# Patient Record
Sex: Female | Born: 1970 | Race: Black or African American | Hispanic: No | Marital: Single | Smoking: Never smoker
Health system: Southern US, Community
[De-identification: ages and names within clinical notes are randomized; demographics above are authoritative.]

## PROBLEM LIST (undated history)

## (undated) DIAGNOSIS — M25569 Pain in unspecified knee: Secondary | ICD-10-CM

## (undated) DIAGNOSIS — D649 Anemia, unspecified: Secondary | ICD-10-CM

## (undated) DIAGNOSIS — J449 Chronic obstructive pulmonary disease, unspecified: Secondary | ICD-10-CM

## (undated) DIAGNOSIS — M5030 Other cervical disc degeneration, unspecified cervical region: Secondary | ICD-10-CM

## (undated) DIAGNOSIS — K219 Gastro-esophageal reflux disease without esophagitis: Secondary | ICD-10-CM

## (undated) DIAGNOSIS — G9001 Carotid sinus syncope: Secondary | ICD-10-CM

## (undated) DIAGNOSIS — R569 Unspecified convulsions: Secondary | ICD-10-CM

## (undated) DIAGNOSIS — F8081 Childhood onset fluency disorder: Secondary | ICD-10-CM

## (undated) DIAGNOSIS — E041 Nontoxic single thyroid nodule: Secondary | ICD-10-CM

## (undated) DIAGNOSIS — I10 Essential (primary) hypertension: Secondary | ICD-10-CM

## (undated) DIAGNOSIS — J45909 Unspecified asthma, uncomplicated: Secondary | ICD-10-CM

## (undated) HISTORY — DX: Chronic obstructive pulmonary disease, unspecified: J44.9

## (undated) HISTORY — PX: TUBAL LIGATION: SHX77

## (undated) HISTORY — DX: Childhood onset fluency disorder: F80.81

## (undated) HISTORY — PX: WISDOM TOOTH EXTRACTION: SHX21

## (undated) HISTORY — PX: KNEE ARTHROSCOPY: SUR90

---

## 2002-06-17 ENCOUNTER — Inpatient Hospital Stay (HOSPITAL_COMMUNITY): Admission: EM | Admit: 2002-06-17 | Discharge: 2002-06-21 | Payer: Self-pay | Admitting: Emergency Medicine

## 2002-06-17 ENCOUNTER — Encounter: Payer: Self-pay | Admitting: Emergency Medicine

## 2004-03-22 ENCOUNTER — Ambulatory Visit: Payer: Self-pay | Admitting: Family Medicine

## 2004-06-26 ENCOUNTER — Emergency Department (HOSPITAL_COMMUNITY): Admission: EM | Admit: 2004-06-26 | Discharge: 2004-06-26 | Payer: Self-pay | Admitting: Emergency Medicine

## 2004-07-20 ENCOUNTER — Ambulatory Visit: Payer: Self-pay | Admitting: Family Medicine

## 2004-10-27 ENCOUNTER — Ambulatory Visit: Payer: Self-pay | Admitting: Family Medicine

## 2005-01-24 ENCOUNTER — Emergency Department (HOSPITAL_COMMUNITY): Admission: EM | Admit: 2005-01-24 | Discharge: 2005-01-24 | Payer: Self-pay | Admitting: Emergency Medicine

## 2005-02-26 ENCOUNTER — Ambulatory Visit: Payer: Self-pay | Admitting: Family Medicine

## 2006-06-05 ENCOUNTER — Emergency Department (HOSPITAL_COMMUNITY): Admission: EM | Admit: 2006-06-05 | Discharge: 2006-06-05 | Payer: Self-pay | Admitting: Emergency Medicine

## 2006-06-07 ENCOUNTER — Ambulatory Visit: Payer: Self-pay | Admitting: Family Medicine

## 2006-06-11 ENCOUNTER — Ambulatory Visit: Payer: Self-pay | Admitting: Family Medicine

## 2007-06-25 ENCOUNTER — Emergency Department (HOSPITAL_COMMUNITY): Admission: EM | Admit: 2007-06-25 | Discharge: 2007-06-25 | Payer: Self-pay | Admitting: Emergency Medicine

## 2008-11-23 ENCOUNTER — Emergency Department (HOSPITAL_COMMUNITY): Admission: EM | Admit: 2008-11-23 | Discharge: 2008-11-23 | Payer: Self-pay | Admitting: Emergency Medicine

## 2009-01-07 ENCOUNTER — Emergency Department (HOSPITAL_COMMUNITY): Admission: EM | Admit: 2009-01-07 | Discharge: 2009-01-07 | Payer: Self-pay | Admitting: Emergency Medicine

## 2009-08-03 ENCOUNTER — Emergency Department (HOSPITAL_COMMUNITY): Admission: EM | Admit: 2009-08-03 | Discharge: 2009-08-03 | Payer: Self-pay | Admitting: Emergency Medicine

## 2009-08-05 ENCOUNTER — Emergency Department (HOSPITAL_COMMUNITY): Admission: EM | Admit: 2009-08-05 | Discharge: 2009-08-05 | Payer: Self-pay | Admitting: Emergency Medicine

## 2009-08-07 ENCOUNTER — Emergency Department (HOSPITAL_COMMUNITY): Admission: EM | Admit: 2009-08-07 | Discharge: 2009-08-07 | Payer: Self-pay | Admitting: Emergency Medicine

## 2009-09-02 ENCOUNTER — Emergency Department (HOSPITAL_COMMUNITY): Admission: EM | Admit: 2009-09-02 | Discharge: 2009-09-02 | Payer: Self-pay | Admitting: Emergency Medicine

## 2009-09-03 ENCOUNTER — Encounter: Payer: Self-pay | Admitting: Cardiology

## 2009-09-03 ENCOUNTER — Emergency Department (HOSPITAL_COMMUNITY): Admission: EM | Admit: 2009-09-03 | Discharge: 2009-09-03 | Payer: Self-pay | Admitting: Emergency Medicine

## 2009-11-24 ENCOUNTER — Emergency Department (HOSPITAL_COMMUNITY): Admission: EM | Admit: 2009-11-24 | Discharge: 2009-11-24 | Payer: Self-pay | Admitting: Emergency Medicine

## 2009-11-26 ENCOUNTER — Emergency Department (HOSPITAL_COMMUNITY): Admission: EM | Admit: 2009-11-26 | Discharge: 2009-11-26 | Payer: Self-pay | Admitting: Emergency Medicine

## 2009-11-28 ENCOUNTER — Emergency Department (HOSPITAL_COMMUNITY): Admission: EM | Admit: 2009-11-28 | Discharge: 2009-11-28 | Payer: Self-pay | Admitting: Emergency Medicine

## 2010-01-30 ENCOUNTER — Encounter: Payer: Self-pay | Admitting: Cardiology

## 2010-01-30 ENCOUNTER — Emergency Department (HOSPITAL_COMMUNITY): Admission: EM | Admit: 2010-01-30 | Discharge: 2010-01-31 | Payer: Self-pay | Admitting: Emergency Medicine

## 2010-01-31 ENCOUNTER — Encounter: Payer: Self-pay | Admitting: Cardiology

## 2010-02-02 ENCOUNTER — Encounter: Payer: Self-pay | Admitting: Cardiology

## 2010-03-01 DIAGNOSIS — I10 Essential (primary) hypertension: Secondary | ICD-10-CM | POA: Insufficient documentation

## 2010-03-02 ENCOUNTER — Ambulatory Visit: Payer: Self-pay | Admitting: Cardiology

## 2010-03-02 ENCOUNTER — Encounter (INDEPENDENT_AMBULATORY_CARE_PROVIDER_SITE_OTHER): Payer: Self-pay | Admitting: *Deleted

## 2010-03-02 DIAGNOSIS — R079 Chest pain, unspecified: Secondary | ICD-10-CM | POA: Insufficient documentation

## 2010-03-02 DIAGNOSIS — R55 Syncope and collapse: Secondary | ICD-10-CM | POA: Insufficient documentation

## 2010-03-03 ENCOUNTER — Encounter: Payer: Self-pay | Admitting: Cardiology

## 2010-03-03 ENCOUNTER — Ambulatory Visit: Payer: Self-pay | Admitting: Cardiology

## 2010-03-08 ENCOUNTER — Emergency Department (HOSPITAL_COMMUNITY): Admission: EM | Admit: 2010-03-08 | Discharge: 2010-03-08 | Payer: Self-pay | Admitting: Emergency Medicine

## 2010-03-23 ENCOUNTER — Emergency Department (HOSPITAL_COMMUNITY): Admission: EM | Admit: 2010-03-23 | Discharge: 2010-03-23 | Payer: Self-pay | Admitting: Emergency Medicine

## 2010-03-27 ENCOUNTER — Ambulatory Visit: Payer: Self-pay | Admitting: Cardiology

## 2010-04-24 ENCOUNTER — Encounter: Payer: Self-pay | Admitting: Cardiology

## 2010-05-02 ENCOUNTER — Ambulatory Visit: Payer: Self-pay | Admitting: Cardiology

## 2010-06-13 NOTE — Letter (Signed)
Summary: Pharmacist, community at [facility]. [address]   [place], [place] [address]   Phone: [phone]  Fax: [phone]      [facility] Cardiovascular Services  Cardiolite Stress Test     Brittany Durham  Your doctor has ordered a Cardiolite Stress Test to help determine the condition of your heart during stress. If you take blood pressure medicine, ask your doctor if you should take it the day of your test. You should not have anything to eat or drink at least 4 hours before your test is scheduled, and no caffeine (coffee, tea, decaf. or chocolate) for 24 hours before your test.  You will need to register at the Outpatient/Main Entrance at the hospital 30 minutes before your appointment time. It is a good idea to bring a copy of your order with you. They will direct you to the Diagnostic Imaging (Radiology) Department.  You will be asked to undress from the waist up and be given a hospital gown to wear, so dress comfortably from the waist down, for example:    Sweat pants, shorts or skirt   Rubber-soled lace up shoes (i.e. tennis shoes)  Plan on about three hours from registration to release from the hospital.    Date of Test:              Time of Test              Hold Metoprolol for 24 hours before test. You may take your other medications with water the morning of your test.

## 2010-06-13 NOTE — Assessment & Plan Note (Signed)
Summary: 3 WK F/U PER 10/20 OV-JM   Visit Type:  Follow-up Primary Provider:  Dr. Joette Catching   History of Present Illness: 40 year old woman presents for followup. She was seen back in October and referred for cardiac structural and ischemic testing, reviewed below. She had been experiencing intermittent episodes of "sharp" chest pain, fairly sudden in onset, and also with brief "pass out" spells. Since that time she has had one additional episode of chest pain, although no syncope. Her cardiac testing was quite reassuring. I discussed this with her today. We also mentioned the possibility of cardiac monitoring at the last visit to exclude any significant arrhythmias. She states that she was just recently seen in the emergency department on Friday related to her current symptoms.  Preventive Screening-Counseling & Management  Alcohol-Tobacco     Smoking Status: quit     Year Quit: 1991  Current Medications (verified): 1)  Lisinopril 20 Mg Tabs (Lisinopril) .... Take 1 Tablet By Mouth Once A Day 2)  Metoprolol Succinate 25 Mg Xr24h-Tab (Metoprolol Succinate) .... Take 1 Tablet By Mouth Once A Day 3)  Norco 5-325 Mg Tabs (Hydrocodone-Acetaminophen) .... As Needed  Allergies (verified): No Known Drug Allergies  Comments:  Nurse/Medical Assistant: The patient's medication bottles and allergies were reviewed with the patient and were updated in the Medication and Allergy Lists.  Past History:  Past Medical History: Last updated: 03/02/2010 HTN GERD Asthma UTI  Social History: Last updated: 03/02/2010 Alcohol Use - no Drug Use - no Has two daughters Full Time - [employer]  Review of Systems  The patient denies anorexia, fever, chest pain, dyspnea on exertion, peripheral edema, prolonged cough, headaches, melena, hematochezia, and severe indigestion/heartburn.         Otherwise reviewed and negative.  Vital Signs:  Patient profile:   40 year old female Height:      67  inches Weight:      182 pounds Pulse rate:   83 / minute BP sitting:   144 / 93  (left arm) Cuff size:   regular  Vitals Entered By: Carlye Grippe (March 27, 2010 2:33 PM)  Physical Exam  Additional Exam:  Mildly overweight woman in no acute distress. HEENT: Conjunctiva and lids normal, oropharynx clear. Neck: Supple, no elevated JVP or bruits. Lungs: Clear to auscultation, nonlabored. Cardiac: Regular rate and rhythm, possible soft S4, no pathologic systolic murmur or pericardial rub, no S3. Abdomen: Soft, nontender, bowel sounds present. Skin: Warm and dry. Musculoskeletal: No kyphosis. Extremity: No pitting edema. Distal pulses full. Neuropsychiatric: Alert and oriented x3, affect appropriate.    Echocardiogram  Procedure date:  03/03/2010  Findings:      LVEF 55-60% with no regional wall motion abnormalities, no significant valvular abnormalities.  Nuclear Study  Procedure date:  03/03/2010  Findings:      Exercise Cardiolite with no diagnostic ST segment changes or chest pain. Perfusion imaging was normal without evidence of scar or ischemia, LVEF 54%.  Impression & Recommendations:  Problem # 1:  CHEST PAIN UNSPECIFIED (ICD-786.50)  Atypical, recurrent however. Recent cardiac testing was reassuring showing normal left ventricular systolic function without major valvular abnormalities, and no evidence of ischemia. Possibility of a sudden onset arrhythmia is still be considered, particularly in light of her prior reported episodes of "pass out" spells. An event recorder will be arranged and I will see her back to go over these results.  Her updated medication list for this problem includes:    Lisinopril 20 Mg Tabs (  Lisinopril) .Marland Kitchen... Take 1 tablet by mouth once a day    Metoprolol Succinate 25 Mg Xr24h-tab (Metoprolol succinate) .Marland Kitchen... Take 1 tablet by mouth once a day  Orders: Cardionet/Event Monitor (Cardionet/Event)  Problem # 2:  HYPERTENSION, BENIGN  (ICD-401.1)  Blood pressure elevated today. She reports followup pending with Dr. Lysbeth Galas later this month.  Her updated medication list for this problem includes:    Lisinopril 20 Mg Tabs (Lisinopril) .Marland Kitchen... Take 1 tablet by mouth once a day    Metoprolol Succinate 25 Mg Xr24h-tab (Metoprolol succinate) .Marland Kitchen... Take 1 tablet by mouth once a day  Patient Instructions: 1)  Follow up with Dr. Diona Browner on Tuesday, May 02, 2010 at 2pm. 2)  Your physician has recommended that you wear an event monitor.  Event monitors are medical devices that record the heart's electrical activity. Doctors most often use these monitors to diagnose arrhythmias. Arrhythmias are problems with the speed or rhythm of the heartbeat. The monitor is a small, portable device. You can wear one while you do your normal daily activities. This is usually used to diagnose what is causing palpitations/syncope (passing out). 3)  Your physician recommends that you continue on your current medications as directed. Please refer to the Current Medication list given to you today.

## 2010-06-13 NOTE — Assessment & Plan Note (Signed)
Summary: NP-CHEST PAIN -STRESS TEST   Visit Type:  Initial Consult Primary Provider:  Benetta Spar, NP [facility])   History of Present Illness: 40 year old woman referred for cardiology consultation. She reports a one year history of episodic chest pain, dyspnea, and brief "passouts." Chest pain is described as "sharp," moderate to severe, sporatic, not clearly exertional. No baseline dyspnea on exertion. Uses an over the counter MDI for asthma. The possible syncopal spells are more difficult to characterize - fairly sudden, no obvious palpitations.  Records indicate that she was seen in the [facility] ED in September with chest pain. Cardiac markers normal and chest CT noted below without acute findings. ECG from 19 September reviewed showing sinus rhythm with PAC's and nonspecific ST changes.  She has not undergone any formal ischemic testing or structural cardiac assessment as yet. We discussed this today.    Preventive Screening-Counseling & Management  Alcohol-Tobacco     Smoking Status: quit     Year Quit: 1991  Current Medications (verified): 1)  Lisinopril 20 Mg Tabs (Lisinopril) .... Take 1 Tablet By Mouth Once A Day 2)  Metoprolol Succinate 25 Mg Xr24h-Tab (Metoprolol Succinate) .... Take 1 Tablet By Mouth Once A Day  Allergies (verified): No Known Drug Allergies  Past History:  Family History: Last updated: 03/02/2010 Noncontributory  Social History: Last updated: 03/02/2010 Alcohol Use - no Drug Use - no Has two daughters Full Time - [employer]  Past Medical History: HTN GERD Asthma UTI  Past Surgical History: Tubal ligation Foot surgery  Family History: Noncontributory  Social History: Alcohol Use - no Drug Use - no Has two daughters Full Time - [employer] Smoking Status:  quit  Review of Systems  The patient denies anorexia, fever, weight loss, dyspnea on exertion, peripheral edema, prolonged cough, headaches, hemoptysis, abdominal pain, melena, hematochezia,  and severe indigestion/heartburn.         Otherwise reviewed and negative except as outlined above.  Vital Signs:  Patient profile:   40 year old female Height:      67 inches Weight:      181 pounds BMI:     28.45 Pulse rate:   91 / minute BP sitting:   147 / 96  (left arm) Cuff size:   large  Vitals Entered By: Carlye Grippe (March 02, 2010 1:45 PM)  Nutrition Counseling: Patient's BMI is greater than 25 and therefore counseled on weight management options.  Physical Exam  Additional Exam:  Mildly overweight woman in no acute distress. HEENT: Conjunctiva and lids normal, oropharynx clear. Neck: Supple, no elevated JVP or bruits. Lungs: Clear to auscultation, nonlabored. Cardiac: Regular rate and rhythm, possible soft S4, no pathologic systolic murmur or pericardial rub, no S3. Abdomen: Soft, nontender, bowel sounds present. Skin: Warm and dry. Musculoskeletal: No kyphosis. Extremity: No pitting edema. Distal pulses full. Neuropsychiatric: Alert and oriented x3, affect appropriate.    CT Scan  Procedure date:  01/31/2010  Findings:       Findings:  There is no evidence of pulmonary embolus.    Minimal bibasilar atelectasis is noted.  The lungs are otherwise   clear.  There is no evidence of significant focal consolidation,   pleural effusion or pneumothorax.  No masses are identified; no   abnormal focal contrast enhancement is seen.    The mediastinum is unremarkable in appearance.  Residual thymic   tissue is within normal limits.  The great vessels are unremarkable   in appearance.  No mediastinal lymphadenopathy  is seen.  No   pericardial effusion is identified.  No axillary lymphadenopathy is   seen.  The thyroid gland is unremarkable in appearance.    The visualized portions of the liver and spleen are unremarkable.   The visualized portions of the pancreas, gallbladder, adrenal   glands and kidneys are within normal limits. A tiny hiatal hernia   is  noted.    No acute osseous abnormalities are seen.    Review of the MIP images confirms the above findings.    IMPRESSION:    1.  No evidence of pulmonary embolus.   2.  Minimal bibasilar atelectasis; lungs otherwise clear.   3.  Tiny hiatal hernia noted.   Impression & Recommendations:  Problem # 1:  CHEST PAIN UNSPECIFIED (ICD-786.50)  One-year history of recurrent chest pain, also sensation of shortness of breath, not necessarily exertional, as described above. Assessment to date has been reassuring, including baseline ECG, cardiac markers, CT angiography of the chest. She has not however undergone any formal ischemic testing or structural cardiac assessment. We discussed this today, and plan an exercise Cardiolite, off metoprolol, with 2-D echocardiogram as well. I will bring her back to the office to discuss the results.  Her updated medication list for this problem includes:    Lisinopril 20 Mg Tabs (Lisinopril) .Marland Kitchen... Take 1 tablet by mouth once a day    Metoprolol Succinate 25 Mg Xr24h-tab (Metoprolol succinate) .Marland Kitchen... Take 1 tablet by mouth once a day  Orders: 2-D Echocardiogram (2D Echo) Nuclear Med (Nuc Med)  Problem # 2:  SYNCOPE (ICD-780.2)  Possible syncope, outlined above. Description of events is somewhat vague. Further cardiac structural and ischemic testing is pending. We may need to consider an event monitor.  Her updated medication list for this problem includes:    Lisinopril 20 Mg Tabs (Lisinopril) .Marland Kitchen... Take 1 tablet by mouth once a day    Metoprolol Succinate 25 Mg Xr24h-tab (Metoprolol succinate) .Marland Kitchen... Take 1 tablet by mouth once a day  Her updated medication list for this problem includes:    Lisinopril 20 Mg Tabs (Lisinopril) .Marland Kitchen... Take 1 tablet by mouth once a day    Metoprolol Succinate 25 Mg Xr24h-tab (Metoprolol succinate) .Marland Kitchen... Take 1 tablet by mouth once a day  Orders: 2-D Echocardiogram (2D Echo) Nuclear Med (Nuc Med)  Problem # 3:   HYPERTENSION, BENIGN (ICD-401.1)  Blood pressure elevated today. Medications are reviewed. May need further medication titration. Sodium restriction. Continue followup with primary care provider.  Her updated medication list for this problem includes:    Lisinopril 20 Mg Tabs (Lisinopril) .Marland Kitchen... Take 1 tablet by mouth once a day    Metoprolol Succinate 25 Mg Xr24h-tab (Metoprolol succinate) .Marland Kitchen... Take 1 tablet by mouth once a day  Patient Instructions: 1)  Follow up with Dr. Diona Browner on Monday, March 27, 2010 at 2:40pm.  2)  Your physician has requested that you have an echocardiogram.  Echocardiography is a painless test that uses sound waves to create images of your heart. It provides your doctor with information about the size and shape of your heart and how well your heart's chambers and valves are working.  This procedure takes approximately one hour. There are no restrictions for this procedure. 3)  Your physician has requested that you have an exercise stress cardiolite.  For further information please visit [email or web address]  Please follow instruction sheet, as given.

## 2010-06-13 NOTE — Letter (Signed)
Summary: External Correspondence/ [facility]  External Correspondence/ [facility]   Imported By: Dorise Hiss 02/07/2010 15:50:10  _____________________________________________________________________  External Attachment:    Type:   Image     Comment:   External Document

## 2010-06-15 NOTE — Assessment & Plan Note (Signed)
Summary: 1 MONTH F/U PER 11/14 OV-JM   Visit Type:  Follow-up Primary Provider:  Dr. Joette Catching   History of Present Illness: 40 year old woman presents for follow-up. She was seen back in November. She reports no progressive chest pain, and no episodes of syncope.  Cardiac monitor was ordered to further assess possible syncopal events. No significant arrhythmias were noted. We discussed this today.  I reviewed with her the results of her stress test, showing reassuring findings. Plan at this point will be to continue regular followup with Dr. Lysbeth Galas.  We can see her back as needed.  Preventive Screening-Counseling & Management  Alcohol-Tobacco     Smoking Status: quit     Year Quit: 1991  Current Medications (verified): 1)  Lisinopril 20 Mg Tabs (Lisinopril) .... Take 1 Tablet By Mouth Once A Day 2)  Metoprolol Succinate 25 Mg Xr24h-Tab (Metoprolol Succinate) .... Take 1 Tablet By Mouth Once A Day 3)  Norco 5-325 Mg Tabs (Hydrocodone-Acetaminophen) .... As Needed  Allergies (verified): No Known Drug Allergies  Comments:  Nurse/Medical Assistant: The patient's medication bottles and allergies were reviewed with the patient and were updated in the Medication and Allergy Lists.  Past History:  Past Medical History: Last updated: 03/02/2010 HTN GERD Asthma UTI  Social History: Last updated: 03/02/2010 Alcohol Use - no Drug Use - no Has two daughters Full Time - [employer]  Review of Systems  The patient denies anorexia, fever, weight loss, syncope, dyspnea on exertion, peripheral edema, melena, and hematochezia.         Otherwise reviewed and negative.  Vital Signs:  Patient profile:   40 year old female Height:      67 inches Weight:      186 pounds Pulse rate:   82 / minute BP sitting:   134 / 94  (left arm) Cuff size:   regular  Vitals Entered By: Carlye Grippe (May 02, 2010 2:02 PM)  Physical Exam  Additional Exam:  Mildly overweight woman in no  acute distress. HEENT: Conjunctiva and lids normal, oropharynx clear. Neck: Supple, no elevated JVP or bruits. Lungs: Clear to auscultation, nonlabored. Cardiac: Regular rate and rhythm, possible soft S4, no pathologic systolic murmur or pericardial rub, no S3. Abdomen: Soft, nontender, bowel sounds present. Skin: Warm and dry. Extremities: No pitting.    Prior Report Reviewed for Nuclear Study:  Findings: 03/03/2010 Exercise Cardiolite with no diagnostic ST segment changes or chest pain. Perfusion imaging was normal without evidence of scar or ischemia, LVEF 54%.  Impression & Recommendations:  Problem # 1:  SYNCOPE (ICD-780.2)  Not recurrent. Cardiac monitoring did not reveal any definite dysrhythmias, patient states she had no symptoms while wearing the monitor. I asked her to stay observant for any changes, otherwise followup with Dr. Lysbeth Galas. We can see her back as needed.  Her updated medication list for this problem includes:    Lisinopril 20 Mg Tabs (Lisinopril) .Marland Kitchen... Take 1 tablet by mouth once a day    Metoprolol Succinate 25 Mg Xr24h-tab (Metoprolol succinate) .Marland Kitchen... Take 1 tablet by mouth once a day  Problem # 2:  CHEST PAIN UNSPECIFIED (ICD-786.50)  Normal stress testing as outlined above.  Her updated medication list for this problem includes:    Lisinopril 20 Mg Tabs (Lisinopril) .Marland Kitchen... Take 1 tablet by mouth once a day    Metoprolol Succinate 25 Mg Xr24h-tab (Metoprolol succinate) .Marland Kitchen... Take 1 tablet by mouth once a day  Patient Instructions: 1)  Follow up as needed.

## 2010-06-15 NOTE — Procedures (Signed)
Summary: Event Monitor-final summary  Event Monitor-final summary   Imported By: Cyril Loosen, RN, BSN 04/24/2010 10:18:51  _____________________________________________________________________  External Attachment:    Type:   Image     Comment:   External Document  Appended Document: Event Monitor-final summary Pt will be notified of results at office visit on 12/20 with Dr. Diona Browner.

## 2010-06-19 ENCOUNTER — Emergency Department (HOSPITAL_COMMUNITY): Payer: Medicaid Other

## 2010-06-19 ENCOUNTER — Emergency Department (HOSPITAL_COMMUNITY)
Admission: EM | Admit: 2010-06-19 | Discharge: 2010-06-19 | Disposition: A | Discharge status: Home / Self Care | Payer: Medicaid Other | Attending: Emergency Medicine | Admitting: Emergency Medicine

## 2010-06-19 DIAGNOSIS — I10 Essential (primary) hypertension: Secondary | ICD-10-CM | POA: Insufficient documentation

## 2010-06-19 DIAGNOSIS — R079 Chest pain, unspecified: Secondary | ICD-10-CM | POA: Insufficient documentation

## 2010-06-19 LAB — DIFFERENTIAL
Basophils Absolute: 0 10*3/uL (ref 0.0–0.1)
Basophils Relative: 0 % (ref 0–1)
Eosinophils Absolute: 0 10*3/uL (ref 0.0–0.7)
Eosinophils Relative: 1 % (ref 0–5)
Lymphocytes Relative: 32 % (ref 12–46)
Lymphs Abs: 1.7 10*3/uL (ref 0.7–4.0)
Monocytes Absolute: 0.3 10*3/uL (ref 0.1–1.0)
Monocytes Relative: 6 % (ref 3–12)
Neutro Abs: 3.2 10*3/uL (ref 1.7–7.7)
Neutrophils Relative %: 61 % (ref 43–77)

## 2010-06-19 LAB — BASIC METABOLIC PANEL
BUN: 9 mg/dL (ref 6–23)
CO2: 26 mEq/L (ref 19–32)
Calcium: 9.6 mg/dL (ref 8.4–10.5)
Chloride: 105 mEq/L (ref 96–112)
Creatinine, Ser: 0.68 mg/dL (ref 0.4–1.2)
GFR calc Af Amer: >60 mL/min (ref >60)
GFR calc non Af Amer: >60 mL/min (ref >60)
Glucose, Bld: 93 mg/dL (ref 70–99)
Potassium: 3.5 mEq/L (ref 3.5–5.1)
Sodium: 139 mEq/L (ref 135–145)

## 2010-06-19 LAB — CBC
HCT: 34.7 % — ABNORMAL LOW (ref 36.0–46.0)
Hemoglobin: 12.3 g/dL (ref 12.0–15.0)
MCH: 31.1 pg (ref 26.0–34.0)
MCHC: 35.4 g/dL (ref 30.0–36.0)
MCV: 87.6 fL (ref 78.0–100.0)
Platelets: 180 10*3/uL (ref 150–400)
RBC: 3.96 MIL/uL (ref 3.87–5.11)
RDW: 12.2 % (ref 11.5–15.5)
WBC: 5.3 10*3/uL (ref 4.0–10.5)

## 2010-06-19 LAB — POCT CARDIAC MARKERS
CKMB, poc: 3.5 ng/mL (ref 1.0–8.0)
Myoglobin, poc: 95.8 ng/mL (ref 12–200)
Troponin i, poc: <0.05 ng/mL (ref 0.00–0.09)

## 2010-07-25 LAB — BASIC METABOLIC PANEL WITH GFR
BUN: 17 mg/dL (ref 6–23)
CO2: 27 meq/L (ref 19–32)
Calcium: 9.5 mg/dL (ref 8.4–10.5)
Chloride: 105 meq/L (ref 96–112)
Creatinine, Ser: 0.74 mg/dL (ref 0.4–1.2)
GFR calc non Af Amer: >60 mL/min
Glucose, Bld: 86 mg/dL (ref 70–99)
Potassium: 4.3 meq/L (ref 3.5–5.1)
Sodium: 141 meq/L (ref 135–145)

## 2010-07-25 LAB — DIFFERENTIAL
Basophils Absolute: 0 10*3/uL (ref 0.0–0.1)
Basophils Relative: 1 % (ref 0–1)
Eosinophils Absolute: 0.1 10*3/uL (ref 0.0–0.7)
Eosinophils Relative: 1 % (ref 0–5)
Lymphocytes Relative: 40 % (ref 12–46)
Lymphs Abs: 2.6 10*3/uL (ref 0.7–4.0)
Monocytes Absolute: 0.4 10*3/uL (ref 0.1–1.0)
Monocytes Relative: 7 % (ref 3–12)
Neutro Abs: 3.3 10*3/uL (ref 1.7–7.7)
Neutrophils Relative %: 51 % (ref 43–77)

## 2010-07-25 LAB — CBC
HCT: 33.7 % — ABNORMAL LOW (ref 36.0–46.0)
Hemoglobin: 11.5 g/dL — ABNORMAL LOW (ref 12.0–15.0)
MCH: 31.2 pg (ref 26.0–34.0)
MCHC: 34.1 g/dL (ref 30.0–36.0)
MCV: 91.5 fL (ref 78.0–100.0)
Platelets: 184 K/uL (ref 150–400)
RBC: 3.69 MIL/uL — ABNORMAL LOW (ref 3.87–5.11)
RDW: 12.7 % (ref 11.5–15.5)
WBC: 6.5 K/uL (ref 4.0–10.5)

## 2010-07-25 LAB — POCT CARDIAC MARKERS
CKMB, poc: 1.9 ng/mL (ref 1.0–8.0)
CKMB, poc: 2.2 ng/mL (ref 1.0–8.0)
Myoglobin, poc: 48.6 ng/mL (ref 12–200)
Myoglobin, poc: 63.2 ng/mL (ref 12–200)
Troponin i, poc: <0.05 ng/mL (ref 0.00–0.09)
Troponin i, poc: <0.05 ng/mL (ref 0.00–0.09)

## 2010-07-26 LAB — DIFFERENTIAL
Basophils Absolute: 0 10*3/uL (ref 0.0–0.1)
Basophils Relative: 0 % (ref 0–1)
Eosinophils Absolute: 0.1 10*3/uL (ref 0.0–0.7)
Eosinophils Relative: 1 % (ref 0–5)
Lymphocytes Relative: 26 % (ref 12–46)
Lymphs Abs: 1.7 10*3/uL (ref 0.7–4.0)
Monocytes Absolute: 0.5 10*3/uL (ref 0.1–1.0)
Monocytes Relative: 8 % (ref 3–12)
Neutro Abs: 4.3 10*3/uL (ref 1.7–7.7)
Neutrophils Relative %: 66 % (ref 43–77)

## 2010-07-26 LAB — CBC
HCT: 33.7 % — ABNORMAL LOW (ref 36.0–46.0)
Hemoglobin: 11.7 g/dL — ABNORMAL LOW (ref 12.0–15.0)
MCH: 31.5 pg (ref 26.0–34.0)
MCHC: 34.6 g/dL (ref 30.0–36.0)
MCV: 91.1 fL (ref 78.0–100.0)
Platelets: 170 10*3/uL (ref 150–400)
RBC: 3.7 MIL/uL — ABNORMAL LOW (ref 3.87–5.11)
RDW: 12.3 % (ref 11.5–15.5)
WBC: 6.5 10*3/uL (ref 4.0–10.5)

## 2010-07-26 LAB — URINALYSIS, ROUTINE W REFLEX MICROSCOPIC
Bilirubin Urine: NEGATIVE
Glucose, UA: NEGATIVE mg/dL
Ketones, ur: NEGATIVE mg/dL
Leukocytes, UA: NEGATIVE
Nitrite: NEGATIVE
Protein, ur: NEGATIVE mg/dL
Specific Gravity, Urine: 1.01 (ref 1.005–1.030)
Urobilinogen, UA: 0.2 mg/dL (ref 0.0–1.0)
pH: 6.5 (ref 5.0–8.0)

## 2010-07-26 LAB — COMPREHENSIVE METABOLIC PANEL
ALT: 17 U/L (ref 0–35)
AST: 20 U/L (ref 0–37)
Albumin: 4.1 g/dL (ref 3.5–5.2)
Alkaline Phosphatase: 51 U/L (ref 39–117)
BUN: 8 mg/dL (ref 6–23)
CO2: 24 mEq/L (ref 19–32)
Calcium: 9.5 mg/dL (ref 8.4–10.5)
Chloride: 108 mEq/L (ref 96–112)
Creatinine, Ser: 0.64 mg/dL (ref 0.4–1.2)
GFR calc Af Amer: >60 mL/min (ref >60)
GFR calc non Af Amer: >60 mL/min (ref >60)
Glucose, Bld: 101 mg/dL — ABNORMAL HIGH (ref 70–99)
Potassium: 3.7 mEq/L (ref 3.5–5.1)
Sodium: 140 mEq/L (ref 135–145)
Total Bilirubin: 0.6 mg/dL (ref 0.3–1.2)
Total Protein: 7.6 g/dL (ref 6.0–8.3)

## 2010-07-26 LAB — PREGNANCY, URINE: Preg Test, Ur: NEGATIVE

## 2010-07-26 LAB — URINE MICROSCOPIC-ADD ON

## 2010-07-27 LAB — DIFFERENTIAL
Basophils Absolute: 0 10*3/uL (ref 0.0–0.1)
Basophils Relative: 0 % (ref 0–1)
Eosinophils Absolute: 0.1 10*3/uL (ref 0.0–0.7)
Eosinophils Relative: 1 % (ref 0–5)
Lymphocytes Relative: 27 % (ref 12–46)
Lymphs Abs: 2 10*3/uL (ref 0.7–4.0)
Monocytes Absolute: 0.5 10*3/uL (ref 0.1–1.0)
Monocytes Relative: 6 % (ref 3–12)
Neutro Abs: 4.9 10*3/uL (ref 1.7–7.7)
Neutrophils Relative %: 66 % (ref 43–77)

## 2010-07-27 LAB — BASIC METABOLIC PANEL
BUN: 14 mg/dL (ref 6–23)
CO2: 25 mEq/L (ref 19–32)
Calcium: 9.3 mg/dL (ref 8.4–10.5)
Chloride: 110 mEq/L (ref 96–112)
Creatinine, Ser: 0.59 mg/dL (ref 0.4–1.2)
GFR calc Af Amer: >60 mL/min (ref >60)
GFR calc non Af Amer: >60 mL/min (ref >60)
Glucose, Bld: 102 mg/dL — ABNORMAL HIGH (ref 70–99)
Potassium: 3.7 mEq/L (ref 3.5–5.1)
Sodium: 140 mEq/L (ref 135–145)

## 2010-07-27 LAB — CBC
HCT: 32.6 % — ABNORMAL LOW (ref 36.0–46.0)
Hemoglobin: 11.6 g/dL — ABNORMAL LOW (ref 12.0–15.0)
MCH: 31.4 pg (ref 26.0–34.0)
MCHC: 35.6 g/dL (ref 30.0–36.0)
MCV: 88.1 fL (ref 78.0–100.0)
Platelets: 196 10*3/uL (ref 150–400)
RBC: 3.7 MIL/uL — ABNORMAL LOW (ref 3.87–5.11)
RDW: 12.2 % (ref 11.5–15.5)
WBC: 7.5 10*3/uL (ref 4.0–10.5)

## 2010-07-27 LAB — POCT CARDIAC MARKERS
CKMB, poc: 2 ng/mL (ref 1.0–8.0)
CKMB, poc: 2 ng/mL (ref 1.0–8.0)
Myoglobin, poc: 39.2 ng/mL (ref 12–200)
Myoglobin, poc: 58 ng/mL (ref 12–200)
Troponin i, poc: <0.05 ng/mL (ref 0.00–0.09)
Troponin i, poc: <0.05 ng/mL (ref 0.00–0.09)

## 2010-07-27 LAB — D-DIMER, QUANTITATIVE (NOT AT ARMC): D-Dimer, Quant: 0.49 ug/mL-FEU — ABNORMAL HIGH (ref 0.00–0.48)

## 2010-08-01 LAB — D-DIMER, QUANTITATIVE (NOT AT ARMC): D-Dimer, Quant: 0.27 ug/mL-FEU (ref 0.00–0.48)

## 2010-08-01 LAB — COMPREHENSIVE METABOLIC PANEL
ALT: 16 U/L (ref 0–35)
AST: 17 U/L (ref 0–37)
Albumin: 3.8 g/dL (ref 3.5–5.2)
Alkaline Phosphatase: 48 U/L (ref 39–117)
BUN: 17 mg/dL (ref 6–23)
CO2: 27 mEq/L (ref 19–32)
Calcium: 9.2 mg/dL (ref 8.4–10.5)
Chloride: 107 mEq/L (ref 96–112)
Creatinine, Ser: 0.59 mg/dL (ref 0.4–1.2)
GFR calc Af Amer: >60 mL/min (ref >60)
GFR calc non Af Amer: >60 mL/min (ref >60)
Glucose, Bld: 104 mg/dL — ABNORMAL HIGH (ref 70–99)
Potassium: 3.4 mEq/L — ABNORMAL LOW (ref 3.5–5.1)
Sodium: 138 mEq/L (ref 135–145)
Total Bilirubin: 0.6 mg/dL (ref 0.3–1.2)
Total Protein: 7 g/dL (ref 6.0–8.3)

## 2010-08-01 LAB — POCT CARDIAC MARKERS
CKMB, poc: 1.9 ng/mL (ref 1.0–8.0)
Myoglobin, poc: 71.4 ng/mL (ref 12–200)
Troponin i, poc: <0.05 ng/mL (ref 0.00–0.09)

## 2010-08-01 LAB — CBC
HCT: 31.6 % — ABNORMAL LOW (ref 36.0–46.0)
Hemoglobin: 11.4 g/dL — ABNORMAL LOW (ref 12.0–15.0)
MCHC: 36 g/dL (ref 30.0–36.0)
MCV: 90.5 fL (ref 78.0–100.0)
Platelets: 178 10*3/uL (ref 150–400)
RBC: 3.49 MIL/uL — ABNORMAL LOW (ref 3.87–5.11)
RDW: 12.6 % (ref 11.5–15.5)
WBC: 5.7 10*3/uL (ref 4.0–10.5)

## 2010-08-01 LAB — DIFFERENTIAL
Basophils Absolute: 0 10*3/uL (ref 0.0–0.1)
Basophils Relative: 0 % (ref 0–1)
Eosinophils Absolute: 0.2 10*3/uL (ref 0.0–0.7)
Eosinophils Relative: 3 % (ref 0–5)
Lymphocytes Relative: 32 % (ref 12–46)
Lymphs Abs: 1.8 10*3/uL (ref 0.7–4.0)
Monocytes Absolute: 0.4 10*3/uL (ref 0.1–1.0)
Monocytes Relative: 7 % (ref 3–12)
Neutro Abs: 3.3 10*3/uL (ref 1.7–7.7)
Neutrophils Relative %: 58 % (ref 43–77)

## 2010-08-19 LAB — DIFFERENTIAL
Basophils Absolute: 0 10*3/uL (ref 0.0–0.1)
Basophils Relative: 0 % (ref 0–1)
Eosinophils Absolute: 0.1 10*3/uL (ref 0.0–0.7)
Eosinophils Relative: 1 % (ref 0–5)
Lymphocytes Relative: 30 % (ref 12–46)
Lymphs Abs: 1.9 10*3/uL (ref 0.7–4.0)
Monocytes Absolute: 0.4 10*3/uL (ref 0.1–1.0)
Monocytes Relative: 7 % (ref 3–12)
Neutro Abs: 3.9 10*3/uL (ref 1.7–7.7)
Neutrophils Relative %: 61 % (ref 43–77)

## 2010-08-19 LAB — POCT CARDIAC MARKERS
CKMB, poc: 2 ng/mL (ref 1.0–8.0)
CKMB, poc: 2.3 ng/mL (ref 1.0–8.0)
Myoglobin, poc: 72.8 ng/mL (ref 12–200)
Myoglobin, poc: 73.2 ng/mL (ref 12–200)
Troponin i, poc: <0.05 ng/mL (ref 0.00–0.09)
Troponin i, poc: <0.05 ng/mL (ref 0.00–0.09)

## 2010-08-19 LAB — CBC
HCT: 37 % (ref 36.0–46.0)
Hemoglobin: 12.9 g/dL (ref 12.0–15.0)
MCHC: 34.9 g/dL (ref 30.0–36.0)
MCV: 92.7 fL (ref 78.0–100.0)
Platelets: 167 10*3/uL (ref 150–400)
RBC: 4 MIL/uL (ref 3.87–5.11)
RDW: 12.9 % (ref 11.5–15.5)
WBC: 6.3 10*3/uL (ref 4.0–10.5)

## 2010-08-19 LAB — COMPREHENSIVE METABOLIC PANEL
ALT: 18 U/L (ref 0–35)
AST: 21 U/L (ref 0–37)
Albumin: 4.5 g/dL (ref 3.5–5.2)
Alkaline Phosphatase: 56 U/L (ref 39–117)
BUN: 13 mg/dL (ref 6–23)
CO2: 27 mEq/L (ref 19–32)
Calcium: 10 mg/dL (ref 8.4–10.5)
Chloride: 106 mEq/L (ref 96–112)
Creatinine, Ser: 0.66 mg/dL (ref 0.4–1.2)
GFR calc Af Amer: >60 mL/min (ref >60)
GFR calc non Af Amer: >60 mL/min (ref >60)
Glucose, Bld: 104 mg/dL — ABNORMAL HIGH (ref 70–99)
Potassium: 3.1 mEq/L — ABNORMAL LOW (ref 3.5–5.1)
Sodium: 139 mEq/L (ref 135–145)
Total Bilirubin: 0.8 mg/dL (ref 0.3–1.2)
Total Protein: 7.8 g/dL (ref 6.0–8.3)

## 2010-08-20 LAB — PREGNANCY, URINE: Preg Test, Ur: NEGATIVE

## 2010-08-20 LAB — BASIC METABOLIC PANEL
BUN: 14 mg/dL (ref 6–23)
CO2: 30 mEq/L (ref 19–32)
Calcium: 9.8 mg/dL (ref 8.4–10.5)
Chloride: 108 mEq/L (ref 96–112)
Creatinine, Ser: 0.69 mg/dL (ref 0.4–1.2)
GFR calc Af Amer: >60 mL/min (ref >60)
GFR calc non Af Amer: >60 mL/min (ref >60)
Glucose, Bld: 117 mg/dL — ABNORMAL HIGH (ref 70–99)
Potassium: 3.1 mEq/L — ABNORMAL LOW (ref 3.5–5.1)
Sodium: 142 mEq/L (ref 135–145)

## 2010-08-20 LAB — URINALYSIS, ROUTINE W REFLEX MICROSCOPIC
Bilirubin Urine: NEGATIVE
Glucose, UA: NEGATIVE mg/dL
Ketones, ur: NEGATIVE mg/dL
Leukocytes, UA: NEGATIVE
Nitrite: NEGATIVE
Protein, ur: NEGATIVE mg/dL
Specific Gravity, Urine: <1.005 — ABNORMAL LOW (ref 1.005–1.030)
Urobilinogen, UA: 0.2 mg/dL (ref 0.0–1.0)
pH: 7 (ref 5.0–8.0)

## 2010-08-20 LAB — DIFFERENTIAL
Basophils Absolute: 0 10*3/uL (ref 0.0–0.1)
Basophils Relative: 0 % (ref 0–1)
Eosinophils Absolute: 0 10*3/uL (ref 0.0–0.7)
Eosinophils Relative: 1 % (ref 0–5)
Lymphocytes Relative: 19 % (ref 12–46)
Lymphs Abs: 1.2 10*3/uL (ref 0.7–4.0)
Monocytes Absolute: 0.3 10*3/uL (ref 0.1–1.0)
Monocytes Relative: 5 % (ref 3–12)
Neutro Abs: 4.8 10*3/uL (ref 1.7–7.7)
Neutrophils Relative %: 75 % (ref 43–77)

## 2010-08-20 LAB — CBC
HCT: 36.1 % (ref 36.0–46.0)
Hemoglobin: 12.8 g/dL (ref 12.0–15.0)
MCHC: 35.4 g/dL (ref 30.0–36.0)
MCV: 90.1 fL (ref 78.0–100.0)
Platelets: 167 10*3/uL (ref 150–400)
RBC: 4.01 MIL/uL (ref 3.87–5.11)
RDW: 12.4 % (ref 11.5–15.5)
WBC: 6.4 10*3/uL (ref 4.0–10.5)

## 2010-08-20 LAB — D-DIMER, QUANTITATIVE (NOT AT ARMC): D-Dimer, Quant: 0.76 ug/mL-FEU — ABNORMAL HIGH (ref 0.00–0.48)

## 2010-08-20 LAB — POCT CARDIAC MARKERS
CKMB, poc: 4.1 ng/mL (ref 1.0–8.0)
Myoglobin, poc: 118 ng/mL (ref 12–200)
Troponin i, poc: <0.05 ng/mL (ref 0.00–0.09)

## 2010-08-20 LAB — URINE MICROSCOPIC-ADD ON: RBC / HPF: NONE SEEN RBC/hpf (ref <3)

## 2010-09-26 ENCOUNTER — Emergency Department (HOSPITAL_COMMUNITY)
Admission: EM | Admit: 2010-09-26 | Discharge: 2010-09-26 | Disposition: A | Discharge status: Home / Self Care | Payer: Self-pay | Attending: Emergency Medicine | Admitting: Emergency Medicine

## 2010-09-26 ENCOUNTER — Emergency Department (HOSPITAL_COMMUNITY): Payer: Self-pay

## 2010-09-26 DIAGNOSIS — R079 Chest pain, unspecified: Secondary | ICD-10-CM | POA: Insufficient documentation

## 2010-09-26 DIAGNOSIS — R05 Cough: Secondary | ICD-10-CM | POA: Insufficient documentation

## 2010-09-26 DIAGNOSIS — I10 Essential (primary) hypertension: Secondary | ICD-10-CM | POA: Insufficient documentation

## 2010-09-26 DIAGNOSIS — Z79899 Other long term (current) drug therapy: Secondary | ICD-10-CM | POA: Insufficient documentation

## 2010-09-26 DIAGNOSIS — J45909 Unspecified asthma, uncomplicated: Secondary | ICD-10-CM | POA: Insufficient documentation

## 2010-09-26 DIAGNOSIS — R059 Cough, unspecified: Secondary | ICD-10-CM | POA: Insufficient documentation

## 2010-09-29 NOTE — H&P (Signed)
NAME:  Brittany Durham, Brittany Durham                           ACCOUNT NO.:  [id number]   MEDICAL RECORD NO.:  [id number]                   PATIENT TYPE:  EMS   LOCATION:  ED                                   FACILITY:  [facility]   PHYSICIAN:  Hanley Hays. Dechurch, M.D.           DATE OF BIRTH:  Nov 15, 1970   DATE OF ADMISSION:  06/17/2002  DATE OF DISCHARGE:                                HISTORY & PHYSICAL   HISTORY OF PRESENT ILLNESS:  The patient is a 40 year old African American  female with no significant past medical history who presents with a two-day  history of progressive lower abdominal pain that started in the mid lower  abdomen and now involves the whole abdomen.  She had some nausea today and  vomited in the emergency room.  She denies any change in her bowel habits.  She did have a stool today which was soft.  She has had no diarrhea, no  mucus, no blood.  She has been taking a regular diet, including wings this  morning for breakfast.  She notes the pain is worse with a solid bowel  movement.  She denies any GU complaints.  She denies any chills but has a  low-grade fever here in the emergency room and generally does not feel well.  She denies any headache or viral symptoms otherwise.  Her last menstrual  period was about a week ago, normal, and regular.  Urine pregnancy test in  the emergency room was negative.  She is sexually active.  She denies any  dyspareunia or vaginal discharge.  She usually does not have heartburn or  reflux symptoms but did note some today.   REVIEW OF SYSTEMS:  Otherwise, her review of systems is negative.   PAST SURGICAL HISTORY:  Gravida 1, para 1, A0 status post tubal ligation and  recent foot surgery on 05/15/2002.  Was on a pain medicine of unknown type.  She thinks it might be a nonsteroidal.   ALLERGIES:  NO DRUG ALLERGIES ARE KNOWN.   MEDICATIONS:  No other medications or over-the-counter products.   FAMILY HISTORY:  She has four siblings who are  alive and well.  Her parents  are living.  No chronic medical problems.  No GI, diabetes, coronary artery  disease.   SOCIAL HISTORY:  She is single.  She has a 15 year old daughter.  She is  unemployed.  She does have a significant other.  She denies any alcohol,  tobacco, or drug abuse.   PHYSICAL EXAMINATION:  GENERAL:  Examination reveals a well-developed, well-  nourished female in no distress.  VITAL SIGNS:  Blood pressure 150/87, temperature 100.7, pulse 120 and  regular, respirations normal and unlabored at 14.  NECK:  Supple.  There is no JVD, adenopathy, or thyromegaly.  HEENT:  Oropharynx is moist and without lesions.  CHEST:  Clear to auscultation.  HEART:  Regular rate and rhythm.  She  has a prominent S4.  No murmur is  noted.  ABDOMEN:  Protuberant with very active bowel sounds.  Is distended and  tympanitic to palpation.  Diffusely tender throughout.  No rebound is noted.  Rectal exam per the emergency room physician was negative with heme-negative  stool.  NEUROLOGIC:  Intact.  EXTREMITIES:  Without clubbing, cyanosis, or edema.  SKIN:  No rash or other skin lesions are noted.   ASSESSMENT AND PLAN:  Abdominal pain with fever, leukocytosis with left  shift, and nausea with bowel wall thickening noted on computed tomography  scan which is more diffuse than localized which would make it unlikely to be  appendicitis.  This raises the question of another type of colitis.  The  patient will be admitted with intravenous fluid and pain control and further  evaluation and bowel rest.  Gastrointestinal consultation in the morning.  She received a dose of Levaquin 500 mg in the emergency room.  No liver  function tests were done as of yet.  These are pending.  Will continue the  Levaquin at this point.  The plan was discussed with the patient who has a  reasonable understanding    Dictation ended at this point.                                               Hanley Hays  Josefine Class, M.D.    FED/MEDQ  D:  06/17/2002  T:  06/18/2002  Job:  [id number]

## 2010-09-29 NOTE — Consult Note (Signed)
NAME:  Brittany Durham, Brittany Durham                           ACCOUNT NO.:  [id number]   MEDICAL RECORD NO.:  [id number]                   PATIENT TYPE:  INP   LOCATION:  A321                                 FACILITY:  [facility]   PHYSICIAN:  Tommye Standard, M.D.                 DATE OF BIRTH:  01-17-1971   DATE OF CONSULTATION:  06/18/2002  DATE OF DISCHARGE:                                   CONSULTATION   GASTROENTEROLOGY CONSULTATION   REASON FOR CONSULTATION:  Abdominal pain, bowel wall thickening.   REFERRING PHYSICIAN:  Hanley Hays. Josefine Class, M.D.   HISTORY OF PRESENT ILLNESS:  The patient is a 40 year old black female with  no significant past medical history who was admitted with a three to four  day history of diffuse abdominal pain, nausea and vomiting.  She states she  ate some stew that she had thawed out from her freezer on Monday evening.  Within a couple of hours she developed diffuse abdominal pain.  Her  daughter, who is 53 years old, also ate the stew but did not get sick.  The  patient denies any diarrhea.  Her symptoms have progressed over the last  several days.  In the emergency department she was found to have a  temperature of 100.3.  Her white blood cell count was 11.1 with 82%  neutrophils, 11% lymphocytes.  Liver function tests, amylase, lipase were  all normal.  Urine pregnancy test was negative.  Urinalysis revealed trace  blood, few bacteria, 7 to 10 white blood cells.  At baseline she has  constipation usually has a bowel movement once a week.  She takes Correctol  PRN.  She did have a bowel movement two days ago as well as today.  She  denies any melena or bright red blood per rectum.  In the emergency  department she was hemoccult negative on rectal examination.  She denies any  weight loss, heartburn, dyspareunia, vaginal discharge, dysuria.   LABORATORY DATA:  CT scan of the abdomen and pelvis revealed bowel wall  thickening at cecum and ascending colon, possible  thickening of the  descending colon.  Terminal ileum was not well visualized but felt to be  thickened.  She had a small to moderate hiatal hernia.   The patient tells me she has been on pain medication and an antibiotic for  a foot infection.  She had surgery in January 2004.  Last week she was seen  in the emergency department and felt to have an infection and was started on  antibiotics.  She tells me she has one week left to take before following up  with her surgeon.  She is unsure of the name of the medication.   MEDICATIONS:  Prior to admission:  1. Pain medication.  2. Antibiotic.   ALLERGIES:  No known drug allergies.   PAST MEDICAL HISTORY:  No significant past medical  history.  She did have  right foot surgery on May 15, 2002.  The patient has also had a bilateral  tubal ligation.   FAMILY HISTORY:  Negative for chronic gastrointestinal illnesses or  colorectal cancer.   SOCIAL HISTORY:  The patient is single.  She has one daughter.  She is  unemployed.  She has never been a smoker and denies any alcohol use.   REVIEW OF SYMPTOMS:  Please see history of present illness for  GASTROINTESTINAL and for GENITOURINARY.  GENERAL:  Denies any weight loss.  CARDIOPULMONARY:  Denies any chest pain or shortness of breath.   PHYSICAL EXAMINATION:  VITAL SIGNS:  Height 67 inches.  Weight 172.4.  Temperature 97. Pulse 96. Respirations 20.  Blood pressure 145/81.  GENERAL:  Pleasant, well-nourished, well-developed black female in no acute  distress.  SKIN:  Warm and dry.  No jaundice.  HEENT:  Pupils equal, round, reactive to light. Conjunctiva pink.  Sclerae  nonicteric.  Oropharyngeal mucosa moist and pink.  No lesions, erythema or  exudates.  No lymphadenopathy or thyromegaly or carotid bruits.  CHEST:  Lungs clear to auscultation.  CARDIAC:  Examination reveals regular rate and rhythm, normal S1, S2. No  murmurs, rubs or gallops.  ABDOMEN:  Positive bowel sounds.   Nondistended.  Soft.  Diffuse moderate  tenderness. No hepatosplenomegaly or masses.  Rectal examination done in the  emergency department was found to be hemoccult negative.  EXTREMITIES:  No edema.   LABORATORY DATA:  As mentioned in history of present illness and in addition  the sodium was 138, potassium 3.6, BUN 10, creatinine 0.6, glucose 94.  Liver function tests were normal X two.  Amylase 67, lipase 28.  Platelet  count 179,000.   IMPRESSION:  The patient is a 40 year old black female with three to four  day history of progressive abdominal pain, nausea and vomiting but no  diarrhea.  The patient had a low grade temperature in the emergency room as  well as mild leukocytosis.  A CT scan shows bowel wall thickening of the  cecum and ascending colon as well as possibly the left colon and terminal  ileum.  The findings could be secondary to infectious colitis, although  somewhat unusual without diarrhea.  Clostridium difficile colitis remains a  possibility as well.  Inflammatory bowel disease, however, has not been  ruled out.   RECOMMENDATIONS:  1. CBC, sed rate.  2. Will initiate Cipro 400 mg intravenously q.12h. and Flagyl 500 mg     intravenously q.6h.  3. Patient is to give a list of her medications, may need to continue     antibiotic coverage for foot infection, discuss with Dr. Letitia Libra.  4. Continue Protonix 40 mg daily.  5. If the patient develops diarrhea will obtain Clostridium difficile, ova     and parasites, stool cultures, fecal white blood cells.  6. Possible small bowel follow through versus colonoscopy, will discuss     further with Dr. Dionicia Abler.   I would like to thank Dr. Josefine Class for allowing [place] to take part in the care  of this patient.     Tana Coast, P.A.                        Tommye Standard, M.D.    Dennison Nancy  D:  06/18/2002  T:  06/18/2002  Job:  [id number]

## 2010-09-29 NOTE — Discharge Summary (Signed)
NAME:  Brittany Durham, Brittany Durham                           ACCOUNT NO.:  [id number]   MEDICAL RECORD NO.:  [id number]                   PATIENT TYPE:  INP   LOCATION:  A321                                 FACILITY:  [facility]   PHYSICIAN:  Gracelyn Nurse, M.D.              DATE OF BIRTH:  January 07, 1971   DATE OF ADMISSION:  06/17/2002  DATE OF DISCHARGE:  06/21/2002                                 DISCHARGE SUMMARY   DISCHARGE DIAGNOSES:  1. Infectious colitis.     a. Stool studies negative.  2. Recent surgery on the right foot with infectious complication.   DISCHARGE MEDICATIONS:  1. Levaquin 500 mg daily x10 days.  2. Flagyl 500 mg t.i.d. x10 days.   CHIEF COMPLAINT:  This is a 40 year old black female who presents with some  mid-lower abdominal pain that has evolved now to be diffuse abdominal pain.  She has had some nausea today and vomiting.  She denies any diarrhea at this  time.  She recently had surgery on her foot and had a postoperative  infection which she is currently taking antibiotics for.  She reports no  fever or chills.   HOSPITAL COURSE:  #1 - INFECTIOUS COLITIS:  The patient had a CT scan in the  emergency room which revealed some areas of inflammation in the bowel wall,  with a differential infectious colitis, versus Crohn's disease.  She was  started on antibiotics and responded dramatically.  Stool studies were  negative so far; however, this was felt because of the sudden onset and  prompt response to antibiotics, that this was an infectious type.  She did  develop diarrhea on hospital day one, but this would have cleared up by the  time of discharge.  She is able to tolerate a regular diet.  She is to  finish a 10-day course of antibiotics.  She is to follow up with Dr. Dionicia Abler  on a p.r.n. basis, because if she has any further trouble, he wants to do a  colonoscopy.  She is also to follow up with her primary care physician in  two weeks.  #2 - RECENT FOOT SURGERY WITH  INFECTION:  She was on cephalexin when she  came in.  She is on Levaquin and Flagyl now for her GI symptoms.  The  Levaquin should cover the foot infection also.   DISPOSITION:  The patient is discharged in stable condition.    FOLLOW UP:  She is to follow up with Dr. Dionicia Abler on a p.r.n. basis, and she is  also to follow up with Dr. Colon Flattery, her primary care physician, in two  weeks.  Gracelyn Nurse, M.D.    JDJ/MEDQ  D:  06/21/2002  T:  06/21/2002  Job:  [id number]   cc:   Dr. Zollie Pee, M.D.

## 2010-11-10 ENCOUNTER — Emergency Department (HOSPITAL_COMMUNITY): Payer: Self-pay

## 2010-11-10 ENCOUNTER — Emergency Department (HOSPITAL_COMMUNITY)
Admission: EM | Admit: 2010-11-10 | Discharge: 2010-11-11 | Disposition: A | Discharge status: Home / Self Care | Payer: Self-pay | Attending: Emergency Medicine | Admitting: Emergency Medicine

## 2010-11-10 DIAGNOSIS — I10 Essential (primary) hypertension: Secondary | ICD-10-CM | POA: Insufficient documentation

## 2010-11-10 DIAGNOSIS — Z79899 Other long term (current) drug therapy: Secondary | ICD-10-CM | POA: Insufficient documentation

## 2010-11-10 DIAGNOSIS — R079 Chest pain, unspecified: Secondary | ICD-10-CM | POA: Insufficient documentation

## 2010-11-11 LAB — BASIC METABOLIC PANEL
BUN: 14 mg/dL (ref 6–23)
CO2: 25 mEq/L (ref 19–32)
Calcium: 9.8 mg/dL (ref 8.4–10.5)
Chloride: 102 mEq/L (ref 96–112)
Creatinine, Ser: 0.64 mg/dL (ref 0.50–1.10)
GFR calc Af Amer: >60 mL/min (ref >60)
GFR calc non Af Amer: >60 mL/min (ref >60)
Glucose, Bld: 103 mg/dL — ABNORMAL HIGH (ref 70–99)
Potassium: 3.4 mEq/L — ABNORMAL LOW (ref 3.5–5.1)
Sodium: 137 mEq/L (ref 135–145)

## 2010-11-11 LAB — TROPONIN I: Troponin I: <0.3 ng/mL (ref <0.30)

## 2010-11-11 LAB — DIFFERENTIAL
Basophils Absolute: 0 10*3/uL (ref 0.0–0.1)
Basophils Relative: 0 % (ref 0–1)
Eosinophils Absolute: 0.2 10*3/uL (ref 0.0–0.7)
Eosinophils Relative: 3 % (ref 0–5)
Lymphocytes Relative: 43 % (ref 12–46)
Lymphs Abs: 2.5 10*3/uL (ref 0.7–4.0)
Monocytes Absolute: 0.5 10*3/uL (ref 0.1–1.0)
Monocytes Relative: 9 % (ref 3–12)
Neutro Abs: 2.7 10*3/uL (ref 1.7–7.7)
Neutrophils Relative %: 46 % (ref 43–77)

## 2010-11-11 LAB — CBC
HCT: 34.5 % — ABNORMAL LOW (ref 36.0–46.0)
Hemoglobin: 12.1 g/dL (ref 12.0–15.0)
MCH: 31.1 pg (ref 26.0–34.0)
MCHC: 35.1 g/dL (ref 30.0–36.0)
MCV: 88.7 fL (ref 78.0–100.0)
Platelets: 195 10*3/uL (ref 150–400)
RBC: 3.89 MIL/uL (ref 3.87–5.11)
RDW: 12.4 % (ref 11.5–15.5)
WBC: 5.9 10*3/uL (ref 4.0–10.5)

## 2010-11-11 LAB — CK TOTAL AND CKMB (NOT AT ARMC)
CK, MB: 10.9 ng/mL (ref 0.3–4.0)
Relative Index: 1.6 (ref 0.0–2.5)
Total CK: 678 U/L — ABNORMAL HIGH (ref 7–177)

## 2011-02-02 LAB — URINE MICROSCOPIC-ADD ON

## 2011-02-02 LAB — DIFFERENTIAL
Basophils Absolute: 0
Basophils Relative: 0
Eosinophils Absolute: 0
Eosinophils Relative: 0
Lymphocytes Relative: 16
Lymphs Abs: 1.5
Monocytes Absolute: 0.5
Monocytes Relative: 5
Neutro Abs: 7.2
Neutrophils Relative %: 78 — ABNORMAL HIGH

## 2011-02-02 LAB — URINALYSIS, ROUTINE W REFLEX MICROSCOPIC
Bilirubin Urine: NEGATIVE
Glucose, UA: NEGATIVE
Ketones, ur: 15 — AB
Nitrite: NEGATIVE
Protein, ur: NEGATIVE
Specific Gravity, Urine: 1.015
Urobilinogen, UA: 0.2
pH: 7.5

## 2011-02-02 LAB — CBC
HCT: 34.8 — ABNORMAL LOW
Hemoglobin: 12.1
MCHC: 34.9
MCV: 90.2
Platelets: 164
RBC: 3.86 — ABNORMAL LOW
RDW: 12.4
WBC: 9.3

## 2011-02-02 LAB — HEPATIC FUNCTION PANEL
ALT: 23
AST: 19
Albumin: 3.9
Alkaline Phosphatase: 55
Bilirubin, Direct: 0.2
Indirect Bilirubin: 1.1 — ABNORMAL HIGH
Total Bilirubin: 1.3 — ABNORMAL HIGH
Total Protein: 7.4

## 2011-02-02 LAB — AMYLASE: Amylase: 52

## 2011-02-02 LAB — BASIC METABOLIC PANEL
BUN: 8
CO2: 25
Calcium: 9.6
Chloride: 105
Creatinine, Ser: 0.62
GFR calc Af Amer: >60
GFR calc non Af Amer: >60
Glucose, Bld: 100 — ABNORMAL HIGH
Potassium: 3.8
Sodium: 137

## 2011-02-02 LAB — LIPASE, BLOOD: Lipase: 17

## 2011-03-15 ENCOUNTER — Emergency Department (HOSPITAL_COMMUNITY)
Admission: EM | Admit: 2011-03-15 | Discharge: 2011-03-16 | Disposition: A | Discharge status: Home / Self Care | Payer: Self-pay | Attending: Emergency Medicine | Admitting: Emergency Medicine

## 2011-03-15 ENCOUNTER — Encounter: Payer: Self-pay | Admitting: *Deleted

## 2011-03-15 DIAGNOSIS — K5792 Diverticulitis of intestine, part unspecified, without perforation or abscess without bleeding: Secondary | ICD-10-CM

## 2011-03-15 DIAGNOSIS — R1031 Right lower quadrant pain: Secondary | ICD-10-CM | POA: Insufficient documentation

## 2011-03-15 DIAGNOSIS — I10 Essential (primary) hypertension: Secondary | ICD-10-CM | POA: Insufficient documentation

## 2011-03-15 DIAGNOSIS — K5732 Diverticulitis of large intestine without perforation or abscess without bleeding: Secondary | ICD-10-CM | POA: Insufficient documentation

## 2011-03-15 HISTORY — DX: Essential (primary) hypertension: I10

## 2011-03-15 LAB — URINE MICROSCOPIC-ADD ON

## 2011-03-15 LAB — URINALYSIS, ROUTINE W REFLEX MICROSCOPIC
Bilirubin Urine: NEGATIVE
Glucose, UA: NEGATIVE mg/dL
Ketones, ur: NEGATIVE mg/dL
Leukocytes, UA: NEGATIVE
Nitrite: NEGATIVE
Protein, ur: NEGATIVE mg/dL
Specific Gravity, Urine: 1.01 (ref 1.005–1.030)
Urobilinogen, UA: 0.2 mg/dL (ref 0.0–1.0)
pH: 6 (ref 5.0–8.0)

## 2011-03-15 LAB — PREGNANCY, URINE: Preg Test, Ur: NEGATIVE

## 2011-03-15 MED ORDER — ONDANSETRON HCL 4 MG/2ML IJ SOLN
4.0000 mg | Freq: Once | INTRAMUSCULAR | Status: AC
  Administered 2011-03-16: 4 mg via INTRAVENOUS
  Filled 2011-03-15: qty 2

## 2011-03-15 MED ORDER — HYDROMORPHONE HCL PF 1 MG/ML IJ SOLN
1.0000 mg | Freq: Once | INTRAMUSCULAR | Status: AC
  Administered 2011-03-16: 1 mg via INTRAVENOUS
  Filled 2011-03-15: qty 1

## 2011-03-15 NOTE — ED Notes (Signed)
Pt reports sudden onset of rt sided abd/flank pain starting this afternoon and worsening tonight

## 2011-03-16 ENCOUNTER — Emergency Department (HOSPITAL_COMMUNITY): Payer: Self-pay

## 2011-03-16 LAB — DIFFERENTIAL
Basophils Absolute: 0 10*3/uL (ref 0.0–0.1)
Basophils Relative: 0 % (ref 0–1)
Eosinophils Absolute: 0.1 10*3/uL (ref 0.0–0.7)
Eosinophils Relative: 1 % (ref 0–5)
Lymphocytes Relative: 33 % (ref 12–46)
Lymphs Abs: 2.5 10*3/uL (ref 0.7–4.0)
Monocytes Absolute: 0.6 10*3/uL (ref 0.1–1.0)
Monocytes Relative: 7 % (ref 3–12)
Neutro Abs: 4.4 10*3/uL (ref 1.7–7.7)
Neutrophils Relative %: 59 % (ref 43–77)

## 2011-03-16 LAB — COMPREHENSIVE METABOLIC PANEL
ALT: 17 U/L (ref 0–35)
AST: 15 U/L (ref 0–37)
Albumin: 3.9 g/dL (ref 3.5–5.2)
Alkaline Phosphatase: 65 U/L (ref 39–117)
BUN: 13 mg/dL (ref 6–23)
CO2: 29 mEq/L (ref 19–32)
Calcium: 9.9 mg/dL (ref 8.4–10.5)
Chloride: 101 mEq/L (ref 96–112)
Creatinine, Ser: 0.63 mg/dL (ref 0.50–1.10)
GFR calc Af Amer: >90 mL/min (ref >90)
GFR calc non Af Amer: >90 mL/min (ref >90)
Glucose, Bld: 99 mg/dL (ref 70–99)
Potassium: 4 mEq/L (ref 3.5–5.1)
Sodium: 137 mEq/L (ref 135–145)
Total Bilirubin: 0.5 mg/dL (ref 0.3–1.2)
Total Protein: 7.7 g/dL (ref 6.0–8.3)

## 2011-03-16 LAB — CBC
HCT: 34.9 % — ABNORMAL LOW (ref 36.0–46.0)
Hemoglobin: 12 g/dL (ref 12.0–15.0)
MCH: 31.3 pg (ref 26.0–34.0)
MCHC: 34.4 g/dL (ref 30.0–36.0)
MCV: 90.9 fL (ref 78.0–100.0)
Platelets: 207 10*3/uL (ref 150–400)
RBC: 3.84 MIL/uL — ABNORMAL LOW (ref 3.87–5.11)
RDW: 12.3 % (ref 11.5–15.5)
WBC: 7.5 10*3/uL (ref 4.0–10.5)

## 2011-03-16 MED ORDER — METRONIDAZOLE 500 MG PO TABS: 500.0000 mg | ORAL_TABLET | Freq: Two times a day (BID) | ORAL | Status: AC

## 2011-03-16 MED ORDER — IOHEXOL 300 MG/ML  SOLN
100.0000 mL | Freq: Once | INTRAMUSCULAR | Status: AC | PRN
  Administered 2011-03-16: 100 mL via INTRAVENOUS

## 2011-03-16 MED ORDER — ONDANSETRON HCL 4 MG/2ML IJ SOLN
INTRAMUSCULAR | Status: AC
  Filled 2011-03-16: qty 2

## 2011-03-16 MED ORDER — OXYCODONE-ACETAMINOPHEN 5-325 MG PO TABS: 2.0000 | ORAL_TABLET | ORAL | Status: AC | PRN

## 2011-03-16 MED ORDER — CIPROFLOXACIN HCL 500 MG PO TABS: 500.0000 mg | ORAL_TABLET | Freq: Two times a day (BID) | ORAL | Status: AC

## 2011-03-16 MED ORDER — ONDANSETRON HCL 4 MG/2ML IJ SOLN
4.0000 mg | Freq: Once | INTRAMUSCULAR | Status: AC
  Administered 2011-03-16: 4 mg via INTRAVENOUS

## 2011-03-16 MED ORDER — METRONIDAZOLE 500 MG PO TABS
500.0000 mg | ORAL_TABLET | Freq: Once | ORAL | Status: AC
  Administered 2011-03-16: 500 mg via ORAL
  Filled 2011-03-16: qty 1

## 2011-03-16 MED ORDER — PROMETHAZINE HCL 25 MG PO TABS: 25.0000 mg | ORAL_TABLET | Freq: Four times a day (QID) | ORAL | Status: DC | PRN

## 2011-03-16 MED ORDER — CIPROFLOXACIN HCL 250 MG PO TABS
500.0000 mg | ORAL_TABLET | Freq: Once | ORAL | Status: AC
  Administered 2011-03-16: 500 mg via ORAL
  Filled 2011-03-16: qty 2

## 2011-03-16 NOTE — ED Provider Notes (Signed)
History     CSN: [id number] Arrival date & time: 03/15/2011  9:59 PM   First MD Initiated Contact with Patient 03/15/11 2305      Chief Complaint  Patient presents with  . Abdominal Pain    (Consider location/radiation/quality/duration/timing/severity/associated sxs/prior treatment) Patient is a 40 y.o. female presenting with abdominal pain. The history is provided by the patient.  Abdominal Pain The primary symptoms of the illness include abdominal pain. The primary symptoms of the illness do not include fever, shortness of breath, diarrhea or dysuria. The current episode started yesterday. The onset of the illness was gradual. The problem has been gradually worsening.  Associated with: Some nausea and vomiting but no known inciting factors. The patient states that she believes she is currently not pregnant. The patient has not had a change in bowel habit. Additional symptoms associated with the illness include chills. Symptoms associated with the illness do not include anorexia, diaphoresis, heartburn, constipation, urgency, hematuria, frequency or back pain. Significant associated medical issues do not include diabetes.   pain is located in right lower quadrant of the abdomen, moderate to severe in severity, sharp in nature. Pain is not radiating. Worse with movement and palpation. There is no position or factors that make it better. No trauma no rash. No sick contacts. No blood in stools. No bilious or bloody vomiting. No history of same.  Past Medical History  Diagnosis Date  . Hypertension     Past Surgical History  Procedure Date  . Tubal ligation     No family history on file.  History  Substance Use Topics  . Smoking status: Never Smoker   . Smokeless tobacco: Not on file  . Alcohol Use: No    OB History    Grav Para Term Preterm Abortions TAB SAB Ect Mult Living                  Review of Systems  Constitutional: Positive for chills. Negative for fever and  diaphoresis.  HENT: Negative for neck pain and neck stiffness.   Eyes: Negative for pain.  Respiratory: Negative for shortness of breath.   Cardiovascular: Negative for chest pain, palpitations and leg swelling.  Gastrointestinal: Positive for abdominal pain. Negative for heartburn, diarrhea, constipation, abdominal distention, rectal pain and anorexia.  Genitourinary: Negative for dysuria, urgency, frequency, hematuria and flank pain.  Musculoskeletal: Negative for back pain.  Skin: Negative for rash.  Neurological: Negative for headaches.  All other systems reviewed and are negative.    Allergies  Review of patient's allergies indicates no known allergies.  Home Medications   Current Outpatient Rx  Name Route Sig Dispense Refill  . HYDROCODONE-ACETAMINOPHEN 10-325 MG PO TABS Oral Take 0.5-1 tablets by mouth every 4 (four) hours as needed. Every 4 to 6 hours as needed for pain     . IBUPROFEN 200 MG PO CAPS Oral Take 1 capsule by mouth as needed. For migraine     . METOPROLOL SUCCINATE 50 MG PO TB24 Oral Take 50 mg by mouth daily.        BP 157/87  Pulse 102  Temp(Src) 98 F (36.7 C) (Oral)  Resp 15  Ht 5\' 7"  (1.702 m)  Wt 165 lb (74.844 kg)  BMI 25.84 kg/m2  SpO2 98%  LMP 03/10/2011  Physical Exam  Constitutional: She is oriented to person, place, and time. She appears well-developed and well-nourished.  HENT:  Head: Normocephalic and atraumatic.  Eyes: Conjunctivae and EOM are normal. Pupils are equal,  round, and reactive to light.  Neck: Trachea normal. Neck supple. No thyromegaly present.  Cardiovascular: Normal rate, regular rhythm, S1 normal, S2 normal and normal pulses.     No systolic murmur is present   No diastolic murmur is present  Pulses:      Radial pulses are 2+ on the right side, and 2+ on the left side.  Pulmonary/Chest: Effort normal and breath sounds normal. She has no wheezes. She has no rhonchi. She has no rales. She exhibits no tenderness.    Abdominal: Soft. Normal appearance and bowel sounds are normal. There is no CVA tenderness and negative Murphy's sign.       Tender palpation over right lower quadrant of abdomen. Voluntary guarding, no rebound. No right upper quadrant tenderness and negative Murphy's sign.  Musculoskeletal:       BLE:s Calves nontender, no cords or erythema, negative Homans sign  Neurological: She is alert and oriented to person, place, and time. She has normal strength. No cranial nerve deficit or sensory deficit. GCS eye subscore is 4. GCS verbal subscore is 5. GCS motor subscore is 6.  Skin: Skin is warm and dry. No rash noted. She is not diaphoretic.  Psychiatric: Her speech is normal.       Cooperative and appropriate    ED Course  Procedures (including critical care time)  Results for orders placed during the hospital encounter of 03/15/11  URINALYSIS, ROUTINE W REFLEX MICROSCOPIC      Component Value Range   Color, Urine YELLOW  YELLOW    Appearance CLEAR  CLEAR    Specific Gravity, Urine 1.010  1.005 - 1.030    pH 6.0  5.0 - 8.0    Glucose, UA NEGATIVE  NEGATIVE (mg/dL)   Hgb urine dipstick SMALL (*) NEGATIVE    Bilirubin Urine NEGATIVE  NEGATIVE    Ketones, ur NEGATIVE  NEGATIVE (mg/dL)   Protein, ur NEGATIVE  NEGATIVE (mg/dL)   Urobilinogen, UA 0.2  0.0 - 1.0 (mg/dL)   Nitrite NEGATIVE  NEGATIVE    Leukocytes, UA NEGATIVE  NEGATIVE   PREGNANCY, URINE      Component Value Range   Preg Test, Ur NEGATIVE    URINE MICROSCOPIC-ADD ON      Component Value Range   Squamous Epithelial / LPF FEW (*) RARE    WBC, UA 0-2  <3 (WBC/hpf)   RBC / HPF 0-2  <3 (RBC/hpf)   Bacteria, UA RARE  RARE   CBC      Component Value Range   WBC 7.5  4.0 - 10.5 (K/uL)   RBC 3.84 (*) 3.87 - 5.11 (MIL/uL)   Hemoglobin 12.0  12.0 - 15.0 (g/dL)   HCT 04.5 (*) 40.9 - 46.0 (%)   MCV 90.9  78.0 - 100.0 (fL)   MCH 31.3  26.0 - 34.0 (pg)   MCHC 34.4  30.0 - 36.0 (g/dL)   RDW 81.1  91.4 - 78.2 (%)   Platelets  207  150 - 400 (K/uL)  DIFFERENTIAL      Component Value Range   Neutrophils Relative 59  43 - 77 (%)   Neutro Abs 4.4  1.7 - 7.7 (K/uL)   Lymphocytes Relative 33  12 - 46 (%)   Lymphs Abs 2.5  0.7 - 4.0 (K/uL)   Monocytes Relative 7  3 - 12 (%)   Monocytes Absolute 0.6  0.1 - 1.0 (K/uL)   Eosinophils Relative 1  0 - 5 (%)   Eosinophils Absolute  0.1  0.0 - 0.7 (K/uL)   Basophils Relative 0  0 - 1 (%)   Basophils Absolute 0.0  0.0 - 0.1 (K/uL)  COMPREHENSIVE METABOLIC PANEL      Component Value Range   Sodium 137  135 - 145 (mEq/L)   Potassium 4.0  3.5 - 5.1 (mEq/L)   Chloride 101  96 - 112 (mEq/L)   CO2 29  19 - 32 (mEq/L)   Glucose, Bld 99  70 - 99 (mg/dL)   BUN 13  6 - 23 (mg/dL)   Creatinine, Ser 1.61  0.50 - 1.10 (mg/dL)   Calcium 9.9  8.4 - 09.6 (mg/dL)   Total Protein 7.7  6.0 - 8.3 (g/dL)   Albumin 3.9  3.5 - 5.2 (g/dL)   AST 15  0 - 37 (U/L)   ALT 17  0 - 35 (U/L)   Alkaline Phosphatase 65  39 - 117 (U/L)   Total Bilirubin 0.5  0.3 - 1.2 (mg/dL)   GFR calc non Af Amer >90  >90 (mL/min)   GFR calc Af Amer >90  >90 (mL/min)   Ct Abdomen Pelvis W Contrast  03/16/2011  *RADIOLOGY REPORT*  Clinical Data: Sudden onset right lower quadrant pain  CT ABDOMEN AND PELVIS WITH CONTRAST  Technique:  Multidetector CT imaging of the abdomen and pelvis was performed following the standard protocol during bolus administration of intravenous contrast.  Contrast: OMNIPAQUE IOHEXOL 300 MG/ML IV SOLN  Comparison: 03/08/2010  Findings: Minimal focal infiltration or atelectasis in the right lung base.  The liver, spleen, gallbladder, pancreas, adrenal glands, kidneys, stomach, abdominal aorta, and retroperitoneal lymph nodes are unremarkable.  The small bowel are decompressed. No free air or free fluid in the abdomen.  Stool filled colon without distension.  Pelvis:  In the right lower quadrant, involving the lateral peri cecal region, there is soft tissue edema and infiltration into the  pericolonic fat.  The appendix is noted medially and is normal. This suggests a colonic inflammatory process such as right colonic diverticulitis.  No focal abscess.  No free or loculated pelvic fluid collections.  The uterus is enlarged and nodular consistent with leiomyomatous change.  The bladder wall is not thickened. Calcified phleboliths in the pelvis.  Normal alignment of the lumbar vertebra.  IMPRESSION: Pericecal inflammatory infiltration laterally suggesting a right colonic diverticulitis.  The appendix is not medial and is normal. Multiple uterine fibroids.  Original Report Authenticated By: Marlon Pel, M.D.      Diagnosis: Diverticulitis   MDM   Right lower quadrant abdominal pain and concerning exams a CT scan was ordered as above. IV dilaudid and fluids provided. IV Zofran for nausea. CT scan results shared with patient and she was given by mouth Cipro and Flagyl. Patient is tolerating by mouth fluids well, is afebrile and has good pain control in the emergency department. She has close followup with Dr. Lysbeth Galas in a stable for discharge home at this time. Reliable historian states understanding strict return precautions        Sunnie Nielsen, MD 03/16/11 [phone]

## 2011-07-26 ENCOUNTER — Encounter (HOSPITAL_COMMUNITY): Payer: Self-pay | Admitting: *Deleted

## 2011-07-26 ENCOUNTER — Emergency Department (HOSPITAL_COMMUNITY)
Admission: EM | Admit: 2011-07-26 | Discharge: 2011-07-27 | Disposition: A | Discharge status: Home / Self Care | Payer: Self-pay | Attending: Emergency Medicine | Admitting: Emergency Medicine

## 2011-07-26 DIAGNOSIS — R51 Headache: Secondary | ICD-10-CM | POA: Insufficient documentation

## 2011-07-26 DIAGNOSIS — R197 Diarrhea, unspecified: Secondary | ICD-10-CM | POA: Insufficient documentation

## 2011-07-26 DIAGNOSIS — IMO0001 Reserved for inherently not codable concepts without codable children: Secondary | ICD-10-CM | POA: Insufficient documentation

## 2011-07-26 DIAGNOSIS — Z79899 Other long term (current) drug therapy: Secondary | ICD-10-CM | POA: Insufficient documentation

## 2011-07-26 DIAGNOSIS — I10 Essential (primary) hypertension: Secondary | ICD-10-CM | POA: Insufficient documentation

## 2011-07-26 DIAGNOSIS — B9789 Other viral agents as the cause of diseases classified elsewhere: Secondary | ICD-10-CM | POA: Insufficient documentation

## 2011-07-26 DIAGNOSIS — R070 Pain in throat: Secondary | ICD-10-CM | POA: Insufficient documentation

## 2011-07-26 DIAGNOSIS — R112 Nausea with vomiting, unspecified: Secondary | ICD-10-CM | POA: Insufficient documentation

## 2011-07-26 DIAGNOSIS — B349 Viral infection, unspecified: Secondary | ICD-10-CM

## 2011-07-26 LAB — DIFFERENTIAL
Basophils Absolute: 0 10*3/uL (ref 0.0–0.1)
Basophils Relative: 0 % (ref 0–1)
Eosinophils Absolute: 0 10*3/uL (ref 0.0–0.7)
Eosinophils Relative: 1 % (ref 0–5)
Lymphocytes Relative: 19 % (ref 12–46)
Lymphs Abs: 1.6 10*3/uL (ref 0.7–4.0)
Monocytes Absolute: 0.6 10*3/uL (ref 0.1–1.0)
Monocytes Relative: 7 % (ref 3–12)
Neutro Abs: 6 10*3/uL (ref 1.7–7.7)
Neutrophils Relative %: 73 % (ref 43–77)

## 2011-07-26 LAB — BASIC METABOLIC PANEL
BUN: 16 mg/dL (ref 6–23)
CO2: 26 mEq/L (ref 19–32)
Calcium: 9.9 mg/dL (ref 8.4–10.5)
Chloride: 100 mEq/L (ref 96–112)
Creatinine, Ser: 0.64 mg/dL (ref 0.50–1.10)
GFR calc Af Amer: >90 mL/min (ref >90)
GFR calc non Af Amer: >90 mL/min (ref >90)
Glucose, Bld: 95 mg/dL (ref 70–99)
Potassium: 3.5 mEq/L (ref 3.5–5.1)
Sodium: 135 mEq/L (ref 135–145)

## 2011-07-26 LAB — CBC
HCT: 35.2 % — ABNORMAL LOW (ref 36.0–46.0)
Hemoglobin: 12.4 g/dL (ref 12.0–15.0)
MCH: 31.3 pg (ref 26.0–34.0)
MCHC: 35.2 g/dL (ref 30.0–36.0)
MCV: 88.9 fL (ref 78.0–100.0)
Platelets: 179 10*3/uL (ref 150–400)
RBC: 3.96 MIL/uL (ref 3.87–5.11)
RDW: 12.3 % (ref 11.5–15.5)
WBC: 8.2 10*3/uL (ref 4.0–10.5)

## 2011-07-26 MED ORDER — MORPHINE SULFATE 2 MG/ML IJ SOLN
2.0000 mg | Freq: Once | INTRAMUSCULAR | Status: AC
  Administered 2011-07-27: 2 mg via INTRAVENOUS
  Filled 2011-07-26: qty 1

## 2011-07-26 MED ORDER — PROMETHAZINE HCL 25 MG PO TABS: 12.5000 mg | ORAL_TABLET | Freq: Four times a day (QID) | ORAL | Status: DC | PRN

## 2011-07-26 MED ORDER — KETOROLAC TROMETHAMINE 30 MG/ML IJ SOLN
30.0000 mg | Freq: Once | INTRAMUSCULAR | Status: AC
  Administered 2011-07-27: 30 mg via INTRAVENOUS
  Filled 2011-07-26: qty 1

## 2011-07-26 MED ORDER — SODIUM CHLORIDE 0.9 % IV BOLUS (SEPSIS)
1000.0000 mL | Freq: Once | INTRAVENOUS | Status: AC
  Administered 2011-07-27: 1000 mL via INTRAVENOUS

## 2011-07-26 MED ORDER — ONDANSETRON HCL 4 MG/2ML IJ SOLN
INTRAMUSCULAR | Status: AC
  Administered 2011-07-26: 4 mg
  Filled 2011-07-26: qty 2

## 2011-07-26 MED ORDER — ONDANSETRON HCL 4 MG/2ML IJ SOLN: 4.0000 mg | Freq: Once | INTRAMUSCULAR | Status: DC

## 2011-07-26 NOTE — ED Notes (Signed)
Vomiting, no diarrhea, sore throat, body aches.

## 2011-07-26 NOTE — ED Provider Notes (Signed)
History     CSN: [id number]  Arrival date & time 07/26/11  2224   First MD Initiated Contact with Patient 07/26/11 2303      Chief Complaint  Patient presents with  . Emesis    (Consider location/radiation/quality/duration/timing/severity/associated sxs/prior treatment) HPI Brittany Durham is a 41 y.o. female who presents to the Emergency Department complaining of headache, sore throat, body aches, nausea, vomiting, and diarrhea that began this morning. She has taken Pepcid AC with no relief. She denies fever, chills, shortness of breath, chest pain.  Past Medical History  Diagnosis Date  . Hypertension     Past Surgical History  Procedure Date  . Tubal ligation     Family History  Problem Relation Age of Onset  . Hypertension Mother     History  Substance Use Topics  . Smoking status: Never Smoker   . Smokeless tobacco: Not on file  . Alcohol Use: No    OB History    Grav Para Term Preterm Abortions TAB SAB Ect Mult Living                  Review of Systems 10 Systems reviewed and are negative for acute change except as noted in the HPI.  Allergies  Review of patient's allergies indicates no known allergies.  Home Medications   Current Outpatient Rx  Name Route Sig Dispense Refill  . METOPROLOL SUCCINATE ER 50 MG PO TB24 Oral Take 50 mg by mouth daily.      Marland Kitchen HYDROCODONE-ACETAMINOPHEN 10-325 MG PO TABS Oral Take 0.5-1 tablets by mouth every 4 (four) hours as needed. Every 4 to 6 hours as needed for pain     . IBUPROFEN 200 MG PO CAPS Oral Take 1 capsule by mouth as needed. For migraine       BP 157/98  Pulse 100  Temp(Src) 98.3 F (36.8 C) (Oral)  Resp 20  Ht 5\' 7"  (1.702 m)  Wt 170 lb (77.111 kg)  BMI 26.63 kg/m2  SpO2 100%  LMP 07/06/2011  Physical Exam Physical examination:  Nursing notes reviewed; Vital signs and O2 SAT reviewed;  Constitutional: Well developed, Well nourished, Well hydrated, In no acute distress; Head:  Normocephalic,  atraumatic; Eyes: EOMI, PERRL, No scleral icterus; ENMT: Mouth and pharynx normal, Mucous membranes moist; Neck: Supple, Full range of motion, No lymphadenopathy; Cardiovascular: Regular rate and rhythm, No murmur, rub, or gallop; Respiratory: Breath sounds clear & equal bilaterally, No rales, rhonchi, wheezes, or rub, Normal respiratory effort/excursion; Chest: Nontender, Movement normal; Abdomen: Soft, Nontender, Nondistended, Normal bowel sounds; Genitourinary: No CVA tenderness; Extremities: Pulses normal, No tenderness, No edema, No calf edema or asymmetry.; Neuro: AA&Ox3, Major CN grossly intact.  No gross focal motor or sensory deficits in extremities.; Skin: Color normal, Warm, Dry  ED Course  Procedures (including critical care time) DIAGNOSTIC STUDIES: Oxygen Saturation is 100% on room air, normal by my interpretation.    COORDINATION OF CARE:     Labs Reviewed  CBC  DIFFERENTIAL  BASIC METABOLIC PANEL    No diagnosis found.    MDM  Patient with history of viral illness that began this morning. Given IV fluids, antiemetic, analgesics, anti-inflammatory, with relief. Pt feels improved after observation and/or treatment in ED.Pt stable in ED with no significant deterioration in condition.The patient appears reasonably screened and/or stabilized for discharge and I doubt any other medical condition or other [facility] requiring further screening, evaluation, or treatment in the ED at this time prior to discharge.  MDM Reviewed: nursing note Interpretation: labs      Nicoletta Dress. Colon Branch, MD 07/27/11 [phone]

## 2011-07-27 MED ORDER — PROMETHAZINE HCL 25 MG PO TABS: 12.5000 mg | ORAL_TABLET | Freq: Four times a day (QID) | ORAL | Status: DC | PRN

## 2011-07-27 NOTE — Discharge Instructions (Signed)
Drink lots of fluids. Use tylenol or ibuprofen for the aches, pains, and any fevers. Use the nausea medicine as needed.

## 2011-11-01 ENCOUNTER — Emergency Department (HOSPITAL_COMMUNITY)
Admission: EM | Admit: 2011-11-01 | Discharge: 2011-11-01 | Disposition: A | Discharge status: Home / Self Care | Payer: Self-pay | Attending: Emergency Medicine | Admitting: Emergency Medicine

## 2011-11-01 ENCOUNTER — Encounter (HOSPITAL_COMMUNITY): Payer: Self-pay | Admitting: Emergency Medicine

## 2011-11-01 ENCOUNTER — Emergency Department (HOSPITAL_COMMUNITY): Payer: Self-pay

## 2011-11-01 DIAGNOSIS — Y9229 Other specified public building as the place of occurrence of the external cause: Secondary | ICD-10-CM | POA: Insufficient documentation

## 2011-11-01 DIAGNOSIS — S53401A Unspecified sprain of right elbow, initial encounter: Secondary | ICD-10-CM

## 2011-11-01 DIAGNOSIS — W19XXXA Unspecified fall, initial encounter: Secondary | ICD-10-CM | POA: Insufficient documentation

## 2011-11-01 DIAGNOSIS — R55 Syncope and collapse: Secondary | ICD-10-CM | POA: Insufficient documentation

## 2011-11-01 DIAGNOSIS — S060X9A Concussion with loss of consciousness of unspecified duration, initial encounter: Secondary | ICD-10-CM | POA: Insufficient documentation

## 2011-11-01 DIAGNOSIS — I10 Essential (primary) hypertension: Secondary | ICD-10-CM | POA: Insufficient documentation

## 2011-11-01 DIAGNOSIS — IMO0002 Reserved for concepts with insufficient information to code with codable children: Secondary | ICD-10-CM | POA: Insufficient documentation

## 2011-11-01 DIAGNOSIS — M25529 Pain in unspecified elbow: Secondary | ICD-10-CM | POA: Insufficient documentation

## 2011-11-01 MED ORDER — OXYCODONE-ACETAMINOPHEN 5-325 MG PO TABS
1.0000 | ORAL_TABLET | Freq: Once | ORAL | Status: AC
  Administered 2011-11-01: 1 via ORAL
  Filled 2011-11-01: qty 1

## 2011-11-01 MED ORDER — LORAZEPAM 1 MG PO TABS
1.0000 mg | ORAL_TABLET | Freq: Once | ORAL | Status: AC
  Administered 2011-11-01: 1 mg via ORAL
  Filled 2011-11-01: qty 1

## 2011-11-01 NOTE — Discharge Instructions (Signed)

## 2011-11-01 NOTE — ED Notes (Signed)
Pt given pain medication as ordered. While in room RN overheard the pt telling 2 family members that she " fell out and had a seizure". Pt has not mentioned this to ED staff. Pt is however c/o rt elbow pain and a headache "after hitting them when she fell out". Pt has full range of motion in rt arm, to include the elbow. Pt has been seen holding a cell phone to her ear, drinking from a cup without a straw and assisted herself off the bed, all with her Rt arm with no prior c/o pain. EDP notified of new c/o.

## 2011-11-01 NOTE — ED Provider Notes (Signed)
History   This chart was scribed for Joya Gaskins, MD by Clarita Crane. The patient was seen in room APA06/APA06. Patient's care was started at 1027.    CSN: [id number]  Arrival date & time 11/01/11  1027   First MD Initiated Contact with Patient 11/01/11 1039      Chief Complaint  Patient presents with  . Loss of Consciousness     HPI Brittany Durham is a 41 y.o. female who presents to the Emergency Department to be evaluated following moderate syncopal episode with questionable seizure like activity and associated head injury which occurred 1 hour ago while she was accompanying her mother to a visit with [facility] cardiology. Patient's syncopal episode occurred immediately following her mother lost consciousness during evaluation at [facility]. Denies tongue biting, incontinence, nausea, vomiting, diarrhea, chest pain, abdominal pain, hematochezia, rectal bleeding. Denies h/o seizures, stroke, MI and recent changes in medications. Patient with h/o HTN, tubal ligation. LMP- currently menstruating.   Past Medical History  Diagnosis Date  . Hypertension     Past Surgical History  Procedure Date  . Tubal ligation     Family History  Problem Relation Age of Onset  . Hypertension Mother     History  Substance Use Topics  . Smoking status: Never Smoker   . Smokeless tobacco: Not on file  . Alcohol Use: No    OB History    Grav Para Term Preterm Abortions TAB SAB Ect Mult Living                  Review of Systems A complete 10 system review of systems was obtained and all systems are negative except as noted in the HPI and PMH.   Allergies  Review of patient's allergies indicates no known allergies.  Home Medications   Current Outpatient Rx  Name Route Sig Dispense Refill  . METOPROLOL SUCCINATE ER 50 MG PO TB24 Oral Take 50 mg by mouth daily.        BP 172/112  Pulse 113  Temp 98.5 F (36.9 C) (Oral)  Ht 5\' 7"  (1.702 m)  Wt 156 lb (70.761 kg)  BMI 24.43 kg/m2   SpO2 99%  LMP 10/31/2011  Physical Exam CONSTITUTIONAL: Well developed/well nourished HEAD AND FACE: Normocephalic/atraumatic, tenderness to poster aspect of head EYES: EOMI/PERRL ENMT: Mucous membranes moist, no trismus, No evidence of facial/nasal trauma NECK: supple no meningeal signs SPINE:entire spine nontender CV: S1/S2 noted, no murmurs/rubs/gallops noted LUNGS: Lungs are clear to auscultation bilaterally, no apparent distress ABDOMEN: soft, nontender, no rebound or guarding GU:no cva tenderness NEURO: Pt is awake/alert, moves all extremitiesx4, Awake/alert, facies symmetric, no arm or leg drift is noted,  EXTREMITIES: pulses normal, full ROM SKIN: warm, color normal PSYCH: no abnormalities of mood noted  ED Course  Procedures   DIAGNOSTIC STUDIES: Oxygen Saturation is 99% on room air, normal by my interpretation.    COORDINATION OF CARE: 10:58AM- Patient informed of current plan for treatment and evaluation and agrees with plan at this time.  Suspect this occurred due to witnessing mother have LOC.  Suspect vasovagal event. 12:25PM- Patient informed of CT-Head results and current clinical impression.  Pt now with right elbow pain with ROM No other extremity injury Will get xray of elbow Reported sz after hitting head after fall, though suspect pt has concussion after fall and unlikely has new onset seizure Pt ambulatory Stable for d/c with negative ct head and elbow film  Ct Head Wo Contrast  11/01/2011  *  RADIOLOGY REPORT*  Clinical Data: Fall, right-sided pain  CT HEAD WITHOUT CONTRAST  Technique:  Contiguous axial images were obtained from the base of the skull through the vertex without contrast.  Comparison: June 05, 2006  Findings: The ventricles are normal in size, shape, and position. There is no mass effect or midline shift.  No acute hemorrhage or abnormal extra-axial fluid collections are identified.  The gray/white differentiation is normal.  The orbits,  calvarium, visualized paranasal sinuses have a normal appearance.  IMPRESSION: Normal CT scan of the head with no evidence of acute intracranial abnormality.  Original Report Authenticated By: Brandon Melnick, M.D.      MDM  Nursing notes including past medical history and social history reviewed and considered in documentation Previous records reviewed and considered - last echo showed normal EF xrays reviewed and considered,    Date: 11/01/2011  Rate: 107  Rhythm: sinus tachycardia  QRS Axis: normal  Intervals: normal  ST/T Wave abnormalities: lvh noted  Conduction Disutrbances:none  Narrative Interpretation:   Old EKG Reviewed: unchanged     I personally performed the services described in this documentation, which was scribed in my presence. The recorded information has been reviewed and considered.      Joya Gaskins, MD 11/01/11 1339

## 2011-11-01 NOTE — ED Notes (Addendum)
Pt assisted to restroom. Steady gait, however ambulates slow. Pt states has a headache. EDP notified. Pt assisted to room 18 as pt's mother is pt in said room.

## 2011-11-01 NOTE — ED Notes (Signed)
Pt states she was with her mother at [facility] and states she passed out. Pt states her mother passed out and then the same happened to here. Pt does not remember.

## 2011-11-01 NOTE — ED Notes (Signed)
Pt states she does not remember exactly what happened. She states she called the nurse into the room to see her mother and then went to get her father in the waiting room and that is all she remembers. Pt is very upset and is worried about her mother at this time.

## 2011-11-01 NOTE — ED Notes (Signed)
Pt resting quietly at this time. No needs expressed at this time.

## 2012-11-29 ENCOUNTER — Emergency Department (HOSPITAL_COMMUNITY)
Admission: EM | Admit: 2012-11-29 | Discharge: 2012-11-29 | Disposition: A | Discharge status: Home / Self Care | Payer: Self-pay | Attending: Emergency Medicine | Admitting: Emergency Medicine

## 2012-11-29 ENCOUNTER — Encounter (HOSPITAL_COMMUNITY): Payer: Self-pay | Admitting: Emergency Medicine

## 2012-11-29 DIAGNOSIS — I10 Essential (primary) hypertension: Secondary | ICD-10-CM | POA: Insufficient documentation

## 2012-11-29 DIAGNOSIS — L299 Pruritus, unspecified: Secondary | ICD-10-CM | POA: Insufficient documentation

## 2012-11-29 DIAGNOSIS — L259 Unspecified contact dermatitis, unspecified cause: Secondary | ICD-10-CM

## 2012-11-29 DIAGNOSIS — L240 Irritant contact dermatitis due to detergents: Secondary | ICD-10-CM | POA: Insufficient documentation

## 2012-11-29 DIAGNOSIS — R000 Tachycardia, unspecified: Secondary | ICD-10-CM | POA: Insufficient documentation

## 2012-11-29 MED ORDER — FAMOTIDINE 20 MG PO TABS
20.0000 mg | ORAL_TABLET | Freq: Once | ORAL | Status: AC
  Administered 2012-11-29: 20 mg via ORAL
  Filled 2012-11-29: qty 1

## 2012-11-29 MED ORDER — FAMOTIDINE 20 MG PO TABS: 20.0000 mg | ORAL_TABLET | Freq: Two times a day (BID) | ORAL | Status: DC

## 2012-11-29 MED ORDER — PREDNISONE 20 MG PO TABS
40.0000 mg | ORAL_TABLET | Freq: Once | ORAL | Status: AC
  Administered 2012-11-29: 40 mg via ORAL
  Filled 2012-11-29: qty 2

## 2012-11-29 MED ORDER — CETIRIZINE HCL 10 MG PO TABS: 10.0000 mg | ORAL_TABLET | Freq: Every day | ORAL | Status: DC

## 2012-11-29 MED ORDER — PREDNISONE 10 MG PO TABS: 20.0000 mg | ORAL_TABLET | Freq: Two times a day (BID) | ORAL | Status: DC

## 2012-11-29 NOTE — ED Notes (Signed)
Pt was visiting a family member. Clothes washed in a different detergent. Pt states he and her daughter both broke out with a rash on Wed. Generalized rash. Pt reports intense itching.

## 2012-11-29 NOTE — ED Provider Notes (Signed)
History    CSN: [id number] Arrival date & time 11/29/12  2104  First MD Initiated Contact with Patient 11/29/12 2118     Chief Complaint  Patient presents with  . Rash   (Consider location/radiation/quality/duration/timing/severity/associated sxs/prior Treatment) Patient is a 42 y.o. female presenting with rash. The history is provided by the patient.  Rash Associated symptoms: no chills, no fever, no nausea, no shortness of breath and no vomiting   Brittany Durham is a 42 y.o. female who presents to the ED with a rash. She has been visiting family in [place] and her clothes were washed in a different detergent than usual. The rash started 3 days ago. It is on the upper and lower extremities. She complains of itching. She denies nausea, vomiting, fever or chills.  Past Medical History  Diagnosis Date  . Hypertension    Past Surgical History  Procedure Laterality Date  . Tubal ligation     Family History  Problem Relation Age of Onset  . Hypertension Mother   . Hypertension Brother    History  Substance Use Topics  . Smoking status: Never Smoker   . Smokeless tobacco: Not on file  . Alcohol Use: No   OB History   Grav Para Term Preterm Abortions TAB SAB Ect Mult Living   1 1 1             Review of Systems  Constitutional: Negative for fever and chills.  HENT: Negative for neck pain.   Respiratory: Negative for shortness of breath.   Gastrointestinal: Negative for nausea and vomiting.  Genitourinary: Negative for urgency.  Skin: Positive for rash.  Psychiatric/Behavioral: The patient is not nervous/anxious.     Allergies  Review of patient's allergies indicates no known allergies.  Home Medications  No current outpatient prescriptions on file. BP 185/103  Pulse 103  Temp(Src) 97.6 F (36.4 C) (Oral)  Resp 16  Ht 5\' 7"  (1.702 m)  Wt 163 lb (73.936 kg)  BMI 25.52 kg/m2  SpO2 100%  LMP 11/18/2012 Physical Exam  Nursing note and vitals reviewed. Constitutional:  She is oriented to person, place, and time. She appears well-developed and well-nourished. No distress.  Elevated BP   HENT:  Head: Normocephalic.  Mouth/Throat: Uvula is midline, oropharynx is clear and moist and mucous membranes are normal.  Eyes: EOM are normal.  Neck: Normal range of motion. Neck supple.  Cardiovascular: Tachycardia present.   Pulmonary/Chest: Effort normal and breath sounds normal.  Musculoskeletal: Normal range of motion.  See skin exam  Neurological: She is alert and oriented to person, place, and time. No cranial nerve deficit.  Skin: Rash noted.  Raised red rash noted upper and lower extremities.   Psychiatric: She has a normal mood and affect. Her behavior is normal.    ED Course: Discussed case with Dr. Juleen China  Procedures   MDM  42 y.o. female with contact dermitis. Will treat symptoms and give dermatology referral.  Discussed with the patient clinical findings and plan of care and all questioned fully answered. She will return if any problems arise.  Patient also with elevated BP. Discussed with patient and she states has not had her BP medication today will go home and take now.   Medication List    TAKE these medications       cetirizine 10 MG tablet  Commonly known as:  ZYRTEC  Take 1 tablet (10 mg total) by mouth daily.     famotidine 20 MG tablet  Commonly  known as:  PEPCID  Take 1 tablet (20 mg total) by mouth 2 (two) times daily.     predniSONE 10 MG tablet  Commonly known as:  DELTASONE  Take 2 tablets (20 mg total) by mouth 2 (two) times daily.      ASK your doctor about these medications       DIOVAN PO  Take 1 tablet by mouth daily.         [address] [place], [place] 11/30/12 [phone]

## 2012-11-29 NOTE — Discharge Instructions (Signed)

## 2012-12-04 NOTE — ED Provider Notes (Signed)
Medical screening examination/treatment/procedure(s) were performed by non-physician practitioner and as supervising physician I was immediately available for consultation/collaboration.  Sherrice Creekmore, MD 12/04/12 2235 

## 2014-02-22 ENCOUNTER — Encounter (HOSPITAL_COMMUNITY): Payer: Self-pay | Admitting: Emergency Medicine

## 2014-02-22 ENCOUNTER — Emergency Department (HOSPITAL_COMMUNITY)
Admission: EM | Admit: 2014-02-22 | Discharge: 2014-02-22 | Disposition: A | Discharge status: Home / Self Care | Payer: Self-pay | Attending: Emergency Medicine | Admitting: Emergency Medicine

## 2014-02-22 ENCOUNTER — Emergency Department (HOSPITAL_COMMUNITY): Payer: Self-pay

## 2014-02-22 DIAGNOSIS — I10 Essential (primary) hypertension: Secondary | ICD-10-CM | POA: Insufficient documentation

## 2014-02-22 DIAGNOSIS — Z79899 Other long term (current) drug therapy: Secondary | ICD-10-CM | POA: Insufficient documentation

## 2014-02-22 DIAGNOSIS — G8929 Other chronic pain: Secondary | ICD-10-CM

## 2014-02-22 DIAGNOSIS — M25562 Pain in left knee: Secondary | ICD-10-CM | POA: Insufficient documentation

## 2014-02-22 DIAGNOSIS — Z7952 Long term (current) use of systemic steroids: Secondary | ICD-10-CM | POA: Insufficient documentation

## 2014-02-22 MED ORDER — HYDROCODONE-ACETAMINOPHEN 5-325 MG PO TABS
ORAL_TABLET | ORAL | Status: AC
  Administered 2014-02-22: 1 via ORAL
  Filled 2014-02-22: qty 1

## 2014-02-22 MED ORDER — HYDROCODONE-ACETAMINOPHEN 5-325 MG PO TABS: 1.0000 | ORAL_TABLET | ORAL | Status: DC | PRN

## 2014-02-22 MED ORDER — HYDROCODONE-ACETAMINOPHEN 5-325 MG PO TABS: 1.0000 | ORAL_TABLET | Freq: Once | ORAL | Status: AC

## 2014-02-22 NOTE — ED Provider Notes (Signed)
CSN: [id number]     Arrival date & time 02/22/14  1703 History   First MD Initiated Contact with Patient 02/22/14 1849     Chief Complaint  Patient presents with  . Knee Pain     (Consider location/radiation/quality/duration/timing/severity/associated sxs/prior Treatment) HPI Brittany Durham is a 43 y.o. female who presents for evaluation of left knee pain that started several days ago. No trauma, or similar problem in the past. She denies fever, chills, nausea, vomiting, weakness, dizziness, back, pain or left hip pain. She works in a [employer]. She has not tried anything for the pain. There no other known modifying factors    Past Medical History  Diagnosis Date  . Hypertension    Past Surgical History  Procedure Laterality Date  . Tubal ligation     Family History  Problem Relation Age of Onset  . Hypertension Mother   . Hypertension Brother    History  Substance Use Topics  . Smoking status: Never Smoker   . Smokeless tobacco: Not on file  . Alcohol Use: No   OB History   Grav Para Term Preterm Abortions TAB SAB Ect Mult Living   1 1 1             Review of Systems  All other systems reviewed and are negative.     Allergies  Review of patient's allergies indicates no known allergies.  Home Medications   Prior to Admission medications   Medication Sig Start Date End Date Taking? Authorizing Provider  cetirizine (ZYRTEC) 10 MG tablet Take 1 tablet (10 mg total) by mouth daily. 11/29/12   Hope Bunnie Pion, NP  famotidine (PEPCID) 20 MG tablet Take 1 tablet (20 mg total) by mouth 2 (two) times daily. 11/29/12   Hope Bunnie Pion, NP  predniSONE (DELTASONE) 10 MG tablet Take 2 tablets (20 mg total) by mouth 2 (two) times daily. 11/29/12   Hope Bunnie Pion, NP  Valsartan (DIOVAN PO) Take 1 tablet by mouth daily.    Historical Provider, MD   BP 180/102  Pulse 96  Temp(Src) 97.7 F (36.5 C) (Oral)  Ht 5\' 7"  (1.702 m)  Wt 170 lb (77.111 kg)  BMI 26.62 kg/m2  SpO2  100%  LMP 02/17/2014 Physical Exam  Nursing note and vitals reviewed. Constitutional: She is oriented to person, place, and time. She appears well-developed and well-nourished. No distress.  HENT:  Head: Normocephalic and atraumatic.  Right Ear: External ear normal.  Left Ear: External ear normal.  Eyes: Conjunctivae and EOM are normal. Pupils are equal, round, and reactive to light.  Neck: Normal range of motion and phonation normal. Neck supple.  Pulmonary/Chest: Effort normal. She exhibits no bony tenderness.  Musculoskeletal: Normal range of motion.  Left knee with small palpable effusion. She resists flexion secondary to pain. Left knee is grossly stable to stress. There is no erythema of the knee.  Neurological: She is alert and oriented to person, place, and time. No cranial nerve deficit or sensory deficit. She exhibits normal muscle tone. Coordination normal.  Skin: Skin is warm, dry and intact.  Psychiatric: She has a normal mood and affect. Her behavior is normal. Judgment and thought content normal.    ED Course  Procedures (including critical care time)  Knee immobilizer applied by nursing staff  Findings discussed with patient. All questions answered.  Labs Review Labs Reviewed - No data to display  Imaging Review Dg Knee Complete 4 Views Left  02/22/2014   CLINICAL DATA:  Subacute left anterior knee pain with swelling. No recent injury. Initial encounter.  EXAM: LEFT KNEE - COMPLETE 4+ VIEW  COMPARISON:  None.  FINDINGS: No fracture of the proximal tibia or distal femur. Patella is normal. Small suprapatellar joint effusion.  IMPRESSION: Small joint effusion.  No osseous abnormality.   Electronically Signed   By: Suzy Bouchard M.D.   On: 02/22/2014 19:23     EKG Interpretation None      MDM   Final diagnoses:  Left knee pain    Nonspecific knee pain with effusion. Doubt septic arthritis, significant arthropathy or radiculopathy.   Nursing Notes  Reviewed/ Care Coordinated Applicable Imaging Reviewed Interpretation of Laboratory Data incorporated into ED treatment  The patient appears reasonably screened and/or stabilized for discharge and I doubt any other medical condition or other [facility] requiring further screening, evaluation, or treatment in the ED at this time prior to discharge.  Plan: Home Medications- IBU, Norco; Home Treatments- Knee Immobilizer when up, no work for 3 days; return here if the recommended treatment, does not improve the symptoms; Recommended follow up- Ortho prn     Richarda Blade, MD 02/22/14 1945

## 2014-02-22 NOTE — ED Notes (Addendum)
Patient complaining of left knee pain with swelling x 4 days. Denies recent injury. Patient's B/P 191/102 at triage. Patient states she has not had her blood pressure medication for "quite some time" due to financial reasons.

## 2014-02-22 NOTE — Discharge Instructions (Signed)

## 2014-02-22 NOTE — ED Notes (Signed)
PT arrived to room at this time from triage.

## 2014-03-15 ENCOUNTER — Encounter (HOSPITAL_COMMUNITY): Payer: Self-pay | Admitting: Emergency Medicine

## 2014-05-25 ENCOUNTER — Telehealth: Payer: Self-pay | Admitting: Family Medicine

## 2014-05-25 NOTE — Telephone Encounter (Signed)
Called to est care appt scheduled

## 2014-06-29 ENCOUNTER — Encounter: Payer: Self-pay | Admitting: Family

## 2014-06-29 ENCOUNTER — Ambulatory Visit (INDEPENDENT_AMBULATORY_CARE_PROVIDER_SITE_OTHER): Payer: 59 | Admitting: Family

## 2014-06-29 VITALS — BP 171/128 | HR 96 | Temp 98.1°F | Ht 67.0 in | Wt 187.0 lb

## 2014-06-29 DIAGNOSIS — I10 Essential (primary) hypertension: Secondary | ICD-10-CM

## 2014-06-29 DIAGNOSIS — K219 Gastro-esophageal reflux disease without esophagitis: Secondary | ICD-10-CM | POA: Diagnosis not present

## 2014-06-29 DIAGNOSIS — Z0000 Encounter for general adult medical examination without abnormal findings: Secondary | ICD-10-CM | POA: Diagnosis not present

## 2014-06-29 DIAGNOSIS — S83207S Unspecified tear of unspecified meniscus, current injury, left knee, sequela: Secondary | ICD-10-CM | POA: Diagnosis not present

## 2014-06-29 DIAGNOSIS — Z1321 Encounter for screening for nutritional disorder: Secondary | ICD-10-CM

## 2014-06-29 MED ORDER — OMEPRAZOLE 20 MG PO CPDR: 20.0000 mg | DELAYED_RELEASE_CAPSULE | Freq: Every day | ORAL | Status: DC

## 2014-06-29 MED ORDER — LISINOPRIL-HYDROCHLOROTHIAZIDE 20-12.5 MG PO TABS: 1.0000 | ORAL_TABLET | Freq: Every day | ORAL | Status: DC

## 2014-06-29 NOTE — Patient Instructions (Signed)
DASH Eating Plan DASH stands for "Dietary Approaches to Stop Hypertension." The DASH eating plan is a healthy eating plan that has been shown to reduce high blood pressure (hypertension). Additional health benefits may include reducing the risk of type 2 diabetes mellitus, heart disease, and stroke. The DASH eating plan may also help with weight loss. WHAT DO I NEED TO KNOW ABOUT THE DASH EATING PLAN? For the DASH eating plan, you will follow these general guidelines:  Choose foods with a percent daily value for sodium of less than 5% (as listed on the food label).  Use salt-free seasonings or herbs instead of table salt or sea salt.  Check with your health care provider or pharmacist before using salt substitutes.  Eat lower-sodium products, often labeled as "lower sodium" or "no salt added."  Eat fresh foods.  Eat more vegetables, fruits, and low-fat dairy products.  Choose whole grains. Look for the word "whole" as the first word in the ingredient list.  Choose fish and skinless chicken or turkey more often than red meat. Limit fish, poultry, and meat to 6 oz (170 g) each day.  Limit sweets, desserts, sugars, and sugary drinks.  Choose heart-healthy fats.  Limit cheese to 1 oz (28 g) per day.  Eat more home-cooked food and less restaurant, buffet, and fast food.  Limit fried foods.  Cook foods using methods other than frying.  Limit canned vegetables. If you do use them, rinse them well to decrease the sodium.  When eating at a restaurant, ask that your food be prepared with less salt, or no salt if possible. WHAT FOODS CAN I EAT? Seek help from a dietitian for individual calorie needs. Grains Whole grain or whole wheat bread. Brown rice. Whole grain or whole wheat pasta. Quinoa, bulgur, and whole grain cereals. Low-sodium cereals. Corn or whole wheat flour tortillas. Whole grain cornbread. Whole grain crackers. Low-sodium crackers. Vegetables Fresh or frozen vegetables  (raw, steamed, roasted, or grilled). Low-sodium or reduced-sodium tomato and vegetable juices. Low-sodium or reduced-sodium tomato sauce and paste. Low-sodium or reduced-sodium canned vegetables.  Fruits All fresh, canned (in natural juice), or frozen fruits. Meat and Other Protein Products Ground beef (85% or leaner), grass-fed beef, or beef trimmed of fat. Skinless chicken or turkey. Ground chicken or turkey. Pork trimmed of fat. All fish and seafood. Eggs. Dried beans, peas, or lentils. Unsalted nuts and seeds. Unsalted canned beans. Dairy Low-fat dairy products, such as skim or 1% milk, 2% or reduced-fat cheeses, low-fat ricotta or cottage cheese, or plain low-fat yogurt. Low-sodium or reduced-sodium cheeses. Fats and Oils Tub margarines without trans fats. Light or reduced-fat mayonnaise and salad dressings (reduced sodium). Avocado. Safflower, olive, or canola oils. Natural peanut or almond butter. Other Unsalted popcorn and pretzels. The items listed above may not be a complete list of recommended foods or beverages. Contact your dietitian for more options. WHAT FOODS ARE NOT RECOMMENDED? Grains White bread. White pasta. White rice. Refined cornbread. Bagels and croissants. Crackers that contain trans fat. Vegetables Creamed or fried vegetables. Vegetables in a cheese sauce. Regular canned vegetables. Regular canned tomato sauce and paste. Regular tomato and vegetable juices. Fruits Dried fruits. Canned fruit in light or heavy syrup. Fruit juice. Meat and Other Protein Products Fatty cuts of meat. Ribs, chicken wings, bacon, sausage, bologna, salami, chitterlings, fatback, hot dogs, bratwurst, and packaged luncheon meats. Salted nuts and seeds. Canned beans with salt. Dairy Whole or 2% milk, cream, half-and-half, and cream cheese. Whole-fat or sweetened yogurt. Full-fat   cheeses or blue cheese. Nondairy creamers and whipped toppings. Processed cheese, cheese spreads, or cheese  curds. Condiments Onion and garlic salt, seasoned salt, table salt, and sea salt. Canned and packaged gravies. Worcestershire sauce. Tartar sauce. Barbecue sauce. Teriyaki sauce. Soy sauce, including reduced sodium. Steak sauce. Fish sauce. Oyster sauce. Cocktail sauce. Horseradish. Ketchup and mustard. Meat flavorings and tenderizers. Bouillon cubes. Hot sauce. Tabasco sauce. Marinades. Taco seasonings. Relishes. Fats and Oils Butter, stick margarine, lard, shortening, ghee, and bacon fat. Coconut, palm kernel, or palm oils. Regular salad dressings. Other Pickles and olives. Salted popcorn and pretzels. The items listed above may not be a complete list of foods and beverages to avoid. Contact your dietitian for more information. WHERE CAN I FIND MORE INFORMATION? National Heart, Lung, and Blood Institute: www.nhlbi.nih.gov/health/health-topics/topics/dash/ Document Released: 04/19/2011 Document Revised: 09/14/2013 Document Reviewed: 03/04/2013 ExitCare Patient Information 2015 [employer]. This information is not intended to replace advice given to you by your health care provider. Make sure you discuss any questions you have with your health care provider. Hypertension Hypertension, commonly called high blood pressure, is when the force of blood pumping through your arteries is too strong. Your arteries are the blood vessels that carry blood from your heart throughout your body. A blood pressure reading consists of a higher number over a lower number, such as 110/72. The higher number (systolic) is the pressure inside your arteries when your heart pumps. The lower number (diastolic) is the pressure inside your arteries when your heart relaxes. Ideally you want your blood pressure below 120/80. Hypertension forces your heart to work harder to pump blood. Your arteries may become narrow or stiff. Having hypertension puts you at risk for heart disease, stroke, and other problems.  RISK  FACTORS Some risk factors for high blood pressure are controllable. Others are not.  Risk factors you cannot control include:   Race. You may be at higher risk if you are African American.  Age. Risk increases with age.  Gender. Men are at higher risk than women before age 45 years. After age 65, women are at higher risk than men. Risk factors you can control include:  Not getting enough exercise or physical activity.  Being overweight.  Getting too much fat, sugar, calories, or salt in your diet.  Drinking too much alcohol. SIGNS AND SYMPTOMS Hypertension does not usually cause signs or symptoms. Extremely high blood pressure (hypertensive crisis) may cause headache, anxiety, shortness of breath, and nosebleed. DIAGNOSIS  To check if you have hypertension, your health care provider will measure your blood pressure while you are seated, with your arm held at the level of your heart. It should be measured at least twice using the same arm. Certain conditions can cause a difference in blood pressure between your right and left arms. A blood pressure reading that is higher than normal on one occasion does not mean that you need treatment. If one blood pressure reading is high, ask your health care provider about having it checked again. TREATMENT  Treating high blood pressure includes making lifestyle changes and possibly taking medicine. Living a healthy lifestyle can help lower high blood pressure. You may need to change some of your habits. Lifestyle changes may include:  Following the DASH diet. This diet is high in fruits, vegetables, and whole grains. It is low in salt, red meat, and added sugars.  Getting at least 2 hours of brisk physical activity every week.  Losing weight if necessary.  Not smoking.  Limiting   alcoholic beverages.  Learning ways to reduce stress. If lifestyle changes are not enough to get your blood pressure under control, your health care provider may  prescribe medicine. You may need to take more than one. Work closely with your health care provider to understand the risks and benefits. HOME CARE INSTRUCTIONS  Have your blood pressure rechecked as directed by your health care provider.   Take medicines only as directed by your health care provider. Follow the directions carefully. Blood pressure medicines must be taken as prescribed. The medicine does not work as well when you skip doses. Skipping doses also puts you at risk for problems.   Do not smoke.   Monitor your blood pressure at home as directed by your health care provider. SEEK MEDICAL CARE IF:   You think you are having a reaction to medicines taken.  You have recurrent headaches or feel dizzy.  You have swelling in your ankles.  You have trouble with your vision. SEEK IMMEDIATE MEDICAL CARE IF:  You develop a severe headache or confusion.  You have unusual weakness, numbness, or feel faint.  You have severe chest or abdominal pain.  You vomit repeatedly.  You have trouble breathing. MAKE SURE YOU:   Understand these instructions.  Will watch your condition.  Will get help right away if you are not doing well or get worse. Document Released: 04/30/2005 Document Revised: 09/14/2013 Document Reviewed: 02/20/2013 ExitCare Patient Information 2015 [employer]. This information is not intended to replace advice given to you by your health care provider. Make sure you discuss any questions you have with your health care provider.  

## 2014-06-29 NOTE — Progress Notes (Signed)
Subjective:    Patient ID: Brittany Durham, female    DOB: 21-May-1970, 44 y.o.   MRN: [id number]  Hypertension This is a chronic problem. The current episode started more than 1 year ago. The problem has been waxing and waning since onset. The problem is uncontrolled. Pertinent negatives include no anxiety, headaches, palpitations, peripheral edema, shortness of breath or sweats. Past treatments include nothing. The current treatment provides no improvement. Compliance problems: Pt has not had a PCP.  There is no history of kidney disease, CAD/MI, CVA, heart failure or a thyroid problem. There is no history of sleep apnea.  Gastrophageal Reflux She reports no belching, no choking, no coughing, no heartburn or no sore throat. This is a chronic problem. The current episode started more than 1 year ago. The problem occurs rarely. The problem has been waxing and waning. The symptoms are aggravated by certain foods. She has tried a diet change for the symptoms. The treatment provided mild relief.      Review of Systems  Constitutional: Negative.   HENT: Negative.  Negative for sore throat.   Eyes: Negative.   Respiratory: Negative.  Negative for cough, choking and shortness of breath.   Cardiovascular: Negative.  Negative for palpitations.  Gastrointestinal: Negative.  Negative for heartburn.  Endocrine: Negative.   Genitourinary: Negative.   Musculoskeletal: Negative.   Neurological: Negative.  Negative for headaches.  Hematological: Negative.   Psychiatric/Behavioral: Negative.   All other systems reviewed and are negative.      Objective:   Physical Exam  Constitutional: She is oriented to person, place, and time. She appears well-developed and well-nourished. No distress.  HENT:  Head: Normocephalic and atraumatic.  Right Ear: External ear normal.  Left Ear: External ear normal.  Nose: Nose normal.  Mouth/Throat: Oropharynx is clear and moist.  Eyes: Pupils are equal, round, and  reactive to light.  Neck: Normal range of motion. Neck supple. No thyromegaly present.  Cardiovascular: Normal rate, regular rhythm, normal heart sounds and intact distal pulses.   No murmur heard. Pulmonary/Chest: Effort normal and breath sounds normal. No respiratory distress. She has no wheezes.  Abdominal: Soft. Bowel sounds are normal. She exhibits no distension. There is no tenderness.  Musculoskeletal: Normal range of motion. She exhibits no edema or tenderness.  Neurological: She is alert and oriented to person, place, and time. She has normal reflexes. No cranial nerve deficit.  Skin: Skin is warm and dry.  Psychiatric: She has a normal mood and affect. Her behavior is normal. Judgment and thought content normal.  Vitals reviewed.   BP 171/128 mmHg  Pulse 96  Temp(Src) 98.1 F (36.7 C) (Oral)  Ht 5' 7"  (1.702 m)  Wt 187 lb (84.823 kg)  BMI 29.28 kg/m2       Assessment & Plan:  1. HYPERTENSION, BENIGN --Daily blood pressure log given with instructions on how to fill out and told to bring to next visit -Dash diet information given -Exercise encouraged - Stress Management  -Continue current meds -RTO in 2 weeks - CMP14+EGFR - lisinopril-hydrochlorothiazide (ZESTORETIC) 20-12.5 MG per tablet; Take 1 tablet by mouth daily.  Dispense: 90 tablet; Refill: 1  2. Gastroesophageal reflux disease, esophagitis presence not specified - CMP14+EGFR - omeprazole (PRILOSEC) 20 MG capsule; Take 1 capsule (20 mg total) by mouth daily.  Dispense: 90 capsule; Refill: 4  3. Annual physical exam - CMP14+EGFR - Lipid panel - Thyroid Panel With TSH - Vit D  25 hydroxy (rtn osteoporosis monitoring)  4.  Encounter for vitamin deficiency screening - Vit D  25 hydroxy (rtn osteoporosis monitoring)  5. Knee torn cartilage, left, sequela - Ambulatory referral to Orthopedic Surgery   Continue all meds Labs pending Health Maintenance reviewed Diet and exercise encouraged RTO 2 weeks    Evelina Dun, FNP

## 2014-06-30 ENCOUNTER — Telehealth: Payer: Self-pay | Admitting: *Deleted

## 2014-06-30 ENCOUNTER — Other Ambulatory Visit: Payer: Self-pay | Admitting: Family

## 2014-06-30 DIAGNOSIS — E559 Vitamin D deficiency, unspecified: Secondary | ICD-10-CM

## 2014-06-30 LAB — CMP14+EGFR
ALT: 14 IU/L (ref 0–32)
AST: 13 IU/L (ref 0–40)
Albumin/Globulin Ratio: 1.5 (ref 1.1–2.5)
Albumin: 4.6 g/dL (ref 3.5–5.5)
Alkaline Phosphatase: 71 IU/L (ref 39–117)
BUN/Creatinine Ratio: 19 (ref 9–23)
BUN: 14 mg/dL (ref 6–24)
Bilirubin Total: 0.4 mg/dL (ref 0.0–1.2)
CO2: 24 mmol/L (ref 18–29)
Calcium: 9.3 mg/dL (ref 8.7–10.2)
Chloride: 101 mmol/L (ref 97–108)
Creatinine, Ser: 0.74 mg/dL (ref 0.57–1.00)
GFR calc Af Amer: 113 mL/min/{1.73_m2} (ref >59)
GFR calc non Af Amer: 98 mL/min/{1.73_m2} (ref >59)
Globulin, Total: 3 g/dL (ref 1.5–4.5)
Glucose: 97 mg/dL (ref 65–99)
Potassium: 4.1 mmol/L (ref 3.5–5.2)
Sodium: 141 mmol/L (ref 134–144)
Total Protein: 7.6 g/dL (ref 6.0–8.5)

## 2014-06-30 LAB — THYROID PANEL WITH TSH
Free Thyroxine Index: 2.1 (ref 1.2–4.9)
T3 Uptake Ratio: 21 % — ABNORMAL LOW (ref 24–39)
T4, Total: 9.8 ug/dL (ref 4.5–12.0)
TSH: 1.83 u[IU]/mL (ref 0.450–4.500)

## 2014-06-30 LAB — LIPID PANEL
Chol/HDL Ratio: 2.7 ratio units (ref 0.0–4.4)
Cholesterol, Total: 162 mg/dL (ref 100–199)
HDL: 60 mg/dL (ref >39)
LDL Calculated: 89 mg/dL (ref 0–99)
Triglycerides: 65 mg/dL (ref 0–149)
VLDL Cholesterol Cal: 13 mg/dL (ref 5–40)

## 2014-06-30 LAB — VITAMIN D 25 HYDROXY (VIT D DEFICIENCY, FRACTURES): Vit D, 25-Hydroxy: 4 ng/mL — ABNORMAL LOW (ref 30.0–100.0)

## 2014-06-30 MED ORDER — VITAMIN D (ERGOCALCIFEROL) 1.25 MG (50000 UNIT) PO CAPS: 50000.0000 [IU] | ORAL_CAPSULE | ORAL | Status: DC

## 2014-06-30 NOTE — Telephone Encounter (Signed)
Patient notified of lab results

## 2014-06-30 NOTE — Telephone Encounter (Signed)
-----   Message from Sharion Balloon, [place] sent at 06/30/2014 10:19 AM EST ----- Kidney and liver function stable Cholesterol levels WNL Thyroid levels WNL Vit D very low- Prescription sent to pharmacy

## 2014-07-13 ENCOUNTER — Ambulatory Visit: Payer: 59 | Admitting: Family

## 2014-07-19 ENCOUNTER — Encounter: Payer: Self-pay | Admitting: Family

## 2014-07-19 ENCOUNTER — Ambulatory Visit (INDEPENDENT_AMBULATORY_CARE_PROVIDER_SITE_OTHER): Payer: 59 | Admitting: Family

## 2014-07-19 VITALS — BP 150/94 | HR 95 | Temp 98.0°F | Ht 67.0 in | Wt 189.2 lb

## 2014-07-19 DIAGNOSIS — I10 Essential (primary) hypertension: Secondary | ICD-10-CM | POA: Diagnosis not present

## 2014-07-19 MED ORDER — LISINOPRIL-HYDROCHLOROTHIAZIDE 20-12.5 MG PO TABS: 1.0000 | ORAL_TABLET | Freq: Every day | ORAL | Status: DC

## 2014-07-19 MED ORDER — LISINOPRIL-HYDROCHLOROTHIAZIDE 20-12.5 MG PO TABS: 2.0000 | ORAL_TABLET | Freq: Every day | ORAL | Status: DC

## 2014-07-19 NOTE — Progress Notes (Signed)
   Subjective:    Patient ID: Brittany Durham, female    DOB: December 12, 1970, 44 y.o.   MRN: [id number]  PT presents to the office to recheck BP. Pt's BP is not at goal today. But has improved since her last visit. Hypertension This is a chronic problem. The current episode started more than 1 year ago. The problem is unchanged. The problem is uncontrolled. Pertinent negatives include no anxiety, blurred vision, headaches, malaise/fatigue, palpitations, peripheral edema or shortness of breath. Risk factors for coronary artery disease include sedentary lifestyle. Past treatments include ACE inhibitors. The current treatment provides mild improvement. There is no history of kidney disease, CAD/MI, CVA, heart failure or a thyroid problem. There is no history of sleep apnea.      Review of Systems  Constitutional: Negative.  Negative for malaise/fatigue.  HENT: Negative.   Eyes: Negative.  Negative for blurred vision.  Respiratory: Negative.  Negative for shortness of breath.   Cardiovascular: Negative.  Negative for palpitations.  Gastrointestinal: Negative.   Endocrine: Negative.   Genitourinary: Negative.   Musculoskeletal: Negative.   Neurological: Negative.  Negative for headaches.  Hematological: Negative.   Psychiatric/Behavioral: Negative.   All other systems reviewed and are negative.      Objective:   Physical Exam  Constitutional: She is oriented to person, place, and time. She appears well-developed and well-nourished. No distress.  HENT:  Head: Normocephalic and atraumatic.  Right Ear: External ear normal.  Left Ear: External ear normal.  Nose: Nose normal.  Mouth/Throat: Oropharynx is clear and moist.  Eyes: Pupils are equal, round, and reactive to light.  Neck: Normal range of motion. Neck supple. No thyromegaly present.  Cardiovascular: Normal rate, regular rhythm, normal heart sounds and intact distal pulses.   No murmur heard. Pulmonary/Chest: Effort normal and breath  sounds normal. No respiratory distress. She has no wheezes.  Abdominal: Soft. Bowel sounds are normal. She exhibits no distension. There is no tenderness.  Musculoskeletal: Normal range of motion. She exhibits no edema or tenderness.  Neurological: She is alert and oriented to person, place, and time. She has normal reflexes. No cranial nerve deficit.  Skin: Skin is warm and dry.  Psychiatric: She has a normal mood and affect. Her behavior is normal. Judgment and thought content normal.  Vitals reviewed.  BP 150/94 mmHg  Pulse 95  Temp(Src) 98 F (36.7 C) (Oral)  Ht 5\' 7"  (1.702 m)  Wt 189 lb 3.2 oz (85.821 kg)  BMI 29.63 kg/m2        Assessment & Plan:  1. HYPERTENSION, BENIGN --Zestoretic increased to 2 tabs daily (lisinopril 40/HCTZ25- from 20-12.5mg ) Daily blood pressure log given with instructions on how to fill out and told to bring to next visit -Dash diet information given -Exercise encouraged - Stress Management  -Continue current meds -RTO in 2 weeks - BMP8+EGFR - lisinopril-hydrochlorothiazide (ZESTORETIC) 20-12.5 MG per tablet; Take 2 tablets by mouth daily.  Dispense: 180 tablet; Refill: [place], FNP

## 2014-07-19 NOTE — Patient Instructions (Signed)
DASH Eating Plan DASH stands for "Dietary Approaches to Stop Hypertension." The DASH eating plan is a healthy eating plan that has been shown to reduce high blood pressure (hypertension). Additional health benefits may include reducing the risk of type 2 diabetes mellitus, heart disease, and stroke. The DASH eating plan may also help with weight loss. WHAT DO I NEED TO KNOW ABOUT THE DASH EATING PLAN? For the DASH eating plan, you will follow these general guidelines:  Choose foods with a percent daily value for sodium of less than 5% (as listed on the food label).  Use salt-free seasonings or herbs instead of table salt or sea salt.  Check with your health care provider or pharmacist before using salt substitutes.  Eat lower-sodium products, often labeled as "lower sodium" or "no salt added."  Eat fresh foods.  Eat more vegetables, fruits, and low-fat dairy products.  Choose whole grains. Look for the word "whole" as the first word in the ingredient list.  Choose fish and skinless chicken or turkey more often than red meat. Limit fish, poultry, and meat to 6 oz (170 g) each day.  Limit sweets, desserts, sugars, and sugary drinks.  Choose heart-healthy fats.  Limit cheese to 1 oz (28 g) per day.  Eat more home-cooked food and less restaurant, buffet, and fast food.  Limit fried foods.  Cook foods using methods other than frying.  Limit canned vegetables. If you do use them, rinse them well to decrease the sodium.  When eating at a restaurant, ask that your food be prepared with less salt, or no salt if possible. WHAT FOODS CAN I EAT? Seek help from a dietitian for individual calorie needs. Grains Whole grain or whole wheat bread. Brown rice. Whole grain or whole wheat pasta. Quinoa, bulgur, and whole grain cereals. Low-sodium cereals. Corn or whole wheat flour tortillas. Whole grain cornbread. Whole grain crackers. Low-sodium crackers. Vegetables Fresh or frozen vegetables  (raw, steamed, roasted, or grilled). Low-sodium or reduced-sodium tomato and vegetable juices. Low-sodium or reduced-sodium tomato sauce and paste. Low-sodium or reduced-sodium canned vegetables.  Fruits All fresh, canned (in natural juice), or frozen fruits. Meat and Other Protein Products Ground beef (85% or leaner), grass-fed beef, or beef trimmed of fat. Skinless chicken or turkey. Ground chicken or turkey. Pork trimmed of fat. All fish and seafood. Eggs. Dried beans, peas, or lentils. Unsalted nuts and seeds. Unsalted canned beans. Dairy Low-fat dairy products, such as skim or 1% milk, 2% or reduced-fat cheeses, low-fat ricotta or cottage cheese, or plain low-fat yogurt. Low-sodium or reduced-sodium cheeses. Fats and Oils Tub margarines without trans fats. Light or reduced-fat mayonnaise and salad dressings (reduced sodium). Avocado. Safflower, olive, or canola oils. Natural peanut or almond butter. Other Unsalted popcorn and pretzels. The items listed above may not be a complete list of recommended foods or beverages. Contact your dietitian for more options. WHAT FOODS ARE NOT RECOMMENDED? Grains White bread. White pasta. White rice. Refined cornbread. Bagels and croissants. Crackers that contain trans fat. Vegetables Creamed or fried vegetables. Vegetables in a cheese sauce. Regular canned vegetables. Regular canned tomato sauce and paste. Regular tomato and vegetable juices. Fruits Dried fruits. Canned fruit in light or heavy syrup. Fruit juice. Meat and Other Protein Products Fatty cuts of meat. Ribs, chicken wings, bacon, sausage, bologna, salami, chitterlings, fatback, hot dogs, bratwurst, and packaged luncheon meats. Salted nuts and seeds. Canned beans with salt. Dairy Whole or 2% milk, cream, half-and-half, and cream cheese. Whole-fat or sweetened yogurt. Full-fat   cheeses or blue cheese. Nondairy creamers and whipped toppings. Processed cheese, cheese spreads, or cheese  curds. Condiments Onion and garlic salt, seasoned salt, table salt, and sea salt. Canned and packaged gravies. Worcestershire sauce. Tartar sauce. Barbecue sauce. Teriyaki sauce. Soy sauce, including reduced sodium. Steak sauce. Fish sauce. Oyster sauce. Cocktail sauce. Horseradish. Ketchup and mustard. Meat flavorings and tenderizers. Bouillon cubes. Hot sauce. Tabasco sauce. Marinades. Taco seasonings. Relishes. Fats and Oils Butter, stick margarine, lard, shortening, ghee, and bacon fat. Coconut, palm kernel, or palm oils. Regular salad dressings. Other Pickles and olives. Salted popcorn and pretzels. The items listed above may not be a complete list of foods and beverages to avoid. Contact your dietitian for more information. WHERE CAN I FIND MORE INFORMATION? National Heart, Lung, and Blood Institute: www.nhlbi.nih.gov/health/health-topics/topics/dash/ Document Released: 04/19/2011 Document Revised: 09/14/2013 Document Reviewed: 03/04/2013 ExitCare Patient Information 2015 [employer]. This information is not intended to replace advice given to you by your health care provider. Make sure you discuss any questions you have with your health care provider. Hypertension Hypertension, commonly called high blood pressure, is when the force of blood pumping through your arteries is too strong. Your arteries are the blood vessels that carry blood from your heart throughout your body. A blood pressure reading consists of a higher number over a lower number, such as 110/72. The higher number (systolic) is the pressure inside your arteries when your heart pumps. The lower number (diastolic) is the pressure inside your arteries when your heart relaxes. Ideally you want your blood pressure below 120/80. Hypertension forces your heart to work harder to pump blood. Your arteries may become narrow or stiff. Having hypertension puts you at risk for heart disease, stroke, and other problems.  RISK  FACTORS Some risk factors for high blood pressure are controllable. Others are not.  Risk factors you cannot control include:   Race. You may be at higher risk if you are African American.  Age. Risk increases with age.  Gender. Men are at higher risk than women before age 45 years. After age 65, women are at higher risk than men. Risk factors you can control include:  Not getting enough exercise or physical activity.  Being overweight.  Getting too much fat, sugar, calories, or salt in your diet.  Drinking too much alcohol. SIGNS AND SYMPTOMS Hypertension does not usually cause signs or symptoms. Extremely high blood pressure (hypertensive crisis) may cause headache, anxiety, shortness of breath, and nosebleed. DIAGNOSIS  To check if you have hypertension, your health care provider will measure your blood pressure while you are seated, with your arm held at the level of your heart. It should be measured at least twice using the same arm. Certain conditions can cause a difference in blood pressure between your right and left arms. A blood pressure reading that is higher than normal on one occasion does not mean that you need treatment. If one blood pressure reading is high, ask your health care provider about having it checked again. TREATMENT  Treating high blood pressure includes making lifestyle changes and possibly taking medicine. Living a healthy lifestyle can help lower high blood pressure. You may need to change some of your habits. Lifestyle changes may include:  Following the DASH diet. This diet is high in fruits, vegetables, and whole grains. It is low in salt, red meat, and added sugars.  Getting at least 2 hours of brisk physical activity every week.  Losing weight if necessary.  Not smoking.  Limiting   alcoholic beverages.  Learning ways to reduce stress. If lifestyle changes are not enough to get your blood pressure under control, your health care provider may  prescribe medicine. You may need to take more than one. Work closely with your health care provider to understand the risks and benefits. HOME CARE INSTRUCTIONS  Have your blood pressure rechecked as directed by your health care provider.   Take medicines only as directed by your health care provider. Follow the directions carefully. Blood pressure medicines must be taken as prescribed. The medicine does not work as well when you skip doses. Skipping doses also puts you at risk for problems.   Do not smoke.   Monitor your blood pressure at home as directed by your health care provider. SEEK MEDICAL CARE IF:   You think you are having a reaction to medicines taken.  You have recurrent headaches or feel dizzy.  You have swelling in your ankles.  You have trouble with your vision. SEEK IMMEDIATE MEDICAL CARE IF:  You develop a severe headache or confusion.  You have unusual weakness, numbness, or feel faint.  You have severe chest or abdominal pain.  You vomit repeatedly.  You have trouble breathing. MAKE SURE YOU:   Understand these instructions.  Will watch your condition.  Will get help right away if you are not doing well or get worse. Document Released: 04/30/2005 Document Revised: 09/14/2013 Document Reviewed: 02/20/2013 ExitCare Patient Information 2015 [employer]. This information is not intended to replace advice given to you by your health care provider. Make sure you discuss any questions you have with your health care provider.  

## 2014-07-20 ENCOUNTER — Encounter: Payer: Self-pay | Admitting: *Deleted

## 2014-07-20 LAB — BMP8+EGFR
BUN/Creatinine Ratio: 17 (ref 9–23)
BUN: 12 mg/dL (ref 6–24)
CO2: 23 mmol/L (ref 18–29)
Calcium: 9.7 mg/dL (ref 8.7–10.2)
Chloride: 101 mmol/L (ref 97–108)
Creatinine, Ser: 0.72 mg/dL (ref 0.57–1.00)
GFR calc Af Amer: 117 mL/min/{1.73_m2} (ref >59)
GFR calc non Af Amer: 101 mL/min/{1.73_m2} (ref >59)
Glucose: 93 mg/dL (ref 65–99)
Potassium: 4.2 mmol/L (ref 3.5–5.2)
Sodium: 139 mmol/L (ref 134–144)

## 2014-07-26 ENCOUNTER — Other Ambulatory Visit (HOSPITAL_COMMUNITY): Payer: Self-pay | Admitting: Orthopaedic Surgery

## 2014-07-26 DIAGNOSIS — M25562 Pain in left knee: Secondary | ICD-10-CM

## 2014-08-03 ENCOUNTER — Ambulatory Visit (HOSPITAL_COMMUNITY)
Admission: RE | Admit: 2014-08-03 | Discharge: 2014-08-03 | Disposition: A | Discharge status: Home / Self Care | Payer: 59 | Source: Ambulatory Visit | Attending: Orthopaedic Surgery | Admitting: Orthopaedic Surgery

## 2014-08-03 ENCOUNTER — Other Ambulatory Visit (HOSPITAL_COMMUNITY): Payer: Self-pay

## 2014-08-03 ENCOUNTER — Ambulatory Visit: Payer: 59 | Admitting: Family

## 2014-08-03 DIAGNOSIS — M25562 Pain in left knee: Secondary | ICD-10-CM | POA: Diagnosis not present

## 2014-08-05 ENCOUNTER — Ambulatory Visit: Payer: 59 | Admitting: Family

## 2014-08-20 ENCOUNTER — Ambulatory Visit (INDEPENDENT_AMBULATORY_CARE_PROVIDER_SITE_OTHER): Payer: 59 | Admitting: Family

## 2014-08-20 ENCOUNTER — Encounter: Payer: Self-pay | Admitting: Family

## 2014-08-20 VITALS — BP 166/99 | HR 96 | Temp 97.2°F | Ht 67.0 in | Wt 189.4 lb

## 2014-08-20 DIAGNOSIS — I10 Essential (primary) hypertension: Secondary | ICD-10-CM | POA: Diagnosis not present

## 2014-08-20 MED ORDER — AMLODIPINE BESYLATE 10 MG PO TABS
10.0000 mg | ORAL_TABLET | Freq: Every day | ORAL | Status: DC
Start: 2014-08-20 — End: 2016-05-04

## 2014-08-20 NOTE — Patient Instructions (Signed)
DASH Eating Plan DASH stands for "Dietary Approaches to Stop Hypertension." The DASH eating plan is a healthy eating plan that has been shown to reduce high blood pressure (hypertension). Additional health benefits may include reducing the risk of type 2 diabetes mellitus, heart disease, and stroke. The DASH eating plan may also help with weight loss. WHAT DO I NEED TO KNOW ABOUT THE DASH EATING PLAN? For the DASH eating plan, you will follow these general guidelines:  Choose foods with a percent daily value for sodium of less than 5% (as listed on the food label).  Use salt-free seasonings or herbs instead of table salt or sea salt.  Check with your health care provider or pharmacist before using salt substitutes.  Eat lower-sodium products, often labeled as "lower sodium" or "no salt added."  Eat fresh foods.  Eat more vegetables, fruits, and low-fat dairy products.  Choose whole grains. Look for the word "whole" as the first word in the ingredient list.  Choose fish and skinless chicken or turkey more often than red meat. Limit fish, poultry, and meat to 6 oz (170 g) each day.  Limit sweets, desserts, sugars, and sugary drinks.  Choose heart-healthy fats.  Limit cheese to 1 oz (28 g) per day.  Eat more home-cooked food and less restaurant, buffet, and fast food.  Limit fried foods.  Cook foods using methods other than frying.  Limit canned vegetables. If you do use them, rinse them well to decrease the sodium.  When eating at a restaurant, ask that your food be prepared with less salt, or no salt if possible. WHAT FOODS CAN I EAT? Seek help from a dietitian for individual calorie needs. Grains Whole grain or whole wheat bread. Brown rice. Whole grain or whole wheat pasta. Quinoa, bulgur, and whole grain cereals. Low-sodium cereals. Corn or whole wheat flour tortillas. Whole grain cornbread. Whole grain crackers. Low-sodium crackers. Vegetables Fresh or frozen vegetables  (raw, steamed, roasted, or grilled). Low-sodium or reduced-sodium tomato and vegetable juices. Low-sodium or reduced-sodium tomato sauce and paste. Low-sodium or reduced-sodium canned vegetables.  Fruits All fresh, canned (in natural juice), or frozen fruits. Meat and Other Protein Products Ground beef (85% or leaner), grass-fed beef, or beef trimmed of fat. Skinless chicken or turkey. Ground chicken or turkey. Pork trimmed of fat. All fish and seafood. Eggs. Dried beans, peas, or lentils. Unsalted nuts and seeds. Unsalted canned beans. Dairy Low-fat dairy products, such as skim or 1% milk, 2% or reduced-fat cheeses, low-fat ricotta or cottage cheese, or plain low-fat yogurt. Low-sodium or reduced-sodium cheeses. Fats and Oils Tub margarines without trans fats. Light or reduced-fat mayonnaise and salad dressings (reduced sodium). Avocado. Safflower, olive, or canola oils. Natural peanut or almond butter. Other Unsalted popcorn and pretzels. The items listed above may not be a complete list of recommended foods or beverages. Contact your dietitian for more options. WHAT FOODS ARE NOT RECOMMENDED? Grains White bread. White pasta. White rice. Refined cornbread. Bagels and croissants. Crackers that contain trans fat. Vegetables Creamed or fried vegetables. Vegetables in a cheese sauce. Regular canned vegetables. Regular canned tomato sauce and paste. Regular tomato and vegetable juices. Fruits Dried fruits. Canned fruit in light or heavy syrup. Fruit juice. Meat and Other Protein Products Fatty cuts of meat. Ribs, chicken wings, bacon, sausage, bologna, salami, chitterlings, fatback, hot dogs, bratwurst, and packaged luncheon meats. Salted nuts and seeds. Canned beans with salt. Dairy Whole or 2% milk, cream, half-and-half, and cream cheese. Whole-fat or sweetened yogurt. Full-fat   cheeses or blue cheese. Nondairy creamers and whipped toppings. Processed cheese, cheese spreads, or cheese  curds. Condiments Onion and garlic salt, seasoned salt, table salt, and sea salt. Canned and packaged gravies. Worcestershire sauce. Tartar sauce. Barbecue sauce. Teriyaki sauce. Soy sauce, including reduced sodium. Steak sauce. Fish sauce. Oyster sauce. Cocktail sauce. Horseradish. Ketchup and mustard. Meat flavorings and tenderizers. Bouillon cubes. Hot sauce. Tabasco sauce. Marinades. Taco seasonings. Relishes. Fats and Oils Butter, stick margarine, lard, shortening, ghee, and bacon fat. Coconut, palm kernel, or palm oils. Regular salad dressings. Other Pickles and olives. Salted popcorn and pretzels. The items listed above may not be a complete list of foods and beverages to avoid. Contact your dietitian for more information. WHERE CAN I FIND MORE INFORMATION? National Heart, Lung, and Blood Institute: www.nhlbi.nih.gov/health/health-topics/topics/dash/ Document Released: 04/19/2011 Document Revised: 09/14/2013 Document Reviewed: 03/04/2013 ExitCare Patient Information 2015 [employer]. This information is not intended to replace advice given to you by your health care provider. Make sure you discuss any questions you have with your health care provider. Hypertension Hypertension, commonly called high blood pressure, is when the force of blood pumping through your arteries is too strong. Your arteries are the blood vessels that carry blood from your heart throughout your body. A blood pressure reading consists of a higher number over a lower number, such as 110/72. The higher number (systolic) is the pressure inside your arteries when your heart pumps. The lower number (diastolic) is the pressure inside your arteries when your heart relaxes. Ideally you want your blood pressure below 120/80. Hypertension forces your heart to work harder to pump blood. Your arteries may become narrow or stiff. Having hypertension puts you at risk for heart disease, stroke, and other problems.  RISK  FACTORS Some risk factors for high blood pressure are controllable. Others are not.  Risk factors you cannot control include:   Race. You may be at higher risk if you are African American.  Age. Risk increases with age.  Gender. Men are at higher risk than women before age 45 years. After age 65, women are at higher risk than men. Risk factors you can control include:  Not getting enough exercise or physical activity.  Being overweight.  Getting too much fat, sugar, calories, or salt in your diet.  Drinking too much alcohol. SIGNS AND SYMPTOMS Hypertension does not usually cause signs or symptoms. Extremely high blood pressure (hypertensive crisis) may cause headache, anxiety, shortness of breath, and nosebleed. DIAGNOSIS  To check if you have hypertension, your health care provider will measure your blood pressure while you are seated, with your arm held at the level of your heart. It should be measured at least twice using the same arm. Certain conditions can cause a difference in blood pressure between your right and left arms. A blood pressure reading that is higher than normal on one occasion does not mean that you need treatment. If one blood pressure reading is high, ask your health care provider about having it checked again. TREATMENT  Treating high blood pressure includes making lifestyle changes and possibly taking medicine. Living a healthy lifestyle can help lower high blood pressure. You may need to change some of your habits. Lifestyle changes may include:  Following the DASH diet. This diet is high in fruits, vegetables, and whole grains. It is low in salt, red meat, and added sugars.  Getting at least 2 hours of brisk physical activity every week.  Losing weight if necessary.  Not smoking.  Limiting   alcoholic beverages.  Learning ways to reduce stress. If lifestyle changes are not enough to get your blood pressure under control, your health care provider may  prescribe medicine. You may need to take more than one. Work closely with your health care provider to understand the risks and benefits. HOME CARE INSTRUCTIONS  Have your blood pressure rechecked as directed by your health care provider.   Take medicines only as directed by your health care provider. Follow the directions carefully. Blood pressure medicines must be taken as prescribed. The medicine does not work as well when you skip doses. Skipping doses also puts you at risk for problems.   Do not smoke.   Monitor your blood pressure at home as directed by your health care provider. SEEK MEDICAL CARE IF:   You think you are having a reaction to medicines taken.  You have recurrent headaches or feel dizzy.  You have swelling in your ankles.  You have trouble with your vision. SEEK IMMEDIATE MEDICAL CARE IF:  You develop a severe headache or confusion.  You have unusual weakness, numbness, or feel faint.  You have severe chest or abdominal pain.  You vomit repeatedly.  You have trouble breathing. MAKE SURE YOU:   Understand these instructions.  Will watch your condition.  Will get help right away if you are not doing well or get worse. Document Released: 04/30/2005 Document Revised: 09/14/2013 Document Reviewed: 02/20/2013 ExitCare Patient Information 2015 [employer]. This information is not intended to replace advice given to you by your health care provider. Make sure you discuss any questions you have with your health care provider.  

## 2014-08-20 NOTE — Progress Notes (Signed)
   Subjective:    Patient ID: Brittany Durham, female    DOB: 05-26-1970, 44 y.o.   MRN: [id number]  Pt presents to the office today to recheck BP. Pt's BP is not at goal today.  Pt states she is worried about her father. States her dad has stage 4 cancer. Hypertension This is a chronic problem. The current episode started more than 1 year ago. The problem has been waxing and waning since onset. The problem is uncontrolled. Pertinent negatives include no anxiety, headaches, palpitations, peripheral edema or shortness of breath. Risk factors for coronary artery disease include obesity. Past treatments include ACE inhibitors and diuretics. The current treatment provides no improvement. There is no history of kidney disease, CAD/MI, CVA, heart failure or a thyroid problem. There is no history of sleep apnea.      Review of Systems  Constitutional: Negative.   HENT: Negative.   Eyes: Negative.   Respiratory: Negative.  Negative for shortness of breath.   Cardiovascular: Negative.  Negative for palpitations.  Gastrointestinal: Negative.   Endocrine: Negative.   Genitourinary: Negative.   Musculoskeletal: Negative.   Neurological: Negative.  Negative for headaches.  Hematological: Negative.   Psychiatric/Behavioral: Negative.   All other systems reviewed and are negative.      Objective:   Physical Exam  Constitutional: She is oriented to person, place, and time. She appears well-developed and well-nourished. No distress.  HENT:  Head: Normocephalic and atraumatic.  Eyes: Pupils are equal, round, and reactive to light.  Neck: Normal range of motion. Neck supple. No thyromegaly present.  Cardiovascular: Normal rate, regular rhythm, normal heart sounds and intact distal pulses.   No murmur heard. Pulmonary/Chest: Effort normal and breath sounds normal. No respiratory distress. She has no wheezes.  Abdominal: Soft. Bowel sounds are normal. She exhibits no distension. There is no tenderness.    Musculoskeletal: Normal range of motion. She exhibits no edema or tenderness.  Neurological: She is alert and oriented to person, place, and time. She has normal reflexes. No cranial nerve deficit.  Skin: Skin is warm and dry.  Psychiatric: She has a normal mood and affect. Her behavior is normal. Judgment and thought content normal.  Vitals reviewed.   BP 166/103 mmHg  Pulse 96  Temp(Src) 97.2 F (36.2 C) (Oral)  Ht _0  (1.702 m)  Wt 189 lb 6.4 oz (85.911 kg)  BMI 29.66 kg/m2       Assessment & Plan:  1. HYPERTENSION, BENIGN -Pt started on Norvasc 10 mg today -Dash diet information given -Exercise encouraged - Stress Management  -Continue current meds -RTO in 2 weeks - amLODipine (NORVASC) 10 MG tablet; Take 1 tablet (10 mg total) by mouth daily.  Dispense: 90 tablet; Refill: 3 - BMP8+EGFR  Evelina Dun, FNP

## 2014-08-21 LAB — BMP8+EGFR
BUN/Creatinine Ratio: 23 (ref 9–23)
BUN: 15 mg/dL (ref 6–24)
CO2: 23 mmol/L (ref 18–29)
Calcium: 9.7 mg/dL (ref 8.7–10.2)
Chloride: 100 mmol/L (ref 97–108)
Creatinine, Ser: 0.66 mg/dL (ref 0.57–1.00)
GFR calc Af Amer: 123 mL/min/{1.73_m2} (ref >59)
GFR calc non Af Amer: 107 mL/min/{1.73_m2} (ref >59)
Glucose: 88 mg/dL (ref 65–99)
Potassium: 3.5 mmol/L (ref 3.5–5.2)
Sodium: 142 mmol/L (ref 134–144)

## 2014-09-03 ENCOUNTER — Encounter: Payer: Self-pay | Admitting: Family

## 2014-09-03 ENCOUNTER — Ambulatory Visit (INDEPENDENT_AMBULATORY_CARE_PROVIDER_SITE_OTHER): Payer: 59 | Admitting: Family

## 2014-09-03 VITALS — BP 133/88 | HR 89 | Temp 97.6°F | Ht 67.0 in | Wt 189.4 lb

## 2014-09-03 DIAGNOSIS — I10 Essential (primary) hypertension: Secondary | ICD-10-CM | POA: Diagnosis not present

## 2014-09-03 NOTE — Progress Notes (Signed)
   Subjective:    Patient ID: Brittany Durham, female    DOB: Dec 12, 1970, 44 y.o.   MRN: [id number]  Pt presents to the office today to recheck HTN. PT's BP is at goal today.  Hypertension This is a chronic problem. The current episode started more than 1 year ago. The problem has been resolved since onset. The problem is controlled. Pertinent negatives include no anxiety, headaches, palpitations, peripheral edema or shortness of breath. Risk factors for coronary artery disease include post-menopausal state and sedentary lifestyle. Past treatments include ACE inhibitors, calcium channel blockers and diuretics. The current treatment provides significant improvement. There is no history of kidney disease, CAD/MI, CVA or heart failure.      Review of Systems  Constitutional: Negative.   HENT: Negative.   Eyes: Negative.   Respiratory: Negative.  Negative for shortness of breath.   Cardiovascular: Negative.  Negative for palpitations.  Gastrointestinal: Negative.   Endocrine: Negative.   Genitourinary: Negative.   Musculoskeletal: Negative.   Neurological: Negative.  Negative for headaches.  Hematological: Negative.   Psychiatric/Behavioral: Negative.   All other systems reviewed and are negative.      Objective:   Physical Exam  Constitutional: She is oriented to person, place, and time. She appears well-developed and well-nourished. No distress.  HENT:  Head: Normocephalic and atraumatic.  Right Ear: External ear normal.  Left Ear: External ear normal.  Nose: Nose normal.  Mouth/Throat: Oropharynx is clear and moist.  Eyes: Pupils are equal, round, and reactive to light.  Neck: Normal range of motion. Neck supple. No thyromegaly present.  Cardiovascular: Normal rate, regular rhythm, normal heart sounds and intact distal pulses.   No murmur heard. Pulmonary/Chest: Effort normal and breath sounds normal. No respiratory distress. She has no wheezes.  Abdominal: Soft. Bowel sounds are  normal. She exhibits no distension. There is no tenderness.  Musculoskeletal: Normal range of motion. She exhibits no edema or tenderness.  Neurological: She is alert and oriented to person, place, and time. She has normal reflexes. No cranial nerve deficit.  Skin: Skin is warm and dry.  Psychiatric: She has a normal mood and affect. Her behavior is normal. Judgment and thought content normal.  Vitals reviewed.     BP 133/88 mmHg  Pulse 89  Temp(Src) 97.6 F (36.4 C) (Oral)  Ht $R'5\' 7"'YQ$  (1.702 m)  Wt 189 lb 6.4 oz (85.911 kg)  BMI 29.66 kg/m2     Assessment & Plan:  1. HYPERTENSION, BENIGN -Dash diet information given -Exercise encouraged - Stress Management  -Continue current meds -RTO in 1 year - BMP8+EGFR  Evelina Dun, FNP

## 2014-09-03 NOTE — Patient Instructions (Signed)
DASH Eating Plan DASH stands for "Dietary Approaches to Stop Hypertension." The DASH eating plan is a healthy eating plan that has been shown to reduce high blood pressure (hypertension). Additional health benefits may include reducing the risk of type 2 diabetes mellitus, heart disease, and stroke. The DASH eating plan may also help with weight loss. WHAT DO I NEED TO KNOW ABOUT THE DASH EATING PLAN? For the DASH eating plan, you will follow these general guidelines:  Choose foods with a percent daily value for sodium of less than 5% (as listed on the food label).  Use salt-free seasonings or herbs instead of table salt or sea salt.  Check with your health care provider or pharmacist before using salt substitutes.  Eat lower-sodium products, often labeled as "lower sodium" or "no salt added."  Eat fresh foods.  Eat more vegetables, fruits, and low-fat dairy products.  Choose whole grains. Look for the word "whole" as the first word in the ingredient list.  Choose fish and skinless chicken or turkey more often than red meat. Limit fish, poultry, and meat to 6 oz (170 g) each day.  Limit sweets, desserts, sugars, and sugary drinks.  Choose heart-healthy fats.  Limit cheese to 1 oz (28 g) per day.  Eat more home-cooked food and less restaurant, buffet, and fast food.  Limit fried foods.  Cook foods using methods other than frying.  Limit canned vegetables. If you do use them, rinse them well to decrease the sodium.  When eating at a restaurant, ask that your food be prepared with less salt, or no salt if possible. WHAT FOODS CAN I EAT? Seek help from a dietitian for individual calorie needs. Grains Whole grain or whole wheat bread. Brown rice. Whole grain or whole wheat pasta. Quinoa, bulgur, and whole grain cereals. Low-sodium cereals. Corn or whole wheat flour tortillas. Whole grain cornbread. Whole grain crackers. Low-sodium crackers. Vegetables Fresh or frozen vegetables  (raw, steamed, roasted, or grilled). Low-sodium or reduced-sodium tomato and vegetable juices. Low-sodium or reduced-sodium tomato sauce and paste. Low-sodium or reduced-sodium canned vegetables.  Fruits All fresh, canned (in natural juice), or frozen fruits. Meat and Other Protein Products Ground beef (85% or leaner), grass-fed beef, or beef trimmed of fat. Skinless chicken or turkey. Ground chicken or turkey. Pork trimmed of fat. All fish and seafood. Eggs. Dried beans, peas, or lentils. Unsalted nuts and seeds. Unsalted canned beans. Dairy Low-fat dairy products, such as skim or 1% milk, 2% or reduced-fat cheeses, low-fat ricotta or cottage cheese, or plain low-fat yogurt. Low-sodium or reduced-sodium cheeses. Fats and Oils Tub margarines without trans fats. Light or reduced-fat mayonnaise and salad dressings (reduced sodium). Avocado. Safflower, olive, or canola oils. Natural peanut or almond butter. Other Unsalted popcorn and pretzels. The items listed above may not be a complete list of recommended foods or beverages. Contact your dietitian for more options. WHAT FOODS ARE NOT RECOMMENDED? Grains White bread. White pasta. White rice. Refined cornbread. Bagels and croissants. Crackers that contain trans fat. Vegetables Creamed or fried vegetables. Vegetables in a cheese sauce. Regular canned vegetables. Regular canned tomato sauce and paste. Regular tomato and vegetable juices. Fruits Dried fruits. Canned fruit in light or heavy syrup. Fruit juice. Meat and Other Protein Products Fatty cuts of meat. Ribs, chicken wings, bacon, sausage, bologna, salami, chitterlings, fatback, hot dogs, bratwurst, and packaged luncheon meats. Salted nuts and seeds. Canned beans with salt. Dairy Whole or 2% milk, cream, half-and-half, and cream cheese. Whole-fat or sweetened yogurt. Full-fat   cheeses or blue cheese. Nondairy creamers and whipped toppings. Processed cheese, cheese spreads, or cheese  curds. Condiments Onion and garlic salt, seasoned salt, table salt, and sea salt. Canned and packaged gravies. Worcestershire sauce. Tartar sauce. Barbecue sauce. Teriyaki sauce. Soy sauce, including reduced sodium. Steak sauce. Fish sauce. Oyster sauce. Cocktail sauce. Horseradish. Ketchup and mustard. Meat flavorings and tenderizers. Bouillon cubes. Hot sauce. Tabasco sauce. Marinades. Taco seasonings. Relishes. Fats and Oils Butter, stick margarine, lard, shortening, ghee, and bacon fat. Coconut, palm kernel, or palm oils. Regular salad dressings. Other Pickles and olives. Salted popcorn and pretzels. The items listed above may not be a complete list of foods and beverages to avoid. Contact your dietitian for more information. WHERE CAN I FIND MORE INFORMATION? National Heart, Lung, and Blood Institute: www.nhlbi.nih.gov/health/health-topics/topics/dash/ Document Released: 04/19/2011 Document Revised: 09/14/2013 Document Reviewed: 03/04/2013 ExitCare Patient Information 2015 [employer]. This information is not intended to replace advice given to you by your health care provider. Make sure you discuss any questions you have with your health care provider. Hypertension Hypertension, commonly called high blood pressure, is when the force of blood pumping through your arteries is too strong. Your arteries are the blood vessels that carry blood from your heart throughout your body. A blood pressure reading consists of a higher number over a lower number, such as 110/72. The higher number (systolic) is the pressure inside your arteries when your heart pumps. The lower number (diastolic) is the pressure inside your arteries when your heart relaxes. Ideally you want your blood pressure below 120/80. Hypertension forces your heart to work harder to pump blood. Your arteries may become narrow or stiff. Having hypertension puts you at risk for heart disease, stroke, and other problems.  RISK  FACTORS Some risk factors for high blood pressure are controllable. Others are not.  Risk factors you cannot control include:   Race. You may be at higher risk if you are African American.  Age. Risk increases with age.  Gender. Men are at higher risk than women before age 45 years. After age 65, women are at higher risk than men. Risk factors you can control include:  Not getting enough exercise or physical activity.  Being overweight.  Getting too much fat, sugar, calories, or salt in your diet.  Drinking too much alcohol. SIGNS AND SYMPTOMS Hypertension does not usually cause signs or symptoms. Extremely high blood pressure (hypertensive crisis) may cause headache, anxiety, shortness of breath, and nosebleed. DIAGNOSIS  To check if you have hypertension, your health care provider will measure your blood pressure while you are seated, with your arm held at the level of your heart. It should be measured at least twice using the same arm. Certain conditions can cause a difference in blood pressure between your right and left arms. A blood pressure reading that is higher than normal on one occasion does not mean that you need treatment. If one blood pressure reading is high, ask your health care provider about having it checked again. TREATMENT  Treating high blood pressure includes making lifestyle changes and possibly taking medicine. Living a healthy lifestyle can help lower high blood pressure. You may need to change some of your habits. Lifestyle changes may include:  Following the DASH diet. This diet is high in fruits, vegetables, and whole grains. It is low in salt, red meat, and added sugars.  Getting at least 2 hours of brisk physical activity every week.  Losing weight if necessary.  Not smoking.  Limiting   alcoholic beverages.  Learning ways to reduce stress. If lifestyle changes are not enough to get your blood pressure under control, your health care provider may  prescribe medicine. You may need to take more than one. Work closely with your health care provider to understand the risks and benefits. HOME CARE INSTRUCTIONS  Have your blood pressure rechecked as directed by your health care provider.   Take medicines only as directed by your health care provider. Follow the directions carefully. Blood pressure medicines must be taken as prescribed. The medicine does not work as well when you skip doses. Skipping doses also puts you at risk for problems.   Do not smoke.   Monitor your blood pressure at home as directed by your health care provider. SEEK MEDICAL CARE IF:   You think you are having a reaction to medicines taken.  You have recurrent headaches or feel dizzy.  You have swelling in your ankles.  You have trouble with your vision. SEEK IMMEDIATE MEDICAL CARE IF:  You develop a severe headache or confusion.  You have unusual weakness, numbness, or feel faint.  You have severe chest or abdominal pain.  You vomit repeatedly.  You have trouble breathing. MAKE SURE YOU:   Understand these instructions.  Will watch your condition.  Will get help right away if you are not doing well or get worse. Document Released: 04/30/2005 Document Revised: 09/14/2013 Document Reviewed: 02/20/2013 ExitCare Patient Information 2015 [employer]. This information is not intended to replace advice given to you by your health care provider. Make sure you discuss any questions you have with your health care provider.  

## 2014-09-03 NOTE — Addendum Note (Signed)
Addended by: Earlene Plater on: 09/03/2014 12:10 PM   Modules accepted: Miquel Dunn

## 2014-09-04 LAB — BMP8+EGFR
BUN/Creatinine Ratio: 18 (ref 9–23)
BUN: 11 mg/dL (ref 6–24)
CO2: 25 mmol/L (ref 18–29)
Calcium: 9.7 mg/dL (ref 8.7–10.2)
Chloride: 100 mmol/L (ref 97–108)
Creatinine, Ser: 0.6 mg/dL (ref 0.57–1.00)
GFR calc Af Amer: 127 mL/min/{1.73_m2} (ref >59)
GFR calc non Af Amer: 110 mL/min/{1.73_m2} (ref >59)
Glucose: 86 mg/dL (ref 65–99)
Potassium: 3.7 mmol/L (ref 3.5–5.2)
Sodium: 139 mmol/L (ref 134–144)

## 2014-09-11 ENCOUNTER — Emergency Department (HOSPITAL_COMMUNITY)
Admission: EM | Admit: 2014-09-11 | Discharge: 2014-09-11 | Disposition: A | Discharge status: Home / Self Care | Payer: 59 | Attending: Emergency Medicine | Admitting: Emergency Medicine

## 2014-09-11 ENCOUNTER — Encounter (HOSPITAL_COMMUNITY): Payer: Self-pay | Admitting: Emergency Medicine

## 2014-09-11 DIAGNOSIS — R000 Tachycardia, unspecified: Secondary | ICD-10-CM | POA: Insufficient documentation

## 2014-09-11 DIAGNOSIS — E876 Hypokalemia: Secondary | ICD-10-CM | POA: Diagnosis not present

## 2014-09-11 DIAGNOSIS — M2550 Pain in unspecified joint: Secondary | ICD-10-CM | POA: Insufficient documentation

## 2014-09-11 DIAGNOSIS — I10 Essential (primary) hypertension: Secondary | ICD-10-CM | POA: Diagnosis not present

## 2014-09-11 DIAGNOSIS — R55 Syncope and collapse: Secondary | ICD-10-CM | POA: Insufficient documentation

## 2014-09-11 DIAGNOSIS — Z79899 Other long term (current) drug therapy: Secondary | ICD-10-CM | POA: Diagnosis not present

## 2014-09-11 LAB — BASIC METABOLIC PANEL
Anion gap: 9 (ref 5–15)
BUN: 12 mg/dL (ref 6–23)
CO2: 25 mmol/L (ref 19–32)
Calcium: 9.2 mg/dL (ref 8.4–10.5)
Chloride: 104 mmol/L (ref 96–112)
Creatinine, Ser: 0.69 mg/dL (ref 0.50–1.10)
GFR calc Af Amer: >90 mL/min (ref >90)
GFR calc non Af Amer: >90 mL/min (ref >90)
Glucose, Bld: 113 mg/dL — ABNORMAL HIGH (ref 70–99)
Potassium: 3.2 mmol/L — ABNORMAL LOW (ref 3.5–5.1)
Sodium: 138 mmol/L (ref 135–145)

## 2014-09-11 LAB — CBC WITH DIFFERENTIAL/PLATELET
Basophils Absolute: 0 10*3/uL (ref 0.0–0.1)
Basophils Relative: 0 % (ref 0–1)
Eosinophils Absolute: 0.1 10*3/uL (ref 0.0–0.7)
Eosinophils Relative: 2 % (ref 0–5)
HCT: 35.4 % — ABNORMAL LOW (ref 36.0–46.0)
Hemoglobin: 11.9 g/dL — ABNORMAL LOW (ref 12.0–15.0)
Lymphocytes Relative: 28 % (ref 12–46)
Lymphs Abs: 1.4 10*3/uL (ref 0.7–4.0)
MCH: 30.5 pg (ref 26.0–34.0)
MCHC: 33.6 g/dL (ref 30.0–36.0)
MCV: 90.8 fL (ref 78.0–100.0)
Monocytes Absolute: 0.5 10*3/uL (ref 0.1–1.0)
Monocytes Relative: 9 % (ref 3–12)
Neutro Abs: 3 10*3/uL (ref 1.7–7.7)
Neutrophils Relative %: 61 % (ref 43–77)
Platelets: 189 10*3/uL (ref 150–400)
RBC: 3.9 MIL/uL (ref 3.87–5.11)
RDW: 12.6 % (ref 11.5–15.5)
WBC: 5 10*3/uL (ref 4.0–10.5)

## 2014-09-11 LAB — TROPONIN I: Troponin I: <0.03 ng/mL (ref <0.031)

## 2014-09-11 MED ORDER — POTASSIUM CHLORIDE CRYS ER 20 MEQ PO TBCR
40.0000 meq | EXTENDED_RELEASE_TABLET | Freq: Once | ORAL | Status: AC
  Administered 2014-09-11: 40 meq via ORAL
  Filled 2014-09-11: qty 2

## 2014-09-11 MED ORDER — LORAZEPAM 1 MG PO TABS
1.0000 mg | ORAL_TABLET | Freq: Once | ORAL | Status: AC
  Administered 2014-09-11: 1 mg via ORAL
  Filled 2014-09-11: qty 1

## 2014-09-11 MED ORDER — SODIUM CHLORIDE 0.9 % IV BOLUS (SEPSIS)
1000.0000 mL | Freq: Once | INTRAVENOUS | Status: AC
  Administered 2014-09-11: 1000 mL via INTRAVENOUS

## 2014-09-11 NOTE — ED Notes (Signed)
Pt made aware to return if symptoms worsen or if any life threatening symptoms occur.   

## 2014-09-11 NOTE — Discharge Instructions (Signed)
Your lab tests, and EKG are all negative for any acute event. Your strongly encouraged to rest is much as possible. Use Ativan for anxiety and stress over the weekend. Please speak with Dr. Lenna Gilford for additional evaluation and management. Your potassium was low. You were treated with potassium in the emergency department. I have included a list of foods high in potassium, please increase your foods high in potassium. Please return to the emergency department if any changes, problems, or concerns. Syncope Syncope means a person passes out (faints). The person usually wakes up in less than 5 minutes. It is important to seek medical care for syncope. HOME CARE  Have someone stay with you until you feel normal.  Do not drive, use machines, or play sports until your doctor says it is okay.  Keep all doctor visits as told.  Lie down when you feel like you might pass out. Take deep breaths. Wait until you feel normal before standing up.  Drink enough fluids to keep your pee (urine) clear or pale yellow.  If you take blood pressure or heart medicine, get up slowly. Take several minutes to sit and then stand. GET HELP RIGHT AWAY IF:   You have a severe headache.  You have pain in the chest, belly (abdomen), or back.  You are bleeding from the mouth or butt (rectum).  You have black or tarry poop (stool).  You have an irregular or very fast heartbeat.  You have pain with breathing.  You keep passing out, or you have shaking (seizures) when you pass out.  You pass out when sitting or lying down.  You feel confused.  You have trouble walking.  You have severe weakness.  You have vision problems. If you fainted, call your local emergency services (911 in [place] Do not drive yourself to the hospital. MAKE SURE YOU:   Understand these instructions.  Will watch your condition.  Will get help right away if you are not doing well or get worse. Document Released: 10/17/2007 Document  Revised: 10/30/2011 Document Reviewed: 06/29/2011 [facility] Patient Information 2015 [place], [place]. This information is not intended to replace advice given to you by your health care provider. Make sure you discuss any questions you have with your health care provider.  Potassium Content of Foods Potassium is a mineral found in many foods and drinks. It helps keep fluids and minerals balanced in your body and affects how steadily your heart beats. Potassium also helps control your blood pressure and keep your muscles and nervous system healthy. Certain health conditions and medicines may change the balance of potassium in your body. When this happens, you can help balance your level of potassium through the foods that you do or do not eat. Your health care provider or dietitian may recommend an amount of potassium that you should have each day. The following lists of foods provide the amount of potassium (in parentheses) per serving in each item. HIGH IN POTASSIUM  The following foods and beverages have 200 mg or more of potassium per serving:  Apricots, 2 raw or 5 dry (200 mg).  Artichoke, 1 medium (345 mg).  Avocado, raw,  each (245 mg).  Banana, 1 medium (425 mg).  Beans, lima, or baked beans, canned,  cup (280 mg).  Beans, white, canned,  cup (595 mg).  Beef roast, 3 oz (320 mg).  Beef, ground, 3 oz (270 mg).  Beets, raw or cooked,  cup (260 mg).  Bran muffin, 2 oz (300 mg).  Broccoli,  cup (230 mg).  Brussels sprouts,  cup (250 mg).  Cantaloupe,  cup (215 mg).  Cereal, 100% bran,  cup (200-400 mg).  Cheeseburger, single, fast food, 1 each (225-400 mg).  Chicken, 3 oz (220 mg).  Clams, canned, 3 oz (535 mg).  Crab, 3 oz (225 mg).  Dates, 5 each (270 mg).  Dried beans and peas,  cup (300-475 mg).  Figs, dried, 2 each (260 mg).  Fish: halibut, tuna, cod, snapper, 3 oz (480 mg).  Fish: salmon, haddock, swordfish, perch, 3 oz (300 mg).  Fish, tuna,  canned 3 oz (200 mg).  [place] fries, fast food, 3 oz (470 mg).  Granola with fruit and nuts,  cup (200 mg).  Grapefruit juice,  cup (200 mg).  Greens, beet,  cup (655 mg).  Honeydew melon,  cup (200 mg).  Kale, raw, 1 cup (300 mg).  Kiwi, 1 medium (240 mg).  Kohlrabi, rutabaga, parsnips,  cup (280 mg).  Lentils,  cup (365 mg).  Mango, 1 each (325 mg).  Milk, chocolate, 1 cup (420 mg).  Milk: nonfat, low-fat, whole, buttermilk, 1 cup (350-380 mg).  Molasses, 1 Tbsp (295 mg).  Mushrooms,  cup (280) mg.  Nectarine, 1 each (275 mg).  Nuts: almonds, peanuts, hazelnuts, [place], cashew, mixed, 1 oz (200 mg).  Nuts, pistachios, 1 oz (295 mg).  Orange, 1 each (240 mg).  Orange juice,  cup (235 mg).  Papaya, medium,  fruit (390 mg).  Peanut butter, chunky, 2 Tbsp (240 mg).  Peanut butter, smooth, 2 Tbsp (210 mg).  Pear, 1 medium (200 mg).  Pomegranate, 1 whole (400 mg).  Pomegranate juice,  cup (215 mg).  Pork, 3 oz (350 mg).  Potato chips, salted, 1 oz (465 mg).  Potato, baked with skin, 1 medium (925 mg).  Potatoes, boiled,  cup (255 mg).  Potatoes, mashed,  cup (330 mg).  Prune juice,  cup (370 mg).  Prunes, 5 each (305 mg).  Pudding, chocolate,  cup (230 mg).  Pumpkin, canned,  cup (250 mg).  Raisins, seedless,  cup (270 mg).  Seeds, sunflower or pumpkin, 1 oz (240 mg).  Soy milk, 1 cup (300 mg).  Spinach,  cup (420 mg).  Spinach, canned,  cup (370 mg).  Sweet potato, baked with skin, 1 medium (450 mg).  Swiss chard,  cup (480 mg).  Tomato or vegetable juice,  cup (275 mg).  Tomato sauce or puree,  cup (400-550 mg).  Tomato, raw, 1 medium (290 mg).  Tomatoes, canned,  cup (200-300 mg).  [place], 3 oz (250 mg).  Wheat germ, 1 oz (250 mg).  Winter squash,  cup (250 mg).  Yogurt, plain or fruited, 6 oz (260-435 mg).  Zucchini,  cup (220 mg). MODERATE IN POTASSIUM The following foods and beverages have  50-200 mg of potassium per serving:  Apple, 1 each (150 mg).  Apple juice,  cup (150 mg).  Applesauce,  cup (90 mg).  Apricot nectar,  cup (140 mg).  Asparagus, small spears,  cup or 6 spears (155 mg).  Bagel, cinnamon raisin, 1 each (130 mg).  Bagel, egg or plain, 4 in., 1 each (70 mg).  Beans, green,  cup (90 mg).  Beans, yellow,  cup (190 mg).  Beer, regular, 12 oz (100 mg).  Beets, canned,  cup (125 mg).  Blackberries,  cup (115 mg).  Blueberries,  cup (60 mg).  Bread, whole wheat, 1 slice (70 mg).  Broccoli, raw,  cup (145 mg).  Cabbage,  cup (150 mg).  Carrots, cooked or raw,  cup (180 mg).  Cauliflower, raw,  cup (150 mg).  Celery, raw,  cup (155 mg).  Cereal, bran flakes, cup (120-150 mg).  Cheese, cottage,  cup (110 mg).  Cherries, 10 each (150 mg).  Chocolate, 1 oz bar (165 mg).  Coffee, brewed 6 oz (90 mg).  Corn,  cup or 1 ear (195 mg).  Cucumbers,  cup (80 mg).  Egg, large, 1 each (60 mg).  Eggplant,  cup (60 mg).  Endive, raw, cup (80 mg).  English muffin, 1 each (65 mg).  Fish, orange roughy, 3 oz (150 mg).  Frankfurter, beef or pork, 1 each (75 mg).  Fruit cocktail,  cup (115 mg).  Grape juice,  cup (170 mg).  Grapefruit,  fruit (175 mg).  Grapes,  cup (155 mg).  Greens: kale, turnip, collard,  cup (110-150 mg).  Ice cream or frozen yogurt, chocolate,  cup (175 mg).  Ice cream or frozen yogurt, vanilla,  cup (120-150 mg).  Lemons, limes, 1 each (80 mg).  Lettuce, all types, 1 cup (100 mg).  Mixed vegetables,  cup (150 mg).  Mushrooms, raw,  cup (110 mg).  Nuts: walnuts, pecans, or macadamia, 1 oz (125 mg).  Oatmeal,  cup (80 mg).  Okra,  cup (110 mg).  Onions, raw,  cup (120 mg).  Peach, 1 each (185 mg).  Peaches, canned,  cup (120 mg).  Pears, canned,  cup (120 mg).  Peas, green, frozen,  cup (90 mg).  Peppers, green,  cup (130 mg).  Peppers, red,  cup (160  mg).  Pineapple juice,  cup (165 mg).  Pineapple, fresh or canned,  cup (100 mg).  Plums, 1 each (105 mg).  Pudding, vanilla,  cup (150 mg).  Raspberries,  cup (90 mg).  Rhubarb,  cup (115 mg).  Rice, wild,  cup (80 mg).  Shrimp, 3 oz (155 mg).  Spinach, raw, 1 cup (170 mg).  Strawberries,  cup (125 mg).  Summer squash  cup (175-200 mg).  Swiss chard, raw, 1 cup (135 mg).  Tangerines, 1 each (140 mg).  Tea, brewed, 6 oz (65 mg).  Turnips,  cup (140 mg).  Watermelon,  cup (85 mg).  Wine, red, table, 5 oz (180 mg).  Wine, white, table, 5 oz (100 mg). LOW IN POTASSIUM The following foods and beverages have less than 50 mg of potassium per serving.  Bread, white, 1 slice (30 mg).  Carbonated beverages, 12 oz (less than 5 mg).  Cheese, 1 oz (20-30 mg).  Cranberries,  cup (45 mg).  Cranberry juice cocktail,  cup (20 mg).  Fats and oils, 1 Tbsp (less than 5 mg).  Hummus, 1 Tbsp (32 mg).  Nectar: papaya, mango, or pear,  cup (35 mg).  Rice, white or brown,  cup (50 mg).  Spaghetti or macaroni,  cup cooked (30 mg).  Tortilla, flour or corn, 1 each (50 mg).  Waffle, 4 in., 1 each (50 mg).  Water chestnuts,  cup (40 mg). Document Released: 12/12/2004 Document Revised: 05/05/2013 Document Reviewed: 03/27/2013 [facility] Patient Information 2015 [place], [place]. This information is not intended to replace advice given to you by your health care provider. Make sure you discuss any questions you have with your health care provider.

## 2014-09-11 NOTE — ED Provider Notes (Signed)
CSN: [id number]     Arrival date & time 09/11/14  1000 History   First MD Initiated Contact with Patient 09/11/14 1017     Chief Complaint  Patient presents with  . Loss of Consciousness     (Consider location/radiation/quality/duration/timing/severity/associated sxs/prior Treatment) Patient is a 44 y.o. female presenting with syncope. The history is provided by the patient.  Loss of Consciousness Episode history:  Single Most recent episode:  Today Duration:  1 minute Progression:  Partially resolved Chronicity:  New Context: sitting down   Context comment:  Receiving difficulty news about her father in ICU Witnessed: yes   Worsened by:  Nothing tried Ineffective treatments:  None tried Associated symptoms: anxiety   Associated symptoms: no focal weakness, no seizures, no visual change, no vomiting and no weakness   Risk factors: no congenital heart disease, no coronary artery disease and no seizures     Past Medical History  Diagnosis Date  . Hypertension    Past Surgical History  Procedure Laterality Date  . Tubal ligation    . Fracture surgery     Family History  Problem Relation Age of Onset  . Hypertension Mother   . Hypertension Brother    History  Substance Use Topics  . Smoking status: Never Smoker   . Smokeless tobacco: Never Used  . Alcohol Use: No   OB History    Gravida Para Term Preterm AB TAB SAB Ectopic Multiple Living   1 1 1             Review of Systems  Cardiovascular: Positive for syncope.  Gastrointestinal: Negative for vomiting.  Musculoskeletal: Positive for arthralgias.  Neurological: Negative for focal weakness, seizures and weakness.  All other systems reviewed and are negative.     Allergies  Review of patient's allergies indicates no known allergies.  Home Medications   Prior to Admission medications   Medication Sig Start Date End Date Taking? Authorizing Provider  amLODipine (NORVASC) 10 MG tablet Take 1 tablet (10 mg  total) by mouth daily. 08/20/14   Sharion Balloon, FNP  cetirizine (ZYRTEC) 10 MG tablet Take 1 tablet (10 mg total) by mouth daily. 11/29/12   Hope Bunnie Pion, NP  lisinopril-hydrochlorothiazide (ZESTORETIC) 20-12.5 MG per tablet Take 2 tablets by mouth daily. 07/19/14   Sharion Balloon, FNP  omeprazole (PRILOSEC) 20 MG capsule Take 1 capsule (20 mg total) by mouth daily. 06/29/14   Sharion Balloon, FNP  Vitamin D, Ergocalciferol, (DRISDOL) 50000 UNITS CAPS capsule Take 1 capsule (50,000 Units total) by mouth every 7 (seven) days. 06/30/14   Christy A Hawks, FNP   BP 164/107 mmHg  Pulse 121  Temp(Src) 97.9 F (36.6 C) (Oral)  Resp 18  Ht 5\' 7"  (1.702 m)  Wt 185 lb (83.915 kg)  BMI 28.97 kg/m2  SpO2 100%  LMP 09/04/2014 Physical Exam  Constitutional: She is oriented to person, place, and time. She appears well-developed and well-nourished.  Non-toxic appearance.  HENT:  Head: Normocephalic.  Right Ear: Tympanic membrane and external ear normal.  Left Ear: Tympanic membrane and external ear normal.  Eyes: EOM and lids are normal. Pupils are equal, round, and reactive to light.  Neck: Normal range of motion. Neck supple. Carotid bruit is not present.  Cardiovascular: Regular rhythm, normal heart sounds, intact distal pulses and normal pulses.  Tachycardia present.  Exam reveals no gallop.   No murmur heard. Pulmonary/Chest: Breath sounds normal. No respiratory distress. She has no wheezes. She has no  rales.  Abdominal: Soft. Bowel sounds are normal. There is no tenderness. There is no guarding.  Musculoskeletal: Normal range of motion.  Pain to palpation or movement of the left knee. No edema of lower extremities.  Lymphadenopathy:       Head (right side): No submandibular adenopathy present.       Head (left side): No submandibular adenopathy present.    She has no cervical adenopathy.  Neurological: She is oriented to person, place, and time. She has normal strength. No cranial nerve deficit  or sensory deficit. She exhibits normal muscle tone. Coordination normal.  Speech clear. No gross neuro deficits. Grip symmetrical   Skin: Skin is warm and dry. She is not diaphoretic.  Psychiatric: She has a normal mood and affect. Her speech is normal.  Nursing note and vitals reviewed.   ED Course  Procedures (including critical care time) Labs Review Labs Reviewed  BASIC METABOLIC PANEL  CBC WITH DIFFERENTIAL/PLATELET  TROPONIN I    Imaging Review No results found.   EKG Interpretation None      MDM  Vital signs stable. Pt was visiting a family member in ICU, received some difficulty news concerning a loved one and "passed out" for nearlyl 1 min per family. No reported loss of control of bowel or bladder.EKG non-acute. Troponin neg. E'lytes Non-acute. K+ 3.2. Oral potassium given to the patient.  Suspect emotional support Ativan 1mg  TID given to the patient. Pt will follow up with PCP for assistance. Pt to return to the ED if any changes or problem.   Final diagnoses:  None    *I have reviewed nursing notes, vital signs, and all appropriate lab and imaging results for this patient.Lily Kocher, PA-C 09/12/14 [place], MD 09/14/14 [phone]

## 2014-09-11 NOTE — ED Notes (Signed)
Patient visiting family member in ICU. Per nurse patient received some difficult news about family member . Patient started staring off per daughter and would not respond or blink. Per nurse patient suddenly slumped over in chair and was unarousable x1 minute, when patient was aroused patient slow to respond. Patient denies any headache. No slurred speech noted. Patient has hx of seizures but seizure like activity was denied by nurse and daughter.

## 2014-12-06 ENCOUNTER — Encounter (HOSPITAL_COMMUNITY)
Admission: RE | Admit: 2014-12-06 | Discharge: 2014-12-06 | Disposition: A | Discharge status: Home / Self Care | Payer: 59 | Source: Ambulatory Visit | Attending: Orthopaedic Surgery | Admitting: Orthopaedic Surgery

## 2014-12-06 ENCOUNTER — Encounter (HOSPITAL_COMMUNITY): Payer: Self-pay

## 2014-12-06 DIAGNOSIS — Y939 Activity, unspecified: Secondary | ICD-10-CM | POA: Diagnosis not present

## 2014-12-06 DIAGNOSIS — Z79899 Other long term (current) drug therapy: Secondary | ICD-10-CM | POA: Diagnosis not present

## 2014-12-06 DIAGNOSIS — Y999 Unspecified external cause status: Secondary | ICD-10-CM | POA: Diagnosis not present

## 2014-12-06 DIAGNOSIS — K219 Gastro-esophageal reflux disease without esophagitis: Secondary | ICD-10-CM | POA: Diagnosis not present

## 2014-12-06 DIAGNOSIS — I10 Essential (primary) hypertension: Secondary | ICD-10-CM | POA: Diagnosis not present

## 2014-12-06 DIAGNOSIS — Y929 Unspecified place or not applicable: Secondary | ICD-10-CM | POA: Diagnosis not present

## 2014-12-06 DIAGNOSIS — X58XXXA Exposure to other specified factors, initial encounter: Secondary | ICD-10-CM | POA: Diagnosis not present

## 2014-12-06 DIAGNOSIS — S83242A Other tear of medial meniscus, current injury, left knee, initial encounter: Secondary | ICD-10-CM | POA: Diagnosis not present

## 2014-12-06 DIAGNOSIS — M25562 Pain in left knee: Secondary | ICD-10-CM | POA: Diagnosis present

## 2014-12-06 DIAGNOSIS — S83282A Other tear of lateral meniscus, current injury, left knee, initial encounter: Secondary | ICD-10-CM | POA: Diagnosis not present

## 2014-12-06 LAB — COMPREHENSIVE METABOLIC PANEL
ALT: 22 U/L (ref 14–54)
AST: 19 U/L (ref 15–41)
Albumin: 4.1 g/dL (ref 3.5–5.0)
Alkaline Phosphatase: 53 U/L (ref 38–126)
Anion gap: 6 (ref 5–15)
BUN: 14 mg/dL (ref 6–20)
CO2: 28 mmol/L (ref 22–32)
Calcium: 9.4 mg/dL (ref 8.9–10.3)
Chloride: 103 mmol/L (ref 101–111)
Creatinine, Ser: 0.68 mg/dL (ref 0.44–1.00)
GFR calc Af Amer: >60 mL/min (ref >60)
GFR calc non Af Amer: >60 mL/min (ref >60)
Glucose, Bld: 97 mg/dL (ref 65–99)
Potassium: 3.4 mmol/L — ABNORMAL LOW (ref 3.5–5.1)
Sodium: 137 mmol/L (ref 135–145)
Total Bilirubin: 0.7 mg/dL (ref 0.3–1.2)
Total Protein: 7.4 g/dL (ref 6.5–8.1)

## 2014-12-06 LAB — CBC WITH DIFFERENTIAL/PLATELET
Basophils Absolute: 0 10*3/uL (ref 0.0–0.1)
Basophils Relative: 0 % (ref 0–1)
Eosinophils Absolute: 0.1 10*3/uL (ref 0.0–0.7)
Eosinophils Relative: 2 % (ref 0–5)
HCT: 34.4 % — ABNORMAL LOW (ref 36.0–46.0)
Hemoglobin: 11.7 g/dL — ABNORMAL LOW (ref 12.0–15.0)
Lymphocytes Relative: 34 % (ref 12–46)
Lymphs Abs: 1.6 10*3/uL (ref 0.7–4.0)
MCH: 30.7 pg (ref 26.0–34.0)
MCHC: 34 g/dL (ref 30.0–36.0)
MCV: 90.3 fL (ref 78.0–100.0)
Monocytes Absolute: 0.4 10*3/uL (ref 0.1–1.0)
Monocytes Relative: 8 % (ref 3–12)
Neutro Abs: 2.6 10*3/uL (ref 1.7–7.7)
Neutrophils Relative %: 56 % (ref 43–77)
Platelets: 196 10*3/uL (ref 150–400)
RBC: 3.81 MIL/uL — ABNORMAL LOW (ref 3.87–5.11)
RDW: 12.6 % (ref 11.5–15.5)
WBC: 4.6 10*3/uL (ref 4.0–10.5)

## 2014-12-06 LAB — URINALYSIS, ROUTINE W REFLEX MICROSCOPIC
Bilirubin Urine: NEGATIVE
Glucose, UA: NEGATIVE mg/dL
Ketones, ur: NEGATIVE mg/dL
Leukocytes, UA: NEGATIVE
Nitrite: NEGATIVE
Protein, ur: NEGATIVE mg/dL
Specific Gravity, Urine: 1.01 (ref 1.005–1.030)
Urobilinogen, UA: 0.2 mg/dL (ref 0.0–1.0)
pH: 7 (ref 5.0–8.0)

## 2014-12-06 LAB — URINE MICROSCOPIC-ADD ON

## 2014-12-06 NOTE — Patient Instructions (Signed)
[facility]  12/06/2014     @PREFPERIOPPHARMACY @   Your procedure is scheduled on 12/09/2014  Report to [facility] at  615  A.M.  Call this number if you have problems the morning of surgery:  [phone]   Remember:  Do not eat food or drink liquids after midnight.  Take these medicines the morning of surgery with A SIP OF WATER  Norvasc, lisinopril.   Do not wear jewelry, make-up or nail polish.  Do not wear lotions, powders, or perfumes.    Do not shave 48 hours prior to surgery.  Men may shave face and neck.  Do not bring valuables to the hospital.  [facility] is not responsible for any belongings or valuables.  Contacts, dentures or bridgework may not be worn into surgery.  Leave your suitcase in the car.  After surgery it may be brought to your room.  For patients admitted to the hospital, discharge time will be determined by your treatment team.  Patients discharged the day of surgery will not be allowed to drive home.   Name and phone number of your driver:   family Special instructions:  none  Please read over the following fact sheets that you were given. Pain Booklet, Coughing and Deep Breathing, Surgical Site Infection Prevention, Anesthesia Post-op Instructions and Care and Recovery After Surgery      Arthroscopic Procedure, Knee An arthroscopic procedure can find what is wrong with your knee. PROCEDURE Arthroscopy is a surgical technique that allows your orthopedic surgeon to diagnose and treat your knee injury with accuracy. They will look into your knee through a small instrument. This is almost like a small (pencil sized) telescope. Because arthroscopy affects your knee less than open knee surgery, you can anticipate a more rapid recovery. Taking an active role by following your caregiver's instructions will help with rapid and complete recovery. Use crutches, rest, elevation, ice, and knee exercises as instructed. The length of recovery depends  on various factors including type of injury, age, physical condition, medical conditions, and your rehabilitation. Your knee is the joint between the large bones (femur and tibia) in your leg. Cartilage covers these bone ends which are smooth and slippery and allow your knee to bend and move smoothly. Two menisci, thick, semi-lunar shaped pads of cartilage which form a rim inside the joint, help absorb shock and stabilize your knee. Ligaments bind the bones together and support your knee joint. Muscles move the joint, help support your knee, and take stress off the joint itself. Because of this all programs and physical therapy to rehabilitate an injured or repaired knee require rebuilding and strengthening your muscles. AFTER THE PROCEDURE  After the procedure, you will be moved to a recovery area until most of the effects of the medication have worn off. Your caregiver will discuss the test results with you.  Only take over-the-counter or prescription medicines for pain, discomfort, or fever as directed by your caregiver. SEEK MEDICAL CARE IF:   You have increased bleeding from your wounds.  You see redness, swelling, or have increasing pain in your wounds.  You have pus coming from your wound.  You have an oral temperature above 102 F (38.9 C).  You notice a bad smell coming from the wound or dressing.  You have severe pain with any motion of your knee. SEEK IMMEDIATE MEDICAL CARE IF:   You develop a rash.  You have difficulty breathing.  You have any  allergic problems. Document Released: 04/27/2000 Document Revised: 07/23/2011 Document Reviewed: 11/19/2007 [facility] Patient Information 2015 [place], [place]. This information is not intended to replace advice given to you by your health care provider. Make sure you discuss any questions you have with your health care provider. PATIENT INSTRUCTIONS POST-ANESTHESIA  IMMEDIATELY FOLLOWING SURGERY:  Do not drive or operate machinery  for the first twenty four hours after surgery.  Do not make any important decisions for twenty four hours after surgery or while taking narcotic pain medications or sedatives.  If you develop intractable nausea and vomiting or a severe headache please notify your doctor immediately.  FOLLOW-UP:  Please make an appointment with your surgeon as instructed. You do not need to follow up with anesthesia unless specifically instructed to do so.  WOUND CARE INSTRUCTIONS (if applicable):  Keep a dry clean dressing on the anesthesia/puncture wound site if there is drainage.  Once the wound has quit draining you may leave it open to air.  Generally you should leave the bandage intact for twenty four hours unless there is drainage.  If the epidural site drains for more than 36-48 hours please call the anesthesia department.  QUESTIONS?:  Please feel free to call your physician or the hospital operator if you have any questions, and they will be happy to assist you.

## 2014-12-09 ENCOUNTER — Ambulatory Visit (HOSPITAL_COMMUNITY): Payer: 59 | Admitting: Anesthesiology

## 2014-12-09 ENCOUNTER — Encounter (HOSPITAL_COMMUNITY): Payer: Self-pay | Admitting: *Deleted

## 2014-12-09 ENCOUNTER — Ambulatory Visit (HOSPITAL_COMMUNITY)
Admission: RE | Admit: 2014-12-09 | Discharge: 2014-12-09 | Disposition: A | Discharge status: Home / Self Care | Payer: 59 | Source: Ambulatory Visit | Attending: Orthopaedic Surgery | Admitting: Orthopaedic Surgery

## 2014-12-09 ENCOUNTER — Encounter (HOSPITAL_COMMUNITY)
Admission: RE | Disposition: A | Discharge status: Home / Self Care | Payer: Self-pay | Source: Ambulatory Visit | Attending: Orthopaedic Surgery

## 2014-12-09 DIAGNOSIS — X58XXXA Exposure to other specified factors, initial encounter: Secondary | ICD-10-CM | POA: Insufficient documentation

## 2014-12-09 DIAGNOSIS — K219 Gastro-esophageal reflux disease without esophagitis: Secondary | ICD-10-CM | POA: Insufficient documentation

## 2014-12-09 DIAGNOSIS — I10 Essential (primary) hypertension: Secondary | ICD-10-CM | POA: Insufficient documentation

## 2014-12-09 DIAGNOSIS — Y929 Unspecified place or not applicable: Secondary | ICD-10-CM | POA: Insufficient documentation

## 2014-12-09 DIAGNOSIS — Y939 Activity, unspecified: Secondary | ICD-10-CM | POA: Insufficient documentation

## 2014-12-09 DIAGNOSIS — Y999 Unspecified external cause status: Secondary | ICD-10-CM | POA: Insufficient documentation

## 2014-12-09 DIAGNOSIS — S83242A Other tear of medial meniscus, current injury, left knee, initial encounter: Secondary | ICD-10-CM | POA: Diagnosis not present

## 2014-12-09 DIAGNOSIS — S83282A Other tear of lateral meniscus, current injury, left knee, initial encounter: Secondary | ICD-10-CM | POA: Insufficient documentation

## 2014-12-09 DIAGNOSIS — Z79899 Other long term (current) drug therapy: Secondary | ICD-10-CM | POA: Insufficient documentation

## 2014-12-09 HISTORY — PX: KNEE ARTHROSCOPY WITH MEDIAL MENISECTOMY: SHX5651

## 2014-12-09 SURGERY — ARTHROSCOPY, KNEE, WITH MEDIAL MENISCECTOMY
Anesthesia: General | Site: Knee | Laterality: Left

## 2014-12-09 MED ORDER — MIDAZOLAM HCL 2 MG/2ML IJ SOLN
INTRAMUSCULAR | Status: AC
  Filled 2014-12-09: qty 2

## 2014-12-09 MED ORDER — BUPIVACAINE HCL (PF) 0.25 % IJ SOLN
INTRAMUSCULAR | Status: AC
  Filled 2014-12-09: qty 30

## 2014-12-09 MED ORDER — PROPOFOL 10 MG/ML IV BOLUS
INTRAVENOUS | Status: DC | PRN
  Administered 2014-12-09: 50 mg via INTRAVENOUS
  Administered 2014-12-09: 100 mg via INTRAVENOUS

## 2014-12-09 MED ORDER — MIDAZOLAM HCL 2 MG/2ML IJ SOLN
INTRAMUSCULAR | Status: DC | PRN
  Administered 2014-12-09: 2 mg via INTRAVENOUS

## 2014-12-09 MED ORDER — ONDANSETRON HCL 4 MG/2ML IJ SOLN
4.0000 mg | Freq: Once | INTRAMUSCULAR | Status: AC
  Administered 2014-12-09: 4 mg via INTRAVENOUS

## 2014-12-09 MED ORDER — PROPOFOL 10 MG/ML IV BOLUS
INTRAVENOUS | Status: AC
  Filled 2014-12-09: qty 20

## 2014-12-09 MED ORDER — ONDANSETRON HCL 4 MG/2ML IJ SOLN
4.0000 mg | Freq: Once | INTRAMUSCULAR | Status: AC | PRN
  Administered 2014-12-09: 4 mg via INTRAVENOUS
  Filled 2014-12-09: qty 2

## 2014-12-09 MED ORDER — HYDROCODONE-ACETAMINOPHEN 7.5-325 MG PO TABS: 1.0000 | ORAL_TABLET | ORAL | Status: DC | PRN

## 2014-12-09 MED ORDER — FENTANYL CITRATE (PF) 250 MCG/5ML IJ SOLN
INTRAMUSCULAR | Status: DC | PRN
  Administered 2014-12-09: 25 ug via INTRAVENOUS
  Administered 2014-12-09: 50 ug via INTRAVENOUS

## 2014-12-09 MED ORDER — STERILE WATER FOR IRRIGATION IR SOLN
Status: DC | PRN
  Administered 2014-12-09: 1000 mL

## 2014-12-09 MED ORDER — MIDAZOLAM HCL 2 MG/2ML IJ SOLN
1.0000 mg | INTRAMUSCULAR | Status: DC | PRN
  Administered 2014-12-09: 2 mg via INTRAVENOUS

## 2014-12-09 MED ORDER — FENTANYL CITRATE (PF) 100 MCG/2ML IJ SOLN
INTRAMUSCULAR | Status: AC
Start: 2014-12-09 — End: 2014-12-09
  Filled 2014-12-09: qty 2

## 2014-12-09 MED ORDER — SODIUM CHLORIDE 0.9 % IR SOLN: Status: DC | PRN

## 2014-12-09 MED ORDER — FENTANYL CITRATE (PF) 100 MCG/2ML IJ SOLN
25.0000 ug | INTRAMUSCULAR | Status: AC
  Administered 2014-12-09 (×2): 25 ug via INTRAVENOUS

## 2014-12-09 MED ORDER — FENTANYL CITRATE (PF) 100 MCG/2ML IJ SOLN
25.0000 ug | INTRAMUSCULAR | Status: DC | PRN
  Administered 2014-12-09: 25 ug via INTRAVENOUS
  Filled 2014-12-09: qty 2

## 2014-12-09 MED ORDER — LACTATED RINGERS IR SOLN
Status: DC | PRN
  Administered 2014-12-09 (×2): 3000 mL

## 2014-12-09 MED ORDER — FENTANYL CITRATE (PF) 250 MCG/5ML IJ SOLN
INTRAMUSCULAR | Status: AC
  Filled 2014-12-09: qty 5

## 2014-12-09 MED ORDER — LIDOCAINE HCL (PF) 1 % IJ SOLN
INTRAMUSCULAR | Status: AC
  Filled 2014-12-09: qty 5

## 2014-12-09 MED ORDER — BUPIVACAINE HCL (PF) 0.25 % IJ SOLN
INTRAMUSCULAR | Status: DC | PRN
  Administered 2014-12-09: 30 mL

## 2014-12-09 MED ORDER — LIDOCAINE HCL 1 % IJ SOLN
INTRAMUSCULAR | Status: DC | PRN
  Administered 2014-12-09: 30 mg via INTRADERMAL

## 2014-12-09 MED ORDER — ONDANSETRON HCL 4 MG/2ML IJ SOLN
INTRAMUSCULAR | Status: AC
  Filled 2014-12-09: qty 2

## 2014-12-09 MED ORDER — LACTATED RINGERS IV SOLN
INTRAVENOUS | Status: DC
  Administered 2014-12-09: 1000 mL via INTRAVENOUS

## 2014-12-09 SURGICAL SUPPLY — 58 items
BAG HAMPER (MISCELLANEOUS) ×4 IMPLANT
BANDAGE ELASTIC 6 VELCRO NS (GAUZE/BANDAGES/DRESSINGS) ×4 IMPLANT
BANDAGE ESMARK 4X12 BL STRL LF (DISPOSABLE) ×2 IMPLANT
BLADE SURG 15 STRL LF DISP TIS (BLADE) ×2 IMPLANT
BLADE SURG 15 STRL SS (BLADE) ×2
BLADE SURG SZ11 CARB STEEL (BLADE) ×4 IMPLANT
BNDG ESMARK 4X12 BLUE STRL LF (DISPOSABLE) ×4
CLOTH BEACON ORANGE TIMEOUT ST (SAFETY) ×4 IMPLANT
CUFF TOURNIQUET SINGLE 34IN LL (TOURNIQUET CUFF) ×4 IMPLANT
CUFF TOURNIQUET SINGLE 44IN (TOURNIQUET CUFF) IMPLANT
CUTTER TOMCAT 4.0M (BURR) ×4 IMPLANT
CUTTER TOMCAT 5.0MM (BURR) ×4 IMPLANT
DECANTER SPIKE VIAL GLASS SM (MISCELLANEOUS) ×4 IMPLANT
DRAPE PROXIMA HALF (DRAPES) ×4 IMPLANT
DRSG XEROFORM 1X8 (GAUZE/BANDAGES/DRESSINGS) ×4 IMPLANT
DURAPREP 26ML APPLICATOR (WOUND CARE) ×8 IMPLANT
ELECT MENISCUS 165MM 90D (ELECTRODE) IMPLANT
FORMALIN 10 PREFIL 480ML (MISCELLANEOUS) ×4 IMPLANT
GAUZE SPONGE 4X4 12PLY STRL (GAUZE/BANDAGES/DRESSINGS) ×4 IMPLANT
GAUZE SPONGE 4X4 16PLY XRAY LF (GAUZE/BANDAGES/DRESSINGS) ×4 IMPLANT
GLOVE BIO SURGEON STRL SZ8 (GLOVE) ×4 IMPLANT
GLOVE BIO SURGEON STRL SZ8.5 (GLOVE) ×4 IMPLANT
GLOVE BIOGEL PI IND STRL 7.0 (GLOVE) ×4 IMPLANT
GLOVE BIOGEL PI INDICATOR 7.0 (GLOVE) ×4
GLOVE ECLIPSE 6.5 STRL STRAW (GLOVE) ×8 IMPLANT
GLOVE EXAM NITRILE MD LF STRL (GLOVE) ×4 IMPLANT
GOWN STRL REUS W/TWL LRG LVL3 (GOWN DISPOSABLE) ×8 IMPLANT
GOWN STRL REUS W/TWL XL LVL3 (GOWN DISPOSABLE) ×4 IMPLANT
HANDPIECE (MISCELLANEOUS) IMPLANT
HLDR LEG FOAM (MISCELLANEOUS) ×2 IMPLANT
IV LACTATED RINGER IRRG 3000ML (IV SOLUTION) ×6
IV LR IRRIG 3000ML ARTHROMATIC (IV SOLUTION) ×6 IMPLANT
KIT BLADEGUARD II DBL (SET/KITS/TRAYS/PACK) ×4 IMPLANT
KIT ROOM TURNOVER AP CYSTO (KITS) ×4 IMPLANT
KNIFE HOOK (BLADE) IMPLANT
KNIFE MENISECTOMY (BLADE) IMPLANT
LEG HOLDER FOAM (MISCELLANEOUS) ×2
MANIFOLD NEPTUNE II (INSTRUMENTS) ×4 IMPLANT
MARKER SKIN DUAL TIP RULER LAB (MISCELLANEOUS) ×4 IMPLANT
NEEDLE HYPO 21X1.5 SAFETY (NEEDLE) ×4 IMPLANT
NEEDLE SPNL 18GX3.5 QUINCKE PK (NEEDLE) ×8 IMPLANT
NS IRRIG 1000ML POUR BTL (IV SOLUTION) ×4 IMPLANT
PACK ARTHRO LIMB DRAPE STRL (MISCELLANEOUS) ×4 IMPLANT
PAD ABD 5X9 TENDERSORB (GAUZE/BANDAGES/DRESSINGS) ×4 IMPLANT
PAD ARMBOARD 7.5X6 YLW CONV (MISCELLANEOUS) ×4 IMPLANT
PADDING CAST COTTON 6X4 STRL (CAST SUPPLIES) ×4 IMPLANT
PADDING WEBRIL 6 STERILE (GAUZE/BANDAGES/DRESSINGS) ×4 IMPLANT
SET ARTHROSCOPY INST (INSTRUMENTS) ×4 IMPLANT
SET BASIN LINEN APH (SET/KITS/TRAYS/PACK) ×4 IMPLANT
SOLUTION ANTI FOG 6CC (MISCELLANEOUS) ×4 IMPLANT
SPONGE GAUZE 4X4 12PLY (GAUZE/BANDAGES/DRESSINGS) ×3 IMPLANT
SUT ETHILON 3 0 FSL (SUTURE) ×4 IMPLANT
SYR 30ML LL (SYRINGE) ×4 IMPLANT
TIP 0 DEGREE (MISCELLANEOUS) IMPLANT
TIP 30 DEGREE (MISCELLANEOUS) IMPLANT
TIP 70 DEGREE (MISCELLANEOUS) IMPLANT
TUBING FLOSTEADY ARTHRO PUMP (IRRIGATION / IRRIGATOR) ×4 IMPLANT
YANKAUER SUCT BULB TIP 10FT TU (MISCELLANEOUS) ×8 IMPLANT

## 2014-12-09 NOTE — H&P (Signed)
Brittany Durham is an 44 y.o. female.   Chief Complaint: Left knee pain HPI: She has had pain in the left knee for over a year and a half.  It has had swelling, popping, and giving way.  She had a MRI in 2015 which was negative but MRI this past March showed tear of the medial meniscus and a flap tear of the lateral meniscus.  She wanted to delay surgery until now.  She understands arthroscopy is an elective outpatient procedure.  She will need physical therapy post operative.  Her knee is unchanged.  Past Medical History  Diagnosis Date  . Hypertension     Past Surgical History  Procedure Laterality Date  . Tubal ligation    . Wisdom tooth extraction      Family History  Problem Relation Age of Onset  . Hypertension Mother   . Hypertension Brother    Social History:  reports that she has never smoked. She has never used smokeless tobacco. She reports that she does not drink alcohol or use illicit drugs.  Allergies: No Known Allergies  Medications Prior to Admission  Medication Sig Dispense Refill  . amLODipine (NORVASC) 10 MG tablet Take 1 tablet (10 mg total) by mouth daily. 90 tablet 3  . ibuprofen (ADVIL,MOTRIN) 200 MG tablet Take 400 mg by mouth every 6 (six) hours as needed for moderate pain.    Marland Kitchen lisinopril-hydrochlorothiazide (ZESTORETIC) 20-12.5 MG per tablet Take 2 tablets by mouth daily. 180 tablet 4  . Vitamin D, Ergocalciferol, (DRISDOL) 50000 UNITS CAPS capsule Take 1 capsule (50,000 Units total) by mouth every 7 (seven) days. 12 capsule 3    No results found for this or any previous visit (from the past 48 hour(s)). No results found.  Review of Systems  Cardiovascular:       Hypertension  Gastrointestinal:       Jerrye Bushy  Musculoskeletal: Positive for joint pain (Pain left knee with giving way and swelling.).    Blood pressure 156/105, temperature 97.6 F (36.4 C), temperature source Oral, resp. rate 20, height 5\' 7"  (1.702 m), weight 83.915 kg (185 lb), SpO2 98  %. Physical Exam  Constitutional: She is oriented to person, place, and time. She appears well-developed and well-nourished.  HENT:  Head: Normocephalic and atraumatic.  Eyes: Conjunctivae and EOM are normal. Pupils are equal, round, and reactive to light.  Neck: Normal range of motion. Neck supple.  Cardiovascular: Normal rate, regular rhythm, normal heart sounds and intact distal pulses.   Respiratory: Effort normal.  GI: Soft.  Musculoskeletal: She exhibits tenderness (Pain left knee with slight effusion.  Positive medial Mc[place], ROM 0 to 110.  NV intact.).  Neurological: She is alert and oriented to person, place, and time. She has normal reflexes.  Skin: Skin is warm and dry.  Psychiatric: She has a normal mood and affect. Her behavior is normal. Judgment and thought content normal.     Assessment/Plan Tear of the left knee medial meniscus with flap tear of the lateral meniscus.  For arthroscopy of the knee as elective procedure.  Brittany Durham 12/09/2014, 7:51 AM

## 2014-12-09 NOTE — Discharge Instructions (Signed)
Use crutches or walker.  You may bare weight as tolerated on the left knee.  You may remove dressing when you desire.  Use peroxide to cleanse the wounds.  Put Band-aids over the stitches.  Otherwise keep wounds dry.  Use ice as needed.  Keep physical therapy appointment.  See Dr. Luna Glasgow in about ten days.  If any problem, call his office at [phone] or if after hours, the hospital at [phone].  Arthroscopic Procedure, Knee An arthroscopic procedure can find what is wrong with your knee. PROCEDURE Arthroscopy is a surgical technique that allows your orthopedic surgeon to diagnose and treat your knee injury with accuracy. They will look into your knee through a small instrument. This is almost like a small (pencil sized) telescope. Because arthroscopy affects your knee less than open knee surgery, you can anticipate a more rapid recovery. Taking an active role by following your caregiver's instructions will help with rapid and complete recovery. Use crutches, rest, elevation, ice, and knee exercises as instructed. The length of recovery depends on various factors including type of injury, age, physical condition, medical conditions, and your rehabilitation. Your knee is the joint between the large bones (femur and tibia) in your leg. Cartilage covers these bone ends which are smooth and slippery and allow your knee to bend and move smoothly. Two menisci, thick, semi-lunar shaped pads of cartilage which form a rim inside the joint, help absorb shock and stabilize your knee. Ligaments bind the bones together and support your knee joint. Muscles move the joint, help support your knee, and take stress off the joint itself. Because of this all programs and physical therapy to rehabilitate an injured or repaired knee require rebuilding and strengthening your muscles. AFTER THE PROCEDURE  After the procedure, you will be moved to a recovery area until most of the effects of the medication have worn  off. Your caregiver will discuss the test results with you.  Only take over-the-counter or prescription medicines for pain, discomfort, or fever as directed by your caregiver. SEEK MEDICAL CARE IF:   You have increased bleeding from your wounds.  You see redness, swelling, or have increasing pain in your wounds.  You have pus coming from your wound.  You have an oral temperature above 102 F (38.9 C).  You notice a bad smell coming from the wound or dressing.  You have severe pain with any motion of your knee. SEEK IMMEDIATE MEDICAL CARE IF:   You develop a rash.  You have difficulty breathing.  You have any allergic problems. Document Released: 04/27/2000 Document Revised: 07/23/2011 Document Reviewed: 11/19/2007 [facility] Patient Information 2015 [place], [place]. This information is not intended to replace advice given to you by your health care provider. Make sure you discuss any questions you have with your health care provider.

## 2014-12-09 NOTE — Anesthesia Procedure Notes (Signed)
Procedure Name: LMA Insertion Date/Time: 12/09/2014 8:18 AM Performed by: Tressie Stalker E Pre-anesthesia Checklist: Patient identified, Patient being monitored, Emergency Drugs available, Timeout performed and Suction available Patient Re-evaluated:Patient Re-evaluated prior to inductionOxygen Delivery Method: Circle System Utilized Preoxygenation: Pre-oxygenation with 100% oxygen Intubation Type: IV induction Ventilation: Mask ventilation without difficulty LMA: LMA inserted LMA Size: 4.0 Number of attempts: 1 Placement Confirmation: positive ETCO2 and breath sounds checked- equal and bilateral

## 2014-12-09 NOTE — Anesthesia Preprocedure Evaluation (Signed)
Anesthesia Evaluation  Patient identified by MRN, date of birth, ID band Patient awake    Reviewed: Allergy & Precautions, NPO status , Patient's Chart, lab work & pertinent test results  Airway Mallampati: II  TM Distance: >3 FB     Dental  (+) Teeth Intact   Pulmonary neg pulmonary ROS,  breath sounds clear to auscultation        Cardiovascular hypertension, Pt. on medications Rhythm:Regular Rate:Normal     Neuro/Psych    GI/Hepatic GERD-  Controlled,  Endo/Other    Renal/GU      Musculoskeletal   Abdominal   Peds  Hematology   Anesthesia Other Findings   Reproductive/Obstetrics                             Anesthesia Physical Anesthesia Plan  ASA: II  Anesthesia Plan: General   Post-op Pain Management:    Induction: Intravenous  Airway Management Planned: LMA  Additional Equipment:   Intra-op Plan:   Post-operative Plan: Extubation in OR  Informed Consent: I have reviewed the patients History and Physical, chart, labs and discussed the procedure including the risks, benefits and alternatives for the proposed anesthesia with the patient or authorized representative who has indicated his/her understanding and acceptance.     Plan Discussed with:   Anesthesia Plan Comments:         Anesthesia Quick Evaluation

## 2014-12-09 NOTE — Brief Op Note (Signed)
12/09/2014  9:18 AM  PATIENT:  Brittany Durham  44 y.o. female  PRE-OPERATIVE DIAGNOSIS:  tear left knee medial meniscus  POST-OPERATIVE DIAGNOSIS:  tear left knee medial and lateral meniscus  PROCEDURE:  Procedure(s): KNEE ARTHROSCOPY WITH PARTIAL MEDIAL MENISECTOMY (Left) and partial lateral menisectomy  SURGEON:  Surgeon(s) and Role:    * Sanjuana Kava, MD - Primary  PHYSICIAN ASSISTANT:   ASSISTANTS: none   ANESTHESIA:   general  EBL:  Total I/O In: 500 [I.V.:500] Out: -   BLOOD ADMINISTERED:none  DRAINS: none   LOCAL MEDICATIONS USED:  MARCAINE     SPECIMEN:  Source of Specimen:  Left  knee medial and lateral meniscus shavings  DISPOSITION OF SPECIMEN:  PATHOLOGY  COUNTS:  YES  TOURNIQUET:  39 minutes  DICTATION: .Other Dictation: Dictation Number [id number]  PLAN OF CARE: Discharge to home after PACU  PATIENT DISPOSITION:  PACU - hemodynamically stable.   Delay start of Pharmacological VTE agent (>24hrs) due to surgical blood loss or risk of bleeding: not applicable

## 2014-12-09 NOTE — Transfer of Care (Signed)
Immediate Anesthesia Transfer of Care Note  Patient: Brittany Durham  Procedure(s) Performed: Procedure(s): KNEE ARTHROSCOPY WITH PARTIAL MEDIAL AND LATERAL MENISECTOMY (Left)  Patient Location: PACU  Anesthesia Type:General  Level of Consciousness: sedated  Airway & Oxygen Therapy: Patient Spontanous Breathing and Patient connected to face mask oxygen  Post-op Assessment: Report given to RN  Post vital signs: Reviewed and stable  Last Vitals:  Filed Vitals:   12/09/14 0800  BP: 134/96  Temp:   Resp: 32    Complications: No apparent anesthesia complications

## 2014-12-09 NOTE — Op Note (Signed)
NAME:  Brittany Durham, Brittany Durham               ACCOUNT NO.:  [id number]  MEDICAL RECORD NO.:  [id number]  LOCATION:  [facility]                          FACILITY:  [facility]  PHYSICIAN:  J. Sanjuana Kava, M.D. DATE OF BIRTH:  [birth year]  DATE OF PROCEDURE: DATE OF DISCHARGE:  12/09/2014                              OPERATIVE REPORT   PREOPERATIVE DIAGNOSIS:  Tear of medial and lateral meniscus.  POSTOPERATIVE DIAGNOSIS:  Tear of medial and lateral meniscus.  PROCEDURE:  Operative arthroscopy, partial medial and lateral meniscectomy of the left knee.  ANESTHESIA:  General.  TOURNIQUET TIME:  39 minutes.  SURGEON:  J. Sanjuana Kava, M.D.  DRAINS:  No drains.  INDICATIONS:  The patient has been having trouble with her left knee for over a year and a half or more.  She had an MRI that was negative and late March she had another MRI of the knee, because she has giving way of the knee, swelling, pain, they showed a tear of the lateral meniscus, the anterior horn and of the posterior horn of the medial meniscus. Surgery was recommended.  The patient was told about the arthroscopy procedures and elective procedure and she would decide when she wanted to have it done.  For various reasons, the patient decided to wait and have the surgery done, at this time.  She understands she will need physical therapy postoperatively.  She understood the risks and imponderables of the procedure.  DESCRIPTION OF PROCEDURE:  The patient was seen in the holding area. The left knee was identified as the correct surgical site.  I placed a mark on the left knee.  The patient was brought to the operating room and placed supine on the operating room table.  She was given general anesthesia.  Deflated tourniquet and leg holder placed on the left side. The patient prepped and draped in the usual manner.  She had a generalized time-out identifying the patient as Brittany Durham, and they were doing a left knee for arthroscopy.  All  instrumentation was properly positioned and working.  The OR team knew each other.  The leg was elevated, wrapped circumferentially with an Esmarch bandage. Esmarch bandage removed.  In-flow cannula inserted into the medial portion of the left knee and lactated Ringer's was instilled into the knee by an infusion pump.  Permanent pictures were taken.  The suprapatellar pouch looked good.  Undersurface patella looked good with only grade 2 changes.  Medially, there were grade 2 changes with a tear in the posterior horn of the medial meniscus.  Anterior cruciate was intact laterally.  There was a large degenerative type flap tear of the anterior portion of the lateral meniscus.  Posterior portion of the lateral meniscus looked good.  There were no loose bodies.  Attention directed to the medial meniscus using a meniscal shaver and meniscal punch.  The tear was removed and a good smooth contour was obtained.  Attention was then directed to the lateral side and the tear being mostly anterior.  It took a while to get a good visualization, using a meniscal shaver, but I did and was able to remove the flap tear from the anterior portion of the  lateral meniscus and got a smooth contour as it came around to go to posterior.  Permanent pictures were taken throughout the procedure.  Knee was systematically reexamined.  No new pathology found.  Knee irrigated with remaining part of lactated Ringer's.  Wound was reapproximated using 3-0 nylon interrupted vertical mattress manner.  Marcaine 0.25% instilled into each portal.  Tourniquet deflated after 39 minutes.  Sterile dressing applied.  Bulky dressing applied.  The patient is an outpatient and will go to recovery in good condition.  Appropriate analgesia will be given for pain.  I will see her in the office in approximately 10 days.  Physical therapy has been arranged.  If she has any difficulties, she will contact me through the office of the  hospital beeper system.          ______________________________ Brittany Durham. Sanjuana Kava, M.D.     JWK/MEDQ  D:  12/09/2014  T:  12/09/2014  Job:  [id number]

## 2014-12-10 ENCOUNTER — Encounter (HOSPITAL_COMMUNITY): Payer: Self-pay | Admitting: Orthopaedic Surgery

## 2014-12-10 NOTE — Anesthesia Postprocedure Evaluation (Signed)
  Anesthesia Post-op Note  Patient: Brittany Durham  Procedure(s) Performed: Procedure(s): KNEE ARTHROSCOPY WITH PARTIAL MEDIAL AND LATERAL MENISECTOMY (Left)  Patient Location: PACU  Anesthesia Type:General  Level of Consciousness: awake, alert , oriented and patient cooperative  Airway and Oxygen Therapy: Patient Spontanous Breathing  Post-op Pain: 2 /10, mild  Post-op Assessment: Post-op Vital signs reviewed, Patient's Cardiovascular Status Stable, Respiratory Function Stable, No signs of Nausea or vomiting and Pain level controlled              Post-op Vital Signs: Reviewed and stable  Last Vitals:  Filed Vitals:   12/09/14 1112  BP: 158/87  Pulse: 105  Temp: 37 C  Resp: 16    Complications: No apparent anesthesia complications

## 2014-12-12 ENCOUNTER — Encounter (HOSPITAL_COMMUNITY): Payer: Self-pay | Admitting: Emergency Medicine

## 2014-12-12 ENCOUNTER — Observation Stay (HOSPITAL_COMMUNITY)
Admission: EM | Admit: 2014-12-12 | Discharge: 2014-12-14 | Disposition: A | Discharge status: Home / Self Care | Payer: 59 | Attending: Internal Medicine | Admitting: Internal Medicine

## 2014-12-12 ENCOUNTER — Emergency Department (HOSPITAL_COMMUNITY): Payer: 59

## 2014-12-12 DIAGNOSIS — R000 Tachycardia, unspecified: Secondary | ICD-10-CM | POA: Insufficient documentation

## 2014-12-12 DIAGNOSIS — R55 Syncope and collapse: Secondary | ICD-10-CM | POA: Diagnosis present

## 2014-12-12 DIAGNOSIS — M25562 Pain in left knee: Secondary | ICD-10-CM | POA: Diagnosis present

## 2014-12-12 DIAGNOSIS — R4182 Altered mental status, unspecified: Secondary | ICD-10-CM | POA: Diagnosis not present

## 2014-12-12 DIAGNOSIS — Z79899 Other long term (current) drug therapy: Secondary | ICD-10-CM | POA: Diagnosis not present

## 2014-12-12 DIAGNOSIS — E559 Vitamin D deficiency, unspecified: Secondary | ICD-10-CM | POA: Diagnosis present

## 2014-12-12 DIAGNOSIS — E041 Nontoxic single thyroid nodule: Secondary | ICD-10-CM | POA: Diagnosis present

## 2014-12-12 DIAGNOSIS — I10 Essential (primary) hypertension: Secondary | ICD-10-CM | POA: Diagnosis present

## 2014-12-12 DIAGNOSIS — K219 Gastro-esophageal reflux disease without esophagitis: Secondary | ICD-10-CM | POA: Diagnosis present

## 2014-12-12 DIAGNOSIS — G459 Transient cerebral ischemic attack, unspecified: Secondary | ICD-10-CM | POA: Diagnosis not present

## 2014-12-12 DIAGNOSIS — R569 Unspecified convulsions: Secondary | ICD-10-CM

## 2014-12-12 DIAGNOSIS — R471 Dysarthria and anarthria: Principal | ICD-10-CM | POA: Insufficient documentation

## 2014-12-12 HISTORY — DX: Nontoxic single thyroid nodule: E04.1

## 2014-12-12 HISTORY — DX: Pain in unspecified knee: M25.569

## 2014-12-12 HISTORY — DX: Unspecified convulsions: R56.9

## 2014-12-12 LAB — DIFFERENTIAL
Basophils Absolute: 0 10*3/uL (ref 0.0–0.1)
Basophils Relative: 0 % (ref 0–1)
Eosinophils Absolute: 0.1 10*3/uL (ref 0.0–0.7)
Eosinophils Relative: 1 % (ref 0–5)
Lymphocytes Relative: 31 % (ref 12–46)
Lymphs Abs: 2.7 10*3/uL (ref 0.7–4.0)
Monocytes Absolute: 0.7 10*3/uL (ref 0.1–1.0)
Monocytes Relative: 8 % (ref 3–12)
Neutro Abs: 5.3 10*3/uL (ref 1.7–7.7)
Neutrophils Relative %: 60 % (ref 43–77)

## 2014-12-12 LAB — APTT: aPTT: 30 seconds (ref 24–37)

## 2014-12-12 LAB — I-STAT CHEM 8, ED
BUN: 20 mg/dL (ref 6–20)
Calcium, Ion: 1.23 mmol/L (ref 1.12–1.23)
Chloride: 100 mmol/L — ABNORMAL LOW (ref 101–111)
Creatinine, Ser: 0.8 mg/dL (ref 0.44–1.00)
Glucose, Bld: 108 mg/dL — ABNORMAL HIGH (ref 65–99)
HCT: 38 % (ref 36.0–46.0)
Hemoglobin: 12.9 g/dL (ref 12.0–15.0)
Potassium: 3 mmol/L — ABNORMAL LOW (ref 3.5–5.1)
Sodium: 141 mmol/L (ref 135–145)
TCO2: 23 mmol/L (ref 0–100)

## 2014-12-12 LAB — CBG MONITORING, ED: Glucose-Capillary: 99 mg/dL (ref 65–99)

## 2014-12-12 LAB — COMPREHENSIVE METABOLIC PANEL
ALT: 21 U/L (ref 14–54)
AST: 21 U/L (ref 15–41)
Albumin: 4.6 g/dL (ref 3.5–5.0)
Alkaline Phosphatase: 59 U/L (ref 38–126)
Anion gap: 10 (ref 5–15)
BUN: 21 mg/dL — ABNORMAL HIGH (ref 6–20)
CO2: 27 mmol/L (ref 22–32)
Calcium: 9.8 mg/dL (ref 8.9–10.3)
Chloride: 101 mmol/L (ref 101–111)
Creatinine, Ser: 0.76 mg/dL (ref 0.44–1.00)
GFR calc Af Amer: >60 mL/min (ref >60)
GFR calc non Af Amer: >60 mL/min (ref >60)
Glucose, Bld: 107 mg/dL — ABNORMAL HIGH (ref 65–99)
Potassium: 3 mmol/L — ABNORMAL LOW (ref 3.5–5.1)
Sodium: 138 mmol/L (ref 135–145)
Total Bilirubin: 0.6 mg/dL (ref 0.3–1.2)
Total Protein: 8.7 g/dL — ABNORMAL HIGH (ref 6.5–8.1)

## 2014-12-12 LAB — RAPID URINE DRUG SCREEN, HOSP PERFORMED
Amphetamines: NOT DETECTED
Barbiturates: NOT DETECTED
Benzodiazepines: NOT DETECTED
Cocaine: NOT DETECTED
Opiates: POSITIVE — AB
Tetrahydrocannabinol: NOT DETECTED

## 2014-12-12 LAB — URINE MICROSCOPIC-ADD ON

## 2014-12-12 LAB — CBC
HCT: 36.8 % (ref 36.0–46.0)
Hemoglobin: 12.5 g/dL (ref 12.0–15.0)
MCH: 30.6 pg (ref 26.0–34.0)
MCHC: 34 g/dL (ref 30.0–36.0)
MCV: 90.2 fL (ref 78.0–100.0)
Platelets: 230 10*3/uL (ref 150–400)
RBC: 4.08 MIL/uL (ref 3.87–5.11)
RDW: 12.3 % (ref 11.5–15.5)
WBC: 8.9 10*3/uL (ref 4.0–10.5)

## 2014-12-12 LAB — PROTIME-INR
INR: 1 (ref 0.00–1.49)
Prothrombin Time: 13.4 seconds (ref 11.6–15.2)

## 2014-12-12 LAB — URINALYSIS, ROUTINE W REFLEX MICROSCOPIC
Bilirubin Urine: NEGATIVE
Glucose, UA: NEGATIVE mg/dL
Leukocytes, UA: NEGATIVE
Nitrite: NEGATIVE
Protein, ur: NEGATIVE mg/dL
Specific Gravity, Urine: 1.01 (ref 1.005–1.030)
Urobilinogen, UA: 0.2 mg/dL (ref 0.0–1.0)
pH: 7.5 (ref 5.0–8.0)

## 2014-12-12 LAB — I-STAT TROPONIN, ED: Troponin i, poc: 0 ng/mL (ref 0.00–0.08)

## 2014-12-12 LAB — ACETAMINOPHEN LEVEL: Acetaminophen (Tylenol), Serum: <10 ug/mL — ABNORMAL LOW (ref 10–30)

## 2014-12-12 LAB — SALICYLATE LEVEL: Salicylate Lvl: <4 mg/dL (ref 2.8–30.0)

## 2014-12-12 LAB — ETHANOL: Alcohol, Ethyl (B): <5 mg/dL (ref <5)

## 2014-12-12 MED ORDER — POTASSIUM CHLORIDE 10 MEQ/100ML IV SOLN
10.0000 meq | INTRAVENOUS | Status: AC
  Administered 2014-12-12 – 2014-12-13 (×3): 10 meq via INTRAVENOUS
  Filled 2014-12-12 (×3): qty 100

## 2014-12-12 MED ORDER — HYDROMORPHONE HCL 1 MG/ML IJ SOLN
1.0000 mg | INTRAMUSCULAR | Status: DC | PRN
Start: 2014-12-12 — End: 2014-12-13
  Administered 2014-12-13: 1 mg via INTRAVENOUS
  Filled 2014-12-12: qty 1

## 2014-12-12 MED ORDER — HYDROMORPHONE HCL 1 MG/ML IJ SOLN
1.0000 mg | Freq: Once | INTRAMUSCULAR | Status: AC
  Administered 2014-12-12: 1 mg via INTRAVENOUS
  Filled 2014-12-12: qty 1

## 2014-12-12 MED ORDER — ENOXAPARIN SODIUM 40 MG/0.4ML ~~LOC~~ SOLN
40.0000 mg | SUBCUTANEOUS | Status: DC
  Administered 2014-12-13 – 2014-12-14 (×2): 40 mg via SUBCUTANEOUS
  Filled 2014-12-12 (×2): qty 0.4

## 2014-12-12 MED ORDER — SODIUM CHLORIDE 0.9 % IV SOLN
INTRAVENOUS | Status: AC
  Administered 2014-12-12: via INTRAVENOUS

## 2014-12-12 MED ORDER — SODIUM CHLORIDE 0.9 % IV BOLUS (SEPSIS): 1000.0000 mL | Freq: Once | INTRAVENOUS | Status: DC

## 2014-12-12 MED ORDER — ASPIRIN 325 MG PO TABS: 325.0000 mg | ORAL_TABLET | Freq: Every day | ORAL | Status: DC

## 2014-12-12 MED ORDER — POTASSIUM CHLORIDE CRYS ER 20 MEQ PO TBCR
40.0000 meq | EXTENDED_RELEASE_TABLET | Freq: Once | ORAL | Status: AC
  Administered 2014-12-12: 40 meq via ORAL
  Filled 2014-12-12: qty 2

## 2014-12-12 MED ORDER — SODIUM CHLORIDE 0.9 % IV BOLUS (SEPSIS)
1000.0000 mL | Freq: Once | INTRAVENOUS | Status: AC
  Administered 2014-12-12: 1000 mL via INTRAVENOUS

## 2014-12-12 MED ORDER — STROKE: EARLY STAGES OF RECOVERY BOOK
Freq: Once | Status: AC
  Administered 2014-12-13: 12:00:00
  Filled 2014-12-12: qty 1

## 2014-12-12 NOTE — H&P (Addendum)
PCP:   Sharion Balloon, FNP   Chief Complaint:  Passed out  HPI: 44 year old female who   has a past medical history of Hypertension and Seizures. was brought to the emergency department after patient had episode of syncope. As per patient's daughter who witnessed the episode, patient had left knee surgery 2 days prior and was experiencing pain in the left knee. As she began unwrapping the dressing of the knee patient started hyperventilating and then passed out. As per daughter patient was out for couple of minutes and then had a blank stare in her eyes. There was normal witnessed seizure-like activity, no biting of tongue no loss of bladder or bowel control. Patient denies any headache no chest pain shortness of breath. No blurred vision. Patient was seen by teleneurology and did recommended to keep the patient in the hospital for observation for possible seizure or syncope versus TIA. Patient has a history of seizures in the past, as per daughter patient had one episode of seizure one year ago. She is currently not on any anti-epileptic medications. CT head is negative for CVA in the ED . Patient does have a slurred speech  Allergies:  No Known Allergies    Past Medical History  Diagnosis Date  . Hypertension   . Seizures     Past Surgical History  Procedure Laterality Date  . Tubal ligation    . Wisdom tooth extraction    . Knee arthroscopy with medial menisectomy Left 12/09/2014    Procedure: KNEE ARTHROSCOPY WITH PARTIAL MEDIAL AND LATERAL MENISECTOMY;  Surgeon: Sanjuana Kava, MD;  Location: [facility];  Service: Orthopedics;  Laterality: Left;    Prior to Admission medications   Medication Sig Start Date End Date Taking? Authorizing Provider  amLODipine (NORVASC) 10 MG tablet Take 1 tablet (10 mg total) by mouth daily. 08/20/14  Yes Sharion Balloon, FNP  HYDROcodone-acetaminophen (NORCO) 7.5-325 MG per tablet Take 1 tablet by mouth every 4 (four) hours as needed for moderate  pain. 12/09/14  Yes Sanjuana Kava, MD  lisinopril-hydrochlorothiazide (ZESTORETIC) 20-12.5 MG per tablet Take 2 tablets by mouth daily. 07/19/14  Yes Sharion Balloon, FNP  ibuprofen (ADVIL,MOTRIN) 200 MG tablet Take 400 mg by mouth every 6 (six) hours as needed for moderate pain.    Historical Provider, MD  Vitamin D, Ergocalciferol, (DRISDOL) 50000 UNITS CAPS capsule Take 1 capsule (50,000 Units total) by mouth every 7 (seven) days. 06/30/14   Sharion Balloon, FNP    Social History:  reports that she has never smoked. She has never used smokeless tobacco. She reports that she does not drink alcohol or use illicit drugs.  Family History  Problem Relation Age of Onset  . Hypertension Mother   . Hypertension Brother     Danley Danker Weights   12/12/14 2032  Weight: 83.915 kg (185 lb)    All the positives are listed in BOLD  Review of Systems:  HEENT: Headache, blurred vision, runny nose, sore throat Neck: Hypothyroidism, hyperthyroidism,,lymphadenopathy Chest : Shortness of breath, history of COPD, Asthma Heart : Chest pain, history of coronary arterey disease GI:  Nausea, vomiting, diarrhea, constipation, GERD GU: Dysuria, urgency, frequency of urination, hematuria Neuro: Stroke, seizures, syncope Psych: Depression, anxiety, hallucinations   Physical Exam: Blood pressure 161/105, pulse 119, temperature 98.8 F (37.1 C), temperature source Oral, resp. rate 22, height 5\' 7"  (1.702 m), weight 83.915 kg (185 lb), last menstrual period 12/03/2014, SpO2 100 %. Constitutional:   Patient is a well-developed and  well-nourished anxious appearing female, appears in distress due to the left knee pain. Head: Normocephalic and atraumatic Mouth: Mucus membranes moist Eyes: PERRL, EOMI, conjunctivae normal Neck: Supple, No Thyromegaly Cardiovascular: RRR, S1 normal, S2 normal Pulmonary/Chest: CTAB, no wheezes, rales, or rhonchi Abdominal: Soft. Non-tender, non-distended, bowel sounds are normal, no  masses, organomegaly, or guarding present.  Neurological: A&O x3, Strength is normal and symmetric bilaterally, cranial nerve II-XII are grossly intact, no focal motor deficit, sensory intact to light touch bilaterally.  Extremities : Left knee swollen, sutures in place, no erythema noted.  Labs on Admission:  Basic Metabolic Panel:  Recent Labs Lab 12/06/14 0940 12/12/14 2038 12/12/14 2055  NA 137 138 141  K 3.4* 3.0* 3.0*  CL 103 101 100*  CO2 28 27  --   GLUCOSE 97 107* 108*  BUN 14 21* 20  CREATININE 0.68 0.76 0.80  CALCIUM 9.4 9.8  --    Liver Function Tests:  Recent Labs Lab 12/06/14 0940 12/12/14 2038  AST 19 21  ALT 22 21  ALKPHOS 53 59  BILITOT 0.7 0.6  PROT 7.4 8.7*  ALBUMIN 4.1 4.6    CBC:  Recent Labs Lab 12/06/14 0940 12/12/14 2038 12/12/14 2055  WBC 4.6 8.9  --   NEUTROABS 2.6 5.3  --   HGB 11.7* 12.5 12.9  HCT 34.4* 36.8 38.0  MCV 90.3 90.2  --   PLT 196 230  --     CBG:  Recent Labs Lab 12/12/14 2137  GLUCAP 99    Radiological Exams on Admission: Ct Head Wo Contrast  12/12/2014   CLINICAL DATA:  Initial evaluation for acute altered mental status.  EXAM: CT HEAD WITHOUT CONTRAST  TECHNIQUE: Contiguous axial images were obtained from the base of the skull through the vertex without intravenous contrast.  COMPARISON:  Prior study from 11/01/2011  FINDINGS: There is no acute intracranial hemorrhage or infarct. No mass lesion or midline shift. Gray-white matter differentiation is well maintained. Ventricles are normal in size without evidence of hydrocephalus. CSF containing spaces are within normal limits. No extra-axial fluid collection.  The calvarium is intact.  Orbital soft tissues are within normal limits.  The paranasal sinuses and mastoid air cells are well pneumatized and free of fluid.  Scalp soft tissues are unremarkable.  IMPRESSION: Normal head CT with no acute intracranial process identified.   Electronically Signed   By: Jeannine Boga M.D.   On: 12/12/2014 21:20    EKG: Independently reviewed. Sinus tachycardia   Assessment/Plan Active Problems:   HYPERTENSION, BENIGN   Altered mental status   Syncope    Syncope seizure Patient has a history of seizure disorder, and one episode of seizure one year ago. Patient could have had vasovagal syncope attack because of the extreme pain and hyperventilation, will admit the patient for further workup for seizure. Obtain EEG, echocardiogram in a.m.  ? TIA Tele neurology was consulted by the ED physician, and he recommended the patient to be observed overnight for possible TIA and workup including MRI/MRA brain, echocardiogram, carotid Doppler. Patient has no neurological deficit, but has slurred speech  Hypertension Patient's blood pressure was elevated when she came to the hospital, will hold the blood pressure medications ~stroke is ruled out by MRI brain  Hypokalemia The patient is potassium and check BMP in a.m .  Recent knee surgery We'll start the patient on Dilaudid 1 mg every 4 hours when necessary for pain  DVT prophylaxis Lovenox  Code status: Full code  Family discussion: Admission, patients condition and plan of care including tests being ordered have been discussed with the patient and her daughter and mother bedside* who indicate understanding and agree with the plan and Code Status.   Time Spent on Admission: 60 min  [facility] Pager: [phone] 12/12/2014, 10:13 PM  If 7PM-7AM, please contact night-coverage  [email or web address]  Password [email or web address]

## 2014-12-12 NOTE — ED Notes (Signed)
Dilaudid given was actually ordered by Dr Darrick Meigs and not Anastasio Champion.

## 2014-12-12 NOTE — ED Provider Notes (Signed)
CSN: [id number]     Arrival date & time 12/12/14  2016 History   This chart was scribed for Ezequiel Essex, MD by Randa Evens, ED Scribe. This patient was seen in room [facility] and the patient's care was started at 8:28 PM.      Chief Complaint  Patient presents with  . Near Syncope   The history is provided by the EMS personnel.   HPI Comments: Level 5 Caveat: Mental status change Brittany Durham is a 44 y.o. female brought in by ambulance, who presents to the Emergency Department complaining of new syncopal episode today PTA. EMS states that she has been mostly lethargic and disoriented. Last seen normal 1 hour ago. Pt has had surgery on left knee 3 days prior. Per EMS the family states that the pt began unwrapping her knee, started hyperventilating and then passed out. Hx of HTN and seizures. Denies HA, CP, SOB or abdominal pain. Denies alcohol or drug use.    Past Medical History  Diagnosis Date  . Hypertension   . Seizures    Past Surgical History  Procedure Laterality Date  . Tubal ligation    . Wisdom tooth extraction    . Knee arthroscopy with medial menisectomy Left 12/09/2014    Procedure: KNEE ARTHROSCOPY WITH PARTIAL MEDIAL AND LATERAL MENISECTOMY;  Surgeon: Sanjuana Kava, MD;  Location: [facility];  Service: Orthopedics;  Laterality: Left;   Family History  Problem Relation Age of Onset  . Hypertension Mother   . Hypertension Brother    History  Substance Use Topics  . Smoking status: Never Smoker   . Smokeless tobacco: Never Used  . Alcohol Use: No   OB History    Gravida Para Term Preterm AB TAB SAB Ectopic Multiple Living   1 1 1              Review of Systems  Unable to perform ROS: Mental status change  Cardiovascular: Positive for near-syncope.  Neurological: Positive for syncope.  Psychiatric/Behavioral: Positive for confusion.     Allergies  Review of patient's allergies indicates no known allergies.  Home Medications   Prior to Admission  medications   Medication Sig Start Date End Date Taking? Authorizing Provider  amLODipine (NORVASC) 10 MG tablet Take 1 tablet (10 mg total) by mouth daily. 08/20/14  Yes Sharion Balloon, FNP  HYDROcodone-acetaminophen (NORCO) 7.5-325 MG per tablet Take 1 tablet by mouth every 4 (four) hours as needed for moderate pain. 12/09/14  Yes Sanjuana Kava, MD  lisinopril-hydrochlorothiazide (ZESTORETIC) 20-12.5 MG per tablet Take 2 tablets by mouth daily. 07/19/14  Yes Sharion Balloon, FNP  ibuprofen (ADVIL,MOTRIN) 200 MG tablet Take 400 mg by mouth every 6 (six) hours as needed for moderate pain.    Historical Provider, MD  Vitamin D, Ergocalciferol, (DRISDOL) 50000 UNITS CAPS capsule Take 1 capsule (50,000 Units total) by mouth every 7 (seven) days. 06/30/14   Sharion Balloon, FNP   BP 136/67 mmHg  Pulse 93  Temp(Src) 98.7 F (37.1 C) (Oral)  Resp 16  Ht 5\' 7"  (1.702 m)  Wt 189 lb 13.1 oz (86.1 kg)  BMI 29.72 kg/m2  SpO2 96%  LMP 12/03/2014   Physical Exam  Constitutional: She appears well-developed and well-nourished. No distress.  Tearful. Mumbles words/   HENT:  Head: Normocephalic and atraumatic.  Mouth/Throat: Oropharynx is clear and moist. No oropharyngeal exudate.  Eyes: Conjunctivae and EOM are normal. Pupils are equal, round, and reactive to light.  Neck: Normal range of  motion. Neck supple.  No meningismus.  Cardiovascular: Regular rhythm, normal heart sounds and intact distal pulses.  Tachycardia present.   No murmur heard. Tachycardiac to 140.   Pulmonary/Chest: Effort normal and breath sounds normal. No respiratory distress.  Abdominal: Soft. There is no tenderness. There is no rebound and no guarding.  Musculoskeletal: Normal range of motion. She exhibits no edema or tenderness.  Left knee with surgical incisions, no erythema,   Neurological: She is alert.  Alert. Mumbling speech, no facial droop. Equal grip strength bilaterally. Will not hold either arm or leg off the bed but  will protect face from falling arm. Unable to cooperate with rest of exam.   Skin: Skin is warm.  Psychiatric: She has a normal mood and affect. Her behavior is normal.  Nursing note and vitals reviewed.   ED Course  Procedures (including critical care time) DIAGNOSTIC STUDIES: Oxygen Saturation is 98% on RA, normal by my interpretation.    COORDINATION OF CARE:    Labs Review Labs Reviewed  COMPREHENSIVE METABOLIC PANEL - Abnormal; Notable for the following:    Potassium 3.0 (*)    Glucose, Bld 107 (*)    BUN 21 (*)    Total Protein 8.7 (*)    All other components within normal limits  URINE RAPID DRUG SCREEN, HOSP PERFORMED - Abnormal; Notable for the following:    Opiates POSITIVE (*)    All other components within normal limits  URINALYSIS, ROUTINE W REFLEX MICROSCOPIC (NOT AT [facility]) - Abnormal; Notable for the following:    Hgb urine dipstick TRACE (*)    Ketones, ur TRACE (*)    All other components within normal limits  ACETAMINOPHEN LEVEL - Abnormal; Notable for the following:    Acetaminophen (Tylenol), Serum <10 (*)    All other components within normal limits  GLUCOSE, CAPILLARY - Abnormal; Notable for the following:    Glucose-Capillary 103 (*)    All other components within normal limits  I-STAT CHEM 8, ED - Abnormal; Notable for the following:    Potassium 3.0 (*)    Chloride 100 (*)    Glucose, Bld 108 (*)    All other components within normal limits  ETHANOL  PROTIME-INR  APTT  CBC  DIFFERENTIAL  SALICYLATE LEVEL  URINE MICROSCOPIC-ADD ON  HEMOGLOBIN A1C  LIPID PANEL  URINE RAPID DRUG SCREEN, HOSP PERFORMED  I-STAT TROPOININ, ED  CBG MONITORING, ED    Imaging Review Ct Head Wo Contrast  12/12/2014   CLINICAL DATA:  Initial evaluation for acute altered mental status.  EXAM: CT HEAD WITHOUT CONTRAST  TECHNIQUE: Contiguous axial images were obtained from the base of the skull through the vertex without intravenous contrast.  COMPARISON:  Prior study  from 11/01/2011  FINDINGS: There is no acute intracranial hemorrhage or infarct. No mass lesion or midline shift. Gray-white matter differentiation is well maintained. Ventricles are normal in size without evidence of hydrocephalus. CSF containing spaces are within normal limits. No extra-axial fluid collection.  The calvarium is intact.  Orbital soft tissues are within normal limits.  The paranasal sinuses and mastoid air cells are well pneumatized and free of fluid.  Scalp soft tissues are unremarkable.  IMPRESSION: Normal head CT with no acute intracranial process identified.   Electronically Signed   By: Jeannine Boga M.D.   On: 12/12/2014 21:20     EKG Interpretation   Date/Time:  Sunday December 12 2014 20:49:20 EDT Ventricular Rate:  119 PR Interval:  171 QRS Duration:  79 QT Interval:  328 QTC Calculation: 461 R Axis:   50 Text Interpretation:  Sinus tachycardia Atrial premature complex Consider  left ventricular hypertrophy No significant change was found Confirmed by  Wyvonnia Dusky  MD, Loyed Wilmes [phone]) on 12/12/2014 8:57:49 PM      MDM   Final diagnoses:  TIA (transient ischemic attack)  TIA (transient ischemic attack)   change in mental status after episode of hyperventilation while changing the bandage. Status post knee arthroscopy 3 days ago by Dr. Luna Glasgow. EMS reports patient began hyperventilating had a syncopal episode. Now she is altered and not answering questions.  Her presentation is not consistent with acute CVA. She will not hold up either arm or leg off the bed but she protects her face from falling arm.  CT head negative.  Labs show hypokalemia.  Exam improving. Patient seen by tele neurologist. He feels she may have had a syncopal episode from hyperventilation, cannot rule out TIA, cannot rule out seizure activity. He recommends admission for observation.  Patient still was stuttering and halting speech. She is alert and oriented. She denies any pain other than her  knee. She remembers changing the bandage in her knee at home and having hyperventilating and feeling like she is going to pass out. Her strength in her upper extremities is normal. She is able to hold off the bed against gravity at this time. She denies any history of seizures and does not take any seizure medication. Denies alcohol or drug abuse.  Neurology consult receives. Dr. Rip Harbour recommends aspirin, MRI brain, carotid ultrasound, echocardiogram and EEG.  Tachycardia improving to 100s.  CRITICAL CARE Performed by: Ezequiel Essex Total critical care time: 30 Critical care time was exclusive of separately billable procedures and treating other patients. Critical care was necessary to treat or prevent imminent or life-threatening deterioration. Critical care was time spent personally by me on the following activities: development of treatment plan with patient and/or surrogate as well as nursing, discussions with consultants, evaluation of patient's response to treatment, examination of patient, obtaining history from patient or surrogate, ordering and performing treatments and interventions, ordering and review of laboratory studies, ordering and review of radiographic studies, pulse oximetry and re-evaluation of patient's condition.   I personally performed the services described in this documentation, which was scribed in my presence. The recorded information has been reviewed and is accurate.   Ezequiel Essex, MD 12/13/14 [phone]

## 2014-12-12 NOTE — ED Notes (Signed)
Pt brought in via EMS from home after family stated she "passed out" while trying to change her dressing to L. Knee . When EMS arrived they found pt sitting on couch lethargic and unable to hold up arms or speak. CBG was 88. Pt tearful on arrival and has startled affect whenever name is called. Pt has equal grip strengths but unable to hold up arms on command.

## 2014-12-13 ENCOUNTER — Inpatient Hospital Stay (HOSPITAL_COMMUNITY): Payer: 59

## 2014-12-13 ENCOUNTER — Observation Stay (HOSPITAL_BASED_OUTPATIENT_CLINIC_OR_DEPARTMENT_OTHER): Payer: 59

## 2014-12-13 ENCOUNTER — Other Ambulatory Visit (HOSPITAL_COMMUNITY): Payer: 59

## 2014-12-13 ENCOUNTER — Ambulatory Visit: Payer: 59 | Admitting: Physical Therapy

## 2014-12-13 DIAGNOSIS — I10 Essential (primary) hypertension: Secondary | ICD-10-CM | POA: Diagnosis not present

## 2014-12-13 DIAGNOSIS — R569 Unspecified convulsions: Secondary | ICD-10-CM

## 2014-12-13 DIAGNOSIS — R55 Syncope and collapse: Secondary | ICD-10-CM

## 2014-12-13 DIAGNOSIS — K219 Gastro-esophageal reflux disease without esophagitis: Secondary | ICD-10-CM | POA: Diagnosis not present

## 2014-12-13 LAB — RAPID URINE DRUG SCREEN, HOSP PERFORMED
Amphetamines: NOT DETECTED
Barbiturates: NOT DETECTED
Benzodiazepines: NOT DETECTED
Cocaine: NOT DETECTED
Opiates: POSITIVE — AB
Tetrahydrocannabinol: NOT DETECTED

## 2014-12-13 LAB — GLUCOSE, CAPILLARY
Glucose-Capillary: 103 mg/dL — ABNORMAL HIGH (ref 65–99)
Glucose-Capillary: 108 mg/dL — ABNORMAL HIGH (ref 65–99)
Glucose-Capillary: 118 mg/dL — ABNORMAL HIGH (ref 65–99)
Glucose-Capillary: 127 mg/dL — ABNORMAL HIGH (ref 65–99)

## 2014-12-13 LAB — LIPID PANEL
Cholesterol: 155 mg/dL (ref 0–200)
HDL: 53 mg/dL (ref >40)
LDL Cholesterol: 92 mg/dL (ref 0–99)
Total CHOL/HDL Ratio: 2.9 RATIO
Triglycerides: 49 mg/dL (ref <150)
VLDL: 10 mg/dL (ref 0–40)

## 2014-12-13 LAB — MAGNESIUM: Magnesium: 1.8 mg/dL (ref 1.7–2.4)

## 2014-12-13 MED ORDER — ASPIRIN 325 MG PO TABS
325.0000 mg | ORAL_TABLET | Freq: Every day | ORAL | Status: DC
  Administered 2014-12-14: 325 mg via ORAL
  Filled 2014-12-13: qty 1

## 2014-12-13 MED ORDER — HYDROCODONE-ACETAMINOPHEN 7.5-325 MG PO TABS
1.0000 | ORAL_TABLET | ORAL | Status: DC | PRN
  Administered 2014-12-14: 1 via ORAL
  Filled 2014-12-13: qty 1

## 2014-12-13 MED ORDER — IBUPROFEN 400 MG PO TABS: 400.0000 mg | ORAL_TABLET | Freq: Four times a day (QID) | ORAL | Status: DC | PRN

## 2014-12-13 MED ORDER — LORAZEPAM 2 MG/ML IJ SOLN
1.0000 mg | Freq: Once | INTRAMUSCULAR | Status: AC
  Administered 2014-12-13: 1 mg via INTRAVENOUS
  Filled 2014-12-13: qty 1

## 2014-12-13 MED ORDER — ASPIRIN 300 MG RE SUPP
300.0000 mg | Freq: Every day | RECTAL | Status: DC
  Filled 2014-12-13: qty 1

## 2014-12-13 MED ORDER — POTASSIUM CHLORIDE CRYS ER 20 MEQ PO TBCR
40.0000 meq | EXTENDED_RELEASE_TABLET | ORAL | Status: AC
  Administered 2014-12-13 (×2): 40 meq via ORAL
  Filled 2014-12-13 (×2): qty 2

## 2014-12-13 MED ORDER — ASPIRIN 325 MG PO TABS
325.0000 mg | ORAL_TABLET | Freq: Once | ORAL | Status: AC
  Administered 2014-12-13: 325 mg via ORAL
  Filled 2014-12-13: qty 1

## 2014-12-13 MED ORDER — AMLODIPINE BESYLATE 5 MG PO TABS
10.0000 mg | ORAL_TABLET | Freq: Every day | ORAL | Status: DC
  Administered 2014-12-13 – 2014-12-14 (×2): 10 mg via ORAL
  Filled 2014-12-13 (×2): qty 2

## 2014-12-13 NOTE — Consult Note (Signed)
[place] A. Merlene Laughter, MD     [email or web address]          Brittany Durham is an 44 y.o. female.   ASSESSMENT/PLAN: 1. Multiple episodes of syncope of unclear etiology. However, it appears that the pain associated with her left knee surgery is most likely culprit. She seemed to have had a severe reaction to this pain resulted in the syncopal episodes. No need for antiepileptic medications at this time. The EEG will be followed up. 2. 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 3. Hypertension.  The patient is a 44 year old white female who underwent left orthoscopic knee surgery for torn ligament. This was about a few days ago. The patient was at home resting when she was taking care of the knee after surgery. He requires use of peroxide to the wound site. She was getting this done by her daughter when she developed severe intense pain to the point of irritation tented up and crying. The daughter reported the patient passed out momentarily. She became unresponsive with eyes closed. This probably lasted for several seconds to a couple minutes. The patient went on to have another spell later on in fact had a total of about 5 events. The events were relatively similar with the patient tends not and stiffening becoming unresponsive with eyes closed. At one time however, she did have her eyes open and was unresponsive. No clonic activities are reported. No oral trauma or urinary incontinence. No bowel incontinence. No chest pain or shortness of breath. No GI GU symptoms. The patient became dysarthric with slurring of her speech and stammering after the events. The patient has been worked up rather extensively with only accelerated hypertension noted but otherwise imaging and other tests have been unrevealing. The daughter tells me that the patient has had a similar back a spell in the past a few months ago. She also had one about a few years ago. She apparently was  worked up for one of these events 3 years ago and this was unrevealing. The patient is at baseline but has residual difficulty with her speech. Review of systems otherwise negative.  GENERAL: This is a pleasant female who appears about 61 years older than the stated age. She is in no acute distress.  HEENT: Supple. Atraumatic normocephalic.   ABDOMEN: soft  EXTREMITIES: No edema. Three small and incisions noted of the left knee. She is weak in the left knee due to the pain postsurgically.   BACK: Normal.  SKIN: Normal by inspection.    MENTAL STATUS: Alert and oriented. Speech - shows a stammering dysarthria; language and cognition are generally intact. Judgment and insight normal.   CRANIAL NERVES: Pupils are equal, round and reactive to light and accommodation; extra ocular movements are full, there is no significant nystagmus; visual fields are full; upper and lower facial muscles are normal in strength and symmetric, there is no flattening of the nasolabial folds; tongue is midline; uvula is midline; shoulder elevation is normal.  MOTOR: Normal tone, bulk and strength involving the upper extremities; no pronator drift. Right leg shows normal tone, bulk and strength. Left leg is weak most do the pain strength graded as 2/5 hip flexion and dorsiflexion 4.  COORDINATION: Left finger to nose is normal, right finger to nose is normal, No rest tremor; no intention tremor; no postural tremor; no bradykinesia.  REFLEXES: Deep tendon reflexes are symmetrical and normal. Babinski reflexes are flexor bilaterally.   SENSATION: Normal to light touch.  Blood pressure 150/86, pulse 109, temperature 97.7 F (36.5 C), temperature source Oral, resp. rate 20, height 5' 7"  (1.702 m), weight 86.1 kg (189 lb 13.1 oz), last menstrual period 12/03/2014, SpO2 100 %.  Past Medical History  Diagnosis Date  . Hypertension   . Seizures     Past Surgical History  Procedure Laterality Date  . Tubal  ligation    . Wisdom tooth extraction    . Knee arthroscopy with medial menisectomy Left 12/09/2014    Procedure: KNEE ARTHROSCOPY WITH PARTIAL MEDIAL AND LATERAL MENISECTOMY;  Surgeon: Sanjuana Kava, MD;  Location: [facility];  Service: Orthopedics;  Laterality: Left;    Family History  Problem Relation Age of Onset  . Hypertension Mother   . Hypertension Brother     Social History:  reports that she has never smoked. She has never used smokeless tobacco. She reports that she does not drink alcohol or use illicit drugs.  Allergies: No Known Allergies  Medications: Prior to Admission medications   Medication Sig Start Date End Date Taking? Authorizing Provider  amLODipine (NORVASC) 10 MG tablet Take 1 tablet (10 mg total) by mouth daily. 08/20/14  Yes Sharion Balloon, FNP  HYDROcodone-acetaminophen (NORCO) 7.5-325 MG per tablet Take 1 tablet by mouth every 4 (four) hours as needed for moderate pain. 12/09/14  Yes Sanjuana Kava, MD  lisinopril-hydrochlorothiazide (ZESTORETIC) 20-12.5 MG per tablet Take 2 tablets by mouth daily. 07/19/14  Yes Sharion Balloon, FNP  ibuprofen (ADVIL,MOTRIN) 200 MG tablet Take 400 mg by mouth every 6 (six) hours as needed for moderate pain.    Historical Provider, MD  Vitamin D, Ergocalciferol, (DRISDOL) 50000 UNITS CAPS capsule Take 1 capsule (50,000 Units total) by mouth every 7 (seven) days. 06/30/14   Sharion Balloon, FNP    Scheduled Meds: . amLODipine  10 mg Oral Daily  . [START ON 12/14/2014] aspirin  325 mg Oral Daily  . enoxaparin (LOVENOX) injection  40 mg Subcutaneous Q24H   Continuous Infusions:  PRN Meds:.HYDROcodone-acetaminophen, ibuprofen     Results for orders placed or performed during the hospital encounter of 12/12/14 (from the past 48 hour(s))  Ethanol     Status: None   Collection Time: 12/12/14  8:38 PM  Result Value Ref Range   Alcohol, Ethyl (B) <5 <5 mg/dL    Comment:        LOWEST DETECTABLE LIMIT FOR SERUM ALCOHOL IS 5 mg/dL FOR  MEDICAL PURPOSES ONLY   Protime-INR     Status: None   Collection Time: 12/12/14  8:38 PM  Result Value Ref Range   Prothrombin Time 13.4 11.6 - 15.2 seconds   INR 1.00 0.00 - 1.49  APTT     Status: None   Collection Time: 12/12/14  8:38 PM  Result Value Ref Range   aPTT 30 24 - 37 seconds  CBC     Status: None   Collection Time: 12/12/14  8:38 PM  Result Value Ref Range   WBC 8.9 4.0 - 10.5 K/uL   RBC 4.08 3.87 - 5.11 MIL/uL   Hemoglobin 12.5 12.0 - 15.0 g/dL   HCT 36.8 36.0 - 46.0 %   MCV 90.2 78.0 - 100.0 fL   MCH 30.6 26.0 - 34.0 pg   MCHC 34.0 30.0 - 36.0 g/dL   RDW 12.3 11.5 - 15.5 %   Platelets 230 150 - 400 K/uL  Differential     Status: None   Collection Time: 12/12/14  8:38 PM  Result  Value Ref Range   Neutrophils Relative % 60 43 - 77 %   Neutro Abs 5.3 1.7 - 7.7 K/uL   Lymphocytes Relative 31 12 - 46 %   Lymphs Abs 2.7 0.7 - 4.0 K/uL   Monocytes Relative 8 3 - 12 %   Monocytes Absolute 0.7 0.1 - 1.0 K/uL   Eosinophils Relative 1 0 - 5 %   Eosinophils Absolute 0.1 0.0 - 0.7 K/uL   Basophils Relative 0 0 - 1 %   Basophils Absolute 0.0 0.0 - 0.1 K/uL  Comprehensive metabolic panel     Status: Abnormal   Collection Time: 12/12/14  8:38 PM  Result Value Ref Range   Sodium 138 135 - 145 mmol/L   Potassium 3.0 (L) 3.5 - 5.1 mmol/L   Chloride 101 101 - 111 mmol/L   CO2 27 22 - 32 mmol/L   Glucose, Bld 107 (H) 65 - 99 mg/dL   BUN 21 (H) 6 - 20 mg/dL   Creatinine, Ser 0.76 0.44 - 1.00 mg/dL   Calcium 9.8 8.9 - 10.3 mg/dL   Total Protein 8.7 (H) 6.5 - 8.1 g/dL   Albumin 4.6 3.5 - 5.0 g/dL   AST 21 15 - 41 U/L   ALT 21 14 - 54 U/L   Alkaline Phosphatase 59 38 - 126 U/L   Total Bilirubin 0.6 0.3 - 1.2 mg/dL   GFR calc non Af Amer >60 >60 mL/min   GFR calc Af Amer >60 >60 mL/min    Comment: (NOTE) The eGFR has been calculated using the CKD EPI equation. This calculation has not been validated in all clinical situations. eGFR's persistently <60 mL/min signify  possible Chronic Kidney Disease.    Anion gap 10 5 - 15  Acetaminophen level     Status: Abnormal   Collection Time: 12/12/14  8:38 PM  Result Value Ref Range   Acetaminophen (Tylenol), Serum <10 (L) 10 - 30 ug/mL    Comment:        THERAPEUTIC CONCENTRATIONS VARY SIGNIFICANTLY. A RANGE OF 10-30 ug/mL MAY BE AN EFFECTIVE CONCENTRATION FOR MANY PATIENTS. HOWEVER, SOME ARE BEST TREATED AT CONCENTRATIONS OUTSIDE THIS RANGE. ACETAMINOPHEN CONCENTRATIONS >150 ug/mL AT 4 HOURS AFTER INGESTION AND >50 ug/mL AT 12 HOURS AFTER INGESTION ARE OFTEN ASSOCIATED WITH TOXIC REACTIONS.   Salicylate level     Status: None   Collection Time: 12/12/14  8:38 PM  Result Value Ref Range   Salicylate Lvl <4.6 2.8 - 30.0 mg/dL  I-stat troponin, ED (not at [facility], [facility])     Status: None   Collection Time: 12/12/14  8:53 PM  Result Value Ref Range   Troponin i, poc 0.00 0.00 - 0.08 ng/mL   Comment 3            Comment: Due to the release kinetics of cTnI, a negative result within the first hours of the onset of symptoms does not rule out myocardial infarction with certainty. If myocardial infarction is still suspected, repeat the test at appropriate intervals.   I-Stat Chem 8, ED  (not at [facility])     Status: Abnormal   Collection Time: 12/12/14  8:55 PM  Result Value Ref Range   Sodium 141 135 - 145 mmol/L   Potassium 3.0 (L) 3.5 - 5.1 mmol/L   Chloride 100 (L) 101 - 111 mmol/L   BUN 20 6 - 20 mg/dL   Creatinine, Ser 0.80 0.44 - 1.00 mg/dL   Glucose, Bld 108 (H) 65 -  99 mg/dL   Calcium, Ion 1.23 1.12 - 1.23 mmol/L   TCO2 23 0 - 100 mmol/L   Hemoglobin 12.9 12.0 - 15.0 g/dL   HCT 38.0 36.0 - 46.0 %  Urine rapid drug screen (hosp performed)not at [facility]     Status: Abnormal   Collection Time: 12/12/14  9:27 PM  Result Value Ref Range   Opiates POSITIVE (A) NONE DETECTED   Cocaine NONE DETECTED NONE DETECTED   Benzodiazepines NONE DETECTED NONE DETECTED   Amphetamines NONE DETECTED NONE  DETECTED   Tetrahydrocannabinol NONE DETECTED NONE DETECTED   Barbiturates NONE DETECTED NONE DETECTED    Comment:        DRUG SCREEN FOR MEDICAL PURPOSES ONLY.  IF CONFIRMATION IS NEEDED FOR ANY PURPOSE, NOTIFY LAB WITHIN 5 DAYS.        LOWEST DETECTABLE LIMITS FOR URINE DRUG SCREEN Drug Class       Cutoff (ng/mL) Amphetamine      1000 Barbiturate      200 Benzodiazepine   016 Tricyclics       010 Opiates          300 Cocaine          300 THC              50   Urinalysis, Routine w reflex microscopic (not at [facility])     Status: Abnormal   Collection Time: 12/12/14  9:27 PM  Result Value Ref Range   Color, Urine YELLOW YELLOW   APPearance CLEAR CLEAR   Specific Gravity, Urine 1.010 1.005 - 1.030   pH 7.5 5.0 - 8.0   Glucose, UA NEGATIVE NEGATIVE mg/dL   Hgb urine dipstick TRACE (A) NEGATIVE   Bilirubin Urine NEGATIVE NEGATIVE   Ketones, ur TRACE (A) NEGATIVE mg/dL   Protein, ur NEGATIVE NEGATIVE mg/dL   Urobilinogen, UA 0.2 0.0 - 1.0 mg/dL   Nitrite NEGATIVE NEGATIVE   Leukocytes, UA NEGATIVE NEGATIVE  Urine microscopic-add on     Status: None   Collection Time: 12/12/14  9:27 PM  Result Value Ref Range   Squamous Epithelial / LPF RARE RARE   RBC / HPF 0-2 <3 RBC/hpf  CBG monitoring, ED     Status: None   Collection Time: 12/12/14  9:37 PM  Result Value Ref Range   Glucose-Capillary 99 65 - 99 mg/dL  Glucose, capillary     Status: Abnormal   Collection Time: 12/13/14 12:14 AM  Result Value Ref Range   Glucose-Capillary 103 (H) 65 - 99 mg/dL  Urine rapid drug screen (hosp performed)not at [facility]     Status: Abnormal   Collection Time: 12/13/14  3:15 AM  Result Value Ref Range   Opiates POSITIVE (A) NONE DETECTED   Cocaine NONE DETECTED NONE DETECTED   Benzodiazepines NONE DETECTED NONE DETECTED   Amphetamines NONE DETECTED NONE DETECTED   Tetrahydrocannabinol NONE DETECTED NONE DETECTED   Barbiturates NONE DETECTED NONE DETECTED    Comment:        DRUG SCREEN FOR  MEDICAL PURPOSES ONLY.  IF CONFIRMATION IS NEEDED FOR ANY PURPOSE, NOTIFY LAB WITHIN 5 DAYS.        LOWEST DETECTABLE LIMITS FOR URINE DRUG SCREEN Drug Class       Cutoff (ng/mL) Amphetamine      1000 Barbiturate      200 Benzodiazepine   932 Tricyclics       355 Opiates          300 Cocaine  300 THC              50   Fasting lipid panel     Status: None   Collection Time: 12/13/14  6:13 AM  Result Value Ref Range   Cholesterol 155 0 - 200 mg/dL   Triglycerides 49 <150 mg/dL   HDL 53 >40 mg/dL   Total CHOL/HDL Ratio 2.9 RATIO   VLDL 10 0 - 40 mg/dL   LDL Cholesterol 92 0 - 99 mg/dL    Comment:        Total Cholesterol/HDL:CHD Risk Coronary Heart Disease Risk Table                     Men   Women  1/2 Average Risk   3.4   3.3  Average Risk       5.0   4.4  2 X Average Risk   9.6   7.1  3 X Average Risk  23.4   11.0        Use the calculated Patient Ratio above and the CHD Risk Table to determine the patient's CHD Risk.        ATP III CLASSIFICATION (LDL):  <100     mg/dL   Optimal  100-129  mg/dL   Near or Above                    Optimal  130-159  mg/dL   Borderline  160-189  mg/dL   High  >190     mg/dL   Very High   Magnesium     Status: None   Collection Time: 12/13/14  6:13 AM  Result Value Ref Range   Magnesium 1.8 1.7 - 2.4 mg/dL  Glucose, capillary     Status: Abnormal   Collection Time: 12/13/14 11:56 AM  Result Value Ref Range   Glucose-Capillary 108 (H) 65 - 99 mg/dL  Glucose, capillary     Status: Abnormal   Collection Time: 12/13/14  4:36 PM  Result Value Ref Range   Glucose-Capillary 118 (H) 65 - 99 mg/dL    Studies/Results:  BRAIN MRI The examination had to be discontinued prior to completion due to patient claustrophobia. Diffusion weighted imaging, sagittal T1 weighted imaging, and axial gradient echo imaging was performed.  Diffusion-weighted imaging is within normal limits. No restricted diffusion to suggest acute  infarction.  Cerebral volume is normal. No midline shift, mass effect, or evidence of intracranial mass lesion. No ventriculomegaly. Two it area, cervicomedullary junction, and visualized cervical spine appear unremarkable. No encephalomalacia identified.  IMPRESSION: 1. No evidence of acute infarct. 2. The examination had to be discontinued prior to completion.  BRAIN MRA NORMAL   CAROTID DOPPLERS normal     Bunny Lowdermilk A. Merlene Laughter, M.D.  Diplomate, Tax adviser of Psychiatry and Neurology ( Neurology). 12/13/2014, 7:39 PM

## 2014-12-13 NOTE — Progress Notes (Signed)
SLP Cancellation Note  Patient Details Name: Aika Brzoska MRN: [id number] DOB: Apr 06, 1971   Cancelled treatment:       Reason Eval/Treat Not Completed: Patient at procedure or test/unavailable. ST to follow up tomorrow   [place], [place], [place] 12/13/2014, 4:51 PM

## 2014-12-13 NOTE — Progress Notes (Signed)
  Echocardiogram 2D Echocardiogram has been performed.  Brittany Durham 12/13/2014, 4:46 PM

## 2014-12-13 NOTE — Care Management Note (Signed)
Case Management Note  Patient Details  Name: Brittany Durham MRN: [id number] Date of Birth: 15-Jan-1971  Subjective/Objective:                  Pt admitted from home with syncopal episode vs seizure. Pt lives alone and will return home at discharge. Pt is fairly independent with ADL's.  Action/Plan: No CM needs noted.  Expected Discharge Date:   12/14/14               Expected Discharge Plan:  Home/Self Care  In-House Referral:  NA  Discharge planning Services  CM Consult  Post Acute Care Choice:  NA Choice offered to:  NA  DME Arranged:    DME Agency:     HH Arranged:    HH Agency:     Status of Service:  Completed, signed off  Medicare Important Message Given:    Date Medicare IM Given:    Medicare IM give by:    Date Additional Medicare IM Given:    Additional Medicare Important Message give by:     If discussed at [place] of Stay Meetings, dates discussed:    Additional Comments:  Joylene Draft, RN 12/13/2014, 11:34 AM

## 2014-12-13 NOTE — Progress Notes (Signed)
[facility] PROGRESS NOTE  Maizey Menendez AST:[id number] DOB: [birth year] DOA: 12/12/2014 PCP: Sharion Balloon, FNP  Assessment/Plan: Syncope seizure Patient has a history of seizure disorder, and one episode of seizure one year ago and none since. May be related to vasovagal syncope attack related to extreme pain in post operative knee and hyperventilation. No further episodes. Await  EEG and  Echocardiogram.   ? TIA Tele neurology was consulted by the ED physician, and he recommended the patient to be observed overnight for possible TIA and workup. MRI/MRA brain normal. Await results of echocardiogram, carotid Doppler. Has demonstrated some stuttering and stammering today. Passed the bedside swallow evaluation. Denies discomfort with swallowing. Patient has no neurological deficit, but is stammering/stuttering. Will request speech therapy evaluation  Hypertension Patient's blood pressure was elevated when she came to the hospital. Initially blood pressure medications held. Will resume home amlodipine and lisinopril and HCTZ. monitor  Hypokalemia repleted with little improvement. Magnesium level 1.8. Likely related to HCTZ. Will replete again and recheck.   Recent knee surgery Continues with pain. Will discontinue IV pain med. Request PT evaluation   Code Status: full Family Communication: none present Disposition Plan: home hopefully tomorrow   Consultants:  none  Procedures:    Antibiotics:  none  HPI/Subjective: Sitting up in bed watching TV. Denies pain/discomfort.   Objective: Filed Vitals:   12/13/14 1200  BP: 144/65  Pulse: 108  Temp: 97.7 F (36.5 C)  Resp: 20    Intake/Output Summary (Last 24 hours) at 12/13/14 1522 Last data filed at 12/13/14 1450  Gross per 24 hour  Intake 1186.67 ml  Output   1800 ml  Net -613.33 ml   Filed Weights   12/12/14 2032 12/12/14 2305  Weight: 83.915 kg (185 lb) 86.1 kg (189 lb 13.1 oz)    Exam:   General:   Appears comfortable.  Cardiovascular: RRR no MGR no LE edema  Respiratory: normal effort BS clear bilaterally no wheeze  Abdomen: non-distended non-tender +BS  Musculoskeletal: left knee with small swelling no erythema. Arthroscopy sites clean and dry  Neuro: bilateral grip 5/5, bilateral LE strength  5/5. No pronator drift, speech with stuttering and stammering but clear.    Data Reviewed: Basic Metabolic Panel:  Recent Labs Lab 12/12/14 2038 12/12/14 2055 12/13/14 0613  NA 138 141  --   K 3.0* 3.0*  --   CL 101 100*  --   CO2 27  --   --   GLUCOSE 107* 108*  --   BUN 21* 20  --   CREATININE 0.76 0.80  --   CALCIUM 9.8  --   --   MG  --   --  1.8   Liver Function Tests:  Recent Labs Lab 12/12/14 2038  AST 21  ALT 21  ALKPHOS 59  BILITOT 0.6  PROT 8.7*  ALBUMIN 4.6   No results for input(s): LIPASE, AMYLASE in the last 168 hours. No results for input(s): AMMONIA in the last 168 hours. CBC:  Recent Labs Lab 12/12/14 2038 12/12/14 2055  WBC 8.9  --   NEUTROABS 5.3  --   HGB 12.5 12.9  HCT 36.8 38.0  MCV 90.2  --   PLT 230  --    Cardiac Enzymes: No results for input(s): CKTOTAL, CKMB, CKMBINDEX, TROPONINI in the last 168 hours. BNP (last 3 results) No results for input(s): BNP in the last 8760 hours.  ProBNP (last 3 results) No results for input(s): PROBNP in the last  8760 hours.  CBG:  Recent Labs Lab 12/12/14 2137 12/13/14 0014 12/13/14 1156  GLUCAP 99 103* 108*    No results found for this or any previous visit (from the past 240 hour(s)).   Studies: Ct Head Wo Contrast  12/12/2014   CLINICAL DATA:  Initial evaluation for acute altered mental status.  EXAM: CT HEAD WITHOUT CONTRAST  TECHNIQUE: Contiguous axial images were obtained from the base of the skull through the vertex without intravenous contrast.  COMPARISON:  Prior study from 11/01/2011  FINDINGS: There is no acute intracranial hemorrhage or infarct. No mass lesion or midline  shift. Gray-white matter differentiation is well maintained. Ventricles are normal in size without evidence of hydrocephalus. CSF containing spaces are within normal limits. No extra-axial fluid collection.  The calvarium is intact.  Orbital soft tissues are within normal limits.  The paranasal sinuses and mastoid air cells are well pneumatized and free of fluid.  Scalp soft tissues are unremarkable.  IMPRESSION: Normal head CT with no acute intracranial process identified.   Electronically Signed   By: Jeannine Boga M.D.   On: 12/12/2014 21:20   Mr Jodene Nam Head Wo Contrast  12/13/2014   CLINICAL DATA:  Dysarthria.  EXAM: MRA HEAD WITHOUT CONTRAST  TECHNIQUE: Angiographic images of the Circle of Willis were obtained using MRA technique without intravenous contrast.  COMPARISON:  MR RI head 12/13/2014  FINDINGS: Both vertebral arteries patent to the basilar without stenosis. PICA patent bilaterally. Basilar is tortuous but widely patent. Posterior cerebral arteries are normal bilaterally  Internal carotid artery is normal bilaterally. Anterior and middle cerebral arteries are normal bilaterally.  Negative for cerebral aneurysm.  IMPRESSION: Normal MRA head.   Electronically Signed   By: Franchot Gallo M.D.   On: 12/13/2014 11:52   Mri Brain Without Contrast  12/13/2014   CLINICAL DATA:  44 year old female with syncopal episode and altered mental status. Personal history of seizures. Initial encounter.  EXAM: MRI HEAD WITHOUT CONTRAST  TECHNIQUE: Multiplanar, multiecho pulse sequences of the brain and surrounding structures were obtained without intravenous contrast.  COMPARISON:  Noncontrast head CT 12/12/2014, and earlier  FINDINGS: The examination had to be discontinued prior to completion due to patient claustrophobia. Diffusion weighted imaging, sagittal T1 weighted imaging, and axial gradient echo imaging was performed.  Diffusion-weighted imaging is within normal limits. No restricted diffusion to  suggest acute infarction.  Cerebral volume is normal. No midline shift, mass effect, or evidence of intracranial mass lesion. No ventriculomegaly. Two it area, cervicomedullary junction, and visualized cervical spine appear unremarkable. No encephalomalacia identified.  IMPRESSION: 1. No evidence of acute infarct. 2.  The examination had to be discontinued prior to completion.   Electronically Signed   By: Genevie Ann M.D.   On: 12/13/2014 09:02    Scheduled Meds: . [START ON 12/14/2014] aspirin  325 mg Oral Daily  . enoxaparin (LOVENOX) injection  40 mg Subcutaneous Q24H  . potassium chloride  40 mEq Oral Q4H   Continuous Infusions:   Principal Problem:   Syncope Active Problems:   HYPERTENSION, BENIGN   GERD (gastroesophageal reflux disease)   Vitamin D deficiency   Altered mental status   Seizures    Time spent: 35 minutes    [facility] Pager [phone]. If 7PM-7AM, please contact night-coverage at [email or web address], password [facility] 12/13/2014, 3:22 PM  LOS: 1 day

## 2014-12-14 ENCOUNTER — Other Ambulatory Visit (HOSPITAL_COMMUNITY): Payer: 59

## 2014-12-14 ENCOUNTER — Encounter (HOSPITAL_COMMUNITY): Payer: Self-pay | Admitting: Internal Medicine

## 2014-12-14 DIAGNOSIS — E041 Nontoxic single thyroid nodule: Secondary | ICD-10-CM | POA: Diagnosis present

## 2014-12-14 DIAGNOSIS — I10 Essential (primary) hypertension: Secondary | ICD-10-CM

## 2014-12-14 DIAGNOSIS — R55 Syncope and collapse: Secondary | ICD-10-CM | POA: Diagnosis not present

## 2014-12-14 DIAGNOSIS — M25562 Pain in left knee: Secondary | ICD-10-CM | POA: Diagnosis present

## 2014-12-14 DIAGNOSIS — K219 Gastro-esophageal reflux disease without esophagitis: Secondary | ICD-10-CM

## 2014-12-14 LAB — BASIC METABOLIC PANEL
Anion gap: 7 (ref 5–15)
BUN: 16 mg/dL (ref 6–20)
CO2: 26 mmol/L (ref 22–32)
Calcium: 9.7 mg/dL (ref 8.9–10.3)
Chloride: 104 mmol/L (ref 101–111)
Creatinine, Ser: 0.59 mg/dL (ref 0.44–1.00)
GFR calc Af Amer: >60 mL/min (ref >60)
GFR calc non Af Amer: >60 mL/min (ref >60)
Glucose, Bld: 96 mg/dL (ref 65–99)
Potassium: 3.9 mmol/L (ref 3.5–5.1)
Sodium: 137 mmol/L (ref 135–145)

## 2014-12-14 LAB — HEMOGLOBIN A1C
Hgb A1c MFr Bld: 5.1 % (ref 4.8–5.6)
Mean Plasma Glucose: 100 mg/dL

## 2014-12-14 LAB — GLUCOSE, CAPILLARY
Glucose-Capillary: 98 mg/dL (ref 65–99)
Glucose-Capillary: 94 mg/dL (ref 65–99)

## 2014-12-14 LAB — TSH: TSH: 1.28 u[IU]/mL (ref 0.350–4.500)

## 2014-12-14 MED ORDER — LISINOPRIL 10 MG PO TABS
20.0000 mg | ORAL_TABLET | Freq: Every day | ORAL | Status: DC
  Administered 2014-12-14: 20 mg via ORAL
  Filled 2014-12-14: qty 2

## 2014-12-14 NOTE — Care Management Note (Signed)
Case Management Note  Patient Details  Name: Brittany Durham MRN: [id number] Date of Birth: 18-Jun-1970  Subjective/Objective:                    Action/Plan:   Expected Discharge Date:                  Expected Discharge Plan:  Home/Self Care  In-House Referral:  NA  Discharge planning Services  CM Consult  Post Acute Care Choice:  NA Choice offered to:  NA  DME Arranged:    DME Agency:     HH Arranged:    [place] Agency:     Status of Service:  Completed, signed off  Medicare Important Message Given:    Date Medicare IM Given:    Medicare IM give by:    Date Additional Medicare IM Given:    Additional Medicare Important Message give by:     If discussed at [place] of Stay Meetings, dates discussed:    Additional Comments: Pt discharged home today. Staff will arrange outpt EEG appt. Pts daughter has already arranged pts outpt PT and pts appt is Tuesday, August 9. No other CM needs noted. Christinia Gully [place], RN 12/14/2014, 10:43 AM

## 2014-12-14 NOTE — Discharge Summary (Signed)
Physician Discharge Summary  Brittany Durham BJY:[id number] DOB: 22-Jul-1970 DOA: 12/12/2014  PCP: Brittany Spencer, FNP  Admit date: 12/12/2014 Discharge date: 12/14/2014  Time spent: 40 minutes  Recommendations for Outpatient Follow-up:  1. Follow up with PCP 1-2 weeks for evaluation of stammering and possible referral to pshychiatry and work up of thyroid nodule 2. Follow up with ortho as scheduled 3. Follow up with Dr Ronal Fear office for results of EEG  Discharge Diagnoses:  Principal Problem:   Syncope Active Problems:   HYPERTENSION, BENIGN   GERD (gastroesophageal reflux disease)   Vitamin D deficiency   Altered mental status   Seizures   Thyroid nodule   Left knee pain   Discharge Condition: stable  Diet recommendation: heart healthy  Filed Weights   12/12/14 2032 12/12/14 2305  Weight: 83.915 kg (185 lb) 86.1 kg (189 lb 13.1 oz)    History of present illness:  44 year old female whopast medical history of Hypertension and Seizures. was brought to the emergency department on 12/12/14 after patient had episode of syncope. As per patient's daughter who witnessed the episode, patient had left knee surgery 2 days prior and was experiencing pain in the left knee. As she began unwrapping the dressing of the knee patient started hyperventilating and then passed out. As per daughter patient was out for couple of minutes and then had a blank stare in her eyes. There was normal witnessed seizure-like activity, no biting of tongue no loss of bladder or bowel control. Patient denied any headache no chest pain shortness of breath. No blurred vision. Patient was seen by teleneurology and did recommended to keep the patient in the hospital for observation for possible seizure or syncope versus TIA. Patient has a history of seizures in the past, as per daughter patient had one episode of seizure one year ago. She  not on any anti-epileptic medications. CT head  negative for CVA in the ED  . Patient did have a slurred speech  Hospital Course:  Syncope seizure Patient with history of seizure disorder, and one episode of seizure one year ago and none since. Evaluated by neurology who opine related vasovagal syncope attack related to extreme pain in post operative knee and hyperventilation. No further episodes during hospitalization. No antepileptic meds recommended.  EEG OP. Will follow up with Dr Jerre Simon for results EEG.    ? TIA Tele neurology was consulted by the ED physician, and he recommended the patient to be observed overnight for possible TIA and workup. MRI/MRA brain normal. Echocardiogram wall thickness wasincreased in a pattern of mild LVH. Systolic function was normal.The estimated ejection fraction was in the range of 60% to 65%.Wall motion was normal; there were no regional wall motionabnormalities. carotid Doppler without significant stenosis. Demonstrated some stuttering and stammering thought to be psychosomatic per neruology. Passed the bedside swallow evaluation.   Hypertension Patient's blood pressure was elevated when she came to the hospital. Controlled at discharge. Will resume home meds  Hypokalemia repleted and resolved at discharge.   Recent knee surgery Continues with pain. Evaluated by PT who recommend OP PT. To be follow by otho  Tachycardia Sinus with HR 120's with ambulation. No chest pain no sob. EKG with ST at 119.  Related to pain. At rest HR 90's. Thyroid nodule found on carotid doppler. Will check TSH. Follow OP  Thyroid nodule TSH   Procedures:  Left ventricle: The cavity size was normal. Wall thickness was increased in a pattern of mild LVH. Systolic function was normal.  The estimated ejection fraction was in the range of 60% to 65%. Wall motion was normal; there were no regional wall motion abnormalities. (i.e. Studies not automatically included, echos, thoracentesis, etc; not  x-rays)  Consultations:  neurology  Discharge Exam: Filed Vitals:   12/14/14 0400  BP: 156/85  Pulse: 100  Temp: 98.5 F (36.9 C)  Resp:     General: ambulating in hall appears comfortable Cardiovascular: RRR no MGR No LE edema  Respiratory: normal effort BS clear bilaterally no wheeze Neuro: alert oriented x3. Speech with stammer/stutter.   Discharge Instructions    Current Discharge Medication List    CONTINUE these medications which have NOT CHANGED   Details  amLODipine (NORVASC) 10 MG tablet Take 1 tablet (10 mg total) by mouth daily. Qty: 90 tablet, Refills: 3   Associated Diagnoses: Essential hypertension, benign    HYDROcodone-acetaminophen (NORCO) 7.5-325 MG per tablet Take 1 tablet by mouth every 4 (four) hours as needed for moderate pain. Qty: 120 tablet, Refills: 0    lisinopril-hydrochlorothiazide (ZESTORETIC) 20-12.5 MG per tablet Take 2 tablets by mouth daily. Qty: 180 tablet, Refills: 4   Associated Diagnoses: Essential hypertension, benign    ibuprofen (ADVIL,MOTRIN) 200 MG tablet Take 400 mg by mouth every 6 (six) hours as needed for moderate pain.    Vitamin D, Ergocalciferol, (DRISDOL) 50000 UNITS CAPS capsule Take 1 capsule (50,000 Units total) by mouth every 7 (seven) days. Qty: 12 capsule, Refills: 3       No Known Allergies Follow-up Information    Follow up with Brittany Spencer, FNP In 2 weeks.   Specialty:  Nurse Practitioner   Why:  evaluation of stuttering and possible referral to psychiatry   Contact information:   [address] [place] [address] [phone]        The results of significant diagnostics from this hospitalization (including imaging, microbiology, ancillary and laboratory) are listed below for reference.    Significant Diagnostic Studies: Ct Head Wo Contrast  12/12/2014   CLINICAL DATA:  Initial evaluation for acute altered mental status.  EXAM: CT HEAD WITHOUT CONTRAST  TECHNIQUE: Contiguous axial  images were obtained from the base of the skull through the vertex without intravenous contrast.  COMPARISON:  Prior study from 11/01/2011  FINDINGS: There is no acute intracranial hemorrhage or infarct. No mass lesion or midline shift. Gray-white matter differentiation is well maintained. Ventricles are normal in size without evidence of hydrocephalus. CSF containing spaces are within normal limits. No extra-axial fluid collection.  The calvarium is intact.  Orbital soft tissues are within normal limits.  The paranasal sinuses and mastoid air cells are well pneumatized and free of fluid.  Scalp soft tissues are unremarkable.  IMPRESSION: Normal head CT with no acute intracranial process identified.   Electronically Signed   By: Rise Mu M.D.   On: 12/12/2014 21:20   Mr Maxine Glenn Head Wo Contrast  12/13/2014   CLINICAL DATA:  Dysarthria.  EXAM: MRA HEAD WITHOUT CONTRAST  TECHNIQUE: Angiographic images of the Circle of Willis were obtained using MRA technique without intravenous contrast.  COMPARISON:  MR RI head 12/13/2014  FINDINGS: Both vertebral arteries patent to the basilar without stenosis. PICA patent bilaterally. Basilar is tortuous but widely patent. Posterior cerebral arteries are normal bilaterally  Internal carotid artery is normal bilaterally. Anterior and middle cerebral arteries are normal bilaterally.  Negative for cerebral aneurysm.  IMPRESSION: Normal MRA head.   Electronically Signed   By: Marlan Palau M.D.  On: 12/13/2014 11:52   Mri Brain Without Contrast  12/13/2014   CLINICAL DATA:  44 year old female with syncopal episode and altered mental status. Personal history of seizures. Initial encounter.  EXAM: MRI HEAD WITHOUT CONTRAST  TECHNIQUE: Multiplanar, multiecho pulse sequences of the brain and surrounding structures were obtained without intravenous contrast.  COMPARISON:  Noncontrast head CT 12/12/2014, and earlier  FINDINGS: The examination had to be discontinued prior to  completion due to patient claustrophobia. Diffusion weighted imaging, sagittal T1 weighted imaging, and axial gradient echo imaging was performed.  Diffusion-weighted imaging is within normal limits. No restricted diffusion to suggest acute infarction.  Cerebral volume is normal. No midline shift, mass effect, or evidence of intracranial mass lesion. No ventriculomegaly. Two it area, cervicomedullary junction, and visualized cervical spine appear unremarkable. No encephalomalacia identified.  IMPRESSION: 1. No evidence of acute infarct. 2.  The examination had to be discontinued prior to completion.   Electronically Signed   By: Odessa Fleming M.D.   On: 12/13/2014 09:02   US Carotid Bilateral  12/13/2014   CLINICAL DATA:  Stroke.  EXAM: BILATERAL CAROTID DUPLEX ULTRASOUND  TECHNIQUE: Wallace Cullens scale imaging, color Doppler and duplex ultrasound were performed of bilateral carotid and vertebral arteries in the neck.  COMPARISON:  None.  FINDINGS: Criteria: Quantification of carotid stenosis is based on velocity parameters that correlate the residual internal carotid diameter with NASCET-based stenosis levels, using the diameter of the distal internal carotid lumen as the denominator for stenosis measurement.  The following velocity measurements were obtained:  RIGHT  ICA:  89/26 cm/sec  CCA:  101/ 8 cm/sec  SYSTOLIC ICA/CCA RATIO:  0.9  DIASTOLIC ICA/CCA RATIO:  3.5  ECA:  117 cm/sec  LEFT  ICA:  81/33 cm/sec  CCA:  132/24 cm/sec  SYSTOLIC ICA/CCA RATIO:  0.6  DIASTOLIC ICA/CCA RATIO:  1.4  ECA:  77 cm/sec  RIGHT CAROTID ARTERY: Mild right bifurcation plaque. No flow limiting stenosis.  RIGHT VERTEBRAL ARTERY:  Patent with antegrade flow.  LEFT CAROTID ARTERY: Mild left bifurcation plaque . No flow limiting stenosis.  LEFT VERTEBRAL ARTERY:  Patent antegrade flow.  Incidental note is made of a 2.1 cm right thyroid lobe solid nodule. Dedicated thyroid ultrasound suggested for further evaluation.  IMPRESSION: 1. Mild bilateral  carotid bifurcation plaque. No flow limiting stenosis. Degree of stenosis less than 50%. 2. Incidental is made of a 2.1 cm right thyroid lobe solid nodule. Dedicated thyroid ultrasound suggested for further evaluation.   Electronically Signed   By: Maisie Fus  Register   On: 12/13/2014 15:45    Microbiology: No results found for this or any previous visit (from the past 240 hour(s)).   Labs: Basic Metabolic Panel:  Recent Labs Lab 12/12/14 2038 12/12/14 2055 12/13/14 0613  NA 138 141  --   K 3.0* 3.0*  --   CL 101 100*  --   CO2 27  --   --   GLUCOSE 107* 108*  --   BUN 21* 20  --   CREATININE 0.76 0.80  --   CALCIUM 9.8  --   --   MG  --   --  1.8   Liver Function Tests:  Recent Labs Lab 12/12/14 2038  AST 21  ALT 21  ALKPHOS 59  BILITOT 0.6  PROT 8.7*  ALBUMIN 4.6   No results for input(s): LIPASE, AMYLASE in the last 168 hours. No results for input(s): AMMONIA in the last 168 hours. CBC:  Recent Labs Lab 12/12/14  2038 12/12/14 2055  WBC 8.9  --   NEUTROABS 5.3  --   HGB 12.5 12.9  HCT 36.8 38.0  MCV 90.2  --   PLT 230  --    Cardiac Enzymes: No results for input(s): CKTOTAL, CKMB, CKMBINDEX, TROPONINI in the last 168 hours. BNP: BNP (last 3 results) No results for input(s): BNP in the last 8760 hours.  ProBNP (last 3 results) No results for input(s): PROBNP in the last 8760 hours.  CBG:  Recent Labs Lab 12/13/14 0014 12/13/14 1156 12/13/14 1636 12/13/14 2106 12/14/14 0745  GLUCAP 103* 108* 118* 127* 98       Signed:  Quaniyah Bugh M  [facility] 12/14/2014, 10:00 AM

## 2014-12-14 NOTE — Evaluation (Signed)
Physical Therapy Evaluation Patient Details Name: Brittany Durham MRN: [id number] DOB: April 11, 1971 Today's Date: 12/14/2014   History of Present Illness  The patient is a 44 year old white female who underwent left orthoscopic knee surgery for torn ligament. This was about a few days ago. The patient was at home resting when she was taking care of the knee after surgery. He requires use of peroxide to the wound site. She was getting this done by her daughter when she developed severe intense pain to the point of irritation tented up and crying. The daughter reported the patient passed out momentarily. She became unresponsive with eyes closed. This probably lasted for several seconds to a couple minutes. The patient went on to have another spell later on in fact had a total of about 5 events. The events were relatively similar with the patient tends not and stiffening becoming unresponsive with eyes closed. At one time however, she did have her eyes open and was unresponsive. No clonic activities are reported. No oral trauma or urinary incontinence. No bowel incontinence. No chest pain or shortness of breath. No GI GU symptoms. The patient became dysarthric with slurring of her speech and stammering after the events. The patient has been worked up rather extensively with only accelerated hypertension noted but otherwise imaging and other tests have been unrevealing. The daughter tells me that the patient has had a similar back a spell in the past a few months ago. She also had one about a few years ago. She apparently was worked up for one of these events 3 years ago and this was unrevealing. The patient is at baseline but has residual difficulty with her speech. Review of systems otherwise negative.  Clinical Impression   Pt was seen for evaluation/tx.  She was alert and cooperative, no pain at rest.  She continues to have a severe stutter that did not lessen during my visit.  Her left knee has no visible edema  with 0-120 degrees ROM, AA.  We initiated therapeutic exercise for gentle ROM/strengthening.  Pt was extremely nervous and cried as we did quad sets (a very easy and non-stressful exercise).  She acknowledged that she had no pain with the exercise and I was able to calm her down as she continued.  She had no difficulty with other exercise.  She ambulated well with a walker, slow gait.  She uses crutches at home but she did not have them here.  Of concern is that her HR during gait was in the high 120s to low 130s.  She was asymptomatic during gait.  She was instructed in developing improved left knee flexion during swing through phase of gait.  She plans to return home with her Mother at d/c.    Follow Up Recommendations Outpatient PT    Equipment Recommendations  None recommended by PT    Recommendations for Other Services   none    Precautions / Restrictions Precautions Precautions: None Restrictions Weight Bearing Restrictions: No      Mobility  Bed Mobility Overal bed mobility: Independent                Transfers Overall transfer level: Independent Equipment used: Rolling walker (2 wheeled)                Ambulation/Gait Ambulation/Gait assistance: Modified independent (Device/Increase time) Ambulation Distance (Feet): 140 Feet Assistive device: Rolling walker (2 wheeled) Gait Pattern/deviations: Decreased dorsiflexion - left;Decreased step length - right   Gait velocity interpretation: Below normal speed  for age/gender                         Balance Overall balance assessment: No apparent balance deficits (not formally assessed)                                           Pertinent Vitals/Pain Pain Assessment: No/denies pain    Home Living Family/patient expects to be discharged to:: Private residence Living Arrangements: Parent Available Help at Discharge: Family;Available 24 hours/day Type of Home: House (Pt is  currently staying with her Mother) Home Access: Stairs to enter Entrance Stairs-Rails: Right Entrance Stairs-Number of Steps: 2 Home Layout: One level Home Equipment: Crutches      Prior Function Level of Independence: Independent                       Extremity/Trunk Assessment               Lower Extremity Assessment: LLE deficits/detail   LLE Deficits / Details: ROM of knee= 0-120 degrees, strength=3/5  Cervical / Trunk Assessment: Normal  Communication   Communication: Expressive difficulties (severe stuttering)  Cognition Arousal/Alertness: Awake/alert Behavior During Therapy: WFL for tasks assessed/performed Overall Cognitive Status: Within Functional Limits for tasks assessed                            Exercises General Exercises - Lower Extremity Ankle Circles/Pumps: AROM;Both;10 reps;Supine Quad Sets: AROM;Both;10 reps;Supine Short Arc Quad: AAROM;AROM;Left;10 reps;Supine Heel Slides: AAROM;Left;10 reps;Supine      Assessment/Plan    PT Assessment Patient needs continued PT services  PT Diagnosis Difficulty walking;Abnormality of gait   PT Problem List Decreased strength;Decreased range of motion;Decreased mobility;Cardiopulmonary status limiting activity  PT Treatment Interventions Gait training;Therapeutic exercise   PT Goals (Current goals can be found in the Care Plan section) Acute Rehab PT Goals Patient Stated Goal: none stated PT Goal Formulation: With patient/family Time For Goal Achievement: 12/21/14 Potential to Achieve Goals: Good    Frequency Min 3X/week   Barriers to discharge   none    Co-evaluation               End of Session Equipment Utilized During Treatment: Gait belt Activity Tolerance: Patient tolerated treatment well Patient left: in bed;with call bell/phone within reach Nurse Communication: Mobility status    Functional Assessment Tool Used: clinical judgement Functional Limitation:  Mobility: Walking and moving around Mobility: Walking and Moving Around Current Status (N5621): At least 1 percent but less than 20 percent impaired, limited or restricted Mobility: Walking and Moving Around Goal Status [phone]): 0 percent impaired, limited or restricted    Time: 7846-9629 PT Time Calculation (min) (ACUTE ONLY): 32 min   Charges:   PT Evaluation $Initial PT Evaluation Tier I: 1 Procedure PT Treatments $Therapeutic Exercise: 8-22 mins   PT G Codes:   PT G-Codes **NOT FOR INPATIENT CLASS** Functional Assessment Tool Used: clinical judgement Functional Limitation: Mobility: Walking and moving around Mobility: Walking and Moving Around Current Status (B2841): At least 1 percent but less than 20 percent impaired, limited or restricted Mobility: Walking and Moving Around Goal Status [phone]): 0 percent impaired, limited or restricted    Sable Feil  PT 12/14/2014, 9:28 AM [phone]

## 2014-12-14 NOTE — Discharge Planning (Signed)
Pt IV and tele removed. Pt discharge papers given, explained and educated. Told of needed FU appts. VSS and RN assessment revealed stability and baseline for discharge.  Outpt EEG set up.  Pt wheeled to car and was taken home via car by family.

## 2014-12-21 ENCOUNTER — Ambulatory Visit: Payer: 59 | Attending: Orthopaedic Surgery | Admitting: Physical Therapy

## 2014-12-21 DIAGNOSIS — M25562 Pain in left knee: Secondary | ICD-10-CM | POA: Diagnosis present

## 2014-12-21 DIAGNOSIS — M25662 Stiffness of left knee, not elsewhere classified: Secondary | ICD-10-CM | POA: Diagnosis present

## 2014-12-21 NOTE — Therapy (Signed)
[facility] [address] [place], [place], [address] Phone: [phone]   Fax:  [phone]  Physical Therapy Evaluation  Patient Details  Name: Brittany Durham MRN: [id number] Date of Birth: 1971/04/08 Referring Provider:  Darreld Mclean, MD  Encounter Date: 12/21/2014      PT End of Session - 12/21/14 1206    Visit Number 1   Number of Visits 12   Date for PT Re-Evaluation 02/01/15   PT Start Time 1034   PT Stop Time 1122   PT Time Calculation (min) 48 min   Behavior During Therapy [facility] for tasks assessed/performed      Past Medical History  Diagnosis Date  . Hypertension   . Seizures   . Thyroid nodule   . Knee pain     left meniscus tear s/p repair 11/2014    Past Surgical History  Procedure Laterality Date  . Tubal ligation    . Wisdom tooth extraction    . Knee arthroscopy with medial menisectomy Left 12/09/2014    Procedure: KNEE ARTHROSCOPY WITH PARTIAL MEDIAL AND LATERAL MENISECTOMY;  Surgeon: Darreld Mclean, MD;  Location: [facility];  Service: Orthopedics;  Laterality: Left;    There were no vitals filed for this visit.  Visit Diagnosis:  Left knee pain - Plan: PT plan of care cert/re-cert  Knee stiffness, left - Plan: PT plan of care cert/re-cert      Subjective Assessment - 12/21/14 1202    Subjective Patient states she looked at her knee after surgery and had a vagal response and passed out and has now been stuttering.   Limitations Walking   Patient Stated Goals Get knee back to normal.   Currently in Pain? Yes   Pain Score 5   With movement.   Pain Location Knee   Pain Orientation Left   Pain Descriptors / Indicators Aching   Pain Type Surgical pain   Pain Frequency Intermittent   Aggravating Factors  Being up.   Pain Relieving Factors Medication and rest.            [facility] PT Assessment - 12/21/14 0001    Assessment   Medical Diagnosis Left knee arthroscopy.   Onset Date/Surgical Date --   February 2016.   Precautions   Precautions None   Restrictions   Weight Bearing Restrictions No   Balance Screen   Has the patient fallen in the past 6 months No   Has the patient had a decrease in activity level because of a fear of falling?  No   Is the patient reluctant to leave their home because of a fear of falling?  No   Home [employer] information [employer] residence   Prior Function   Level of Independence Independent   Cognition   Overall Cognitive Status Within Functional Limits for tasks assessed   Observation/Other Assessments-Edema    Edema Circumferential   Circumferential Edema   Circumferential - Left  Left 2.5 cms > right at apex of patella.   ROM / Strength   AROM / PROM / Strength AROM;Strength   AROM   Overall AROM Comments Full left knee extension and AA into flexion= 93 degrees.   Strength   Overall Strength Comments Decreased volitional contraction of left quadriceps currently and left hip flexion= 2+ to 3-/5.   Palpation   Palpation comment --  Pain complaints primarily "in" knee with flexion.   Ambulation/Gait   Gait Comments Patient presented to the clinic today NWB over  her left LE with bilateral axillary crutches though she can WBAT.                   [facility] Adult PT Treatment/Exercise - 12/21/14 0001    Modalities   Modalities Vasopneumatic   Vasopneumatic   Number Minutes Vasopneumatic  15 minutes   Vasopnuematic Location  --  Left knee.   Vasopneumatic Pressure --  Medium.                     PT Long Term Goals - 12/21/14 1224    PT LONG TERM GOAL #1   Title Ind with HEP.   Time 6   Period Weeks   Status New   PT LONG TERM GOAL #2   Title Active left knee flexion to 115 degrees+ so the patient can perform functional tasks and do so with pain not > 2-3/10.   Time 6   Period Weeks   Status New   PT LONG TERM GOAL #3   Title Increase left  knee strength to a solid 4+/5 to provide good stability for  accomplishment of functional activities   Time 6   Period Weeks   Status New   PT LONG TERM GOAL #4   Title Normal gait pattern without assistive device.   Time 6   Period Weeks   Status New   PT LONG TERM GOAL #5   Title Perform ADL's with pain not > 3/10.   Time 6   Period Weeks   Status New               Plan - 12/21/14 1215    Clinical Impression Statement The patient fell in February 2016 when she slipped in icy steps.  She underwent a left knee arthrocpic surgery on 7/28/6.  She had a vagal response after surgery when looking at her knee and "fainted" and now has a stutter.  Her pain with left knee movement is a 5/10.   Pt will benefit from skilled therapeutic intervention in order to improve on the following deficits Pain;Decreased activity tolerance   Rehab Potential Good   PT Frequency 2x / week   PT Duration 6 weeks   PT Treatment/Interventions ADLs/Self Care Home Management;Cryotherapy;Electrical Stimulation;Gait training;Neuromuscular re-education;Balance training;Therapeutic exercise;Manual techniques;Passive range of motion   PT Next Visit Plan Nustep; ROM; GT: quad sets and SLR's.         Problem List Patient Active Problem List   Diagnosis Date Noted  . Thyroid nodule 12/14/2014  . Left knee pain 12/14/2014  . Seizures   . Altered mental status 12/12/2014  . Syncope 12/12/2014  . Vitamin D deficiency 06/30/2014  . GERD (gastroesophageal reflux disease) 06/29/2014  . SYNCOPE 03/02/2010  . HYPERTENSION, BENIGN 03/01/2010    Dekker Verga, [place] MPT 12/21/2014, 12:34 PM  [facility] [address] [place], [place], [address] Phone: [phone]   Fax:  [phone]

## 2014-12-21 NOTE — Patient Instructions (Signed)
Instructed patient in left knee quad set and SLR.

## 2014-12-23 ENCOUNTER — Ambulatory Visit: Payer: 59 | Admitting: Physical Therapy

## 2014-12-23 DIAGNOSIS — M25662 Stiffness of left knee, not elsewhere classified: Secondary | ICD-10-CM

## 2014-12-23 DIAGNOSIS — M25562 Pain in left knee: Secondary | ICD-10-CM | POA: Diagnosis not present

## 2014-12-23 NOTE — Therapy (Signed)
[facility] [place], [place], [address] Phone: [phone]   Fax:  [phone]  Physical Therapy Treatment  Patient Details  Name: Brittany Durham MRN: [id number] Date of Birth: April 17, 1971 Referring Provider:  Sharion Balloon, FNP  Encounter Date: 12/23/2014      PT End of Session - 12/23/14 0818    Visit Number 2   Number of Visits 12   Date for PT Re-Evaluation 02/01/15   PT Start Time 0816   PT Stop Time 0916   PT Time Calculation (min) 60 min   Activity Tolerance Patient tolerated treatment well      Past Medical History  Diagnosis Date  . Hypertension   . Seizures   . Thyroid nodule   . Knee pain     left meniscus tear s/p repair 11/2014    Past Surgical History  Procedure Laterality Date  . Tubal ligation    . Wisdom tooth extraction    . Knee arthroscopy with medial menisectomy Left 12/09/2014    Procedure: KNEE ARTHROSCOPY WITH PARTIAL MEDIAL AND LATERAL MENISECTOMY;  Surgeon: Sanjuana Kava, MD;  Location: [facility];  Service: Orthopedics;  Laterality: Left;    There were no vitals filed for this visit.  Visit Diagnosis:  Knee stiffness, left  Left knee pain      Subjective Assessment - 12/23/14 0818    Subjective Patient states that she has attempted weightbearing through her LLE since last visit. It feels tight and pain increases a little bit.   Currently in Pain? No/denies                         [facility] Adult PT Treatment/Exercise - 12/23/14 0001    Ambulation/Gait   Ambulation Distance (Feet) 40 Feet   Assistive device Crutches   Ambulation Surface Level   Pre-Gait Activities Weight shifting side to side, toe/heel, tandem stance;   Gait Comments Amb WBAT trying to normalize gait.   Exercises   Exercises Knee/Hip   Knee/Hip Exercises: Aerobic   Nustep L1 x 10 min for ROM and WB   Knee/Hip Exercises: Supine   Short Arc Quad Sets Strengthening;Left;1 set;10 reps  5 sec hold    Heel Slides Strengthening;Left;1 set;10 reps  10 sec hold   Straight Leg Raises Strengthening;Left;10 reps  VCs to hold quad contraction   Knee/Hip Exercises: Sidelying   Hip ABduction Strengthening;Left;10 reps   Hip ADduction Strengthening;Left;1 set;10 reps   Knee/Hip Exercises: Prone   Hip Extension Strengthening;Left;10 reps   Modalities   Modalities Vasopneumatic   Vasopneumatic   Number Minutes Vasopneumatic  15 minutes   Vasopnuematic Location  Knee   Vasopneumatic Pressure Medium   Vasopneumatic Temperature  3*                PT Education - 12/23/14 0907    Education provided Yes   Education Details hep: 4 way SLR; SAQ   Person(s) Educated Patient   Methods Explanation;Demonstration;Handout;Verbal cues   Comprehension Verbalized understanding;Returned demonstration             PT Long Term Goals - 12/21/14 1224    PT LONG TERM GOAL #1   Title Ind with HEP.   Time 6   Period Weeks   Status New   PT LONG TERM GOAL #2   Title Active left knee flexion to 115 degrees+ so the patient can perform functional tasks and do so with pain not > 2-3/10.  Time 6   Period Weeks   Status New   PT LONG TERM GOAL #3   Title Increase left  knee strength to a solid 4+/5 to provide good stability for accomplishment of functional activities   Time 6   Period Weeks   Status New   PT LONG TERM GOAL #4   Title Normal gait pattern without assistive device.   Time 6   Period Weeks   Status New   PT LONG TERM GOAL #5   Title Perform ADL's with pain not > 3/10.   Time 6   Period Weeks   Status New               Plan - 12/23/14 0907    Clinical Impression Statement Patient tolerated treatment well without increased pain. She was able to put PWB through LLE with pre gait and gati activities using parallel bars or crutches. Complained only of tightness.   PT Next Visit Plan Continue Nustep; ROM; GT: quad sets and SLR's.        Problem List Patient  Active Problem List   Diagnosis Date Noted  . Thyroid nodule 12/14/2014  . Left knee pain 12/14/2014  . Seizures   . Altered mental status 12/12/2014  . Syncope 12/12/2014  . Vitamin D deficiency 06/30/2014  . GERD (gastroesophageal reflux disease) 06/29/2014  . SYNCOPE 03/02/2010  . HYPERTENSION, BENIGN 03/01/2010    Madelyn Flavors PT  12/23/2014, 9:11 AM  [facility] [address] [place], [place], [address] Phone: [phone]   Fax:  [phone]

## 2014-12-23 NOTE — Patient Instructions (Addendum)
Perform 1-3 sets of 10 repetitions of each exercise 1x per day.  Hip Flexion / Knee Extension: Straight-Leg Raise (Eccentric)   Lie on back. Lift leg with knee straight. Slowly lower leg.  ABDUCTION: Side-Lying (Active)   Lie on left side, top leg straight. Raise top leg as far as possible. Do not roll bacwards. http://gtsc.exer.us/94   (Home) Extension: Hip   With support under abdomen, tighten stomach. Lift right leg in line with body. Do not bend knee.  ADDUCTION: Side-Lying (Active)   Lie on right side, with top leg bent and in front of other leg. Lift straight leg up as high as possible. Use ___ lbs. Complete ___ sets of ___ repetitions. Perform ___ sessions per day.  http://gtsc.exer.us/129   Copyright  VHI. All rights reserved.  KNEE: Extension, Short Arc Quads - Supine   Place bolster or folded pillow under knees. Raise Left leg until knee is straight. Hold 10 seconds. Do 10 reps per set, 1-3 sets. Do 2 sets per day  Copyright  VHI. All rights reserved.  Madelyn Flavors, PT 12/23/2014 9:05 AM [facility] [address] [place], [place], [address] Phone: [phone]   Fax:  [phone]

## 2014-12-24 ENCOUNTER — Other Ambulatory Visit (HOSPITAL_COMMUNITY): Payer: 59

## 2014-12-24 ENCOUNTER — Ambulatory Visit (HOSPITAL_COMMUNITY)
Admission: RE | Admit: 2014-12-24 | Discharge: 2014-12-24 | Disposition: A | Discharge status: Home / Self Care | Payer: 59 | Source: Ambulatory Visit | Attending: Family | Admitting: Family

## 2014-12-24 DIAGNOSIS — Z79899 Other long term (current) drug therapy: Secondary | ICD-10-CM | POA: Diagnosis not present

## 2014-12-24 DIAGNOSIS — R55 Syncope and collapse: Secondary | ICD-10-CM | POA: Insufficient documentation

## 2014-12-24 NOTE — Progress Notes (Signed)
EEG Completed; Results Pending  

## 2014-12-27 ENCOUNTER — Ambulatory Visit: Payer: 59 | Admitting: Physical Therapy

## 2014-12-27 ENCOUNTER — Encounter: Payer: Self-pay | Admitting: Physical Therapy

## 2014-12-27 DIAGNOSIS — M25662 Stiffness of left knee, not elsewhere classified: Secondary | ICD-10-CM

## 2014-12-27 DIAGNOSIS — M25562 Pain in left knee: Secondary | ICD-10-CM

## 2014-12-27 NOTE — Therapy (Signed)
[facility] [place], [place], [address] Phone: [phone]   Fax:  [phone]  Physical Therapy Treatment  Patient Details  Name: Brittany Durham MRN: [id number] Date of Birth: [birth year] Referring Provider:  Sharion Balloon, FNP  Encounter Date: 12/27/2014      PT End of Session - 12/27/14 1303    Visit Number 3   Number of Visits 12   Date for PT Re-Evaluation 02/01/15   PT Start Time 1301   PT Stop Time 1357   PT Time Calculation (min) 56 min   Activity Tolerance Patient tolerated treatment well   Behavior During Therapy [facility] for tasks assessed/performed      Past Medical History  Diagnosis Date  . Hypertension   . Seizures   . Thyroid nodule   . Knee pain     left meniscus tear s/p repair 11/2014    Past Surgical History  Procedure Laterality Date  . Tubal ligation    . Wisdom tooth extraction    . Knee arthroscopy with medial menisectomy Left 12/09/2014    Procedure: KNEE ARTHROSCOPY WITH PARTIAL MEDIAL AND LATERAL MENISECTOMY;  Surgeon: Sanjuana Kava, MD;  Location: [facility];  Service: Orthopedics;  Laterality: Left;    There were no vitals filed for this visit.  Visit Diagnosis:  Left knee pain  Knee stiffness, left      Subjective Assessment - 12/27/14 1303    Subjective Reports that she is trying to get off of the crutches. Reports a little tightness in L knee today.   Limitations Walking   Patient Stated Goals Get knee back to normal.   Currently in Pain? No/denies            [facility] PT Assessment - 12/27/14 0001    Assessment   Medical Diagnosis Left knee arthroscopy.   Onset Date/Surgical Date 12/09/14   Next MD Visit 01/03/2015                     [facility] Adult PT Treatment/Exercise - 12/27/14 0001    Knee/Hip Exercises: Aerobic   Nustep L5 x10 min   Knee/Hip Exercises: Supine   Short Arc Quad Sets Strengthening;Left;2 sets;10 reps;Other (comment)  5 sec hold   Heel  Slides Strengthening;Left;5 reps  Reported increased pain along the lateral knee   Straight Leg Raises Strengthening;Left;2 sets;10 reps   Knee/Hip Exercises: Sidelying   Hip ABduction Strengthening;Left;10 reps;2 sets   Hip ADduction Strengthening;Left;1 set;10 reps   Knee/Hip Exercises: Prone   Hip Extension Strengthening;Left;2 sets;10 reps   Modalities   Modalities Vasopneumatic   Vasopneumatic   Number Minutes Vasopneumatic  15 minutes   Vasopnuematic Location  Knee   Vasopneumatic Pressure Medium   Vasopneumatic Temperature  34                     PT Long Term Goals - 12/27/14 1316    PT LONG TERM GOAL #1   Title Ind with HEP.   Time 6   Period Weeks   Status Achieved   PT LONG TERM GOAL #2   Title Active left knee flexion to 115 degrees+ so the patient can perform functional tasks and do so with pain not > 2-3/10.   Time 6   Period Weeks   Status On-going   PT LONG TERM GOAL #3   Title Increase left  knee strength to a solid 4+/5 to provide good stability for accomplishment of functional activities  Time 6   Period Weeks   Status On-going   PT LONG TERM GOAL #4   Title Normal gait pattern without assistive device.   Time 6   Period Weeks   Status On-going   PT LONG TERM GOAL #5   Title Perform ADL's with pain not > 3/10.   Time 6   Period Weeks   Status On-going               Plan - 12/27/14 1331    Clinical Impression Statement Patient did fairly well during the treatment today although she had increased pain in the lateral L knee during heel slides. A tennis ball sized pouch was observed over the superiolateral aspect of the L knee. The lump is not painful to touch as was indicated as a spot that painful during heel slides. All other exercises were completed with good technique and no pain was verbalized. Patient ambulated into therapy gym with no assistive device with antalgic gait. A 10 deg extensor lag was measured during supine L SLR  today in clinic. Normal vasopneumatic response noted following removal of the modalities. Denied pain following treatment and was encouraged to continue HEP.   Pt will benefit from skilled therapeutic intervention in order to improve on the following deficits Pain;Decreased activity tolerance   Rehab Potential Good   PT Frequency 2x / week   PT Duration 6 weeks   PT Treatment/Interventions ADLs/Self Care Home Management;Cryotherapy;Electrical Stimulation;Gait training;Neuromuscular re-education;Balance training;Therapeutic exercise;Manual techniques;Passive range of motion   PT Next Visit Plan Continue Nustep; ROM; GT: quad sets and SLR's.   Consulted and Agree with Plan of Care Patient;Family member/caregiver        Problem List Patient Active Problem List   Diagnosis Date Noted  . Thyroid nodule 12/14/2014  . Left knee pain 12/14/2014  . Seizures   . Altered mental status 12/12/2014  . Syncope 12/12/2014  . Vitamin D deficiency 06/30/2014  . GERD (gastroesophageal reflux disease) 06/29/2014  . SYNCOPE 03/02/2010  . HYPERTENSION, BENIGN 03/01/2010    Wynelle Fanny, PTA 12/27/2014, 2:00 PM  [facility] [place], [place], [address] Phone: [phone]   Fax:  [phone]

## 2014-12-31 ENCOUNTER — Ambulatory Visit (INDEPENDENT_AMBULATORY_CARE_PROVIDER_SITE_OTHER): Payer: 59 | Admitting: Family

## 2014-12-31 ENCOUNTER — Ambulatory Visit: Payer: 59 | Admitting: Physical Therapy

## 2014-12-31 ENCOUNTER — Encounter: Payer: Self-pay | Admitting: Family

## 2014-12-31 VITALS — Temp 97.6°F | Ht 67.0 in | Wt 193.6 lb

## 2014-12-31 DIAGNOSIS — R4701 Aphasia: Secondary | ICD-10-CM

## 2014-12-31 DIAGNOSIS — Z09 Encounter for follow-up examination after completed treatment for conditions other than malignant neoplasm: Secondary | ICD-10-CM | POA: Diagnosis not present

## 2014-12-31 DIAGNOSIS — F8081 Childhood onset fluency disorder: Secondary | ICD-10-CM | POA: Diagnosis not present

## 2014-12-31 DIAGNOSIS — Z9889 Other specified postprocedural states: Secondary | ICD-10-CM

## 2014-12-31 NOTE — Progress Notes (Signed)
   Subjective:    Patient ID: Brittany Durham, female    DOB: 03/23/1971, 44 y.o.   MRN: [id number]  HPI Pt presents to the office today for hospital follow up for  Knee arthroscopy with medial menisectomy on 12/09/14. Pt reports feeling good. Pt states she does not have any pain at this time. PT went to the ED on 11/24/14 for a syncope episode and  Possible TIA. CT was negative. PT is still having aphasia with stuttering. PT reports this is better then it had been. Pt denies any dizziness, double vision, gait problems, or weakness.   *I review both of patients hospital encounters.   Review of Systems  Constitutional: Negative.   HENT: Negative.   Eyes: Negative.   Respiratory: Negative.  Negative for shortness of breath.   Cardiovascular: Negative.  Negative for palpitations.  Gastrointestinal: Negative.   Endocrine: Negative.   Genitourinary: Negative.   Musculoskeletal: Negative.   Neurological: Negative.  Negative for headaches.  Hematological: Negative.   Psychiatric/Behavioral: Negative.   All other systems reviewed and are negative.      Objective:   Physical Exam  Constitutional: She is oriented to person, place, and time. She appears well-developed and well-nourished. No distress.  HENT:  Head: Normocephalic and atraumatic.  Right Ear: External ear normal.  Left Ear: External ear normal.  Nose: Nose normal.  Mouth/Throat: Oropharynx is clear and moist.  Eyes: Pupils are equal, round, and reactive to light.  Neck: Normal range of motion. Neck supple. No thyromegaly present.  Cardiovascular: Normal rate, regular rhythm, normal heart sounds and intact distal pulses.   No murmur heard. Pulmonary/Chest: Effort normal and breath sounds normal. No respiratory distress. She has no wheezes.  Abdominal: Soft. Bowel sounds are normal. She exhibits no distension. There is no tenderness.  Musculoskeletal: Normal range of motion. She exhibits no edema or tenderness.  Neurological:  She is alert and oriented to person, place, and time. She has normal reflexes. No cranial nerve deficit.  Skin: Skin is warm and dry.  Psychiatric: She has a normal mood and affect. Her behavior is normal. Judgment and thought content normal.  Vitals reviewed.     Temp(Src) 97.6 F (36.4 C) (Oral)  Ht $R'5\' 7"'AY$  (1.702 m)  Wt 193 lb 9.6 oz (87.816 kg)  BMI 30.31 kg/m2  LMP 12/03/2014 (Approximate)     Assessment & Plan:  1. Hospital discharge follow-up - CMP14+EGFR  2. Stuttering - CMP14+EGFR - Ambulatory referral to Neurology - Ambulatory referral to Speech Therapy  3. Aphasia - CMP14+EGFR - Ambulatory referral to Neurology - Ambulatory referral to Speech Therapy  4. H/O knee surgery - CMP14+EGFR   Referrals pending Continue all meds Labs pending Health Maintenance reviewed Diet and exercise encouraged RTO  As needed and keep chronic follow up  Evelina Dun, FNP

## 2014-12-31 NOTE — Patient Instructions (Signed)
Aphasia Aphasia is a neurological disorder caused by damage to the parts of the brain that control language. CAUSES  Aphasia is not a disease, but a symptom of brain damage. Aphasia is commonly seen in adults who have suffered a stroke. Aphasia also can result from:  A brain tumor.  Infection.  Head injury.  A rare type of dementia called Primary Progressive Aphasia. Common types of dementia may be associated with aphasia but can also exist without language problems. SYMPTOMS  Primary signs of the disorder include:  Problems expressing oneself when speaking.  Trouble understanding speech.  Difficulty with reading and writing.  Speaking in short or incomplete sentences.  Speaking in sentences that don't make sense.  Speaking unrecognizable words.  Interpreting figurative language literally.  Writing sentences that don't make sense. The type and severity of language problems depend on the precise location and extent of the damaged brain tissue. Aphasia can be divided into four broad categories:  Expressive aphasia - difficulty in conveying thoughts through speech or writing. The patient knows what they want to say, but cannot find the words they need.  Receptive aphasia - difficulty understanding spoken or written language. The patient hears the voice or sees the print but cannot make sense of the words.  Anomic or amnesia aphasia - difficulty in using the correct names for particular objects, people, places, or events. This is the least severe form of aphasia.  Global aphasia results from severe and extensive damage to the language areas of the brain. Patients lose almost all language function, both comprehension (understanding) and expression. They cannot speak, understand speech, read, or write. TREATMENT  Sometimes an individual will completely recover from aphasia without treatment. In most cases, language therapy should begin as soon as possible. Language therapy should  be tailored to the individual needs of the patient. Therapy with a speech pathologist involves exercises in which patients:  Read.  Write.  Follow directions.  Repeat what they hear.  Computer-aided therapy may also be used. PROGNOSIS  The outcome of aphasia is difficult to predict. People who are younger or have less extensive brain damage do better. The location of the injury is also important. The location is a clue to prognosis. In general, patients tend to recover skills in language comprehension (understanding) more completely than those skills involving expression (speaking or writing). Document Released: 01/20/2002 Document Revised: 07/23/2011 Document Reviewed: 07/20/2013 [facility] Patient Information 2015 [place], [place]. This information is not intended to replace advice given to you by your health care provider. Make sure you discuss any questions you have with your health care provider.

## 2014-12-31 NOTE — Procedures (Signed)
  [place] A. Merlene Laughter, MD     [email or web address]           HISTORY: The patient is a 44 year old who presents with episode of syncope worrisome for seizures. She also has staring spells.  MEDICATIONS: Scheduled Meds: Continuous Infusions: PRN Meds:.  Prior to Admission medications   Medication Sig Start Date End Date Taking? Authorizing Provider  amLODipine (NORVASC) 10 MG tablet Take 1 tablet (10 mg total) by mouth daily. 08/20/14   Sharion Balloon, FNP  HYDROcodone-acetaminophen (NORCO) 7.5-325 MG per tablet Take 1 tablet by mouth every 4 (four) hours as needed for moderate pain. 12/09/14   Sanjuana Kava, MD  ibuprofen (ADVIL,MOTRIN) 200 MG tablet Take 400 mg by mouth every 6 (six) hours as needed for moderate pain.    Historical Provider, MD  lisinopril-hydrochlorothiazide (ZESTORETIC) 20-12.5 MG per tablet Take 2 tablets by mouth daily. 07/19/14   Sharion Balloon, FNP  Vitamin D, Ergocalciferol, (DRISDOL) 50000 UNITS CAPS capsule Take 1 capsule (50,000 Units total) by mouth every 7 (seven) days. 06/30/14   Sharion Balloon, FNP      ANALYSIS: A 16 channel recording using standard 10 20 measurements is conducted for 23 minutes. There is a well-formed posterior dominant rhythm of 10 Hz which attenuates with eye opening. There is beta activity observed in the frontal areas. Awake and drowsy activities are observed. Photic stimulation and hyperventilation are not carried out. There is no focal or lateral slowing. There is no epileptiform activity is observed.   IMPRESSION: This is a normal recording of the awake and drowsy states.      Truxton Stupka A. Merlene Laughter, M.D.  Diplomate, Tax adviser of Psychiatry and Neurology ( Neurology).

## 2015-01-01 LAB — CMP14+EGFR
ALT: 24 IU/L (ref 0–32)
AST: 20 IU/L (ref 0–40)
Albumin/Globulin Ratio: 1.6 (ref 1.1–2.5)
Albumin: 4.7 g/dL (ref 3.5–5.5)
Alkaline Phosphatase: 63 IU/L (ref 39–117)
BUN/Creatinine Ratio: 14 (ref 9–23)
BUN: 10 mg/dL (ref 6–24)
Bilirubin Total: 0.4 mg/dL (ref 0.0–1.2)
CO2: 26 mmol/L (ref 18–29)
Calcium: 10.3 mg/dL — ABNORMAL HIGH (ref 8.7–10.2)
Chloride: 99 mmol/L (ref 97–108)
Creatinine, Ser: 0.72 mg/dL (ref 0.57–1.00)
GFR calc Af Amer: 116 mL/min/{1.73_m2} (ref >59)
GFR calc non Af Amer: 101 mL/min/{1.73_m2} (ref >59)
Globulin, Total: 2.9 g/dL (ref 1.5–4.5)
Glucose: 89 mg/dL (ref 65–99)
Potassium: 4.1 mmol/L (ref 3.5–5.2)
Sodium: 142 mmol/L (ref 134–144)
Total Protein: 7.6 g/dL (ref 6.0–8.5)

## 2015-01-03 ENCOUNTER — Encounter: Payer: 59 | Admitting: Physical Therapy

## 2015-01-07 ENCOUNTER — Encounter: Payer: Self-pay | Admitting: Physical Therapy

## 2015-01-07 ENCOUNTER — Ambulatory Visit: Payer: 59 | Admitting: Physical Therapy

## 2015-01-07 DIAGNOSIS — M25562 Pain in left knee: Secondary | ICD-10-CM

## 2015-01-07 DIAGNOSIS — M25662 Stiffness of left knee, not elsewhere classified: Secondary | ICD-10-CM

## 2015-01-07 NOTE — Therapy (Signed)
[facility] [place], [place], [address] Phone: [phone]   Fax:  [phone]  Physical Therapy Treatment  Patient Details  Name: Brittany Durham MRN: [id number] Date of Birth: 12-02-1970 Referring Provider:  Sharion Balloon, FNP  Encounter Date: 01/07/2015      PT End of Session - 01/07/15 1130    Visit Number 4   Number of Visits 12   Date for PT Re-Evaluation 02/01/15   PT Start Time 1116   PT Stop Time 1220   PT Time Calculation (min) 64 min   Activity Tolerance Patient tolerated treatment well   Behavior During Therapy [facility] for tasks assessed/performed      Past Medical History  Diagnosis Date  . Hypertension   . Seizures   . Thyroid nodule   . Knee pain     left meniscus tear s/p repair 11/2014    Past Surgical History  Procedure Laterality Date  . Tubal ligation    . Wisdom tooth extraction    . Knee arthroscopy with medial menisectomy Left 12/09/2014    Procedure: KNEE ARTHROSCOPY WITH PARTIAL MEDIAL AND LATERAL MENISECTOMY;  Surgeon: Sanjuana Kava, MD;  Location: [facility];  Service: Orthopedics;  Laterality: Left;    There were no vitals filed for this visit.  Visit Diagnosis:  Left knee pain  Knee stiffness, left      Subjective Assessment - 01/07/15 1130    Subjective Denies any pain today. Reports that MD was pleased with progress and that inflammation on the L lateral knee has decreased. Reports that she has been walking more.   Limitations Walking   Patient Stated Goals Get knee back to normal.   Currently in Pain? No/denies            [facility] PT Assessment - 01/07/15 0001    Assessment   Medical Diagnosis Left knee arthroscopy.   Onset Date/Surgical Date 12/09/14   ROM / Strength   AROM / PROM / Strength AROM   AROM   Overall AROM  Within functional limits for tasks performed   AROM Assessment Site Knee   Right/Left Knee Left   Right Knee Flexion 124                     [facility] Adult PT Treatment/Exercise - 01/07/15 0001    Knee/Hip Exercises: Aerobic   Nustep L5 x15 min   Knee/Hip Exercises: Standing   Rocker Board 3 minutes   Knee/Hip Exercises: Supine   Short Arc Quad Sets Strengthening;Left;3 sets;10 reps;Other (comment)  5 sec hold   Heel Slides AROM;Left;3 sets;10 reps   Straight Leg Raises Strengthening;Left;3 sets;10 reps  5 sec hold   Knee/Hip Exercises: Sidelying   Hip ABduction Strengthening;Left;3 sets;10 reps;Other (comment)  5 sec hold   Hip ADduction Strengthening;Left;3 sets;10 reps;Other (comment)  5 sec hold   Knee/Hip Exercises: Prone   Hip Extension Strengthening;Left;3 sets;10 reps   Modalities   Modalities Vasopneumatic   Vasopneumatic   Number Minutes Vasopneumatic  15 minutes   Vasopnuematic Location  Knee   Vasopneumatic Pressure Medium   Vasopneumatic Temperature  34                     PT Long Term Goals - 01/07/15 1209    PT LONG TERM GOAL #1   Title Ind with HEP.   Time 6   Period Weeks   Status Achieved   PT LONG TERM GOAL #2  Title Active left knee flexion to 115 degrees+ so the patient can perform functional tasks and do so with pain not > 2-3/10.   Time 6   Period Weeks   Status Achieved   PT LONG TERM GOAL #3   Title Increase left  knee strength to a solid 4+/5 to provide good stability for accomplishment of functional activities   Time 6   Period Weeks   Status On-going   PT LONG TERM GOAL #4   Title Normal gait pattern without assistive device.   Time 6   Period Weeks   Status On-going   PT LONG TERM GOAL #5   Title Perform ADL's with pain not > 3/10.   Time 6   Period Weeks   Status Achieved               Plan - 01/07/15 1210    Clinical Impression Statement Patient did very well during PT session with no complaints of pain during any exercises. Inflammation over the lateral L knee has decreased but still present. No issues were reported with heel slides during  today's treatment. All exercises were completed today with good form and technique. Patient continues to ambulate with an antalgic gait and no assistive device. Normal vasopneumatic system response noted following removal of the vasopneumatic system. AROM of L knee was meassured in sitting as 118 deg. Denied any soreness around the inflammed region in the L lateral knee or around L patella.   Pt will benefit from skilled therapeutic intervention in order to improve on the following deficits Pain;Decreased activity tolerance   Rehab Potential Good   PT Frequency 2x / week   PT Duration 6 weeks   PT Treatment/Interventions ADLs/Self Care Home Management;Cryotherapy;Electrical Stimulation;Gait training;Neuromuscular re-education;Balance training;Therapeutic exercise;Manual techniques;Passive range of motion   PT Next Visit Plan Continue Nustep; ROM; GT: quad sets and SLR's.   Consulted and Agree with Plan of Care Patient;Family member/caregiver        Problem List Patient Active Problem List   Diagnosis Date Noted  . Thyroid nodule 12/14/2014  . Left knee pain 12/14/2014  . Seizures   . Altered mental status 12/12/2014  . Syncope 12/12/2014  . Vitamin D deficiency 06/30/2014  . GERD (gastroesophageal reflux disease) 06/29/2014  . SYNCOPE 03/02/2010  . HYPERTENSION, BENIGN 03/01/2010    Wynelle Fanny, PTA 01/07/2015, 12:24 PM  [facility] [address] [place], [place], [address] Phone: [phone]   Fax:  [phone]

## 2015-01-19 ENCOUNTER — Ambulatory Visit: Payer: 59 | Attending: Orthopaedic Surgery | Admitting: Physical Therapy

## 2015-01-19 ENCOUNTER — Encounter: Payer: Self-pay | Admitting: Physical Therapy

## 2015-01-19 DIAGNOSIS — M25662 Stiffness of left knee, not elsewhere classified: Secondary | ICD-10-CM | POA: Diagnosis not present

## 2015-01-19 DIAGNOSIS — M25562 Pain in left knee: Secondary | ICD-10-CM

## 2015-01-19 NOTE — Therapy (Signed)
[facility] [address] [place], [place], [address] Phone: [phone]   Fax:  [phone]  Physical Therapy Treatment  Patient Details  Name: Brittany Durham MRN: [id number] Date of Birth: 11/28/1970 Referring Provider:  [name], FNP  Encounter Date: 01/19/2015      PT End of Session - 01/19/15 1435    Visit Number 5   Number of Visits 12   Date for PT Re-Evaluation 02/01/15   PT Start Time 1359   PT Stop Time 1445   PT Time Calculation (min) 46 min   Activity Tolerance Patient tolerated treatment well      Past Medical History  Diagnosis Date  . Hypertension   . Seizures   . Thyroid nodule   . Knee pain     left meniscus tear s/p repair 11/2014    Past Surgical History  Procedure Laterality Date  . Tubal ligation    . Wisdom tooth extraction    . Knee arthroscopy with medial menisectomy Left 12/09/2014    Procedure: KNEE ARTHROSCOPY WITH PARTIAL MEDIAL AND LATERAL MENISECTOMY;  Surgeon: Darreld Mclean, MD;  Location: [facility];  Service: Orthopedics;  Laterality: Left;    There were no vitals filed for this visit.  Visit Diagnosis:  Knee stiffness, left  Left knee pain      Subjective Assessment - 01/19/15 1404    Subjective patient feels no pain in knee today.   Limitations Walking   Patient Stated Goals Get knee back to normal.   Currently in Pain? No/denies                         [facility] Adult PT Treatment/Exercise - 01/19/15 0001    Knee/Hip Exercises: Aerobic   Nustep L5 x15 min   Knee/Hip Exercises: Standing   Lateral Step Up Left;10 reps;Step Height: 6"   Forward Step Up Left;3 sets;10 reps;Step Height: 6"   Rocker Board 3 minutes   Knee/Hip Exercises: Seated   Long Arc Quad Strengthening;Left;3 sets;10 reps   Long Arc Quad Weight 3 lbs.   Modalities   Modalities Vasopneumatic   Vasopneumatic   Number Minutes Vasopneumatic  15 minutes   Vasopnuematic Location  Knee   Vasopneumatic Pressure Medium                     PT Long Term Goals - 01/07/15 1209    PT LONG TERM GOAL #1   Title Ind with HEP.   Time 6   Period Weeks   Status Achieved   PT LONG TERM GOAL #2   Title Active left knee flexion to 115 degrees+ so the patient can perform functional tasks and do so with pain not > 2-3/10.   Time 6   Period Weeks   Status Achieved   PT LONG TERM GOAL #3   Title Increase left  knee strength to a solid 4+/5 to provide good stability for accomplishment of functional activities   Time 6   Period Weeks   Status On-going   PT LONG TERM GOAL #4   Title Normal gait pattern without assistive device.   Time 6   Period Weeks   Status On-going   PT LONG TERM GOAL #5   Title Perform ADL's with pain not > 3/10.   Time 6   Period Weeks   Status Achieved               Plan - 01/19/15  1436    Clinical Impression Statement Patient progressing with strength exercises slowly, patient had some difficulty and soreness with step ups esp side step. Patient did better with low level strengthening. May continue with strength sitting or supine per patient tolerance to avoid pain. Goals ongoing to to strength deficits.    Pt will benefit from skilled therapeutic intervention in order to improve on the following deficits Pain;Decreased activity tolerance   Rehab Potential Good   PT Frequency 2x / week   PT Duration 6 weeks   PT Treatment/Interventions ADLs/Self Care Home Management;Cryotherapy;Electrical Stimulation;Gait training;Neuromuscular re-education;Balance training;Therapeutic exercise;Manual techniques;Passive range of motion   PT Next Visit Plan Continue Nustep; ROM; GT: quad sets and SLR's.   Consulted and Agree with Plan of Care Patient        Problem List Patient Active Problem List   Diagnosis Date Noted  . Thyroid nodule 12/14/2014  . Left knee pain 12/14/2014  . Seizures   . Altered mental status 12/12/2014  . Syncope  12/12/2014  . Vitamin D deficiency 06/30/2014  . GERD (gastroesophageal reflux disease) 06/29/2014  . SYNCOPE 03/02/2010  . HYPERTENSION, BENIGN 03/01/2010    Bandon Sherwin P, PTA 01/19/2015, 2:46 PM  [facility] [address] [place], [place], [address] Phone: [phone]   Fax:  [phone]

## 2015-01-27 ENCOUNTER — Ambulatory Visit (HOSPITAL_COMMUNITY): Payer: 59 | Attending: Family | Admitting: Speech Pathology

## 2015-01-27 DIAGNOSIS — R479 Unspecified speech disturbances: Secondary | ICD-10-CM | POA: Diagnosis present

## 2015-01-27 DIAGNOSIS — R4789 Other speech disturbances: Secondary | ICD-10-CM

## 2015-01-27 NOTE — Therapy (Signed)
Brittany Durham, [place], [address] Phone: [phone]   Fax:  [phone]  Speech Language Pathology Evaluation  Patient Details  Name: Brittany Durham MRN: [id number] Date of Birth: 08-03-1970 Referring Provider:  Sharion Balloon, FNP  Encounter Date: 01/27/2015      End of Session - 01/27/15 1755    Visit Number 1   Number of Visits 16   Date for SLP Re-Evaluation 03/29/15   Authorization Type [employer]   SLP Start Time 1430   SLP Stop Time  1522   SLP Time Calculation (min) 52 min   Activity Tolerance Patient tolerated treatment well;Other (comment)  pt very tearful      Past Medical History  Diagnosis Date  . Hypertension   . Seizures   . Thyroid nodule   . Knee pain     left meniscus tear s/p repair 11/2014    Past Surgical History  Procedure Laterality Date  . Tubal ligation    . Wisdom tooth extraction    . Knee arthroscopy with medial menisectomy Left 12/09/2014    Procedure: KNEE ARTHROSCOPY WITH PARTIAL MEDIAL AND LATERAL MENISECTOMY;  Surgeon: Sanjuana Kava, MD;  Location: [facility];  Service: Orthopedics;  Laterality: Left;    There were no vitals filed for this visit.  Visit Diagnosis: Speech dysfluency      Subjective Assessment - 01/27/15 1458    Subjective "can't get out what I want to say"   Patient is accompained by: Family member   Special Tests SAGE   Currently in Pain? No/denies            SLP Evaluation [facility] - 01/27/15 1458    SLP Visit Information   SLP Received On 01/27/15   Onset Date 12/13/2014   Subjective   Subjective Stuttering after knee surgery   Patient/Family Stated Goal Get speech better   General Information   Behavioral/Cognition WNL; tearful   Mobility Status ambulatory   Prior Functional Status   Cognitive/Linguistic Baseline Within functional limits   Type of Home House    Lives With Family   Education high school graduate   Vocation Other (comment)   employed at [employer] in [place] x 13 years   Cognition   Overall Cognitive Status Within Functional Limits for tasks assessed   Auditory Comprehension   Overall Auditory Comprehension Appears within functional limits for tasks assessed   Yes/No Questions Within Functional Limits   Commands Within Functional Limits   Conversation Complex   Visual Recognition/Discrimination   Discrimination Within Function Limits   Reading Comprehension   Reading Status Within funtional limits   Expression   Primary Mode of Expression Verbal   Verbal Expression   Overall Verbal Expression Impaired   Initiation Impaired   Level of Generative/Spontaneous Verbalization Phrase   Repetition Impaired   Level of Impairment Word level   Naming Impairment   Responsive 76-100% accurate   Confrontation 75-100% accurate   Convergent Not tested   Divergent 50-74% accurate   Verbal Errors Inconsistent  dysfluency   Pragmatics No impairment   Non-Verbal Means of Communication Not applicable   Written Expression   Dominant Hand Right   Written Expression Within Functional Limits   Oral Motor/Sensory Function   Overall Oral Motor/Sensory Function Appears within functional limits for tasks assessed   Motor Speech   Overall Motor Speech Impaired   Respiration Impaired   Level of Impairment Word   Phonation Other (comment)  will assess  further   Resonance Within functional limits   Articulation Impaired   Level of Impairment Word   Intelligibility Intelligibility reduced   Word 25-49% accurate   Phrase 25-49% accurate   Sentence 25-49% accurate   Conversation 25-49% accurate   Motor Planning Witnin functional limits   Motor Speech Errors Inconsistent;Aware;Groping for words   Effective Techniques Slow rate  breath control   Phonation Impaired   Volume Appropriate   Pitch Appropriate   Standardized Assessments   Standardized Assessments  Self-Administered Gero-Cognitive Examination   Self-Administered  Gero-Cognitive Examination  Errors: decreased attention to detail, mental calculation, problem solving, prospective memory, generational naming            SLP Education - 01/27/15 1754    Education provided Yes   Education Details Breath support and voicing exercises    Person(s) Educated Patient   Methods Explanation;Demonstration;Verbal cues   Comprehension Verbalized understanding;Returned demonstration;Need further instruction          SLP Short Term Goals - 01/27/15 1758    SLP SHORT TERM GOAL #1   Title Pt will imitate breathing and relaxation exercises in structured tasks with mi/mod cues   Baseline Max cues   Time 4   Period Weeks   Status New   SLP SHORT TERM GOAL #2   Title Pt will implement fluency enhancing strategies in short phrases for 80% fluency with mod cues.   Baseline 50%   Time 4   Period Weeks   Status New   SLP SHORT TERM GOAL #3   Title Pt will utilize continuous airflow technique on 75% of structured trials with mod cues.   Baseline No baseline knowledge   Time 4   Period Weeks   Status New   SLP SHORT TERM GOAL #4   Title Further assess expressive language skills to see if there is an aphasic component to her deficits.   Baseline    Time 4   Period Weeks   Status New          SLP Long Term Goals - 01/27/15 1759    SLP LONG TERM GOAL #1   Title Increase verbal fluency to [facility] for basic conversations with use of compensatory strategies as needed.   Baseline mod/severe impairment   Time 8   Period Weeks   Status New          Plan - 01/27/15 1756    Clinical Impression Statement 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  Pt reports sudden onset after a possible vaso vagal response when she "passed out" after knee surgery. She has not yet followed up with outpatient neurology, but has an appointment on  September 29 with Dr. Krista Blue. EEG completed 12/24/2014 was normal. Pt was tearful throughout the evaluation and demonstrated mod/severe verbal dysfluency throughout. She was unable to hum, sing, and had difficulty with breath support. Recommend SLP therapy to address current speech impairment.    Speech Therapy Frequency 2x / week   Duration --  8 weeks   Treatment/Interventions Compensatory techniques;Cueing hierarchy;SLP instruction and feedback;Patient/family education;Compensatory strategies   Potential to Achieve Goals Fair   Potential Considerations Severity of impairments;Other (comment)  unknown onset/cause   SLP Home Exercise Plan Pt will complete HEP as assigned to facilitate carryover of treatment strategies and techniques in home/community setting.   Consulted and Agree with Plan of Care Patient  Problem List Patient Active Problem List   Diagnosis Date Noted  . Thyroid nodule 12/14/2014  . Left knee pain 12/14/2014  . Seizures   . Altered mental status 12/12/2014  . Syncope 12/12/2014  . Vitamin D deficiency 06/30/2014  . GERD (gastroesophageal reflux disease) 06/29/2014  . SYNCOPE 03/02/2010  . HYPERTENSION, BENIGN 03/01/2010   Thank you,  Genene Churn, [place] 01/27/2015, 6:01 PM  [facility] [address] [place], [place], [address] Phone: [phone]   Fax:  [phone]

## 2015-01-31 ENCOUNTER — Encounter: Payer: Self-pay | Admitting: Physical Therapy

## 2015-01-31 ENCOUNTER — Ambulatory Visit: Payer: 59 | Admitting: Physical Therapy

## 2015-01-31 DIAGNOSIS — M25562 Pain in left knee: Secondary | ICD-10-CM

## 2015-01-31 DIAGNOSIS — M25662 Stiffness of left knee, not elsewhere classified: Secondary | ICD-10-CM | POA: Diagnosis not present

## 2015-01-31 NOTE — Therapy (Signed)
[facility] [place], [place], [address] Phone: [phone]   Fax:  [phone]  Physical Therapy Treatment  Patient Details  Name: Brittany Durham MRN: [id number] Date of Birth: [birth year] Referring Provider:  Sharion Balloon, FNP  Encounter Date: 01/31/2015      PT End of Session - 01/31/15 1119    Visit Number 6   Number of Visits 12   Date for PT Re-Evaluation 02/01/15   PT Start Time 1118   PT Stop Time 1205   PT Time Calculation (min) 47 min   Activity Tolerance Patient tolerated treatment well   Behavior During Therapy [facility] for tasks assessed/performed      Past Medical History  Diagnosis Date  . Hypertension   . Seizures   . Thyroid nodule   . Knee pain     left meniscus tear s/p repair 11/2014    Past Surgical History  Procedure Laterality Date  . Tubal ligation    . Wisdom tooth extraction    . Knee arthroscopy with medial menisectomy Left 12/09/2014    Procedure: KNEE ARTHROSCOPY WITH PARTIAL MEDIAL AND LATERAL MENISECTOMY;  Surgeon: Sanjuana Kava, MD;  Location: [facility];  Service: Orthopedics;  Laterality: Left;    There were no vitals filed for this visit.  Visit Diagnosis:  Knee stiffness, left  Left knee pain      Subjective Assessment - 01/31/15 1119    Subjective Reports no pain in knee and "feeling good." Reports that at times her L knee wants to buckle and give way with her.   Limitations Walking   Patient Stated Goals Get knee back to normal.   Currently in Pain? No/denies            [facility] PT Assessment - 01/31/15 0001    Assessment   Medical Diagnosis Left knee arthroscopy.   Onset Date/Surgical Date 12/09/14   Next MD Visit 02/01/2015   ROM / Strength   AROM / PROM / Strength AROM   AROM   Overall AROM  Within functional limits for tasks performed   AROM Assessment Site Knee   Right/Left Knee Left   Left Knee Extension 0   Left Knee Flexion 124   Ambulation/Gait   Ambulation/Gait Yes   Ambulation/Gait Assistance 7: Independent   Ambulation Distance (Feet) 80 Feet   Assistive device None   Gait Pattern Step-through pattern;Decreased stance time - left;Decreased stride length;Decreased weight shift to left;Decreased dorsiflexion - left;Antalgic   Ambulation Surface Level;Indoor   Gait velocity Slightly slower gait velocity than normal                     [facility] Adult PT Treatment/Exercise - 01/31/15 0001    Knee/Hip Exercises: Aerobic   Nustep L6 x12 min   Knee/Hip Exercises: Standing   Terminal Knee Extension Limitations LLE pink XTS x20 reps  Reports of superior L knee pain   Lateral Step Up Left;3 sets;10 reps;Hand Hold: 2;Step Height: 6"   Forward Step Up Left;3 sets;10 reps;Step Height: 6"   Step Down Left;3 sets;10 reps;Hand Hold: 2;Step Height: 4"   Rocker Board 3 minutes   Knee/Hip Exercises: Seated   Long Arc Quad Strengthening;Left;3 sets;10 reps   Long Arc Quad Weight 3 lbs.   Knee/Hip Exercises: Supine   Straight Leg Raises Strengthening;Left;3 sets;10 reps  2#   Knee/Hip Exercises: Sidelying   Hip ABduction Strengthening;Left;3 sets;10 reps;Other (comment)  2#  PT Long Term Goals - 01/07/15 1209    PT LONG TERM GOAL #1   Title Ind with HEP.   Time 6   Period Weeks   Status Achieved   PT LONG TERM GOAL #2   Title Active left knee flexion to 115 degrees+ so the patient can perform functional tasks and do so with pain not > 2-3/10.   Time 6   Period Weeks   Status Achieved   PT LONG TERM GOAL #3   Title Increase left  knee strength to a solid 4+/5 to provide good stability for accomplishment of functional activities   Time 6   Period Weeks   Status On-going   PT LONG TERM GOAL #4   Title Normal gait pattern without assistive device.   Time 6   Period Weeks   Status On-going   PT LONG TERM GOAL #5   Title Perform ADL's with pain not > 3/10.   Time 6   Period Weeks    Status Achieved               Plan - 01/31/15 1206    Clinical Impression Statement Patient tolerated treatment well today with no complaints of pain other than the superior L knee pain experienced during TKE with pink XTS. Continues to demonstrate L quad weakness with exercises that is notable with shaking. Continues to present with notable L superiorlateral knee inflammation that is more notable during L knee flexion. AROM of L knee measures as 0-124 deg in supine. Goals remain on-going secondary to gait abmormalities, and strength deficits. Denied modalities during today's session and denied pain following treatment.   Pt will benefit from skilled therapeutic intervention in order to improve on the following deficits Pain;Decreased activity tolerance   Rehab Potential Good   PT Frequency 2x / week   PT Duration 6 weeks   PT Treatment/Interventions ADLs/Self Care Home Management;Cryotherapy;Electrical Stimulation;Gait training;Neuromuscular re-education;Balance training;Therapeutic exercise;Manual techniques;Passive range of motion   PT Next Visit Plan Continue Nustep; ROM; GT: quad sets and SLR's.   Consulted and Agree with Plan of Care Patient        Problem List Patient Active Problem List   Diagnosis Date Noted  . Thyroid nodule 12/14/2014  . Left knee pain 12/14/2014  . Seizures   . Altered mental status 12/12/2014  . Syncope 12/12/2014  . Vitamin D deficiency 06/30/2014  . GERD (gastroesophageal reflux disease) 06/29/2014  . SYNCOPE 03/02/2010  . HYPERTENSION, BENIGN 03/01/2010    Ahmed Prima, PTA 01/31/2015 12:13 PM  Mali Applegate MPT [facility] [address] [place], [place], [address] Phone: [phone]   Fax:  [phone]

## 2015-02-07 ENCOUNTER — Ambulatory Visit: Payer: 59 | Admitting: Physical Therapy

## 2015-02-07 DIAGNOSIS — M25662 Stiffness of left knee, not elsewhere classified: Secondary | ICD-10-CM | POA: Diagnosis not present

## 2015-02-07 DIAGNOSIS — M25562 Pain in left knee: Secondary | ICD-10-CM

## 2015-02-07 NOTE — Therapy (Signed)
[facility] [place], [place], [address] Phone: [phone]   Fax:  [phone]  Physical Therapy Treatment  Patient Details  Name: Brittany Durham MRN: [id number] Date of Birth: [birth year] Referring Provider:  Sharion Balloon, FNP  Encounter Date: 02/07/2015      PT End of Session - 02/07/15 1348    Visit Number 7   Number of Visits 24   Date for PT Re-Evaluation 03/21/15   PT Start Time 1347   PT Stop Time 1445   PT Time Calculation (min) 58 min   Activity Tolerance Patient tolerated treatment well   Behavior During Therapy [facility] for tasks assessed/performed      Past Medical History  Diagnosis Date  . Hypertension   . Seizures   . Thyroid nodule   . Knee pain     left meniscus tear s/p repair 11/2014    Past Surgical History  Procedure Laterality Date  . Tubal ligation    . Wisdom tooth extraction    . Knee arthroscopy with medial menisectomy Left 12/09/2014    Procedure: KNEE ARTHROSCOPY WITH PARTIAL MEDIAL AND LATERAL MENISECTOMY;  Surgeon: Sanjuana Kava, MD;  Location: [facility];  Service: Orthopedics;  Laterality: Left;    There were no vitals filed for this visit.  Visit Diagnosis:  Knee stiffness, left - Plan: PT plan of care cert/re-cert  Left knee pain - Plan: PT plan of care cert/re-cert      Subjective Assessment - 02/07/15 1349    Subjective Patient reports she fell on Saturday when putting out the trash. She fell directly on the L knee. It was a misstep. Her knee did not buckle causing the fall. She now reports some pain with walking and with active knee flexion.   Currently in Pain? No/denies            [facility] PT Assessment - 02/07/15 0001    Assessment   Medical Diagnosis Left knee arthroscopy.   Onset Date/Surgical Date 12/09/14   Next MD Visit 02/10/15   ROM / Strength   AROM / PROM / Strength AROM;Strength   AROM   AROM Assessment Site Knee   Right/Left Knee Left   Left Knee  Extension 0   Left Knee Flexion 109  passive 120 with pain   Strength   Overall Strength Comments Left knee ext 5/5, flex not MMT,but min of 3/5                     [facility] Adult PT Treatment/Exercise - 02/07/15 0001    Knee/Hip Exercises: Aerobic   Nustep L6 x13 min   Knee/Hip Exercises: Standing   Heel Raises 20 reps  10 ea toes straight and out   Terminal Knee Extension Limitations LLE pink XTS x20 reps 5 sec hold  no pain noted   Lateral Step Up Left;10 reps;Hand Hold: 2;2 sets;Step Height: 8"   Forward Step Up Left;10 reps;2 sets;Step Height: 8"   Step Down Left;3 sets;10 reps;Hand Hold: 2;Step Height: 4"   SLS on level and compliant surface; level surface with knee raise and hip extension;    SLS with Vectors multiple trials with Rt toe tap; difficult   Modalities   Modalities Vasopneumatic   Vasopneumatic   Number Minutes Vasopneumatic  15 minutes   Vasopnuematic Location  Knee   Vasopneumatic Pressure Medium   Vasopneumatic Temperature  3*  PT Long Term Goals - 02/07/15 1534    PT LONG TERM GOAL #1   Title Ind with HEP.   Time 6   Period Weeks   PT LONG TERM GOAL #2   Title Active left knee flexion to 115 degrees+ so the patient can perform functional tasks and do so with pain not > 2-3/10.   Time 6   Period Weeks   Status Achieved   PT LONG TERM GOAL #3   Title Increase left  knee strength to a solid 4+/5 to provide good stability for accomplishment of functional activities   Period Weeks   Status Partially Met   PT LONG TERM GOAL #4   Title Normal gait pattern without assistive device.   Time 6   Period Weeks   Status On-going   PT LONG TERM GOAL #5   Title Perform ADL's with pain not > 3/10.   Time 6   Period Weeks   Status Achieved               Plan - 02/07/15 1536    Clinical Impression Statement Patient reported she fell on Saturday when she misstepped on a walkway. She iced the knee and denied  pain at start of treatment today. She fell directly on L knee and since has had some pain with ambulation up to 6-7/10, but not at rest. Her L knee fleixon showed a decrease actively today by 15 deg, but passively was 120 deg. She reported pain in L knee with passive ROM.   Pt will benefit from skilled therapeutic intervention in order to improve on the following deficits Pain;Decreased activity tolerance;Decreased balance;Abnormal gait;Decreased strength   Rehab Potential Good   PT Frequency 2x / week   PT Duration 6 weeks   PT Treatment/Interventions ADLs/Self Care Home Management;Cryotherapy;Electrical Stimulation;Gait training;Neuromuscular re-education;Balance training;Therapeutic exercise;Manual techniques;Passive range of motion;Vasopneumatic Device;Patient/family education   PT Next Visit Plan See what MD reports; progress therex as able per protocol, balance, gait and quad strengthening.   Consulted and Agree with Plan of Care Patient        Problem List Patient Active Problem List   Diagnosis Date Noted  . Thyroid nodule 12/14/2014  . Left knee pain 12/14/2014  . Seizures   . Altered mental status 12/12/2014  . Syncope 12/12/2014  . Vitamin D deficiency 06/30/2014  . GERD (gastroesophageal reflux disease) 06/29/2014  . SYNCOPE 03/02/2010  . HYPERTENSION, BENIGN 03/01/2010    Madelyn Flavors PT  02/07/2015, 3:49 PM  [facility] [address] [place], [place], [address] Phone: [phone]   Fax:  [phone]

## 2015-02-08 ENCOUNTER — Ambulatory Visit (HOSPITAL_COMMUNITY): Payer: 59 | Admitting: Speech Pathology

## 2015-02-10 ENCOUNTER — Ambulatory Visit (INDEPENDENT_AMBULATORY_CARE_PROVIDER_SITE_OTHER): Payer: 59 | Admitting: Neurology

## 2015-02-10 ENCOUNTER — Encounter: Payer: Self-pay | Admitting: Neurology

## 2015-02-10 ENCOUNTER — Telehealth: Payer: Self-pay | Admitting: *Deleted

## 2015-02-10 VITALS — BP 137/89 | HR 94 | Ht 67.0 in | Wt 196.0 lb

## 2015-02-10 DIAGNOSIS — R4789 Other speech disturbances: Secondary | ICD-10-CM | POA: Diagnosis not present

## 2015-02-10 DIAGNOSIS — F809 Developmental disorder of speech and language, unspecified: Secondary | ICD-10-CM

## 2015-02-10 NOTE — Progress Notes (Signed)
PATIENTNikol Durham DOB: [birth year]  Chief Complaint  Patient presents with  . Stuttering    She is here with her daughter, Brittany Durham.  Reports two months ago she started having a stuttering speech and it has progressively gotten worse. She has also been found unresponsive multiple times.  She was just recently evaluated at the ED but they have not been informed of all her results.     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, she had left knee arthroscopic surgery under general anesthesia in December 09 2014 at [facility], initially she recovered very well  While changing her left knee wrap at home in July 30 first 2016, she complained of pain, started hyperventilation, had transient loss of consciousness, EMS was called, no seizure-like activity, she was noted to have difficulty talking, stuttering of the speech, he was taken to the emergency room, had extensive evaluations,  I have reviewed the record, EEG  and echocardiogram was normal in August 2016  I personally reviewed MRI MRA of the brain that was normal, laboratory showed normal CMP, CBC, UDS was positive for opiates  Her speech overall has much improved, but not back to her baseline yet, she denies visual change, no arm weakness,   REVIEW OF SYSTEMS: Full 14 system review of systems performed and notable only for as above  ALLERGIES: No Known Allergies  HOME MEDICATIONS: Current Outpatient Prescriptions  Medication Sig Dispense Refill  . amLODipine (NORVASC) 10 MG tablet Take 1 tablet (10 mg total) by mouth daily. 90 tablet 3  . HYDROcodone-acetaminophen (NORCO) 7.5-325 MG per tablet Take 1 tablet by mouth every 4 (four) hours as needed for moderate pain. 120 tablet 0  . ibuprofen (ADVIL,MOTRIN) 200 MG tablet  Take 400 mg by mouth every 6 (six) hours as needed for moderate pain.    Marland Kitchen lisinopril-hydrochlorothiazide (ZESTORETIC) 20-12.5 MG per tablet Take 2 tablets by mouth daily. 180 tablet 4  . Vitamin D, Ergocalciferol, (DRISDOL) 50000 UNITS CAPS capsule Take 1 capsule (50,000 Units total) by mouth every 7 (seven) days. 12 capsule 3   No current facility-administered medications for this visit.    PAST MEDICAL HISTORY: Past Medical History  Diagnosis Date  . Hypertension   . Seizures   . Thyroid nodule   . Knee pain     left meniscus tear s/p repair 11/2014  . Stuttering     PAST SURGICAL HISTORY: Past Surgical History  Procedure Laterality Date  . Tubal ligation    . Wisdom tooth extraction    . Knee arthroscopy with medial menisectomy Left 12/09/2014    Procedure: KNEE ARTHROSCOPY WITH PARTIAL MEDIAL AND LATERAL MENISECTOMY;  Surgeon: Sanjuana Kava, MD;  Location: [facility];  Service: Orthopedics;  Laterality: Left;    FAMILY HISTORY: Family History  Problem Relation Age of Onset  . Hypertension Mother   . Hypertension Brother     SOCIAL HISTORY:  Social History   Social History  . Marital Status: Single    Spouse Name: N/A  . Number of Children: 1  . Years of Education: 12   Occupational History  . Umemployed    Social History Main Topics  . Smoking status: Never Smoker   . Smokeless tobacco: Never Used  . Alcohol Use: No  .  Drug Use: No  . Sexual Activity: Yes    Birth Control/ Protection: Surgical   Other Topics Concern  . Not on file   Social History Narrative   Lives at home alone.   Right-handed.   Occasional caffeine use.     PHYSICAL EXAM   Filed Vitals:   02/10/15 1225  BP: 137/89  Pulse: 94  Height: 5\' 7"  (1.702 m)  Weight: 196 lb (88.905 kg)    Not recorded      Body mass index is 30.69 kg/(m^2).  PHYSICAL EXAMNIATION:  Gen: NAD, conversant, well nourised, obese, well groomed                     Cardiovascular: Regular rate rhythm,  no peripheral edema, warm, nontender. Eyes: Conjunctivae clear without exudates or hemorrhage Neck: Supple, no carotid bruise. Pulmonary: Clear to auscultation bilaterally   NEUROLOGICAL EXAM:  MENTAL STATUS: Speech:    Deliberate speech, normal comprehension.  Cognition:     Orientation to time, place and person     Normal recent and remote memory     Normal Attention span and concentration     Normal Language, naming, repeating,spontaneous speech     Fund of knowledge   CRANIAL NERVES: CN II: Visual fields are full to confrontation. Fundoscopic exam is normal with sharp discs and no vascular changes. Pupils are round equal and briskly reactive to light. CN III, IV, VI: extraocular movement are normal. No ptosis. CN V: Facial sensation is intact to pinprick in all 3 divisions bilaterally. Corneal responses are intact.  CN VII: Face is symmetric with normal eye closure and smile. CN VIII: Hearing is normal to rubbing fingers CN IX, X: Palate elevates symmetrically. Phonation is normal. CN XI: Head turning and shoulder shrug are intact CN XII: Tongue is midline with normal movements and no atrophy.  MOTOR: There is no pronator drift of out-stretched arms. Muscle bulk and tone are normal. Muscle strength is normal.  REFLEXES: Reflexes are 2+ and symmetric at the biceps, triceps, knees, and ankles. Plantar responses are flexor.  SENSORY: Intact to light touch, pinprick, position sense, and vibration sense are intact in fingers and toes.  COORDINATION: Rapid alternating movements and fine finger movements are intact. There is no dysmetria on finger-to-nose and heel-knee-shin.    GAIT/STANCE: Antalgic gait due to left knee pain  DIAGNOSTIC DATA (LABS, IMAGING, TESTING) - I reviewed patient records, labs, notes, testing and imaging myself where available.   ASSESSMENT AND PLAN  Brittany Durham is a 44 y.o. female   Sudden onset stuttering of the speech, with normal MRI of the  brain, MRA of the brain, normal EEG, Not sure exactly etiology, Patient desires ENT evaluations, started refer  Marcial Pacas, M.D. Ph.D.  [facility] [address], [place], [place] [address] Ph: [phone] Fax: [phone]  CC: Sharion Balloon, FNP

## 2015-02-10 NOTE — Telephone Encounter (Addendum)
Arrived late for new patient appt - per Dr. Krista Blue, ok to see.

## 2015-02-15 ENCOUNTER — Ambulatory Visit (HOSPITAL_COMMUNITY): Payer: 59 | Attending: Family | Admitting: Speech Pathology

## 2015-02-15 DIAGNOSIS — R4789 Other speech disturbances: Secondary | ICD-10-CM

## 2015-02-15 DIAGNOSIS — R479 Unspecified speech disturbances: Secondary | ICD-10-CM | POA: Diagnosis not present

## 2015-02-15 NOTE — Therapy (Signed)
[place], [place], [address] Phone: [phone]   Fax:  [phone]  Speech Language Pathology Treatment  Patient Details  Name: Brittany Durham MRN: [id number] Date of Birth: [birth year] Referring Provider:  Sharion Balloon, FNP  Encounter Date: 02/15/2015      End of Session - 02/15/15 1733    Visit Number 2   Number of Visits 16   Date for SLP Re-Evaluation 03/29/15   Authorization Type UHC   SLP Start Time 1436   SLP Stop Time  1515   SLP Time Calculation (min) 39 min   Activity Tolerance Patient tolerated treatment well      Past Medical History  Diagnosis Date  . Hypertension   . Seizures   . Thyroid nodule   . Knee pain     left meniscus tear s/p repair 11/2014  . Stuttering     Past Surgical History  Procedure Laterality Date  . Tubal ligation    . Wisdom tooth extraction    . Knee arthroscopy with medial menisectomy Left 12/09/2014    Procedure: KNEE ARTHROSCOPY WITH PARTIAL MEDIAL AND LATERAL MENISECTOMY;  Surgeon: Sanjuana Kava, MD;  Location: [facility];  Service: Orthopedics;  Laterality: Left;    There were no vitals filed for this visit.  Visit Diagnosis: Speech dysfluency      Subjective Assessment - 02/15/15 1728    Subjective Pt accompanied by her daughter today and reports mild improvements in speech.   Patient is accompained by: Family member   Currently in Pain? No/denies               ADULT SLP TREATMENT - 02/15/15 1728    General Information   Behavior/Cognition Alert;Cooperative;Pleasant mood   Patient Positioning Upright in chair   Oral care provided N/A   HPI Brittany Durham is a 44 yo woman who presents with moderate to severe verbal dysfluency negatively impacting all verbal communication from the word-level up to conversation. Pt reports sudden onset after a possible vaso vagal response when she "passed out" after knee surgery. She has not yet followed up  with outpatient neurology, but has an appointment on September 29 with Dr. Krista Blue. EEG completed 12/24/2014 was normal. Pt was tearful throughout the evaluation and demonstrated mod/severe verbal dysfluency throughout. She was unable to hum, sing, and had difficulty with breath support.   Treatment Provided   Treatment provided Cognitive-Linquistic   Pain Assessment   Pain Assessment No/denies pain   Cognitive-Linquistic Treatment   Treatment focused on Voice;Other (comment);Patient/family/caregiver education  Dysfluency   Skilled Treatment Implementation of breath support exercises, fluency enhancing strategies, easy onset, continued phonation   Assessment / Recommendations / Plan   Plan Continue with current plan of care          SLP Education - 02/15/15 1732    Education Details Breath support and voice exercises; fluency enhancing strategies   Person(s) Educated Patient;Child(ren)   Methods Explanation;Demonstration;Handout   Comprehension Verbalized understanding;Returned demonstration;Verbal cues required;Need further instruction          SLP Short Term Goals - 02/15/15 1747    SLP SHORT TERM GOAL #1   Title Pt will imitate breathing and relaxation exercises in structured tasks with mi/mod cues    Baseline max   Time 4   Period Weeks   Status On-going   SLP SHORT TERM GOAL #2   Title Pt will implement fluency enhancing strategies in short phrases for 80% fluency  with mod cues.   Baseline 50%   Time 4   Period Weeks   Status On-going   SLP SHORT TERM GOAL #3   Title Pt will utilize continuous airflow technique on 75% of structured trials with mod cues.   Time 4   Period Weeks   Status On-going   SLP SHORT TERM GOAL #4   Title Further assess expressive language skills to see if there is an aphasic component to her deficits.   Time 4   Period Weeks   Status On-going          SLP Long Term Goals - 02/15/15 1748    SLP LONG TERM GOAL #1   Title Increase verbal  fluency to [facility] for basic conversations with use of compensatory strategies as needed.   Baseline mod/severe impairment   Time 8   Period Weeks   Status On-going          Plan - 02/15/15 1734    Clinical Impression Statement [name] was accompanied by her daughter today. She recently saw the neurologist, Dr. Krista Blue, who may refer Ms. Krikorian to ENT per pt report. SLP noted subjective improvement in verbal fluency while walking to treatment room, although still with noticeable dysfluencies. Pt was instructed on breathing techniques ("smell the flowers, blow out the candles") and reinforced diaphragmatic breathing with tactile cues of SLP hand over pt's hand on stomach. When success was achieved, pt was given scaffolded breathing and voicing exercises (slowly had voicing, easy onset with "hello", lip trill, and scales with /m/). SLP emphasized the importance of decreasing tension in lips, tongue, throat, and shoulders. The "confidential voice" was introduced and pt able to imitate effectively and with fluent speech on 2-syllable utterances. 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 This was discussed with the patient with SLP reinforcing that we needed to "trick our brains into remembering the right way to use our vocal cords". Pt became tearful when she was able to verbalize short utterances fluently, as did her daughter. Unfortunately, pt is unable to come tomorrow due to a physical therapy appointment. She was strongly encouraged to complete exercises at home until she returns again next week. She was also encouraged to focus on voicing during exhalation in her "confidential voice" and using only 2-3 words for expression. Continue POC. Pt demonstrated excellent gains this date.   Speech Therapy Frequency 2x / week   Duration --  8 weeks   Treatment/Interventions Compensatory techniques;Cueing hierarchy;SLP instruction and  feedback;Patient/family education;Compensatory strategies   Potential to Achieve Goals Fair   Potential Considerations Severity of impairments;Other (comment)  unknown onset/cause   SLP Home Exercise Plan Pt will complete HEP as assigned to facilitate carryover of treatment strategies and techniques in home/community setting.   Consulted and Agree with Plan of Care Patient        Problem List Patient Active Problem List   Diagnosis Date Noted  . Thyroid nodule 12/14/2014  . Left knee pain 12/14/2014  . Seizures ([place])   . Altered mental status 12/12/2014  . Syncope 12/12/2014  . Vitamin D deficiency 06/30/2014  . GERD (gastroesophageal reflux disease) 06/29/2014  . SYNCOPE 03/02/2010  . HYPERTENSION, BENIGN 03/01/2010   Thank you,  Genene Churn, [place]  [facility] 02/15/2015, 5:51 PM  [place] [address] [place], [place], [address] Phone: [phone]   Fax:  [phone]

## 2015-02-22 ENCOUNTER — Ambulatory Visit (HOSPITAL_COMMUNITY): Payer: 59 | Admitting: Speech Pathology

## 2015-02-22 ENCOUNTER — Telehealth (HOSPITAL_COMMUNITY): Payer: Self-pay | Admitting: Speech Pathology

## 2015-02-22 NOTE — Telephone Encounter (Signed)
SPEECH PATHOLOGY  I called pt due to "no show" for today's appointment scheduled for 2:30 PM and pt stated she thought the appointment was on Wednesday. I also alerted pt that her next scheduled appointment is this Thursday at 4 PM and she stated that she would be here.   Thank you,  Genene Churn, CCC-SLP [phone]

## 2015-02-24 ENCOUNTER — Ambulatory Visit (HOSPITAL_COMMUNITY): Payer: 59 | Admitting: Speech Pathology

## 2015-02-24 DIAGNOSIS — R4789 Other speech disturbances: Secondary | ICD-10-CM

## 2015-02-24 DIAGNOSIS — R479 Unspecified speech disturbances: Secondary | ICD-10-CM | POA: Diagnosis not present

## 2015-02-24 NOTE — Therapy (Addendum)
[place], [place], [address] Phone: [phone]   Fax:  [phone]  Speech Language Pathology Treatment  Patient Details  Name: Brittany Durham MRN: [id number] Date of Birth: 1970-11-18 No Data Recorded  Encounter Date: 02/24/2015      End of Session - 02/24/15 1729    Visit Number 3   Number of Visits 16   Date for SLP Re-Evaluation 03/29/15   Authorization Type UHC   SLP Start Time 1600   SLP Stop Time  1650   SLP Time Calculation (min) 50 min   Activity Tolerance Patient tolerated treatment well      Past Medical History  Diagnosis Date  . Hypertension   . Seizures   . Thyroid nodule   . Knee pain     left meniscus tear s/p repair 11/2014  . Stuttering     Past Surgical History  Procedure Laterality Date  . Tubal ligation    . Wisdom tooth extraction    . Knee arthroscopy with medial menisectomy Left 12/09/2014    Procedure: KNEE ARTHROSCOPY WITH PARTIAL MEDIAL AND LATERAL MENISECTOMY;  Surgeon: Sanjuana Kava, MD;  Location: [facility];  Service: Orthopedics;  Laterality: Left;    There were no vitals filed for this visit.  Visit Diagnosis: Speech dysfluency      Subjective Assessment - 02/24/15 1724    Subjective Pt accompanied by her daughter today and reports improvements in speech.   Patient is accompained by: Family member               ADULT SLP TREATMENT - 02/24/15 1724    General Information   Behavior/Cognition Alert;Cooperative;Pleasant mood   Patient Positioning Upright in chair   Oral care provided N/A   HPI 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 Pt reports sudden onset after a possible vaso vagal response when she "passed out" after knee surgery. She has not yet followed up with outpatient neurology, but has an appointment on September 29  with Dr. Krista Blue. EEG completed 12/24/2014 was normal. Pt was tearful throughout the evaluation and demonstrated mod/severe verbal dysfluency throughout. She was unable to hum, sing, and had difficulty with breath support.   Treatment Provided   Treatment provided Cognitive-Linquistic   Pain Assessment   Pain Assessment No/denies pain   Cognitive-Linquistic Treatment   Treatment focused on Voice;Other (comment);Patient/family/caregiver education   Skilled Treatment Implementation of breath support exercises, fluency enhancing strategies, easy onset, continued phonation   Assessment / Recommendations / Plan   Plan Continue with current plan of care          SLP Education - 02/24/15 1725    Education provided Yes   Education Details homework provided   [employer]) Educated Patient;Child(ren)   Methods Explanation;Demonstration;Handout   Comprehension Verbalized understanding;Verbal cues required          SLP Short Term Goals - 02/24/15 1739    SLP SHORT TERM GOAL #1   Title Pt will imitate breathing and relaxation exercises in structured tasks with mi/mod cues    Baseline max   Time 4   Period Weeks   Status On-going   SLP SHORT TERM GOAL #2   Title Pt will implement fluency enhancing strategies in short phrases for 80% fluency with mod cues.   Baseline 50%   Time 4   Period Weeks  Status On-going   SLP SHORT TERM GOAL #3   Title Pt will utilize continuous airflow technique on 75% of structured trials with mod cues.   Time 4   Period Weeks   Status On-going   SLP SHORT TERM GOAL #4   Title Further assess expressive language skills to see if there is an aphasic component to her deficits.   Time 4   Period Weeks   Status On-going          SLP Long Term Goals - 02/24/15 1739    SLP LONG TERM GOAL #1   Title Increase verbal fluency to [facility] for basic conversations with use of compensatory strategies as needed.   Baseline mod/severe impairment   Time 8   Period Weeks    Status On-going          Plan - 02/24/15 1729    Clinical Impression Statement [name] was accompanied by her daughter today. Both report improvements in fluent speech. She continues to practice at home. Session focused on proper breath support, easy onset, continuous voicing, and self repairing strategies. Pt also used the whisper phone during oral reading of sentences with good results. Although dysfluencies persist, speech was much more intelligible this session. She has difficulty with ending sounds, multisyllabic words, and /s/ words. Next session will focus on targeting these areas. Continue plan of care.   Speech Therapy Frequency 2x / week   Duration 4 weeks   Treatment/Interventions Compensatory techniques;Cueing hierarchy;SLP instruction and feedback;Patient/family education;Compensatory strategies   Potential to Achieve Goals Fair   Potential Considerations Severity of impairments;Other (comment)  unknown onset/cause   SLP Home Exercise Plan Pt will complete HEP as assigned to facilitate carryover of treatment strategies and techniques in home/community setting.   Consulted and Agree with Plan of Care Patient        Problem List Patient Active Problem List   Diagnosis Date Noted  . Thyroid nodule 12/14/2014  . Left knee pain 12/14/2014  . Seizures ([place])   . Altered mental status 12/12/2014  . Syncope 12/12/2014  . Vitamin D deficiency 06/30/2014  . GERD (gastroesophageal reflux disease) 06/29/2014  . SYNCOPE 03/02/2010  . HYPERTENSION, BENIGN 03/01/2010   SPEECH THERAPY DISCHARGE SUMMARY  Visits from Start of Care: 3  Current functional level related to goals / functional outcomes: Pt only attended 3 of 16 recommended visits.   Remaining deficits: As stated above   Education / Equipment: N/A Plan: Patient agrees to discharge.  Patient goals were partially met. Patient is being discharged due to not returning since the last visit.  ?????          Thank you,  Genene Churn, [place]  [facility] 02/24/2015, 5:40 PM  [place], [place], [address] Phone: [phone]   Fax:  [phone]   Name: Brittany Durham MRN: [id number] Date of Birth: 07-03-1970

## 2015-02-28 ENCOUNTER — Ambulatory Visit (HOSPITAL_COMMUNITY): Payer: 59 | Admitting: Speech Pathology

## 2015-02-28 ENCOUNTER — Ambulatory Visit: Payer: 59 | Attending: Orthopaedic Surgery | Admitting: Physical Therapy

## 2015-02-28 ENCOUNTER — Telehealth: Payer: Self-pay | Admitting: Family

## 2015-02-28 ENCOUNTER — Telehealth (HOSPITAL_COMMUNITY): Payer: Self-pay

## 2015-02-28 DIAGNOSIS — M259 Joint disorder, unspecified: Secondary | ICD-10-CM | POA: Diagnosis present

## 2015-02-28 DIAGNOSIS — R29898 Other symptoms and signs involving the musculoskeletal system: Secondary | ICD-10-CM

## 2015-02-28 DIAGNOSIS — M25662 Stiffness of left knee, not elsewhere classified: Secondary | ICD-10-CM | POA: Insufficient documentation

## 2015-02-28 DIAGNOSIS — M25462 Effusion, left knee: Secondary | ICD-10-CM | POA: Diagnosis present

## 2015-02-28 DIAGNOSIS — M25562 Pain in left knee: Secondary | ICD-10-CM | POA: Diagnosis present

## 2015-02-28 NOTE — Telephone Encounter (Signed)
Patient donot  Have transportation.

## 2015-02-28 NOTE — Therapy (Addendum)
[facility] [place], [place], [address] Phone: [phone]   Fax:  [phone]  Physical Therapy Treatment  Patient Details  Name: Brittany Durham MRN: [id number] Date of Birth: 1970/06/06 No Data Recorded  Encounter Date: 02/28/2015    Past Medical History:  Diagnosis Date  . Hypertension   . Knee pain    left meniscus tear s/p repair 11/2014  . Seizures ([place])   . Stuttering   . Thyroid nodule     Past Surgical History:  Procedure Laterality Date  . KNEE ARTHROSCOPY WITH MEDIAL MENISECTOMY Left 12/09/2014   Procedure: KNEE ARTHROSCOPY WITH PARTIAL MEDIAL AND LATERAL MENISECTOMY;  Surgeon: Sanjuana Kava, MD;  Location: [facility];  Service: Orthopedics;  Laterality: Left;  . TUBAL LIGATION    . WISDOM TOOTH EXTRACTION      There were no vitals filed for this visit.  Visit Diagnosis:  Knee stiffness, left  Left knee pain  Swelling of left knee joint  Ankle weakness                                    PT Long Term Goals - 02/28/15 1138      PT LONG TERM GOAL #1   Title Ind with HEP.   Time 6   Period Weeks   Status Achieved     PT LONG TERM GOAL #2   Title Active left knee flexion to 115 degrees+ so the patient can perform functional tasks and do so with pain not > 2-3/10.   Time 6   Period Weeks   Status Achieved     PT LONG TERM GOAL #3   Title Increase left  knee strength to a solid 4+/5 to provide good stability for accomplishment of functional activities   Time 6   Period Weeks   Status Achieved     PT LONG TERM GOAL #4   Title Normal gait pattern without assistive device.   Time 6   Period Weeks   Status On-going     PT LONG TERM GOAL #5   Title Perform ADL's with pain not > 3/10.   Time 6               Problem List Patient Active Problem List   Diagnosis Date Noted  . 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 Thyroid nodule 12/14/2014  . Left knee pain  12/14/2014  . Seizures ([place])   . Altered mental status 12/12/2014  . Syncope 12/12/2014  . Vitamin D deficiency 06/30/2014  . GERD (gastroesophageal reflux disease) 06/29/2014  . SYNCOPE 03/02/2010  . HYPERTENSION, BENIGN 03/01/2010   Madelyn Flavors PT  04/09/2016, 6:56 PM  [facility] [address] [place], [place], [address] Phone: [phone]   Fax:  [phone]  Name: Brittany Durham MRN: [id number] Date of Birth: 1970-05-21  PHYSICAL THERAPY DISCHARGE SUMMARY  Visits from Start of Care: 8.  Current functional level related to goals / functional outcomes: Please see above.   Remaining deficits: Continued left knee pain with ADL's.   Education / Equipment: HEP. Plan: Patient agrees to discharge.  Patient goals were partially met. Patient is being discharged due to being pleased with the current functional level.  ?????        Mali Applegate MPT

## 2015-02-28 NOTE — Patient Instructions (Signed)
Do Number 2, one time per day.

## 2015-02-28 NOTE — Telephone Encounter (Signed)
Ortho referral sent 

## 2015-03-07 ENCOUNTER — Encounter: Payer: 59 | Admitting: Physical Therapy

## 2015-03-10 ENCOUNTER — Ambulatory Visit (HOSPITAL_COMMUNITY): Payer: 59 | Admitting: Speech Pathology

## 2015-03-17 ENCOUNTER — Ambulatory Visit: Payer: 59 | Admitting: Neurology

## 2015-03-17 ENCOUNTER — Telehealth: Payer: Self-pay | Admitting: *Deleted

## 2015-03-17 NOTE — Telephone Encounter (Signed)
Called to cancel the morning of her follow up appt - she said her daughter is in the hospital.

## 2015-03-22 ENCOUNTER — Telehealth (HOSPITAL_COMMUNITY): Payer: Self-pay | Admitting: Speech Pathology

## 2015-03-22 ENCOUNTER — Ambulatory Visit (HOSPITAL_COMMUNITY): Payer: 59 | Attending: Family | Admitting: Speech Pathology

## 2015-03-22 NOTE — Telephone Encounter (Signed)
SPEECH PATHOLOGY  Telephone call to pt due to no show for today's appointment. There was no answer and no way to leave a voice mail. Pt also did not show for last week's appointment.   Thank you,  Genene Churn, CCC-SLP [phone]

## 2015-03-23 DIAGNOSIS — Z0271 Encounter for disability determination: Secondary | ICD-10-CM

## 2015-03-24 ENCOUNTER — Ambulatory Visit (HOSPITAL_COMMUNITY): Payer: 59 | Admitting: Speech Pathology

## 2015-03-28 ENCOUNTER — Ambulatory Visit (HOSPITAL_COMMUNITY): Payer: 59 | Admitting: Speech Pathology

## 2015-03-31 ENCOUNTER — Encounter: Payer: Self-pay | Admitting: Neurology

## 2015-03-31 ENCOUNTER — Ambulatory Visit (HOSPITAL_COMMUNITY): Payer: 59 | Admitting: Speech Pathology

## 2015-03-31 ENCOUNTER — Ambulatory Visit (INDEPENDENT_AMBULATORY_CARE_PROVIDER_SITE_OTHER): Payer: 59 | Admitting: Neurology

## 2015-03-31 VITALS — BP 126/86 | HR 84 | Resp 16 | Ht 67.0 in | Wt 199.1 lb

## 2015-03-31 DIAGNOSIS — R479 Unspecified speech disturbances: Secondary | ICD-10-CM | POA: Diagnosis not present

## 2015-03-31 NOTE — Progress Notes (Signed)
Chief Complaint  Patient presents with  . Language Impairment    Brittany Durham is here with her dtr. Brittany Durham for f/u of stuttering, which she sts. is about the same.  She has not been eval by ENT, as discussed at last ov./fim      PATIENT: Brittany Durham  Chief Complaint  Patient presents with  . Language Impairment    Brittany Durham is here with her dtr. Brittany Durham for f/u of stuttering, which she sts. is about the same.  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, she had left knee arthroscopic surgery under general anesthesia in December 09 2014 at [facility], initially she recovered very well  While changing her left knee wrap at home in July 30 first 2016, she complained of pain, started hyperventilation, had transient loss of consciousness, EMS was called, no seizure-like activity, she was noted to have difficulty talking, stuttering of the speech, he was taken to the emergency room, had extensive evaluations,  I have reviewed the record, EEG  and echocardiogram was normal in August 2016  I personally reviewed MRI/MRA of the brain that was normal, laboratory showed normal CMP, CBC, UDS was positive for opiates  Her speech overall has much improved, but not back to her baseline yet, she denies visual change, no arm weakness,   UPDATE Mar 31 2015: She continue have stuttering of the speech, was not evaluated by ENT, she complains of left knee pain, her left leg gave out, fell sometimes.   REVIEW OF SYSTEMS: Full 14 system review of systems performed and notable only for as above  ALLERGIES: No Known Allergies  HOME MEDICATIONS: Current Outpatient Prescriptions  Medication Sig Dispense Refill  . amLODipine  (NORVASC) 10 MG tablet Take 1 tablet (10 mg total) by mouth daily. 90 tablet 3  . HYDROcodone-acetaminophen (NORCO) 7.5-325 MG per tablet Take 1 tablet by mouth every 4 (four) hours as needed for moderate pain. 120 tablet 0  . ibuprofen (ADVIL,MOTRIN) 200 MG tablet Take 400 mg by mouth every 6 (six) hours as needed for moderate pain.    Marland Kitchen lisinopril-hydrochlorothiazide (ZESTORETIC) 20-12.5 MG per tablet Take 2 tablets by mouth daily. 180 tablet 4  . Vitamin D, Ergocalciferol, (DRISDOL) 50000 UNITS CAPS capsule Take 1 capsule (50,000 Units total) by mouth every 7 (seven) days. 12 capsule 3   No current facility-administered medications for this visit.    PAST MEDICAL HISTORY: Past Medical History  Diagnosis Date  . Hypertension   . Seizures ([place])   . Thyroid nodule   . Knee pain     left meniscus tear s/p repair 11/2014  . Stuttering     PAST SURGICAL HISTORY: Past Surgical History  Procedure Laterality Date  . Tubal ligation    . Wisdom tooth extraction    . Knee arthroscopy with medial menisectomy Left 12/09/2014    Procedure: KNEE ARTHROSCOPY WITH PARTIAL MEDIAL AND LATERAL MENISECTOMY;  Surgeon: Sanjuana Kava, MD;  Location: [facility];  Service: Orthopedics;  Laterality: Left;    FAMILY HISTORY: Family History  Problem Relation Age of Onset  . Hypertension Mother   . Hypertension Brother     SOCIAL HISTORY:  Social History  Social History  . Marital Status: Single    Spouse Name: N/A  . Number of Children: 1  . Years of Education: 12   Occupational History  . Umemployed    Social History Main Topics  . Smoking status: Never Smoker   . Smokeless tobacco: Never Used  . Alcohol Use: No  . Drug Use: No  . Sexual Activity: Yes    Birth Control/ Protection: Surgical   Other Topics Concern  . Not on file   Social History Narrative   Lives at home alone.   Right-handed.   Occasional caffeine use.     PHYSICAL EXAM   Filed Vitals:   03/31/15 0856  BP:  126/86  Pulse: 84  Resp: 16  Height: 5\' 7"  (1.702 m)  Weight: 199 lb 1.6 oz (90.311 kg)    Not recorded      Body mass index is 31.18 kg/(m^2).  PHYSICAL EXAMNIATION:  Gen: NAD, conversant, well nourised, obese, well groomed                     Cardiovascular: Regular rate rhythm, no peripheral edema, warm, nontender. Eyes: Conjunctivae clear without exudates or hemorrhage Neck: Supple, no carotid bruise. Pulmonary: Clear to auscultation bilaterally   NEUROLOGICAL EXAM:  MENTAL STATUS: Speech:    Deliberate speech, normal comprehension.  Cognition:     Orientation to time, place and person     Normal recent and remote memory     Normal Attention span and concentration     Normal Language, naming, repeating,spontaneous speech     Fund of knowledge   CRANIAL NERVES: CN II: Visual fields are full to confrontation. Fundoscopic exam is normal with sharp discs and no vascular changes. Pupils are round equal and briskly reactive to light. CN III, IV, VI: extraocular movement are normal. No ptosis. CN V: Facial sensation is intact to pinprick in all 3 divisions bilaterally. Corneal responses are intact.  CN VII: Face is symmetric with normal eye closure and smile. CN VIII: Hearing is normal to rubbing fingers CN IX, X: Palate elevates symmetrically. Phonation is normal. CN XI: Head turning and shoulder shrug are intact CN XII: Tongue is midline with normal movements and no atrophy.  MOTOR: There is no pronator drift of out-stretched arms. Muscle bulk and tone are normal. Muscle strength is normal.  REFLEXES: Reflexes are 2+ and symmetric at the biceps, triceps, knees, and ankles. Plantar responses are flexor.  SENSORY: Intact to light touch, pinprick, position sense, and vibration sense are intact in fingers and toes.  COORDINATION: Rapid alternating movements and fine finger movements are intact. There is no dysmetria on finger-to-nose and heel-knee-shin.     GAIT/STANCE: Antalgic gait due to left knee pain  DIAGNOSTIC DATA (LABS, IMAGING, TESTING) - I reviewed patient records, labs, notes, testing and imaging myself where available.   ASSESSMENT AND PLAN  Brittany Durham is a 44 y.o. female    Sudden onset stuttering of the speech, with normal MRI of the brain, MRA of the brain, normal EEG, Not sure exactly etiology, likely functional overlay Patient desires ENT evaluations, referred. She will only return for new issue.  Marcial Pacas, M.D. Ph.D.  [facility] [address] [place], [place] [address] Ph: [phone] Fax: [phone]  CC: Sharion Balloon, FNP

## 2015-03-31 NOTE — Addendum Note (Signed)
Addended by: Marcial Pacas on: 03/31/2015 09:14 AM   Modules accepted: Orders

## 2015-04-04 ENCOUNTER — Ambulatory Visit (HOSPITAL_COMMUNITY): Payer: 59 | Admitting: Speech Pathology

## 2015-04-11 ENCOUNTER — Ambulatory Visit (HOSPITAL_COMMUNITY): Payer: 59 | Admitting: Speech Pathology

## 2015-04-14 ENCOUNTER — Ambulatory Visit (HOSPITAL_COMMUNITY): Payer: 59 | Admitting: Speech Pathology

## 2015-04-24 ENCOUNTER — Emergency Department (HOSPITAL_COMMUNITY): Payer: 59

## 2015-04-24 ENCOUNTER — Emergency Department (HOSPITAL_COMMUNITY)
Admission: EM | Admit: 2015-04-24 | Discharge: 2015-04-24 | Disposition: A | Discharge status: Home / Self Care | Payer: 59 | Attending: Emergency Medicine | Admitting: Emergency Medicine

## 2015-04-24 ENCOUNTER — Encounter (HOSPITAL_COMMUNITY): Payer: Self-pay | Admitting: Emergency Medicine

## 2015-04-24 DIAGNOSIS — I10 Essential (primary) hypertension: Secondary | ICD-10-CM | POA: Insufficient documentation

## 2015-04-24 DIAGNOSIS — Z79899 Other long term (current) drug therapy: Secondary | ICD-10-CM | POA: Diagnosis not present

## 2015-04-24 DIAGNOSIS — Z8659 Personal history of other mental and behavioral disorders: Secondary | ICD-10-CM | POA: Insufficient documentation

## 2015-04-24 DIAGNOSIS — Z3202 Encounter for pregnancy test, result negative: Secondary | ICD-10-CM | POA: Insufficient documentation

## 2015-04-24 DIAGNOSIS — M545 Low back pain, unspecified: Secondary | ICD-10-CM

## 2015-04-24 DIAGNOSIS — R05 Cough: Secondary | ICD-10-CM | POA: Insufficient documentation

## 2015-04-24 DIAGNOSIS — Z87828 Personal history of other (healed) physical injury and trauma: Secondary | ICD-10-CM | POA: Diagnosis not present

## 2015-04-24 DIAGNOSIS — Z8639 Personal history of other endocrine, nutritional and metabolic disease: Secondary | ICD-10-CM | POA: Insufficient documentation

## 2015-04-24 LAB — URINALYSIS, ROUTINE W REFLEX MICROSCOPIC
Bilirubin Urine: NEGATIVE
Glucose, UA: NEGATIVE mg/dL
Ketones, ur: NEGATIVE mg/dL
Leukocytes, UA: NEGATIVE
Nitrite: NEGATIVE
Protein, ur: NEGATIVE mg/dL
Specific Gravity, Urine: 1.01 (ref 1.005–1.030)
pH: 6 (ref 5.0–8.0)

## 2015-04-24 LAB — URINE MICROSCOPIC-ADD ON: WBC, UA: NONE SEEN WBC/hpf (ref 0–5)

## 2015-04-24 LAB — POC URINE PREG, ED: Preg Test, Ur: NEGATIVE

## 2015-04-24 MED ORDER — ONDANSETRON 8 MG PO TBDP
8.0000 mg | ORAL_TABLET | Freq: Once | ORAL | Status: AC
  Administered 2015-04-24: 8 mg via ORAL
  Filled 2015-04-24: qty 1

## 2015-04-24 MED ORDER — MORPHINE SULFATE (PF) 4 MG/ML IV SOLN
6.0000 mg | Freq: Once | INTRAVENOUS | Status: AC
  Administered 2015-04-24: 6 mg via INTRAMUSCULAR
  Filled 2015-04-24: qty 2

## 2015-04-24 MED ORDER — KETOROLAC TROMETHAMINE 60 MG/2ML IM SOLN
60.0000 mg | Freq: Once | INTRAMUSCULAR | Status: AC
  Administered 2015-04-24: 60 mg via INTRAMUSCULAR
  Filled 2015-04-24: qty 2

## 2015-04-24 MED ORDER — OXYCODONE-ACETAMINOPHEN 5-325 MG PO TABS: 1.0000 | ORAL_TABLET | ORAL | Status: DC | PRN

## 2015-04-24 MED ORDER — CYCLOBENZAPRINE HCL 5 MG PO TABS: 5.0000 mg | ORAL_TABLET | Freq: Three times a day (TID) | ORAL | Status: DC | PRN

## 2015-04-24 NOTE — ED Provider Notes (Signed)
CSN: [id number]     Arrival date & time 04/24/15  1525 History  By signing my name below, I, Soijett Blue, attest that this documentation has been prepared under the direction and in the presence of Evalee Jefferson, PA-C Electronically Signed: Soijett Blue, ED Scribe. 04/24/2015. 4:39 PM.   Chief Complaint  Patient presents with  . Back Pain     The history is provided by the patient. No language interpreter was used.    HPI Comments: Brittany Durham is a 44 y.o. female with a medical hx of HTN who presents to the Emergency Department complaining of bilateral lower non-radiating back pain onset 2 days ago. She notes that she was sitting on the couch while watching tv when she began to have a sharp pain to her back. She denies ever having this pain in the past. She reports that laying on her back, coughing, and movement worsens her pain. She does not know of anything that alleviates her symptoms at this time. She reports that her last bowel movement was yesterday and nl and it was painful for her change positions to get on the toilet.   She states that she is having associated symptoms of intermittent non-productive cough, appetite change due to pain, and gait problem due to pain. She states that she has tried icy hot, Rx hydrocodone, and advil with no relief for her symptoms. She reports that she takes that hydrocodone for a knee surgery in December 09, 2014 and she has not been released from her surgeon care yet. She states that she has a follow up appointment with her surgeon on 04/27/2015. Pt denies bowel/bladder incontinence, difficulty with bowel movements, fever, urinary frequency, dysuria, hematuria, leg pain, SOB, abdominal pain, nausea, color change, rash, wound, and any other symptoms. Denies allergies to medications. Denies smoking history. She reports that she has been seen recently for a fall following her left knee surgery.    PCP: Dr. Evelina Dun.  Past Medical History  Diagnosis Date  .  Hypertension   . Seizures ([place])   . Thyroid nodule   . Knee pain     left meniscus tear s/p repair 11/2014  . Stuttering    Past Surgical History  Procedure Laterality Date  . Tubal ligation    . Wisdom tooth extraction    . Knee arthroscopy with medial menisectomy Left 12/09/2014    Procedure: KNEE ARTHROSCOPY WITH PARTIAL MEDIAL AND LATERAL MENISECTOMY;  Surgeon: Sanjuana Kava, MD;  Location: [facility];  Service: Orthopedics;  Laterality: Left;   Family History  Problem Relation Age of Onset  . Hypertension Mother   . Hypertension Brother    Social History  Substance Use Topics  . Smoking status: Never Smoker   . Smokeless tobacco: Never Used  . Alcohol Use: No   OB History    Gravida Para Term Preterm AB TAB SAB Ectopic Multiple Living   1 1 1       1      Review of Systems  Constitutional: Positive for appetite change (due to pain). Negative for fever.  Respiratory: Positive for cough. Negative for shortness of breath.   Cardiovascular: Negative for chest pain and leg swelling.  Gastrointestinal: Negative for nausea, abdominal pain, constipation and abdominal distention.  Genitourinary: Negative for dysuria, urgency, frequency, flank pain and difficulty urinating.  Musculoskeletal: Positive for back pain. Negative for joint swelling and gait problem.  Skin: Negative for color change, rash and wound.  Neurological: Negative for weakness and numbness.  Allergies  Review of patient's allergies indicates no known allergies.  Home Medications   Prior to Admission medications   Medication Sig Start Date End Date Taking? Authorizing Provider  amLODipine (NORVASC) 10 MG tablet Take 1 tablet (10 mg total) by mouth daily. 08/20/14   Sharion Balloon, FNP  cyclobenzaprine (FLEXERIL) 5 MG tablet Take 1 tablet (5 mg total) by mouth 3 (three) times daily as needed for muscle spasms. 04/24/15   Evalee Jefferson, PA-C  ibuprofen (ADVIL,MOTRIN) 200 MG tablet Take 400 mg by mouth every 6  (six) hours as needed for moderate pain.    Historical Provider, MD  lisinopril-hydrochlorothiazide (ZESTORETIC) 20-12.5 MG per tablet Take 2 tablets by mouth daily. 07/19/14   Sharion Balloon, FNP  oxyCODONE-acetaminophen (PERCOCET/ROXICET) 5-325 MG tablet Take 1 tablet by mouth every 4 (four) hours as needed. 04/24/15   Evalee Jefferson, PA-C  Vitamin D, Ergocalciferol, (DRISDOL) 50000 UNITS CAPS capsule TAKE 1 CAPSULE ONCE A WEEK 04/26/15   Sharion Balloon, FNP   BP 159/89 mmHg  Pulse 95  Temp(Src) 98.7 F (37.1 C) (Oral)  Resp 18  Ht 5\' 7"  (1.702 m)  Wt 89.359 kg  BMI 30.85 kg/m2  SpO2 100%  LMP 04/22/2015 Physical Exam  Constitutional: She appears well-developed and well-nourished.  HENT:  Head: Normocephalic.  Eyes: Conjunctivae are normal.  Neck: Normal range of motion. Neck supple.  Cardiovascular: Normal rate and intact distal pulses.   Pedal pulses normal.  Pulmonary/Chest: Effort normal. No respiratory distress. She has no wheezes. She has no rales. She exhibits no tenderness.  Abdominal: Soft. Bowel sounds are normal. She exhibits no distension and no mass.  No palpable mass.   Musculoskeletal: Normal range of motion. She exhibits no edema.       Lumbar back: She exhibits tenderness. She exhibits no swelling, no edema and no spasm.  Low lumbar midline and bilateral paralumbar tenderness without mass, spasm, or palpable deformity. Gait is nl.  Neurological: She is alert. She has normal strength. She displays no atrophy and no tremor. No sensory deficit. Gait normal.  Reflex Scores:      Patellar reflexes are 2+ on the right side and 2+ on the left side.      Achilles reflexes are 2+ on the right side and 2+ on the left side. No strength deficit noted in hip and knee flexor and extensor muscle groups.  Ankle flexion and extension intact.  Skin: Skin is warm and dry.  Psychiatric: She has a normal mood and affect.  Nursing note and vitals reviewed.   ED Course  Procedures  (including critical care time) DIAGNOSTIC STUDIES: Oxygen Saturation is 100% on RA, nl by my interpretation.    COORDINATION OF CARE: 4:35 PM Discussed treatment plan with pt at bedside which includes morphine injection, lumbar spine xray, and UA and pt agreed to plan.  6:32 PM- Pt reassessed and at re-check, pt pain was much improved after receiving IM morphine but states that it made her nauseated. Will treat pt with OTD zofran.   Labs Review Labs Reviewed  URINALYSIS, ROUTINE W REFLEX MICROSCOPIC (NOT AT [facility]) - Abnormal; Notable for the following:    Hgb urine dipstick SMALL (*)    All other components within normal limits  URINE MICROSCOPIC-ADD ON - Abnormal; Notable for the following:    Squamous Epithelial / LPF 6-30 (*)    Bacteria, UA RARE (*)    All other components within normal limits  POC URINE PREG, ED  Imaging Review Dg Lumbar Spine Complete  04/24/2015  CLINICAL DATA:  Low back pain starting 2 days ago. EXAM: LUMBAR SPINE - COMPLETE 4+ VIEW COMPARISON:  03/16/2011 FINDINGS: There is 8 degrees of levoconvex scoliosis between T12 and L5 the the. No subluxation or fracture. Disc spaces preserved. No significant spondylosis. IMPRESSION: 1.  No significant abnormality identified. Electronically Signed   By: Van Clines M.D.   On: 04/24/2015 18:23   I have personally reviewed and evaluated these images and lab results as part of my medical decision-making.   EKG Interpretation None      MDM   Final diagnoses:  Bilateral low back pain without sciatica    Patients  labs reviewed.  Radiological studies were viewed, interpreted and considered during the medical decision making and disposition process. I agree with radiologists reading.  Results were also discussed with patient. Pt prescribed oxycodone, flexeril, advised f/u with ortho as she has scheduled in 3 days.    No neuro deficit on exam or by history to suggest emergent or surgical presentation.  Also  discussed worsened sx that should prompt immediate re-evaluation including distal weakness, bowel/bladder retention/incontinence.  The patient appears reasonably screened and/or stabilized for discharge and I doubt any other medical condition or other [facility] requiring further screening, evaluation, or treatment in the ED at this time prior to discharge.    I personally performed the services described in this documentation, which was scribed in my presence. The recorded information has been reviewed and is accurate.   Evalee Jefferson, PA-C 04/26/15 1528  Merrily Pew, MD 04/27/15 1455

## 2015-04-24 NOTE — Discharge Instructions (Signed)
Back Pain, Adult °Back pain is very common in adults. The cause of back pain is rarely dangerous and the pain often gets better over time. The cause of your back pain may not be known. Some common causes of back pain include: °· Strain of the muscles or ligaments supporting the spine. °· Wear and tear (degeneration) of the spinal disks. °· Arthritis. °· Direct injury to the back. °For many people, back pain may return. Since back pain is rarely dangerous, most people can learn to manage this condition on their own. °HOME CARE INSTRUCTIONS °Watch your back pain for any changes. The following actions may help to lessen any discomfort you are feeling: °· Remain active. It is stressful on your back to sit or stand in one place for long periods of time. Do not sit, drive, or stand in one place for more than 30 minutes at a time. Take short walks on even surfaces as soon as you are able. Try to increase the length of time you walk each day. °· Exercise regularly as directed by your health care provider. Exercise helps your back heal faster. It also helps avoid future injury by keeping your muscles strong and flexible. °· Do not stay in bed. Resting more than 1-2 days can delay your recovery. °· Pay attention to your body when you bend and lift. The most comfortable positions are those that put less stress on your recovering back. Always use proper lifting techniques, including: °¨ Bending your knees. °¨ Keeping the load close to your body. °¨ Avoiding twisting. °· Find a comfortable position to sleep. Use a firm mattress and lie on your side with your knees slightly bent. If you lie on your back, put a pillow under your knees. °· Avoid feeling anxious or stressed. Stress increases muscle tension and can worsen back pain. It is important to recognize when you are anxious or stressed and learn ways to manage it, such as with exercise. °· Take medicines only as directed by your health care provider. Over-the-counter  medicines to reduce pain and inflammation are often the most helpful. Your health care provider may prescribe muscle relaxant drugs. These medicines help dull your pain so you can more quickly return to your normal activities and healthy exercise. °· Apply ice to the injured area: °¨ Put ice in a plastic bag. °¨ Place a towel between your skin and the bag. °¨ Leave the ice on for 20 minutes, 2-3 times a day for the first 2-3 days. After that, ice and heat may be alternated to reduce pain and spasms. °· Maintain a healthy weight. Excess weight puts extra stress on your back and makes it difficult to maintain good posture. °SEEK MEDICAL CARE IF: °· You have pain that is not relieved with rest or medicine. °· You have increasing pain going down into the legs or buttocks. °· You have pain that does not improve in one week. °· You have night pain. °· You lose weight. °· You have a fever or chills. °SEEK IMMEDIATE MEDICAL CARE IF:  °· You develop new bowel or bladder control problems. °· You have unusual weakness or numbness in your arms or legs. °· You develop nausea or vomiting. °· You develop abdominal pain. °· You feel faint. °  °This information is not intended to replace advice given to you by your health care provider. Make sure you discuss any questions you have with your health care provider. °  °Document Released: 04/30/2005 Document Revised: 05/21/2014 Document Reviewed: 09/01/2013 °Elsevier Interactive Patient Education ©2016 [employer]   Inc.    Do not drive within 4 hours of taking oxycodone as this will make you drowsy.  Avoid lifting,  Bending,  Twisting or any other activity that worsens your pain over the next week.  Apply a heating pad to your back 20 minutes 2-3 times daily.  You should get rechecked if your symptoms are not better over the next 5 days,  Or you develop increased pain,  Weakness in your leg(s) or loss of bladder or bowel function - these are symptoms of a worse injury.

## 2015-04-24 NOTE — ED Notes (Addendum)
Patient c/o lower back pain that started 2 days ago. Denies any injury and no hx of back pain. Patient tearful in triage from pain. Patient denies any urinary symptoms, incontinence, or difficulty with bowel movements. Per patient was sitting when pain started. Patient has been ambulatory with pain. Patient reports having period on the forth of this month, period stopping and then returning for two days which patient states is not normal.

## 2015-04-26 ENCOUNTER — Other Ambulatory Visit: Payer: Self-pay | Admitting: Family

## 2015-04-26 NOTE — Telephone Encounter (Signed)
Last level was 06/2014

## 2015-04-28 ENCOUNTER — Encounter: Payer: Self-pay | Admitting: Family

## 2015-04-28 ENCOUNTER — Ambulatory Visit (INDEPENDENT_AMBULATORY_CARE_PROVIDER_SITE_OTHER): Payer: 59 | Admitting: Family

## 2015-04-28 VITALS — BP 126/89 | HR 113 | Temp 97.8°F | Ht 67.0 in | Wt 204.0 lb

## 2015-04-28 DIAGNOSIS — R479 Unspecified speech disturbances: Secondary | ICD-10-CM

## 2015-04-28 MED ORDER — CYCLOBENZAPRINE HCL 5 MG PO TABS: 5.0000 mg | ORAL_TABLET | Freq: Three times a day (TID) | ORAL | Status: DC | PRN

## 2015-04-28 NOTE — Progress Notes (Signed)
   Subjective:    Patient ID: Brittany Durham, female    DOB: 11-22-1970, 44 y.o.   MRN: [id number]  HPI Pt presents to the office for speech disordered and wanted referral to ENT that was recommended by Neurologists. Pt had  had left knee arthroscopic surgery under general anesthesia in December 09 2014 at [facility] and recovered well.  While changing her left knee wrap at home in July 30 first 2016, she complained of pain, started hyperventilation, had transient loss of consciousness, EMS was called, no seizure-like activity, she was noted to have difficulty talking, stuttering of the speech, he was taken to the emergency room, had extensive evaluations. Her CMP, CBC, MRI/MRA were WNL. Pt's neurologists has cleared her and recommends she follows up with ENT. PT states her speech stuttering is unchanged.    Review of Systems  Constitutional: Negative.   HENT: Negative.   Eyes: Negative.   Respiratory: Negative.  Negative for shortness of breath.   Cardiovascular: Negative.  Negative for palpitations.  Gastrointestinal: Negative.   Endocrine: Negative.   Genitourinary: Negative.   Musculoskeletal: Negative.   Neurological: Negative.  Negative for headaches.  Hematological: Negative.   Psychiatric/Behavioral: Negative.   All other systems reviewed and are negative.      Objective:   Physical Exam  Constitutional: She is oriented to person, place, and time. She appears well-developed and well-nourished. No distress.  HENT:  Head: Normocephalic and atraumatic.  Right Ear: External ear normal.  Left Ear: External ear normal.  Nose: Nose normal.  Mouth/Throat: Oropharynx is clear and moist.  Eyes: Pupils are equal, round, and reactive to light.  Neck: Normal range of motion. Neck supple. No thyromegaly present.  Cardiovascular: Normal rate, regular rhythm, normal heart sounds and intact distal pulses.   No murmur heard. Pulmonary/Chest: Effort normal and breath sounds normal. No  respiratory distress. She has no wheezes.  Abdominal: Soft. Bowel sounds are normal. She exhibits no distension. There is no tenderness.  Musculoskeletal: Normal range of motion. She exhibits no edema or tenderness.  Neurological: She is alert and oriented to person, place, and time. She has normal reflexes. No cranial nerve deficit.  Skin: Skin is warm and dry.  Psychiatric: She has a normal mood and affect. Her behavior is normal. Judgment and thought content normal.  Stuttering speech present  Vitals reviewed.   BP 126/89 mmHg  Pulse 113  Temp(Src) 97.8 F (36.6 C) (Oral)  Ht 5\' 7"  (1.702 m)  Wt 204 lb (92.534 kg)  BMI 31.94 kg/m2  LMP 04/22/2015       Assessment & Plan:  1. Speech disorder -Neurologists cleared pt -Will see what ENT says? -RTO prn - Ambulatory referral to ENT - Ambulatory referral to [place], FNP

## 2015-04-28 NOTE — Addendum Note (Signed)
Addended by: Evelina Dun A on: 04/28/2015 10:52 AM   Modules accepted: Orders

## 2015-04-28 NOTE — Addendum Note (Signed)
Addended by: Evelina Dun A on: 04/28/2015 09:43 AM   Modules accepted: Orders

## 2015-04-28 NOTE — Patient Instructions (Signed)
Aphasia Aphasia is damage to the part of your brain that you need to communicate. For most people, that area is on the left side of the brain. Aphasia does not affect your intelligence, but you may struggle to talk, understand speech, read, or write. Aphasia can happen to anyone at any age, but it is most common in older age. CAUSES  An interruption of blood supply to the brain (stroke) is the most common cause of aphasia. Any disease or disorder that damages the communication areas of the brain can cause aphasia. This includes:   Brain tumors.  Brain injuries.  Brain infections.  Progressive diseases of the nervous system (neurological disorders). RISK FACTORS You may be at risk for aphasia if you have had any trauma, disease, or disorder that damaged the communication areas of the brain. SIGNS AND SYMPTOMS  Aphasia may start suddenly if it is caused by a stroke or brain injury. Aphasia caused by a tumor or a progressive neurological disorder may start gradually. The condition affects people differently. Signs and symptoms of aphasia include:  Trouble finding the right word.  Using the wrong words.  Talking in sentences that do not make sense.  Making up words.  Being unable to understand other people's speech.  Having problems writing, spelling, or reading.  Having trouble with numbers.  Having trouble swallowing. DIAGNOSIS  Your health care provider may suspect you have aphasia if you lose the ability to speak or understand language. You may need to see a specialist (speech and language pathologist) to help determine the diagnosis of aphasia. This person may do a series of tests to check your ability to:  Speak.  Express ideas.  Make conversation.  Understand speech.  Read and write. TREATMENT  In some cases, aphasia may improve on its own over time. Treatment for aphasia usually involves therapy with a pathologist. Your treatment will be designed to meet your needs  and abilities. Common treatments include:  Speech therapy.  Learning other ways to communicate.  Working with family members to find the best ways to communicate.  Working with an occupational therapist to find ways to communicate at work. HOME CARE INSTRUCTIONS  Keep all follow-up appointments.  Make sure you have a good support system at home.  The following techniques may be helpful while communicating:  Use short, simple sentences. Ask family members to do the same. Sentences that require one-word answers are easiest.  Avoid distractions like background noise when trying to listen or talk.  Try communicating with gestures, pointing, or drawing.  Talk slowly. Ask family members to talk to you slowly.  Maintain eye contact when communicating. SEEK MEDICAL CARE IF:  Your symptoms change or get worse.  You need more support at home.  You are struggling with anxiety or depression.  You develop trouble swallowing.   This information is not intended to replace advice given to you by your health care provider. Make sure you discuss any questions you have with your health care provider.   Document Released: 01/20/2002 Document Revised: 05/21/2014 Document Reviewed: 07/20/2013 Elsevier Interactive Patient Education [employer].

## 2015-05-26 ENCOUNTER — Other Ambulatory Visit: Payer: Self-pay | Admitting: Family

## 2015-06-22 ENCOUNTER — Ambulatory Visit: Payer: Self-pay | Attending: Family

## 2015-07-27 ENCOUNTER — Telehealth: Payer: Self-pay | Admitting: Family

## 2015-07-27 NOTE — Telephone Encounter (Signed)
Gave Sonja Bradford White's name at the [facility] center who should be able to help her out with this.

## 2015-11-28 ENCOUNTER — Other Ambulatory Visit: Payer: Self-pay | Admitting: Family

## 2015-11-29 NOTE — Telephone Encounter (Signed)
Patient's last labwork for Vitamin D was February 2016

## 2016-01-15 ENCOUNTER — Emergency Department (HOSPITAL_COMMUNITY)
Admission: EM | Admit: 2016-01-15 | Discharge: 2016-01-15 | Disposition: A | Discharge status: Home / Self Care | Payer: Medicaid Other | Attending: Emergency Medicine | Admitting: Emergency Medicine

## 2016-01-15 ENCOUNTER — Encounter (HOSPITAL_COMMUNITY): Payer: Self-pay

## 2016-01-15 ENCOUNTER — Emergency Department (HOSPITAL_COMMUNITY): Payer: Medicaid Other

## 2016-01-15 DIAGNOSIS — Z79899 Other long term (current) drug therapy: Secondary | ICD-10-CM | POA: Diagnosis not present

## 2016-01-15 DIAGNOSIS — I10 Essential (primary) hypertension: Secondary | ICD-10-CM | POA: Diagnosis not present

## 2016-01-15 DIAGNOSIS — R000 Tachycardia, unspecified: Secondary | ICD-10-CM | POA: Insufficient documentation

## 2016-01-15 DIAGNOSIS — R0789 Other chest pain: Secondary | ICD-10-CM | POA: Insufficient documentation

## 2016-01-15 DIAGNOSIS — R0602 Shortness of breath: Secondary | ICD-10-CM | POA: Insufficient documentation

## 2016-01-15 DIAGNOSIS — Z791 Long term (current) use of non-steroidal anti-inflammatories (NSAID): Secondary | ICD-10-CM | POA: Diagnosis not present

## 2016-01-15 DIAGNOSIS — R079 Chest pain, unspecified: Secondary | ICD-10-CM | POA: Diagnosis present

## 2016-01-15 LAB — BASIC METABOLIC PANEL
Anion gap: 8 (ref 5–15)
BUN: 13 mg/dL (ref 6–20)
CO2: 26 mmol/L (ref 22–32)
Calcium: 9.4 mg/dL (ref 8.9–10.3)
Chloride: 103 mmol/L (ref 101–111)
Creatinine, Ser: 0.7 mg/dL (ref 0.44–1.00)
GFR calc Af Amer: >60 mL/min (ref >60)
GFR calc non Af Amer: >60 mL/min (ref >60)
Glucose, Bld: 105 mg/dL — ABNORMAL HIGH (ref 65–99)
Potassium: 3.3 mmol/L — ABNORMAL LOW (ref 3.5–5.1)
Sodium: 137 mmol/L (ref 135–145)

## 2016-01-15 LAB — CBC WITH DIFFERENTIAL/PLATELET
Basophils Absolute: 0 10*3/uL (ref 0.0–0.1)
Basophils Relative: 0 %
Eosinophils Absolute: 0.1 10*3/uL (ref 0.0–0.7)
Eosinophils Relative: 2 %
HCT: 35.2 % — ABNORMAL LOW (ref 36.0–46.0)
Hemoglobin: 11.6 g/dL — ABNORMAL LOW (ref 12.0–15.0)
Lymphocytes Relative: 30 %
Lymphs Abs: 1.8 10*3/uL (ref 0.7–4.0)
MCH: 29 pg (ref 26.0–34.0)
MCHC: 33 g/dL (ref 30.0–36.0)
MCV: 88 fL (ref 78.0–100.0)
Monocytes Absolute: 0.5 10*3/uL (ref 0.1–1.0)
Monocytes Relative: 8 %
Neutro Abs: 3.6 10*3/uL (ref 1.7–7.7)
Neutrophils Relative %: 60 %
Platelets: 218 10*3/uL (ref 150–400)
RBC: 4 MIL/uL (ref 3.87–5.11)
RDW: 12.7 % (ref 11.5–15.5)
WBC: 6 10*3/uL (ref 4.0–10.5)

## 2016-01-15 LAB — D-DIMER, QUANTITATIVE (NOT AT ARMC): D-Dimer, Quant: 0.48 ug/mL-FEU (ref 0.00–0.50)

## 2016-01-15 LAB — TROPONIN I: Troponin I: <0.03 ng/mL (ref <0.03)

## 2016-01-15 MED ORDER — HYDROMORPHONE HCL 1 MG/ML IJ SOLN
0.5000 mg | Freq: Once | INTRAMUSCULAR | Status: AC
  Administered 2016-01-15: 0.5 mg via INTRAVENOUS
  Filled 2016-01-15: qty 1

## 2016-01-15 MED ORDER — HYDROCODONE-ACETAMINOPHEN 5-325 MG PO TABS: 1.0000 | ORAL_TABLET | Freq: Four times a day (QID) | ORAL | 0 refills | Status: DC | PRN

## 2016-01-15 NOTE — ED Triage Notes (Signed)
Pt reports chest pain that started last night.  Reports SOB.  Pt says pain is worse with movement.  Denies injury or strenuous activity.

## 2016-01-15 NOTE — ED Provider Notes (Deleted)
[facility] DEPT Provider Note   CSN: [id number] Arrival date & time: 01/15/16  1009     History   Chief Complaint Chief Complaint  Patient presents with  . Chest Pain    HPI Brittany Durham is a 45 y.o. female.  Patient complains of chest pain in the left side   The history is provided by the patient. No language interpreter was used.  Chest Pain   This is a recurrent problem. The current episode started 12 to 24 hours ago. The problem occurs constantly. The problem has not changed since onset.The pain is associated with movement. The pain is present in the lateral region. The pain is at a severity of 5/10. The pain is moderate. The quality of the pain is described as brief. The pain does not radiate. The symptoms are aggravated by deep breathing. Pertinent negatives include no abdominal pain, no back pain, no cough and no headaches.  Pertinent negatives for past medical history include no seizures.    Past Medical History:  Diagnosis Date  . Hypertension   . Knee pain    left meniscus tear s/p repair 11/2014  . Seizures ([place])   . Stuttering   . Thyroid nodule     Patient Active Problem List   Diagnosis Date Noted  . 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 Thyroid nodule 12/14/2014  . Left knee pain 12/14/2014  . Seizures ([place])   . Altered mental status 12/12/2014  . Syncope 12/12/2014  . Vitamin D deficiency 06/30/2014  . GERD (gastroesophageal reflux disease) 06/29/2014  . SYNCOPE 03/02/2010  . HYPERTENSION, BENIGN 03/01/2010    Past Surgical History:  Procedure Laterality Date  . KNEE ARTHROSCOPY WITH MEDIAL MENISECTOMY Left 12/09/2014   Procedure: KNEE ARTHROSCOPY WITH PARTIAL MEDIAL AND LATERAL MENISECTOMY;  Surgeon: Sanjuana Kava, MD;  Location: [facility];  Service: Orthopedics;  Laterality: Left;  . TUBAL LIGATION    . WISDOM TOOTH EXTRACTION      OB History    Gravida Para Term Preterm AB Living   1 1 1     1    SAB TAB Ectopic Multiple Live Births         Home Medications    Prior to Admission medications   Medication Sig Start Date End Date Taking? Authorizing Provider  amLODipine (NORVASC) 10 MG tablet Take 1 tablet (10 mg total) by mouth daily. 08/20/14  Yes Sharion Balloon, FNP  cyclobenzaprine (FLEXERIL) 5 MG tablet Take 1 tablet (5 mg total) by mouth 3 (three) times daily as needed for muscle spasms. 05/26/15  Yes Sharion Balloon, FNP  ibuprofen (ADVIL,MOTRIN) 200 MG tablet Take 400 mg by mouth every 6 (six) hours as needed for moderate pain.   Yes Historical Provider, MD  lisinopril-hydrochlorothiazide (ZESTORETIC) 20-12.5 MG per tablet Take 2 tablets by mouth daily. 07/19/14  Yes Sharion Balloon, FNP  Vitamin D, Ergocalciferol, (DRISDOL) 50000 units CAPS capsule TAKE 1 CAPSULE ONCE A WEEK 11/29/15  Yes Sharion Balloon, FNP  HYDROcodone-acetaminophen (NORCO/VICODIN) 5-325 MG tablet Take 1 tablet by mouth every 6 (six) hours as needed for moderate pain. 01/15/16   Milton Ferguson, MD    Family History Family History  Problem Relation Age of Onset  . Hypertension Mother   . Hypertension Brother     Social History Social History  Substance Use Topics  . Smoking status: Never Smoker  . Smokeless tobacco: Never Used  . Alcohol use No     Allergies   Review of patient's allergies  indicates no known allergies.   Review of Systems Review of Systems  Constitutional: Negative for appetite change and fatigue.  HENT: Negative for congestion, ear discharge and sinus pressure.   Eyes: Negative for discharge.  Respiratory: Negative for cough.   Cardiovascular: Positive for chest pain.  Gastrointestinal: Negative for abdominal pain and diarrhea.  Genitourinary: Negative for frequency and hematuria.  Musculoskeletal: Negative for back pain.  Skin: Negative for rash.  Neurological: Negative for seizures and headaches.  Psychiatric/Behavioral: Negative for hallucinations.     Physical Exam Updated Vital Signs BP 139/85   Pulse  89   Temp 98.3 F (36.8 C) (Oral)   Resp 17   Ht 5\' 7"  (1.702 m)   Wt 204 lb (92.5 kg)   LMP 12/31/2015   SpO2 99%   BMI 31.95 kg/m   Physical Exam  Constitutional: She is oriented to person, place, and time. She appears well-developed.  HENT:  Head: Normocephalic.  Eyes: Conjunctivae and EOM are normal. No scleral icterus.  Neck: Neck supple. No thyromegaly present.  Cardiovascular: Normal rate and regular rhythm.  Exam reveals no gallop and no friction rub.   No murmur heard. Tender left anterior  Pulmonary/Chest: No stridor. She has no wheezes. She has no rales. She exhibits no tenderness.  Abdominal: She exhibits no distension. There is no tenderness. There is no rebound.  Musculoskeletal: Normal range of motion. She exhibits no edema.  Lymphadenopathy:    She has no cervical adenopathy.  Neurological: She is oriented to person, place, and time. She exhibits normal muscle tone. Coordination normal.  Skin: No rash noted. No erythema.  Psychiatric: She has a normal mood and affect. Her behavior is normal.     ED Treatments / Results  Labs (all labs ordered are listed, but only abnormal results are displayed) Labs Reviewed  CBC WITH DIFFERENTIAL/PLATELET - Abnormal; Notable for the following:       Result Value   Hemoglobin 11.6 (*)    HCT 35.2 (*)    All other components within normal limits  BASIC METABOLIC PANEL - Abnormal; Notable for the following:    Potassium 3.3 (*)    Glucose, Bld 105 (*)    All other components within normal limits  TROPONIN I  D-DIMER, QUANTITATIVE (NOT AT [facility])    EKG  EKG Interpretation  Date/Time:  Sunday January 15 2016 10:15:37 EDT Ventricular Rate:  114 PR Interval:  140 QRS Duration: 80 QT Interval:  316 QTC Calculation: 435 R Axis:   5 Text Interpretation:  Sinus tachycardia Possible Left atrial enlargement Borderline ECG Confirmed by Suttyn Cryder  MD, Jamil Castillo [id number]) on 01/15/2016 11:47:35 AM       Radiology Dg Chest 2  View  Result Date: 01/15/2016 CLINICAL DATA:  Chest pain and shortness of breath. EXAM: CHEST  2 VIEW COMPARISON:  11/10/2010 FINDINGS: The heart size is normal. Stable tortuosity of the thoracic aorta. There is no evidence of pulmonary edema, consolidation, pneumothorax, nodule or pleural fluid. The visualized skeletal structures are unremarkable. IMPRESSION: No active cardiopulmonary disease. Electronically Signed   By: Aletta Edouard M.D.   On: 01/15/2016 10:36    Procedures Procedures (including critical care time)  Medications Ordered in ED Medications  HYDROmorphone (DILAUDID) injection 0.5 mg (0.5 mg Intravenous Given 01/15/16 1215)     Initial Impression / Assessment and Plan / ED Course  I have reviewed the triage vital signs and the nursing notes.  Pertinent labs & imaging results that were available during my  care of the patient were reviewed by me and considered in my medical decision making (see chart for details).  Clinical Course    EKG and labs unremarkable. The patient's diagnosis is chest wall pain. She is given some pain medicine will follow-up with her PCP  Final Clinical Impressions(s) / ED Diagnoses   Final diagnoses:  Chest wall pain    New Prescriptions New Prescriptions   HYDROCODONE-ACETAMINOPHEN (NORCO/VICODIN) 5-325 MG TABLET    Take 1 tablet by mouth every 6 (six) hours as needed for moderate pain.     Milton Ferguson, MD 01/15/16 1331

## 2016-01-15 NOTE — ED Provider Notes (Signed)
[facility] DEPT Provider Note   CSN: [id number] Arrival date & time: 01/15/16  1009  By signing my name below, I, Ephriam Jenkins, attest that this documentation has been prepared under the direction and in the presence of Milton Ferguson, MD. Electronically signed, Ephriam Jenkins, ED Scribe. 01/15/16. 12:07 PM.  History   Chief Complaint Chief Complaint  Patient presents with  . Chest Pain    HPI HPI Comments: Brittany Durham is a 45 y.o. female with a PMHx of HTN, who presents to the Emergency Department complaining of central chest pain that started last night. Pt is currently in the same pain as last night. She reports that the pain is exacerbated when taking a deep breath and with movement. Pt denies Hx of similar symptoms. Pt denies any recent injury or recent strenuous activity. No Hx of MI.   Patient complains of left-sided chest pain   The history is provided by the patient. No language interpreter was used.  Chest Pain   This is a new problem. The current episode started more than 2 days ago. The problem occurs constantly. The problem has not changed since onset.The pain is associated with movement. The pain is present in the lateral region. The pain is at a severity of 3/10. The pain is moderate. The quality of the pain is described as brief. The pain does not radiate. The symptoms are aggravated by deep breathing. Associated symptoms include shortness of breath. Pertinent negatives include no abdominal pain, no back pain, no cough and no headaches.  Pertinent negatives for past medical history include no seizures.    Past Medical History:  Diagnosis Date  . Hypertension   . Knee pain    left meniscus tear s/p repair 11/2014  . Seizures ([place])   . Stuttering   . Thyroid nodule     Patient Active Problem List   Diagnosis Date Noted  . 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 Thyroid nodule 12/14/2014  . Left knee pain 12/14/2014  . Seizures ([place])   . Altered mental status 12/12/2014    . Syncope 12/12/2014  . Vitamin D deficiency 06/30/2014  . GERD (gastroesophageal reflux disease) 06/29/2014  . SYNCOPE 03/02/2010  . HYPERTENSION, BENIGN 03/01/2010    Past Surgical History:  Procedure Laterality Date  . KNEE ARTHROSCOPY WITH MEDIAL MENISECTOMY Left 12/09/2014   Procedure: KNEE ARTHROSCOPY WITH PARTIAL MEDIAL AND LATERAL MENISECTOMY;  Surgeon: Sanjuana Kava, MD;  Location: [facility];  Service: Orthopedics;  Laterality: Left;  . TUBAL LIGATION    . WISDOM TOOTH EXTRACTION      OB History    Gravida Para Term Preterm AB Living   1 1 1     1    SAB TAB Ectopic Multiple Live Births                   Home Medications    Prior to Admission medications   Medication Sig Start Date End Date Taking? Authorizing Provider  amLODipine (NORVASC) 10 MG tablet Take 1 tablet (10 mg total) by mouth daily. 08/20/14  Yes Sharion Balloon, FNP  cyclobenzaprine (FLEXERIL) 5 MG tablet Take 1 tablet (5 mg total) by mouth 3 (three) times daily as needed for muscle spasms. 05/26/15  Yes Sharion Balloon, FNP  ibuprofen (ADVIL,MOTRIN) 200 MG tablet Take 400 mg by mouth every 6 (six) hours as needed for moderate pain.   Yes Historical Provider, MD  lisinopril-hydrochlorothiazide (ZESTORETIC) 20-12.5 MG per tablet Take 2 tablets by mouth daily. 07/19/14  Yes Sharion Balloon, FNP  Vitamin D, Ergocalciferol, (DRISDOL) 50000 units CAPS capsule TAKE 1 CAPSULE ONCE A WEEK 11/29/15  Yes Sharion Balloon, FNP    Family History Family History  Problem Relation Age of Onset  . Hypertension Mother   . Hypertension Brother     Social History Social History  Substance Use Topics  . Smoking status: Never Smoker  . Smokeless tobacco: Never Used  . Alcohol use No     Allergies   Review of patient's allergies indicates no known allergies.   Review of Systems Review of Systems  Constitutional: Negative for appetite change and fatigue.  HENT: Negative for congestion, ear discharge and sinus  pressure.   Eyes: Negative for discharge.  Respiratory: Positive for shortness of breath. Negative for cough.   Cardiovascular: Positive for chest pain.  Gastrointestinal: Negative for abdominal pain and diarrhea.  Genitourinary: Negative for frequency and hematuria.  Musculoskeletal: Negative for back pain.  Skin: Negative for rash.  Neurological: Negative for seizures and headaches.  Psychiatric/Behavioral: Negative for hallucinations.  All other systems reviewed and are negative.    Physical Exam Updated Vital Signs BP 138/87   Pulse 99   Temp 98.3 F (36.8 C) (Oral)   Resp 14   Ht 5\' 7"  (1.702 m)   Wt 204 lb (92.5 kg)   LMP 12/31/2015   SpO2 100%   BMI 31.95 kg/m   Physical Exam  Constitutional: She is oriented to person, place, and time. She appears well-developed.  HENT:  Head: Normocephalic.  Eyes: Conjunctivae and EOM are normal. No scleral icterus.  Neck: Neck supple. No thyromegaly present.  Cardiovascular: Regular rhythm.  Exam reveals no gallop and no friction rub.   No murmur heard. Tachycardic  Pulmonary/Chest: No stridor. She has no wheezes. She has no rales. She exhibits tenderness.  Tenderness over sternum  Abdominal: She exhibits no distension. There is no tenderness. There is no rebound.  Musculoskeletal: Normal range of motion. She exhibits no edema.  Lymphadenopathy:    She has no cervical adenopathy.  Neurological: She is oriented to person, place, and time. She exhibits normal muscle tone. Coordination normal.  Skin: No rash noted. No erythema.  Psychiatric: She has a normal mood and affect. Her behavior is normal.  Nursing note and vitals reviewed.    ED Treatments / Results  DIAGNOSTIC STUDIES: Oxygen Saturation is 100% on RA, normal by my interpretation.  COORDINATION OF CARE: 12:00 PM-Will order Dilaudid. Discussed treatment plan with pt at bedside and pt agreed to plan.   Labs (all labs ordered are listed, but only abnormal results  are displayed) Labs Reviewed  CBC WITH DIFFERENTIAL/PLATELET - Abnormal; Notable for the following:       Result Value   Hemoglobin 11.6 (*)    HCT 35.2 (*)    All other components within normal limits  BASIC METABOLIC PANEL - Abnormal; Notable for the following:    Potassium 3.3 (*)    Glucose, Bld 105 (*)    All other components within normal limits  TROPONIN I  D-DIMER, QUANTITATIVE (NOT AT [facility])    EKG  EKG Interpretation  Date/Time:  Sunday January 15 2016 10:15:37 EDT Ventricular Rate:  114 PR Interval:  140 QRS Duration: 80 QT Interval:  316 QTC Calculation: 435 R Axis:   5 Text Interpretation:  Sinus tachycardia Possible Left atrial enlargement Borderline ECG Confirmed by Symon Norwood  MD, Yuleimy Kretz [phone]) on 01/15/2016 11:47:35 AM       Radiology  Dg Chest 2 View  Result Date: 01/15/2016 CLINICAL DATA:  Chest pain and shortness of breath. EXAM: CHEST  2 VIEW COMPARISON:  11/10/2010 FINDINGS: The heart size is normal. Stable tortuosity of the thoracic aorta. There is no evidence of pulmonary edema, consolidation, pneumothorax, nodule or pleural fluid. The visualized skeletal structures are unremarkable. IMPRESSION: No active cardiopulmonary disease. Electronically Signed   By: Aletta Edouard M.D.   On: 01/15/2016 10:36    Procedures Procedures (including critical care time)  Medications Ordered in ED Medications  HYDROmorphone (DILAUDID) injection 0.5 mg (not administered)     Initial Impression / Assessment and Plan / ED Course  I have reviewed the triage vital signs and the nursing notes.  Pertinent labs & imaging results that were available during my care of the patient were reviewed by me and considered in my medical decision making (see chart for details).  Clinical Course    Patient with chest wall pain. Labs unremarkable. Patient will be given pain medicine will follow-up with PCP  Final Clinical Impressions(s) / ED Diagnoses   Final diagnoses:  None     New Prescriptions New Prescriptions   No medications on file  The chart was scribed for me under my direct supervision.  I personally performed the history, physical, and medical decision making and all procedures in the evaluation of this patient.Milton Ferguson, MD 01/15/16 [phone]

## 2016-01-15 NOTE — Discharge Instructions (Signed)
Follow up with your md later this week for recheck °

## 2016-01-19 ENCOUNTER — Ambulatory Visit (INDEPENDENT_AMBULATORY_CARE_PROVIDER_SITE_OTHER): Payer: Medicaid Other | Admitting: Family

## 2016-01-19 ENCOUNTER — Encounter: Payer: Self-pay | Admitting: Family

## 2016-01-19 VITALS — BP 123/82 | HR 100 | Temp 96.9°F | Ht 67.0 in | Wt 221.0 lb

## 2016-01-19 DIAGNOSIS — Z09 Encounter for follow-up examination after completed treatment for conditions other than malignant neoplasm: Secondary | ICD-10-CM | POA: Diagnosis not present

## 2016-01-19 DIAGNOSIS — S29011A Strain of muscle and tendon of front wall of thorax, initial encounter: Secondary | ICD-10-CM

## 2016-01-19 DIAGNOSIS — S29011D Strain of muscle and tendon of front wall of thorax, subsequent encounter: Secondary | ICD-10-CM

## 2016-01-19 NOTE — Patient Instructions (Signed)

## 2016-01-19 NOTE — Progress Notes (Signed)
   Subjective:    Patient ID: Brittany Durham, female    DOB: 13-Jan-1971, 45 y.o.   MRN: [id number]  HPI PT presents to the office today for hospital follow up for chest pain. Pt went to the ED on 01/15/16 and was diagnosed with chest wall inflammation. Pt was given rx of hydrocodone and states she had left hand swelling. Pt stopped the medication and the swelling resolved. PT had a negative EKG and blood work was negative. PT states the pain has resolved and denies any SOB, chest pain or swelling at this time.   [facility] notes reviewed.    Review of Systems  All other systems reviewed and are negative.      Objective:   Physical Exam  Constitutional: She is oriented to person, place, and time. She appears well-developed and well-nourished. No distress.  HENT:  Head: Normocephalic and atraumatic.  Eyes: Pupils are equal, round, and reactive to light.  Neck: Normal range of motion. Neck supple. No thyromegaly present.  Cardiovascular: Normal rate, regular rhythm, normal heart sounds and intact distal pulses.   No murmur heard. Pulmonary/Chest: Effort normal and breath sounds normal. No respiratory distress. She has no wheezes.  Abdominal: Soft. Bowel sounds are normal. She exhibits no distension. There is no tenderness.  Musculoskeletal: Normal range of motion. She exhibits no edema or tenderness.  Neurological: She is alert and oriented to person, place, and time.  Skin: Skin is warm and dry.  Psychiatric: She has a normal mood and affect. Her behavior is normal. Judgment and thought content normal.  Vitals reviewed.   BP 123/82   Pulse 100   Temp (!) 96.9 F (36.1 C) (Oral)   Ht 5\' 7"  (1.702 m)   Wt 221 lb (100.2 kg)   LMP 12/31/2015   BMI 34.61 kg/m        Assessment & Plan:  1. Chest wall muscle strain, subsequent encounter -Resolved  2. Hospital discharge follow-up  Discussed Rest, ice, and motrin prn  RTO prn    Evelina Dun, FNP

## 2016-01-20 ENCOUNTER — Ambulatory Visit: Payer: Self-pay | Admitting: Family

## 2016-02-24 ENCOUNTER — Encounter (HOSPITAL_COMMUNITY): Payer: Self-pay | Admitting: *Deleted

## 2016-02-24 ENCOUNTER — Emergency Department (HOSPITAL_COMMUNITY)
Admission: EM | Admit: 2016-02-24 | Discharge: 2016-02-24 | Disposition: A | Discharge status: Home / Self Care | Payer: Medicaid Other | Attending: Emergency Medicine | Admitting: Emergency Medicine

## 2016-02-24 ENCOUNTER — Emergency Department (HOSPITAL_COMMUNITY): Payer: Medicaid Other

## 2016-02-24 DIAGNOSIS — X58XXXA Exposure to other specified factors, initial encounter: Secondary | ICD-10-CM | POA: Diagnosis not present

## 2016-02-24 DIAGNOSIS — Z791 Long term (current) use of non-steroidal anti-inflammatories (NSAID): Secondary | ICD-10-CM | POA: Diagnosis not present

## 2016-02-24 DIAGNOSIS — Y929 Unspecified place or not applicable: Secondary | ICD-10-CM | POA: Diagnosis not present

## 2016-02-24 DIAGNOSIS — I10 Essential (primary) hypertension: Secondary | ICD-10-CM | POA: Insufficient documentation

## 2016-02-24 DIAGNOSIS — Y939 Activity, unspecified: Secondary | ICD-10-CM | POA: Diagnosis not present

## 2016-02-24 DIAGNOSIS — Y999 Unspecified external cause status: Secondary | ICD-10-CM | POA: Diagnosis not present

## 2016-02-24 DIAGNOSIS — M4716 Other spondylosis with myelopathy, lumbar region: Secondary | ICD-10-CM | POA: Insufficient documentation

## 2016-02-24 DIAGNOSIS — S3992XA Unspecified injury of lower back, initial encounter: Secondary | ICD-10-CM | POA: Diagnosis present

## 2016-02-24 DIAGNOSIS — Z79899 Other long term (current) drug therapy: Secondary | ICD-10-CM | POA: Diagnosis not present

## 2016-02-24 DIAGNOSIS — S39012A Strain of muscle, fascia and tendon of lower back, initial encounter: Secondary | ICD-10-CM

## 2016-02-24 DIAGNOSIS — M47896 Other spondylosis, lumbar region: Secondary | ICD-10-CM

## 2016-02-24 MED ORDER — ONDANSETRON HCL 4 MG PO TABS
4.0000 mg | ORAL_TABLET | Freq: Once | ORAL | Status: AC
  Administered 2016-02-24: 4 mg via ORAL
  Filled 2016-02-24: qty 1

## 2016-02-24 MED ORDER — METHOCARBAMOL 500 MG PO TABS: 500.0000 mg | ORAL_TABLET | Freq: Three times a day (TID) | ORAL | 0 refills | Status: DC

## 2016-02-24 MED ORDER — IBUPROFEN 800 MG PO TABS
800.0000 mg | ORAL_TABLET | Freq: Once | ORAL | Status: AC
  Administered 2016-02-24: 800 mg via ORAL
  Filled 2016-02-24: qty 1

## 2016-02-24 MED ORDER — DEXAMETHASONE 4 MG PO TABS: 4.0000 mg | ORAL_TABLET | Freq: Two times a day (BID) | ORAL | 0 refills | Status: DC

## 2016-02-24 MED ORDER — PREDNISONE 20 MG PO TABS
40.0000 mg | ORAL_TABLET | Freq: Once | ORAL | Status: AC
  Administered 2016-02-24: 40 mg via ORAL
  Filled 2016-02-24: qty 2

## 2016-02-24 MED ORDER — DIAZEPAM 5 MG PO TABS
10.0000 mg | ORAL_TABLET | Freq: Once | ORAL | Status: AC
  Administered 2016-02-24: 10 mg via ORAL
  Filled 2016-02-24: qty 2

## 2016-02-24 NOTE — ED Triage Notes (Signed)
Pt comes in with lower back pain that started last Friday when she woke up. She denies any injury to her back.  Pain worsens with movement.  She was seen at urgent care and given a shot. NAD noted. Pt is ambulatory.

## 2016-02-24 NOTE — ED Notes (Signed)
C/o lower back pain, rates pain 10/10.  Denies injury.

## 2016-02-24 NOTE — ED Provider Notes (Signed)
[facility] DEPT Provider Note   CSN: [id number] Arrival date & time: 02/24/16  1745     History   Chief Complaint Chief Complaint  Patient presents with  . Back Pain    HPI Brittany Durham is a 45 y.o. female.  The history is provided by the patient.  Back Pain   This is a new problem. The current episode started more than 1 week ago. The problem occurs daily. The problem has been gradually worsening. The pain is associated with no known injury. The pain is present in the lumbar spine. The quality of the pain is described as shooting and aching. The pain is at a severity of 10/10. The pain is moderate. The pain is the same all the time. Pertinent negatives include no chest pain, no fever, no abdominal pain, no dysuria and no tingling. She has tried nothing for the symptoms. The treatment provided no relief.    Past Medical History:  Diagnosis Date  . Hypertension   . Knee pain    left meniscus tear s/p repair 11/2014  . Seizures ([place])   . Stuttering   . Thyroid nodule     Patient Active Problem List   Diagnosis Date Noted  . 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 Thyroid nodule 12/14/2014  . Left knee pain 12/14/2014  . Seizures ([place])   . Altered mental status 12/12/2014  . Syncope 12/12/2014  . Vitamin D deficiency 06/30/2014  . GERD (gastroesophageal reflux disease) 06/29/2014  . SYNCOPE 03/02/2010  . HYPERTENSION, BENIGN 03/01/2010    Past Surgical History:  Procedure Laterality Date  . KNEE ARTHROSCOPY WITH MEDIAL MENISECTOMY Left 12/09/2014   Procedure: KNEE ARTHROSCOPY WITH PARTIAL MEDIAL AND LATERAL MENISECTOMY;  Surgeon: Sanjuana Kava, MD;  Location: [facility];  Service: Orthopedics;  Laterality: Left;  . TUBAL LIGATION    . WISDOM TOOTH EXTRACTION      OB History    Gravida Para Term Preterm AB Living   1 1 1     1    SAB TAB Ectopic Multiple Live Births                   Home Medications    Prior to Admission medications   Medication Sig Start Date End  Date Taking? Authorizing Provider  amLODipine (NORVASC) 10 MG tablet Take 1 tablet (10 mg total) by mouth daily. 08/20/14   Sharion Balloon, FNP  cyclobenzaprine (FLEXERIL) 5 MG tablet Take 1 tablet (5 mg total) by mouth 3 (three) times daily as needed for muscle spasms. 05/26/15   Sharion Balloon, FNP  HYDROcodone-acetaminophen (NORCO/VICODIN) 5-325 MG tablet Take 1 tablet by mouth every 6 (six) hours as needed for moderate pain. 01/15/16   Milton Ferguson, MD  ibuprofen (ADVIL,MOTRIN) 200 MG tablet Take 400 mg by mouth every 6 (six) hours as needed for moderate pain.    Historical Provider, MD  lisinopril-hydrochlorothiazide (ZESTORETIC) 20-12.5 MG per tablet Take 2 tablets by mouth daily. 07/19/14   Sharion Balloon, FNP  Vitamin D, Ergocalciferol, (DRISDOL) 50000 units CAPS capsule TAKE 1 CAPSULE ONCE A WEEK 11/29/15   Sharion Balloon, FNP    Family History Family History  Problem Relation Age of Onset  . Hypertension Mother   . Hypertension Brother     Social History Social History  Substance Use Topics  . Smoking status: Never Smoker  . Smokeless tobacco: Never Used  . Alcohol use No     Allergies   Review of patient's allergies indicates  no known allergies.   Review of Systems Review of Systems  Constitutional: Negative for activity change and fever.       All ROS Neg except as noted in HPI  HENT: Negative for nosebleeds.   Eyes: Negative for photophobia and discharge.  Respiratory: Negative for cough, shortness of breath and wheezing.   Cardiovascular: Negative for chest pain and palpitations.  Gastrointestinal: Negative for abdominal pain and blood in stool.  Genitourinary: Negative for dysuria, frequency and hematuria.  Musculoskeletal: Positive for back pain. Negative for arthralgias and neck pain.  Skin: Negative.   Neurological: Negative for dizziness, tingling, seizures and speech difficulty.  Psychiatric/Behavioral: Negative for confusion and hallucinations.  All other  systems reviewed and are negative.    Physical Exam Updated Vital Signs BP 165/98 (BP Location: Left Arm)   Pulse 108   Temp 97.9 F (36.6 C) (Oral)   Resp 18   Ht 5\' 7"  (1.702 m)   Wt 99.8 kg   LMP 02/17/2016   SpO2 100%   BMI 34.46 kg/m   Physical Exam  Constitutional: She is oriented to person, place, and time. She appears well-developed and well-nourished.  Non-toxic appearance.  HENT:  Head: Normocephalic.  Right Ear: Tympanic membrane and external ear normal.  Left Ear: Tympanic membrane and external ear normal.  Eyes: EOM and lids are normal. Pupils are equal, round, and reactive to light.  Neck: Normal range of motion. Neck supple. Carotid bruit is not present.  Cardiovascular: Normal rate, regular rhythm, normal heart sounds, intact distal pulses and normal pulses.   Pulmonary/Chest: Breath sounds normal. No respiratory distress.  Abdominal: Soft. Bowel sounds are normal. There is no tenderness. There is no guarding.  Musculoskeletal:       Left knee: She exhibits decreased range of motion. Tenderness found.       Lumbar back: She exhibits decreased range of motion, pain and spasm.  History of knee surgery in 11/2014.  Lymphadenopathy:       Head (right side): No submandibular adenopathy present.       Head (left side): No submandibular adenopathy present.    She has no cervical adenopathy.  Neurological: She is alert and oriented to person, place, and time. She has normal strength. No cranial nerve deficit or sensory deficit.  Skin: Skin is warm and dry.  Psychiatric: She has a normal mood and affect. Her speech is normal.  Nursing note and vitals reviewed.    ED Treatments / Results  Labs (all labs ordered are listed, but only abnormal results are displayed) Labs Reviewed - No data to display  EKG  EKG Interpretation None       Radiology No results found.  Procedures Procedures (including critical care time)  Medications Ordered in  ED Medications - No data to display   Initial Impression / Assessment and Plan / ED Course  I have reviewed the triage vital signs and the nursing notes.  Pertinent labs & imaging results that were available during my care of the patient were reviewed by me and considered in my medical decision making (see chart for details).  Clinical Course    **I have reviewed nursing notes, vital signs, and all appropriate lab and imaging results for this patient.*  Final Clinical Impressions(s) / ED Diagnoses  Her blood pressure is slightly elevated at 165/98. No gross neurologic deficit appreciated. The examination favors lumbar strain. There is also noted some osteoarthritis involving the lumbar spine area.  Patient referred to orthopedics. Prescription  for Robaxin and Decadron given to the patient. Patient will return to the emergency department sooner if any emergent changes, problems, or concerns.    Final diagnoses:  Strain of lumbar region, initial encounter  Other osteoarthritis of spine, lumbar region    New Prescriptions New Prescriptions   No medications on file     Lily Kocher, PA-C 02/26/16 [place], DO 02/28/16 1956

## 2016-02-24 NOTE — Discharge Instructions (Signed)
Your blood pressure is elevated at 165/98, otherwise your vital signs are within normal limits. The x-ray of your lumbar spine show some osteoarthritis changes in the lower back, no other abnormality on x-ray film. Please see Dr. Alvan Dame, or the orthopedic specialist of your choice for additional evaluation and management of your pain. Please use Robaxin 3 times daily for spasm pain, Decadron 2 times daily with food. Please continue the naproxen that you were given. Heating pad to your back maybe helpful.

## 2016-02-28 ENCOUNTER — Telehealth: Payer: Self-pay | Admitting: Family

## 2016-02-28 DIAGNOSIS — M25562 Pain in left knee: Secondary | ICD-10-CM

## 2016-02-28 NOTE — Telephone Encounter (Signed)
Please address

## 2016-03-02 ENCOUNTER — Telehealth: Payer: Self-pay | Admitting: Family

## 2016-03-02 DIAGNOSIS — M545 Low back pain: Secondary | ICD-10-CM

## 2016-03-02 NOTE — Telephone Encounter (Signed)
TC to pt she was seen in ED for low back pain needing referral for that not seen for left knee. New referral placed to Dr. Alvan Dame for lumbar pain

## 2016-04-09 ENCOUNTER — Emergency Department (HOSPITAL_COMMUNITY): Payer: Medicaid Other

## 2016-04-09 ENCOUNTER — Emergency Department (HOSPITAL_COMMUNITY)
Admission: EM | Admit: 2016-04-09 | Discharge: 2016-04-09 | Disposition: A | Discharge status: Home / Self Care | Payer: Medicaid Other | Attending: Emergency Medicine | Admitting: Emergency Medicine

## 2016-04-09 ENCOUNTER — Encounter (HOSPITAL_COMMUNITY): Payer: Self-pay | Admitting: Emergency Medicine

## 2016-04-09 DIAGNOSIS — Z79899 Other long term (current) drug therapy: Secondary | ICD-10-CM | POA: Insufficient documentation

## 2016-04-09 DIAGNOSIS — K5732 Diverticulitis of large intestine without perforation or abscess without bleeding: Secondary | ICD-10-CM

## 2016-04-09 DIAGNOSIS — I10 Essential (primary) hypertension: Secondary | ICD-10-CM | POA: Diagnosis not present

## 2016-04-09 DIAGNOSIS — R1032 Left lower quadrant pain: Secondary | ICD-10-CM | POA: Diagnosis present

## 2016-04-09 LAB — COMPREHENSIVE METABOLIC PANEL
ALT: 24 U/L (ref 14–54)
AST: 19 U/L (ref 15–41)
Albumin: 4.2 g/dL (ref 3.5–5.0)
Alkaline Phosphatase: 72 U/L (ref 38–126)
Anion gap: 8 (ref 5–15)
BUN: 15 mg/dL (ref 6–20)
CO2: 27 mmol/L (ref 22–32)
Calcium: 9.7 mg/dL (ref 8.9–10.3)
Chloride: 99 mmol/L — ABNORMAL LOW (ref 101–111)
Creatinine, Ser: 0.71 mg/dL (ref 0.44–1.00)
GFR calc Af Amer: >60 mL/min (ref >60)
GFR calc non Af Amer: >60 mL/min (ref >60)
Glucose, Bld: 97 mg/dL (ref 65–99)
Potassium: 3.3 mmol/L — ABNORMAL LOW (ref 3.5–5.1)
Sodium: 134 mmol/L — ABNORMAL LOW (ref 135–145)
Total Bilirubin: 0.5 mg/dL (ref 0.3–1.2)
Total Protein: 8.2 g/dL — ABNORMAL HIGH (ref 6.5–8.1)

## 2016-04-09 LAB — URINALYSIS, ROUTINE W REFLEX MICROSCOPIC
Bilirubin Urine: NEGATIVE
Glucose, UA: NEGATIVE mg/dL
Ketones, ur: NEGATIVE mg/dL
Nitrite: NEGATIVE
Protein, ur: NEGATIVE mg/dL
Specific Gravity, Urine: 1.01 (ref 1.005–1.030)
pH: 6 (ref 5.0–8.0)

## 2016-04-09 LAB — CBC
HCT: 34.6 % — ABNORMAL LOW (ref 36.0–46.0)
Hemoglobin: 11.2 g/dL — ABNORMAL LOW (ref 12.0–15.0)
MCH: 28.3 pg (ref 26.0–34.0)
MCHC: 32.4 g/dL (ref 30.0–36.0)
MCV: 87.4 fL (ref 78.0–100.0)
Platelets: 266 10*3/uL (ref 150–400)
RBC: 3.96 MIL/uL (ref 3.87–5.11)
RDW: 12.9 % (ref 11.5–15.5)
WBC: 9.1 10*3/uL (ref 4.0–10.5)

## 2016-04-09 LAB — URINE MICROSCOPIC-ADD ON

## 2016-04-09 LAB — POC URINE PREG, ED: Preg Test, Ur: NEGATIVE

## 2016-04-09 LAB — LIPASE, BLOOD: Lipase: 21 U/L (ref 11–51)

## 2016-04-09 MED ORDER — CIPROFLOXACIN HCL 250 MG PO TABS
500.0000 mg | ORAL_TABLET | Freq: Once | ORAL | Status: AC
  Administered 2016-04-09: 500 mg via ORAL
  Filled 2016-04-09: qty 2

## 2016-04-09 MED ORDER — SODIUM CHLORIDE 0.9 % IV SOLN
INTRAVENOUS | Status: DC
  Administered 2016-04-09: 21:00:00 via INTRAVENOUS

## 2016-04-09 MED ORDER — METRONIDAZOLE 500 MG PO TABS
500.0000 mg | ORAL_TABLET | Freq: Once | ORAL | Status: AC
  Administered 2016-04-09: 500 mg via ORAL
  Filled 2016-04-09: qty 1

## 2016-04-09 MED ORDER — ONDANSETRON HCL 4 MG/2ML IJ SOLN
4.0000 mg | Freq: Once | INTRAMUSCULAR | Status: AC
Start: 2016-04-09 — End: 2016-04-09
  Administered 2016-04-09: 4 mg via INTRAVENOUS
  Filled 2016-04-09: qty 2

## 2016-04-09 MED ORDER — CIPROFLOXACIN HCL 500 MG PO TABS: 500.0000 mg | ORAL_TABLET | Freq: Two times a day (BID) | ORAL | 0 refills | Status: DC

## 2016-04-09 MED ORDER — SODIUM CHLORIDE 0.9 % IV BOLUS (SEPSIS)
500.0000 mL | Freq: Once | INTRAVENOUS | Status: AC
  Administered 2016-04-09: 500 mL via INTRAVENOUS

## 2016-04-09 MED ORDER — HYDROMORPHONE HCL 1 MG/ML IJ SOLN
1.0000 mg | Freq: Once | INTRAMUSCULAR | Status: AC
  Administered 2016-04-09: 1 mg via INTRAVENOUS
  Filled 2016-04-09: qty 1

## 2016-04-09 MED ORDER — IOPAMIDOL (ISOVUE-300) INJECTION 61%
100.0000 mL | Freq: Once | INTRAVENOUS | Status: AC | PRN
  Administered 2016-04-09: 100 mL via INTRAVENOUS

## 2016-04-09 MED ORDER — HYDROCODONE-ACETAMINOPHEN 5-325 MG PO TABS: 1.0000 | ORAL_TABLET | Freq: Four times a day (QID) | ORAL | 0 refills | Status: DC | PRN

## 2016-04-09 MED ORDER — PROMETHAZINE HCL 25 MG PO TABS: 25.0000 mg | ORAL_TABLET | Freq: Four times a day (QID) | ORAL | 1 refills | Status: DC | PRN

## 2016-04-09 MED ORDER — IOPAMIDOL (ISOVUE-300) INJECTION 61%
INTRAVENOUS | Status: AC
  Administered 2016-04-09: 30 mL via ORAL
  Filled 2016-04-09: qty 30

## 2016-04-09 MED ORDER — METRONIDAZOLE 500 MG PO TABS: 500.0000 mg | ORAL_TABLET | Freq: Three times a day (TID) | ORAL | 0 refills | Status: DC

## 2016-04-09 NOTE — Discharge Instructions (Signed)
Take antibiotics as directed. Take the hydrocodone as needed for pain. Take Phenergan as needed for nausea and vomiting. Would recommend a bland diet for the next 2 days. Make an appointment to follow-up with your doctor. Return if not improving in 2 days or for any new or worse symptoms.

## 2016-04-09 NOTE — ED Provider Notes (Signed)
[facility] DEPT Provider Note   CSN: [id number] Arrival date & time: 04/09/16  1447     History   Chief Complaint Chief Complaint  Patient presents with  . Abdominal Pain    HPI Brittany Durham is a 45 y.o. female.  Patient onset yesterday with left lower quadrant abdominal pain. Not associated with nausea vomiting or diarrhea. No fevers. No dysuria. Patient states that the pain is 7 out of 10. Nonradiating. Sharp ache in nature. Patient has a history of speech impairment in the form of stuttering.      Past Medical History:  Diagnosis Date  . Hypertension   . Knee pain    left meniscus tear s/p repair 11/2014  . Seizures ([place])   . Stuttering   . Thyroid nodule     Patient Active Problem List   Diagnosis Date Noted  . 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 Thyroid nodule 12/14/2014  . Left knee pain 12/14/2014  . Seizures ([place])   . Altered mental status 12/12/2014  . Syncope 12/12/2014  . Vitamin D deficiency 06/30/2014  . GERD (gastroesophageal reflux disease) 06/29/2014  . SYNCOPE 03/02/2010  . HYPERTENSION, BENIGN 03/01/2010    Past Surgical History:  Procedure Laterality Date  . KNEE ARTHROSCOPY WITH MEDIAL MENISECTOMY Left 12/09/2014   Procedure: KNEE ARTHROSCOPY WITH PARTIAL MEDIAL AND LATERAL MENISECTOMY;  Surgeon: Sanjuana Kava, MD;  Location: [facility];  Service: Orthopedics;  Laterality: Left;  . TUBAL LIGATION    . WISDOM TOOTH EXTRACTION      OB History    Gravida Para Term Preterm AB Living   1 1 1     1    SAB TAB Ectopic Multiple Live Births                   Home Medications    Prior to Admission medications   Medication Sig Start Date End Date Taking? Authorizing Provider  amLODipine (NORVASC) 10 MG tablet Take 1 tablet (10 mg total) by mouth daily. 08/20/14  Yes Sharion Balloon, FNP  HYDROcodone-acetaminophen (NORCO/VICODIN) 5-325 MG tablet Take 1 tablet by mouth every 6 (six) hours as needed for moderate pain. 01/15/16  Yes Milton Ferguson, MD    lisinopril-hydrochlorothiazide (ZESTORETIC) 20-12.5 MG per tablet Take 2 tablets by mouth daily. 07/19/14  Yes Sharion Balloon, FNP  Vitamin D, Ergocalciferol, (DRISDOL) 50000 units CAPS capsule TAKE 1 CAPSULE ONCE A WEEK 11/29/15  Yes Sharion Balloon, FNP  ciprofloxacin (CIPRO) 500 MG tablet Take 1 tablet (500 mg total) by mouth 2 (two) times daily. 04/09/16   Fredia Sorrow, MD  dexamethasone (DECADRON) 4 MG tablet Take 1 tablet (4 mg total) by mouth 2 (two) times daily with a meal. Patient not taking: Reported on 04/09/2016 02/24/16   Lily Kocher, PA-C  HYDROcodone-acetaminophen (NORCO/VICODIN) 5-325 MG tablet Take 1-2 tablets by mouth every 6 (six) hours as needed. 04/09/16   Fredia Sorrow, MD  methocarbamol (ROBAXIN) 500 MG tablet Take 1 tablet (500 mg total) by mouth 3 (three) times daily. Patient not taking: Reported on 04/09/2016 02/24/16   Lily Kocher, PA-C  metroNIDAZOLE (FLAGYL) 500 MG tablet Take 1 tablet (500 mg total) by mouth 3 (three) times daily. 04/09/16   Fredia Sorrow, MD  promethazine (PHENERGAN) 25 MG tablet Take 1 tablet (25 mg total) by mouth every 6 (six) hours as needed. 04/09/16   Fredia Sorrow, MD    Family History Family History  Problem Relation Age of Onset  . Hypertension Mother   .  Hypertension Brother     Social History Social History  Substance Use Topics  . Smoking status: Never Smoker  . Smokeless tobacco: Never Used  . Alcohol use No     Allergies   Patient has no known allergies.   Review of Systems Review of Systems  Constitutional: Negative for fever.  HENT: Negative for congestion.   Eyes: Negative for redness.  Respiratory: Negative for shortness of breath.   Cardiovascular: Negative for chest pain.  Gastrointestinal: Positive for abdominal pain. Negative for diarrhea, nausea and vomiting.  Genitourinary: Negative for dysuria.  Musculoskeletal: Negative for back pain.  Skin: Negative for rash.  Neurological: Positive for  speech difficulty.  Hematological: Does not bruise/bleed easily.  Psychiatric/Behavioral: Negative for confusion.     Physical Exam Updated Vital Signs BP 125/65   Pulse 85   Temp 98.6 F (37 C) (Oral)   Resp 20   Ht 5\' 7"  (1.702 m)   Wt 97.5 kg   LMP 03/31/2016 Comment: NEG U PREG 04/09/16  SpO2 93%   BMI 33.67 kg/m   Physical Exam  Constitutional: She is oriented to person, place, and time. She appears well-developed and well-nourished. No distress.  HENT:  Head: Normocephalic and atraumatic.  Mouth/Throat: Oropharynx is clear and moist.  Eyes: Conjunctivae and EOM are normal. Pupils are equal, round, and reactive to light.  Neck: Normal range of motion.  Cardiovascular: Normal rate and regular rhythm.   Pulmonary/Chest: Effort normal and breath sounds normal. No respiratory distress.  Abdominal: Soft. Bowel sounds are normal. She exhibits no mass. There is tenderness. There is no guarding.  Tenderness left lower quadrant  Musculoskeletal: Normal range of motion. She exhibits no edema.  Neurological: She is alert and oriented to person, place, and time. No cranial nerve deficit or sensory deficit. She exhibits normal muscle tone. Coordination normal.  Has a speech stuttering impairment.  Skin: Skin is warm.  Nursing note and vitals reviewed.    ED Treatments / Results  Labs (all labs ordered are listed, but only abnormal results are displayed) Labs Reviewed  COMPREHENSIVE METABOLIC PANEL - Abnormal; Notable for the following:       Result Value   Sodium 134 (*)    Potassium 3.3 (*)    Chloride 99 (*)    Total Protein 8.2 (*)    All other components within normal limits  CBC - Abnormal; Notable for the following:    Hemoglobin 11.2 (*)    HCT 34.6 (*)    All other components within normal limits  URINALYSIS, ROUTINE W REFLEX MICROSCOPIC (NOT AT [facility]) - Abnormal; Notable for the following:    Hgb urine dipstick TRACE (*)    Leukocytes, UA SMALL (*)    All  other components within normal limits  URINE MICROSCOPIC-ADD ON - Abnormal; Notable for the following:    Squamous Epithelial / LPF 0-5 (*)    Bacteria, UA FEW (*)    All other components within normal limits  LIPASE, BLOOD  POC URINE PREG, ED    EKG  EKG Interpretation None       Radiology Ct Abdomen Pelvis W Contrast  Result Date: 04/09/2016 CLINICAL DATA:  Left lower quadrant pain for 2 days EXAM: CT ABDOMEN AND PELVIS WITH CONTRAST TECHNIQUE: Multidetector CT imaging of the abdomen and pelvis was performed using the standard protocol following bolus administration of intravenous contrast. CONTRAST:  97mL ISOVUE-300 IOPAMIDOL (ISOVUE-300) INJECTION 61%, 154mL ISOVUE-300 IOPAMIDOL (ISOVUE-300) INJECTION 61% COMPARISON:  03/16/2011 FINDINGS: Lower  chest: Lung bases demonstrate patchy dependent atelectasis. No consolidation or pleural effusion. No significant pericardial effusion. Hepatobiliary: No focal liver abnormality is seen. No gallstones, gallbladder wall thickening, or biliary dilatation. Pancreas: Unremarkable. No pancreatic ductal dilatation or surrounding inflammatory changes. Spleen: Normal in size without focal abnormality. Adrenals/Urinary Tract: Adrenal glands are within normal limits. Multiple punctate nonobstructing stones within the bilateral kidneys. Bladder is unremarkable. Stomach/Bowel: There is no dilated large or small bowel. Numerous colon diverticula are present. Questionable wall thickening versus underdistention of the sigmoid colon. There is mild edema and inflammatory change in the left lower quadrant mesentery. On the coronal views, inflammatory change surrounds a fat containing structure with central increased density. Appendix is visualized and is normal. Vascular/Lymphatic: Non aneurysmal aorta. No abnormally enlarged abdominal or pelvic lymph nodes. Reproductive: The uterus is enlarged and contains multiple masses, some with calcifications, consistent with  fibroids. Dominant calcified mass measures approximately 6 by 5.5 cm. No adnexal mass is seen. Other: No free air or free fluid. Musculoskeletal: No acute or significant osseous findings. IMPRESSION: 1. Mild inflammatory process within the left lower quadrant of the abdomen, imaging features are suggestive of an acute epiploic appendagitis. Mild acute diverticulitis is also included in the differential given numerous diverticula in the region and questionable bowel wall thickening versus underdistended bowel. There is no perforation or abscess. 2. Enlarged uterus containing multiple fibroids, some of which are calcified. Electronically Signed   By: Donavan Foil M.D.   On: 04/09/2016 22:13    Procedures Procedures (including critical care time)  Medications Ordered in ED Medications  0.9 %  sodium chloride infusion ( Intravenous Stopped 04/09/16 2230)  sodium chloride 0.9 % bolus 500 mL (0 mLs Intravenous Stopped 04/09/16 2122)  ondansetron (ZOFRAN) injection 4 mg (4 mg Intravenous Given 04/09/16 1953)  HYDROmorphone (DILAUDID) injection 1 mg (1 mg Intravenous Given 04/09/16 1953)  iopamidol (ISOVUE-300) 61 % injection (30 mLs Oral Contrast Given 04/09/16 2123)  iopamidol (ISOVUE-300) 61 % injection 100 mL (100 mLs Intravenous Contrast Given 04/09/16 2124)  ciprofloxacin (CIPRO) tablet 500 mg (500 mg Oral Given 04/09/16 2229)  metroNIDAZOLE (FLAGYL) tablet 500 mg (500 mg Oral Given 04/09/16 2229)     Initial Impression / Assessment and Plan / ED Course  I have reviewed the triage vital signs and the nursing notes.  Pertinent labs & imaging results that were available during my care of the patient were reviewed by me and considered in my medical decision making (see chart for details).  Clinical Course     CT scan raises concern for diverticulitis without any complicating factors. Therefore we'll go ahead and treat with Cipro and Flagyl. Patient nontoxic no acute distress. No significant  leukocytosis no fevers. So will treat just with oral antibiotics.    Final Clinical Impressions(s) / ED Diagnoses   Final diagnoses:  Diverticulitis of large intestine without perforation or abscess without bleeding    New Prescriptions New Prescriptions   CIPROFLOXACIN (CIPRO) 500 MG TABLET    Take 1 tablet (500 mg total) by mouth 2 (two) times daily.   HYDROCODONE-ACETAMINOPHEN (NORCO/VICODIN) 5-325 MG TABLET    Take 1-2 tablets by mouth every 6 (six) hours as needed.   METRONIDAZOLE (FLAGYL) 500 MG TABLET    Take 1 tablet (500 mg total) by mouth 3 (three) times daily.   PROMETHAZINE (PHENERGAN) 25 MG TABLET    Take 1 tablet (25 mg total) by mouth every 6 (six) hours as needed.     Fredia Sorrow, MD  04/09/16 2242  

## 2016-04-09 NOTE — ED Triage Notes (Signed)
Pt reports LLQ pain that started yesterday. Pt denies other symptoms.

## 2016-05-02 ENCOUNTER — Inpatient Hospital Stay (HOSPITAL_COMMUNITY)
Admission: EM | Admit: 2016-05-02 | Discharge: 2016-05-04 | DRG: 378 | Disposition: A | Discharge status: Home / Self Care | Payer: Medicaid Other | Attending: Family Medicine | Admitting: Family Medicine

## 2016-05-02 ENCOUNTER — Emergency Department (HOSPITAL_COMMUNITY): Payer: Medicaid Other

## 2016-05-02 ENCOUNTER — Encounter (HOSPITAL_COMMUNITY): Payer: Self-pay | Admitting: Emergency Medicine

## 2016-05-02 DIAGNOSIS — M549 Dorsalgia, unspecified: Secondary | ICD-10-CM | POA: Diagnosis present

## 2016-05-02 DIAGNOSIS — R000 Tachycardia, unspecified: Secondary | ICD-10-CM | POA: Diagnosis present

## 2016-05-02 DIAGNOSIS — K921 Melena: Principal | ICD-10-CM | POA: Diagnosis present

## 2016-05-02 DIAGNOSIS — I10 Essential (primary) hypertension: Secondary | ICD-10-CM | POA: Diagnosis present

## 2016-05-02 DIAGNOSIS — D62 Acute posthemorrhagic anemia: Secondary | ICD-10-CM | POA: Diagnosis present

## 2016-05-02 DIAGNOSIS — R4781 Slurred speech: Secondary | ICD-10-CM | POA: Diagnosis not present

## 2016-05-02 DIAGNOSIS — K219 Gastro-esophageal reflux disease without esophagitis: Secondary | ICD-10-CM | POA: Diagnosis present

## 2016-05-02 DIAGNOSIS — N946 Dysmenorrhea, unspecified: Secondary | ICD-10-CM | POA: Diagnosis present

## 2016-05-02 DIAGNOSIS — Z9851 Tubal ligation status: Secondary | ICD-10-CM

## 2016-05-02 DIAGNOSIS — K922 Gastrointestinal hemorrhage, unspecified: Secondary | ICD-10-CM | POA: Diagnosis present

## 2016-05-02 DIAGNOSIS — K259 Gastric ulcer, unspecified as acute or chronic, without hemorrhage or perforation: Secondary | ICD-10-CM | POA: Diagnosis present

## 2016-05-02 DIAGNOSIS — R1013 Epigastric pain: Secondary | ICD-10-CM | POA: Diagnosis present

## 2016-05-02 DIAGNOSIS — Z79899 Other long term (current) drug therapy: Secondary | ICD-10-CM

## 2016-05-02 DIAGNOSIS — K319 Disease of stomach and duodenum, unspecified: Secondary | ICD-10-CM | POA: Diagnosis present

## 2016-05-02 DIAGNOSIS — R479 Unspecified speech disturbances: Secondary | ICD-10-CM | POA: Diagnosis present

## 2016-05-02 DIAGNOSIS — Z9889 Other specified postprocedural states: Secondary | ICD-10-CM | POA: Diagnosis not present

## 2016-05-02 DIAGNOSIS — G8929 Other chronic pain: Secondary | ICD-10-CM | POA: Diagnosis present

## 2016-05-02 DIAGNOSIS — K449 Diaphragmatic hernia without obstruction or gangrene: Secondary | ICD-10-CM | POA: Diagnosis present

## 2016-05-02 DIAGNOSIS — R109 Unspecified abdominal pain: Secondary | ICD-10-CM

## 2016-05-02 DIAGNOSIS — Z8249 Family history of ischemic heart disease and other diseases of the circulatory system: Secondary | ICD-10-CM | POA: Diagnosis not present

## 2016-05-02 DIAGNOSIS — K3189 Other diseases of stomach and duodenum: Secondary | ICD-10-CM | POA: Diagnosis not present

## 2016-05-02 LAB — PREGNANCY, URINE: Preg Test, Ur: NEGATIVE

## 2016-05-02 LAB — COMPREHENSIVE METABOLIC PANEL
ALT: 25 U/L (ref 14–54)
AST: 18 U/L (ref 15–41)
Albumin: 3.9 g/dL (ref 3.5–5.0)
Alkaline Phosphatase: 61 U/L (ref 38–126)
Anion gap: 8 (ref 5–15)
BUN: 14 mg/dL (ref 6–20)
CO2: 27 mmol/L (ref 22–32)
Calcium: 9.4 mg/dL (ref 8.9–10.3)
Chloride: 101 mmol/L (ref 101–111)
Creatinine, Ser: 0.65 mg/dL (ref 0.44–1.00)
GFR calc Af Amer: >60 mL/min (ref >60)
GFR calc non Af Amer: >60 mL/min (ref >60)
Glucose, Bld: 101 mg/dL — ABNORMAL HIGH (ref 65–99)
Potassium: 3.7 mmol/L (ref 3.5–5.1)
Sodium: 136 mmol/L (ref 135–145)
Total Bilirubin: 0.6 mg/dL (ref 0.3–1.2)
Total Protein: 7.6 g/dL (ref 6.5–8.1)

## 2016-05-02 LAB — TYPE AND SCREEN
ABO/RH(D): A POS
Antibody Screen: NEGATIVE

## 2016-05-02 LAB — CBC
HCT: 30.4 % — ABNORMAL LOW (ref 36.0–46.0)
Hemoglobin: 9.8 g/dL — ABNORMAL LOW (ref 12.0–15.0)
MCH: 27.9 pg (ref 26.0–34.0)
MCHC: 32.2 g/dL (ref 30.0–36.0)
MCV: 86.6 fL (ref 78.0–100.0)
Platelets: 247 10*3/uL (ref 150–400)
RBC: 3.51 MIL/uL — ABNORMAL LOW (ref 3.87–5.11)
RDW: 12.9 % (ref 11.5–15.5)
WBC: 7.7 10*3/uL (ref 4.0–10.5)

## 2016-05-02 LAB — URINALYSIS, ROUTINE W REFLEX MICROSCOPIC
Bacteria, UA: NONE SEEN
Bilirubin Urine: NEGATIVE
Glucose, UA: NEGATIVE mg/dL
Ketones, ur: NEGATIVE mg/dL
Leukocytes, UA: NEGATIVE
Nitrite: NEGATIVE
Protein, ur: 30 mg/dL — AB
Specific Gravity, Urine: 1.025 (ref 1.005–1.030)
pH: 6 (ref 5.0–8.0)

## 2016-05-02 LAB — POC OCCULT BLOOD, ED: Fecal Occult Bld: POSITIVE — AB

## 2016-05-02 LAB — LIPASE, BLOOD: Lipase: 19 U/L (ref 11–51)

## 2016-05-02 MED ORDER — ONDANSETRON HCL 4 MG/2ML IJ SOLN
4.0000 mg | Freq: Once | INTRAMUSCULAR | Status: AC
  Administered 2016-05-02: 4 mg via INTRAVENOUS
  Filled 2016-05-02: qty 2

## 2016-05-02 MED ORDER — SODIUM CHLORIDE 0.9 % IV BOLUS (SEPSIS)
1000.0000 mL | Freq: Once | INTRAVENOUS | Status: AC
  Administered 2016-05-02: 1000 mL via INTRAVENOUS

## 2016-05-02 MED ORDER — PANTOPRAZOLE SODIUM 40 MG IV SOLR
40.0000 mg | Freq: Once | INTRAVENOUS | Status: AC
  Administered 2016-05-02: 40 mg via INTRAVENOUS
  Filled 2016-05-02: qty 40

## 2016-05-02 NOTE — ED Notes (Signed)
ED Provider at bedside. 

## 2016-05-02 NOTE — ED Provider Notes (Signed)
The patient is a 45 year old female, takes ibuprofen a couple times a week for some intermittent pains mostly associated with menstrual cycle, states that she has had some epigastric pain and has now started to have some vomiting of dark black looking material as well as multiple black-looking stools. On exam she doesn't fact have some significant epigastric tenderness but no other abdominal tenderness and no peritoneal signs, she is tachycardic to 125, she has clear lung sounds and does not appear to be in distress other than being tearful. At this time the patient will need further evaluation for possible perforated stomach ulcer, she likely has a GI bleed from that, she is Hemoccult positive, labs and x-ray pending, anticipate admission. Hemoglobin over 9  Medical screening examination/treatment/procedure(s) were conducted as a shared visit with non-physician practitioner(s) and myself.  I personally evaluated the patient during the encounter.  Clinical Impression:   Final diagnoses:  Abdominal pain, unspecified abdominal location  Upper GI bleed         Noemi Chapel, MD 05/03/16 2054

## 2016-05-02 NOTE — H&P (Signed)
Brittany Durham is an 45 y.o. female.   Chief Complaint: black stools. HPI:  PRIMARY CARE PROVIDER: Dr. Jannette Fogo  HPI: The patient is a 45 yo woman with severe speech impediment, chronic back pain, who presents with black stool, abdominal pain, and vomiting.  Onset: this morning. Duration: intermittent.  Location: abdominal pain is in epigastric area. Radiation: to back.  Character: 8/10 at times. Dull ache. Alleviated by: Nothing. Exacerbated by: Nothing. Associated Symptoms: Nausea. Vomiting x 3, was also black. Had black stools that came out in clumps and were black; 3 episodes. Of note she started her menses 04/28/16 and has menstrual cramps.  Treatments: none at home except usual medications. Has been taking OTC NSAIDS due to menstrual cramps and back pain; NSAIDs x approximately 1 week, 3 times per day.    Past Medical History:  Diagnosis Date  . Hypertension   . Knee pain    left meniscus tear s/p repair 11/2014  . Seizures ([place])   . Stuttering   . Thyroid nodule   Chronic back pain  Past Surgical History:  Procedure Laterality Date  . KNEE ARTHROSCOPY WITH MEDIAL MENISECTOMY Left 12/09/2014   Procedure: KNEE ARTHROSCOPY WITH PARTIAL MEDIAL AND LATERAL MENISECTOMY;  Surgeon: Sanjuana Kava, MD;  Location: [facility];  Service: Orthopedics;  Laterality: Left;  . TUBAL LIGATION    . WISDOM TOOTH EXTRACTION      Family History  Problem Relation Age of Onset  . Hypertension Mother   . Hypertension Brother    Social History:  reports that she has never smoked. She has never used smokeless tobacco. She reports that she does not drink alcohol or use drugs.  Allergies: No Known Allergies  No current facility-administered medications on file prior to encounter.    Current Outpatient Prescriptions on File Prior to Encounter  Medication Sig Dispense Refill  . amLODipine (NORVASC) 10 MG tablet Take 1 tablet (10 mg total) by mouth daily. 90 tablet 3  . HYDROcodone-acetaminophen  (NORCO/VICODIN) 5-325 MG tablet Take 1-2 tablets by mouth every 6 (six) hours as needed. 10 tablet 0  . lisinopril-hydrochlorothiazide (ZESTORETIC) 20-12.5 MG per tablet Take 2 tablets by mouth daily. 180 tablet 4  . promethazine (PHENERGAN) 25 MG tablet Take 1 tablet (25 mg total) by mouth every 6 (six) hours as needed. 12 tablet 1  . Vitamin D, Ergocalciferol, (DRISDOL) 50000 units CAPS capsule TAKE 1 CAPSULE ONCE A WEEK 12 capsule 3  . ciprofloxacin (CIPRO) 500 MG tablet Take 1 tablet (500 mg total) by mouth 2 (two) times daily. (Patient not taking: Reported on 05/02/2016) 14 tablet 0  . dexamethasone (DECADRON) 4 MG tablet Take 1 tablet (4 mg total) by mouth 2 (two) times daily with a meal. (Patient not taking: Reported on 04/09/2016) 12 tablet 0  . HYDROcodone-acetaminophen (NORCO/VICODIN) 5-325 MG tablet Take 1 tablet by mouth every 6 (six) hours as needed for moderate pain. (Patient not taking: Reported on 05/02/2016) 20 tablet 0  . methocarbamol (ROBAXIN) 500 MG tablet Take 1 tablet (500 mg total) by mouth 3 (three) times daily. (Patient not taking: Reported on 04/09/2016) 21 tablet 0  . metroNIDAZOLE (FLAGYL) 500 MG tablet Take 1 tablet (500 mg total) by mouth 3 (three) times daily. (Patient not taking: Reported on 05/02/2016) 21 tablet 0      (Not in a hospital admission)  Results for orders placed or performed during the hospital encounter of 05/02/16 (from the past 48 hour(s))  Urinalysis, Routine w reflex microscopic  Status: Abnormal   Collection Time: 05/02/16  2:04 PM  Result Value Ref Range   Color, Urine YELLOW YELLOW   APPearance HAZY (A) CLEAR   Specific Gravity, Urine 1.025 1.005 - 1.030   pH 6.0 5.0 - 8.0   Glucose, UA NEGATIVE NEGATIVE mg/dL   Hgb urine dipstick SMALL (A) NEGATIVE   Bilirubin Urine NEGATIVE NEGATIVE   Ketones, ur NEGATIVE NEGATIVE mg/dL   Protein, ur 30 (A) NEGATIVE mg/dL   Nitrite NEGATIVE NEGATIVE   Leukocytes, UA NEGATIVE NEGATIVE   RBC /  HPF 6-30 0 - 5 RBC/hpf   WBC, UA 0-5 0 - 5 WBC/hpf   Bacteria, UA NONE SEEN NONE SEEN   Mucous PRESENT   Lipase, blood     Status: None   Collection Time: 05/02/16  2:54 PM  Result Value Ref Range   Lipase 19 11 - 51 U/L  Comprehensive metabolic panel     Status: Abnormal   Collection Time: 05/02/16  2:54 PM  Result Value Ref Range   Sodium 136 135 - 145 mmol/L   Potassium 3.7 3.5 - 5.1 mmol/L   Chloride 101 101 - 111 mmol/L   CO2 27 22 - 32 mmol/L   Glucose, Bld 101 (H) 65 - 99 mg/dL   BUN 14 6 - 20 mg/dL   Creatinine, Ser 0.65 0.44 - 1.00 mg/dL   Calcium 9.4 8.9 - 10.3 mg/dL   Total Protein 7.6 6.5 - 8.1 g/dL   Albumin 3.9 3.5 - 5.0 g/dL   AST 18 15 - 41 U/L   ALT 25 14 - 54 U/L   Alkaline Phosphatase 61 38 - 126 U/L   Total Bilirubin 0.6 0.3 - 1.2 mg/dL   GFR calc non Af Amer >60 >60 mL/min   GFR calc Af Amer >60 >60 mL/min    Comment: (NOTE) The eGFR has been calculated using the CKD EPI equation. This calculation has not been validated in all clinical situations. eGFR's persistently <60 mL/min signify possible Chronic Kidney Disease.    Anion gap 8 5 - 15  CBC     Status: Abnormal   Collection Time: 05/02/16  2:54 PM  Result Value Ref Range   WBC 7.7 4.0 - 10.5 K/uL   RBC 3.51 (L) 3.87 - 5.11 MIL/uL   Hemoglobin 9.8 (L) 12.0 - 15.0 g/dL   HCT 30.4 (L) 36.0 - 46.0 %   MCV 86.6 78.0 - 100.0 fL   MCH 27.9 26.0 - 34.0 pg   MCHC 32.2 30.0 - 36.0 g/dL   RDW 12.9 11.5 - 15.5 %   Platelets 247 150 - 400 K/uL  Type and screen [facility]     Status: None   Collection Time: 05/02/16  6:51 PM  Result Value Ref Range   ABO/RH(D) A POS    Antibody Screen NEG    Sample Expiration 05/05/2016   Pregnancy, urine     Status: None   Collection Time: 05/02/16  6:55 PM  Result Value Ref Range   Preg Test, Ur NEGATIVE NEGATIVE   Dg Chest Portable 1 View  Result Date: 05/02/2016 CLINICAL DATA:  Diarrhea beginning today.  Left arm pain.  Seizure. EXAM: PORTABLE CHEST  1 VIEW COMPARISON:  Two-view chest x-ray 01/15/2016 FINDINGS: The heart size is normal. The lungs are clear. The visualized soft tissues and bony thorax are unremarkable. IMPRESSION: Negative one-view chest x-ray Electronically Signed   By: San Morelle M.D.   On: 05/02/2016 20:52  ROS ROS: GENERAL: No Fever, chills, or diaphoresis. Positive for fatigue/malaise.  HEENT: No nasal discharge or bleeding. No throat pain or swelling. No eye pain or eye redness.  RESPIRATORY: No cough, wheezing, or shortness of breath.  CARDIOVASCULAR: No chest pain or palpitations.  GI: Per HPI.  NEUROLOGICAL: No headache or focal weakness.  INTEGUMENT: no rashes, itching, or new lesions.  LYMPHATIC SYSTEM: no lymph node swelling or pain.  MUSCULOSKELETAL: Chronic back pains. No new joint pain or joint swelling.  GENITOURINARY: No dysuria or hematuria.  ENDOCRINE: No polyuria or polydipsia.  HEME: No chronic anemia, bleeding, or easy bruising.     Blood pressure 135/99, pulse 106, temperature 98.7 F (37.1 C), temperature source Oral, resp. rate 16, height 5' 7" (1.702 m), weight 97.5 kg (215 lb), last menstrual period 03/24/2016, SpO2 100 %. Physical Exam   Vitals initial: T 98.7,  P 133   BP 126/81  RR 18  O2 97% room air Wt: 97.5 kg Physical Exam: GENERAL: Ill-appearing, well nourished, well-developed.  HEENT: Normocephalic, atraumatic; pupils equal and round. Nares patent, without discharge or bleeding. No oropharyngeal lesions or erythema. Mucous membranes are dry.  NECK: is supple, no masses, trachea midline.  RESPIRATORY: Clear to auscultation bilaterally. Chest wall movements are symmetric. No use of accessory muscles to breathe.  No wheezing, rales, rhonchi. CARDIOVASCULAR: Normal S1, S2. No rubs, or gallops. PMI non-displaced. Carotids: no carotid bruits. Mild tachycardia. DP pulses 2+ bilaterally.  GI: soft, non-distended, normal active bowel sounds. No hepatosplenomegaly. Tender esp  in epigastric area. INTEGUMENT: Clean, dry, and intact. No rashes. MUSCULOSKELETAL: Moving all extremities. No cyanosis. No clubbing. Edema: none bilaterally.  NEUROLOGICAL: Cranial nerves 2-12 grossly intact. Reflexes: 2+ bilaterally. Babinski: toes downgoing bilaterally. Note: speech is difficult to understand (is at baseline). Motor 5/5 throughout. Sensory grossly intact to light touch. No pronator drift.  PSYCHIATRIC: Fully oriented. Normal and appropriate affect.  LYMPHATIC: No cervical lymphadenopathy. No supraclavicular lymphadenopathy.    Assessment/Plan   Upper GI bleed Patient is significantly symptomatic at this time.  Plan:   Start IV pantoprazole q12 hours.  Monitor H/H.  Consult GI. If Hemoglobin is less than 7, then: Transfuse 2 units PRBC's.    Sinus tachycardia Likely due to GI bleed. Plan: Monitor. Give IVF.   Acute blood loss anemia Patient is significantly symptomatic at this time. Hemoglobin level is low but does not require transfusion at this time.  Plan:   Start IV pantoprazole q12 hours.  Monitor H/H.  Consult GI. If Hemoglobin is less than 7, then: Transfuse 2 units PRBC's.    Abdominal pain, epigastric Likely due to GI bleed brought on by NSAIDs. Plan: IV morphine prn. GI consult. Protonix.  HYPERTENSION, BENIGN BP will likely be low due to volume depletion. Plan: Cont bp meds only if patient's bp is elevated.    Tacey Ruiz, MD 05/02/2016, 11:33 PM

## 2016-05-02 NOTE — ED Provider Notes (Signed)
[facility] DEPT Provider Note   CSN: [id number] Arrival date & time: 05/02/16  1352     History   Chief Complaint Chief Complaint  Patient presents with  . Diarrhea    HPI Brittany Durham is a 45 y.o. female.  Pt is a 45 y/o F with PMH of htn, seizures, gerd, and speech disorder who presents to ED for epigastric abdominal pain, nausea, vomiting of "black fluid," and dark black stools, onset today at approx 1 Am, abd pain constant since. Reports she took Ibuprofen yesterday for menstrual cramps. No hx of GIB. No anticoagulant use. No previous hx of anemia. Denies fevers, chills, dizziness, vision or gait changes, Cp, SOB, cough, hemoptysis, dysuria, urinary frequency, or any additional concerns.    The history is provided by the patient and a relative. No language interpreter was used.  Diarrhea   Associated symptoms include abdominal pain and vomiting. Pertinent negatives include no chills, no headaches and no cough.    Past Medical History:  Diagnosis Date  . Hypertension   . Knee pain    left meniscus tear s/p repair 11/2014  . Seizures ([place])   . Stuttering   . Thyroid nodule     Patient Active Problem List   Diagnosis Date Noted  . 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 Thyroid nodule 12/14/2014  . Left knee pain 12/14/2014  . Seizures ([place])   . Altered mental status 12/12/2014  . Syncope 12/12/2014  . Vitamin D deficiency 06/30/2014  . GERD (gastroesophageal reflux disease) 06/29/2014  . SYNCOPE 03/02/2010  . HYPERTENSION, BENIGN 03/01/2010    Past Surgical History:  Procedure Laterality Date  . KNEE ARTHROSCOPY WITH MEDIAL MENISECTOMY Left 12/09/2014   Procedure: KNEE ARTHROSCOPY WITH PARTIAL MEDIAL AND LATERAL MENISECTOMY;  Surgeon: Sanjuana Kava, MD;  Location: [facility];  Service: Orthopedics;  Laterality: Left;  . TUBAL LIGATION    . WISDOM TOOTH EXTRACTION      OB History    Gravida Para Term Preterm AB Living   1 1 1     1    SAB TAB Ectopic Multiple Live  Births                   Home Medications    Prior to Admission medications   Medication Sig Start Date End Date Taking? Authorizing Provider  amLODipine (NORVASC) 10 MG tablet Take 1 tablet (10 mg total) by mouth daily. 08/20/14   Sharion Balloon, FNP  ciprofloxacin (CIPRO) 500 MG tablet Take 1 tablet (500 mg total) by mouth 2 (two) times daily. 04/09/16   Fredia Sorrow, MD  dexamethasone (DECADRON) 4 MG tablet Take 1 tablet (4 mg total) by mouth 2 (two) times daily with a meal. Patient not taking: Reported on 04/09/2016 02/24/16   Lily Kocher, PA-C  HYDROcodone-acetaminophen (NORCO/VICODIN) 5-325 MG tablet Take 1 tablet by mouth every 6 (six) hours as needed for moderate pain. 01/15/16   Milton Ferguson, MD  HYDROcodone-acetaminophen (NORCO/VICODIN) 5-325 MG tablet Take 1-2 tablets by mouth every 6 (six) hours as needed. 04/09/16   Fredia Sorrow, MD  lisinopril-hydrochlorothiazide (ZESTORETIC) 20-12.5 MG per tablet Take 2 tablets by mouth daily. 07/19/14   Sharion Balloon, FNP  methocarbamol (ROBAXIN) 500 MG tablet Take 1 tablet (500 mg total) by mouth 3 (three) times daily. Patient not taking: Reported on 04/09/2016 02/24/16   Lily Kocher, PA-C  metroNIDAZOLE (FLAGYL) 500 MG tablet Take 1 tablet (500 mg total) by mouth 3 (three) times daily. 04/09/16   Fredia Sorrow,  MD  promethazine (PHENERGAN) 25 MG tablet Take 1 tablet (25 mg total) by mouth every 6 (six) hours as needed. 04/09/16   Fredia Sorrow, MD  Vitamin D, Ergocalciferol, (DRISDOL) 50000 units CAPS capsule TAKE 1 CAPSULE ONCE A WEEK 11/29/15   Sharion Balloon, FNP    Family History Family History  Problem Relation Age of Onset  . Hypertension Mother   . Hypertension Brother     Social History Social History  Substance Use Topics  . Smoking status: Never Smoker  . Smokeless tobacco: Never Used  . Alcohol use No     Allergies   Patient has no known allergies.   Review of Systems Review of Systems    Constitutional: Negative for chills, fever and unexpected weight change.  Eyes: Negative for visual disturbance.  Respiratory: Negative for cough and shortness of breath.   Cardiovascular: Negative for chest pain and palpitations.  Gastrointestinal: Positive for abdominal pain, diarrhea, nausea and vomiting.  Genitourinary: Negative for dysuria, frequency and urgency.  Musculoskeletal: Negative for back pain and neck pain.  Skin: Negative for rash.  Neurological: Positive for weakness (generalized). Negative for dizziness, numbness and headaches.  All other systems reviewed and are negative.    Physical Exam Updated Vital Signs BP 126/81 (BP Location: Left Arm)   Pulse (!) 133   Temp 98.7 F (37.1 C) (Oral)   Resp 18   Ht 5\' 7"  (1.702 m)   Wt 97.5 kg   LMP 03/24/2016 Comment: NEG U PREG 04/09/16  SpO2 97%   BMI 33.67 kg/m   Physical Exam  Constitutional: She is oriented to person, place, and time. She appears well-developed and well-nourished.  HENT:  Head: Normocephalic and atraumatic.  Nose: Nose normal.  Mouth/Throat: Oropharynx is clear and moist.  Eyes: Conjunctivae and EOM are normal. Pupils are equal, round, and reactive to light.  Neck: Normal range of motion. Neck supple.  Cardiovascular: Normal heart sounds and intact distal pulses.  Tachycardia present.  Exam reveals no gallop and no friction rub.   No murmur heard. Pulmonary/Chest: Effort normal and breath sounds normal.  Abdominal: Soft. There is tenderness (epigastric). There is no rebound and no guarding.  Genitourinary: Rectal exam shows guaiac positive stool.  Musculoskeletal: Normal range of motion.  Neurological: She is alert and oriented to person, place, and time.  Skin: Skin is warm and dry.  Nursing note and vitals reviewed.    ED Treatments / Results  Labs (all labs ordered are listed, but only abnormal results are displayed) Labs Reviewed  COMPREHENSIVE METABOLIC PANEL - Abnormal; Notable  for the following:       Result Value   Glucose, Bld 101 (*)    All other components within normal limits  CBC - Abnormal; Notable for the following:    RBC 3.51 (*)    Hemoglobin 9.8 (*)    HCT 30.4 (*)    All other components within normal limits  LIPASE, BLOOD  URINALYSIS, ROUTINE W REFLEX MICROSCOPIC  TYPE AND SCREEN    EKG  EKG Interpretation None       Radiology No results found.  Procedures Procedures (including critical care time)  Medications Ordered in ED Medications  sodium chloride 0.9 % bolus 1,000 mL (not administered)  ondansetron (ZOFRAN) injection 4 mg (not administered)     Initial Impression / Assessment and Plan / ED Course  I have reviewed the triage vital signs and the nursing notes.  Pertinent labs & imaging results that were  available during my care of the patient were reviewed by me and considered in my medical decision making (see chart for details).  Clinical Course    Pt is a 45 y/o F who presents to ED for epigastric abd pain with n/v/d, hemoccult positive, concern for upper GIB. Will get labs, type and screen, and CXR to r/o perforated ulcer. Plan to admit, pt agrees with plan, denies any additional concerns.   Final Clinical Impressions(s) / ED Diagnoses   Final diagnoses:  None    New Prescriptions New Prescriptions   No medications on file     Ulice Bold, NP 05/02/16 2337    Noemi Chapel, MD 05/03/16 2054

## 2016-05-02 NOTE — ED Triage Notes (Signed)
Pt reports n/diarrhea that started early this am. Pt transported by EMS. Pt also c/o L arm pain.

## 2016-05-03 ENCOUNTER — Encounter (HOSPITAL_COMMUNITY): Payer: Self-pay

## 2016-05-03 ENCOUNTER — Encounter (HOSPITAL_COMMUNITY)
Admission: EM | Disposition: A | Discharge status: Home / Self Care | Payer: Self-pay | Source: Home / Self Care | Attending: Internal Medicine

## 2016-05-03 ENCOUNTER — Ambulatory Visit: Payer: Medicaid Other | Admitting: Neurology

## 2016-05-03 ENCOUNTER — Telehealth: Payer: Self-pay | Admitting: *Deleted

## 2016-05-03 ENCOUNTER — Inpatient Hospital Stay (HOSPITAL_COMMUNITY): Payer: Medicaid Other

## 2016-05-03 DIAGNOSIS — D62 Acute posthemorrhagic anemia: Secondary | ICD-10-CM

## 2016-05-03 DIAGNOSIS — K922 Gastrointestinal hemorrhage, unspecified: Secondary | ICD-10-CM

## 2016-05-03 DIAGNOSIS — K3189 Other diseases of stomach and duodenum: Secondary | ICD-10-CM

## 2016-05-03 DIAGNOSIS — K259 Gastric ulcer, unspecified as acute or chronic, without hemorrhage or perforation: Secondary | ICD-10-CM

## 2016-05-03 HISTORY — PX: ESOPHAGOGASTRODUODENOSCOPY: SHX5428

## 2016-05-03 LAB — BASIC METABOLIC PANEL
Anion gap: 6 (ref 5–15)
BUN: 11 mg/dL (ref 6–20)
CO2: 27 mmol/L (ref 22–32)
Calcium: 8.8 mg/dL — ABNORMAL LOW (ref 8.9–10.3)
Chloride: 104 mmol/L (ref 101–111)
Creatinine, Ser: 0.67 mg/dL (ref 0.44–1.00)
GFR calc Af Amer: >60 mL/min (ref >60)
GFR calc non Af Amer: >60 mL/min (ref >60)
Glucose, Bld: 125 mg/dL — ABNORMAL HIGH (ref 65–99)
Potassium: 3.6 mmol/L (ref 3.5–5.1)
Sodium: 137 mmol/L (ref 135–145)

## 2016-05-03 LAB — CBC
HCT: 27.9 % — ABNORMAL LOW (ref 36.0–46.0)
Hemoglobin: 8.7 g/dL — ABNORMAL LOW (ref 12.0–15.0)
MCH: 27.4 pg (ref 26.0–34.0)
MCHC: 31.2 g/dL (ref 30.0–36.0)
MCV: 87.7 fL (ref 78.0–100.0)
Platelets: 212 10*3/uL (ref 150–400)
RBC: 3.18 MIL/uL — ABNORMAL LOW (ref 3.87–5.11)
RDW: 13.1 % (ref 11.5–15.5)
WBC: 4.9 10*3/uL (ref 4.0–10.5)

## 2016-05-03 LAB — HEMOGLOBIN AND HEMATOCRIT, BLOOD
HCT: 28.3 % — ABNORMAL LOW (ref 36.0–46.0)
HCT: 27.6 % — ABNORMAL LOW (ref 36.0–46.0)
Hemoglobin: 9.4 g/dL — ABNORMAL LOW (ref 12.0–15.0)
Hemoglobin: 9 g/dL — ABNORMAL LOW (ref 12.0–15.0)

## 2016-05-03 LAB — PROTIME-INR
INR: 1.06
Prothrombin Time: 13.8 seconds (ref 11.4–15.2)

## 2016-05-03 LAB — APTT: aPTT: 31 seconds (ref 24–36)

## 2016-05-03 SURGERY — EGD (ESOPHAGOGASTRODUODENOSCOPY)
Anesthesia: Moderate Sedation

## 2016-05-03 MED ORDER — ONDANSETRON HCL 4 MG/2ML IJ SOLN
INTRAMUSCULAR | Status: DC | PRN
  Administered 2016-05-03: 4 mg via INTRAVENOUS

## 2016-05-03 MED ORDER — LIDOCAINE VISCOUS 2 % MT SOLN
OROMUCOSAL | Status: AC
  Administered 2016-05-03: 16:00:00
  Filled 2016-05-03: qty 15

## 2016-05-03 MED ORDER — SODIUM CHLORIDE 0.9 % IV SOLN
INTRAVENOUS | Status: DC
  Administered 2016-05-03: 13:00:00 via INTRAVENOUS

## 2016-05-03 MED ORDER — MEPERIDINE HCL 100 MG/ML IJ SOLN
INTRAMUSCULAR | Status: DC | PRN
  Administered 2016-05-03: 25 mg via INTRAVENOUS
  Administered 2016-05-03 (×2): 50 mg via INTRAVENOUS

## 2016-05-03 MED ORDER — PANTOPRAZOLE SODIUM 40 MG IV SOLR
40.0000 mg | Freq: Two times a day (BID) | INTRAVENOUS | Status: DC
  Administered 2016-05-03 – 2016-05-04 (×4): 40 mg via INTRAVENOUS
  Filled 2016-05-03 (×4): qty 40

## 2016-05-03 MED ORDER — LISINOPRIL 10 MG PO TABS
20.0000 mg | ORAL_TABLET | Freq: Every day | ORAL | Status: DC
  Administered 2016-05-03: 20 mg via ORAL
  Filled 2016-05-03: qty 2

## 2016-05-03 MED ORDER — MEPERIDINE HCL 100 MG/ML IJ SOLN
INTRAMUSCULAR | Status: AC
  Administered 2016-05-03: 16:00:00
  Filled 2016-05-03: qty 2

## 2016-05-03 MED ORDER — PROMETHAZINE HCL 25 MG/ML IJ SOLN: 12.5000 mg | Freq: Four times a day (QID) | INTRAMUSCULAR | Status: DC | PRN

## 2016-05-03 MED ORDER — ONDANSETRON HCL 4 MG/2ML IJ SOLN
4.0000 mg | Freq: Four times a day (QID) | INTRAMUSCULAR | Status: DC | PRN
  Administered 2016-05-03: 4 mg via INTRAVENOUS
  Filled 2016-05-03: qty 2

## 2016-05-03 MED ORDER — DEXTROSE-NACL 5-0.45 % IV SOLN
INTRAVENOUS | Status: DC
  Administered 2016-05-03 (×3): via INTRAVENOUS

## 2016-05-03 MED ORDER — MIDAZOLAM HCL 5 MG/5ML IJ SOLN
INTRAMUSCULAR | Status: DC | PRN
Start: 2016-05-03 — End: 2016-05-03
  Administered 2016-05-03 (×3): 2 mg via INTRAVENOUS

## 2016-05-03 MED ORDER — HYDROCODONE-ACETAMINOPHEN 5-325 MG PO TABS
1.0000 | ORAL_TABLET | ORAL | Status: DC | PRN
  Administered 2016-05-03: 1 via ORAL
  Filled 2016-05-03: qty 1

## 2016-05-03 MED ORDER — HYDRALAZINE HCL 20 MG/ML IJ SOLN: 5.0000 mg | Freq: Four times a day (QID) | INTRAMUSCULAR | Status: DC | PRN

## 2016-05-03 MED ORDER — LORAZEPAM 2 MG/ML IJ SOLN
1.0000 mg | Freq: Once | INTRAMUSCULAR | Status: AC | PRN
  Administered 2016-05-03: 1 mg via INTRAVENOUS
  Filled 2016-05-03: qty 1

## 2016-05-03 MED ORDER — AMLODIPINE BESYLATE 5 MG PO TABS
10.0000 mg | ORAL_TABLET | Freq: Every day | ORAL | Status: DC
Start: 2016-05-03 — End: 2016-05-03
  Administered 2016-05-03: 10 mg via ORAL
  Filled 2016-05-03: qty 2

## 2016-05-03 MED ORDER — LIDOCAINE VISCOUS 2 % MT SOLN
OROMUCOSAL | Status: DC | PRN
  Administered 2016-05-03: 4 mL via OROMUCOSAL

## 2016-05-03 MED ORDER — MIDAZOLAM HCL 5 MG/5ML IJ SOLN
INTRAMUSCULAR | Status: AC
  Administered 2016-05-03: 16:00:00
  Filled 2016-05-03: qty 10

## 2016-05-03 MED ORDER — ONDANSETRON HCL 4 MG/2ML IJ SOLN
INTRAMUSCULAR | Status: AC
  Administered 2016-05-03: 16:00:00
  Filled 2016-05-03: qty 2

## 2016-05-03 MED ORDER — STERILE WATER FOR IRRIGATION IR SOLN
Status: DC | PRN
  Administered 2016-05-03: 100 mL

## 2016-05-03 NOTE — Progress Notes (Signed)
Pt transported down to OR for EGD procedure.

## 2016-05-03 NOTE — Progress Notes (Signed)
PROGRESS NOTE    Brittany Durham  [id number] DOB: 1970/07/23 DOA: 05/02/2016 PCP: Evelina Dun, FNP   Brief Narrative: The patient is a 45 yo woman with severe speech impediment, chronic back pain, who presents with black stool, abdominal pain, and vomiting   Assessment & Plan:   Principal Problem:   Upper GI bleed Active Problems:   HYPERTENSION, BENIGN   GI bleed   Sinus tachycardia   Acute blood loss anemia   Abdominal pain, epigastric  1-Upper GI bleed, acute blood loss anemia;  Presents with melena, abdominal pain.  Continue with IV protonix.  Plan for endoscopy today  GI following.  IV fluids.  Continue to cycle hb. Might need blood transfusion if hb continue to decreased.   2-Tachycardia;  Continue with IV fluids.   3-HTN hold Norvasc and lisinopril in setting of GI bleed.  Will order PRN hydralazine   4-Speech problem, chronic. For one year. She has not had MRI brain. Will ordered it.      DVT prophylaxis: scd, no anticoagulation in event of GI bleed.  Code Status: Full code.  Family Communication: care discussed with patient.  Disposition Plan: remain inpatient.   Consultants:   GI   Procedures:  none   Antimicrobials: none    Subjective: Report abdominal pain, mild epigastric area.  Speech problem for one year, has not had Work up /    Objective: Vitals:   05/03/16 0000 05/03/16 0030 05/03/16 0200 05/03/16 0524  BP: 129/82 129/74 (!) 144/84 (!) 143/92  Pulse: 107 102 (!) 110 (!) 111  Resp: 13 14  18   Temp:   98.5 F (36.9 C) 98.2 F (36.8 C)  TempSrc:   Oral Oral  SpO2: 99% 92% 94% 100%  Weight:   97.5 kg (214 lb 15.2 oz)   Height:   5\' 7"  (1.702 m)    No intake or output data in the 24 hours ending 05/03/16 1055 Filed Weights   05/02/16 1401 05/03/16 0200  Weight: 97.5 kg (215 lb) 97.5 kg (214 lb 15.2 oz)    Examination:  General exam: Appears calm and comfortable  Respiratory system: Clear to auscultation.  Respiratory effort normal. Cardiovascular system: S1 & S2 heard, RRR. No JVD, murmurs, rubs, gallops or clicks. No pedal edema. Gastrointestinal system: Abdomen is nondistended, soft and nontender. No organomegaly or masses felt. Normal bowel sounds heard. Central nervous system: Alert and oriented. No focal neurological deficits. Speech impediment.  Extremities: Symmetric 5 x 5 power. Skin: No rashes, lesions or ulcers Psychiatry: Judgement and insight appear normal. Mood & affect appropriate.     Data Reviewed: I have personally reviewed following labs and imaging studies  CBC:  Recent Labs Lab 05/02/16 1454 05/03/16 0556  WBC 7.7 4.9  HGB 9.8* 8.7*  HCT 30.4* 27.9*  MCV 86.6 87.7  PLT 247 [id number]   Basic Metabolic Panel:  Recent Labs Lab 05/02/16 1454 05/03/16 0556  NA 136 137  K 3.7 3.6  CL 101 104  CO2 27 27  GLUCOSE 101* 125*  BUN 14 11  CREATININE 0.65 0.67  CALCIUM 9.4 8.8*   GFR: Estimated Creatinine Clearance: 104.3 mL/min (by C-G formula based on SCr of 0.67 mg/dL). Liver Function Tests:  Recent Labs Lab 05/02/16 1454  AST 18  ALT 25  ALKPHOS 61  BILITOT 0.6  PROT 7.6  ALBUMIN 3.9    Recent Labs Lab 05/02/16 1454  LIPASE 19   No results for input(s): AMMONIA in the last 168 hours.  Coagulation Profile:  Recent Labs Lab 05/03/16 0556  INR 1.06   Cardiac Enzymes: No results for input(s): CKTOTAL, CKMB, CKMBINDEX, TROPONINI in the last 168 hours. BNP (last 3 results) No results for input(s): PROBNP in the last 8760 hours. HbA1C: No results for input(s): HGBA1C in the last 72 hours. CBG: No results for input(s): GLUCAP in the last 168 hours. Lipid Profile: No results for input(s): CHOL, HDL, LDLCALC, TRIG, CHOLHDL, LDLDIRECT in the last 72 hours. Thyroid Function Tests: No results for input(s): TSH, T4TOTAL, FREET4, T3FREE, THYROIDAB in the last 72 hours. Anemia Panel: No results for input(s): VITAMINB12, FOLATE, FERRITIN, TIBC, IRON,  RETICCTPCT in the last 72 hours. Sepsis Labs: No results for input(s): PROCALCITON, LATICACIDVEN in the last 168 hours.  No results found for this or any previous visit (from the past 240 hour(s)).       Radiology Studies: Dg Chest Portable 1 View  Result Date: 05/02/2016 CLINICAL DATA:  Diarrhea beginning today.  Left arm pain.  Seizure. EXAM: PORTABLE CHEST 1 VIEW COMPARISON:  Two-view chest x-ray 01/15/2016 FINDINGS: The heart size is normal. The lungs are clear. The visualized soft tissues and bony thorax are unremarkable. IMPRESSION: Negative one-view chest x-ray Electronically Signed   By: San Morelle M.D.   On: 05/02/2016 20:52        Scheduled Meds: . amLODipine  10 mg Oral Daily  . lisinopril  20 mg Oral Daily  . pantoprazole (PROTONIX) IV  40 mg Intravenous Q12H   Continuous Infusions: . dextrose 5 % and 0.45% NaCl 100 mL/hr at 05/03/16 0230     LOS: 1 day    Time spent: 35 minutes     Elmarie Shiley, MD [facility] Pager [phone]  If 7PM-7AM, please contact night-coverage [email or web address] Password [facility] 05/03/2016, 10:55 AM

## 2016-05-03 NOTE — Telephone Encounter (Signed)
No showed follow up appointment. 

## 2016-05-03 NOTE — Assessment & Plan Note (Signed)
Patient is significantly symptomatic at this time. Hemoglobin level is low but does not require transfusion at this time.  Plan:   Start IV pantoprazole q12 hours.  Monitor H/H.  Consult GI. If Hemoglobin is less than 7, then: Transfuse 2 units PRBC's.

## 2016-05-03 NOTE — Assessment & Plan Note (Signed)
BP will likely be low due to volume depletion. Plan: Cont bp meds only if patient's bp is elevated.

## 2016-05-03 NOTE — Assessment & Plan Note (Signed)
Patient is significantly symptomatic at this time.  Plan:   Start IV pantoprazole q12 hours.  Monitor H/H.  Consult GI. If Hemoglobin is less than 7, then: Transfuse 2 units PRBC's.

## 2016-05-03 NOTE — Assessment & Plan Note (Signed)
Likely due to GI bleed. Plan: Monitor. Give IVF.

## 2016-05-03 NOTE — Progress Notes (Signed)
Pt being transported down to MRI.

## 2016-05-03 NOTE — Care Management Note (Signed)
Case Management Note  Patient Details  Name: Brittany Durham MRN: [id number] Date of Birth: Jul 09, 1970  Subjective/Objective:   Patient adm from home with GI bleed. She lives alone. No DME/HH PTA. She has PCP, transportation, no issues affording medications.              Action/Plan: Anticipate DC home with self care.  Expected Discharge Date:   05/05/2016               Expected Discharge Plan:  Home/Self Care  In-House Referral:  NA  Discharge planning Services  CM Consult  Post Acute Care Choice:  NA Choice offered to:  NA  DME Arranged:    DME Agency:     HH Arranged:    HH Agency:     Status of Service:  Completed, signed off  If discussed at [employer] of Stay Meetings, dates discussed:    Additional Comments:  Ceasar Decandia, Chauncey Reading, RN 05/03/2016, 2:56 PM

## 2016-05-03 NOTE — Assessment & Plan Note (Signed)
Likely due to GI bleed brought on by NSAIDs. Plan: IV morphine prn. GI consult. Protonix.

## 2016-05-03 NOTE — Op Note (Signed)
[facility] Patient Name: Brittany Durham Procedure Date: 05/03/2016 4:16 PM MRN: [id number] Date of Birth: [birth year] Attending MD: Gennette Pac , MD CSN: [id number] Age: 45 Admit Type: Inpatient Procedure:                Upper GI endoscopy - diagnostic Indications:              Melena Providers:                Gennette Pac, MD, Edrick Kins, RN, Burke Keels, Technician Referring MD:              Medicines:                Midazolam 6 mg IV, Meperidine 125 mg IV,                            Ondansetron 4 mg IV Complications:            No immediate complications. Estimated Blood Loss:     Estimated blood loss: none. Procedure:                Pre-Anesthesia Assessment:                           - Prior to the procedure, a History and Physical                            was performed, and patient medications and                            allergies were reviewed. The patient's tolerance of                            previous anesthesia was also reviewed. The risks                            and benefits of the procedure and the sedation                            options and risks were discussed with the patient.                            All questions were answered, and informed consent                            was obtained. Prior Anticoagulants: The patient has                            taken no previous anticoagulant or antiplatelet                            agents. After reviewing the risks and benefits, the  patient was deemed in satisfactory condition to                            undergo the procedure.                           After obtaining informed consent, the endoscope was                            passed under direct vision. Throughout the                            procedure, the patient's blood pressure, pulse, and                            oxygen saturations were monitored continuously.  The                            [id number] ([id number]) was introduced through the                            mouth, and advanced to the second part of duodenum.                            The upper GI endoscopy was accomplished without                            difficulty. The patient tolerated the procedure                            well. Scope In: 4:36:02 PM Scope Out: 4:41:43 PM Total Procedure Duration: 0 hours 5 minutes 41 seconds  Findings:      The examined esophagus was normal.      A medium-sized hiatal hernia was present.      One non-bleeding cratered gastric ulcer was found in the gastric antrum.       The lesion was 4 mm in largest dimension.      A few localized 7 mm erosions were found in the gastric antrum.      The duodenal bulb and second portion of the duodenum were normal.       Estimated blood loss: none. Impression:               - Normal esophagus.                           - Medium-sized hiatal hernia.                           - Non-bleeding gastric ulcer.                           - Erosive gastropathy.                           - Normal duodenal bulb and second portion of the  duodenum.                           - No specimens collected. Moderate Sedation:      Moderate (conscious) sedation was administered by the endoscopy nurse       and supervised by the endoscopist. The following parameters were       monitored: oxygen saturation, heart rate, blood pressure, respiratory       rate, EKG, adequacy of pulmonary ventilation, and response to care.       Total physician intraservice time was 17 minutes. Recommendation:           - Return patient to hospital ward for ongoing care.                           - Cardiac diet.                           - Continue present medications.                           - Repeat upper endoscopy in 3 months for                            surveillance.                           - Return to GI clinic in  12 weeks. Twice a day PPI                            x 12 weeks; No nonsteroidals; check H. pylori                            serologies. Surveillance EGD and a first ever                            colonoscopy in 12 weeks. Likely discharge tomorrow. Procedure Code(s):        --- Professional ---                           [phone], Esophagogastroduodenoscopy, flexible,                            transoral; diagnostic, including collection of                            specimen(s) by brushing or washing, when performed                            (separate procedure)                           99152, Moderate sedation services provided by the                            same physician or other qualified health care  professional performing the diagnostic or                            therapeutic service that the sedation supports,                            requiring the presence of an independent trained                            observer to assist in the monitoring of the                            patient's level of consciousness and physiological                            status; initial 15 minutes of intraservice time,                            patient age 34 years or older Diagnosis Code(s):        --- Professional ---                           K44.9, Diaphragmatic hernia without obstruction or                            gangrene                           K25.9, Gastric ulcer, unspecified as acute or                            chronic, without hemorrhage or perforation                           K31.89, Other diseases of stomach and duodenum                           K92.1, Melena (includes Hematochezia) CPT copyright 2016 American Medical Association. All rights reserved. The codes documented in this report are preliminary and upon coder review may  be revised to meet current compliance requirements. Gerrit Friends. Brandalynn Ofallon, MD Gennette Pac, MD 05/03/2016  4:54:46 PM This report has been signed electronically. Number of Addenda: 0

## 2016-05-03 NOTE — Consult Note (Signed)
Referring Provider: Elmarie Shiley, MD Primary Care Physician:  Evelina Dun, FNP Primary Gastroenterologist:  Garfield Cornea, MD  Reason for Consultation:  Melena, coffee ground emesis  HPI: Brittany Durham is a 45 y.o. 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 She has had four black stools. Vomited several times. Takes ibuprofen prn, but not daily. Has taken some with recent menstrual cycle. Denies chronic GERD, constipation, diarrhea, rectal bleeding, dysphagia, unintentional weight loss. No prior EGD/TCS.  In ED, her Hgb was 9.8, had been 11.2 few weeks ago and 11.6 in 01/2016. This morning her Hgb is 8.7. No further hematemesis or melena since admission but her stool was heme + on DRE. She denies lightheadedness, dizziness. No DOE. Notably, she did not have menstrual cycle in October and November but just completed normal cycle. H/o uterine fibroids.   She was in ED in 03/2016 with abdominal pain. CT showed, mild inflammatory process in LLQ ?acute epiploic appendagitis vs mild acute diverticulitis given ?bowel wall thickening in same area. Treated with cipro/flagyl.    Prior to Admission medications   Medication Sig Start Date End Date Taking? Authorizing Provider  amLODipine (NORVASC) 10 MG tablet Take 1 tablet (10 mg total) by mouth daily. 08/20/14  Yes Sharion Balloon, FNP       Yes Fredia Sorrow, MD  lisinopril-hydrochlorothiazide (ZESTORETIC) 20-12.5 MG per tablet Take 2 tablets by mouth daily. 07/19/14  Yes Sharion Balloon, FNP       Yes Fredia Sorrow, MD  Vitamin D, Ergocalciferol, (DRISDOL) 50000 units CAPS capsule TAKE 1 CAPSULE ONCE A WEEK 11/29/15  Yes Sharion Balloon, FNP        Fredia Sorrow, MD        Lily Kocher, PA-C        Milton Ferguson, MD        Lily Kocher, PA-C        Fredia Sorrow, MD     Current Facility-Administered Medications  Medication Dose Route Frequency Provider Last Rate Last Dose  . amLODipine (NORVASC) tablet 10 mg  10 mg Oral Daily Tacey Ruiz, MD      . dextrose 5 %-0.45 % sodium chloride infusion   Intravenous Continuous Tacey Ruiz, MD 100 mL/hr at 05/03/16 0230    . HYDROcodone-acetaminophen (NORCO/VICODIN) 5-325 MG per tablet 1-2 tablet  1-2 tablet Oral Q4H PRN Tacey Ruiz, MD      . lisinopril (PRINIVIL,ZESTRIL) tablet 20 mg  20 mg Oral Daily Tacey Ruiz, MD      . ondansetron [facility]) injection 4 mg  4 mg Intravenous Q6H PRN Tacey Ruiz, MD      . pantoprazole (PROTONIX) injection 40 mg  40 mg Intravenous Q12H Tacey Ruiz, MD   40 mg at 05/03/16 0401  . promethazine (PHENERGAN) injection 12.5 mg  12.5 mg Intravenous Q6H PRN Tacey Ruiz, MD        Allergies as of 05/02/2016  . (No Known Allergies)    Past Medical History:  Diagnosis Date  . Hypertension   . Knee pain    left meniscus tear s/p repair 11/2014  . Seizures ([place])   . Stuttering    patient reports this started after her knee surgery in 11/2014.   Marland Kitchen Thyroid nodule     Past Surgical History:  Procedure Laterality Date  . KNEE ARTHROSCOPY WITH MEDIAL MENISECTOMY Left 12/09/2014  Procedure: KNEE ARTHROSCOPY WITH PARTIAL MEDIAL AND LATERAL MENISECTOMY;  Surgeon: Sanjuana Kava, MD;  Location: [facility];  Service: Orthopedics;  Laterality: Left;  . TUBAL LIGATION    . WISDOM TOOTH EXTRACTION      Family History  Problem Relation Age of Onset  . Hypertension Mother   . Hypertension Brother   . Colon cancer Neg Hx     Social History   Social History  . Marital status: Single    Spouse name: N/A  . Number of children: 1  . Years of education: 70   Occupational History  . Umemployed    Social History Main Topics  . Smoking status: Never Smoker  . Smokeless tobacco: Never Used  . Alcohol use No  . Drug use: No  . Sexual activity: Yes    Birth control/  protection: Surgical   Other Topics Concern  . Not on file   Social History Narrative   Lives at home alone.   Right-handed.   Occasional caffeine use.     ROS:  General: Negative for anorexia, weight loss, fever, chills, fatigue, weakness. Eyes: Negative for vision changes.  ENT: Negative for hoarseness, difficulty swallowing , nasal congestion. CV: Negative for chest pain, angina, palpitations, dyspnea on exertion, peripheral edema.  Respiratory: Negative for dyspnea at rest, dyspnea on exertion, cough, sputum, wheezing.  GI: See history of present illness. GU:  Negative for dysuria, hematuria, urinary incontinence, urinary frequency, nocturnal urination.  MS: Negative for joint pain, +low back pain.  Derm: Negative for rash or itching.  Neuro: Negative for weakness, abnormal sensation, seizure, frequent headaches, memory loss, confusion.  Psych: Negative for anxiety, depression, suicidal ideation, hallucinations.  Endo: Negative for unusual weight change.  Heme: Negative for bruising or bleeding. Allergy: Negative for rash or hives.       Physical Examination: Vital signs in last 24 hours: Temp:  [98.2 F (36.8 C)-98.7 F (37.1 C)] 98.2 F (36.8 C) (12/21 0524) Pulse Rate:  [96-133] 111 (12/21 0524) Resp:  [11-29] 18 (12/21 0524) BP: (123-152)/(74-100) 143/92 (12/21 0524) SpO2:  [92 %-100 %] 100 % (12/21 0524) Weight:  [214 lb 15.2 oz (97.5 kg)-215 lb (97.5 kg)] 214 lb 15.2 oz (97.5 kg) (12/21 0200) Last BM Date: 05/03/16  General: Well-nourished, well-developed in no acute distress.  Head: Normocephalic, atraumatic.   Eyes: Conjunctiva pink, no icterus. Mouth: Oropharyngeal mucosa moist and pink , no lesions erythema or exudate. Neck: Supple without thyromegaly, masses, or lymphadenopathy.  Lungs: Clear to auscultation bilaterally.  Heart: Regular rate and rhythm, no murmurs rubs or gallops.  Abdomen: Bowel sounds are normal,moderate epigastric tenderness,  nondistended, no hepatosplenomegaly or masses, no abdominal bruits or    hernia , no rebound or guarding.   Rectal: not performed Extremities: No lower extremity edema, clubbing, deformity.  Neuro: Alert and oriented x 4 , grossly normal neurologically.  Skin: Warm and dry, no rash or jaundice.   Psych: Alert and cooperative, normal mood and affect.        Intake/Output from previous day: No intake/output data recorded. Intake/Output this shift: No intake/output data recorded.  Lab Results: CBC  Recent Labs  05/02/16 1454 05/03/16 0556  WBC 7.7 4.9  HGB 9.8* 8.7*  HCT 30.4* 27.9*  MCV 86.6 87.7  PLT 247 212   BMET  Recent Labs  05/02/16 1454 05/03/16 0556  NA 136 137  K 3.7 3.6  CL 101 104  CO2 27 27  GLUCOSE 101* 125*  BUN 14 11  CREATININE 0.65 0.67  CALCIUM 9.4 8.8*   LFT  Recent Labs  05/02/16 1454  BILITOT 0.6  ALKPHOS 61  AST 18  ALT 25  PROT 7.6  ALBUMIN 3.9    Lipase  Recent Labs  05/02/16 1454  LIPASE 19    PT/INR  Recent Labs  05/03/16 0556  LABPROT 13.8  INR 1.06      Imaging Studies: Ct Abdomen Pelvis W Contrast  Result Date: 04/09/2016 CLINICAL DATA:  Left lower quadrant pain for 2 days EXAM: CT ABDOMEN AND PELVIS WITH CONTRAST TECHNIQUE: Multidetector CT imaging of the abdomen and pelvis was performed using the standard protocol following bolus administration of intravenous contrast. CONTRAST:  75mL ISOVUE-300 IOPAMIDOL (ISOVUE-300) INJECTION 61%, 163mL ISOVUE-300 IOPAMIDOL (ISOVUE-300) INJECTION 61% COMPARISON:  03/16/2011 FINDINGS: Lower chest: Lung bases demonstrate patchy dependent atelectasis. No consolidation or pleural effusion. No significant pericardial effusion. Hepatobiliary: No focal liver abnormality is seen. No gallstones, gallbladder wall thickening, or biliary dilatation. Pancreas: Unremarkable. No pancreatic ductal dilatation or surrounding inflammatory changes. Spleen: Normal in size without focal abnormality.  Adrenals/Urinary Tract: Adrenal glands are within normal limits. Multiple punctate nonobstructing stones within the bilateral kidneys. Bladder is unremarkable. Stomach/Bowel: There is no dilated large or small bowel. Numerous colon diverticula are present. Questionable wall thickening versus underdistention of the sigmoid colon. There is mild edema and inflammatory change in the left lower quadrant mesentery. On the coronal views, inflammatory change surrounds a fat containing structure with central increased density. Appendix is visualized and is normal. Vascular/Lymphatic: Non aneurysmal aorta. No abnormally enlarged abdominal or pelvic lymph nodes. Reproductive: The uterus is enlarged and contains multiple masses, some with calcifications, consistent with fibroids. Dominant calcified mass measures approximately 6 by 5.5 cm. No adnexal mass is seen. Other: No free air or free fluid. Musculoskeletal: No acute or significant osseous findings. IMPRESSION: 1. Mild inflammatory process within the left lower quadrant of the abdomen, imaging features are suggestive of an acute epiploic appendagitis. Mild acute diverticulitis is also included in the differential given numerous diverticula in the region and questionable bowel wall thickening versus underdistended bowel. There is no perforation or abscess. 2. Enlarged uterus containing multiple fibroids, some of which are calcified. Electronically Signed   By: Donavan Foil M.D.   On: 04/09/2016 22:13   Dg Chest Portable 1 View  Result Date: 05/02/2016 CLINICAL DATA:  Diarrhea beginning today.  Left arm pain.  Seizure. EXAM: PORTABLE CHEST 1 VIEW COMPARISON:  Two-view chest x-ray 01/15/2016 FINDINGS: The heart size is normal. The lungs are clear. The visualized soft tissues and bony thorax are unremarkable. IMPRESSION: Negative one-view chest x-ray Electronically Signed   By: San Morelle M.D.   On: 05/02/2016 20:52  [4 week]   Impression: 45 y/o female  presenting with acute onset coffee ground emesis, melena, epigastric pain in setting of occasional ibuprofen use. Notable decline in Hgb. No further overt bleeding in the last 12 hours. DDx includes M-W tear, gastritis, PUD, less likely malignancy.   Of note, patient is overdue for first ever colonoscopy. Recent abd CT findings as outlined above. Would encourage colonoscopy in the near future as an outpatient.   Plan: 1. IV PPI.  2. EGD today.  I have discussed the risks, alternatives, benefits with regards to but not limited to the risk of reaction to medication, bleeding, infection, perforation and the patient is agreeable to proceed. Written consent to be obtained. Of note, patient developed speech impediment 11/2014 after she removed her knee dressing from  knee surgery and associated with ?syncope. Has not been explained despite neurology work up. Symptoms occurred several days after her surgery.   We would like to thank you for the opportunity to participate in the care of Brittany Durham.  Laureen Ochs. Bernarda Caffey [facility] [phone] 12/21/20179:43 AM     LOS: 1 day

## 2016-05-04 ENCOUNTER — Encounter: Payer: Self-pay | Admitting: Neurology

## 2016-05-04 ENCOUNTER — Inpatient Hospital Stay (HOSPITAL_COMMUNITY): Payer: Medicaid Other

## 2016-05-04 DIAGNOSIS — K921 Melena: Principal | ICD-10-CM

## 2016-05-04 DIAGNOSIS — R1013 Epigastric pain: Secondary | ICD-10-CM

## 2016-05-04 DIAGNOSIS — R4781 Slurred speech: Secondary | ICD-10-CM

## 2016-05-04 LAB — BASIC METABOLIC PANEL
Anion gap: 4 — ABNORMAL LOW (ref 5–15)
BUN: 13 mg/dL (ref 6–20)
CO2: 27 mmol/L (ref 22–32)
Calcium: 8.9 mg/dL (ref 8.9–10.3)
Chloride: 107 mmol/L (ref 101–111)
Creatinine, Ser: 0.74 mg/dL (ref 0.44–1.00)
GFR calc Af Amer: >60 mL/min (ref >60)
GFR calc non Af Amer: >60 mL/min (ref >60)
Glucose, Bld: 116 mg/dL — ABNORMAL HIGH (ref 65–99)
Potassium: 4 mmol/L (ref 3.5–5.1)
Sodium: 138 mmol/L (ref 135–145)

## 2016-05-04 LAB — HEMOGLOBIN AND HEMATOCRIT, BLOOD
HCT: 26.6 % — ABNORMAL LOW (ref 36.0–46.0)
HCT: 28.6 % — ABNORMAL LOW (ref 36.0–46.0)
Hemoglobin: 8.5 g/dL — ABNORMAL LOW (ref 12.0–15.0)
Hemoglobin: 9.3 g/dL — ABNORMAL LOW (ref 12.0–15.0)

## 2016-05-04 MED ORDER — FERROUS SULFATE 325 (65 FE) MG PO TABS: 325.0000 mg | ORAL_TABLET | Freq: Three times a day (TID) | ORAL | 0 refills | Status: DC

## 2016-05-04 MED ORDER — PANTOPRAZOLE SODIUM 40 MG PO TBEC: 40.0000 mg | DELAYED_RELEASE_TABLET | Freq: Two times a day (BID) | ORAL | 0 refills | Status: DC

## 2016-05-04 MED ORDER — PANTOPRAZOLE SODIUM 40 MG PO TBEC: 40.0000 mg | DELAYED_RELEASE_TABLET | Freq: Two times a day (BID) | ORAL | Status: DC

## 2016-05-04 NOTE — Progress Notes (Signed)
Subjective: Doing well. Did have some vomiting last night and now with abdominal muscular soreness. No epigastric pain, no hematemesis. No BM since admission. Overall doing better.  Objective: Vital signs in last 24 hours: Temp:  [97.9 F (36.6 C)-98.6 F (37 C)] 98.4 F (36.9 C) (12/22 0601) Pulse Rate:  [97-142] 102 (12/22 0601) Resp:  [12-24] 18 (12/22 0601) BP: (92-152)/(52-95) 132/79 (12/22 0601) SpO2:  [92 %-100 %] 98 % (12/22 0601) Last BM Date: 05/02/16 General:   Alert and oriented, pleasant. Noted speech impediment with difficulty communicating. Head:  Normocephalic and atraumatic. Eyes:  No icterus, sclera clear. Conjuctiva pink.  Heart:  S1, S2 present, no murmurs noted.  Lungs: Clear to auscultation bilaterally, without wheezing, rales, or rhonchi.  Abdomen:  Bowel sounds present, soft,  non-distended. Minimal lower abdominal TTP. No HSM or hernias noted. No rebound or guarding. No masses appreciated  Msk:  Symmetrical without gross deformities. Extremities:  Without clubbing or edema. Neurologic:  Alert and  oriented x4;  grossly normal neurologically. Skin:  Warm and dry, intact without significant lesions.  Psych:  Alert and cooperative. Normal mood and affect.  Intake/Output from previous day: 12/21 0701 - 12/22 0700 In: 1260 [I.V.:1260] Out: 150 [Emesis/NG output:150] Intake/Output this shift: No intake/output data recorded.  Lab Results:  Recent Labs  05/02/16 1454 05/03/16 0556 05/03/16 1121 05/03/16 1907 05/04/16 0236  WBC 7.7 4.9  --   --   --   HGB 9.8* 8.7* 9.4* 9.0* 8.5*  HCT 30.4* 27.9* 28.3* 27.6* 26.6*  PLT 247 212  --   --   --    BMET  Recent Labs  05/02/16 1454 05/03/16 0556 05/04/16 0236  NA 136 137 138  K 3.7 3.6 4.0  CL 101 104 107  CO2 27 27 27   GLUCOSE 101* 125* 116*  BUN 14 11 13   CREATININE 0.65 0.67 0.74  CALCIUM 9.4 8.8* 8.9   LFT  Recent Labs  05/02/16 1454  PROT 7.6  ALBUMIN 3.9  AST 18  ALT 25   ALKPHOS 61  BILITOT 0.6   PT/INR  Recent Labs  05/03/16 0556  LABPROT 13.8  INR 1.06   Hepatitis Panel No results for input(s): HEPBSAG, HCVAB, HEPAIGM, HEPBIGM in the last 72 hours.   Studies/Results: Mr Brain Wo Contrast  Result Date: 05/04/2016 CLINICAL DATA:  Slurred speech EXAM: MRI HEAD WITHOUT CONTRAST TECHNIQUE: Multiplanar, multiecho pulse sequences of the brain and surrounding structures were obtained without intravenous contrast. COMPARISON:  CT head 12/12/2014.  MRI 12/13/2014 FINDINGS: Brain: Negative for acute infarct. No significant chronic ischemia. Negative for demyelinating disease. Normal white matter. Basal ganglia and brainstem normal. Ventricle size and cerebral volume normal. Negative for hemorrhage or mass. No shift of the midline structures. Vascular: Normal arterial flow voids. Skull and upper cervical spine: Negative Sinuses/Orbits: Negative Other: None IMPRESSION: Negative MRI of the brain. Electronically Signed   By: Franchot Gallo M.D.   On: 05/04/2016 07:41   Dg Chest Portable 1 View  Result Date: 05/02/2016 CLINICAL DATA:  Diarrhea beginning today.  Left arm pain.  Seizure. EXAM: PORTABLE CHEST 1 VIEW COMPARISON:  Two-view chest x-ray 01/15/2016 FINDINGS: The heart size is normal. The lungs are clear. The visualized soft tissues and bony thorax are unremarkable. IMPRESSION: Negative one-view chest x-ray Electronically Signed   By: San Morelle M.D.   On: 05/02/2016 20:52    Assessment: 45 y/o female presenting with acute onset coffee ground emesis, melena, epigastric pain in  setting of occasional ibuprofen use. Notable decline in Hgb. No further overt bleeding in the last 12 hours.  Recent abd CT: mild inflammatory process in LLQ ?acute epiploic appendagitis vs mild acute diverticulitis given ?bowel wall thickening in same area. Treated with cipro/flagyl. Of note, patient is overdue for first ever colonoscopy.   EGD done yesterday 05/03/16 which  found normal esophagus, normal duodenum, medium sized hiatal hernia, one 4-mm non-bleeding gastric ulcer in the antrum and a few localized 7 mm erosions in the antrum. Recommended continue present medications, avoid all NSAIDs, bid PPI at discharge x 12 weeks, follow-up with GI clinic as outpatient in 12 weeks for surveillance EGD for ulcer healing and first ever colonoscopy.  Slight decline in hgb overnight 9.0 -> 8.5. No noted GI bleeding. Possible dilution effect or normalization and "catch up" to initial bleed. No chest pain, dyspnea, dizziness, or other overt signs of symptomatic anemia.   Plan: 1. Continue bid PPI 2. Bid PPI at discharge until she is seen in our office 3. Counseled to avoid ALL NSAIDS and ASA powders 4. 2 month follow-up as outpatient to schedule surveillance EGD and first ever colonoscopy 5. Monitor for recurrent GI bleed 6. Monitor H/H 7. Transfuse as necessary.   Thank you for allowing [place] to participate in the care of [place], DNP, AGNP-C Adult & Gerontological Nurse Practitioner [facility]     LOS: 2 days    05/04/2016, 8:31 AM

## 2016-05-04 NOTE — Discharge Summary (Signed)
Physician Discharge Summary  Brittany Durham [id number] DOB: [birth year] DOA: 05/02/2016  PCP: Evelina Dun, FNP  Admit date: 05/02/2016 Discharge date: 05/04/2016  Time spent: > 35 minutes  Recommendations for Outpatient Follow-up:  1. Patient is to avoid nonsteroidal anti-inflammatory medication 2. Also to follow-up with gastroenterologist within 2 months. Patient is aware and verbalizes understanding 3. Reassess hemoglobin levels   Discharge Diagnoses:  Principal Problem:   Upper GI bleed Active Problems:   HYPERTENSION, BENIGN   GI bleed   Sinus tachycardia   Acute blood loss anemia   Abdominal pain, epigastric   Slurred speech   Discharge Condition: Stable  Diet recommendation: Heart healthy  Filed Weights   05/02/16 1401 05/03/16 0200  Weight: 97.5 kg (215 lb) 97.5 kg (214 lb 15.2 oz)    History of present illness:  Patient is a 45 year old that presented to the hospital with anemia secondary to upper GI bleed.  Hospital Course:  Upper GI bleed - Patient had endoscopy and was treated with Protonix. Gastroenterology was consulted and patient underwent upper endoscopy which revealed hiatal hernia, 4 mm nonbleeding gastric ulcer, and few localized 7 mm erosions in the antrum.  GI evaluated and recommended the following: 1. Continue bid PPI 2. Bid PPI at discharge until she is seen in our office 3. Counseled to avoid ALL NSAIDS and ASA powders 4. 2 month follow-up as outpatient to schedule surveillance EGD and first ever colonoscopy 5. Monitor for recurrent GI bleed 6. Monitor H/H On last check patient's hemoglobin was better than prior. Will discharge on iron supplementation. No reports of continued bleeding by patient. She is looking for to discharge.  Procedures:  Upper endoscopy  Consultations:  Gastroenterologist: Dr. Gordy Levan  Discharge Exam: Vitals:   05/03/16 2232 05/04/16 0601  BP: 126/73 132/79  Pulse: (!) 102 (!) 102  Resp: 18 18  Temp: 97.9  F (36.6 C) 98.4 F (36.9 C)    General: Pt in nad, alert and awake Cardiovascular: rrr, no rubs Respiratory: no increased wob, no wheezes  Discharge Instructions   Discharge Instructions    Call MD for:  severe uncontrolled pain    Complete by:  As directed    Call MD for:  temperature >100.4    Complete by:  As directed    Diet - low sodium heart healthy    Complete by:  As directed    Discharge instructions    Complete by:  As directed    Follow-up in 2 months with your gastroenterologist. Avoid all nonsteroidal anti-inflammatories   Increase activity slowly    Complete by:  As directed      Current Discharge Medication List    START taking these medications   Details  ferrous sulfate 325 (65 FE) MG tablet Take 1 tablet (325 mg total) by mouth 3 (three) times daily with meals. Qty: 90 tablet, Refills: 0    pantoprazole (PROTONIX) 40 MG tablet Take 1 tablet (40 mg total) by mouth 2 (two) times daily before a meal. Qty: 60 tablet, Refills: 0      CONTINUE these medications which have NOT CHANGED   Details  lisinopril-hydrochlorothiazide (ZESTORETIC) 20-12.5 MG per tablet Take 2 tablets by mouth daily. Qty: 180 tablet, Refills: 4   Associated Diagnoses: Essential hypertension, benign    promethazine (PHENERGAN) 25 MG tablet Take 1 tablet (25 mg total) by mouth every 6 (six) hours as needed. Qty: 12 tablet, Refills: 1    Vitamin D, Ergocalciferol, (DRISDOL) 50000 units CAPS capsule TAKE  1 CAPSULE ONCE A WEEK Qty: 12 capsule, Refills: 3    HYDROcodone-acetaminophen (NORCO/VICODIN) 5-325 MG tablet Take 1 tablet by mouth every 6 (six) hours as needed for moderate pain. Qty: 20 tablet, Refills: 0      STOP taking these medications     amLODipine (NORVASC) 10 MG tablet      ciprofloxacin (CIPRO) 500 MG tablet      dexamethasone (DECADRON) 4 MG tablet      methocarbamol (ROBAXIN) 500 MG tablet      metroNIDAZOLE (FLAGYL) 500 MG tablet        No Known  Allergies Follow-up Information    Ranae Pila, NP On 07/18/2016.   Specialty:  Gastroenterology Why:  at 10:30 am Contact information: [address] [place] [address] [phone]            The results of significant diagnostics from this hospitalization (including imaging, microbiology, ancillary and laboratory) are listed below for reference.    Significant Diagnostic Studies: Mr Brain 30 Contrast  Result Date: 05/04/2016 CLINICAL DATA:  Slurred speech EXAM: MRI HEAD WITHOUT CONTRAST TECHNIQUE: Multiplanar, multiecho pulse sequences of the brain and surrounding structures were obtained without intravenous contrast. COMPARISON:  CT head 12/12/2014.  MRI 12/13/2014 FINDINGS: Brain: Negative for acute infarct. No significant chronic ischemia. Negative for demyelinating disease. Normal white matter. Basal ganglia and brainstem normal. Ventricle size and cerebral volume normal. Negative for hemorrhage or mass. No shift of the midline structures. Vascular: Normal arterial flow voids. Skull and upper cervical spine: Negative Sinuses/Orbits: Negative Other: None IMPRESSION: Negative MRI of the brain. Electronically Signed   By: Franchot Gallo M.D.   On: 05/04/2016 07:41   Ct Abdomen Pelvis W Contrast  Result Date: 04/09/2016 CLINICAL DATA:  Left lower quadrant pain for 2 days EXAM: CT ABDOMEN AND PELVIS WITH CONTRAST TECHNIQUE: Multidetector CT imaging of the abdomen and pelvis was performed using the standard protocol following bolus administration of intravenous contrast. CONTRAST:  43mL ISOVUE-300 IOPAMIDOL (ISOVUE-300) INJECTION 61%, 177mL ISOVUE-300 IOPAMIDOL (ISOVUE-300) INJECTION 61% COMPARISON:  03/16/2011 FINDINGS: Lower chest: Lung bases demonstrate patchy dependent atelectasis. No consolidation or pleural effusion. No significant pericardial effusion. Hepatobiliary: No focal liver abnormality is seen. No gallstones, gallbladder wall thickening, or biliary dilatation.  Pancreas: Unremarkable. No pancreatic ductal dilatation or surrounding inflammatory changes. Spleen: Normal in size without focal abnormality. Adrenals/Urinary Tract: Adrenal glands are within normal limits. Multiple punctate nonobstructing stones within the bilateral kidneys. Bladder is unremarkable. Stomach/Bowel: There is no dilated large or small bowel. Numerous colon diverticula are present. Questionable wall thickening versus underdistention of the sigmoid colon. There is mild edema and inflammatory change in the left lower quadrant mesentery. On the coronal views, inflammatory change surrounds a fat containing structure with central increased density. Appendix is visualized and is normal. Vascular/Lymphatic: Non aneurysmal aorta. No abnormally enlarged abdominal or pelvic lymph nodes. Reproductive: The uterus is enlarged and contains multiple masses, some with calcifications, consistent with fibroids. Dominant calcified mass measures approximately 6 by 5.5 cm. No adnexal mass is seen. Other: No free air or free fluid. Musculoskeletal: No acute or significant osseous findings. IMPRESSION: 1. Mild inflammatory process within the left lower quadrant of the abdomen, imaging features are suggestive of an acute epiploic appendagitis. Mild acute diverticulitis is also included in the differential given numerous diverticula in the region and questionable bowel wall thickening versus underdistended bowel. There is no perforation or abscess. 2. Enlarged uterus containing multiple fibroids, some of which are calcified. Electronically Signed  By: Donavan Foil M.D.   On: 04/09/2016 22:13   Dg Chest Portable 1 View  Result Date: 05/02/2016 CLINICAL DATA:  Diarrhea beginning today.  Left arm pain.  Seizure. EXAM: PORTABLE CHEST 1 VIEW COMPARISON:  Two-view chest x-ray 01/15/2016 FINDINGS: The heart size is normal. The lungs are clear. The visualized soft tissues and bony thorax are unremarkable. IMPRESSION: Negative  one-view chest x-ray Electronically Signed   By: San Morelle M.D.   On: 05/02/2016 20:52    Microbiology: No results found for this or any previous visit (from the past 240 hour(s)).   Labs: Basic Metabolic Panel:  Recent Labs Lab 05/02/16 1454 05/03/16 0556 05/04/16 0236  NA 136 137 138  K 3.7 3.6 4.0  CL 101 104 107  CO2 27 27 27   GLUCOSE 101* 125* 116*  BUN 14 11 13   CREATININE 0.65 0.67 0.74  CALCIUM 9.4 8.8* 8.9   Liver Function Tests:  Recent Labs Lab 05/02/16 1454  AST 18  ALT 25  ALKPHOS 61  BILITOT 0.6  PROT 7.6  ALBUMIN 3.9    Recent Labs Lab 05/02/16 1454  LIPASE 19   No results for input(s): AMMONIA in the last 168 hours. CBC:  Recent Labs Lab 05/02/16 1454 05/03/16 0556 05/03/16 1121 05/03/16 1907 05/04/16 0236 05/04/16 1025  WBC 7.7 4.9  --   --   --   --   HGB 9.8* 8.7* 9.4* 9.0* 8.5* 9.3*  HCT 30.4* 27.9* 28.3* 27.6* 26.6* 28.6*  MCV 86.6 87.7  --   --   --   --   PLT 247 212  --   --   --   --    Cardiac Enzymes: No results for input(s): CKTOTAL, CKMB, CKMBINDEX, TROPONINI in the last 168 hours. BNP: BNP (last 3 results) No results for input(s): BNP in the last 8760 hours.  ProBNP (last 3 results) No results for input(s): PROBNP in the last 8760 hours.  CBG: No results for input(s): GLUCAP in the last 168 hours.  Signed:  Velvet Bathe MD.  [facility] 05/04/2016, 1:59 PM

## 2016-05-05 LAB — H. PYLORI ANTIBODY, IGG: H Pylori IgG: <0.9 U/mL (ref 0.0–0.8)

## 2016-05-09 ENCOUNTER — Encounter (HOSPITAL_COMMUNITY): Payer: Self-pay | Admitting: Internal Medicine

## 2016-06-06 ENCOUNTER — Other Ambulatory Visit: Payer: Self-pay

## 2016-06-06 MED ORDER — PANTOPRAZOLE SODIUM 40 MG PO TBEC: 40.0000 mg | DELAYED_RELEASE_TABLET | Freq: Two times a day (BID) | ORAL | 4 refills | Status: DC

## 2016-07-09 ENCOUNTER — Telehealth: Payer: Self-pay | Admitting: Family

## 2016-07-15 ENCOUNTER — Emergency Department (HOSPITAL_COMMUNITY): Payer: Medicaid Other

## 2016-07-15 ENCOUNTER — Encounter (HOSPITAL_COMMUNITY): Payer: Self-pay

## 2016-07-15 ENCOUNTER — Emergency Department (HOSPITAL_COMMUNITY)
Admission: EM | Admit: 2016-07-15 | Discharge: 2016-07-16 | Disposition: A | Discharge status: Home / Self Care | Payer: Medicaid Other | Attending: Emergency Medicine | Admitting: Emergency Medicine

## 2016-07-15 DIAGNOSIS — R0789 Other chest pain: Secondary | ICD-10-CM

## 2016-07-15 DIAGNOSIS — E876 Hypokalemia: Secondary | ICD-10-CM | POA: Diagnosis not present

## 2016-07-15 DIAGNOSIS — I10 Essential (primary) hypertension: Secondary | ICD-10-CM | POA: Insufficient documentation

## 2016-07-15 DIAGNOSIS — R079 Chest pain, unspecified: Secondary | ICD-10-CM | POA: Diagnosis present

## 2016-07-15 DIAGNOSIS — Z79899 Other long term (current) drug therapy: Secondary | ICD-10-CM | POA: Insufficient documentation

## 2016-07-15 LAB — COMPREHENSIVE METABOLIC PANEL
ALT: 26 U/L (ref 14–54)
AST: 28 U/L (ref 15–41)
Albumin: 4.1 g/dL (ref 3.5–5.0)
Alkaline Phosphatase: 62 U/L (ref 38–126)
Anion gap: 7 (ref 5–15)
BUN: 13 mg/dL (ref 6–20)
CO2: 25 mmol/L (ref 22–32)
Calcium: 9.6 mg/dL (ref 8.9–10.3)
Chloride: 105 mmol/L (ref 101–111)
Creatinine, Ser: 0.69 mg/dL (ref 0.44–1.00)
GFR calc Af Amer: >60 mL/min (ref >60)
GFR calc non Af Amer: >60 mL/min (ref >60)
Glucose, Bld: 122 mg/dL — ABNORMAL HIGH (ref 65–99)
Potassium: 3 mmol/L — ABNORMAL LOW (ref 3.5–5.1)
Sodium: 137 mmol/L (ref 135–145)
Total Bilirubin: 0.4 mg/dL (ref 0.3–1.2)
Total Protein: 7.8 g/dL (ref 6.5–8.1)

## 2016-07-15 LAB — CBC WITH DIFFERENTIAL/PLATELET
Basophils Absolute: 0 10*3/uL (ref 0.0–0.1)
Basophils Relative: 0 %
Eosinophils Absolute: 0.1 10*3/uL (ref 0.0–0.7)
Eosinophils Relative: 1 %
HCT: 35 % — ABNORMAL LOW (ref 36.0–46.0)
Hemoglobin: 11.8 g/dL — ABNORMAL LOW (ref 12.0–15.0)
Lymphocytes Relative: 28 %
Lymphs Abs: 2.2 10*3/uL (ref 0.7–4.0)
MCH: 29.1 pg (ref 26.0–34.0)
MCHC: 33.7 g/dL (ref 30.0–36.0)
MCV: 86.2 fL (ref 78.0–100.0)
Monocytes Absolute: 0.7 10*3/uL (ref 0.1–1.0)
Monocytes Relative: 9 %
Neutro Abs: 4.9 10*3/uL (ref 1.7–7.7)
Neutrophils Relative %: 62 %
Platelets: 223 10*3/uL (ref 150–400)
RBC: 4.06 MIL/uL (ref 3.87–5.11)
RDW: 13.6 % (ref 11.5–15.5)
WBC: 7.9 10*3/uL (ref 4.0–10.5)

## 2016-07-15 LAB — TROPONIN I: Troponin I: <0.03 ng/mL (ref <0.03)

## 2016-07-15 LAB — D-DIMER, QUANTITATIVE (NOT AT ARMC): D-Dimer, Quant: 0.31 ug/mL-FEU (ref 0.00–0.50)

## 2016-07-15 LAB — I-STAT TROPONIN, ED: Troponin i, poc: 0 ng/mL (ref 0.00–0.08)

## 2016-07-15 MED ORDER — SODIUM CHLORIDE 0.9 % IV SOLN
INTRAVENOUS | Status: DC
  Administered 2016-07-15: 23:00:00 via INTRAVENOUS

## 2016-07-15 MED ORDER — POTASSIUM CHLORIDE CRYS ER 20 MEQ PO TBCR
20.0000 meq | EXTENDED_RELEASE_TABLET | Freq: Once | ORAL | Status: AC
  Administered 2016-07-16: 20 meq via ORAL
  Filled 2016-07-15: qty 1

## 2016-07-15 MED ORDER — ACETAMINOPHEN 500 MG PO TABS
1000.0000 mg | ORAL_TABLET | Freq: Once | ORAL | Status: AC
  Administered 2016-07-15: 1000 mg via ORAL

## 2016-07-15 MED ORDER — MORPHINE SULFATE (PF) 4 MG/ML IV SOLN
4.0000 mg | Freq: Once | INTRAVENOUS | Status: AC
  Administered 2016-07-16: 4 mg via INTRAVENOUS
  Filled 2016-07-15: qty 1

## 2016-07-15 MED ORDER — ACETAMINOPHEN 500 MG PO TABS
ORAL_TABLET | ORAL | Status: AC
  Administered 2016-07-15: 1000 mg via ORAL
  Filled 2016-07-15: qty 2

## 2016-07-15 NOTE — ED Triage Notes (Signed)
Substernal chest pain that started 2-3 days ago.  Patient having nausea and shortness of breath.  Denies neck, arm, or back pain.  4 baby aspirin and 1 nitro given en route to the hospital.  Initial BP 177/100 and repeat was 130/86.   Pain 8/10, substernal non-radiating.  CBG 142.

## 2016-07-15 NOTE — ED Provider Notes (Signed)
[facility] DEPT Provider Note   CSN: [id number] Arrival date & time: 07/15/16  2145 By signing my name below, I, Dyke Brackett, attest that this documentation has been prepared under the direction and in the presence of non-physician practitioner, Evalee Jefferson, PA-C. Electronically Signed: Dyke Brackett, Scribe. 07/15/2016. 10:14 PM.   History   Chief Complaint Chief Complaint  Patient presents with  . Chest Pain    HPI Brittany Durham is a 46 y.o. female with a history of HTN and seizures who presents to the Emergency Department complaining of sudden onset, nonradiating chest pain which began two days ago.  She describes this as sharp, intermittent pain that is unchanged with position. Pt has taken Tylenol with no relief of pain. No alleviating or modifying factors noted. She notes associated shortness of breath, nausea, and palpitations. Her last meal was at 2 pm today; per pt, she was having chest pain at that time but was able to eat. She has had this pain before. No recent illness, fall, strenuous activity or injury. She is a nonsmoker and denies any increased caffeine intake. Pt denies any URI symptoms, back pain, abdominal pain, vomiting, or fever.   PCP: Evelina Dun, FNP   The history is provided by the patient. No language interpreter was used.   Past Medical History:  Diagnosis Date  . Hypertension   . Knee pain    left meniscus tear s/p repair 11/2014  . Seizures ([facility])   . Stuttering    patient reports this started after her knee surgery in 11/2014.   Marland Kitchen Thyroid nodule     Patient Active Problem List   Diagnosis Date Noted  . Slurred speech   . GI bleed 05/02/2016  . Upper GI bleed 05/02/2016  . Sinus tachycardia 05/02/2016  . Acute blood loss anemia 05/02/2016  . Abdominal pain, epigastric 05/02/2016  . 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 Thyroid nodule 12/14/2014  . Left knee pain 12/14/2014  . Seizures ([facility])   . Altered mental status 12/12/2014  . Syncope 12/12/2014   . Vitamin D deficiency 06/30/2014  . GERD (gastroesophageal reflux disease) 06/29/2014  . SYNCOPE 03/02/2010  . HYPERTENSION, BENIGN 03/01/2010    Past Surgical History:  Procedure Laterality Date  . ESOPHAGOGASTRODUODENOSCOPY N/A 05/03/2016   Procedure: ESOPHAGOGASTRODUODENOSCOPY (EGD);  Surgeon: Daneil Dolin, MD;  Location: [facility] ENDO SUITE;  Service: Endoscopy;  Laterality: N/A;  . KNEE ARTHROSCOPY WITH MEDIAL MENISECTOMY Left 12/09/2014   Procedure: KNEE ARTHROSCOPY WITH PARTIAL MEDIAL AND LATERAL MENISECTOMY;  Surgeon: Sanjuana Kava, MD;  Location: [facility] ORS;  Service: Orthopedics;  Laterality: Left;  . TUBAL LIGATION    . WISDOM TOOTH EXTRACTION      OB History    Gravida Para Term Preterm AB Living   1 1 1     1    SAB TAB Ectopic Multiple Live Births                   Home Medications    Prior to Admission medications   Medication Sig Start Date End Date Taking? Authorizing Provider  acetaminophen (TYLENOL) 500 MG tablet Take 500-1,000 mg by mouth every 6 (six) hours as needed for mild pain or moderate pain.   Yes Historical Provider, MD  amLODipine (NORVASC) 10 MG tablet Take 10 mg by mouth daily.   Yes Historical Provider, MD  lisinopril-hydrochlorothiazide (ZESTORETIC) 20-12.5 MG per tablet Take 2 tablets by mouth daily. 07/19/14  Yes Sharion Balloon, FNP  pantoprazole (PROTONIX) 40 MG tablet  Take 1 tablet (40 mg total) by mouth 2 (two) times daily before a meal. 06/06/16  Yes Sharion Balloon, FNP  Vitamin D, Ergocalciferol, (DRISDOL) 50000 units CAPS capsule TAKE 1 CAPSULE ONCE A WEEK 11/29/15  Yes Sharion Balloon, FNP  HYDROcodone-acetaminophen (NORCO/VICODIN) 5-325 MG tablet Take 1 tablet by mouth every 4 (four) hours as needed. 07/16/16   Evalee Jefferson, PA-C    Family History Family History  Problem Relation Age of Onset  . Hypertension Mother   . Hypertension Brother   . Colon cancer Neg Hx     Social History Social History  Substance Use Topics  . Smoking status:  Never Smoker  . Smokeless tobacco: Never Used  . Alcohol use No     Allergies   Patient has no known allergies.   Review of Systems Review of Systems  Constitutional: Negative for fever.  HENT: Negative.   Respiratory: Positive for shortness of breath.   Cardiovascular: Positive for chest pain and palpitations.  Gastrointestinal: Positive for nausea. Negative for abdominal pain and vomiting.  Musculoskeletal: Negative for back pain.  Skin: Negative for wound.  Allergic/Immunologic: Negative for immunocompromised state.  Hematological: Negative for adenopathy.  Psychiatric/Behavioral: Negative for confusion.    Physical Exam Updated Vital Signs BP 115/74   Pulse 92   Temp 98.1 F (36.7 C) (Oral)   Resp 15   Ht 5\' 7"  (1.702 m)   Wt 98.9 kg   LMP  (LMP Unknown) Comment: has not had a period in 2 months  SpO2 97%   BMI 34.14 kg/m   Physical Exam  Constitutional: She is oriented to person, place, and time. She appears well-developed and well-nourished.  HENT:  Head: Normocephalic and atraumatic.  Eyes: Conjunctivae are normal.  Cardiovascular: Tachycardia present.   Pulmonary/Chest: Effort normal and breath sounds normal. No respiratory distress. She has no wheezes. She has no rales. She exhibits tenderness.  Reproducible mid-sternal chest pain.   Abdominal: Soft. She exhibits no distension. There is no tenderness.  Musculoskeletal:  No ankle edema and no calf tenderness or edema. Dorsalis pedis pulses are full and equal.  Neurological: She is alert and oriented to person, place, and time.  Skin: Skin is warm and dry.  Psychiatric: Her mood appears anxious.  Pt appears anxious  Nursing note and vitals reviewed.  ED Treatments / Results  DIAGNOSTIC STUDIES:  Oxygen Saturation is 100% on RA, normal by my interpretation.    COORDINATION OF CARE:  10:12 PM Discussed treatment plan with pt at bedside and pt agreed to plan.  Labs (all labs ordered are listed, but  only abnormal results are displayed)  Results for orders placed or performed during the hospital encounter of 07/15/16  CBC with Differential  Result Value Ref Range   WBC 7.9 4.0 - 10.5 K/uL   RBC 4.06 3.87 - 5.11 MIL/uL   Hemoglobin 11.8 (L) 12.0 - 15.0 g/dL   HCT 35.0 (L) 36.0 - 46.0 %   MCV 86.2 78.0 - 100.0 fL   MCH 29.1 26.0 - 34.0 pg   MCHC 33.7 30.0 - 36.0 g/dL   RDW 13.6 11.5 - 15.5 %   Platelets 223 150 - 400 K/uL   Neutrophils Relative % 62 %   Neutro Abs 4.9 1.7 - 7.7 K/uL   Lymphocytes Relative 28 %   Lymphs Abs 2.2 0.7 - 4.0 K/uL   Monocytes Relative 9 %   Monocytes Absolute 0.7 0.1 - 1.0 K/uL   Eosinophils Relative 1 %  Eosinophils Absolute 0.1 0.0 - 0.7 K/uL   Basophils Relative 0 %   Basophils Absolute 0.0 0.0 - 0.1 K/uL  Comprehensive metabolic panel  Result Value Ref Range   Sodium 137 135 - 145 mmol/L   Potassium 3.0 (L) 3.5 - 5.1 mmol/L   Chloride 105 101 - 111 mmol/L   CO2 25 22 - 32 mmol/L   Glucose, Bld 122 (H) 65 - 99 mg/dL   BUN 13 6 - 20 mg/dL   Creatinine, Ser 0.69 0.44 - 1.00 mg/dL   Calcium 9.6 8.9 - 10.3 mg/dL   Total Protein 7.8 6.5 - 8.1 g/dL   Albumin 4.1 3.5 - 5.0 g/dL   AST 28 15 - 41 U/L   ALT 26 14 - 54 U/L   Alkaline Phosphatase 62 38 - 126 U/L   Total Bilirubin 0.4 0.3 - 1.2 mg/dL   GFR calc non Af Amer >60 >60 mL/min   GFR calc Af Amer >60 >60 mL/min   Anion gap 7 5 - 15  Troponin I  Result Value Ref Range   Troponin I <0.03 <0.03 ng/mL  D-dimer, quantitative (not at [facility])  Result Value Ref Range   D-Dimer, Quant 0.31 0.00 - 0.50 ug/mL-FEU  I-Stat Troponin, ED (not at [facility])  Result Value Ref Range   Troponin i, poc 0.00 0.00 - 0.08 ng/mL   Comment 3           Dg Chest Portable 1 View  Result Date: 07/15/2016 CLINICAL DATA:  Substernal chest pain starting 2-3 days ago. Nausea and dyspnea. EXAM: PORTABLE CHEST 1 VIEW COMPARISON:  05/02/2016 FINDINGS: The heart size and mediastinal contours are within normal limits. Both  lungs are clear. The visualized skeletal structures are unremarkable. IMPRESSION: No active disease. Electronically Signed   By: Ashley Royalty M.D.   On: 07/15/2016 23:04     EKG  EKG Interpretation  Date/Time:  Sunday July 15 2016 21:53:55 EST Ventricular Rate:  131 PR Interval:    QRS Duration: 79 QT Interval:  307 QTC Calculation: 454 R Axis:   69 Text Interpretation:  Sinus tachycardia Borderline T wave abnormalities When compared with ECG of 01/15/2016 No significant change was found Confirmed by [facility]  MD, Nunzio Cory [phone]) on 07/15/2016 9:56:51 PM       Radiology Dg Chest Portable 1 View  Result Date: 07/15/2016 CLINICAL DATA:  Substernal chest pain starting 2-3 days ago. Nausea and dyspnea. EXAM: PORTABLE CHEST 1 VIEW COMPARISON:  05/02/2016 FINDINGS: The heart size and mediastinal contours are within normal limits. Both lungs are clear. The visualized skeletal structures are unremarkable. IMPRESSION: No active disease. Electronically Signed   By: Ashley Royalty M.D.   On: 07/15/2016 23:04    Procedures Procedures (including critical care time)  Medications Ordered in ED Medications  0.9 %  sodium chloride infusion ( Intravenous New Bag/Given 07/15/16 2245)  morphine 4 MG/ML injection 4 mg (not administered)  potassium chloride SA (K-DUR,KLOR-CON) CR tablet 20 mEq (not administered)  acetaminophen (TYLENOL) tablet 1,000 mg (1,000 mg Oral Given 07/15/16 2216)     Initial Impression / Assessment and Plan / ED Course  I have reviewed the triage vital signs and the nursing notes.  Pertinent labs & imaging results that were available during my care of the patient were reviewed by me and considered in my medical decision making (see chart for details).     Pt with midsternal chest pain, reproducible on exam, present  x 48 hours with negative  troponin, stable ekg and negative range d dimer, ruling out PE. Doubt ACS given reproducible nature of pain and negative workup tonight.  Chart  reveals h/o prior dx of tachycardia.  She was given morphine for relief of pain.  Currently being treated for PUD so withheld nsaids.  She was improved at time of dc. Advised heat tx.  Hydrocodone prescribed for pain.  Plan f/u with pcp for  Recheck if sx persist.  Final Clinical Impressions(s) / ED Diagnoses   Final diagnoses:  Chest wall pain  Hypokalemia    New Prescriptions New Prescriptions   HYDROCODONE-ACETAMINOPHEN (NORCO/VICODIN) 5-325 MG TABLET    Take 1 tablet by mouth every 4 (four) hours as needed.   I personally performed the services described in this documentation, which was scribed in my presence. The recorded information has been reviewed and is accurate.    Evalee Jefferson, PA-C 07/16/16 [place], DO 07/17/16 1454

## 2016-07-16 MED ORDER — HYDROCODONE-ACETAMINOPHEN 5-325 MG PO TABS: 1.0000 | ORAL_TABLET | ORAL | 0 refills | Status: DC | PRN

## 2016-07-16 NOTE — Discharge Instructions (Signed)
Your labs, chest xray and ekg are ok tonight with no sign of any heart or lung problems causing your pain. I suspect you may have musculoskeletal strain causing your symptoms.  You may try a heating pad applied to your chest for 15 minutes several times daily.  You may take the hydrocodone prescribed for pain relief.  This will make you drowsy - do not drive within 4 hours of taking this medication.

## 2016-07-18 ENCOUNTER — Encounter: Payer: Self-pay | Admitting: Nurse Practitioner

## 2016-07-18 ENCOUNTER — Ambulatory Visit (INDEPENDENT_AMBULATORY_CARE_PROVIDER_SITE_OTHER): Payer: Medicaid Other | Admitting: Nurse Practitioner

## 2016-07-18 ENCOUNTER — Other Ambulatory Visit: Payer: Self-pay

## 2016-07-18 VITALS — BP 150/88 | HR 124 | Temp 98.0°F | Ht 62.0 in | Wt 220.0 lb

## 2016-07-18 DIAGNOSIS — R112 Nausea with vomiting, unspecified: Secondary | ICD-10-CM

## 2016-07-18 DIAGNOSIS — K922 Gastrointestinal hemorrhage, unspecified: Secondary | ICD-10-CM | POA: Diagnosis not present

## 2016-07-18 DIAGNOSIS — Z1211 Encounter for screening for malignant neoplasm of colon: Secondary | ICD-10-CM

## 2016-07-18 DIAGNOSIS — K279 Peptic ulcer, site unspecified, unspecified as acute or chronic, without hemorrhage or perforation: Secondary | ICD-10-CM | POA: Diagnosis not present

## 2016-07-18 DIAGNOSIS — R1013 Epigastric pain: Secondary | ICD-10-CM | POA: Diagnosis not present

## 2016-07-18 MED ORDER — SOD PICOSULFATE-MAG OX-CIT ACD 10-3.5-12 MG-GM-GM PO PACK: 1.0000 | PACK | ORAL | 0 refills | Status: DC

## 2016-07-18 MED ORDER — PANTOPRAZOLE SODIUM 40 MG PO TBEC: 40.0000 mg | DELAYED_RELEASE_TABLET | Freq: Two times a day (BID) | ORAL | 2 refills | Status: DC

## 2016-07-18 NOTE — Patient Instructions (Signed)
1. I have refilled her Protonix for you. Take this twice a day for the next 2-3 months. 2. We will schedule your procedures for you. 3. Return for follow-up in 2 months. 4. Call us if you have any worsening symptoms or GI problems.

## 2016-07-18 NOTE — Assessment & Plan Note (Signed)
Coffee-ground emesis in the emergency department and hospital admission. Since discharge she has not had any further nausea or vomiting. Continue to monitor.

## 2016-07-18 NOTE — Assessment & Plan Note (Signed)
Upper GI bleed admission in December 2017. No noted further bleeding. Hemoglobin is significantly improved now in the 11 range. Proceed with endoscopy as per below, return for follow-up in 2 months.

## 2016-07-18 NOTE — Assessment & Plan Note (Signed)
No further abdominal pain. Refill Protonix twice a day for an additional 2 months as well as schedule endoscopy and first-ever screening colonoscopy as per above. Return for follow-up in 2 months.

## 2016-07-18 NOTE — Assessment & Plan Note (Addendum)
Noted peptic ulcer disease during admission for upper GI bleed with endoscopy revealing a nonbleeding ulcer and a few erosions. She was started on Protonix twice a day. However, her discharge medications only included one month supply and she has run out about a month ago. I will refill her Protonix for twice a day for an additional 2 months. We'll also plan to schedule her for surveillance EGD for ulcer healing verification. Return for follow-up in 2 months. Denies any NSAIDs or aspirin powders.  Additionally the patient is due for a first-ever screening colonoscopy. At her previous endoscopy was offered that we could do this at the same time as her surveillance endoscopy. She is agreeable to this and would like to proceed.  Proceed with EGD and TCS with Dr. Gala Romney in near future: the risks, benefits, and alternatives have been discussed with the patient in detail. The patient states understanding and desires to proceed.  The patient is not on any anticoagulants, anxiolytics, chronic pain medications, or antidepressants. Denies alcohol and drug use. Conscious sedation should be adequate for her procedure as it was for her previous procedure.

## 2016-07-18 NOTE — Progress Notes (Signed)
Referring Provider: Sharion Balloon, FNP Primary Care Physician:  Evelina Dun, FNP Primary GI:  Dr. Gala Romney  Chief Complaint  Patient presents with  . gastric ulcer    HPI:   Brittany Durham is a 46 y.o. female who presents for hospital follow-up. The patient was admitted for Upper GI bleed from 05/02/2016 through 05/04/2016. He presented to the ER with melena, coffee-ground emesis, epigastric pain with occasional ibuprofen use and a hemoglobin of 9.8 on admission. He underwent upper endoscopy on 05/03/2016 which found normal esophagus, medium size hiatal hernia, a single nonbleeding cratered gastric ulcer in the antrum measuring 4 mm as well as a few localized 7 mm erosions, normal duodenum. Recommended twice a day Protonix for 12 weeks, avoid NSAIDs, repeat endoscopy in 3 months. H. pylori was negative on biopsy. Her hemoglobin on date of discharge was 9.3. It appears her d/c medications included protonix bid x 3 days with no refills.  Most recent hemoglobin drawn 3 days ago on 07/15/2016 with significant improvement of 11.8.  Today she states she is doing well. No further GERD symptoms. No more bleeding since discharge. Denies abdominal pain, N/V, hematochezia, melena. Denies any further NSAIDs stating "they told me all I could take was Tylenol." Denies ASA powders. She did have some chest pain on Sunday and went to the ER. Cardiology workup complete, ruled out for cardiac involvement and PE. Diagnosed chest wall pain and d/c with pain medication. Denies chest pain, dyspnea, dizziness, lightheadedness, syncope, near syncope. Denies any other upper or lower GI symptoms.  Past Medical History:  Diagnosis Date  . Hypertension   . Knee pain    left meniscus tear s/p repair 11/2014  . Seizures ([place])   . Stuttering    patient reports this started after her knee surgery in 11/2014.   Marland Kitchen Thyroid nodule     Past Surgical History:  Procedure Laterality Date  . ESOPHAGOGASTRODUODENOSCOPY N/A  05/03/2016   Procedure: ESOPHAGOGASTRODUODENOSCOPY (EGD);  Surgeon: Daneil Dolin, MD;  Location: [facility];  Service: Endoscopy;  Laterality: N/A;  . KNEE ARTHROSCOPY WITH MEDIAL MENISECTOMY Left 12/09/2014   Procedure: KNEE ARTHROSCOPY WITH PARTIAL MEDIAL AND LATERAL MENISECTOMY;  Surgeon: Sanjuana Kava, MD;  Location: [facility];  Service: Orthopedics;  Laterality: Left;  . TUBAL LIGATION    . WISDOM TOOTH EXTRACTION      Current Outpatient Prescriptions  Medication Sig Dispense Refill  . acetaminophen (TYLENOL) 500 MG tablet Take 500-1,000 mg by mouth every 6 (six) hours as needed for mild pain or moderate pain.    Marland Kitchen amLODipine (NORVASC) 10 MG tablet Take 10 mg by mouth daily.    Marland Kitchen HYDROcodone-acetaminophen (NORCO/VICODIN) 5-325 MG tablet Take 1 tablet by mouth every 4 (four) hours as needed. 10 tablet 0  . lisinopril-hydrochlorothiazide (ZESTORETIC) 20-12.5 MG per tablet Take 2 tablets by mouth daily. 180 tablet 4  . Potassium 95 MG TABS Take 99 mg by mouth daily.    . Vitamin D, Ergocalciferol, (DRISDOL) 50000 units CAPS capsule TAKE 1 CAPSULE ONCE A WEEK 12 capsule 3  . pantoprazole (PROTONIX) 40 MG tablet Take 1 tablet (40 mg total) by mouth 2 (two) times daily before a meal. (Patient not taking: Reported on 07/18/2016) 60 tablet 4   No current facility-administered medications for this visit.     Allergies as of 07/18/2016  . (No Known Allergies)    Family History  Problem Relation Age of Onset  . Hypertension Mother   . Hypertension Brother   .  Colon cancer Neg Hx     Social History   Social History  . Marital status: Single    Spouse name: N/A  . Number of children: 1  . Years of education: 66   Occupational History  . Umemployed    Social History Main Topics  . Smoking status: Never Smoker  . Smokeless tobacco: Never Used  . Alcohol use No  . Drug use: No  . Sexual activity: Yes    Birth control/ protection: Surgical   Other Topics Concern  . None    Social History Narrative   Lives at home alone.   Right-handed.   Occasional caffeine use.    Review of Systems: General: Negative for anorexia, weight loss, fever, chills, fatigue, weakness. ENT: Negative for hoarseness, difficulty swallowing. CV: Negative for chest pain, angina, palpitations, peripheral edema.  Respiratory: Negative for dyspnea at rest, cough, sputum, wheezing.  GI: See history of present illness. Endo: Negative for unusual weight change.  Heme: Negative for bruising or bleeding.   Physical Exam: BP (!) 150/88   Pulse (!) 124   Temp 98 F (36.7 C) (Oral)   Ht 5\' 2"  (1.575 m)   Wt 220 lb (99.8 kg)   LMP  (LMP Unknown) Comment: has not had a period in 2 months  BMI 40.24 kg/m  General:   Alert and oriented. Pleasant and cooperative. Well-nourished and well-developed.  Head:  Normocephalic and atraumatic. Eyes:  Without icterus, sclera clear and conjunctiva pink.  Ears:  Normal auditory acuity. Cardiovascular:  S1, S2 present without murmurs appreciated. Noted tachycardia, asymptomatic and seemingly chronic. Extremities without clubbing or edema. Respiratory:  Clear to auscultation bilaterally. No wheezes, rales, or rhonchi. No distress.  Gastrointestinal:  +BS, soft, non-tender and non-distended. No HSM noted. No guarding or rebound. No masses appreciated.  Rectal:  Deferred  Musculoskalatal:  Symmetrical without gross deformities. Neurologic:  Alert and oriented x4;  grossly normal neurologically. Psych:  Alert and cooperative. Normal mood and affect. Heme/Lymph/Immune: No excessive bruising noted.    07/18/2016 10:56 AM   Disclaimer: This note was dictated with voice recognition software. Similar sounding words can inadvertently be transcribed and may not be corrected upon review.

## 2016-07-18 NOTE — Progress Notes (Signed)
CC'ED TO PCP 

## 2016-08-01 ENCOUNTER — Ambulatory Visit: Payer: Medicaid Other | Admitting: Neurology

## 2016-08-16 ENCOUNTER — Other Ambulatory Visit: Payer: Self-pay | Admitting: Family

## 2016-08-16 DIAGNOSIS — I10 Essential (primary) hypertension: Secondary | ICD-10-CM

## 2016-08-23 ENCOUNTER — Encounter (HOSPITAL_COMMUNITY)
Admission: RE | Disposition: A | Discharge status: Home / Self Care | Payer: Self-pay | Source: Ambulatory Visit | Attending: Internal Medicine

## 2016-08-23 ENCOUNTER — Ambulatory Visit (HOSPITAL_COMMUNITY)
Admission: RE | Admit: 2016-08-23 | Discharge: 2016-08-23 | Disposition: A | Discharge status: Home / Self Care | Payer: Medicaid Other | Source: Ambulatory Visit | Attending: Internal Medicine | Admitting: Internal Medicine

## 2016-08-23 ENCOUNTER — Encounter (HOSPITAL_COMMUNITY): Payer: Self-pay | Admitting: *Deleted

## 2016-08-23 DIAGNOSIS — R112 Nausea with vomiting, unspecified: Secondary | ICD-10-CM

## 2016-08-23 DIAGNOSIS — K5730 Diverticulosis of large intestine without perforation or abscess without bleeding: Secondary | ICD-10-CM | POA: Insufficient documentation

## 2016-08-23 DIAGNOSIS — Z8249 Family history of ischemic heart disease and other diseases of the circulatory system: Secondary | ICD-10-CM | POA: Insufficient documentation

## 2016-08-23 DIAGNOSIS — Q438 Other specified congenital malformations of intestine: Secondary | ICD-10-CM | POA: Insufficient documentation

## 2016-08-23 DIAGNOSIS — Z8711 Personal history of peptic ulcer disease: Secondary | ICD-10-CM | POA: Diagnosis not present

## 2016-08-23 DIAGNOSIS — I10 Essential (primary) hypertension: Secondary | ICD-10-CM | POA: Insufficient documentation

## 2016-08-23 DIAGNOSIS — Z1211 Encounter for screening for malignant neoplasm of colon: Secondary | ICD-10-CM | POA: Diagnosis not present

## 2016-08-23 DIAGNOSIS — R569 Unspecified convulsions: Secondary | ICD-10-CM | POA: Diagnosis not present

## 2016-08-23 DIAGNOSIS — K449 Diaphragmatic hernia without obstruction or gangrene: Secondary | ICD-10-CM | POA: Insufficient documentation

## 2016-08-23 DIAGNOSIS — Z09 Encounter for follow-up examination after completed treatment for conditions other than malignant neoplasm: Secondary | ICD-10-CM | POA: Insufficient documentation

## 2016-08-23 DIAGNOSIS — K279 Peptic ulcer, site unspecified, unspecified as acute or chronic, without hemorrhage or perforation: Secondary | ICD-10-CM

## 2016-08-23 DIAGNOSIS — Z79899 Other long term (current) drug therapy: Secondary | ICD-10-CM | POA: Diagnosis not present

## 2016-08-23 DIAGNOSIS — Z1212 Encounter for screening for malignant neoplasm of rectum: Secondary | ICD-10-CM | POA: Diagnosis not present

## 2016-08-23 HISTORY — PX: ESOPHAGOGASTRODUODENOSCOPY: SHX5428

## 2016-08-23 HISTORY — PX: COLONOSCOPY: SHX5424

## 2016-08-23 SURGERY — COLONOSCOPY
Anesthesia: Moderate Sedation

## 2016-08-23 MED ORDER — LIDOCAINE HCL 2 % EX GEL
CUTANEOUS | Status: AC
  Filled 2016-08-23: qty 30

## 2016-08-23 MED ORDER — SODIUM CHLORIDE 0.9 % IV SOLN
INTRAVENOUS | Status: DC
  Administered 2016-08-23: 13:00:00 via INTRAVENOUS

## 2016-08-23 MED ORDER — MEPERIDINE HCL 100 MG/ML IJ SOLN
INTRAMUSCULAR | Status: AC
  Filled 2016-08-23: qty 2

## 2016-08-23 MED ORDER — ONDANSETRON HCL 4 MG/2ML IJ SOLN
INTRAMUSCULAR | Status: AC
  Filled 2016-08-23: qty 2

## 2016-08-23 MED ORDER — MIDAZOLAM HCL 5 MG/5ML IJ SOLN
INTRAMUSCULAR | Status: DC | PRN
  Administered 2016-08-23 (×2): 2 mg via INTRAVENOUS
  Administered 2016-08-23 (×2): 1 mg via INTRAVENOUS

## 2016-08-23 MED ORDER — STERILE WATER FOR IRRIGATION IR SOLN
Status: DC | PRN
  Administered 2016-08-23: 13:00:00

## 2016-08-23 MED ORDER — MEPERIDINE HCL 100 MG/ML IJ SOLN
INTRAMUSCULAR | Status: DC | PRN
  Administered 2016-08-23: 25 mg via INTRAVENOUS
  Administered 2016-08-23: 50 mg via INTRAVENOUS
  Administered 2016-08-23: 25 mg via INTRAVENOUS
  Administered 2016-08-23: 50 mg via INTRAVENOUS

## 2016-08-23 MED ORDER — MIDAZOLAM HCL 5 MG/5ML IJ SOLN
INTRAMUSCULAR | Status: AC
  Filled 2016-08-23: qty 10

## 2016-08-23 MED ORDER — ONDANSETRON HCL 4 MG/2ML IJ SOLN
INTRAMUSCULAR | Status: DC | PRN
  Administered 2016-08-23: 4 mg via INTRAVENOUS

## 2016-08-23 MED ORDER — LIDOCAINE VISCOUS 2 % MT SOLN
OROMUCOSAL | Status: AC
  Filled 2016-08-23: qty 15

## 2016-08-23 MED ORDER — LIDOCAINE VISCOUS 2 % MT SOLN
OROMUCOSAL | Status: DC | PRN
  Administered 2016-08-23: 1 via OROMUCOSAL

## 2016-08-23 NOTE — H&P (Signed)
@LOGO @   Primary Care Physician:  Evelina Dun, FNP Primary Gastroenterologist:  Dr. Gala Romney  Pre-Procedure History & Physical: HPI:  Brittany Durham is a 46 y.o. female here for here for a surveillance EGD-history of gastric ulcer. H. pylori negative. Also, here for first ever average risk screening colonoscopy.  Past Medical History:  Diagnosis Date  . Hypertension   . Knee pain    left meniscus tear s/p repair 11/2014  . Seizures ([place])   . Stuttering    patient reports this started after her knee surgery in 11/2014.   Marland Kitchen Thyroid nodule     Past Surgical History:  Procedure Laterality Date  . ESOPHAGOGASTRODUODENOSCOPY N/A 05/03/2016   Procedure: ESOPHAGOGASTRODUODENOSCOPY (EGD);  Surgeon: Daneil Dolin, MD;  Location[facility];  Service: Endoscopy;  Laterality: N/A;  . KNEE ARTHROSCOPY WITH MEDIAL MENISECTOMY Left 12/09/2014   Procedure: KNEE ARTHROSCOPY WITH PARTIAL MEDIAL AND LATERAL MENISECTOMY;  Surgeon: Sanjuana Kava, MD;  Location: [facility];  Service: Orthopedics;  Laterality: Left;  . TUBAL LIGATION    . WISDOM TOOTH EXTRACTION      Prior to Admission medications   Medication Sig Start Date End Date Taking? Authorizing Provider  acetaminophen (TYLENOL) 500 MG tablet Take 1,000 mg by mouth every 6 (six) hours as needed for mild pain or moderate pain.    Yes Historical Provider, MD  amLODipine (NORVASC) 10 MG tablet TAKE 1 TABLET ONCE A DAY 08/16/16  Yes Sharion Balloon, FNP  lisinopril-hydrochlorothiazide (PRINZIDE,ZESTORETIC) 20-12.5 MG tablet TAKE 2 TABLETS ONCE A DAY 08/16/16  Yes Sharion Balloon, FNP  pantoprazole (PROTONIX) 40 MG tablet Take 1 tablet (40 mg total) by mouth 2 (two) times daily before a meal. 07/18/16  Yes Carlis Stable, NP  Potassium 95 MG TABS Take 99 mg by mouth daily.   Yes Historical Provider, MD  Sod Picosulfate-Mag Ox-Cit Acd ([place]) 10-3.5-12 MG-GM-GM PACK Take 1 Container by mouth as directed. Patient taking differently: Take 1 Container by mouth as  directed. WILL START PRIOR TO PROCEDURE 07/18/16  Yes Daneil Dolin, MD  Vitamin D, Ergocalciferol, (DRISDOL) 50000 units CAPS capsule TAKE 1 CAPSULE ONCE A WEEK Patient taking differently: TAKE 1 CAPSULE ONCE A WEEK ON THURSDAYS 11/29/15  Yes Sharion Balloon, FNP  HYDROcodone-acetaminophen (NORCO/VICODIN) 5-325 MG tablet Take 1 tablet by mouth every 4 (four) hours as needed. Patient taking differently: Take 1 tablet by mouth 2 (two) times daily as needed for moderate pain.  07/16/16   Evalee Jefferson, PA-C    Allergies as of 07/18/2016  . (No Known Allergies)    Family History  Problem Relation Age of Onset  . Hypertension Mother   . Hypertension Brother   . Colon cancer Neg Hx     Social History   Social History  . Marital status: Single    Spouse name: N/A  . Number of children: 1  . Years of education: 81   Occupational History  . Umemployed    Social History Main Topics  . Smoking status: Never Smoker  . Smokeless tobacco: Never Used  . Alcohol use No  . Drug use: No  . Sexual activity: Yes    Birth control/ protection: Surgical   Other Topics Concern  . Not on file   Social History Narrative   Lives at home alone.   Right-handed.   Occasional caffeine use.    Review of Systems: See HPI, otherwise negative ROS  Physical Exam: BP 132/79   Pulse Marland Kitchen)  111   Temp 97.9 F (36.6 C) (Oral)   Resp 19   Ht 5\' 7"  (1.702 m)   Wt 220 lb (99.8 kg)   LMP 08/13/2016   SpO2 96%   BMI 34.46 kg/m  General:   Alert,  Well-developed, well-nourished, pleasant and cooperative in NAD Neck:  Supple; no masses or thyromegaly. No significant cervical adenopathy. Lungs:  Clear throughout to auscultation.   No wheezes, crackles, or rhonchi. No acute distress. Heart:  Regular rate and rhythm; no murmurs, clicks, rubs,  or gallops. Abdomen: Non-distended, normal bowel sounds.  Soft and nontender without appreciable mass or hepatosplenomegaly.  Pulses:  Normal pulses  noted. Extremities:  Without clubbing or edema.  Impression:  Pleasant 46 year old with history of peptic ulcer disease likely NSAID related. Here for surveillance EGD to verify healing. Also, presents for first ever average risk screening colonoscopy.  Recommendations:  I have offere the patient both a surveillance EGD and a screening colonoscopy today.  The risks, benefits, limitations, imponderables and alternatives regarding both EGD and colonoscopy have been reviewed with the patient. Questions have been answered. All parties agreeable.    Notice: This dictation was prepared with Dragon dictation along with smaller phrase technology. Any transcriptional errors that result from this process are unintentional and may not be corrected upon review.

## 2016-08-23 NOTE — Op Note (Signed)
[facility] Patient Name: Brittany Durham Procedure Date: 08/23/2016 1:05 PM MRN: [id number] Date of Birth: Sep 23, 1970 Attending MD: Norvel Richards , MD CSN: [id number] Age: 46 Admit Type: Outpatient Procedure:                Colonoscopy Indications:              Screening for colorectal malignant neoplasm Providers:                Norvel Richards, MD, Hinton Rao, RN, Aram Candela Referring MD:              Medicines:                Midazolam 4 mg IV, Meperidine 150 mg IV,                            Ondansetron 4 mg IV Complications:            No immediate complications. Estimated Blood Loss:     Estimated blood loss: none. Procedure:                Pre-Anesthesia Assessment:                           - Prior to the procedure, a History and Physical                            was performed, and patient medications and                            allergies were reviewed. The patient's tolerance of                            previous anesthesia was also reviewed. The risks                            and benefits of the procedure and the sedation                            options and risks were discussed with the patient.                            All questions were answered, and informed consent                            was obtained. Prior Anticoagulants: The patient has                            taken no previous anticoagulant or antiplatelet                            agents. ASA Grade Assessment: II - A patient with  mild systemic disease. After reviewing the risks                            and benefits, the patient was deemed in                            satisfactory condition to undergo the procedure.                           After obtaining informed consent, the colonoscope                            was passed under direct vision. Throughout the                            procedure, the patient's blood  pressure, pulse, and                            oxygen saturations were monitored continuously. The                            EC-[id number] ([id number]) scope was introduced through                            the anus and advanced to the the cecum, identified                            by appendiceal orifice and ileocecal valve. The                            colonoscopy was performed without difficulty. The                            patient tolerated the procedure well. The quality                            of the bowel preparation was adequate. The entire                            colon was well visualized. The ileocecal valve,                            appendiceal orifice, and rectum were photographed. Scope In: 1:10:10 PM Scope Out: 1:22:19 PM Scope Withdrawal Time: 0 hours 6 minutes 49 seconds  Total Procedure Duration: 0 hours 12 minutes 9 seconds  Findings:      The perianal and digital rectal examinations were normal.      Scattered small and large-mouthed diverticula were found in the entire       colon. Redundant colon      The exam was otherwise without abnormality on direct and retroflexion       views. Impression:               - Diverticulosis in the entire examined colon.  Redundant colon                           - The examination was otherwise normal on direct                            and retroflexion views.                           - No specimens collected. Moderate Sedation:      Moderate (conscious) sedation was administered by the endoscopy nurse       and supervised by the endoscopist. The following parameters were       monitored: oxygen saturation, heart rate, blood pressure, respiratory       rate, EKG, adequacy of pulmonary ventilation, and response to care.       Total physician intraservice time was 29 minutes. Recommendation:           - Patient has a contact number available for                            emergencies. The signs  and symptoms of potential                            delayed complications were discussed with the                            patient. Return to normal activities tomorrow.                            Written discharge instructions were provided to the                            patient.                           - Resume previous diet.                           - Continue present medications.                           - Repeat colonoscopy in 10 years for screening                            purposes.                           - Return to GI clinic in 1 year. Procedure Code(s):        --- Professional ---                           [phone], Colonoscopy, flexible; diagnostic, including                            collection of specimen(s) by brushing or washing,  when performed (separate procedure)                           99152, Moderate sedation services provided by the                            same physician or other qualified health care                            professional performing the diagnostic or                            therapeutic service that the sedation supports,                            requiring the presence of an independent trained                            observer to assist in the monitoring of the                            patient's level of consciousness and physiological                            status; initial 15 minutes of intraservice time,                            patient age 10 years or older                           [phone], Moderate sedation services; each additional                            15 minutes intraservice time Diagnosis Code(s):        --- Professional ---                           Z12.11, Encounter for screening for malignant                            neoplasm of colon                           K57.30, Diverticulosis of large intestine without                            perforation or abscess without  bleeding CPT copyright 2016 American [employer]. All rights reserved. The codes documented in this report are preliminary and upon coder review may  be revised to meet current compliance requirements. Cristopher Estimable. Rourk, MD Norvel Richards, MD 08/23/2016 1:45:34 PM This report has been signed electronically. Number of Addenda: 0

## 2016-08-23 NOTE — Op Note (Signed)
[facility] Patient Name: Brittany Durham Procedure Date: 08/23/2016 11:32 AM MRN: [id number] Date of Birth: 1970/06/10 Attending MD: Gennette Pac , MD CSN: [id number] Age: 46 Admit Type: Outpatient Procedure:                Upper GI endoscopy Indications:              Surveillance procedure Providers:                Gennette Pac, MD, Judee Clara, RN, Dyann Ruddle Referring MD:              Medicines:                Midazolam 6 mg IV, Meperidine 150 mg IV,                            Ondansetron 4 mg IV Complications:            No immediate complications. Estimated Blood Loss:     Estimated blood loss: none. Procedure:                Pre-Anesthesia Assessment:                           - Prior to the procedure, a History and Physical                            was performed, and patient medications and                            allergies were reviewed. The patient's tolerance of                            previous anesthesia was also reviewed. The risks                            and benefits of the procedure and the sedation                            options and risks were discussed with the patient.                            All questions were answered, and informed consent                            was obtained. Prior Anticoagulants: The patient has                            taken no previous anticoagulant or antiplatelet                            agents. ASA Grade Assessment: II - A patient with  mild systemic disease. After reviewing the risks                            and benefits, the patient was deemed in                            satisfactory condition to undergo the procedure.                           After obtaining informed consent, the endoscope was                            passed under direct vision. Throughout the                            procedure, the patient's blood pressure,  pulse, and                            oxygen saturations were monitored continuously. The                            [id number] ([id number]) was introduced through the                            mouth, and advanced to the second part of duodenum.                            The upper GI endoscopy was accomplished without                            difficulty. The patient tolerated the procedure                            well. Scope In: 1:02:52 PM Scope Out: 1:05:35 PM Total Procedure Duration: 0 hours 2 minutes 43 seconds  Findings:      The examined esophagus was normal.      A medium-sized hiatal hernia was present.      The cardia and gastric fundus were normal on retroflexion.      The duodenal bulb and second portion of the duodenum were normal.       Previously noted peptic ulcer disease completely healed. Impression:               - Normal esophagus.                           - Medium-sized hiatal hernia.                           - No specimens collected. Moderate Sedation:      Moderate (conscious) sedation was administered by the endoscopy nurse       and supervised by the endoscopist. The following parameters were       monitored: oxygen saturation, heart rate, blood pressure, respiratory       rate, EKG, adequacy of pulmonary ventilation, and response to care.  Total physician intraservice time was 29 minutes. Recommendation:           - Patient has a contact number available for                            emergencies. The signs and symptoms of potential                            delayed complications were discussed with the                            patient. Return to normal activities tomorrow.                            Written discharge instructions were provided to the                            patient.                           - Resume previous diet.                           - Continue present medications. Decrease Protonix                            to 40 mg  once daily. Avoid nonsteroidals moving                            forward. Repeat colonoscopy for screening purposes                            in 10 years.                           - No repeat upper endoscopy.                           - Return to GI office in 1 year. Procedure Code(s):        --- Professional ---                           [phone], Esophagogastroduodenoscopy, flexible,                            transoral; diagnostic, including collection of                            specimen(s) by brushing or washing, when performed                            (separate procedure)                           99152, Moderate sedation services provided by the  same physician or other qualified health care                            professional performing the diagnostic or                            therapeutic service that the sedation supports,                            requiring the presence of an independent trained                            observer to assist in the monitoring of the                            patient's level of consciousness and physiological                            status; initial 15 minutes of intraservice time,                            patient age 75 years or older                           [phone], Moderate sedation services; each additional                            15 minutes intraservice time Diagnosis Code(s):        --- Professional ---                           K44.9, Diaphragmatic hernia without obstruction or                            gangrene CPT copyright 2016 American Medical Association. All rights reserved. The codes documented in this report are preliminary and upon coder review may  be revised to meet current compliance requirements. Gerrit Friends. Lealand Elting, MD Gennette Pac, MD 08/23/2016 1:33:32 PM This report has been signed electronically. Number of Addenda: 0

## 2016-08-23 NOTE — Discharge Instructions (Addendum)
°Colonoscopy °Discharge Instructions ° °Read the instructions outlined below and refer to this sheet in the next few weeks. These discharge instructions provide you with general information on caring for yourself after you leave the hospital. Your doctor may also give you specific instructions. While your treatment has been planned according to the most current medical practices available, unavoidable complications occasionally occur. If you have any problems or questions after discharge, call Dr. Rourk at [phone]. °ACTIVITY °· You may resume your regular activity, but move at a slower pace for the next 24 hours.  °· Take frequent rest periods for the next 24 hours.  °· Walking will help get rid of the air and reduce the bloated feeling in your belly (abdomen).  °· No driving for 24 hours (because of the medicine (anesthesia) used during the test).   °· Do not sign any important legal documents or operate any machinery for 24 hours (because of the anesthesia used during the test).  °NUTRITION °· Drink plenty of fluids.  °· You may resume your normal diet as instructed by your doctor.  °· Begin with a light meal and progress to your normal diet. Heavy or fried foods are harder to digest and may make you feel sick to your stomach (nauseated).  °· Avoid alcoholic beverages for 24 hours or as instructed.  °MEDICATIONS °· You may resume your normal medications unless your doctor tells you otherwise.  °WHAT YOU CAN EXPECT TODAY °· Some feelings of bloating in the abdomen.  °· Passage of more gas than usual.  °· Spotting of blood in your stool or on the toilet paper.  °IF YOU HAD POLYPS REMOVED DURING THE COLONOSCOPY: °· No aspirin products for 7 days or as instructed.  °· No alcohol for 7 days or as instructed.  °· Eat a soft diet for the next 24 hours.  °FINDING OUT THE RESULTS OF YOUR TEST °Not all test results are available during your visit. If your test results are not back during the visit, make an appointment  with your caregiver to find out the results. Do not assume everything is normal if you have not heard from your caregiver or the medical facility. It is important for you to follow up on all of your test results.  °SEEK IMMEDIATE MEDICAL ATTENTION IF: °· You have more than a spotting of blood in your stool.  °· Your belly is swollen (abdominal distention).  °· You are nauseated or vomiting.  °· You have a temperature over 101.  °· You have abdominal pain or discomfort that is severe or gets worse throughout the day.  °EGD °Discharge instructions °Please read the instructions outlined below and refer to this sheet in the next few weeks. These discharge instructions provide you with general information on caring for yourself after you leave the hospital. Your doctor may also give you specific instructions. While your treatment has been planned according to the most current medical practices available, unavoidable complications occasionally occur. If you have any problems or questions after discharge, please call your doctor. °ACTIVITY °· You may resume your regular activity but move at a slower pace for the next 24 hours.  °· Take frequent rest periods for the next 24 hours.  °· Walking will help expel (get rid of) the air and reduce the bloated feeling in your abdomen.  °· No driving for 24 hours (because of the anesthesia (medicine) used during the test).  °· You may shower.  °· Do not sign any important   legal documents or operate any machinery for 24 hours (because of the anesthesia used during the test).  NUTRITION  Drink plenty of fluids.   You may resume your normal diet.   Begin with a light meal and progress to your normal diet.   Avoid alcoholic beverages for 24 hours or as instructed by your caregiver.  MEDICATIONS  You may resume your normal medications unless your caregiver tells you otherwise.  WHAT YOU CAN EXPECT TODAY  You may experience abdominal discomfort such as a feeling of fullness  or gas pains.  FOLLOW-UP  Your doctor will discuss the results of your test with you.  SEEK IMMEDIATE MEDICAL ATTENTION IF ANY OF THE FOLLOWING OCCUR:  Excessive nausea (feeling sick to your stomach) and/or vomiting.   Severe abdominal pain and distention (swelling).   Trouble swallowing.   Temperature over 101 F (37.8 C).   Rectal bleeding or vomiting of blood.     Colon diverticulosis and GERD information provided   Repeat colonoscopy in 10 years for screening purposes   Increase Protonix to 40 mg daily.   Office visit with [place] in one year

## 2016-08-27 ENCOUNTER — Encounter (HOSPITAL_COMMUNITY): Payer: Self-pay | Admitting: Internal Medicine

## 2016-09-05 ENCOUNTER — Telehealth: Payer: Self-pay | Admitting: *Deleted

## 2016-09-05 NOTE — Telephone Encounter (Signed)
Spoke to pt's dgt - she has accepted the earlier appt time.

## 2016-09-05 NOTE — Telephone Encounter (Signed)
Left message for a return call asking her to change her appt time on 09/25/16 from 3pm to 1:30pm (arrival time 1pm).

## 2016-09-14 ENCOUNTER — Other Ambulatory Visit: Payer: Self-pay | Admitting: Family

## 2016-09-14 DIAGNOSIS — I10 Essential (primary) hypertension: Secondary | ICD-10-CM

## 2016-09-17 ENCOUNTER — Encounter: Payer: Self-pay | Admitting: Nurse Practitioner

## 2016-09-17 ENCOUNTER — Ambulatory Visit (INDEPENDENT_AMBULATORY_CARE_PROVIDER_SITE_OTHER): Payer: Medicaid Other | Admitting: Nurse Practitioner

## 2016-09-17 VITALS — BP 158/98 | HR 110 | Temp 96.6°F | Ht 67.0 in | Wt 222.0 lb

## 2016-09-17 DIAGNOSIS — K279 Peptic ulcer, site unspecified, unspecified as acute or chronic, without hemorrhage or perforation: Secondary | ICD-10-CM

## 2016-09-17 DIAGNOSIS — R112 Nausea with vomiting, unspecified: Secondary | ICD-10-CM

## 2016-09-17 DIAGNOSIS — K922 Gastrointestinal hemorrhage, unspecified: Secondary | ICD-10-CM

## 2016-09-17 LAB — CBC WITH DIFFERENTIAL/PLATELET
Basophils Absolute: 0 cells/uL (ref 0–200)
Basophils Relative: 0 %
Eosinophils Absolute: 58 cells/uL (ref 15–500)
Eosinophils Relative: 1 %
HCT: 35.4 % (ref 35.0–45.0)
Hemoglobin: 11.4 g/dL — ABNORMAL LOW (ref 11.7–15.5)
Lymphocytes Relative: 33 %
Lymphs Abs: 1914 cells/uL (ref 850–3900)
MCH: 28.6 pg (ref 27.0–33.0)
MCHC: 32.2 g/dL (ref 32.0–36.0)
MCV: 88.7 fL (ref 80.0–100.0)
MPV: 10 fL (ref 7.5–12.5)
Monocytes Absolute: 348 cells/uL (ref 200–950)
Monocytes Relative: 6 %
Neutro Abs: 3480 cells/uL (ref 1500–7800)
Neutrophils Relative %: 60 %
Platelets: 230 10*3/uL (ref 140–400)
RBC: 3.99 MIL/uL (ref 3.80–5.10)
RDW: 13.7 % (ref 11.0–15.0)
WBC: 5.8 10*3/uL (ref 3.8–10.8)

## 2016-09-17 NOTE — Progress Notes (Signed)
Referring Provider: Sharion Balloon, FNP Primary Care Physician:  Sharion Balloon, FNP Primary GI:  Dr. Gala Romney  Chief Complaint  Patient presents with  . PUD    f/u, doing ok    HPI:   Brittany Durham is a 46 y.o. female who presents for follow-up on peptic ulcer disease. The patient was last seen in our office 07/18/2016 for peptic ulcer disease, upper GI bleed, abdominal pain, nausea/vomiting. At that time it was noted the patient was admitted the hospital from 05/02/2016 through 05/04/2016 with melena, coffee-ground emesis, epigastric pain, ibuprofen use and a hemoglobin of 9.8. Upper endoscopy completed 05/03/2016 which found a nonbleeding cratered gastric ulcer in the antrum measuring 4 mm as well as a few localized 7 mm erosions. He was started on Protonix twice a day, avoid NSAIDs, repeat endoscopy in 3 months. H. pylori negative. At her last visit she was doing well, no further symptoms, most recent hemoglobin 11.8. Stopped all NSAIDs. Her discharge instructions from the hospital only had her on twice a day PPI for 3 days. I refilled her Protonix for twice a day for the next 2-3 months. She was due for colonoscopy and this was scheduled simultaneously with her surveillance endoscopy.  Surveillance endoscopy completed 08/23/2016 which found normal esophagus, previously noted peptic ulcer disease completely healed, normal gastric mucosa, normal duodenum, medium size hiatal hernia. Colonoscopy completed same time found diverticulosis in the entire examined colon, redundant colon, otherwise normal. Repeat colonoscopy in 10 years for screening, return for follow-up in one year.  Today she states she's doing well. Denies abdominal pain, GERD symptoms, hematochezia, melena, N/V. Does have some fatigue. Denies chest pain, dyspnea, dizziness, lightheadedness, syncope, near syncope. Denies any other upper or lower GI symptoms.  Past Medical History:  Diagnosis Date  . Hypertension   . Knee  pain    left meniscus tear s/p repair 11/2014  . Seizures ([place])   . Stuttering    patient reports this started after her knee surgery in 11/2014.   Marland Kitchen Thyroid nodule     Past Surgical History:  Procedure Laterality Date  . COLONOSCOPY N/A 08/23/2016   Procedure: COLONOSCOPY;  Surgeon: Daneil Dolin, MD;  [facility];  Service: Endoscopy;  Laterality: N/A;  1:00pm  . ESOPHAGOGASTRODUODENOSCOPY N/A 05/03/2016   Procedure: ESOPHAGOGASTRODUODENOSCOPY (EGD);  Surgeon: Daneil Dolin, MD;  [facility];  Service: Endoscopy;  Laterality: N/A;  . ESOPHAGOGASTRODUODENOSCOPY N/A 08/23/2016   Procedure: ESOPHAGOGASTRODUODENOSCOPY (EGD);  Surgeon: Daneil Dolin, MD;  [facility];  Service: Endoscopy;  Laterality: N/A;  . KNEE ARTHROSCOPY WITH MEDIAL MENISECTOMY Left 12/09/2014   Procedure: KNEE ARTHROSCOPY WITH PARTIAL MEDIAL AND LATERAL MENISECTOMY;  Surgeon: Sanjuana Kava, MD;  Location: [facility];  Service: Orthopedics;  Laterality: Left;  . TUBAL LIGATION    . WISDOM TOOTH EXTRACTION      Current Outpatient Prescriptions  Medication Sig Dispense Refill  . acetaminophen (TYLENOL) 500 MG tablet Take 1,000 mg by mouth every 6 (six) hours as needed for mild pain or moderate pain.     Marland Kitchen amLODipine (NORVASC) 10 MG tablet TAKE 1 TABLET ONCE A DAY 30 tablet 0  . HYDROcodone-acetaminophen (NORCO/VICODIN) 5-325 MG tablet Take 1 tablet by mouth every 4 (four) hours as needed. (Patient taking differently: Take 1 tablet by mouth 2 (two) times daily as needed for moderate pain. ) 10 tablet 0  . lisinopril-hydrochlorothiazide (PRINZIDE,ZESTORETIC) 20-12.5 MG tablet TAKE 2 TABLETS ONCE A DAY 60  tablet 0  . pantoprazole (PROTONIX) 40 MG tablet Take 1 tablet (40 mg total) by mouth 2 (two) times daily before a meal. 60 tablet 2  . Potassium 95 MG TABS Take 99 mg by mouth daily.    . Vitamin D, Ergocalciferol, (DRISDOL) 50000 units CAPS capsule TAKE 1 CAPSULE ONCE A WEEK (Patient taking  differently: TAKE 1 CAPSULE ONCE A WEEK ON THURSDAYS) 12 capsule 3   No current facility-administered medications for this visit.     Allergies as of 09/17/2016  . (No Known Allergies)    Family History  Problem Relation Age of Onset  . Hypertension Mother   . Hypertension Brother   . Colon cancer Neg Hx     Social History   Social History  . Marital status: Single    Spouse name: N/A  . Number of children: 1  . Years of education: 31   Occupational History  . Umemployed    Social History Main Topics  . Smoking status: Never Smoker  . Smokeless tobacco: Never Used  . Alcohol use No  . Drug use: No  . Sexual activity: Yes    Birth control/ protection: Surgical   Other Topics Concern  . None   Social History Narrative   Lives at home alone.   Right-handed.   Occasional caffeine use.    Review of Systems: General: Negative for anorexia, weight loss, fever, chills, fatigue, weakness. ENT: Negative for hoarseness, difficulty swallowing. CV: Negative for chest pain, angina, palpitations, peripheral edema.  Respiratory: Negative for dyspnea at rest, cough, sputum, wheezing.  GI: See history of present illness. Endo: Negative for unusual weight change.  Heme: Negative for bruising or bleeding.   Physical Exam: BP (!) 158/98   Pulse (!) 110   Temp (!) 96.6 F (35.9 C) (Oral)   Ht 5\' 7"  (1.702 m)   Wt 222 lb (100.7 kg)   LMP 08/27/2016 (Approximate)   BMI 34.77 kg/m  General:   Alert and oriented. Pleasant and cooperative. Well-nourished and well-developed.  Eyes:  Without icterus, sclera clear and conjunctiva pink.  Ears:  Normal auditory acuity. Cardiovascular:  S1, S2 present without murmurs appreciated. Extremities without clubbing or edema. Respiratory:  Clear to auscultation bilaterally. No wheezes, rales, or rhonchi. No distress.  Gastrointestinal:  +BS, soft, non-tender and non-distended. No HSM noted. No guarding or rebound. No masses appreciated.    Rectal:  Deferred  Musculoskalatal:  Symmetrical without gross deformities. Neurologic:  Alert and oriented x4;  grossly normal neurologically. Psych:  Alert and cooperative. Normal mood and affect. Heme/Lymph/Immune: No excessive bruising noted.    09/17/2016 10:01 AM   Disclaimer: This note was dictated with voice recognition software. Similar sounding words can inadvertently be transcribed and may not be corrected upon review.

## 2016-09-17 NOTE — Assessment & Plan Note (Signed)
Last hemoglobin approximately 1-1-1/2 months ago which showed consistent improvement at 11.8. I will recheck her CBC today to ensure her hemoglobin is stable or improving. PPI management as per below. Return for follow-up in one year. Call if any worsening symptoms.

## 2016-09-17 NOTE — Assessment & Plan Note (Addendum)
Peptic ulcer disease resolved on twice a day PPI and NSAID avoidance. Endoscopically cleared. Decrease Protonix to once a day. Return for follow-up in one year, or sooner if worsening symptoms.

## 2016-09-17 NOTE — Patient Instructions (Signed)
1. Continue to avoid all NSAID medications such as ibuprofen, Motrin, Advil, Aleve, naproxen, Naprosyn. 2. Avoid all aspirin powders like Goody powder and BC powder. 3. Have your blood work checked when you're able to. 4. You can decrease your Protonix to once a day. 5. Return for follow-up in one year. 6. Call us if you have any worsening symptoms before then.

## 2016-09-17 NOTE — Progress Notes (Signed)
CC'ED TO PCP 

## 2016-09-17 NOTE — Assessment & Plan Note (Signed)
Nausea and vomiting resolved. Continues on Protonix twice a day. PPI treatment details above. Return for follow-up in one year.

## 2016-09-21 ENCOUNTER — Observation Stay (HOSPITAL_BASED_OUTPATIENT_CLINIC_OR_DEPARTMENT_OTHER): Payer: Medicaid Other

## 2016-09-21 ENCOUNTER — Encounter (HOSPITAL_COMMUNITY): Payer: Self-pay | Admitting: Emergency Medicine

## 2016-09-21 ENCOUNTER — Emergency Department (HOSPITAL_COMMUNITY): Payer: Medicaid Other

## 2016-09-21 ENCOUNTER — Observation Stay (HOSPITAL_COMMUNITY)
Admission: EM | Admit: 2016-09-21 | Discharge: 2016-09-22 | Disposition: A | Discharge status: Home / Self Care | Payer: Medicaid Other | Attending: Internal Medicine | Admitting: Internal Medicine

## 2016-09-21 DIAGNOSIS — R42 Dizziness and giddiness: Secondary | ICD-10-CM | POA: Diagnosis present

## 2016-09-21 DIAGNOSIS — R4020 Unspecified coma: Secondary | ICD-10-CM

## 2016-09-21 DIAGNOSIS — R4781 Slurred speech: Secondary | ICD-10-CM | POA: Diagnosis present

## 2016-09-21 DIAGNOSIS — R002 Palpitations: Secondary | ICD-10-CM | POA: Insufficient documentation

## 2016-09-21 DIAGNOSIS — R55 Syncope and collapse: Secondary | ICD-10-CM

## 2016-09-21 DIAGNOSIS — Z79899 Other long term (current) drug therapy: Secondary | ICD-10-CM | POA: Insufficient documentation

## 2016-09-21 DIAGNOSIS — I10 Essential (primary) hypertension: Secondary | ICD-10-CM | POA: Diagnosis present

## 2016-09-21 DIAGNOSIS — R05 Cough: Secondary | ICD-10-CM | POA: Insufficient documentation

## 2016-09-21 DIAGNOSIS — K219 Gastro-esophageal reflux disease without esophagitis: Secondary | ICD-10-CM | POA: Diagnosis not present

## 2016-09-21 DIAGNOSIS — R000 Tachycardia, unspecified: Secondary | ICD-10-CM | POA: Diagnosis not present

## 2016-09-21 LAB — BASIC METABOLIC PANEL
Anion gap: 7 (ref 5–15)
BUN: 13 mg/dL (ref 6–20)
CO2: 29 mmol/L (ref 22–32)
Calcium: 9.1 mg/dL (ref 8.9–10.3)
Chloride: 100 mmol/L — ABNORMAL LOW (ref 101–111)
Creatinine, Ser: 0.61 mg/dL (ref 0.44–1.00)
GFR calc Af Amer: >60 mL/min (ref >60)
GFR calc non Af Amer: >60 mL/min (ref >60)
Glucose, Bld: 108 mg/dL — ABNORMAL HIGH (ref 65–99)
Potassium: 3.2 mmol/L — ABNORMAL LOW (ref 3.5–5.1)
Sodium: 136 mmol/L (ref 135–145)

## 2016-09-21 LAB — URINALYSIS, ROUTINE W REFLEX MICROSCOPIC
Bilirubin Urine: NEGATIVE
Glucose, UA: NEGATIVE mg/dL
Ketones, ur: NEGATIVE mg/dL
Leukocytes, UA: NEGATIVE
Nitrite: NEGATIVE
Protein, ur: NEGATIVE mg/dL
Specific Gravity, Urine: 1.008 (ref 1.005–1.030)
WBC, UA: NONE SEEN WBC/hpf (ref 0–5)
pH: 7 (ref 5.0–8.0)

## 2016-09-21 LAB — CBC WITH DIFFERENTIAL/PLATELET
Basophils Absolute: 0 10*3/uL (ref 0.0–0.1)
Basophils Relative: 0 %
Eosinophils Absolute: 0.1 10*3/uL (ref 0.0–0.7)
Eosinophils Relative: 1 %
HCT: 36.3 % (ref 36.0–46.0)
Hemoglobin: 12.2 g/dL (ref 12.0–15.0)
Lymphocytes Relative: 19 %
Lymphs Abs: 1.4 10*3/uL (ref 0.7–4.0)
MCH: 29.5 pg (ref 26.0–34.0)
MCHC: 33.6 g/dL (ref 30.0–36.0)
MCV: 87.7 fL (ref 78.0–100.0)
Monocytes Absolute: 0.5 10*3/uL (ref 0.1–1.0)
Monocytes Relative: 7 %
Neutro Abs: 5.3 10*3/uL (ref 1.7–7.7)
Neutrophils Relative %: 73 %
Platelets: 203 10*3/uL (ref 150–400)
RBC: 4.14 MIL/uL (ref 3.87–5.11)
RDW: 13.2 % (ref 11.5–15.5)
WBC: 7.3 10*3/uL (ref 4.0–10.5)

## 2016-09-21 LAB — RAPID URINE DRUG SCREEN, HOSP PERFORMED
Amphetamines: NOT DETECTED
Barbiturates: NOT DETECTED
Benzodiazepines: NOT DETECTED
Cocaine: NOT DETECTED
Opiates: NOT DETECTED
Tetrahydrocannabinol: NOT DETECTED

## 2016-09-21 LAB — TROPONIN I: Troponin I: <0.03 ng/mL (ref <0.03)

## 2016-09-21 LAB — I-STAT BETA HCG BLOOD, ED (MC, WL, AP ONLY): I-stat hCG, quantitative: <5 m[IU]/mL (ref <5)

## 2016-09-21 LAB — D-DIMER, QUANTITATIVE: D-Dimer, Quant: <0.27 ug/mL-FEU (ref 0.00–0.50)

## 2016-09-21 LAB — ECHOCARDIOGRAM COMPLETE

## 2016-09-21 MED ORDER — SODIUM CHLORIDE 0.9% FLUSH
3.0000 mL | Freq: Two times a day (BID) | INTRAVENOUS | Status: DC
  Administered 2016-09-22: 3 mL via INTRAVENOUS

## 2016-09-21 MED ORDER — HYDROCODONE-ACETAMINOPHEN 5-325 MG PO TABS: 1.0000 | ORAL_TABLET | Freq: Two times a day (BID) | ORAL | Status: DC | PRN

## 2016-09-21 MED ORDER — POTASSIUM CHLORIDE CRYS ER 20 MEQ PO TBCR
40.0000 meq | EXTENDED_RELEASE_TABLET | ORAL | Status: AC
  Administered 2016-09-21 (×2): 40 meq via ORAL
  Filled 2016-09-21 (×2): qty 2

## 2016-09-21 MED ORDER — ONDANSETRON HCL 4 MG/2ML IJ SOLN: 4.0000 mg | Freq: Four times a day (QID) | INTRAMUSCULAR | Status: DC | PRN

## 2016-09-21 MED ORDER — ONDANSETRON HCL 4 MG PO TABS: 4.0000 mg | ORAL_TABLET | Freq: Four times a day (QID) | ORAL | Status: DC | PRN

## 2016-09-21 MED ORDER — ONDANSETRON HCL 4 MG/2ML IJ SOLN
4.0000 mg | Freq: Once | INTRAMUSCULAR | Status: AC
  Administered 2016-09-21: 4 mg via INTRAVENOUS
  Filled 2016-09-21: qty 2

## 2016-09-21 MED ORDER — ENOXAPARIN SODIUM 40 MG/0.4ML ~~LOC~~ SOLN
40.0000 mg | SUBCUTANEOUS | Status: DC
  Administered 2016-09-21: 40 mg via SUBCUTANEOUS
  Filled 2016-09-21: qty 0.4

## 2016-09-21 MED ORDER — ACETAMINOPHEN 500 MG PO TABS: 1000.0000 mg | ORAL_TABLET | Freq: Four times a day (QID) | ORAL | Status: DC | PRN

## 2016-09-21 MED ORDER — SODIUM CHLORIDE 0.9 % IV BOLUS (SEPSIS): 1000.0000 mL | Freq: Once | INTRAVENOUS | Status: AC

## 2016-09-21 MED ORDER — POTASSIUM CHLORIDE IN NACL 20-0.9 MEQ/L-% IV SOLN
INTRAVENOUS | Status: DC
  Administered 2016-09-21 – 2016-09-22 (×2): via INTRAVENOUS

## 2016-09-21 MED ORDER — PANTOPRAZOLE SODIUM 40 MG PO TBEC
40.0000 mg | DELAYED_RELEASE_TABLET | Freq: Two times a day (BID) | ORAL | Status: DC
  Administered 2016-09-22: 40 mg via ORAL
  Filled 2016-09-21 (×2): qty 1

## 2016-09-21 NOTE — ED Provider Notes (Signed)
[facility] DEPT Provider Note   CSN: [id number] Arrival date & time: 09/21/16  8242     History   Chief Complaint Chief Complaint  Patient presents with  . Dizziness  . Loss of Consciousness    HPI Brittany Durham is a 46 y.o. female.  The history is provided by the patient.  Dizziness  Quality:  Lightheadedness and head spinning Severity:  Severe Onset quality:  Sudden Duration:  1 day Timing:  Intermittent Progression:  Waxing and waning Chronicity:  New Context: loss of consciousness and standing up   Relieved by:  Being still Worsened by:  Standing up Ineffective treatments:  None tried Associated symptoms: palpitations and syncope   Associated symptoms: no blood in stool, no chest pain, no diarrhea, no headaches, no shortness of breath, no vision changes, no vomiting and no weakness   Risk factors: anemia   Risk factors: no new medications   Loss of Consciousness   Associated symptoms include dizziness and palpitations. Pertinent negatives include chest pain, congestion, fever, headaches, visual change, vomiting and weakness.    46 year old female who presents with dizziness. 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 She has a history of seizures, but currently not on antiepileptics.  States that she went about her normal self yesterday evening. At 1 AM woke up to use the restroom and noticed that she was very dizzy when she got up. She went back to sleep, and woke up again this morning with sensation of dizziness as if the room is spinning around her. It is associated with nausea, palpitations, and the sensation that she may pass out. EMS was called, and in route sure heart rate was 115 and she appeared "clammy. EMS didn't witness a syncopal episode about 2-3 minutes. She did not have tongue biting or urinary incontinence. She denies any  associating chest pain, difficulty breathing, new vision or speech changes, focal numbness or weakness. No recent history of dehydration, vomiting or diarrhea, fevers, melena or hematochezia, or heavy vaginal bleeding.   Past Medical History:  Diagnosis Date  . Hypertension   . Knee pain    left meniscus tear s/p repair 11/2014  . Seizures ([place])   . Stuttering    patient reports this started after her knee surgery in 11/2014.   Marland Kitchen Thyroid nodule     Patient Active Problem List   Diagnosis Date Noted  . Nausea with vomiting 07/18/2016  . PUD (peptic ulcer disease) 07/18/2016  . Slurred speech   . GI bleed 05/02/2016  . Upper GI bleed 05/02/2016  . Sinus tachycardia 05/02/2016  . Acute blood loss anemia 05/02/2016  . Abdominal pain, epigastric 05/02/2016  . 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 Thyroid nodule 12/14/2014  . Left knee pain 12/14/2014  . Seizures ([place])   . Altered mental status 12/12/2014  . Syncope 12/12/2014  . Vitamin D deficiency 06/30/2014  . GERD (gastroesophageal reflux disease) 06/29/2014  . SYNCOPE 03/02/2010  . HYPERTENSION, BENIGN 03/01/2010    Past Surgical History:  Procedure Laterality Date  . COLONOSCOPY N/A 08/23/2016   Procedure: COLONOSCOPY;  Surgeon: Daneil Dolin, MD;  [facility];  Service: Endoscopy;  Laterality: N/A;  1:00pm  . ESOPHAGOGASTRODUODENOSCOPY N/A 05/03/2016   Procedure: ESOPHAGOGASTRODUODENOSCOPY (EGD);  Surgeon: Daneil Dolin, MD;  [facility];  Service: Endoscopy;  Laterality: N/A;  . ESOPHAGOGASTRODUODENOSCOPY N/A 08/23/2016  Procedure: ESOPHAGOGASTRODUODENOSCOPY (EGD);  Surgeon: Daneil Dolin, MD;  [facility];  Service: Endoscopy;  Laterality: N/A;  . KNEE ARTHROSCOPY WITH MEDIAL MENISECTOMY Left 12/09/2014   Procedure: KNEE ARTHROSCOPY WITH PARTIAL MEDIAL AND LATERAL MENISECTOMY;  Surgeon: Sanjuana Kava, MD;  Location: [facility];  Service: Orthopedics;  Laterality: Left;  . TUBAL LIGATION    .  WISDOM TOOTH EXTRACTION      OB History    Gravida Para Term Preterm AB Living   1 1 1     1    SAB TAB Ectopic Multiple Live Births                   Home Medications    Prior to Admission medications   Medication Sig Start Date End Date Taking? Authorizing Provider  acetaminophen (TYLENOL) 500 MG tablet Take 1,000 mg by mouth every 6 (six) hours as needed for mild pain or moderate pain.     [provider]  amLODipine (NORVASC) 10 MG tablet TAKE 1 TABLET ONCE A DAY 09/14/16   Hawks, Colville A, FNP  HYDROcodone-acetaminophen (NORCO/VICODIN) 5-325 MG tablet Take 1 tablet by mouth every 4 (four) hours as needed. Patient taking differently: Take 1 tablet by mouth 2 (two) times daily as needed for moderate pain.  07/16/16   Evalee Jefferson, PA-C  lisinopril-hydrochlorothiazide (PRINZIDE,ZESTORETIC) 20-12.5 MG tablet TAKE 2 TABLETS ONCE A DAY 09/14/16   Evelina Dun A, FNP  pantoprazole (PROTONIX) 40 MG tablet Take 1 tablet (40 mg total) by mouth 2 (two) times daily before a meal. 07/18/16   Carlis Stable, NP  Potassium 95 MG TABS Take 99 mg by mouth daily.    [provider]  Vitamin D, Ergocalciferol, (DRISDOL) 50000 units CAPS capsule TAKE 1 CAPSULE ONCE A WEEK Patient taking differently: TAKE 1 CAPSULE ONCE A WEEK ON THURSDAYS 11/29/15   Sharion Balloon, FNP    Family History Family History  Problem Relation Age of Onset  . Hypertension Mother   . Hypertension Brother   . Colon cancer Neg Hx     Social History Social History  Substance Use Topics  . Smoking status: Never Smoker  . Smokeless tobacco: Never Used  . Alcohol use No     Allergies   Patient has no known allergies.   Review of Systems Review of Systems  Constitutional: Negative for fever.  HENT: Negative for congestion.   Respiratory: Positive for cough. Negative for shortness of breath.   Cardiovascular: Positive for palpitations and syncope. Negative for chest pain.  Gastrointestinal: Negative  for blood in stool, diarrhea and vomiting.  Genitourinary: Negative for vaginal bleeding.  Allergic/Immunologic: Negative for immunocompromised state.  Neurological: Positive for dizziness. Negative for weakness and headaches.  Hematological: Does not bruise/bleed easily.  All other systems reviewed and are negative.    Physical Exam Updated Vital Signs BP 129/84 (BP Location: Left Arm)   Pulse (!) 114   Temp 98.1 F (36.7 C)   Resp 18   LMP 08/27/2016 (Approximate)   SpO2 94%   Physical Exam Physical Exam  Nursing note and vitals reviewed. Constitutional: Non-toxic, appears very anxious and tearful Head: Normocephalic and atraumatic.  Mouth/Throat: Oropharynx is clear and moist.  Neck: Normal range of motion. Neck supple.  Cardiovascular: Tachycardic rate and regular rhythm.  no edema Pulmonary/Chest: Effort normal and breath sounds normal.  Abdominal: Soft. There is no tenderness. There is no rebound and no guarding.  Musculoskeletal: Normal range of motion. no calf  tenderness Neurological: Alert, no facial droop, stuttering speech (baseline), moves all extremities symmetrically, no dysmetria, full strength in bilateral upper and lower extremities, sensation to light touch in tact throughout Skin: Skin is warm and dry.  Psychiatric: Cooperative   ED Treatments / Results  Labs (all labs ordered are listed, but only abnormal results are displayed) Labs Reviewed  BASIC METABOLIC PANEL - Abnormal; Notable for the following:       Result Value   Potassium 3.2 (*)    Chloride 100 (*)    Glucose, Bld 108 (*)    All other components within normal limits  URINALYSIS, ROUTINE W REFLEX MICROSCOPIC - Abnormal; Notable for the following:    Color, Urine STRAW (*)    Hgb urine dipstick SMALL (*)    Bacteria, UA RARE (*)    Squamous Epithelial / LPF 0-5 (*)    All other components within normal limits  CBC WITH DIFFERENTIAL/PLATELET  TROPONIN I  D-DIMER, QUANTITATIVE (NOT AT  [facility])  RAPID URINE DRUG SCREEN, HOSP PERFORMED  I-STAT BETA HCG BLOOD, ED ([facility], [facility], [facility] ONLY)    EKG  EKG Interpretation None       Radiology Dg Chest Portable 1 View  Result Date: 09/21/2016 CLINICAL DATA:  Dizziness today EXAM: PORTABLE CHEST 1 VIEW COMPARISON:  07/15/2016 FINDINGS: Borderline cardiomegaly. Lungs clear. No pneumothorax. No pleural effusion. IMPRESSION: No active disease. Electronically Signed   By: Marybelle Killings M.D.   On: 09/21/2016 07:54    Procedures Procedures (including critical care time)  Medications Ordered in ED Medications  ondansetron (ZOFRAN) injection 4 mg (4 mg Intravenous Given 09/21/16 0710)  sodium chloride 0.9 % bolus 1,000 mL (1,000 mLs Intravenous Given by EMS 09/21/16 0725)     Initial Impression / Assessment and Plan / ED Course  I have reviewed the triage vital signs and the nursing notes.  Pertinent labs & imaging results that were available during my care of the patient were reviewed by me and considered in my medical decision making (see chart for details).      I reviewed the available records in [facility]. She has baseline tachycardia usually between 90-120s. She has been persistently tachycardic here, HR 110-150s. Has had recurrent episodes of LOC in the ED, while sitting in bed as well as after walking to the bathroom where she had witnessed episode. No seizure activity noted, no post ictal period. Orthostatics negative, and she felt more dizzy while lying flat. She is very emotional and tearful during all of this, and question if there may be psychological component as well. She had work-up for seizure and syncopal episodes of AMS in  2016 that was unremarkable, and this appears somewhat similar.  Her work-up is reassuring. EKG without stigmata of arrythmia, but with sinus tachycardia. Negative Ddimer and low risk for PE. No anemia. No other major electrolyte or metabolic derangements.   Given recurrent episodes, will plan observation  admission for ongoing monitoring and work-up.   Final Clinical Impressions(s) / ED Diagnoses   Final diagnoses:  LOC (loss of consciousness) [facility])    New Prescriptions New Prescriptions   No medications on file     Forde Dandy, MD 09/21/16 [phone]

## 2016-09-21 NOTE — ED Triage Notes (Signed)
Ems called out for dizziness. Had a surgery recently and has had trouble talking since. cbg 102 in route. Hr 150 in routre and was cold and clammy. Ems states pt had syncopal episode for about 2-3 minutes with ems. Pt arrived tearful.  A/o.

## 2016-09-21 NOTE — Progress Notes (Signed)
*  PRELIMINARY RESULTS* Echocardiogram 2D Echocardiogram has been performed.  Brittany Durham 09/21/2016, 3:24 PM

## 2016-09-21 NOTE — ED Notes (Signed)
Helped pt to restroom. Moved slowly from bed. States al ittle dizzy but became better quickly. Walked to bathroom fine. Walking out of bathroom and pt had syncopal episode lasting approx 1 min. edp at side. Pt did not fall to floor was caught by RN x 2. Pt regained consciousness and asked where she was. Pt reoriented. No seizure like activity noted. Pt back to bed and vs obtained. Pt a/o. Was able to help get self back in bed.

## 2016-09-21 NOTE — H&P (Signed)
TRH H&P   Patient Demographics:    Brittany Durham, is a 46 y.o. female  MRN: [id number]   DOB - 05/13/1971  Admit Date - 09/21/2016  Outpatient Primary MD for the patient is [name], FNP  Referring MD/NP/PA: Dr Verdie Mosher  Patient coming from: Home  Chief Complaint  Patient presents with  . Dizziness  . Loss of Consciousness      HPI:    Brittany Durham  is a 46 y.o. 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 - in ED blood work was significant for potassium 3.2, otherwise no significant labs abnormalities, chest x-ray with no acute finding, EKG significant for sinus tachycardia, otherwise no acute changes, normal urinalysis, I was called to admit.    Review of systems:    In addition to the HPI above, No Fever-chills,Reports dizziness No Headache, No changes with Vision or hearing, No problems swallowing food or Liquids, No Chest pain,Shortness of Breath,Some cough, palpitation and syncope No Abdominal pain, No Nausea or Vommitting, Bowel movements are  regular, No Blood in stool or Urine, No dysuria, No new skin rashes or bruises, No new joints pains-aches,  No new weakness, tingling, numbness in any extremity, No recent weight gain or loss, No polyuria, polydypsia or polyphagia, No significant Mental Stressors.  A full 10 point Review of Systems was done, except as stated above, all other Review of Systems were negative.   With Past History of the following :    Past Medical History:  Diagnosis Date  . Hypertension   . Knee pain    left meniscus tear s/p repair 11/2014  . Seizures (HCC)   . Stuttering    patient reports this started after her knee surgery in 11/2014.   Marland Kitchen Thyroid nodule       Past Surgical History:  Procedure Laterality Date  . COLONOSCOPY N/A 08/23/2016   Procedure: COLONOSCOPY;  Surgeon: Corbin Ade, MD;  [facility];  Service: Endoscopy;  Laterality: N/A;  1:00pm  . ESOPHAGOGASTRODUODENOSCOPY N/A 05/03/2016   Procedure: ESOPHAGOGASTRODUODENOSCOPY (EGD);  Surgeon: Corbin Ade, MD;  Location: [facility];  Service: Endoscopy;  Laterality: N/A;  . ESOPHAGOGASTRODUODENOSCOPY N/A 08/23/2016   Procedure: ESOPHAGOGASTRODUODENOSCOPY (EGD);  Surgeon: Corbin Ade, MD;  [facility];  Service: Endoscopy;  Laterality: N/A;  . KNEE ARTHROSCOPY WITH MEDIAL MENISECTOMY Left 12/09/2014   Procedure: KNEE ARTHROSCOPY WITH PARTIAL MEDIAL AND LATERAL MENISECTOMY;  Surgeon: Darreld Mclean, MD;  Location: [facility];  Service: Orthopedics;  Laterality: Left;  . TUBAL LIGATION    . WISDOM TOOTH EXTRACTION        Social History:     Social History  Substance Use Topics  . Smoking status: Never Smoker  . Smokeless tobacco: Never Used  . Alcohol use No     Lives - At home  Mobility - independent     Family History :     Family History  Problem Relation Age of Onset  . Hypertension Mother   . Hypertension Brother   . Colon cancer Neg Hx       Home Medications:   Prior to  Admission medications   Medication Sig Start Date End Date Taking? Authorizing Provider  acetaminophen (TYLENOL) 500 MG tablet Take 1,000 mg by mouth every 6 (six) hours as needed for mild pain or moderate pain.    Yes [provider]  amLODipine (NORVASC) 10 MG tablet TAKE 1 TABLET ONCE A DAY 09/14/16  Yes Hawks, Christy A, FNP  lisinopril-hydrochlorothiazide (PRINZIDE,ZESTORETIC) 20-12.5 MG tablet TAKE 2 TABLETS ONCE A DAY 09/14/16  Yes Hawks, Christy A, FNP  loratadine (CLARITIN) 10 MG tablet Take 10 mg by mouth daily as needed for allergies.   Yes [provider]  pantoprazole (PROTONIX) 40 MG tablet Take 1 tablet (40 mg total) by mouth 2 (two) times daily before a meal. 07/18/16  Yes Anice Paganini, NP  Potassium 95 MG TABS Take 99 mg by mouth daily.   Yes [provider]  Vitamin D, Ergocalciferol, (DRISDOL) 50000 units CAPS capsule TAKE 1 CAPSULE ONCE A WEEK Patient taking differently: TAKE 1 CAPSULE ONCE A WEEK ON THURSDAYS 11/29/15  Yes Hawks, Christy A, FNP     Allergies:    No Known Allergies   Physical Exam:   Vitals  Blood pressure (!) 151/88, pulse (!) 110, temperature 98.1 F (36.7 C), resp. rate 16, last menstrual period 08/27/2016, SpO2 96 %.   1. General Well-developed female lying in bed in NAD,    2. Normal affect and insight, Not Suicidal or Homicidal, Awake Alert, Oriented X 3.  3. No F.N deficits, ALL C.Nerves Intact, stuttering speech (at baseline )Strength 5/5 all 4 extremities, Sensation intact all 4 extremities, Plantars down going.  4. Ears and Eyes appear Normal, Conjunctivae clear, PERRLA. Moist Oral Mucosa.  5. Supple Neck, No JVD, No cervical lymphadenopathy appriciated, No Carotid Bruits.  6. Symmetrical Chest wall movement, Good air movement bilaterally, CTAB.  7. Tachycardic, No Gallops, Rubs or Murmurs, No Parasternal Heave.  8. Positive Bowel Sounds, Abdomen Soft, No tenderness, No organomegaly appriciated,No rebound  -guarding or rigidity.  9.  No Cyanosis, Normal Skin Turgor, No Skin Rash or Bruise.  10. Good muscle tone,  joints appear normal , no effusions, Normal ROM.  11. No Palpable Lymph Nodes in Neck or Axillae     Data Review:    CBC  Recent Labs Lab 09/17/16 1050 09/21/16 0735  WBC 5.8 7.3  HGB 11.4* 12.2  HCT 35.4 36.3  PLT 230 203  MCV 88.7 87.7  MCH 28.6 29.5  MCHC  32.2 33.6  RDW 13.7 13.2  LYMPHSABS 1,914 1.4  MONOABS 348 0.5  EOSABS 58 0.1  BASOSABS 0 0.0   ------------------------------------------------------------------------------------------------------------------  Chemistries   Recent Labs Lab 09/21/16 0735  NA 136  K 3.2*  CL 100*  CO2 29  GLUCOSE 108*  BUN 13  CREATININE 0.61  CALCIUM 9.1   ------------------------------------------------------------------------------------------------------------------ estimated creatinine clearance is 105.9 mL/min (by C-G formula based on SCr of 0.61 mg/dL). ------------------------------------------------------------------------------------------------------------------ No results for input(s): TSH, T4TOTAL, T3FREE, THYROIDAB in the last 72 hours.  Invalid input(s): FREET3  Coagulation profile No results for input(s): INR, PROTIME in the last 168 hours. -------------------------------------------------------------------------------------------------------------------  Recent Labs  09/21/16 0735  DDIMER <0.27   -------------------------------------------------------------------------------------------------------------------  Cardiac Enzymes  Recent Labs Lab 09/21/16 0735  TROPONINI <0.03   ------------------------------------------------------------------------------------------------------------------ No results found for: BNP   ---------------------------------------------------------------------------------------------------------------  Urinalysis    Component Value Date/Time    COLORURINE STRAW (A) 09/21/2016 0800   APPEARANCEUR CLEAR 09/21/2016 0800   LABSPEC 1.008 09/21/2016 0800   PHURINE 7.0 09/21/2016 0800   GLUCOSEU NEGATIVE 09/21/2016 0800   HGBUR SMALL (A) 09/21/2016 0800   BILIRUBINUR NEGATIVE 09/21/2016 0800   KETONESUR NEGATIVE 09/21/2016 0800   PROTEINUR NEGATIVE 09/21/2016 0800   UROBILINOGEN 0.2 12/12/2014 2127   NITRITE NEGATIVE 09/21/2016 0800   LEUKOCYTESUR NEGATIVE 09/21/2016 0800    ----------------------------------------------------------------------------------------------------------------   Imaging Results:    Dg Chest Portable 1 View  Result Date: 09/21/2016 CLINICAL DATA:  Dizziness today EXAM: PORTABLE CHEST 1 VIEW COMPARISON:  07/15/2016 FINDINGS: Borderline cardiomegaly. Lungs clear. No pneumothorax. No pleural effusion. IMPRESSION: No active disease. Electronically Signed   By: Jolaine Click M.D.   On: 09/21/2016 07:54    My personal review of EKG: Sinus tachycardia, no arrhythmia , no Acute ST changes   Assessment & Plan:    Active Problems:   HYPERTENSION, BENIGN   GERD (gastroesophageal reflux disease)   Syncope   Sinus tachycardia   Slurred speech   Syncope - Presents with recurrent episodes of syncope, no significant events on telemetry during these episodes beside persistent sinus tachycardia, she denies any chest pain, negative D-dimer test, so unlikely related to PE, she is not orthostatic, she will be admitted to telemetry, will obtain 2-D echo, she is not orthostatic, but I will continue to hold her antihypertensive medication, continue with gentle hydration.  Sinus tachycardia - Seems to be chronic, averaging heart rate in the low 100s on previous visits, will continue with gentle hydration and observe.  GERD - Continue with PPI  Hypertension - Blood pressure acceptable, continue to hold meds in the setting of syncope  Slurred speech - Chronic problem, at baseline per patient and daughter, with no acute  changes.  Hypokalemia - Repleted  DVT Prophylaxis  Lovenox - SCDs  AM Labs Ordered, also please review Full Orders  Family Communication: Admission, patients condition and plan of care including tests being ordered have been discussed with the patient and daughter who indicate understanding and agree with the plan and Code Status.  Code Status full  Likely DC to Home  Condition GUARDED    Consults called: None  Admission status: Observation  Time spent in minutes : 55 minutes   Margee Trentham M.D on 09/21/2016 at 10:48 AM  Between 7am to 7pm - Pager - [phone]. After 7pm go to [email or web address] - password [facility]  [facility] - Office  [phone]

## 2016-09-22 DIAGNOSIS — R4781 Slurred speech: Secondary | ICD-10-CM | POA: Diagnosis not present

## 2016-09-22 DIAGNOSIS — R000 Tachycardia, unspecified: Secondary | ICD-10-CM | POA: Diagnosis not present

## 2016-09-22 DIAGNOSIS — I10 Essential (primary) hypertension: Secondary | ICD-10-CM | POA: Diagnosis not present

## 2016-09-22 DIAGNOSIS — R55 Syncope and collapse: Secondary | ICD-10-CM | POA: Diagnosis not present

## 2016-09-22 LAB — BASIC METABOLIC PANEL
Anion gap: 8 (ref 5–15)
BUN: 10 mg/dL (ref 6–20)
CO2: 27 mmol/L (ref 22–32)
Calcium: 9.4 mg/dL (ref 8.9–10.3)
Chloride: 102 mmol/L (ref 101–111)
Creatinine, Ser: 0.64 mg/dL (ref 0.44–1.00)
GFR calc Af Amer: >60 mL/min (ref >60)
GFR calc non Af Amer: >60 mL/min (ref >60)
Glucose, Bld: 102 mg/dL — ABNORMAL HIGH (ref 65–99)
Potassium: 3.8 mmol/L (ref 3.5–5.1)
Sodium: 137 mmol/L (ref 135–145)

## 2016-09-22 LAB — CBC
HCT: 37.4 % (ref 36.0–46.0)
Hemoglobin: 12.3 g/dL (ref 12.0–15.0)
MCH: 29.3 pg (ref 26.0–34.0)
MCHC: 32.9 g/dL (ref 30.0–36.0)
MCV: 89 fL (ref 78.0–100.0)
Platelets: 234 10*3/uL (ref 150–400)
RBC: 4.2 MIL/uL (ref 3.87–5.11)
RDW: 13.4 % (ref 11.5–15.5)
WBC: 6.3 10*3/uL (ref 4.0–10.5)

## 2016-09-22 LAB — HIV ANTIBODY (ROUTINE TESTING W REFLEX): HIV Screen 4th Generation wRfx: NONREACTIVE

## 2016-09-22 MED ORDER — LISINOPRIL 20 MG PO TABS: 20.0000 mg | ORAL_TABLET | Freq: Every day | ORAL | 0 refills | Status: DC

## 2016-09-22 MED ORDER — AMLODIPINE BESYLATE 5 MG PO TABS: 5.0000 mg | ORAL_TABLET | Freq: Every day | ORAL | 0 refills | Status: DC

## 2016-09-22 NOTE — Discharge Summary (Signed)
Brittany Durham, is a 46 y.o. female  DOB 1971/02/04  MRN [id number].  Admission date:  09/21/2016  Admitting Physician  Starleen Arms, MD  Discharge Date:  09/22/2016   Primary MD  [name], FNP  Recommendations for primary care physician for things to follow:  - please check CBC, BMP during next visit, adjust antihypertensive medication as needed.    Admission Diagnosis  LOC (loss of consciousness) (HCC) [R40.20]   Discharge Diagnosis  LOC (loss of consciousness) (HCC) [R40.20]    Active Problems:   HYPERTENSION, BENIGN   GERD (gastroesophageal reflux disease)   Syncope   Sinus tachycardia   Slurred speech      Past Medical History:  Diagnosis Date  . Hypertension   . Knee pain    left meniscus tear s/p repair 11/2014  . Seizures (HCC)   . Stuttering    patient reports this started after her knee surgery in 11/2014.   Marland Kitchen Thyroid nodule     Past Surgical History:  Procedure Laterality Date  . COLONOSCOPY N/A 08/23/2016   Procedure: COLONOSCOPY;  Surgeon: Corbin Ade, MD;  [facility];  Service: Endoscopy;  Laterality: N/A;  1:00pm  . ESOPHAGOGASTRODUODENOSCOPY N/A 05/03/2016   Procedure: ESOPHAGOGASTRODUODENOSCOPY (EGD);  Surgeon: Corbin Ade, MD;  [facility];  Service: Endoscopy;  Laterality: N/A;  . ESOPHAGOGASTRODUODENOSCOPY N/A 08/23/2016   Procedure: ESOPHAGOGASTRODUODENOSCOPY (EGD);  Surgeon: Corbin Ade, MD;  [facility];  Service: Endoscopy;  Laterality: N/A;  . KNEE ARTHROSCOPY WITH MEDIAL MENISECTOMY Left 12/09/2014   Procedure: KNEE ARTHROSCOPY WITH PARTIAL MEDIAL AND LATERAL MENISECTOMY;  Surgeon: Darreld Mclean, MD;  Location: [facility];  Service: Orthopedics;  Laterality: Left;  . TUBAL LIGATION    . WISDOM TOOTH EXTRACTION         History of present illness and  Hospital Course:     Kindly see H&P for history of  present illness and admission details, please review complete Labs, Consult reports and Test reports for all details in brief  HPI  from the history and physical done on the day of admission 09/21/2016  Brittany Durham  is a 46 y.o. 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 -  in ED blood work was significant for potassium 3.2, otherwise no significant labs abnormalities, chest x-ray with no acute finding, EKG significant for sinus tachycardia, otherwise no acute changes, normal urinalysis, I was called to admit.  Hospital Course    Syncope - Presents with recurrent episodes of syncope, he had couple episodes of 2-3 seconds syncope while in ED, she was on telemetry monitor, no evidence of heart block, bradycardia during this episodes, was admitted to was admitted for further workup, kept on telemetry overnight, no significant events of arrhythmias, heart blocks, pauses or bradycardia, only significant for sinus tachycardia with heart rate in high 90s  , she was not orthostatic , her antihypertensive medication has been held as well , blood pressure remained stable during hospital stay ,  to the diameters has been obtained, preserved EF, no regional wall motion abnormalities,  she denies any chest pain, negative D-dimer test.  Sinus tachycardia - Seems to be chronic, averaging heart rate in the low 100s on previous visits, she was monitored on telemetry overnight, no significant arrhythmias  GERD - Continue with PPI  Hypertension - Blood pressure acceptable during hospital stay off medication, so I did stop her diuretics hydrochlorothiazide, I will decrease her amlodipine dose by half, and I will will continue her home dose lisinopril.  Slurred speech - Chronic problem, at baseline per patient and daughter, with no acute changes.  Hypokalemia - Repleted, potassium 3.8 on discharge   Discharge Condition:  stable  Follow UP  Follow-up Information    Jannifer Rodney A, FNP Follow up in 1 week(s).   Specialty:  Family Medicine Contact information: [address] [place] [address] [phone]             Discharge Instructions  and  Discharge Medications     Discharge Instructions    Discharge instructions    Complete by:  As directed    Follow with Primary MD [name], FNP in 7 days   Get CBC, CMP,  checked  by Primary MD next visit.    Activity: As tolerated with Full fall precautions use walker/cane & assistance as needed   Disposition Home    Diet: Heart Healthy  , with feeding assistance and aspiration precautions.   On your next visit with your primary care physician please Get Medicines reviewed and adjusted.   Please request your Prim.MD to go over all Hospital Tests and Procedure/Radiological results at the follow up, please get all Hospital records sent to your Prim MD by signing hospital release before you go home.   If you experience worsening of your admission symptoms, develop shortness of breath, life threatening emergency, suicidal or homicidal thoughts you must seek medical attention immediately by  calling 911 or calling your MD immediately  if symptoms less severe.  You Must read complete instructions/literature along with all the possible adverse reactions/side effects for all the Medicines you take and that have been prescribed to you. Take any new Medicines after you have completely understood and accpet all the possible adverse reactions/side effects.   Do not drive, operating heavy machinery, perform activities at heights, swimming or participation in water activities or provide baby sitting services if your were admitted for syncope or siezures until you have seen by Primary MD or a Neurologist and advised to do so again.  Do not drive when taking Pain medications.    Do not take more than prescribed Pain, Sleep and Anxiety Medications  Special Instructions: If you have  smoked or chewed Tobacco  in the last 2 yrs please stop smoking, stop any regular Alcohol  and or any Recreational drug use.  Wear Seat belts while driving.   Please note  You were cared for by a hospitalist during your hospital stay. If you have any questions about your discharge medications or the care you received while you were in the hospital after you are discharged, you can call the unit and asked to speak with the hospitalist on call if the hospitalist that took care of you is not available. Once you are discharged, your primary care physician will handle any further medical issues. Please note that NO REFILLS for any discharge medications will be authorized once you are discharged, as it is imperative that you return to your primary care physician (or establish a relationship with a primary care physician if you do not have one) for your aftercare needs so that they can reassess your need for medications and monitor your lab values.   Increase activity slowly    Complete by:  As directed      Allergies as of 09/22/2016   No Known Allergies     Medication List    STOP taking these medications     lisinopril-hydrochlorothiazide 20-12.5 MG tablet Commonly known as:  PRINZIDE,ZESTORETIC     TAKE these medications   acetaminophen 500 MG tablet Commonly known as:  TYLENOL Take 1,000 mg by mouth every 6 (six) hours as needed for mild pain or moderate pain.   amLODipine 5 MG tablet Commonly known as:  NORVASC Take 1 tablet (5 mg total) by mouth daily. What changed:  medication strength  See the new instructions.   lisinopril 20 MG tablet Commonly known as:  PRINIVIL,ZESTRIL Take 1 tablet (20 mg total) by mouth daily.   loratadine 10 MG tablet Commonly known as:  CLARITIN Take 10 mg by mouth daily as needed for allergies.   pantoprazole 40 MG tablet Commonly known as:  PROTONIX Take 1 tablet (40 mg total) by mouth 2 (two) times daily before a meal.   Potassium 95 MG Tabs Take 99 mg by mouth daily.   Vitamin D (Ergocalciferol) 50000 units Caps capsule Commonly known as:  DRISDOL TAKE 1 CAPSULE ONCE A WEEK What changed:  See the new instructions.         Diet and Activity recommendation: See Discharge Instructions above   Consults obtained -  none   Major procedures and Radiology Reports - PLEASE review detailed and final reports for all details, in brief -      Dg Chest Portable 1 View  Result Date: 09/21/2016 CLINICAL DATA:  Dizziness today EXAM: PORTABLE CHEST 1 VIEW COMPARISON:  07/15/2016 FINDINGS: Borderline cardiomegaly. Lungs clear. No pneumothorax. No pleural effusion. IMPRESSION: No active disease. Electronically Signed   By: Jolaine Click M.D.   On: 09/21/2016 07:54    Micro Results     No results found for this or any previous visit (from the past 240 hour(s)).     Today   Subjective:   [name] today has no headache,no chest or abdominal pain,no new weakness tingling or numbness, feels much better wants to go home today.  Objective:   Blood pressure 138/80, pulse 99, temperature 98.6 F (37 C), temperature source Oral,  resp. rate 15, last menstrual period 08/27/2016, SpO2 99 %.   Intake/Output Summary (Last 24 hours) at 09/22/16 1015 Last data filed at 09/22/16 0547  Gross per 24 hour  Intake  1632.5 ml  Output              250 ml  Net           1382.5 ml    Exam Awake Alert, Oriented x 3, No new F.N deficits(stuttering at baseline), Normal affect Supple Neck,No JVD,.  Symmetrical Chest wall movement, Good air movement bilaterally, CTAB RRR,No Gallops,Rubs or new Murmurs, No Parasternal Heave +ve B.Sounds, Abd Soft, Non tender,No rebound -guarding or rigidity. No Cyanosis, Clubbing or edema, No new Rash or bruise  Data Review   CBC w Diff: Lab Results  Component Value Date   WBC 6.3 09/22/2016   HGB 12.3 09/22/2016   HCT 37.4 09/22/2016   PLT 234 09/22/2016   LYMPHOPCT 19 09/21/2016   MONOPCT 7 09/21/2016   EOSPCT 1 09/21/2016   BASOPCT 0 09/21/2016    CMP: Lab Results  Component Value Date   NA 137 09/22/2016   NA 142 12/31/2014   K 3.8 09/22/2016   CL 102 09/22/2016   CO2 27 09/22/2016   BUN 10 09/22/2016   BUN 10 12/31/2014   CREATININE 0.64 09/22/2016   PROT 7.8 07/15/2016   PROT 7.6 12/31/2014   ALBUMIN 4.1 07/15/2016   ALBUMIN 4.7 12/31/2014   BILITOT 0.4 07/15/2016   BILITOT 0.4 12/31/2014   ALKPHOS 62 07/15/2016   AST 28 07/15/2016   ALT 26 07/15/2016  .   Total Time in preparing paper work, data evaluation and todays exam - 35 minutes  Traquan Duarte M.D on 09/22/2016 at 10:15 AM  [facility]   Office  [phone]

## 2016-09-22 NOTE — Discharge Instructions (Signed)
Follow with Primary MD Hawks, Christy A, FNP in 7 days  ° °Get CBC, CMP,  checked  by Primary MD next visit.  ° ° °Activity: As tolerated with Full fall precautions use walker/cane & assistance as needed ° ° °Disposition Home  ° ° °Diet: Heart Healthy, with feeding assistance and aspiration precautions. ° °On your next visit with your primary care physician please Get Medicines reviewed and adjusted. ° ° °Please request your Prim.MD to go over all Hospital Tests and Procedure/Radiological results at the follow up, please get all Hospital records sent to your Prim MD by signing hospital release before you go home. ° ° °If you experience worsening of your admission symptoms, develop shortness of breath, life threatening emergency, suicidal or homicidal thoughts you must seek medical attention immediately by calling 911 or calling your MD immediately  if symptoms less severe. ° °You Must read complete instructions/literature along with all the possible adverse reactions/side effects for all the Medicines you take and that have been prescribed to you. Take any new Medicines after you have completely understood and accpet all the possible adverse reactions/side effects.  ° °Do not drive, operating heavy machinery, perform activities at heights, swimming or participation in water activities or provide baby sitting services if your were admitted for syncope or siezures until you have seen by Primary MD or a Neurologist and advised to do so again. ° °Do not drive when taking Pain medications.  ° ° °Do not take more than prescribed Pain, Sleep and Anxiety Medications ° °Special Instructions: If you have smoked or chewed Tobacco  in the last 2 yrs please stop smoking, stop any regular Alcohol  and or any Recreational drug use. ° °Wear Seat belts while driving. ° ° °Please note ° °You were cared for by a hospitalist during your hospital stay. If you have any questions about your discharge medications or the care you received  while you were in the hospital after you are discharged, you can call the unit and asked to speak with the hospitalist on call if the hospitalist that took care of you is not available. Once you are discharged, your primary care physician will handle any further medical issues. Please note that NO REFILLS for any discharge medications will be authorized once you are discharged, as it is imperative that you return to your primary care physician (or establish a relationship with a primary care physician if you do not have one) for your aftercare needs so that they can reassess your need for medications and monitor your lab values. ° °

## 2016-09-25 ENCOUNTER — Ambulatory Visit (INDEPENDENT_AMBULATORY_CARE_PROVIDER_SITE_OTHER): Payer: Medicaid Other | Admitting: Neurology

## 2016-09-25 ENCOUNTER — Ambulatory Visit: Payer: Medicaid Other | Admitting: Neurology

## 2016-09-25 ENCOUNTER — Encounter: Payer: Self-pay | Admitting: Neurology

## 2016-09-25 DIAGNOSIS — F809 Developmental disorder of speech and language, unspecified: Secondary | ICD-10-CM | POA: Diagnosis not present

## 2016-09-25 NOTE — Progress Notes (Signed)
PATIENTLindora Durham DOB: [birth year]  Chief Complaint  Patient presents with  . Language Impairment    She is here with her daughter, Brittany Durham, to discuss her continued problems with a stuttering speech.  . Passing Out Event    She went to the hospital on 09/21/16 after feeling dizzy and passing out.  States her BP medication was adjusted while there and since her discharge, she has not had any further events.      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, she had left knee arthroscopic surgery under general anesthesia in December 09 2014 at [facility], initially she recovered very well  While changing her left knee wrap at home in July 30 first 2016, she complained of pain, started hyperventilation, had transient loss of consciousness, EMS was called, no seizure-like activity, she was noted to have difficulty talking, stuttering of the speech, he was taken to the emergency room, had extensive evaluations,  I have reviewed the record, EEG  and echocardiogram was normal in August 2016  I personally reviewed MRI/MRA of the brain that was normal, laboratory showed normal CMP, CBC, UDS was positive for opiates  Her speech overall has much improved, but not back to her baseline yet, she denies visual change, no arm weakness,   UPDATE Mar 31 2015: She continue have stuttering of the speech, was not evaluated by ENT, she complains of left knee pain, her left leg gave out, fell sometimes. seen in refer by    Brittany Durham is a 46 y.o. female    Sudden onset stuttering of the speech, with normal MRI of the brain, MRA of the brain, normal EEG, Not sure exactly etiology, likely functional overlay Patient desires ENT evaluations, referred. She will only return for  new issue.  Update Sep 25 2016:  I was able to review most recent hospital admission and discharge on May twelfth 2018, she was noted for passing out episode, she reported dizziness overnight, passing out at home which prompted her daughter to call EMS, patient appeared clammy, she had loss of consciousness for 2-3 minutes, was noted to have elevated heart rate 115, there was also noted transient loss of consciousness during her ED stay, telemetry monitoring was significant only for sinus tachycardia, there was no arrhythmia noted, laboratory showed low potassium 3.2, otherwise no significant abnormality, EKG showed sinus tachycardia, with heart rate of 122,  Echocardiogram on Sep 21 2016 showed ejection fraction 60-65%, wall motion were normal.  Laboratory showed hemoglobin of 12 point 3, UDS was negative Negative HIV, d-dimer, troponin,  She was discharged home with lower dose of blood pressure medication, amlodipine was decreased from 10 to 5 mg, lisinopril from 40 to 20 mg,  I personally reviewed MRI of the brain December 2017 that was normal.  She is now on disability, no longer work, she still has speech difficulty,  There was no significant change,  REVIEW OF SYSTEMS: Full 14 system review of systems performed and notable only for frequent urination, dizziness, passing out, speech difficulty, snoring,  ALLERGIES: No Known Allergies  HOME MEDICATIONS: Current Outpatient Prescriptions  Medication Sig Dispense Refill  . acetaminophen (TYLENOL) 500 MG tablet Take 1,000 mg by mouth every 6 (six) hours as needed for mild pain or moderate  pain.     . amLODipine (NORVASC) 5 MG tablet Take 1 tablet (5 mg total) by mouth daily. 30 tablet 0  . lisinopril (PRINIVIL,ZESTRIL) 20 MG tablet Take 1 tablet (20 mg total) by mouth daily. 30 tablet 0  . loratadine (CLARITIN) 10 MG tablet Take 10 mg by mouth daily as needed for allergies.    . pantoprazole (PROTONIX) 40 MG tablet Take 1 tablet (40 mg  total) by mouth 2 (two) times daily before a meal. 60 tablet 2  . Potassium 95 MG TABS Take 99 mg by mouth daily.    . Vitamin D, Ergocalciferol, (DRISDOL) 50000 units CAPS capsule TAKE 1 CAPSULE ONCE A WEEK (Patient taking differently: TAKE 1 CAPSULE ONCE A WEEK ON THURSDAYS) 12 capsule 3   No current facility-administered medications for this visit.     PAST MEDICAL HISTORY: Past Medical History:  Diagnosis Date  . Hypertension   . Knee pain    left meniscus tear s/p repair 11/2014  . Seizures ([place])   . Stuttering    patient reports this started after her knee surgery in 11/2014.   Marland Kitchen Thyroid nodule     PAST SURGICAL HISTORY: Past Surgical History:  Procedure Laterality Date  . COLONOSCOPY N/A 08/23/2016   Procedure: COLONOSCOPY;  Surgeon: Brittany Dolin, MD;  [facility];  Service: Endoscopy;  Laterality: N/A;  1:00pm  . ESOPHAGOGASTRODUODENOSCOPY N/A 05/03/2016   Procedure: ESOPHAGOGASTRODUODENOSCOPY (EGD);  Surgeon: Brittany Dolin, MD;  [facility];  Service: Endoscopy;  Laterality: N/A;  . ESOPHAGOGASTRODUODENOSCOPY N/A 08/23/2016   Procedure: ESOPHAGOGASTRODUODENOSCOPY (EGD);  Surgeon: Brittany Dolin, MD;  [facility];  Service: Endoscopy;  Laterality: N/A;  . KNEE ARTHROSCOPY WITH MEDIAL MENISECTOMY Left 12/09/2014   Procedure: KNEE ARTHROSCOPY WITH PARTIAL MEDIAL AND LATERAL MENISECTOMY;  Surgeon: Brittany Kava, MD;  Location: [facility];  Service: Orthopedics;  Laterality: Left;  . TUBAL LIGATION    . WISDOM TOOTH EXTRACTION      FAMILY HISTORY: Family History  Problem Relation Age of Onset  . Hypertension Mother   . Hypertension Brother   . Colon cancer Neg Hx     SOCIAL HISTORY:  Social History   Social History  . Marital status: Single    Spouse name: N/A  . Number of children: 1  . Years of education: 38   Occupational History  . Umemployed    Social History Main Topics  . Smoking status: Never Smoker  . Smokeless  tobacco: Never Used  . Alcohol use No  . Drug use: No  . Sexual activity: Yes    Birth control/ protection: Surgical   Other Topics Concern  . Not on file   Social History Narrative   Lives at home alone.   Right-handed.   Occasional caffeine use.     PHYSICAL EXAM   Vitals:   09/25/16 1339  BP: (!) 145/86  Pulse: (!) 102  Weight: 223 lb (101.2 kg)  Height: 5\' 7"  (1.702 m)    Not recorded      Body mass index is 34.93 kg/m.  PHYSICAL EXAMNIATION:  Gen: NAD, conversant, well nourised, obese, well groomed                     Cardiovascular: Regular rate rhythm, no peripheral edema, warm, nontender. Eyes: Conjunctivae clear without exudates or hemorrhage Neck: Supple, no carotid bruits. Pulmonary: Clear to auscultation bilaterally   NEUROLOGICAL EXAM:  MENTAL STATUS: Speech:  Stuttering  of speech, normal comprehension, follow commands Cognition:     Orientation to time, place and person     Normal recent and remote memory     Normal Attention span and concentration     Normal Language, naming, repeating,spontaneous speech     Fund of knowledge   CRANIAL NERVES: CN II: Visual fields are full to confrontation. Fundoscopic exam is normal with sharp discs and no vascular changes. Pupils are round equal and briskly reactive to light. CN III, IV, VI: extraocular movement are normal. No ptosis. CN V: Facial sensation is intact to pinprick in all 3 divisions bilaterally. Corneal responses are intact.  CN VII: Face is symmetric with normal eye closure and smile. CN VIII: Hearing is normal to rubbing fingers CN IX, X: Palate elevates symmetrically. Phonation is normal. CN XI: Head turning and shoulder shrug are intact CN XII: Tongue is midline with normal movements and no atrophy.  MOTOR: There is no pronator drift of out-stretched arms. Muscle bulk and tone are normal. Muscle strength is normal.  REFLEXES: Reflexes are 2+ and symmetric at the biceps, triceps,  knees, and ankles. Plantar responses are flexor.  SENSORY: Intact to light touch, pinprick, positional sensation and vibratory sensation are intact in fingers and toes.  COORDINATION: Rapid alternating movements and fine finger movements are intact. There is no dysmetria on finger-to-nose and heel-knee-shin.    GAIT/STANCE: Posture is normal. Gait is steady with normal steps, base, arm swing, and turning. Heel and toe walking are normal. Tandem gait is normal.  Romberg is absent.   DIAGNOSTIC DATA (LABS, IMAGING, TESTING) - I reviewed patient records, labs, notes, testing and imaging myself where available.   ASSESSMENT AND PLAN  Nel Stoneking is a 46 y.o. female   Stuttering of the speech   no etiology found,  We will refer her to speech pathologist  EEG  Follow-up clinic in 3 months, if there was no significant change, may discharge her from our clinic Syncope  I have suggested her keep well hydration,  Frequent monitoring of her blood pressure  Marcial Pacas, M.D. Ph.D.  [facility] [address], [place], [place] [address] Ph: [phone] Fax: [phone]  CC: Referring Provider

## 2016-10-09 ENCOUNTER — Other Ambulatory Visit: Payer: Medicaid Other

## 2016-10-11 ENCOUNTER — Encounter: Payer: Self-pay | Admitting: Neurology

## 2016-10-15 ENCOUNTER — Other Ambulatory Visit: Payer: Self-pay | Admitting: Family

## 2016-10-19 ENCOUNTER — Ambulatory Visit: Payer: Medicaid Other | Attending: Neurology

## 2016-10-29 ENCOUNTER — Ambulatory Visit (INDEPENDENT_AMBULATORY_CARE_PROVIDER_SITE_OTHER): Payer: Medicaid Other | Admitting: Family

## 2016-10-29 ENCOUNTER — Encounter: Payer: Self-pay | Admitting: Family

## 2016-10-29 VITALS — BP 139/89 | HR 110 | Temp 97.6°F | Ht 67.0 in | Wt 224.0 lb

## 2016-10-29 DIAGNOSIS — E559 Vitamin D deficiency, unspecified: Secondary | ICD-10-CM

## 2016-10-29 DIAGNOSIS — R479 Unspecified speech disturbances: Secondary | ICD-10-CM | POA: Diagnosis not present

## 2016-10-29 DIAGNOSIS — K219 Gastro-esophageal reflux disease without esophagitis: Secondary | ICD-10-CM | POA: Diagnosis not present

## 2016-10-29 DIAGNOSIS — I10 Essential (primary) hypertension: Secondary | ICD-10-CM | POA: Diagnosis not present

## 2016-10-29 DIAGNOSIS — F809 Developmental disorder of speech and language, unspecified: Secondary | ICD-10-CM | POA: Diagnosis not present

## 2016-10-29 NOTE — Patient Instructions (Signed)

## 2016-10-29 NOTE — Progress Notes (Signed)
   Subjective:    Patient ID: Brittany Durham, female    DOB: [birth year], 46 y.o.   MRN: [id number]  PT presents to the office today for chronic follow up. PT is followed by Neurology for stuttering of speech. Pt was referred to Speech Pathologists, but has not followed up yet.  Hypertension  This is a chronic problem. The current episode started more than 1 year ago. The problem has been resolved since onset. The problem is controlled. Associated symptoms include malaise/fatigue. Pertinent negatives include no headaches, peripheral edema or shortness of breath. Risk factors for coronary artery disease include sedentary lifestyle and smoking/tobacco exposure. The current treatment provides moderate improvement. There is no history of kidney disease, CAD/MI or heart failure.  Gastroesophageal Reflux  She reports no belching, no coughing or no heartburn. This is a chronic problem. The current episode started more than 1 year ago. The problem occurs occasionally. The problem has been waxing and waning. The symptoms are aggravated by certain foods. She has tried a PPI for the symptoms. The treatment provided moderate relief.      Review of Systems  Constitutional: Positive for malaise/fatigue.  Respiratory: Negative for cough and shortness of breath.   Gastrointestinal: Negative for heartburn.  Neurological: Negative for headaches.  All other systems reviewed and are negative.      Objective:   Physical Exam  Constitutional: She is oriented to person, place, and time. She appears well-developed and well-nourished. No distress.  HENT:  Head: Normocephalic and atraumatic.  Right Ear: External ear normal.  Left Ear: External ear normal.  Nose: Nose normal.  Mouth/Throat: Oropharynx is clear and moist.  Eyes: Pupils are equal, round, and reactive to light.  Neck: Normal range of motion. Neck supple. No thyromegaly present.  Cardiovascular: Normal rate, regular rhythm, normal heart sounds and  intact distal pulses.   No murmur heard. Pulmonary/Chest: Effort normal and breath sounds normal. No respiratory distress. She has no wheezes.  Abdominal: Soft. Bowel sounds are normal. She exhibits no distension. There is no tenderness.  Musculoskeletal: Normal range of motion. She exhibits no edema or tenderness.  Neurological: She is alert and oriented to person, place, and time.  Skin: Skin is warm and dry.  Psychiatric: She has a normal mood and affect. Her behavior is normal. Judgment and thought content normal. Slurred: stuttering   Vitals reviewed.    BP 139/89   Pulse (!) 110   Temp 97.6 F (36.4 C) (Oral)   Ht 5' 7" (1.702 m)   Wt 224 lb (101.6 kg)   BMI 35.08 kg/m      Assessment & Plan:  1. HYPERTENSION, BENIGN - CMP14+EGFR  2. Gastroesophageal reflux disease without esophagitis - CMP14+EGFR  3. Vitamin D deficiency - CMP14+EGFR  4. Speech disorder - CMP14+EGFR  5. Language difficulty - CMP14+EGFR   Continue all meds Labs pending Health Maintenance reviewed-Pt to schedule mammogram  Diet and exercise encouraged RTO 1 year   Evelina Dun, FNP

## 2016-10-30 LAB — CMP14+EGFR
ALT: 23 IU/L (ref 0–32)
AST: 20 IU/L (ref 0–40)
Albumin/Globulin Ratio: 1.4 (ref 1.2–2.2)
Albumin: 4.1 g/dL (ref 3.5–5.5)
Alkaline Phosphatase: 82 IU/L (ref 39–117)
BUN/Creatinine Ratio: 19 (ref 9–23)
BUN: 12 mg/dL (ref 6–24)
Bilirubin Total: 0.3 mg/dL (ref 0.0–1.2)
CO2: 25 mmol/L (ref 20–29)
Calcium: 9.6 mg/dL (ref 8.7–10.2)
Chloride: 102 mmol/L (ref 96–106)
Creatinine, Ser: 0.63 mg/dL (ref 0.57–1.00)
GFR calc Af Amer: 124 mL/min/{1.73_m2} (ref >59)
GFR calc non Af Amer: 107 mL/min/{1.73_m2} (ref >59)
Globulin, Total: 3 g/dL (ref 1.5–4.5)
Glucose: 106 mg/dL — ABNORMAL HIGH (ref 65–99)
Potassium: 3.6 mmol/L (ref 3.5–5.2)
Sodium: 145 mmol/L — ABNORMAL HIGH (ref 134–144)
Total Protein: 7.1 g/dL (ref 6.0–8.5)

## 2016-10-30 NOTE — Progress Notes (Signed)
Kidney and liver function stable

## 2016-11-05 ENCOUNTER — Other Ambulatory Visit: Payer: Self-pay

## 2016-11-05 MED ORDER — LISINOPRIL 20 MG PO TABS: 20.0000 mg | ORAL_TABLET | Freq: Every day | ORAL | 1 refills | Status: DC

## 2016-11-05 MED ORDER — AMLODIPINE BESYLATE 5 MG PO TABS: 5.0000 mg | ORAL_TABLET | Freq: Every day | ORAL | 1 refills | Status: DC

## 2016-11-06 ENCOUNTER — Encounter (INDEPENDENT_AMBULATORY_CARE_PROVIDER_SITE_OTHER): Payer: Self-pay

## 2016-11-06 ENCOUNTER — Ambulatory Visit (INDEPENDENT_AMBULATORY_CARE_PROVIDER_SITE_OTHER): Payer: Medicaid Other

## 2016-11-06 DIAGNOSIS — F809 Developmental disorder of speech and language, unspecified: Secondary | ICD-10-CM | POA: Diagnosis not present

## 2016-11-06 NOTE — Procedures (Signed)
    History:  Brittany Durham is a 46 year old patient with a history of a recent syncopal episode that occurred on 09/22/2016. The patient had dizziness overnight, appeared to be clammy and had lost conscious for about 2-3 minutes. The patient was noted to have an elevated heart rate of 115. The patient is being evaluated for this syncopal event.  This is a routine EEG. No skull defects are noted. Medications include Norvasc, lisinopril, Claritin, Protonix, potassium, and vitamin D supplementation.   EEG classification: Normal awake and drowsy  Description of the recording: The background rhythms of this recording consists of a fairly well modulated medium amplitude alpha rhythm of 9 Hz that is reactive to eye opening and closure. As the record progresses, the patient appears to remain in the waking state throughout the recording. Photic stimulation was performed, resulting in a bilateral and symmetric photic driving response. Hyperventilation was also performed, resulting in a minimal buildup of the background rhythm activities without significant slowing seen. Toward the end of the recording, the patient enters the drowsy state with slight symmetric slowing seen. The patient never enters stage II sleep. At no time during the recording does there appear to be evidence of spike or spike wave discharges or evidence of focal slowing. EKG monitor shows no evidence of cardiac rhythm abnormalities with a heart rate of 90.  Impression: This is a normal EEG recording in the waking and drowsy state. No evidence of ictal or interictal discharges are seen.

## 2016-11-07 ENCOUNTER — Telehealth: Payer: Self-pay | Admitting: *Deleted

## 2016-11-07 NOTE — Telephone Encounter (Signed)
Left patient a detailed message, with results, on her voicemail (ok per DPR).  Provided our number to call back with any questions.  

## 2016-11-07 NOTE — Telephone Encounter (Signed)
-----   Message from Kathrynn Ducking, MD sent at 11/06/2016  5:43 PM EDT ----- This is a patient of Dr. Krista Blue, the EEG was normal, please call the patient with the results. Thanks

## 2016-11-08 ENCOUNTER — Ambulatory Visit: Payer: Medicaid Other | Admitting: Adult Health

## 2016-12-11 ENCOUNTER — Other Ambulatory Visit: Payer: Self-pay | Admitting: Family

## 2016-12-12 NOTE — Telephone Encounter (Signed)
Last Vit D level 06/29/14  4.0

## 2016-12-27 ENCOUNTER — Encounter: Payer: Self-pay | Admitting: Neurology

## 2016-12-27 ENCOUNTER — Ambulatory Visit (INDEPENDENT_AMBULATORY_CARE_PROVIDER_SITE_OTHER): Payer: Medicaid Other | Admitting: Neurology

## 2016-12-27 VITALS — BP 160/96 | HR 97 | Ht 67.0 in | Wt 220.0 lb

## 2016-12-27 DIAGNOSIS — R55 Syncope and collapse: Secondary | ICD-10-CM | POA: Diagnosis not present

## 2016-12-27 DIAGNOSIS — R479 Unspecified speech disturbances: Secondary | ICD-10-CM

## 2016-12-27 NOTE — Progress Notes (Signed)
PATIENTMashanda Durham DOB: 09/27/1970  Chief Complaint  Patient presents with  . Rm 4  . Follow-up    Daughter, Brittany Durham and grandson  . Speech Problem    Pt returns for f/u after normal EEG. Has not yet started speech therapy.     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, she had left knee arthroscopic surgery under general anesthesia in December 09 2014 at [facility], initially she recovered very well  While changing her left knee wrap at home in July 31st 2016, she complained of pain, started hyperventilation, had transient loss of consciousness, EMS was called, no seizure-like activity, she was noted to have difficulty talking, stuttering of the speech, he was taken to the emergency room, had extensive evaluations,  I have reviewed the record, EEG  and echocardiogram was normal in August 2016  I personally reviewed MRI/MRA of the brain that was normal, laboratory showed normal CMP, CBC, UDS was positive for opiates  Her speech overall has much improved, but not back to her baseline yet, she denies visual change, no arm weakness,   Patient was seen by ENT, no etiology found.  Hospital admission in May 2018, she was noted for passing out episode, she reported dizziness overnight, passing out at home which prompted her daughter to call EMS, patient appeared clammy, she had loss of consciousness for 2-3 minutes, was noted to have elevated heart rate 115, there was also noted transient loss of consciousness during her ED stay, telemetry monitoring was significant only for sinus tachycardia, there was no arrhythmia noted, laboratory showed low potassium 3.2, otherwise no significant abnormality, EKG showed sinus tachycardia, with heart rate of  122,  Echocardiogram on Sep 21 2016 showed ejection fraction 60-65%, wall motion were normal.  Laboratory showed hemoglobin of 12 point 3, UDS was negative Negative HIV, d-dimer, troponin,  She was discharged home with lower dose of blood pressure medication, amlodipine was decreased from 10 to 5 mg, lisinopril from 40 to 20 mg,  I personally reviewed MRI of the brain December 2017 that was normal.  She is now on disability, no longer work, she still has speech difficulty,  There was no significant change,  UPDATE December 27 2016: EEG was normal in June 2018.   Echocardiogram showed ejection fraction 60-65%, wall motion was normal Laboratory evaluation in October 29 2016 showed normal CMP,CBC,  She no longer has dizziness/passing out spell after adjustment for blood pressure medications, complains of significant language difficulty, no difficulty eating, breathing, want to  receive  speech therapy  REVIEW OF SYSTEMS: Full 14 system review of systems performed and notable only for frequent urination, dizziness, passing out, speech difficulty, snoring,  ALLERGIES: No Known Allergies  HOME MEDICATIONS: Current Outpatient Prescriptions  Medication Sig Dispense Refill  . acetaminophen (TYLENOL) 500 MG tablet Take 1,000 mg by mouth every 6 (six) hours as needed for mild pain or moderate pain.     Marland Kitchen amLODipine (NORVASC) 5 MG tablet Take 1 tablet (5 mg total) by mouth daily. 90 tablet 1  . lisinopril (PRINIVIL,ZESTRIL) 20 MG tablet Take 1 tablet (20 mg total) by mouth daily. 90 tablet 1  . loratadine (CLARITIN) 10 MG tablet Take 10 mg by mouth daily as needed for allergies.    Marland Kitchen  pantoprazole (PROTONIX) 40 MG tablet Take 1 tablet (40 mg total) by mouth 2 (two) times daily before a meal. 60 tablet 2  . Potassium 95 MG TABS Take 99 mg by mouth daily.    . Vitamin D, Ergocalciferol, (DRISDOL) 50000 units CAPS capsule TAKE 1 CAPSULE ONCE A WEEK 4 capsule 0   No current facility-administered  medications for this visit.     PAST MEDICAL HISTORY: Past Medical History:  Diagnosis Date  . Hypertension   . Knee pain    left meniscus tear s/p repair 11/2014  . Seizures ([place])   . Stuttering    patient reports this started after her knee surgery in 11/2014.   Marland Kitchen Thyroid nodule     PAST SURGICAL HISTORY: Past Surgical History:  Procedure Laterality Date  . COLONOSCOPY N/A 08/23/2016   Procedure: COLONOSCOPY;  Surgeon: Daneil Dolin, MD;  [facility];  Service: Endoscopy;  Laterality: N/A;  1:00pm  . ESOPHAGOGASTRODUODENOSCOPY N/A 05/03/2016   Procedure: ESOPHAGOGASTRODUODENOSCOPY (EGD);  Surgeon: Daneil Dolin, MD;  [facility];  Service: Endoscopy;  Laterality: N/A;  . ESOPHAGOGASTRODUODENOSCOPY N/A 08/23/2016   Procedure: ESOPHAGOGASTRODUODENOSCOPY (EGD);  Surgeon: Daneil Dolin, MD;  [facility];  Service: Endoscopy;  Laterality: N/A;  . KNEE ARTHROSCOPY WITH MEDIAL MENISECTOMY Left 12/09/2014   Procedure: KNEE ARTHROSCOPY WITH PARTIAL MEDIAL AND LATERAL MENISECTOMY;  Surgeon: Sanjuana Kava, MD;  Location: [facility];  Service: Orthopedics;  Laterality: Left;  . TUBAL LIGATION    . WISDOM TOOTH EXTRACTION      FAMILY HISTORY: Family History  Problem Relation Age of Onset  . Hypertension Mother   . Hypertension Brother   . Colon cancer Neg Hx     SOCIAL HISTORY:  Social History   Social History  . Marital status: Single    Spouse name: N/A  . Number of children: 1  . Years of education: 97   Occupational History  . Umemployed    Social History Main Topics  . Smoking status: Never Smoker  . Smokeless tobacco: Never Used  . Alcohol use No  . Drug use: No  . Sexual activity: Yes    Birth control/ protection: Surgical   Other Topics Concern  . Not on file   Social History Narrative   Lives at home alone.   Right-handed.   Occasional caffeine use.     PHYSICAL EXAM   Vitals:   12/27/16 1203  BP: (!) 160/96  Pulse:  97  Weight: 220 lb (99.8 kg)  Height: 5\' 7"  (1.702 m)    Not recorded      Body mass index is 34.46 kg/m.  PHYSICAL EXAMNIATION:  Gen: NAD, conversant, well nourised, obese, well groomed                     Cardiovascular: Regular rate rhythm, no peripheral edema, warm, nontender. Eyes: Conjunctivae clear without exudates or hemorrhage Neck: Supple, no carotid bruits. Pulmonary: Clear to auscultation bilaterally   NEUROLOGICAL EXAM:  MENTAL STATUS: Speech:  Stuttering of speech, normal comprehension, follow commands Cognition:     Orientation to time, place and person     Normal recent and remote memory     Normal Attention span and concentration     Normal Language, naming, repeating,spontaneous speech     Fund of knowledge   CRANIAL NERVES: CN II: Visual fields are full to confrontation. Fundoscopic exam is normal with sharp discs and no vascular changes. Pupils  are round equal and briskly reactive to light. CN III, IV, VI: extraocular movement are normal. No ptosis. CN V: Facial sensation is intact to pinprick in all 3 divisions bilaterally. Corneal responses are intact.  CN VII: Face is symmetric with normal eye closure and smile. CN VIII: Hearing is normal to rubbing fingers CN IX, X: Palate elevates symmetrically. Phonation is normal. CN XI: Head turning and shoulder shrug are intact CN XII: Tongue is midline with normal movements and no atrophy.  MOTOR: There is no pronator drift of out-stretched arms. Muscle bulk and tone are normal. Muscle strength is normal.  REFLEXES: Reflexes are 2+ and symmetric at the biceps, triceps, knees, and ankles. Plantar responses are flexor.  SENSORY: Intact to light touch, pinprick, positional sensation and vibratory sensation are intact in fingers and toes.  COORDINATION: Rapid alternating movements and fine finger movements are intact. There is no dysmetria on finger-to-nose and heel-knee-shin.    GAIT/STANCE: Posture is  normal. Gait is steady with normal steps, base, arm swing, and turning. Heel and toe walking are normal. Tandem gait is normal.  Romberg is absent.   DIAGNOSTIC DATA (LABS, IMAGING, TESTING) - I reviewed patient records, labs, notes, testing and imaging myself where available.   ASSESSMENT AND PLAN  Asra Gambrel is a 46 y.o. female   Stuttering of the speech   no etiology found,  We will refer her to speech pathologist  EEG was normal.  Refer to speech therapy Syncopy  Related to low blood pressure, bp medication was decreased.  I have suggested her keep well hydration,  Frequent monitoring of her blood pressure  Marcial Pacas, M.D. Ph.D.  [facility] [address] [place],  [address] Ph: [phone] Fax: [phone]  CC: Referring Provider

## 2017-02-15 ENCOUNTER — Other Ambulatory Visit: Payer: Self-pay | Admitting: Nurse Practitioner

## 2017-02-15 DIAGNOSIS — K279 Peptic ulcer, site unspecified, unspecified as acute or chronic, without hemorrhage or perforation: Secondary | ICD-10-CM

## 2017-03-19 ENCOUNTER — Other Ambulatory Visit: Payer: Self-pay | Admitting: Family

## 2017-03-20 NOTE — Telephone Encounter (Signed)
Last Vit D 06/29/14  4.0

## 2017-04-27 ENCOUNTER — Other Ambulatory Visit: Payer: Self-pay

## 2017-04-27 ENCOUNTER — Emergency Department (HOSPITAL_COMMUNITY): Payer: Medicaid Other

## 2017-04-27 ENCOUNTER — Encounter (HOSPITAL_COMMUNITY): Payer: Self-pay | Admitting: *Deleted

## 2017-04-27 ENCOUNTER — Emergency Department (HOSPITAL_COMMUNITY)
Admission: EM | Admit: 2017-04-27 | Discharge: 2017-04-27 | Disposition: A | Discharge status: Home / Self Care | Payer: Medicaid Other | Attending: Emergency Medicine | Admitting: Emergency Medicine

## 2017-04-27 DIAGNOSIS — Y999 Unspecified external cause status: Secondary | ICD-10-CM | POA: Insufficient documentation

## 2017-04-27 DIAGNOSIS — S8001XA Contusion of right knee, initial encounter: Secondary | ICD-10-CM | POA: Insufficient documentation

## 2017-04-27 DIAGNOSIS — Z79899 Other long term (current) drug therapy: Secondary | ICD-10-CM | POA: Diagnosis not present

## 2017-04-27 DIAGNOSIS — Y929 Unspecified place or not applicable: Secondary | ICD-10-CM | POA: Insufficient documentation

## 2017-04-27 DIAGNOSIS — W009XXA Unspecified fall due to ice and snow, initial encounter: Secondary | ICD-10-CM | POA: Diagnosis not present

## 2017-04-27 DIAGNOSIS — S8991XA Unspecified injury of right lower leg, initial encounter: Secondary | ICD-10-CM | POA: Diagnosis present

## 2017-04-27 DIAGNOSIS — Y939 Activity, unspecified: Secondary | ICD-10-CM | POA: Insufficient documentation

## 2017-04-27 DIAGNOSIS — I10 Essential (primary) hypertension: Secondary | ICD-10-CM | POA: Insufficient documentation

## 2017-04-27 MED ORDER — TRAMADOL HCL 50 MG PO TABS: 50.0000 mg | ORAL_TABLET | Freq: Four times a day (QID) | ORAL | 0 refills | Status: DC | PRN

## 2017-04-27 MED ORDER — DICLOFENAC SODIUM 75 MG PO TBEC: 75.0000 mg | DELAYED_RELEASE_TABLET | Freq: Two times a day (BID) | ORAL | 0 refills | Status: DC

## 2017-04-27 NOTE — ED Notes (Signed)
From rad 

## 2017-04-27 NOTE — ED Notes (Signed)
To radiology

## 2017-04-27 NOTE — ED Notes (Signed)
Pt reports falling on ice with continued rib pain as well as knee pain

## 2017-04-27 NOTE — ED Provider Notes (Signed)
[facility] EMERGENCY DEPARTMENT Provider Note   CSN: [id number] Arrival date & time: 04/27/17  1548     History   Chief Complaint Chief Complaint  Patient presents with  . Fall    HPI Brittany Durham is a 46 y.o. female with past medical history as outlined below, most significant for prior left knee meniscectomy presenting with right knee pain since a fall which occurred 4 days ago.  She slipped on ice landing directly on the right knee which has been painful since the event.  She denies weakness in the joint and there is no radiation of pain.  She reports having mild pain in her right rib cage which is improved today.  She denies head injury or other pain.  She has had no treatments prior to arrival.  The history is provided by the patient.    Past Medical History:  Diagnosis Date  . Hypertension   . Knee pain    left meniscus tear s/p repair 11/2014  . Seizures ([place])   . Stuttering    patient reports this started after her knee surgery in 11/2014.   Marland Kitchen Thyroid nodule     Patient Active Problem List   Diagnosis Date Noted  . Language difficulty 09/25/2016  . Nausea with vomiting 07/18/2016  . PUD (peptic ulcer disease) 07/18/2016  . Slurred speech   . GI bleed 05/02/2016  . Upper GI bleed 05/02/2016  . Sinus tachycardia 05/02/2016  . Acute blood loss anemia 05/02/2016  . Abdominal pain, epigastric 05/02/2016  . 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 Thyroid nodule 12/14/2014  . Left knee pain 12/14/2014  . Seizures ([place])   . Altered mental status 12/12/2014  . Vitamin D deficiency 06/30/2014  . GERD (gastroesophageal reflux disease) 06/29/2014  . SYNCOPE 03/02/2010  . HYPERTENSION, BENIGN 03/01/2010    Past Surgical History:  Procedure Laterality Date  . COLONOSCOPY N/A 08/23/2016   Procedure: COLONOSCOPY;  Surgeon: Daneil Dolin, MD;  Location: [facility];  Service: Endoscopy;  Laterality: N/A;  1:00pm  . ESOPHAGOGASTRODUODENOSCOPY N/A 05/03/2016   Procedure:  ESOPHAGOGASTRODUODENOSCOPY (EGD);  Surgeon: Daneil Dolin, MD;  Location: [facility];  Service: Endoscopy;  Laterality: N/A;  . ESOPHAGOGASTRODUODENOSCOPY N/A 08/23/2016   Procedure: ESOPHAGOGASTRODUODENOSCOPY (EGD);  Surgeon: Daneil Dolin, MD;  Location: [facility];  Service: Endoscopy;  Laterality: N/A;  . KNEE ARTHROSCOPY WITH MEDIAL MENISECTOMY Left 12/09/2014   Procedure: KNEE ARTHROSCOPY WITH PARTIAL MEDIAL AND LATERAL MENISECTOMY;  Surgeon: Sanjuana Kava, MD;  Location: [facility] ORS;  Service: Orthopedics;  Laterality: Left;  . TUBAL LIGATION    . WISDOM TOOTH EXTRACTION      OB History    Gravida Para Term Preterm AB Living   1 1 1     1    SAB TAB Ectopic Multiple Live Births                   Home Medications    Prior to Admission medications   Medication Sig Start Date End Date Taking? Authorizing Provider  acetaminophen (TYLENOL) 500 MG tablet Take 1,000 mg by mouth every 6 (six) hours as needed for mild pain or moderate pain.     [provider]  amLODipine (NORVASC) 5 MG tablet Take 1 tablet (5 mg total) by mouth daily. 11/05/16 11/05/17  Sharion Balloon, FNP  diclofenac (VOLTAREN) 75 MG EC tablet Take 1 tablet (75 mg total) by mouth 2 (two) times daily. 04/27/17   Evalee Jefferson, PA-C  lisinopril (PRINIVIL,ZESTRIL) 20 MG tablet Take 1 tablet (20 mg total) by mouth daily. 11/05/16 11/05/17  Sharion Balloon, FNP  loratadine (CLARITIN) 10 MG tablet Take 10 mg by mouth daily as needed for allergies.    [provider]  pantoprazole (PROTONIX) 40 MG tablet Take 1 tablet by mouth 2 (two) times daily before a meal. 02/18/17   Carlis Stable, NP  Potassium 95 MG TABS Take 99 mg by mouth daily.    [provider]  traMADol (ULTRAM) 50 MG tablet Take 1 tablet (50 mg total) by mouth every 6 (six) hours as needed. 04/27/17   Evalee Jefferson, PA-C  Vitamin D, Ergocalciferol, (DRISDOL) 50000 units CAPS capsule TAKE 1 CAPSULE ONCE A WEEK 03/21/17   Sharion Balloon, FNP     Family History Family History  Problem Relation Age of Onset  . Hypertension Mother   . Hypertension Brother   . Colon cancer Neg Hx     Social History Social History   Tobacco Use  . Smoking status: Never Smoker  . Smokeless tobacco: Never Used  Substance Use Topics  . Alcohol use: No  . Drug use: No     Allergies   Patient has no known allergies.   Review of Systems Review of Systems  Constitutional: Negative for fever.  Musculoskeletal: Positive for arthralgias. Negative for joint swelling and myalgias.  Neurological: Negative for weakness and numbness.     Physical Exam Updated Vital Signs BP (!) 166/99 (BP Location: Left Arm)   Pulse (!) 115   Temp 98.5 F (36.9 C) (Oral)   Resp 18   Ht 5\' 6"  (1.676 m)   Wt 99.8 kg (220 lb)   LMP 03/22/2017 Comment: irregular periods  SpO2 100%   BMI 35.51 kg/m   Physical Exam  Constitutional: She appears well-developed and well-nourished.  HENT:  Head: Atraumatic.  Neck: Normal range of motion.  Cardiovascular:  Pulses equal bilaterally  Musculoskeletal: She exhibits tenderness. She exhibits no edema or deformity.       Right knee: She exhibits bony tenderness. She exhibits no swelling, no effusion, no ecchymosis and no deformity. Tenderness found. Medial joint line, lateral joint line and patellar tendon tenderness noted.  Generalized right knee with palpation  which does not localize to a specific structure or location.  She has no LCL or MCL laxity, no crepitus with range of motion, negative drawer test.  Neurological: She is alert. She has normal strength. She displays normal reflexes. No sensory deficit.  Skin: Skin is warm and dry.  Psychiatric: She has a normal mood and affect.     ED Treatments / Results  Labs (all labs ordered are listed, but only abnormal results are displayed) Labs Reviewed - No data to display  EKG  EKG Interpretation None       Radiology Dg Knee Complete 4 Views  Right  Result Date: 04/27/2017 CLINICAL DATA:  Pain after fall EXAM: RIGHT KNEE - COMPLETE 4+ VIEW COMPARISON:  None. FINDINGS: Pain after fall IMPRESSION: Negative. Electronically Signed   By: Dorise Bullion III M.D   On: 04/27/2017 18:49    Procedures Procedures (including critical care time)  Medications Ordered in ED Medications - No data to display   Initial Impression / Assessment and Plan / ED Course  I have reviewed the triage vital signs and the nursing notes.  Pertinent labs & imaging results that were available during my care of the patient were reviewed by me and considered  in my medical decision making (see chart for details).     Patient with normal imaging this evening.  She was given an Ace wrap, diclofenac and tramadol prescribed.  Advised as needed follow-up with her orthopedist if symptoms worsen or do not improve over the next 1-2 weeks.  Final Clinical Impressions(s) / ED Diagnoses   Final diagnoses:  Contusion of right knee, initial encounter    ED Discharge Orders        Ordered    diclofenac (VOLTAREN) 75 MG EC tablet  2 times daily     04/27/17 1921    traMADol (ULTRAM) 50 MG tablet  Every 6 hours PRN     04/27/17 1921       Evalee Jefferson, Hershal Coria 04/27/17 2335    Sherwood Gambler, MD 04/28/17 [phone]

## 2017-04-27 NOTE — ED Triage Notes (Signed)
Pt fell after slipping on ice on Tuesday, began to have pain yesterday to left ribs and right knee. Pt denies hitting her head.

## 2017-04-27 NOTE — Discharge Instructions (Addendum)
The x-ray of your right knee is negative today for any acute injury.  I suspect  you may have some deep bruising in the knee you may use elevation and a heating pad applied 20 minutes several times daily which may help heal this quicker.  Use the medications prescribed, do not drive within 4 hours of taking tramadol as this medicine may make you drowsy.  Plan Ace wrap as we have done this evening.  Plan to see Dr. Luna Glasgow if your symptoms are not improving over the next 1-2 weeks.

## 2017-04-30 ENCOUNTER — Ambulatory Visit (INDEPENDENT_AMBULATORY_CARE_PROVIDER_SITE_OTHER): Payer: Medicaid Other | Admitting: Family

## 2017-04-30 ENCOUNTER — Other Ambulatory Visit: Payer: Self-pay | Admitting: Family

## 2017-04-30 ENCOUNTER — Encounter: Payer: Self-pay | Admitting: Family

## 2017-04-30 VITALS — BP 155/95 | HR 109 | Temp 97.6°F | Ht 66.0 in | Wt 219.0 lb

## 2017-04-30 DIAGNOSIS — R479 Unspecified speech disturbances: Secondary | ICD-10-CM

## 2017-04-30 DIAGNOSIS — Z0000 Encounter for general adult medical examination without abnormal findings: Secondary | ICD-10-CM | POA: Diagnosis not present

## 2017-04-30 DIAGNOSIS — I10 Essential (primary) hypertension: Secondary | ICD-10-CM

## 2017-04-30 DIAGNOSIS — Z01419 Encounter for gynecological examination (general) (routine) without abnormal findings: Secondary | ICD-10-CM

## 2017-04-30 DIAGNOSIS — K219 Gastro-esophageal reflux disease without esophagitis: Secondary | ICD-10-CM

## 2017-04-30 MED ORDER — AMLODIPINE BESYLATE 10 MG PO TABS: 10.0000 mg | ORAL_TABLET | Freq: Every day | ORAL | 3 refills | Status: DC

## 2017-04-30 NOTE — Patient Instructions (Signed)

## 2017-04-30 NOTE — Progress Notes (Signed)
Subjective:    Patient ID: Brittany Durham, female    DOB: Mar 23, 1971, 46 y.o.   MRN: [id number]  PT presents to the office today for CPE with pap. Pt is followed by Neurologists for Speech difficulties.  Hypertension  This is a chronic problem. The current episode started more than 1 year ago. The problem has been waxing and waning since onset. The problem is uncontrolled. Pertinent negatives include no malaise/fatigue, peripheral edema or shortness of breath. Risk factors for coronary artery disease include sedentary lifestyle. The current treatment provides mild improvement. Hypertensive end-organ damage includes CVA. There is no history of kidney disease, CAD/MI or heart failure.  Gastroesophageal Reflux  She complains of coughing. She reports no belching or no heartburn. This is a chronic problem. The current episode started more than 1 year ago. The problem occurs occasionally. The problem has been waxing and waning. The symptoms are aggravated by certain foods. Risk factors include obesity. She has tried a PPI for the symptoms. The treatment provided moderate relief.      Review of Systems  Constitutional: Negative for malaise/fatigue.  Respiratory: Positive for cough. Negative for shortness of breath.   Gastrointestinal: Negative for heartburn.  All other systems reviewed and are negative.      Objective:   Physical Exam  Constitutional: She is oriented to person, place, and time. She appears well-developed and well-nourished. No distress.  HENT:  Head: Normocephalic and atraumatic.  Right Ear: External ear normal.  Left Ear: External ear normal.  Nose: Nose normal.  Mouth/Throat: Oropharynx is clear and moist.  Eyes: Pupils are equal, round, and reactive to light.  Neck: Normal range of motion. Neck supple. No thyromegaly present.  Cardiovascular: Normal rate, regular rhythm, normal heart sounds and intact distal pulses.  No murmur heard. Pulmonary/Chest: Effort normal and  breath sounds normal. No respiratory distress. She has no wheezes.  Abdominal: Soft. Bowel sounds are normal. She exhibits no distension. There is no tenderness.  Musculoskeletal: Normal range of motion. She exhibits no edema or tenderness.  Neurological: She is alert and oriented to person, place, and time.  Skin: Skin is warm and dry.  Psychiatric: She has a normal mood and affect. Her behavior is normal. Judgment and thought content normal.  Vitals reviewed.     BP (!) 155/95   Pulse (!) 109   Temp 97.6 F (36.4 C) (Oral)   Ht 5' 6"  (1.676 m)   Wt 219 lb (99.3 kg)   LMP 03/22/2017   BMI 35.35 kg/m      Assessment & Plan:  1. HYPERTENSION, BENIGN Amlodipine increased to 10 mg from 5 mg  -Daily blood pressure log given with instructions on how to fill out and told to bring to next visit -Dash diet information given -Exercise encouraged - Stress Management  -Continue current meds -RTO in 2 weeks  - CMP14+EGFR - amLODipine (NORVASC) 10 MG tablet; Take 1 tablet (10 mg total) by mouth daily.  Dispense: 90 tablet; Refill: 3  2. Gastroesophageal reflux disease without esophagitis - CMP14+EGFR  3. Speech disorder - CMP14+EGFR  4. Morbid obesity (HCC) - CMP14+EGFR  5. Annual physical exam - Anemia Profile B - CMP14+EGFR - Lipid panel - TSH - VITAMIN D 25 Hydroxy (Vit-D Deficiency, Fractures) - Pap IG w/ reflex to HPV when ASC-U  6. Encounter for gynecological examination without abnormal finding - CMP14+EGFR - Pap IG w/ reflex to HPV when ASC-U   Continue all meds Labs pending Health Maintenance reviewed Diet  and exercise encouraged RTO 2 weeks to recheck HTN  Evelina Dun, FNP

## 2017-05-01 LAB — LIPID PANEL
Chol/HDL Ratio: 3.4 ratio (ref 0.0–4.4)
Cholesterol, Total: 168 mg/dL (ref 100–199)
HDL: 49 mg/dL (ref >39)
LDL Calculated: 101 mg/dL — ABNORMAL HIGH (ref 0–99)
Triglycerides: 88 mg/dL (ref 0–149)
VLDL Cholesterol Cal: 18 mg/dL (ref 5–40)

## 2017-05-01 LAB — CMP14+EGFR
ALT: 23 IU/L (ref 0–32)
AST: 19 IU/L (ref 0–40)
Albumin/Globulin Ratio: 1.5 (ref 1.2–2.2)
Albumin: 4.6 g/dL (ref 3.5–5.5)
Alkaline Phosphatase: 97 IU/L (ref 39–117)
BUN/Creatinine Ratio: 14 (ref 9–23)
BUN: 12 mg/dL (ref 6–24)
Bilirubin Total: 0.3 mg/dL (ref 0.0–1.2)
CO2: 25 mmol/L (ref 20–29)
Calcium: 10 mg/dL (ref 8.7–10.2)
Chloride: 103 mmol/L (ref 96–106)
Creatinine, Ser: 0.85 mg/dL (ref 0.57–1.00)
GFR calc Af Amer: 94 mL/min/{1.73_m2} (ref >59)
GFR calc non Af Amer: 81 mL/min/{1.73_m2} (ref >59)
Globulin, Total: 3 g/dL (ref 1.5–4.5)
Glucose: 98 mg/dL (ref 65–99)
Potassium: 4 mmol/L (ref 3.5–5.2)
Sodium: 143 mmol/L (ref 134–144)
Total Protein: 7.6 g/dL (ref 6.0–8.5)

## 2017-05-01 LAB — ANEMIA PROFILE B
Basophils Absolute: 0 10*3/uL (ref 0.0–0.2)
Basos: 0 %
EOS (ABSOLUTE): 0.1 10*3/uL (ref 0.0–0.4)
Eos: 2 %
Ferritin: 10 ng/mL — ABNORMAL LOW (ref 15–150)
Folate: 12.5 ng/mL (ref >3.0)
Hematocrit: 34.2 % (ref 34.0–46.6)
Hemoglobin: 11.2 g/dL (ref 11.1–15.9)
Immature Grans (Abs): 0 10*3/uL (ref 0.0–0.1)
Immature Granulocytes: 0 %
Iron Saturation: 12 % — ABNORMAL LOW (ref 15–55)
Iron: 50 ug/dL (ref 27–159)
Lymphocytes Absolute: 2.3 10*3/uL (ref 0.7–3.1)
Lymphs: 35 %
MCH: 27.9 pg (ref 26.6–33.0)
MCHC: 32.7 g/dL (ref 31.5–35.7)
MCV: 85 fL (ref 79–97)
Monocytes Absolute: 0.4 10*3/uL (ref 0.1–0.9)
Monocytes: 6 %
Neutrophils Absolute: 3.9 10*3/uL (ref 1.4–7.0)
Neutrophils: 57 %
Platelets: 236 10*3/uL (ref 150–379)
RBC: 4.01 x10E6/uL (ref 3.77–5.28)
RDW: 14.1 % (ref 12.3–15.4)
Retic Ct Pct: 2 % (ref 0.6–2.6)
Total Iron Binding Capacity: 407 ug/dL (ref 250–450)
UIBC: 357 ug/dL (ref 131–425)
Vitamin B-12: 443 pg/mL (ref 232–1245)
WBC: 6.8 10*3/uL (ref 3.4–10.8)

## 2017-05-01 LAB — TSH: TSH: 2.51 u[IU]/mL (ref 0.450–4.500)

## 2017-05-01 LAB — VITAMIN D 25 HYDROXY (VIT D DEFICIENCY, FRACTURES): Vit D, 25-Hydroxy: 34.8 ng/mL (ref 30.0–100.0)

## 2017-05-08 LAB — PAP IG W/ RFLX HPV ASCU: PAP Smear Comment: 0

## 2017-05-08 LAB — HPV DNA PROBE HIGH RISK, AMPLIFIED: HPV, high-risk: NEGATIVE

## 2017-05-09 ENCOUNTER — Telehealth: Payer: Self-pay | Admitting: Family

## 2017-05-09 NOTE — Telephone Encounter (Signed)
Patient aware of lab results.

## 2017-05-20 ENCOUNTER — Ambulatory Visit (INDEPENDENT_AMBULATORY_CARE_PROVIDER_SITE_OTHER): Payer: Medicare Other | Admitting: Family

## 2017-05-20 ENCOUNTER — Encounter: Payer: Self-pay | Admitting: Family

## 2017-05-20 ENCOUNTER — Other Ambulatory Visit: Payer: Self-pay | Admitting: Family

## 2017-05-20 VITALS — BP 128/88 | HR 100 | Temp 98.3°F | Ht 66.0 in | Wt 219.0 lb

## 2017-05-20 DIAGNOSIS — I10 Essential (primary) hypertension: Secondary | ICD-10-CM | POA: Diagnosis not present

## 2017-05-20 NOTE — Progress Notes (Signed)
   Subjective:    Patient ID: Brittany Durham, female    DOB: [birth year], 47 y.o.   MRN: [id number]  Pt presents to the office today to recheck HTN. Pt's BP is at goal.  Hypertension  This is a chronic problem. The current episode started more than 1 year ago. The problem has been resolved since onset. The problem is controlled. Pertinent negatives include no headaches, peripheral edema or shortness of breath. The current treatment provides moderate improvement. There is no history of kidney disease or heart failure.      Review of Systems  Respiratory: Negative for shortness of breath.   Neurological: Negative for headaches.  All other systems reviewed and are negative.      Objective:   Physical Exam  Constitutional: She is oriented to person, place, and time. She appears well-developed and well-nourished. No distress.  HENT:  Head: Normocephalic.  Eyes: Pupils are equal, round, and reactive to light.  Neck: Normal range of motion. Neck supple. No thyromegaly present.  Cardiovascular: Normal rate, regular rhythm, normal heart sounds and intact distal pulses.  No murmur heard. Pulmonary/Chest: Effort normal and breath sounds normal. No respiratory distress. She has no wheezes.  Abdominal: Soft. Bowel sounds are normal. She exhibits no distension. There is no tenderness.  Musculoskeletal: Normal range of motion. She exhibits no edema or tenderness.  Neurological: She is alert and oriented to person, place, and time.  Skin: Skin is warm and dry.  Psychiatric: She has a normal mood and affect. Her behavior is normal. Judgment and thought content normal.  Vitals reviewed.     BP 128/88   Pulse 100   Temp 98.3 F (36.8 C) (Oral)   Ht '5\' 6"'$  (1.676 m)   Wt 219 lb (99.3 kg)   BMI 35.35 kg/m      Assessment & Plan:  1. HYPERTENSION, BENIGN -Dash diet information given -Exercise encouraged - Stress Management  -Continue current meds -RTO in 6 months -  BMP8+EGFR    Evelina Dun, FNP

## 2017-05-20 NOTE — Patient Instructions (Signed)

## 2017-05-21 LAB — BMP8+EGFR
BUN/Creatinine Ratio: 14 (ref 9–23)
BUN: 10 mg/dL (ref 6–24)
CO2: 25 mmol/L (ref 20–29)
Calcium: 9.6 mg/dL (ref 8.7–10.2)
Chloride: 102 mmol/L (ref 96–106)
Creatinine, Ser: 0.74 mg/dL (ref 0.57–1.00)
GFR calc Af Amer: 111 mL/min/{1.73_m2} (ref >59)
GFR calc non Af Amer: 96 mL/min/{1.73_m2} (ref >59)
Glucose: 106 mg/dL — ABNORMAL HIGH (ref 65–99)
Potassium: 3.8 mmol/L (ref 3.5–5.2)
Sodium: 142 mmol/L (ref 134–144)

## 2017-06-17 ENCOUNTER — Other Ambulatory Visit: Payer: Self-pay | Admitting: Family

## 2017-07-19 ENCOUNTER — Ambulatory Visit (INDEPENDENT_AMBULATORY_CARE_PROVIDER_SITE_OTHER): Payer: Medicare Other

## 2017-07-19 ENCOUNTER — Other Ambulatory Visit: Payer: Self-pay | Admitting: *Deleted

## 2017-07-19 DIAGNOSIS — M79644 Pain in right finger(s): Secondary | ICD-10-CM | POA: Diagnosis not present

## 2017-07-19 DIAGNOSIS — M7989 Other specified soft tissue disorders: Secondary | ICD-10-CM | POA: Diagnosis not present

## 2017-07-20 ENCOUNTER — Ambulatory Visit (INDEPENDENT_AMBULATORY_CARE_PROVIDER_SITE_OTHER): Payer: Medicare Other | Admitting: Nurse Practitioner

## 2017-07-20 VITALS — BP 148/98 | HR 94 | Temp 97.1°F | Ht 66.0 in | Wt 221.0 lb

## 2017-07-20 DIAGNOSIS — M67441 Ganglion, right hand: Secondary | ICD-10-CM | POA: Diagnosis not present

## 2017-07-20 NOTE — Progress Notes (Signed)
   Subjective:    Patient ID: Brittany Durham, female    DOB: 08-07-1970, 47 y.o.   MRN: [id number]  HPI Patient comes in today to have her right fifth finger looked at. She developed and blood blister on her finger yesterday. Does nt know what caused it. She denies any injury.    Review of Systems  Respiratory: Negative.   Cardiovascular: Negative.   Musculoskeletal:       Right fifth finger pain.  All other systems reviewed and are negative.      Objective:   Physical Exam  Constitutional: She is oriented to person, place, and time. She appears well-developed and well-nourished. She appears distressed.  Cardiovascular: Normal rate and regular rhythm.  Pulmonary/Chest: Effort normal.  Musculoskeletal:  Cystic lesion , tender to touch on right fifth finger at proximal pip joint. pain with full extension of finger  Neurological: She is alert and oriented to person, place, and time.  Skin: Skin is warm.  Psychiatric: She has a normal mood and affect. Her behavior is normal. Judgment and thought content normal.   BP (!) 148/98   Pulse 94   Temp (!) 97.1 F (36.2 C) (Oral)   Ht 5\' 6"  (1.676 m)   Wt 221 lb (100.2 kg)   BMI 35.67 kg/m         Assessment & Plan:   1. Ganglion cyst of finger of right hand    Motrin OTC pain Soak in epsom salt BIID Ice BID Orders Placed This Encounter  Procedures  . Ambulatory referral to Orthopedic Surgery    Referral Priority:   Urgent    Referral Type:   Surgical    Referral Reason:   Specialty Services Required    Requested Specialty:   Orthopedic Surgery    Number of Visits Requested:   [place], [place]

## 2017-07-20 NOTE — Patient Instructions (Signed)
Ganglion Cyst A ganglion cyst is a noncancerous, fluid-filled lump that occurs near joints or tendons. The ganglion cyst grows out of a joint or the lining of a tendon. It most often develops in the hand or wrist, but it can also develop in the shoulder, elbow, hip, knee, ankle, or foot. The round or oval ganglion cyst can be the size of a pea or larger than a grape. Increased activity may enlarge the size of the cyst because more fluid starts to build up. What are the causes? It is not known what causes a ganglion cyst to grow. However, it may be related to:  Inflammation or irritation around the joint.  An injury.  Repetitive movements or overuse.  Arthritis.  What increases the risk? Risk factors include:  Being a woman.  Being age 20-50.  What are the signs or symptoms? Symptoms may include:  A lump. This most often appears on the hand or wrist, but it can occur in other areas of the body.  Tingling.  Pain.  Numbness.  Muscle weakness.  Weak grip.  Less movement in a joint.  How is this diagnosed? Ganglion cysts are most often diagnosed based on a physical exam. Your health care provider will feel the lump and may shine a light alongside it. If it is a ganglion cyst, a light often shines through it. Your health care provider may order an X-ray, ultrasound, or MRI to rule out other conditions. How is this treated? Ganglion cysts usually go away on their own without treatment. If pain or other symptoms are involved, treatment may be needed. Treatment is also needed if the ganglion cyst limits your movement or if it gets infected. Treatment may include:  Wearing a brace or splint on your wrist or finger.  Taking anti-inflammatory medicine.  Draining fluid from the lump with a needle (aspiration).  Injecting a steroid into the joint.  Surgery to remove the ganglion cyst.  Follow these instructions at home:  Do not press on the ganglion cyst, poke it with a  needle, or hit it.  Take medicines only as directed by your health care provider.  Wear your brace or splint as directed by your health care provider.  Watch your ganglion cyst for any changes.  Keep all follow-up visits as directed by your health care provider. This is important. Contact a health care provider if:  Your ganglion cyst becomes larger or more painful.  You have increased redness, red streaks, or swelling.  You have pus coming from the lump.  You have weakness or numbness in the affected area.  You have a fever or chills. This information is not intended to replace advice given to you by your health care provider. Make sure you discuss any questions you have with your health care provider. Document Released: 04/27/2000 Document Revised: 10/06/2015 Document Reviewed: 10/13/2013 Elsevier Interactive Patient Education  2018 [employer]  

## 2017-07-25 ENCOUNTER — Ambulatory Visit (INDEPENDENT_AMBULATORY_CARE_PROVIDER_SITE_OTHER): Payer: Medicare Other | Admitting: Orthopaedic Surgery

## 2017-07-25 ENCOUNTER — Telehealth: Payer: Self-pay | Admitting: Family

## 2017-07-25 NOTE — Telephone Encounter (Signed)
Pt needs to be seen

## 2017-07-25 NOTE — Telephone Encounter (Signed)
Please advise 

## 2017-07-26 NOTE — Telephone Encounter (Signed)
appt made for 07/29/17

## 2017-07-26 NOTE — Telephone Encounter (Signed)
NA, no Vm

## 2017-07-29 ENCOUNTER — Encounter: Payer: Self-pay | Admitting: Family

## 2017-07-29 ENCOUNTER — Ambulatory Visit (INDEPENDENT_AMBULATORY_CARE_PROVIDER_SITE_OTHER): Payer: Medicare Other | Admitting: Family

## 2017-07-29 VITALS — BP 128/85 | HR 118 | Temp 97.2°F | Ht 66.0 in | Wt 224.0 lb

## 2017-07-29 DIAGNOSIS — M79601 Pain in right arm: Secondary | ICD-10-CM

## 2017-07-29 DIAGNOSIS — M67441 Ganglion, right hand: Secondary | ICD-10-CM | POA: Diagnosis not present

## 2017-07-29 MED ORDER — GABAPENTIN 300 MG PO CAPS: 300.0000 mg | ORAL_CAPSULE | Freq: Three times a day (TID) | ORAL | 3 refills | Status: DC

## 2017-07-29 MED ORDER — TRAMADOL HCL 50 MG PO TABS: 50.0000 mg | ORAL_TABLET | Freq: Four times a day (QID) | ORAL | 0 refills | Status: DC | PRN

## 2017-07-29 NOTE — Patient Instructions (Signed)
Ganglion Cyst A ganglion cyst is a noncancerous, fluid-filled lump that occurs near joints or tendons. The ganglion cyst grows out of a joint or the lining of a tendon. It most often develops in the hand or wrist, but it can also develop in the shoulder, elbow, hip, knee, ankle, or foot. The round or oval ganglion cyst can be the size of a pea or larger than a grape. Increased activity may enlarge the size of the cyst because more fluid starts to build up. What are the causes? It is not known what causes a ganglion cyst to grow. However, it may be related to:  Inflammation or irritation around the joint.  An injury.  Repetitive movements or overuse.  Arthritis.  What increases the risk? Risk factors include:  Being a woman.  Being age 20-50.  What are the signs or symptoms? Symptoms may include:  A lump. This most often appears on the hand or wrist, but it can occur in other areas of the body.  Tingling.  Pain.  Numbness.  Muscle weakness.  Weak grip.  Less movement in a joint.  How is this diagnosed? Ganglion cysts are most often diagnosed based on a physical exam. Your health care provider will feel the lump and may shine a light alongside it. If it is a ganglion cyst, a light often shines through it. Your health care provider may order an X-ray, ultrasound, or MRI to rule out other conditions. How is this treated? Ganglion cysts usually go away on their own without treatment. If pain or other symptoms are involved, treatment may be needed. Treatment is also needed if the ganglion cyst limits your movement or if it gets infected. Treatment may include:  Wearing a brace or splint on your wrist or finger.  Taking anti-inflammatory medicine.  Draining fluid from the lump with a needle (aspiration).  Injecting a steroid into the joint.  Surgery to remove the ganglion cyst.  Follow these instructions at home:  Do not press on the ganglion cyst, poke it with a  needle, or hit it.  Take medicines only as directed by your health care provider.  Wear your brace or splint as directed by your health care provider.  Watch your ganglion cyst for any changes.  Keep all follow-up visits as directed by your health care provider. This is important. Contact a health care provider if:  Your ganglion cyst becomes larger or more painful.  You have increased redness, red streaks, or swelling.  You have pus coming from the lump.  You have weakness or numbness in the affected area.  You have a fever or chills. This information is not intended to replace advice given to you by your health care provider. Make sure you discuss any questions you have with your health care provider. Document Released: 04/27/2000 Document Revised: 10/06/2015 Document Reviewed: 10/13/2013 Elsevier Interactive Patient Education  2018 [employer]  

## 2017-07-29 NOTE — Progress Notes (Signed)
   Subjective:    Patient ID: Brittany Durham, female    DOB: 30-Apr-1971, 47 y.o.   MRN: [id number]   HPI Pt presents to the office today with right pinky finger pain that radiates up her arm. Pt had a negative x-ray and was told she had a ganglion cyst.   She was referred to orthopedic and had an appt last week, but missed that appt. States she was driving to the appt, but had the wrong address and showed up past her appt time.   PT has another appt with Ortho on 08/01/17.    She reports constant sharp pain of 8-9 out 10. States she has tried tylenol with mild relief.    Review of Systems  Musculoskeletal:       Right arm pain   All other systems reviewed and are negative.      Objective:   Physical Exam  Constitutional: She is oriented to person, place, and time. She appears well-developed and well-nourished. No distress.  HENT:  Head: Normocephalic.  Eyes: Pupils are equal, round, and reactive to light.  Neck: Normal range of motion. Neck supple. No thyromegaly present.  Cardiovascular: Normal rate, regular rhythm, normal heart sounds and intact distal pulses.  No murmur heard. Pulmonary/Chest: Effort normal and breath sounds normal. No respiratory distress. She has no wheezes.  Abdominal: Soft. Bowel sounds are normal. She exhibits no distension. There is no tenderness.  Musculoskeletal: Normal range of motion. She exhibits no edema or tenderness.  Neurological: She is alert and oriented to person, place, and time.  Skin: Skin is warm and dry.  Psychiatric: She has a normal mood and affect. Her behavior is normal. Judgment and thought content normal.  Vitals reviewed.     BP 128/85   Pulse (!) 118   Temp (!) 97.2 F (36.2 C) (Oral)   Ht 5\' 6"  (1.676 m)   Wt 224 lb (101.6 kg)   BMI 36.15 kg/m      Assessment & Plan:  1. Ganglion cyst of finger of right hand - traMADol (ULTRAM) 50 MG tablet; Take 1 tablet (50 mg total) by mouth every 6 (six) hours as needed.   Dispense: 90 tablet; Refill: 0  2. Pain of right upper extremity - traMADol (ULTRAM) 50 MG tablet; Take 1 tablet (50 mg total) by mouth every 6 (six) hours as needed.  Dispense: 90 tablet; Refill: 0 - gabapentin (NEURONTIN) 300 MG capsule; Take 1 capsule (300 mg total) by mouth 3 (three) times daily.  Dispense: 90 capsule; Refill: 3  Keep Ortho appt Will start gabapentin for nerve pain and Ultram as needed Discussed we can not continue Ultram for long term Take tylenol only as prescribed   Evelina Dun, FNP

## 2017-08-01 ENCOUNTER — Ambulatory Visit (INDEPENDENT_AMBULATORY_CARE_PROVIDER_SITE_OTHER): Payer: Medicare Other | Admitting: Orthopaedic Surgery

## 2017-08-01 ENCOUNTER — Encounter (INDEPENDENT_AMBULATORY_CARE_PROVIDER_SITE_OTHER): Payer: Self-pay | Admitting: Orthopaedic Surgery

## 2017-08-01 VITALS — BP 157/101 | HR 98 | Ht 66.0 in | Wt 221.0 lb

## 2017-08-01 DIAGNOSIS — R2231 Localized swelling, mass and lump, right upper limb: Secondary | ICD-10-CM

## 2017-08-01 MED ORDER — LIDOCAINE HCL 1 % IJ SOLN
0.3000 mL | INTRAMUSCULAR | Status: AC | PRN
  Administered 2017-08-01: .3 mL

## 2017-08-01 NOTE — Progress Notes (Signed)
Office Visit Note   Patient: Brittany Durham           Date of Birth: 29-Mar-1971           MRN: [id number] Visit Date: 08/01/2017              Requested by: Chevis Pretty, [place], [place] [address] PCP: Sharion Balloon, FNP   Assessment & Plan: Visit Diagnoses:  1. Finger mass, right     Plan: Aspiration revealed 1 cc of bloody fluid.  No ganglion fluid was aspirated.  Lidocaine wheal was used starting distally.  She got relief after the aspiration.  May be a hemangioma.  I will recheck her again in 3 weeks.  Follow-Up Instructions: Return in about 3 weeks (around 08/22/2017).   Orders:  Orders Placed This Encounter  Procedures  . Hand/UE Inj   No orders of the defined types were placed in this encounter.     Procedures: Hand/UE Inj on 08/01/2017 4:59 PM Indications: tendon swelling Medications: 0.3 mL lidocaine 1 % Aspirate: 1 mL bloody      Clinical Data: No additional findings.   Subjective: Chief Complaint  Patient presents with  . Right Little Finger - Pain    HPI 47 year old female here with her mother with a right fifth finger mass she noted one morning several weeks ago.  She is had pain with flexion pain with palpation.  She does not recall any specific trauma.  Some tramadol at night.  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  She is right-hand dominant.  Review of Systems significant for history of previous knee arthroscopy 2006 passed out several times and has had persistent speech problems since that time with expressive  aphasia.  Also positive for hypertension syncope GERD vitamin D deficiency her mental status, seizures thyroid nodule tachycardia peptic ulcer disease.  Otherwise negative as it pertains HPI.  No history of malignancy.   Objective: Vital Signs: BP (!) 157/101   Pulse 98   Ht 5\' 6"  (1.676 m)   Wt 221 lb (100.2 kg)   BMI 35.67 kg/m   Physical Exam  Constitutional: She is oriented to person, place, and time. She  appears well-developed.  Speech difficulty with expressive aphasia.  HENT:  Head: Normocephalic.  Right Ear: External ear normal.  Left Ear: External ear normal.  Eyes: Pupils are equal, round, and reactive to light.  Neck: No tracheal deviation present. No thyromegaly present.  Cardiovascular: Normal rate.  Pulmonary/Chest: Effort normal.  Abdominal: Soft.  Neurological: She is alert and oriented to person, place, and time.  Skin: Skin is warm and dry.  Psychiatric: She has a normal mood and affect. Her behavior is normal.    Ortho Exam no thenar or hyperthenar atrophy.  She has a tender palpable mobile mass appears to be fluid-filled between the distal and middle phalanx crease along the radial aspect of the right small  finger.  Mass is tender.  No splinter is palpable.  Flexor profundus function is intact.  Increased pain with finger flexion.  Sensation of the fingertip is intact.  Good capillary refill.  Extensor function is normal.  Collateral ligaments are stable.  Sublimis is active profundus is active.  The rest of the hand exam is normal.  Specialty Comments:  No specialty comments available.  Imaging: No results found.   PMFS History: Patient Active Problem List   Diagnosis Date Noted  . Morbid obesity ([place]) 04/30/2017  . Language difficulty 09/25/2016  .  Nausea with vomiting 07/18/2016  . PUD (peptic ulcer disease) 07/18/2016  . Slurred speech   . GI bleed 05/02/2016  . Upper GI bleed 05/02/2016  . Sinus tachycardia 05/02/2016  . Acute blood loss anemia 05/02/2016  . Abdominal pain, epigastric 05/02/2016  . 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 Thyroid nodule 12/14/2014  . Left knee pain 12/14/2014  . Seizures ([place])   . Altered mental status 12/12/2014  . Vitamin D deficiency 06/30/2014  . GERD (gastroesophageal reflux disease) 06/29/2014  . SYNCOPE 03/02/2010  . HYPERTENSION, BENIGN 03/01/2010   Past Medical History:  Diagnosis Date  . Hypertension   . Knee  pain    left meniscus tear s/p repair 11/2014  . Seizures ([place])   . Stuttering    patient reports this started after her knee surgery in 11/2014.   Marland Kitchen Thyroid nodule     Family History  Problem Relation Age of Onset  . Hypertension Mother   . Hypertension Brother   . Colon cancer Neg Hx     Past Surgical History:  Procedure Laterality Date  . COLONOSCOPY N/A 08/23/2016   Procedure: COLONOSCOPY;  Surgeon: Daneil Dolin, MD;  [facility];  Service: Endoscopy;  Laterality: N/A;  1:00pm  . ESOPHAGOGASTRODUODENOSCOPY N/A 05/03/2016   Procedure: ESOPHAGOGASTRODUODENOSCOPY (EGD);  Surgeon: Daneil Dolin, MD;  [facility];  Service: Endoscopy;  Laterality: N/A;  . ESOPHAGOGASTRODUODENOSCOPY N/A 08/23/2016   Procedure: ESOPHAGOGASTRODUODENOSCOPY (EGD);  Surgeon: Daneil Dolin, MD;  [facility];  Service: Endoscopy;  Laterality: N/A;  . KNEE ARTHROSCOPY WITH MEDIAL MENISECTOMY Left 12/09/2014   Procedure: KNEE ARTHROSCOPY WITH PARTIAL MEDIAL AND LATERAL MENISECTOMY;  Surgeon: Sanjuana Kava, MD;  Location: [facility];  Service: Orthopedics;  Laterality: Left;  . TUBAL LIGATION    . WISDOM TOOTH EXTRACTION     Social History   Occupational History  . Occupation: Umemployed  Tobacco Use  . Smoking status: Never Smoker  . Smokeless tobacco: Never Used  Substance and Sexual Activity  . Alcohol use: No  . Drug use: No  . Sexual activity: Yes    Birth control/protection: Surgical

## 2017-08-14 ENCOUNTER — Encounter: Payer: Self-pay | Admitting: Internal Medicine

## 2017-08-18 ENCOUNTER — Emergency Department (HOSPITAL_COMMUNITY): Payer: Medicare Other

## 2017-08-18 ENCOUNTER — Encounter (HOSPITAL_COMMUNITY): Payer: Self-pay | Admitting: Emergency Medicine

## 2017-08-18 ENCOUNTER — Emergency Department (HOSPITAL_COMMUNITY)
Admission: EM | Admit: 2017-08-18 | Discharge: 2017-08-18 | Disposition: A | Discharge status: Home / Self Care | Payer: Medicare Other | Attending: Emergency Medicine | Admitting: Emergency Medicine

## 2017-08-18 ENCOUNTER — Other Ambulatory Visit: Payer: Self-pay

## 2017-08-18 DIAGNOSIS — I10 Essential (primary) hypertension: Secondary | ICD-10-CM | POA: Diagnosis not present

## 2017-08-18 DIAGNOSIS — S8991XA Unspecified injury of right lower leg, initial encounter: Secondary | ICD-10-CM | POA: Diagnosis not present

## 2017-08-18 DIAGNOSIS — M25561 Pain in right knee: Secondary | ICD-10-CM | POA: Diagnosis not present

## 2017-08-18 DIAGNOSIS — Z79899 Other long term (current) drug therapy: Secondary | ICD-10-CM | POA: Insufficient documentation

## 2017-08-18 DIAGNOSIS — M7989 Other specified soft tissue disorders: Secondary | ICD-10-CM | POA: Diagnosis not present

## 2017-08-18 MED ORDER — HYDROCODONE-ACETAMINOPHEN 5-325 MG PO TABS: ORAL_TABLET | ORAL | 0 refills | Status: DC

## 2017-08-18 MED ORDER — HYDROCODONE-ACETAMINOPHEN 5-325 MG PO TABS
1.0000 | ORAL_TABLET | Freq: Once | ORAL | Status: AC
  Administered 2017-08-18: 1 via ORAL
  Filled 2017-08-18: qty 1

## 2017-08-18 NOTE — Discharge Instructions (Addendum)
Apply ice packs on/off to your knee.  Wear the brace as needed for support, but do not wear it continuously.  Call Dr. Ruthe Mannan office to arrange a follow-up appt in one week

## 2017-08-18 NOTE — ED Provider Notes (Signed)
[facility] EMERGENCY DEPARTMENT Provider Note   CSN: [id number] Arrival date & time: 08/18/17  1603     History   Chief Complaint Chief Complaint  Patient presents with  . Knee Pain    HPI Brittany Durham is a 47 y.o. female.  HPI   Brittany Durham is a 47 y.o. female who presents to the Emergency Department complaining of gradual worsening of pain to her right knee for 4 days.  Pain to her knee initially began in December after falling in the snow.  She was seen here at that time and XR's were negative for fracture.  She states that her knee gradually began hurting and has continued to increase in severity.  She has intermittently applied ice and taken Tylenol and gabapentin without relief.  No recent injury.  She states she has a history of a meniscal tear to the left knee and typically supports more weight on her right knee.  She denies numbness of the lower extremities, calf pain or swelling, fever, chills, or redness of the joint.   Past Medical History:  Diagnosis Date  . Hypertension   . Knee pain    left meniscus tear s/p repair 11/2014  . Seizures ([place])   . Stuttering    patient reports this started after her knee surgery in 11/2014.   Marland Kitchen Thyroid nodule     Patient Active Problem List   Diagnosis Date Noted  . Morbid obesity ([place]) 04/30/2017  . Language difficulty 09/25/2016  . Nausea with vomiting 07/18/2016  . PUD (peptic ulcer disease) 07/18/2016  . Slurred speech   . GI bleed 05/02/2016  . Upper GI bleed 05/02/2016  . Sinus tachycardia 05/02/2016  . Acute blood loss anemia 05/02/2016  . Abdominal pain, epigastric 05/02/2016  . 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 Thyroid nodule 12/14/2014  . Left knee pain 12/14/2014  . Seizures ([place])   . Altered mental status 12/12/2014  . Vitamin D deficiency 06/30/2014  . GERD (gastroesophageal reflux disease) 06/29/2014  . SYNCOPE 03/02/2010  . HYPERTENSION, BENIGN 03/01/2010    Past Surgical History:  Procedure  Laterality Date  . COLONOSCOPY N/A 08/23/2016   Procedure: COLONOSCOPY;  Surgeon: Daneil Dolin, MD;  [facility];  Service: Endoscopy;  Laterality: N/A;  1:00pm  . ESOPHAGOGASTRODUODENOSCOPY N/A 05/03/2016   Procedure: ESOPHAGOGASTRODUODENOSCOPY (EGD);  Surgeon: Daneil Dolin, MD;  [facility];  Service: Endoscopy;  Laterality: N/A;  . ESOPHAGOGASTRODUODENOSCOPY N/A 08/23/2016   Procedure: ESOPHAGOGASTRODUODENOSCOPY (EGD);  Surgeon: Daneil Dolin, MD;  [facility];  Service: Endoscopy;  Laterality: N/A;  . KNEE ARTHROSCOPY WITH MEDIAL MENISECTOMY Left 12/09/2014   Procedure: KNEE ARTHROSCOPY WITH PARTIAL MEDIAL AND LATERAL MENISECTOMY;  Surgeon: Sanjuana Kava, MD;  Location: [facility];  Service: Orthopedics;  Laterality: Left;  . TUBAL LIGATION    . WISDOM TOOTH EXTRACTION       OB History    Gravida  1   Para  1   Term  1   Preterm      AB      Living  1     SAB      TAB      Ectopic      Multiple      Live Births               Home Medications    Prior to Admission medications   Medication Sig Start Date End Date Taking? Authorizing Provider  amLODipine (NORVASC) 10  MG tablet Take 1 tablet (10 mg total) by mouth daily. 04/30/17   Sharion Balloon, FNP  gabapentin (NEURONTIN) 300 MG capsule Take 1 capsule (300 mg total) by mouth 3 (three) times daily. 07/29/17   Sharion Balloon, FNP  lisinopril (PRINIVIL,ZESTRIL) 20 MG tablet TAKE 1 TABLET DAILY 05/01/17   Evelina Dun A, FNP  pantoprazole (PROTONIX) 40 MG tablet Take 1 tablet by mouth 2 (two) times daily before a meal. 02/18/17   Carlis Stable, NP  traMADol (ULTRAM) 50 MG tablet Take 1 tablet (50 mg total) by mouth every 6 (six) hours as needed. 07/29/17   Sharion Balloon, FNP  Vitamin D, Ergocalciferol, (DRISDOL) 50000 units CAPS capsule TAKE 1 CAPSULE ONCE A WEEK 06/18/17   Sharion Balloon, FNP    Family History Family History  Problem Relation Age of Onset  . Hypertension  Mother   . Hypertension Brother   . Colon cancer Neg Hx     Social History Social History   Tobacco Use  . Smoking status: Never Smoker  . Smokeless tobacco: Never Used  Substance Use Topics  . Alcohol use: No  . Drug use: No     Allergies   Patient has no known allergies.   Review of Systems Review of Systems  Constitutional: Negative for chills and fever.  Musculoskeletal: Positive for arthralgias (right knee pain). Negative for joint swelling.  Skin: Negative for color change and wound.  Neurological: Negative for weakness and numbness.     Physical Exam Updated Vital Signs BP (!) 141/90 (BP Location: Right Arm)   Pulse (!) 106   Temp 98 F (36.7 C) (Oral)   Resp 16   Ht 5\' 7"  (1.702 m)   Wt 101.6 kg (224 lb)   SpO2 97%   BMI 35.08 kg/m   Physical Exam  Constitutional: She is oriented to person, place, and time. She appears well-developed and well-nourished. No distress.  Cardiovascular: Normal rate, regular rhythm and intact distal pulses.  Pulmonary/Chest: Effort normal and breath sounds normal.  Musculoskeletal: She exhibits tenderness. She exhibits no deformity.  Diffuse ttp of the anterior right knee.  Pain reproduced on ROM. Mild edema noted.  No step off deformities.  No pain or edema of the lower extremity.    Neurological: She is alert and oriented to person, place, and time. No sensory deficit. She exhibits normal muscle tone. Coordination normal.  Skin: Skin is warm and dry. Capillary refill takes less than 2 seconds. No erythema.  Nursing note and vitals reviewed.    ED Treatments / Results  Labs (all labs ordered are listed, but only abnormal results are displayed) Labs Reviewed - No data to display  EKG None  Radiology Dg Knee Complete 4 Views Right  Result Date: 08/18/2017 CLINICAL DATA:  Pain and swelling of the right knee. EXAM: RIGHT KNEE - COMPLETE 4+ VIEW COMPARISON:  April 27, 2017 FINDINGS: No evidence of fracture,  dislocation, or joint effusion. No evidence of arthropathy or other focal bone abnormality. Soft tissues are unremarkable. IMPRESSION: Negative. Electronically Signed   By: Abelardo Diesel M.D.   On: 08/18/2017 17:19    Procedures Procedures (including critical care time)  Medications Ordered in ED Medications  HYDROcodone-acetaminophen (NORCO/VICODIN) 5-325 MG per tablet 1 tablet (has no administration in time range)     Initial Impression / Assessment and Plan / ED Course  I have reviewed the triage vital signs and the nursing notes.  Pertinent labs & imaging results  that were available during my care of the patient were reviewed by me and considered in my medical decision making (see chart for details).     Pt with acute on chronic right knee pain.  XR today is neg for bony injury and effusion.  No concerning sx's for septic joint, sx's felt to be inflammatory.  She cannot take NSAIDs due to hx of GI bleed.   Pt agrees to tx plan with ice, knee sleeve of support, and close orthopedic f/u.  Pt has crutches if needed, Short course of vicodin, database reviewed, no excessive rx's  Final Clinical Impressions(s) / ED Diagnoses   Final diagnoses:  Acute pain of right knee    ED Discharge Orders    None       Kem Parkinson, PA-C 08/18/17 1911    Hayden Rasmussen, MD 08/19/17 1229

## 2017-08-18 NOTE — ED Triage Notes (Signed)
Pt states that her right knee is hurting.

## 2017-08-22 ENCOUNTER — Encounter: Payer: Self-pay | Admitting: *Deleted

## 2017-08-22 ENCOUNTER — Ambulatory Visit (INDEPENDENT_AMBULATORY_CARE_PROVIDER_SITE_OTHER): Payer: Medicare Other | Admitting: Orthopaedic Surgery

## 2017-08-22 ENCOUNTER — Encounter (INDEPENDENT_AMBULATORY_CARE_PROVIDER_SITE_OTHER): Payer: Self-pay | Admitting: Orthopaedic Surgery

## 2017-08-22 VITALS — BP 135/95 | HR 101

## 2017-08-22 DIAGNOSIS — M2241 Chondromalacia patellae, right knee: Secondary | ICD-10-CM | POA: Diagnosis not present

## 2017-08-22 NOTE — Progress Notes (Signed)
Office Visit Note   Patient: Brittany Durham           Date of Birth: [birth year]           MRN: [id number] Visit Date: 08/22/2017              Requested by: Sharion Balloon, [address] [place], [place] [address] PCP: Sharion Balloon, FNP   Assessment & Plan: Visit Diagnoses:  1. Chondromalacia patellae, right knee     Plan: Persistent symptoms we can consider diagnostic MRI scan.  Her problem appears to be chondromalacia patella with acute symptoms.  Follow-Up Instructions: No follow-ups on file.   Orders:  Orders Placed This Encounter  Procedures  . Large Joint Inj   No orders of the defined types were placed in this encounter.     Procedures: Large Joint Inj: R knee on 08/22/2017 4:13 PM Indications: pain and joint swelling Details: 22 G 1.5 in needle, anterolateral approach  Arthrogram: No  Medications: 40 mg methylPREDNISolone acetate 40 MG/ML; 0.5 mL lidocaine 1 %; 4 mL bupivacaine 0.25 % Outcome: tolerated well, no immediate complications Procedure, treatment alternatives, risks and benefits explained, specific risks discussed. Consent was given by the patient. Immediately prior to procedure a time out was called to verify the correct patient, procedure, equipment, support staff and site/side marked as required. Patient was prepped and draped in the usual sterile fashion.       Clinical Data: No additional findings.   Subjective: Chief Complaint  Patient presents with  . Right Little Finger - Follow-up    HPI 47 year old female returns with problems with right knee pain which is a new problem.  8 days ago she was seen in the emergency room on 08/18/2017 in swelling in her knee.  Is a past history of meniscal tear of her left knee and putting more weight on her right knee.  It bothers her at night problems with stairs with assisted knee extension.  Previously seen for a mass the right finger aspirated 08/01/2017 possibly hemangioma but only blood was  aspirated and she is had gradual resolution of the mass post aspiration.  She states her fingers doing well but her knee is giving her lots of problems.  Requesting an injection today.  X-rays in the emergency room were negative for acute changes.  Review of Systems 14 point review of systems updated unchanged from 08/01/2017.   Objective: Vital Signs: BP (!) 135/95   Pulse (!) 101   Physical Exam  Constitutional: She is oriented to person, place, and time. She appears well-developed.  HENT:  Head: Normocephalic.  Right Ear: External ear normal.  Left Ear: External ear normal.  Eyes: Pupils are equal, round, and reactive to light.  Neck: No tracheal deviation present. No thyromegaly present.  Cardiovascular: Normal rate.  Pulmonary/Chest: Effort normal.  Abdominal: Soft.  Neurological: She is alert and oriented to person, place, and time.  Skin: Skin is warm and dry.  Psychiatric: She has a normal mood and affect. Her behavior is normal.    Ortho Exam shows resolution from previous exam 08/01/2017.  Good flexion-extension no tenderness.  Well-healed left knee arthroscopic portals no effusion of the left knee.  No pain with right or left hip range of motion negative straight leg raising 90 degrees no popliteal mass.  There is 2+ knee effusion right knee.  Pain with patellar loading and with resisted extension with crepitus worse on the right knee and the left knee.  Positive patellar grind test.  No medial lateral joint line tenderness.  ACL PCL collateral ligaments exam is normal.  Distal pulses are intact she has full extension flexion 120 degrees by knee effusion and pain.  Specialty Comments:  No specialty comments available.  Imaging: No results found.   PMFS History: Patient Active Problem List   Diagnosis Date Noted  . Morbid obesity ([place]) 04/30/2017  . Language difficulty 09/25/2016  . Nausea with vomiting 07/18/2016  . PUD (peptic ulcer disease) 07/18/2016  . Slurred  speech   . GI bleed 05/02/2016  . Upper GI bleed 05/02/2016  . Sinus tachycardia 05/02/2016  . Acute blood loss anemia 05/02/2016  . Abdominal pain, epigastric 05/02/2016  . 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 Thyroid nodule 12/14/2014  . Left knee pain 12/14/2014  . Seizures ([place])   . Altered mental status 12/12/2014  . Vitamin D deficiency 06/30/2014  . GERD (gastroesophageal reflux disease) 06/29/2014  . SYNCOPE 03/02/2010  . HYPERTENSION, BENIGN 03/01/2010   Past Medical History:  Diagnosis Date  . Hypertension   . Knee pain    left meniscus tear s/p repair 11/2014  . Seizures ([place])   . Stuttering    patient reports this started after her knee surgery in 11/2014.   Marland Kitchen Thyroid nodule     Family History  Problem Relation Age of Onset  . Hypertension Mother   . Hypertension Brother   . Colon cancer Neg Hx     Past Surgical History:  Procedure Laterality Date  . COLONOSCOPY N/A 08/23/2016   Procedure: COLONOSCOPY;  Surgeon: Daneil Dolin, MD;  [facility];  Service: Endoscopy;  Laterality: N/A;  1:00pm  . ESOPHAGOGASTRODUODENOSCOPY N/A 05/03/2016   Procedure: ESOPHAGOGASTRODUODENOSCOPY (EGD);  Surgeon: Daneil Dolin, MD;  [facility];  Service: Endoscopy;  Laterality: N/A;  . ESOPHAGOGASTRODUODENOSCOPY N/A 08/23/2016   Procedure: ESOPHAGOGASTRODUODENOSCOPY (EGD);  Surgeon: Daneil Dolin, MD;  [facility];  Service: Endoscopy;  Laterality: N/A;  . KNEE ARTHROSCOPY WITH MEDIAL MENISECTOMY Left 12/09/2014   Procedure: KNEE ARTHROSCOPY WITH PARTIAL MEDIAL AND LATERAL MENISECTOMY;  Surgeon: Sanjuana Kava, MD;  Location: [facility];  Service: Orthopedics;  Laterality: Left;  . TUBAL LIGATION    . WISDOM TOOTH EXTRACTION     Social History   Occupational History  . Occupation: Umemployed  Tobacco Use  . Smoking status: Never Smoker  . Smokeless tobacco: Never Used  Substance and Sexual Activity  . Alcohol use: No  . Drug use: No  . Sexual  activity: Yes    Birth control/protection: Surgical

## 2017-08-25 ENCOUNTER — Encounter (INDEPENDENT_AMBULATORY_CARE_PROVIDER_SITE_OTHER): Payer: Self-pay | Admitting: Orthopaedic Surgery

## 2017-08-25 MED ORDER — BUPIVACAINE HCL 0.25 % IJ SOLN
4.0000 mL | INTRAMUSCULAR | Status: AC | PRN
  Administered 2017-08-22: 4 mL via INTRA_ARTICULAR

## 2017-08-25 MED ORDER — METHYLPREDNISOLONE ACETATE 40 MG/ML IJ SUSP
40.0000 mg | INTRAMUSCULAR | Status: AC | PRN
  Administered 2017-08-22: 40 mg via INTRA_ARTICULAR

## 2017-08-25 MED ORDER — LIDOCAINE HCL 1 % IJ SOLN
0.5000 mL | INTRAMUSCULAR | Status: AC | PRN
Start: 2017-08-22 — End: 2017-08-22
  Administered 2017-08-22: .5 mL

## 2017-09-11 ENCOUNTER — Ambulatory Visit: Payer: Medicare Other | Admitting: Orthopedic Surgery

## 2017-09-19 ENCOUNTER — Ambulatory Visit (INDEPENDENT_AMBULATORY_CARE_PROVIDER_SITE_OTHER): Payer: Medicare Other | Admitting: Orthopaedic Surgery

## 2017-09-19 ENCOUNTER — Encounter (INDEPENDENT_AMBULATORY_CARE_PROVIDER_SITE_OTHER): Payer: Self-pay | Admitting: Orthopaedic Surgery

## 2017-09-19 VITALS — BP 145/93 | HR 124 | Ht 67.0 in | Wt 220.0 lb

## 2017-09-19 DIAGNOSIS — M2241 Chondromalacia patellae, right knee: Secondary | ICD-10-CM

## 2017-09-19 DIAGNOSIS — M25561 Pain in right knee: Secondary | ICD-10-CM | POA: Diagnosis not present

## 2017-09-19 DIAGNOSIS — R2231 Localized swelling, mass and lump, right upper limb: Secondary | ICD-10-CM

## 2017-09-19 MED ORDER — DIAZEPAM 5 MG PO TABS: ORAL_TABLET | ORAL | 0 refills | Status: DC

## 2017-09-19 NOTE — Progress Notes (Signed)
Office Visit Note   Patient: Brittany Durham           Date of Birth: 06/13/1970           MRN: [id number] Visit Date: 09/19/2017              Requested by: Sharion Balloon, [address] [place], [place] [address] PCP: Sharion Balloon, FNP   Assessment & Plan: Visit Diagnoses:  1. Chondromalacia patellae, right knee   2. Acute pain of right knee   3. Mass of finger of right hand     Plan: Patient like to proceed with MRI scan of her right knee repetitive locking of her knee.  She has crepitus with knee extension has had giving way in her knee.  Failed to respond to anti-inflammatories as well as intra-articular injection April.  Office follow-up after MRI scan.  Follow-Up Instructions: No follow-ups on file.   Orders:  Orders Placed This Encounter  Procedures  . MR Knee Right w/o contrast   Meds ordered this encounter  Medications  . diazepam (VALIUM) 5 MG tablet    Sig: Take 2 tablets one hour prior to MRI. Take the other tablet with you.    Dispense:  3 tablet    Refill:  0      Procedures: No procedures performed   Clinical Data: No additional findings.   Subjective: Chief Complaint  Patient presents with  . Right Knee - Pain    HPI 47 year old female returns the right fifth finger.  Small cyst adjacent to the PIP joint on the radial side that was aspirated with slight bloody fluid.  Smaller in size still aches some.  She is had persistent problems with her right knee and the injection 4/11 states really did not give her much improvement and she is continuing to limp and have pain.  No fever or chills.  She has difficulty with speech which developed all days after arthroscopy of her left knee done in [facility].  Patient is here with her daughter.  Ambulating with a stiff knee limp.  Review of Systems unchanged from 08/01/2017 office visit.  Previous normal  Brain MRI.     Objective: Vital Signs: BP (!) 145/93   Pulse (!) 124   Ht 5'  7" (1.702 m)   Wt 220 lb (99.8 kg)   BMI 34.46 kg/m   Physical Exam  Constitutional: She is oriented to person, place, and time. She appears well-developed.  HENT:  Head: Normocephalic.  Right Ear: External ear normal.  Left Ear: External ear normal.  Eyes: Pupils are equal, round, and reactive to light.  Neck: No tracheal deviation present. No thyromegaly present.  Cardiovascular: Normal rate.  Pulmonary/Chest: Effort normal.  Abdominal: Soft.  Neurological: She is alert and oriented to person, place, and time.  Skin: Skin is warm and dry.  Psychiatric: She has a normal mood and affect. Her behavior is normal.    Ortho Exam patient is amatory with a right knee limp.  Is crepitus with knee extension also some in the opposite left knee.  Collateral ligaments are stable ACL exam straight negative Lockman negative pivot shift.  No pain with hip range of motion.  No palpable popliteal masses.  Distal pulses are intact.  Right finger, fifth finger mass suggesting the PIP joint is 1 to 2 mm in size slightly tender.  Sensation of the fingertip is intact.  Flexor and extensor function is intact.  Specialty Comments:  No specialty comments available.  Imaging: No results found.   PMFS History: Patient Active Problem List   Diagnosis Date Noted  . 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 Morbid obesity ([place]) 04/30/2017  . Language difficulty 09/25/2016  . Nausea with vomiting 07/18/2016  . PUD (peptic ulcer disease) 07/18/2016  . Slurred speech   . GI bleed 05/02/2016  . Upper GI bleed 05/02/2016  . Sinus tachycardia 05/02/2016  . Acute blood loss anemia 05/02/2016  . Abdominal pain, epigastric 05/02/2016  . 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 Thyroid nodule 12/14/2014  . Left knee pain 12/14/2014  . Seizures ([place])   . Altered mental status 12/12/2014  . Vitamin D deficiency 06/30/2014  . GERD (gastroesophageal reflux disease) 06/29/2014  . SYNCOPE 03/02/2010  .  HYPERTENSION, BENIGN 03/01/2010   Past Medical History:  Diagnosis Date  . Hypertension   . Knee pain    left meniscus tear s/p repair 11/2014  . Seizures ([place])   . Stuttering    patient reports this started after her knee surgery in 11/2014.   Marland Kitchen Thyroid nodule     Family History  Problem Relation Age of Onset  . Hypertension Mother   . Hypertension Brother   . Colon cancer Neg Hx     Past Surgical History:  Procedure Laterality Date  . COLONOSCOPY N/A 08/23/2016   Procedure: COLONOSCOPY;  Surgeon: Daneil Dolin, MD;  [facility];  Service: Endoscopy;  Laterality: N/A;  1:00pm  . ESOPHAGOGASTRODUODENOSCOPY N/A 05/03/2016   Procedure: ESOPHAGOGASTRODUODENOSCOPY (EGD);  Surgeon: Daneil Dolin, MD;  [facility];  Service: Endoscopy;  Laterality: N/A;  . ESOPHAGOGASTRODUODENOSCOPY N/A 08/23/2016   Procedure: ESOPHAGOGASTRODUODENOSCOPY (EGD);  Surgeon: Daneil Dolin, MD;  [facility];  Service: Endoscopy;  Laterality: N/A;  . KNEE ARTHROSCOPY WITH MEDIAL MENISECTOMY Left 12/09/2014   Procedure: KNEE ARTHROSCOPY WITH PARTIAL MEDIAL AND LATERAL MENISECTOMY;  Surgeon: Sanjuana Kava, MD;  Location: [facility];  Service: Orthopedics;  Laterality: Left;  . TUBAL LIGATION    . WISDOM TOOTH EXTRACTION     Social History   Occupational History  . Occupation: Umemployed  Tobacco Use  . Smoking status: Never Smoker  . Smokeless tobacco: Never Used  Substance and Sexual Activity  . Alcohol use: No  . Drug use: No  . Sexual activity: Yes    Birth control/protection: Surgical

## 2017-09-20 ENCOUNTER — Encounter (INDEPENDENT_AMBULATORY_CARE_PROVIDER_SITE_OTHER): Payer: Self-pay | Admitting: Orthopaedic Surgery

## 2017-09-20 DIAGNOSIS — M2241 Chondromalacia patellae, right knee: Secondary | ICD-10-CM | POA: Insufficient documentation

## 2017-09-24 ENCOUNTER — Ambulatory Visit: Payer: Medicare Other | Admitting: Internal Medicine

## 2017-09-27 ENCOUNTER — Ambulatory Visit (HOSPITAL_COMMUNITY)
Admission: RE | Admit: 2017-09-27 | Discharge: 2017-09-27 | Disposition: A | Discharge status: Home / Self Care | Payer: Medicare Other | Source: Ambulatory Visit | Attending: Orthopaedic Surgery | Admitting: Orthopaedic Surgery

## 2017-09-27 DIAGNOSIS — X58XXXA Exposure to other specified factors, initial encounter: Secondary | ICD-10-CM | POA: Insufficient documentation

## 2017-09-27 DIAGNOSIS — M7121 Synovial cyst of popliteal space [Baker], right knee: Secondary | ICD-10-CM | POA: Diagnosis not present

## 2017-09-27 DIAGNOSIS — M948X6 Other specified disorders of cartilage, lower leg: Secondary | ICD-10-CM | POA: Diagnosis not present

## 2017-09-27 DIAGNOSIS — S83281A Other tear of lateral meniscus, current injury, right knee, initial encounter: Secondary | ICD-10-CM | POA: Diagnosis not present

## 2017-09-27 DIAGNOSIS — M25561 Pain in right knee: Secondary | ICD-10-CM | POA: Insufficient documentation

## 2017-09-27 DIAGNOSIS — R600 Localized edema: Secondary | ICD-10-CM | POA: Diagnosis not present

## 2017-09-27 DIAGNOSIS — M899 Disorder of bone, unspecified: Secondary | ICD-10-CM | POA: Insufficient documentation

## 2017-09-27 DIAGNOSIS — M2241 Chondromalacia patellae, right knee: Secondary | ICD-10-CM

## 2017-10-21 ENCOUNTER — Other Ambulatory Visit: Payer: Self-pay | Admitting: Family

## 2017-10-22 ENCOUNTER — Other Ambulatory Visit: Payer: Self-pay | Admitting: Family

## 2017-10-22 DIAGNOSIS — M79601 Pain in right arm: Secondary | ICD-10-CM

## 2017-10-22 DIAGNOSIS — M67441 Ganglion, right hand: Secondary | ICD-10-CM

## 2017-10-24 ENCOUNTER — Ambulatory Visit (INDEPENDENT_AMBULATORY_CARE_PROVIDER_SITE_OTHER): Payer: Medicare Other | Admitting: Orthopaedic Surgery

## 2017-10-24 ENCOUNTER — Encounter (INDEPENDENT_AMBULATORY_CARE_PROVIDER_SITE_OTHER): Payer: Self-pay | Admitting: Orthopaedic Surgery

## 2017-10-24 VITALS — BP 135/97 | HR 104 | Ht 67.0 in | Wt 220.0 lb

## 2017-10-24 DIAGNOSIS — M2241 Chondromalacia patellae, right knee: Secondary | ICD-10-CM | POA: Diagnosis not present

## 2017-10-24 DIAGNOSIS — M12261 Villonodular synovitis (pigmented), right knee: Secondary | ICD-10-CM

## 2017-10-24 DIAGNOSIS — S83281D Other tear of lateral meniscus, current injury, right knee, subsequent encounter: Secondary | ICD-10-CM

## 2017-10-24 NOTE — Progress Notes (Signed)
Office Visit Note   Patient: Brittany Durham           Date of Birth: 03/02/1971           MRN: [id number] Visit Date: 10/24/2017              Requested by: Sharion Balloon, [address] [place], [place] [address] PCP: Sharion Balloon, FNP   Assessment & Plan: Visit Diagnoses:  1. Synovitis, villonodular, knee, right   2. Chondromalacia patellae, right knee   3. Tear of lateral meniscus of right knee, current, unspecified tear type, subsequent encounter     Plan: Patient's MRI shows lateral meniscal tear.  High-grade partial-thickness loss of the patella medial facet.  There is nodular synovitis anteriorly where she is tender which measures 2.55 x 1.6 cm consistent with focal nodular synovitis.  She is failed anti-inflammatories intra-articular injection.  Plan would be knee arthroscopy for debridement of the local nodular synovitis.  Evaluation lateral meniscal tear and partial lateral meniscectomy if needed.  Plan procedure discussed and questions were elicited and answered.  Significant problems walking would like to proceed with arthroscopic treatment for her nodular synovitis.  Follow-Up Instructions: No follow-ups on file.   Orders:  No orders of the defined types were placed in this encounter.  No orders of the defined types were placed in this encounter.     Procedures: No procedures performed   Clinical Data: No additional findings.   Subjective: Chief Complaint  Patient presents with  . Right Knee - Pain, Follow-up    MRI review    HPI 47 year old female returns with ongoing pain in her right knee with persistent swelling anterior knee pain difficulty standing and walking and repetitive catching of her knee.  Has not fallen.  She has been taking Tylenol.  Finger cyst aspiration doing well.  Due to ongoing pain in her knee MRI scan has been obtained and is available for review.  Previous arthroscopy of her left knee and postoperatively she had fainting  episodes and then developed dysarthric problem with slurring of her speech and stammering at times.  Problems with difficulty communicating.  Pain in her right knee wakes her up at night.  Brain MRI and CT scan were negative.  EEG was normal.  She is having extreme difficulty ambulating with the right knee.  States her left knee is doing well after arthroscopy 2016.  Review of Systems 14 point review of systems updated unchanged from 08/01/2017.  Of note is positive for history for hypertension.  GERD, vitamin D deficiency.  Tachycardia ,peptic ulcers disease.   Objective: Vital Signs: BP (!) 135/97   Pulse (!) 104   Ht 5\' 7"  (1.702 m)   Wt 220 lb (99.8 kg)   BMI 34.46 kg/m   Physical Exam  Constitutional: She is oriented to person, place, and time. She appears well-developed.  HENT:  Head: Normocephalic.  Right Ear: External ear normal.  Left Ear: External ear normal.  Eyes: Pupils are equal, round, and reactive to light.  Neck: No tracheal deviation present. No thyromegaly present.  Cardiovascular: Normal rate.  Pulmonary/Chest: Effort normal.  Abdominal: Soft.  Neurological: She is alert and oriented to person, place, and time.  Skin: Skin is warm and dry.  Psychiatric: She has a normal mood and affect. Her behavior is normal.    Ortho Exam patient has pain with palpation adjacent to the patella.  Pain with patellar loading and quadriceps contracture.  There is 2+  knee synovitis.  Collateral cruciate ligament exam is normal. Specialty Comments:  No specialty comments available.  Imaging: CLINICAL DATA:  Diffuse right knee pain with mechanical symptoms.  EXAM: MRI OF THE RIGHT KNEE WITHOUT CONTRAST  TECHNIQUE: Multiplanar, multisequence MR imaging of the knee was performed. No intravenous contrast was administered.  COMPARISON:  Right knee x-rays dated August 18, 2017.  FINDINGS: MENISCI  Medial meniscus:  Intact.  Lateral meniscus: Degenerative signal within  the anterior horn with small radial tear.  LIGAMENTS  Cruciates:  Intact ACL and PCL.  Collaterals: Medial collateral ligament is intact. Lateral collateral ligament complex is intact.  CARTILAGE  Patellofemoral: High-grade partial-thickness cartilage loss over the medial patellar facet.  Medial:  No chondral defect.  Lateral: Mild partial-thickness cartilage loss over the mesial aspect of the lateral femoral condyle.  Joint: Trace joint effusion. Edema in the superolateral aspect of Hoffa's fat. 2.5 x 1.6 cm oval lesion with heterogeneous T2 signal intensity and hypointense T1 signal is noted within the joint along the anterior intercondylar notch extending into Hoffa's fat. No plical thickening.  Popliteal Fossa:  Small Baker cyst.  Intact popliteus tendon.  Extensor Mechanism: Intact quadriceps tendon and patellar tendon. Intact medial and lateral patellar retinaculum. Intact MPFL.  Bones: Lateral tilt and slight lateral subluxation of the patella. No acute fracture or dislocation. No focal bone lesion.  Other: None.  IMPRESSION: 1. Degenerative signal and small radial tear of the lateral meniscus anterior horn. 2. 2.5 x 1.6 cm heterogeneous lesion within the joint along the anterior intercondylar notch extending into Hoffa's fat may reflect focal nodular synovitis. 3. Focal abnormal edema in the superolateral aspect of Hoffa's fat pad, which can be seen in the setting of patellar maltracking. High-grade partial-thickness cartilage loss over the medial patellar facet. 4. Small Baker cyst.   Electronically Signed   By: Titus Dubin M.D.   On: 09/27/2017 16:15    PMFS History: Patient Active Problem List   Diagnosis Date Noted  . 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 Morbid obesity ([place]) 04/30/2017  . Language difficulty 09/25/2016  . Nausea with vomiting 07/18/2016  . PUD (peptic ulcer disease) 07/18/2016  . Slurred  speech   . GI bleed 05/02/2016  . Upper GI bleed 05/02/2016  . Sinus tachycardia 05/02/2016  . Acute blood loss anemia 05/02/2016  . Abdominal pain, epigastric 05/02/2016  . 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 Thyroid nodule 12/14/2014  . Left knee pain 12/14/2014  . Seizures ([place])   . Altered mental status 12/12/2014  . Vitamin D deficiency 06/30/2014  . GERD (gastroesophageal reflux disease) 06/29/2014  . SYNCOPE 03/02/2010  . HYPERTENSION, BENIGN 03/01/2010   Past Medical History:  Diagnosis Date  . Hypertension   . Knee pain    left meniscus tear s/p repair 11/2014  . Seizures ([place])   . Stuttering    patient reports this started after her knee surgery in 11/2014.   Marland Kitchen Thyroid nodule     Family History  Problem Relation Age of Onset  . Hypertension Mother   . Hypertension Brother   . Colon cancer Neg Hx     Past Surgical History:  Procedure Laterality Date  . COLONOSCOPY N/A 08/23/2016   Procedure: COLONOSCOPY;  Surgeon: Daneil Dolin, MD;  [facility];  Service: Endoscopy;  Laterality: N/A;  1:00pm  . ESOPHAGOGASTRODUODENOSCOPY N/A 05/03/2016   Procedure: ESOPHAGOGASTRODUODENOSCOPY (EGD);  Surgeon: Daneil Dolin, MD;  [facility]: [facility];  Service: Endoscopy;  Laterality:  N/A;  . ESOPHAGOGASTRODUODENOSCOPY N/A 08/23/2016   Procedure: ESOPHAGOGASTRODUODENOSCOPY (EGD);  Surgeon: Daneil Dolin, MD;  [facility];  Service: Endoscopy;  Laterality: N/A;  . KNEE ARTHROSCOPY WITH MEDIAL MENISECTOMY Left 12/09/2014   Procedure: KNEE ARTHROSCOPY WITH PARTIAL MEDIAL AND LATERAL MENISECTOMY;  Surgeon: Sanjuana Kava, MD;  Location: [facility];  Service: Orthopedics;  Laterality: Left;  . TUBAL LIGATION    . WISDOM TOOTH EXTRACTION     Social History   Occupational History  . Occupation: Umemployed  Tobacco Use  . Smoking status: Never Smoker  . Smokeless tobacco: Never Used  Substance and Sexual Activity  . Alcohol use: No  . Drug use: No  . Sexual  activity: Yes    Birth control/protection: Surgical

## 2017-10-28 ENCOUNTER — Encounter (INDEPENDENT_AMBULATORY_CARE_PROVIDER_SITE_OTHER): Payer: Self-pay | Admitting: Orthopaedic Surgery

## 2017-10-28 DIAGNOSIS — M12261 Villonodular synovitis (pigmented), right knee: Secondary | ICD-10-CM | POA: Insufficient documentation

## 2017-10-28 DIAGNOSIS — S83281A Other tear of lateral meniscus, current injury, right knee, initial encounter: Secondary | ICD-10-CM | POA: Insufficient documentation

## 2017-11-18 ENCOUNTER — Ambulatory Visit (INDEPENDENT_AMBULATORY_CARE_PROVIDER_SITE_OTHER): Payer: Medicare Other | Admitting: Family

## 2017-11-18 ENCOUNTER — Encounter: Payer: Self-pay | Admitting: Family

## 2017-11-18 VITALS — BP 133/85 | HR 99 | Temp 96.8°F | Ht 67.0 in | Wt 213.0 lb

## 2017-11-18 DIAGNOSIS — K219 Gastro-esophageal reflux disease without esophagitis: Secondary | ICD-10-CM

## 2017-11-18 DIAGNOSIS — M1711 Unilateral primary osteoarthritis, right knee: Secondary | ICD-10-CM | POA: Diagnosis not present

## 2017-11-18 DIAGNOSIS — F809 Developmental disorder of speech and language, unspecified: Secondary | ICD-10-CM

## 2017-11-18 DIAGNOSIS — I10 Essential (primary) hypertension: Secondary | ICD-10-CM

## 2017-11-18 DIAGNOSIS — G4700 Insomnia, unspecified: Secondary | ICD-10-CM | POA: Diagnosis not present

## 2017-11-18 DIAGNOSIS — M1990 Unspecified osteoarthritis, unspecified site: Secondary | ICD-10-CM | POA: Insufficient documentation

## 2017-11-18 DIAGNOSIS — E559 Vitamin D deficiency, unspecified: Secondary | ICD-10-CM | POA: Diagnosis not present

## 2017-11-18 NOTE — Progress Notes (Signed)
Subjective:    Patient ID: Brittany Durham, female    DOB: 11-Jun-1970, 47 y.o.   MRN: [id number]  Chief Complaint  Patient presents with  . Medical Management of Chronic Issues    six month recheck   PT presents to the office today for chronic follow up. Pt is followed by Neurologists for Speech difficulties. Pt is scheduled for right knee surgery 12/02/17.  Hypertension  This is a chronic problem. The current episode started more than 1 year ago. The problem has been resolved since onset. The problem is controlled. Pertinent negatives include no headaches, malaise/fatigue, peripheral edema or shortness of breath. The current treatment provides moderate improvement. There is no history of CAD/MI or CVA.  Gastroesophageal Reflux  She reports no belching, no coughing or no heartburn. This is a chronic problem. The current episode started more than 1 year ago. The problem occurs occasionally. The problem has been waxing and waning. She has tried a PPI for the symptoms.  Arthritis  Presents for follow-up visit. She complains of pain and stiffness. The symptoms have been stable. Affected locations include the right knee. Her pain is at a severity of 10/10.  Insomnia  Primary symptoms: difficulty falling asleep, no malaise/fatigue.  The current episode started more than one month. The onset quality is gradual.      Review of Systems  Constitutional: Negative for malaise/fatigue.  Respiratory: Negative for cough and shortness of breath.   Gastrointestinal: Negative for heartburn.  Musculoskeletal: Positive for arthralgias, arthritis and stiffness.  Neurological: Negative for headaches.  Psychiatric/Behavioral: The patient has insomnia.   All other systems reviewed and are negative.      Objective:   Physical Exam  Constitutional: She is oriented to person, place, and time. She appears well-developed and well-nourished. No distress.  HENT:  Head: Normocephalic and atraumatic.  Right  Ear: External ear normal.  Left Ear: External ear normal.  Mouth/Throat: Oropharynx is clear and moist.  Eyes: Pupils are equal, round, and reactive to light.  Neck: Normal range of motion. Neck supple. No thyromegaly present.  Cardiovascular: Normal rate, regular rhythm, normal heart sounds and intact distal pulses.  No murmur heard. Pulmonary/Chest: Effort normal and breath sounds normal. No respiratory distress. She has no wheezes.  Abdominal: Soft. Bowel sounds are normal. She exhibits no distension. There is no tenderness.  Musculoskeletal: Normal range of motion. She exhibits tenderness. She exhibits no edema.  Pain in right knee with flexion or extension  Neurological: She is alert and oriented to person, place, and time. She has normal reflexes. No cranial nerve deficit.  Stutter present  Skin: Skin is warm and dry.  Psychiatric: She has a normal mood and affect. Her behavior is normal. Judgment and thought content normal.  Vitals reviewed.   BP 133/85   Pulse 99   Temp (!) 96.8 F (36 C) (Oral)   Ht 5' 7"  (1.702 m)   Wt 213 lb (96.6 kg)   BMI 33.36 kg/m      Assessment & Plan:  Brittany Durham comes in today with chief complaint of Medical Management of Chronic Issues (six month recheck)   Diagnosis and orders addressed:  1. Gastroesophageal reflux disease without esophagitis - CMP14+EGFR - CBC with Differential/Platelet  2. HYPERTENSION, BENIGN - CMP14+EGFR - CBC with Differential/Platelet  3. Morbid obesity ([place]) - CMP14+EGFR - CBC with Differential/Platelet  4. Vitamin D deficiency - CMP14+EGFR - CBC with Differential/Platelet - VITAMIN D 25 Hydroxy (Vit-D Deficiency, Fractures)  5. Language difficulty -  CMP14+EGFR - CBC with Differential/Platelet  6. Primary osteoarthritis of right knee - CMP14+EGFR - CBC with Differential/Platelet  7. Insomnia, unspecified type - CMP14+EGFR - CBC with Differential/Platelet   Labs pending Health Maintenance  reviewed Diet and exercise encouraged  Follow up plan: 6 months and keep follow up with ortho   Brittany Dun, FNP

## 2017-11-18 NOTE — Patient Instructions (Signed)

## 2017-11-19 LAB — CMP14+EGFR
ALT: 19 IU/L (ref 0–32)
AST: 19 IU/L (ref 0–40)
Albumin/Globulin Ratio: 1.5 (ref 1.2–2.2)
Albumin: 4.6 g/dL (ref 3.5–5.5)
Alkaline Phosphatase: 93 IU/L (ref 39–117)
BUN/Creatinine Ratio: 17 (ref 9–23)
BUN: 12 mg/dL (ref 6–24)
Bilirubin Total: 0.4 mg/dL (ref 0.0–1.2)
CO2: 26 mmol/L (ref 20–29)
Calcium: 9.8 mg/dL (ref 8.7–10.2)
Chloride: 102 mmol/L (ref 96–106)
Creatinine, Ser: 0.72 mg/dL (ref 0.57–1.00)
GFR calc Af Amer: 114 mL/min/{1.73_m2} (ref >59)
GFR calc non Af Amer: 99 mL/min/{1.73_m2} (ref >59)
Globulin, Total: 3 g/dL (ref 1.5–4.5)
Glucose: 94 mg/dL (ref 65–99)
Potassium: 3.9 mmol/L (ref 3.5–5.2)
Sodium: 141 mmol/L (ref 134–144)
Total Protein: 7.6 g/dL (ref 6.0–8.5)

## 2017-11-19 LAB — CBC WITH DIFFERENTIAL/PLATELET
Basophils Absolute: 0 10*3/uL (ref 0.0–0.2)
Basos: 0 %
EOS (ABSOLUTE): 0.1 10*3/uL (ref 0.0–0.4)
Eos: 1 %
Hematocrit: 34.5 % (ref 34.0–46.6)
Hemoglobin: 11.5 g/dL (ref 11.1–15.9)
Immature Grans (Abs): 0 10*3/uL (ref 0.0–0.1)
Immature Granulocytes: 0 %
Lymphocytes Absolute: 2.3 10*3/uL (ref 0.7–3.1)
Lymphs: 37 %
MCH: 28.3 pg (ref 26.6–33.0)
MCHC: 33.3 g/dL (ref 31.5–35.7)
MCV: 85 fL (ref 79–97)
Monocytes Absolute: 0.5 10*3/uL (ref 0.1–0.9)
Monocytes: 8 %
Neutrophils Absolute: 3.2 10*3/uL (ref 1.4–7.0)
Neutrophils: 54 %
Platelets: 204 10*3/uL (ref 150–450)
RBC: 4.07 x10E6/uL (ref 3.77–5.28)
RDW: 14.3 % (ref 12.3–15.4)
WBC: 6.1 10*3/uL (ref 3.4–10.8)

## 2017-11-19 LAB — VITAMIN D 25 HYDROXY (VIT D DEFICIENCY, FRACTURES): Vit D, 25-Hydroxy: 28.3 ng/mL — ABNORMAL LOW (ref 30.0–100.0)

## 2017-11-20 ENCOUNTER — Other Ambulatory Visit: Payer: Self-pay | Admitting: Nurse Practitioner

## 2017-11-20 ENCOUNTER — Other Ambulatory Visit: Payer: Self-pay | Admitting: Family

## 2017-11-20 DIAGNOSIS — K279 Peptic ulcer, site unspecified, unspecified as acute or chronic, without hemorrhage or perforation: Secondary | ICD-10-CM

## 2017-11-26 ENCOUNTER — Encounter: Payer: Self-pay | Admitting: Internal Medicine

## 2017-11-26 ENCOUNTER — Ambulatory Visit (INDEPENDENT_AMBULATORY_CARE_PROVIDER_SITE_OTHER): Payer: Medicare Other | Admitting: Internal Medicine

## 2017-11-26 VITALS — BP 160/100 | HR 103 | Temp 97.3°F | Ht 67.0 in | Wt 212.8 lb

## 2017-11-26 DIAGNOSIS — K279 Peptic ulcer, site unspecified, unspecified as acute or chronic, without hemorrhage or perforation: Secondary | ICD-10-CM | POA: Diagnosis not present

## 2017-11-26 DIAGNOSIS — K219 Gastro-esophageal reflux disease without esophagitis: Secondary | ICD-10-CM | POA: Diagnosis not present

## 2017-11-26 NOTE — Progress Notes (Signed)
Primary Care Physician:  Sharion Balloon, FNP Primary Gastroenterologist:  Dr. Gala Romney  Pre-Procedure History & Physical: HPI:  Brittany Durham is a 47 y.o. female here for follow-up of GERD/ peptic ulcer disease.  Doing well. NSAID-induced peptic ulcer disease healed at follow-up EGD. Up-to-date on colonoscopy ; due for surveillance in 10 years. Protonix 40 mg once daily controlling reflux symptoms very well. If she misses a dose, she has a flare; no alarm symptoms such as dysphagia or melena. She is getting ready to have surgery on her right knee in the near future. She is avoiding all nonsteroidal agents.  Past Medical History:  Diagnosis Date  . Hypertension   . Knee pain    left meniscus tear s/p repair 11/2014  . Seizures ([place])   . Stuttering    patient reports this started after her knee surgery in 11/2014.   Marland Kitchen Thyroid nodule     Past Surgical History:  Procedure Laterality Date  . COLONOSCOPY N/A 08/23/2016   Procedure: COLONOSCOPY;  Surgeon: Daneil Dolin, MD;  [facility];  Service: Endoscopy;  Laterality: N/A;  1:00pm  . ESOPHAGOGASTRODUODENOSCOPY N/A 05/03/2016   Procedure: ESOPHAGOGASTRODUODENOSCOPY (EGD);  Surgeon: Daneil Dolin, MD;  [facility];  Service: Endoscopy;  Laterality: N/A;  . ESOPHAGOGASTRODUODENOSCOPY N/A 08/23/2016   Procedure: ESOPHAGOGASTRODUODENOSCOPY (EGD);  Surgeon: Daneil Dolin, MD;  [facility];  Service: Endoscopy;  Laterality: N/A;  . KNEE ARTHROSCOPY WITH MEDIAL MENISECTOMY Left 12/09/2014   Procedure: KNEE ARTHROSCOPY WITH PARTIAL MEDIAL AND LATERAL MENISECTOMY;  Surgeon: Sanjuana Kava, MD;  Location: [facility];  Service: Orthopedics;  Laterality: Left;  . TUBAL LIGATION    . WISDOM TOOTH EXTRACTION      Prior to Admission medications   Medication Sig Start Date End Date Taking? Authorizing Provider  amLODipine (NORVASC) 10 MG tablet Take 1 tablet (10 mg total) by mouth daily. 04/30/17  Yes Hawks, Christy A,  FNP  gabapentin (NEURONTIN) 300 MG capsule Take 1 capsule (300 mg total) by mouth 3 (three) times daily. 07/29/17  Yes Hawks, Alyse Low A, FNP  lisinopril (PRINIVIL,ZESTRIL) 20 MG tablet TAKE 1 TABLET DAILY 10/22/17  Yes Hawks, Christy A, FNP  pantoprazole (PROTONIX) 40 MG tablet Take 1 tablet by mouth 2 (two) times daily before a meal. 11/20/17  Yes Annitta Needs, NP  traMADol (ULTRAM) 50 MG tablet Take 1 tablet (50 mg total) by mouth every 6 (six) hours as needed. 10/22/17  Yes Hawks, Theador Hawthorne, FNP  Vitamin D, Ergocalciferol, (DRISDOL) 50000 units CAPS capsule TAKE 1 CAPSULE ONCE A WEEK 11/22/17  Yes Sharion Balloon, FNP    Allergies as of 11/26/2017  . (No Known Allergies)    Family History  Problem Relation Age of Onset  . Hypertension Mother   . Hypertension Brother   . Colon cancer Neg Hx     Social History   Socioeconomic History  . Marital status: Single    Spouse name: Not on file  . Number of children: 1  . Years of education: 73  . Highest education level: Not on file  Occupational History  . Occupation: Umemployed  Social Needs  . Financial resource strain: Not on file  . Food insecurity:    Worry: Not on file    Inability: Not on file  . Transportation needs:    Medical: Not on file    Non-medical: Not on file  Tobacco Use  . Smoking status: Never Smoker  .  Smokeless tobacco: Never Used  Substance and Sexual Activity  . Alcohol use: No  . Drug use: No  . Sexual activity: Yes    Birth control/protection: Surgical  Lifestyle  . Physical activity:    Days per week: Not on file    Minutes per session: Not on file  . Stress: Not on file  Relationships  . Social connections:    Talks on phone: Not on file    Gets together: Not on file    Attends religious service: Not on file    Active member of club or organization: Not on file    Attends meetings of clubs or organizations: Not on file    Relationship status: Not on file  . Intimate partner violence:     Fear of current or ex partner: Not on file    Emotionally abused: Not on file    Physically abused: Not on file    Forced sexual activity: Not on file  Other Topics Concern  . Not on file  Social History Narrative   Lives at home alone.   Right-handed.   Occasional caffeine use.    Review of Systems: See HPI, otherwise negative ROS  Physical Exam: BP (!) 160/100   Pulse (!) 103   Temp (!) 97.3 F (36.3 C) (Oral)   Ht 5\' 7"  (1.702 m)   Wt 212 lb 12.8 oz (96.5 kg)   LMP 10/21/2017 (Approximate)   BMI 33.33 kg/m  General:   Alert,   pleasant and cooperative in NAD She does stutter. Neck:  Supple; no masses or thyromegaly. No significant cervical adenopathy. Lungs:  Clear throughout to auscultation.   No wheezes, crackles, or rhonchi. No acute distress. Heart:  Regular rate and rhythm; no murmurs, clicks, rubs,  or gallops. Abdomen: Non-distended, normal bowel sounds.  Soft and nontender without appreciable mass or hepatosplenomegaly.  Pulses:  Normal pulses noted. Extremities:  Without clubbing or edema.  Impression:  History of NSAID-induced peptic ulcer disease.  GERD now well controlled on once daily PPI. From a GI standpoint, doing very well.  Recommendations:Continue Protonix 40 mg daily  GERD information provided  Avoid NSAID agents like Aleve and Advil  Office visit in 1 year    Notice: This dictation was prepared with Dragon dictation along with smaller phrase technology. Any transcriptional errors that result from this process are unintentional and may not be corrected upon review.

## 2017-11-26 NOTE — Patient Instructions (Signed)
Continue Protonix 40 mg daily  GERD information provided  Avoid NSAID agents like Aleve and Advil  Office visit in 1 year

## 2017-12-02 DIAGNOSIS — M12261 Villonodular synovitis (pigmented), right knee: Secondary | ICD-10-CM | POA: Diagnosis not present

## 2017-12-02 DIAGNOSIS — M2241 Chondromalacia patellae, right knee: Secondary | ICD-10-CM | POA: Diagnosis not present

## 2017-12-02 DIAGNOSIS — X58XXXA Exposure to other specified factors, initial encounter: Secondary | ICD-10-CM | POA: Diagnosis not present

## 2017-12-02 DIAGNOSIS — S83281A Other tear of lateral meniscus, current injury, right knee, initial encounter: Secondary | ICD-10-CM

## 2017-12-02 DIAGNOSIS — Y999 Unspecified external cause status: Secondary | ICD-10-CM | POA: Diagnosis not present

## 2017-12-02 DIAGNOSIS — G8918 Other acute postprocedural pain: Secondary | ICD-10-CM | POA: Diagnosis not present

## 2017-12-02 DIAGNOSIS — M67361 Transient synovitis, right knee: Secondary | ICD-10-CM

## 2017-12-03 ENCOUNTER — Other Ambulatory Visit: Payer: Self-pay

## 2017-12-03 ENCOUNTER — Emergency Department (HOSPITAL_COMMUNITY)
Admission: EM | Admit: 2017-12-03 | Discharge: 2017-12-03 | Disposition: A | Discharge status: Home / Self Care | Payer: Medicare Other | Attending: Emergency Medicine | Admitting: Emergency Medicine

## 2017-12-03 ENCOUNTER — Encounter (HOSPITAL_COMMUNITY): Payer: Self-pay

## 2017-12-03 DIAGNOSIS — G8918 Other acute postprocedural pain: Secondary | ICD-10-CM | POA: Diagnosis not present

## 2017-12-03 DIAGNOSIS — T8131XA Disruption of external operation (surgical) wound, not elsewhere classified, initial encounter: Secondary | ICD-10-CM | POA: Diagnosis not present

## 2017-12-03 DIAGNOSIS — T8189XA Other complications of procedures, not elsewhere classified, initial encounter: Secondary | ICD-10-CM | POA: Diagnosis not present

## 2017-12-03 DIAGNOSIS — R000 Tachycardia, unspecified: Secondary | ICD-10-CM | POA: Diagnosis not present

## 2017-12-03 DIAGNOSIS — Z79899 Other long term (current) drug therapy: Secondary | ICD-10-CM | POA: Insufficient documentation

## 2017-12-03 DIAGNOSIS — M25561 Pain in right knee: Secondary | ICD-10-CM | POA: Diagnosis not present

## 2017-12-03 DIAGNOSIS — I10 Essential (primary) hypertension: Secondary | ICD-10-CM | POA: Insufficient documentation

## 2017-12-03 MED ORDER — OXYCODONE-ACETAMINOPHEN 5-325 MG PO TABS
1.0000 | ORAL_TABLET | Freq: Once | ORAL | Status: AC
Start: 2017-12-03 — End: 2017-12-03
  Administered 2017-12-03: 1 via ORAL
  Filled 2017-12-03: qty 1

## 2017-12-03 MED ORDER — MORPHINE SULFATE (PF) 4 MG/ML IV SOLN
4.0000 mg | Freq: Once | INTRAVENOUS | Status: AC
  Administered 2017-12-03: 4 mg via INTRAMUSCULAR
  Filled 2017-12-03: qty 1

## 2017-12-03 NOTE — ED Triage Notes (Signed)
Pt reports having right knee surgery yesterday. Pt has taken a total of 3 Norco tablets (nurse counted pills- 32 present) since being discharged home yesterday. Pt denies using ice packs as directed, has them on  Now, placed by EMS.  Pt reports taking 2 pills last night around 10:30 and awoke around 2:30 with pain.

## 2017-12-03 NOTE — ED Notes (Signed)
Right knee cleaned and wrapped with telfa, abdominal pads, curlex and ace bandage. Ice pack applied

## 2017-12-03 NOTE — Discharge Instructions (Addendum)
You were seen today for ongoing right knee pain.  Take your medications as prescribed.  You may take your medications every 4-6 hours as prescribed on the bottle.

## 2017-12-03 NOTE — ED Provider Notes (Signed)
[facility] EMERGENCY DEPARTMENT Provider Note   CSN: [id number] Arrival date & time: 12/03/17  0248     History   Chief Complaint Chief Complaint  Patient presents with  . Knee Pain    s/p surgery    HPI Brittany Durham is a 47 y.o. female.  HPI  This is a 47 year old female with a history of hypertension, stuttering, seizures who presents with right knee pain.  Patient had a right knee arthroscopic yesterday by Dr. Lorin Mercy.  She reports that she was discharged home around 11:30 AM.  She had pain throughout the day increasing.  She took one Norco during the day and took 2 at 10:30 PM.  She woke up at 1:30 AM with increasing and worsening pain.  She has not noted any fevers.  Pain is of the anterior aspect of her knee.  She rates her pain 10 out of 10.  She reports that her mom and daughter were instructed to give her pain medications every 6-8 hours.  She is only taken 3 pills since discharge.  Is unsure if she has had any redness or drainage at the site as she has kept it wrapped.  Past Medical History:  Diagnosis Date  . Hypertension   . Knee pain    left meniscus tear s/p repair 11/2014  . Seizures ([place])   . Stuttering    patient reports this started after her knee surgery in 11/2014.   Marland Kitchen Thyroid nodule     Patient Active Problem List   Diagnosis Date Noted  . Osteoarthritis 11/18/2017  . Insomnia 11/18/2017  . 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 Tear of lateral meniscus of right knee, current 10/28/2017  . 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 Morbid obesity ([place]) 04/30/2017  . Language difficulty 09/25/2016  . Nausea with vomiting 07/18/2016  . PUD (peptic ulcer disease) 07/18/2016  . Slurred speech   . GI bleed 05/02/2016  . Upper GI bleed 05/02/2016  . Sinus tachycardia 05/02/2016  . Acute blood loss anemia 05/02/2016  . Abdominal pain, epigastric 05/02/2016  . 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 Thyroid nodule 12/14/2014  . Left knee pain  12/14/2014  . Seizures ([place])   . Altered mental status 12/12/2014  . Vitamin D deficiency 06/30/2014  . GERD (gastroesophageal reflux disease) 06/29/2014  . SYNCOPE 03/02/2010  . HYPERTENSION, BENIGN 03/01/2010    Past Surgical History:  Procedure Laterality Date  . COLONOSCOPY N/A 08/23/2016   Procedure: COLONOSCOPY;  Surgeon: Daneil Dolin, MD;  Location: [facility];  Service: Endoscopy;  Laterality: N/A;  1:00pm  . ESOPHAGOGASTRODUODENOSCOPY N/A 05/03/2016   Procedure: ESOPHAGOGASTRODUODENOSCOPY (EGD);  Surgeon: Daneil Dolin, MD;  Location: [facility];  Service: Endoscopy;  Laterality: N/A;  . ESOPHAGOGASTRODUODENOSCOPY N/A 08/23/2016   Procedure: ESOPHAGOGASTRODUODENOSCOPY (EGD);  Surgeon: Daneil Dolin, MD;  Location: [facility];  Service: Endoscopy;  Laterality: N/A;  . KNEE ARTHROSCOPY WITH MEDIAL MENISECTOMY Left 12/09/2014   Procedure: KNEE ARTHROSCOPY WITH PARTIAL MEDIAL AND LATERAL MENISECTOMY;  Surgeon: Sanjuana Kava, MD;  Location: [facility] ORS;  Service: Orthopedics;  Laterality: Left;  . TUBAL LIGATION    . WISDOM TOOTH EXTRACTION       OB History    Gravida  1   Para  1   Term  1   Preterm      AB      Living  1     SAB      TAB  Ectopic      Multiple      Live Births               Home Medications    Prior to Admission medications   Medication Sig Start Date End Date Taking? Authorizing Provider  amLODipine (NORVASC) 10 MG tablet Take 1 tablet (10 mg total) by mouth daily. 04/30/17   Sharion Balloon, FNP  gabapentin (NEURONTIN) 300 MG capsule Take 1 capsule (300 mg total) by mouth 3 (three) times daily. 07/29/17   Sharion Balloon, FNP  lisinopril (PRINIVIL,ZESTRIL) 20 MG tablet TAKE 1 TABLET DAILY 10/22/17   Evelina Dun A, FNP  pantoprazole (PROTONIX) 40 MG tablet Take 1 tablet by mouth 2 (two) times daily before a meal. 11/20/17   Annitta Needs, NP  traMADol (ULTRAM) 50 MG tablet Take 1 tablet (50 mg total) by mouth every 6  (six) hours as needed. 10/22/17   Sharion Balloon, FNP  Vitamin D, Ergocalciferol, (DRISDOL) 50000 units CAPS capsule TAKE 1 CAPSULE ONCE A WEEK 11/22/17   Sharion Balloon, FNP    Family History Family History  Problem Relation Age of Onset  . Hypertension Mother   . Hypertension Brother   . Colon cancer Neg Hx     Social History Social History   Tobacco Use  . Smoking status: Never Smoker  . Smokeless tobacco: Never Used  Substance Use Topics  . Alcohol use: No  . Drug use: No     Allergies   Patient has no known allergies.   Review of Systems Review of Systems  Constitutional: Negative for fever.  Musculoskeletal:       Right knee pain  All other systems reviewed and are negative.    Physical Exam Updated Vital Signs BP 128/80   Pulse (!) 117   Temp 98.2 F (36.8 C) (Oral)   Resp 17   Ht 5\' 7"  (1.702 m)   Wt 96.2 kg (212 lb)   LMP 11/05/2017 (Approximate)   SpO2 97%   BMI 33.20 kg/m   Physical Exam  Constitutional: She is oriented to person, place, and time. She appears well-developed and well-nourished. No distress.  HENT:  Head: Normocephalic and atraumatic.  Cardiovascular: Normal rate and regular rhythm.  Pulmonary/Chest: Effort normal. No respiratory distress.  Musculoskeletal:  Focused examination of the right knee, multiple laparoscopic incisions with scant dried blood, no significant erythema, no large effusion or drainage noted, limited range of motion secondary to pain  Neurological: She is alert and oriented to person, place, and time.  Skin: Skin is warm and dry.  Psychiatric: She has a normal mood and affect.  Nursing note and vitals reviewed.    ED Treatments / Results  Labs (all labs ordered are listed, but only abnormal results are displayed) Labs Reviewed - No data to display  EKG None  Radiology No results found.  Procedures Procedures (including critical care time)  Medications Ordered in ED Medications  morphine 4  MG/ML injection 4 mg (4 mg Intramuscular Given 12/03/17 0346)  oxyCODONE-acetaminophen (PERCOCET/ROXICET) 5-325 MG per tablet 1 tablet (1 tablet Oral Given 12/03/17 0348)     Initial Impression / Assessment and Plan / ED Course  I have reviewed the triage vital signs and the nursing notes.  Pertinent labs & imaging results that were available during my care of the patient were reviewed by me and considered in my medical decision making (see chart for details).     Patient presents with  worsening right knee pain.  She is overall nontoxic-appearing on exam and vital signs are reassuring.  Her incision sites appear appropriate less than 24 hours postop.  Low suspicion for infection, effusion, bleeding.  Knee was rewrapped.  I suspect the patient is not taking enough pain medications at home to adequately control her pain postop.  She was given 1 dose of IM morphine as well as a dose of Percocet.  On recheck, she states she feels much better.  I have encouraged her to take medications as prescribed.  I have reviewed her home medications which is Norco 5/325 take 1 to 2 tablets every 4-6 hours.  She was advised that this has Tylenol in it and did not take additional Tylenol.  Follow-up with Dr. Lorin Mercy.  After history, exam, and medical workup I feel the patient has been appropriately medically screened and is safe for discharge home. Pertinent diagnoses were discussed with the patient. Patient was given return precautions.   Final Clinical Impressions(s) / ED Diagnoses   Final diagnoses:  Post-operative pain    ED Discharge Orders    None       Mally Gavina, Barbette Hair, MD 12/03/17 [phone]

## 2017-12-05 ENCOUNTER — Encounter (INDEPENDENT_AMBULATORY_CARE_PROVIDER_SITE_OTHER): Payer: Self-pay | Admitting: Orthopaedic Surgery

## 2017-12-05 ENCOUNTER — Telehealth (INDEPENDENT_AMBULATORY_CARE_PROVIDER_SITE_OTHER): Payer: Self-pay | Admitting: Radiology

## 2017-12-05 ENCOUNTER — Ambulatory Visit (INDEPENDENT_AMBULATORY_CARE_PROVIDER_SITE_OTHER): Payer: Medicare Other | Admitting: Orthopaedic Surgery

## 2017-12-05 VITALS — BP 134/86 | HR 123 | Ht 67.0 in | Wt 212.0 lb

## 2017-12-05 DIAGNOSIS — M12261 Villonodular synovitis (pigmented), right knee: Secondary | ICD-10-CM

## 2017-12-05 DIAGNOSIS — S83281D Other tear of lateral meniscus, current injury, right knee, subsequent encounter: Secondary | ICD-10-CM

## 2017-12-05 NOTE — Progress Notes (Signed)
Return post knee arthroscopy for anterior lateral meniscal tear and partial synovectomy for PVNS that was anterior to the ACL after a fall on her knee.  She did not take very many pain tablets the first 24 hours after surgery and ended up in the emergency room in the middle of the night and after one injection and one Percocet pill her pain was under control.  Tegaderm is left on today swelling in her knee is mild to moderate.  Ace wrap applied return 1 week for Steri-Strip change on return visit.

## 2017-12-05 NOTE — Telephone Encounter (Signed)
Patient was in office for dressing change today. Prior to leaving, her daughter asked for a letter in order for her mother to be able to get a first floor apartment. Please advise.

## 2017-12-05 NOTE — Telephone Encounter (Signed)
I called discussed. PVNS resected. Should do well and get good relief.

## 2017-12-12 ENCOUNTER — Inpatient Hospital Stay (INDEPENDENT_AMBULATORY_CARE_PROVIDER_SITE_OTHER): Payer: Medicaid Other | Admitting: Orthopaedic Surgery

## 2017-12-17 ENCOUNTER — Encounter (INDEPENDENT_AMBULATORY_CARE_PROVIDER_SITE_OTHER): Payer: Self-pay | Admitting: Surgery

## 2017-12-17 ENCOUNTER — Ambulatory Visit (INDEPENDENT_AMBULATORY_CARE_PROVIDER_SITE_OTHER): Payer: Medicare Other | Admitting: Surgery

## 2017-12-17 VITALS — BP 141/87 | HR 96

## 2017-12-17 DIAGNOSIS — Z9889 Other specified postprocedural states: Secondary | ICD-10-CM

## 2017-12-17 MED ORDER — HYDROCODONE-ACETAMINOPHEN 7.5-325 MG PO TABS: 1.0000 | ORAL_TABLET | Freq: Four times a day (QID) | ORAL | 0 refills | Status: DC | PRN

## 2017-12-17 NOTE — Progress Notes (Signed)
Post-Op Visit Note   Patient: Brittany Durham           Date of Birth: 1971-04-08           MRN: [id number] Visit Date: 12/17/2017 PCP: Sharion Balloon, FNP   Assessment & Plan:  Chief Complaint:  Chief Complaint  Patient presents with  . Right Foot - Pain, Edema  . Right Ankle - Pain, Edema  . Right Knee - Pain, Edema, Post-op Follow-up  Patient returns.  Status post right knee arthroscopy December 02, 2017.  She is doing well.  Has been working on some knee range of motion but has had increased swelling.  No complaints of calf pain    Visit Diagnoses:  1. S/P right knee arthroscopy     Plan: Patient will follow-up with me in 1 week for recheck.  If she still continues to have significant effusion I may consider aspirating the knee at that time.  Refilled Norco.  Ice off and on and elevate knee as much as possible.  Patient will work on range of motion at home.  May also consider formal PT.  We will see how she does.  Follow-Up Instructions: Return in about 1 week (around 12/24/2017) for With Jeneen Rinks.  Possible right knee aspiration.   Orders:  No orders of the defined types were placed in this encounter.  Meds ordered this encounter  Medications  . HYDROcodone-acetaminophen (NORCO) 7.5-325 MG tablet    Sig: Take 1 tablet by mouth every 6 (six) hours as needed for moderate pain.    Dispense:  40 tablet    Refill:  0    Imaging: No results found.  PMFS History: Patient Active Problem List   Diagnosis Date Noted  . Osteoarthritis 11/18/2017  . Insomnia 11/18/2017  . 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 Tear of lateral meniscus of right knee, current 10/28/2017  . 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 Morbid obesity ([place]) 04/30/2017  . Language difficulty 09/25/2016  . Nausea with vomiting 07/18/2016  . PUD (peptic ulcer disease) 07/18/2016  . Slurred speech   . GI bleed 05/02/2016  . Upper GI bleed 05/02/2016  . Sinus tachycardia 05/02/2016    . Acute blood loss anemia 05/02/2016  . Abdominal pain, epigastric 05/02/2016  . 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 Thyroid nodule 12/14/2014  . Left knee pain 12/14/2014  . Seizures ([place])   . Altered mental status 12/12/2014  . Vitamin D deficiency 06/30/2014  . GERD (gastroesophageal reflux disease) 06/29/2014  . SYNCOPE 03/02/2010  . HYPERTENSION, BENIGN 03/01/2010   Past Medical History:  Diagnosis Date  . Hypertension   . Knee pain    left meniscus tear s/p repair 11/2014  . Seizures ([place])   . Stuttering    patient reports this started after her knee surgery in 11/2014.   Marland Kitchen Thyroid nodule     Family History  Problem Relation Age of Onset  . Hypertension Mother   . Hypertension Brother   . Colon cancer Neg Hx     Past Surgical History:  Procedure Laterality Date  . COLONOSCOPY N/A 08/23/2016   Procedure: COLONOSCOPY;  Surgeon: Daneil Dolin, MD;  [facility];  Service: Endoscopy;  Laterality: N/A;  1:00pm  . ESOPHAGOGASTRODUODENOSCOPY N/A 05/03/2016   Procedure: ESOPHAGOGASTRODUODENOSCOPY (EGD);  Surgeon: Daneil Dolin, MD;  [facility]: [facility];  Service: Endoscopy;  Laterality: N/A;  . ESOPHAGOGASTRODUODENOSCOPY N/A 08/23/2016   Procedure: ESOPHAGOGASTRODUODENOSCOPY (EGD);  Surgeon: Daneil Dolin,  MD;  [facility];  Service: Endoscopy;  Laterality: N/A;  . KNEE ARTHROSCOPY WITH MEDIAL MENISECTOMY Left 12/09/2014   Procedure: KNEE ARTHROSCOPY WITH PARTIAL MEDIAL AND LATERAL MENISECTOMY;  Surgeon: Sanjuana Kava, MD;  Location: [facility];  Service: Orthopedics;  Laterality: Left;  . TUBAL LIGATION    . WISDOM TOOTH EXTRACTION     Social History   Occupational History  . Occupation: Umemployed  Tobacco Use  . Smoking status: Never Smoker  . Smokeless tobacco: Never Used  Substance and Sexual Activity  . Alcohol use: No  . Drug use: No  . Sexual activity: Yes    Birth control/protection: Surgical

## 2017-12-23 ENCOUNTER — Other Ambulatory Visit: Payer: Self-pay | Admitting: Family

## 2017-12-23 DIAGNOSIS — M79601 Pain in right arm: Secondary | ICD-10-CM

## 2017-12-26 ENCOUNTER — Ambulatory Visit (INDEPENDENT_AMBULATORY_CARE_PROVIDER_SITE_OTHER): Payer: Medicare Other | Admitting: Surgery

## 2017-12-26 ENCOUNTER — Encounter (INDEPENDENT_AMBULATORY_CARE_PROVIDER_SITE_OTHER): Payer: Self-pay | Admitting: Surgery

## 2017-12-26 VITALS — BP 151/84 | HR 109 | Ht 67.0 in | Wt 218.0 lb

## 2017-12-26 DIAGNOSIS — M25461 Effusion, right knee: Secondary | ICD-10-CM

## 2017-12-26 DIAGNOSIS — Z9889 Other specified postprocedural states: Secondary | ICD-10-CM | POA: Diagnosis not present

## 2017-12-26 NOTE — Progress Notes (Addendum)
Post-Op Visit Note   Patient: Brittany Durham           Date of Birth: 05/25/1970           MRN: [id number] Visit Date: 12/26/2017 PCP: [name], FNP   Assessment & Plan:  Chief Complaint:  Chief Complaint  Patient presents with  . Right Knee - Follow-up   Patient about 3 weeks status post right knee arthroscopy returns.  She continues to have large effusion with knee pain and stiffness.  Last office visit I had discussed doing aspiration.  Visit Diagnoses:  1. S/P right knee arthroscopy   2. Effusion, right knee     Plan: Patient sent right knee was prepped with Betadine and about 45 cc of serosanguineous fluid was aspirated from a superior lateral patellar approach.  I then injected 10 cc of straight Marcaine.  Tolerated procedure well.  Range of motion and pain improved after procedure.  We will go home and ice and elevate.  She cannot take oral NSAIDs due to history of GI ulcers.  Follow-up with me in 3 weeks for recheck.  I may consider trying an intra-articular Marcaine/Depo-Medrol injection in a few weeks if she still continues to have persistent pain and swelling.  Follow-Up Instructions: Return in about 3 weeks (around 01/16/2018) for with Kathlyn Leachman recheck right knee. .   Orders:  No orders of the defined types were placed in this encounter.  No orders of the defined types were placed in this encounter.   Imaging: No results found.  PMFS History: Patient Active Problem List   Diagnosis Date Noted  . Osteoarthritis 11/18/2017  . Insomnia 11/18/2017  . 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 Tear of lateral meniscus of right knee, current 10/28/2017  . 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 Morbid obesity ([facility]) 04/30/2017  . Language difficulty 09/25/2016  . Nausea with vomiting 07/18/2016  . PUD (peptic ulcer disease) 07/18/2016  . Slurred speech   . GI bleed 05/02/2016  . Upper GI bleed 05/02/2016  . Sinus tachycardia 05/02/2016  .  Acute blood loss anemia 05/02/2016  . Abdominal pain, epigastric 05/02/2016  . 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 Thyroid nodule 12/14/2014  . Left knee pain 12/14/2014  . Seizures ([facility])   . Altered mental status 12/12/2014  . Vitamin D deficiency 06/30/2014  . GERD (gastroesophageal reflux disease) 06/29/2014  . SYNCOPE 03/02/2010  . HYPERTENSION, BENIGN 03/01/2010   Past Medical History:  Diagnosis Date  . Hypertension   . Knee pain    left meniscus tear s/p repair 11/2014  . Seizures ([facility])   . Stuttering    patient reports this started after her knee surgery in 11/2014.   Marland Kitchen Thyroid nodule     Family History  Problem Relation Age of Onset  . Hypertension Mother   . Hypertension Brother   . Colon cancer Neg Hx     Past Surgical History:  Procedure Laterality Date  . COLONOSCOPY N/A 08/23/2016   Procedure: COLONOSCOPY;  Surgeon: Corbin Ade, MD;  [facility];  Service: Endoscopy;  Laterality: N/A;  1:00pm  . ESOPHAGOGASTRODUODENOSCOPY N/A 05/03/2016   Procedure: ESOPHAGOGASTRODUODENOSCOPY (EGD);  Surgeon: Corbin Ade, MD;  [facility];  Service: Endoscopy;  Laterality: N/A;  . ESOPHAGOGASTRODUODENOSCOPY N/A 08/23/2016   Procedure: ESOPHAGOGASTRODUODENOSCOPY (EGD);  Surgeon: Corbin Ade, MD;  [facility];  Service: Endoscopy;  Laterality: N/A;  . KNEE ARTHROSCOPY WITH MEDIAL MENISECTOMY Left 12/09/2014  Procedure: KNEE ARTHROSCOPY WITH PARTIAL MEDIAL AND LATERAL MENISECTOMY;  Surgeon: Darreld Mclean, MD;  Location: [facility];  Service: Orthopedics;  Laterality: Left;  . TUBAL LIGATION    . WISDOM TOOTH EXTRACTION     Social History   Occupational History  . Occupation: Umemployed  Tobacco Use  . Smoking status: Never Smoker  . Smokeless tobacco: Never Used  Substance and Sexual Activity  . Alcohol use: No  . Drug use: No  . Sexual activity: Yes    Birth control/protection: Surgical   Exam Right knee range of motion about 5 to 75  degrees.  No signs of infection.  Surgical incisions well-healed.  Calf is nontender.  Does have some lower leg swelling.

## 2018-01-16 ENCOUNTER — Ambulatory Visit (INDEPENDENT_AMBULATORY_CARE_PROVIDER_SITE_OTHER): Payer: Medicare Other | Admitting: Surgery

## 2018-01-16 ENCOUNTER — Other Ambulatory Visit: Payer: Self-pay | Admitting: Family

## 2018-01-16 ENCOUNTER — Encounter (INDEPENDENT_AMBULATORY_CARE_PROVIDER_SITE_OTHER): Payer: Self-pay | Admitting: Surgery

## 2018-01-16 DIAGNOSIS — Z9889 Other specified postprocedural states: Secondary | ICD-10-CM | POA: Diagnosis not present

## 2018-01-16 MED ORDER — LISINOPRIL 20 MG PO TABS: 20.0000 mg | ORAL_TABLET | Freq: Every day | ORAL | 1 refills | Status: DC

## 2018-01-16 NOTE — Progress Notes (Signed)
Post-Op Visit Note   Patient: Brittany Durham           Date of Birth: 24-Dec-1970           MRN: [id number] Visit Date: 01/16/2018 PCP: Sharion Balloon, FNP   Assessment & Plan:  Chief Complaint:  Chief Complaint  Patient presents with  . Right Knee - Pain  Patient is making slow progress.  Continues to have some stiffness and swelling in the knee.  She is working hard with home exercises.  Limited flexion of the knee. Visit Diagnoses:  1. Status post arthroscopy of right knee     Plan: Since patient is moving slow with home exercise program I will schedule formal PT to see if this will help speed up range of motion and strengthening of the knee.  Follow-up with me in 5 weeks for recheck.  Follow-Up Instructions: Return in about 5 weeks (around 02/20/2018) for With Jeneen Rinks for recheck right knee.   Orders:  Orders Placed This Encounter  Procedures  . Ambulatory referral to Physical Therapy   No orders of the defined types were placed in this encounter.   Imaging: No results found.  PMFS History: Patient Active Problem List   Diagnosis Date Noted  . Osteoarthritis 11/18/2017  . Insomnia 11/18/2017  . 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 Tear of lateral meniscus of right knee, current 10/28/2017  . 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 Morbid obesity ([place]) 04/30/2017  . Language difficulty 09/25/2016  . Nausea with vomiting 07/18/2016  . PUD (peptic ulcer disease) 07/18/2016  . Slurred speech   . GI bleed 05/02/2016  . Upper GI bleed 05/02/2016  . Sinus tachycardia 05/02/2016  . Acute blood loss anemia 05/02/2016  . Abdominal pain, epigastric 05/02/2016  . 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 Thyroid nodule 12/14/2014  . Left knee pain 12/14/2014  . Seizures ([place])   . Altered mental status 12/12/2014  . Vitamin D deficiency 06/30/2014  . GERD (gastroesophageal reflux disease) 06/29/2014  . SYNCOPE 03/02/2010  . HYPERTENSION, BENIGN  03/01/2010   Past Medical History:  Diagnosis Date  . Hypertension   . Knee pain    left meniscus tear s/p repair 11/2014  . Seizures ([place])   . Stuttering    patient reports this started after her knee surgery in 11/2014.   Marland Kitchen Thyroid nodule     Family History  Problem Relation Age of Onset  . Hypertension Mother   . Hypertension Brother   . Colon cancer Neg Hx     Past Surgical History:  Procedure Laterality Date  . COLONOSCOPY N/A 08/23/2016   Procedure: COLONOSCOPY;  Surgeon: Daneil Dolin, MD;  [facility];  Service: Endoscopy;  Laterality: N/A;  1:00pm  . ESOPHAGOGASTRODUODENOSCOPY N/A 05/03/2016   Procedure: ESOPHAGOGASTRODUODENOSCOPY (EGD);  Surgeon: Daneil Dolin, MD;  [facility];  Service: Endoscopy;  Laterality: N/A;  . ESOPHAGOGASTRODUODENOSCOPY N/A 08/23/2016   Procedure: ESOPHAGOGASTRODUODENOSCOPY (EGD);  Surgeon: Daneil Dolin, MD;  [facility];  Service: Endoscopy;  Laterality: N/A;  . KNEE ARTHROSCOPY WITH MEDIAL MENISECTOMY Left 12/09/2014   Procedure: KNEE ARTHROSCOPY WITH PARTIAL MEDIAL AND LATERAL MENISECTOMY;  Surgeon: Sanjuana Kava, MD;  Location: [facility];  Service: Orthopedics;  Laterality: Left;  . TUBAL LIGATION    . WISDOM TOOTH EXTRACTION     Social History   Occupational History  . Occupation: Umemployed  Tobacco Use  . Smoking status: Never Smoker  . Smokeless tobacco:  Never Used  Substance and Sexual Activity  . Alcohol use: No  . Drug use: No  . Sexual activity: Yes    Birth control/protection: Surgical   Exam Knee range of motion about 0 to 90 degrees.  Still has some swelling with small effusion.  No signs of infection.  Nontender.

## 2018-01-28 ENCOUNTER — Other Ambulatory Visit: Payer: Self-pay

## 2018-01-28 ENCOUNTER — Encounter: Payer: Self-pay | Admitting: Physical Therapy

## 2018-01-28 ENCOUNTER — Ambulatory Visit: Payer: Medicare Other | Attending: Surgery | Admitting: Physical Therapy

## 2018-01-28 DIAGNOSIS — M6281 Muscle weakness (generalized): Secondary | ICD-10-CM | POA: Diagnosis not present

## 2018-01-28 DIAGNOSIS — M25661 Stiffness of right knee, not elsewhere classified: Secondary | ICD-10-CM | POA: Insufficient documentation

## 2018-01-28 DIAGNOSIS — R600 Localized edema: Secondary | ICD-10-CM

## 2018-01-28 DIAGNOSIS — M25561 Pain in right knee: Secondary | ICD-10-CM

## 2018-01-28 NOTE — Therapy (Signed)
[facility] [place], [place], [address] Phone: [phone]   Fax:  [phone]  Physical Therapy Evaluation  Patient Details  Name: Brittany Durham MRN: [id number] Date of Birth: [birth year] Referring Provider: Benjiman Core    Encounter Date: 01/28/2018  PT End of Session - 01/28/18 1639    Visit Number  1    Number of Visits  12    Date for PT Re-Evaluation  04/28/18    PT Start Time  0320    PT Stop Time  0412    PT Time Calculation (min)  52 min    Activity Tolerance  Patient tolerated treatment well    Behavior During Therapy  [facility] for tasks assessed/performed       Past Medical History:  Diagnosis Date  . Hypertension   . Knee pain    left meniscus tear s/p repair 11/2014  . Seizures ([place])   . Stuttering    patient reports this started after her knee surgery in 11/2014.   Marland Kitchen Thyroid nodule     Past Surgical History:  Procedure Laterality Date  . COLONOSCOPY N/A 08/23/2016   Procedure: COLONOSCOPY;  Surgeon: Daneil Dolin, MD;  [facility];  Service: Endoscopy;  Laterality: N/A;  1:00pm  . ESOPHAGOGASTRODUODENOSCOPY N/A 05/03/2016   Procedure: ESOPHAGOGASTRODUODENOSCOPY (EGD);  Surgeon: Daneil Dolin, MD;  [facility];  Service: Endoscopy;  Laterality: N/A;  . ESOPHAGOGASTRODUODENOSCOPY N/A 08/23/2016   Procedure: ESOPHAGOGASTRODUODENOSCOPY (EGD);  Surgeon: Daneil Dolin, MD;  [facility];  Service: Endoscopy;  Laterality: N/A;  . KNEE ARTHROSCOPY WITH MEDIAL MENISECTOMY Left 12/09/2014   Procedure: KNEE ARTHROSCOPY WITH PARTIAL MEDIAL AND LATERAL MENISECTOMY;  Surgeon: Sanjuana Kava, MD;  Location: [facility];  Service: Orthopedics;  Laterality: Left;  . TUBAL LIGATION    . WISDOM TOOTH EXTRACTION      There were no vitals filed for this visit.   Subjective Assessment - 01/28/18 1652    Subjective  The patient underwent a right knee arthroscopic surgery on 12/02/17.  She presents  to the clinic today with a pain-level of 6/10 and up to 8/10 after standing and walking for more than 10 minutes.  She continues to report swelling of her right knee.  She states that ice helps decrease her pain.      Pertinent History  Suttering; left knee surgery.    Limitations  Standing;Walking    How long can you stand comfortably?  10 minutes.    How long can you walk comfortably?  10 minutes.    Patient Stated Goals  Get out of pain.  Walk more.    Currently in Pain?  Yes    Pain Score  6     Pain Location  Knee    Pain Orientation  Right    Pain Descriptors / Indicators  Aching;Sharp    Pain Type  Surgical pain    Pain Onset  More than a month ago    Pain Frequency  Constant    Aggravating Factors   See above.    Pain Relieving Factors  See above.         [facility] PT Assessment - 01/28/18 0001      Assessment   Medical Diagnosis  S/p arthroscopy of right knee.    Referring Provider  Benjiman Core     Onset Date/Surgical Date  --   12/02/17 (surgery date).     Precautions   Precautions  --  PAIN-FREE RIGHT LE EXERCISES.     Restrictions   Weight Bearing Restrictions  No      Balance Screen   Has the patient fallen in the past 6 months  No    Has the patient had a decrease in activity level because of a fear of falling?   Yes    Is the patient reluctant to leave their home because of a fear of falling?   No      Home Environment   Additional Comments  The patient lives on the second floor of an apartment complex and therefore has several stairs to navigate with a railing.      Prior Function   Level of Independence  Independent      Cognition   Behaviors  --   The patient has a stutter.     Observation/Other Assessments-Edema    Edema  Circumferential      Circumferential Edema   Circumferential - Right  At apex of patella right knee is 3.5 > left measured circumferentially.      ROM / Strength   AROM / PROM / Strength  AROM;Strength      AROM   Overall  AROM Comments  Full active right knee extension with flexion to 95 degrees.      Strength   Overall Strength Comments  Right hip flex/abduction= 4+/5.  Right knee= 4 to 4+/5.  Right ankle is normal.      Palpation   Palpation comment  Patient c/o pain "in" knee pain.      Ambulation/Gait   Gait Comments  Decreased stance time over right LE.                Objective measurements completed on examination: See above findings.      [facility] Adult PT Treatment/Exercise - 01/28/18 0001      Modalities   Modalities  Electrical Stimulation;Vasopneumatic      Electrical Stimulation   Electrical Stimulation Location  Right knee.    Electrical Stimulation Action  IFC    Electrical Stimulation Parameters  1-10 Hz x 20 minutes.    Electrical Stimulation Goals  Edema;Pain      Vasopneumatic   Number Minutes Vasopneumatic   20 minutes    Vasopnuematic Location   --   Right knee.   Vasopneumatic Pressure  Low               PT Short Term Goals - 01/28/18 1727      PT SHORT TERM GOAL #1   Title  STG's=LTG's.        PT Long Term Goals - 01/28/18 1728      PT LONG TERM GOAL #1   Title  Ind with HEP.    Time  6    Period  Weeks    Status  New      PT LONG TERM GOAL #2   Title  Active right knee flexion to 120 degrees+ so the patient can perform functional tasks and do so with pain not > 2-3/10.    Time  6    Period  Weeks    Status  New      PT LONG TERM GOAL #3   Title  Increase right knee strength to a solid 5/5 to provide good stability for accomplishment of functional activities    Time  6    Period  Weeks    Status  New      PT LONG TERM GOAL #  4   Title  Patient perform a reciprocating stair gait with right knee pain not > 3/10.    Time  6    Period  Weeks    Status  New      PT LONG TERM GOAL #5   Title  Perform ADL's with pain not > 3/10.    Time  6    Period  Weeks    Status  New             Plan - 01/28/18 1719    Clinical  Impression Statement  The patient presents to OPPT s/p right knee arthroscopic surgery performed on 12/02/17.  She continues to reports pain with standing, walking and stairs.  She was found today with a loss of right knee flexion and edema.  her pain and deficits have impaired her fucntional mobility at this time. Patient will benefit from skilled physical therapy intervention to address deficits.     History and Personal Factors relevant to plan of care:  Left knee surgery(2016).    Clinical Presentation  Stable    Clinical Decision Making  Low    Rehab Potential  Good    Clinical Impairments Affecting Rehab Potential  Ongoing persistent pain and edema.    PT Frequency  2x / week    PT Duration  6 weeks    PT Treatment/Interventions  ADLs/Self Care Home Management;Cryotherapy;Electrical Stimulation;Ultrasound;Therapeutic activities;Therapeutic exercise;Neuromuscular re-education;Patient/family education;Passive range of motion;Manual techniques;Vasopneumatic Device    PT Next Visit Plan  Nustep/stationary bike; pain-free right quadriceps strenthening; electrical stimulation and vasopneumatic; right hip abduction and "clamshell."    Consulted and Agree with Plan of Care  Patient       Patient will benefit from skilled therapeutic intervention in order to improve the following deficits and impairments:  Abnormal gait, Decreased activity tolerance, Pain, Decreased range of motion, Decreased strength, Increased edema  Visit Diagnosis: Acute pain of right knee - Plan: PT plan of care cert/re-cert  Stiffness of right knee, not elsewhere classified - Plan: PT plan of care cert/re-cert  Localized edema - Plan: PT plan of care cert/re-cert  Muscle weakness (generalized) - Plan: PT plan of care cert/re-cert     Problem List Patient Active Problem List   Diagnosis Date Noted  . Osteoarthritis 11/18/2017  . Insomnia 11/18/2017  . 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 Tear of  lateral meniscus of right knee, current 10/28/2017  . 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 Morbid obesity ([place]) 04/30/2017  . Language difficulty 09/25/2016  . Nausea with vomiting 07/18/2016  . PUD (peptic ulcer disease) 07/18/2016  . Slurred speech   . GI bleed 05/02/2016  . Upper GI bleed 05/02/2016  . Sinus tachycardia 05/02/2016  . Acute blood loss anemia 05/02/2016  . Abdominal pain, epigastric 05/02/2016  . 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 Thyroid nodule 12/14/2014  . Left knee pain 12/14/2014  . Seizures ([place])   . Altered mental status 12/12/2014  . Vitamin D deficiency 06/30/2014  . GERD (gastroesophageal reflux disease) 06/29/2014  . SYNCOPE 03/02/2010  . HYPERTENSION, BENIGN 03/01/2010    APPLE[place]ATE, [place] MPT 01/28/2018, 5:33 PM  [facility] [place], [place], [address] Phone: [phone]   Fax:  [phone]  Name: Brittany Durham MRN: [id number] Date of Birth: 1970-08-03

## 2018-01-30 ENCOUNTER — Encounter: Payer: Self-pay | Admitting: Physical Therapy

## 2018-01-30 ENCOUNTER — Ambulatory Visit: Payer: Medicare Other | Admitting: Physical Therapy

## 2018-01-30 DIAGNOSIS — M6281 Muscle weakness (generalized): Secondary | ICD-10-CM | POA: Diagnosis not present

## 2018-01-30 DIAGNOSIS — M25561 Pain in right knee: Secondary | ICD-10-CM | POA: Diagnosis not present

## 2018-01-30 DIAGNOSIS — M25661 Stiffness of right knee, not elsewhere classified: Secondary | ICD-10-CM

## 2018-01-30 DIAGNOSIS — R600 Localized edema: Secondary | ICD-10-CM

## 2018-01-30 NOTE — Therapy (Signed)
[facility] [place], [place], [address] Phone: [phone]   Fax:  [phone]  Physical Therapy Treatment  Patient Details  Name: Brittany Durham MRN: [id number] Date of Birth: 06-01-1970 Referring Provider: Benjiman Core    Encounter Date: 01/30/2018  PT End of Session - 01/30/18 1712    Visit Number  2    Number of Visits  12    Date for PT Re-Evaluation  04/28/18    PT Start Time  1519    PT Stop Time  1610    PT Time Calculation (min)  51 min    Activity Tolerance  Patient tolerated treatment well    Behavior During Therapy  [facility] for tasks assessed/performed       Past Medical History:  Diagnosis Date  . Hypertension   . Knee pain    left meniscus tear s/p repair 11/2014  . Seizures ([place])   . Stuttering    patient reports this started after her knee surgery in 11/2014.   Marland Kitchen Thyroid nodule     Past Surgical History:  Procedure Laterality Date  . COLONOSCOPY N/A 08/23/2016   Procedure: COLONOSCOPY;  Surgeon: Daneil Dolin, MD;  [facility];  Service: Endoscopy;  Laterality: N/A;  1:00pm  . ESOPHAGOGASTRODUODENOSCOPY N/A 05/03/2016   Procedure: ESOPHAGOGASTRODUODENOSCOPY (EGD);  Surgeon: Daneil Dolin, MD;  [facility];  Service: Endoscopy;  Laterality: N/A;  . ESOPHAGOGASTRODUODENOSCOPY N/A 08/23/2016   Procedure: ESOPHAGOGASTRODUODENOSCOPY (EGD);  Surgeon: Daneil Dolin, MD;  [facility];  Service: Endoscopy;  Laterality: N/A;  . KNEE ARTHROSCOPY WITH MEDIAL MENISECTOMY Left 12/09/2014   Procedure: KNEE ARTHROSCOPY WITH PARTIAL MEDIAL AND LATERAL MENISECTOMY;  Surgeon: Sanjuana Kava, MD;  Location: [facility];  Service: Orthopedics;  Laterality: Left;  . TUBAL LIGATION    . WISDOM TOOTH EXTRACTION      There were no vitals filed for this visit.  Subjective Assessment - 01/30/18 1515    Subjective  Reports falling this past monday when she tripped over her grandson and hit her R  knee.    Pertinent History  Suttering; left knee surgery.    Limitations  Standing;Walking    How long can you stand comfortably?  10 minutes.    How long can you walk comfortably?  10 minutes.    Patient Stated Goals  Get out of pain.  Walk more.    Currently in Pain?  Yes    Pain Score  --   No pain rating provided   Pain Location  Knee    Pain Orientation  Right    Pain Descriptors / Indicators  Discomfort    Pain Type  Surgical pain         [facility] PT Assessment - 01/30/18 0001      Assessment   Medical Diagnosis  S/p arthroscopy of right knee.    Onset Date/Surgical Date  12/02/17                   [facility] Adult PT Treatment/Exercise - 01/30/18 0001      Exercises   Exercises  Knee/Hip      Knee/Hip Exercises: Aerobic   Nustep  L4 x10 min      Knee/Hip Exercises: Standing   Forward Lunges  Right;2 sets;10 reps    Hip Abduction  AROM;Right;2 sets;10 reps;Knee straight    Rocker Board  3 minutes      Knee/Hip Exercises: Supine   Short Arc  Quad Sets  AROM;Right;2 sets;10 reps    Heel Slides  AROM;Right;2 sets;10 reps    Hip Adduction Isometric  Strengthening;Both;20 reps    Straight Leg Raises  AROM;Right;15 reps      Modalities   Modalities  Management consultant  IFC    Electrical Stimulation Parameters  80-150 hz x15 min    Electrical Stimulation Goals  Edema;Pain      Vasopneumatic   Number Minutes Vasopneumatic   15 minutes    Vasopnuematic Location   Knee    Vasopneumatic Pressure  Low    Vasopneumatic Temperature   63               PT Short Term Goals - 01/28/18 1727      PT SHORT TERM GOAL #1   Title  STG's=LTG's.        PT Long Term Goals - 01/28/18 1728      PT LONG TERM GOAL #1   Title  Ind with HEP.    Time  6    Period  Weeks    Status  New      PT LONG TERM GOAL #2   Title  Active right knee  flexion to 120 degrees+ so the patient can perform functional tasks and do so with pain not > 2-3/10.    Time  6    Period  Weeks    Status  New      PT LONG TERM GOAL #3   Title  Increase right knee strength to a solid 5/5 to provide good stability for accomplishment of functional activities    Time  6    Period  Weeks    Status  New      PT LONG TERM GOAL #4   Title  Patient perform a reciprocating stair gait with right knee pain not > 3/10.    Time  6    Period  Weeks    Status  New      PT LONG TERM GOAL #5   Title  Perform ADL's with pain not > 3/10.    Time  6    Period  Weeks    Status  New            Plan - 01/30/18 1711    Clinical Impression Statement  Patient tolerated today's treatment fairly well as she reported intermittant discomfort with exercises that required knee flexion. Patient able to complete strengthening exercises well with fairly good R quad activation. Edema noted primarily along superior R knee. Normal modalities response noted following removal of the modalities.    Rehab Potential  Good    Clinical Impairments Affecting Rehab Potential  Ongoing persistent pain and edema.    PT Frequency  2x / week    PT Duration  6 weeks    PT Treatment/Interventions  ADLs/Self Care Home Management;Cryotherapy;Electrical Stimulation;Ultrasound;Therapeutic activities;Therapeutic exercise;Neuromuscular re-education;Patient/family education;Passive range of motion;Manual techniques;Vasopneumatic Device    PT Next Visit Plan  Nustep/stationary bike; pain-free right quadriceps strenthening; electrical stimulation and vasopneumatic; right hip abduction and "clamshell."    Consulted and Agree with Plan of Care  Patient       Patient will benefit from skilled therapeutic intervention in order to improve the following deficits and impairments:  Abnormal gait, Decreased activity tolerance, Pain, Decreased range of motion, Decreased strength, Increased edema  Visit  Diagnosis:  Acute pain of right knee  Stiffness of right knee, not elsewhere classified  Localized edema  Muscle weakness (generalized)     Problem List Patient Active Problem List   Diagnosis Date Noted  . Osteoarthritis 11/18/2017  . Insomnia 11/18/2017  . 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 Tear of lateral meniscus of right knee, current 10/28/2017  . 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 Morbid obesity ([place]) 04/30/2017  . Language difficulty 09/25/2016  . Nausea with vomiting 07/18/2016  . PUD (peptic ulcer disease) 07/18/2016  . Slurred speech   . GI bleed 05/02/2016  . Upper GI bleed 05/02/2016  . Sinus tachycardia 05/02/2016  . Acute blood loss anemia 05/02/2016  . Abdominal pain, epigastric 05/02/2016  . 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 Thyroid nodule 12/14/2014  . Left knee pain 12/14/2014  . Seizures ([place])   . Altered mental status 12/12/2014  . Vitamin D deficiency 06/30/2014  . GERD (gastroesophageal reflux disease) 06/29/2014  . SYNCOPE 03/02/2010  . HYPERTENSION, BENIGN 03/01/2010    Standley Brooking, PTA 01/30/2018, 5:14 PM  [facility] [place], [place], [address] Phone: [phone]   Fax:  [phone]  Name: Brittany Durham MRN: [id number] Date of Birth: 09/09/1970

## 2018-02-04 ENCOUNTER — Encounter: Payer: Self-pay | Admitting: Physical Therapy

## 2018-02-04 ENCOUNTER — Ambulatory Visit: Payer: Medicare Other | Admitting: Physical Therapy

## 2018-02-04 DIAGNOSIS — M25661 Stiffness of right knee, not elsewhere classified: Secondary | ICD-10-CM

## 2018-02-04 DIAGNOSIS — R600 Localized edema: Secondary | ICD-10-CM | POA: Diagnosis not present

## 2018-02-04 DIAGNOSIS — M6281 Muscle weakness (generalized): Secondary | ICD-10-CM | POA: Diagnosis not present

## 2018-02-04 DIAGNOSIS — M25561 Pain in right knee: Secondary | ICD-10-CM

## 2018-02-04 NOTE — Therapy (Signed)
[facility] [place], [place], [address] Phone: [phone]   Fax:  [phone]  Physical Therapy Treatment  Patient Details  Name: Brittany Durham MRN: [id number] Date of Birth: [birth year] Referring Provider: Benjiman Core    Encounter Date: 02/04/2018  PT End of Session - 02/04/18 1527    Visit Number  3    Number of Visits  12    Date for PT Re-Evaluation  04/28/18    PT Start Time  7001    PT Stop Time  1602    PT Time Calculation (min)  46 min    Activity Tolerance  Patient tolerated treatment well    Behavior During Therapy  [facility] for tasks assessed/performed       Past Medical History:  Diagnosis Date  . Hypertension   . Knee pain    left meniscus tear s/p repair 11/2014  . Seizures ([place])   . Stuttering    patient reports this started after her knee surgery in 11/2014.   Marland Kitchen Thyroid nodule     Past Surgical History:  Procedure Laterality Date  . COLONOSCOPY N/A 08/23/2016   Procedure: COLONOSCOPY;  Surgeon: Daneil Dolin, MD;  [facility];  Service: Endoscopy;  Laterality: N/A;  1:00pm  . ESOPHAGOGASTRODUODENOSCOPY N/A 05/03/2016   Procedure: ESOPHAGOGASTRODUODENOSCOPY (EGD);  Surgeon: Daneil Dolin, MD;  [facility];  Service: Endoscopy;  Laterality: N/A;  . ESOPHAGOGASTRODUODENOSCOPY N/A 08/23/2016   Procedure: ESOPHAGOGASTRODUODENOSCOPY (EGD);  Surgeon: Daneil Dolin, MD;  [facility];  Service: Endoscopy;  Laterality: N/A;  . KNEE ARTHROSCOPY WITH MEDIAL MENISECTOMY Left 12/09/2014   Procedure: KNEE ARTHROSCOPY WITH PARTIAL MEDIAL AND LATERAL MENISECTOMY;  Surgeon: Sanjuana Kava, MD;  Location: [facility];  Service: Orthopedics;  Laterality: Left;  . TUBAL LIGATION    . WISDOM TOOTH EXTRACTION      There were no vitals filed for this visit.  Subjective Assessment - 02/04/18 1517    Subjective  Reports that she is still sore.    Pertinent History  Suttering; left knee surgery.     Limitations  Standing;Walking    How long can you stand comfortably?  10 minutes.    How long can you walk comfortably?  10 minutes.    Patient Stated Goals  Get out of pain.  Walk more.    Currently in Pain?  Yes    Pain Score  3     Pain Location  Knee    Pain Orientation  Right    Pain Descriptors / Indicators  Sore    Pain Type  Surgical pain    Pain Onset  More than a month ago         [facility] PT Assessment - 02/04/18 0001      Assessment   Medical Diagnosis  S/p arthroscopy of right knee.    Onset Date/Surgical Date  12/02/17      Restrictions   Weight Bearing Restrictions  No                   [facility] Adult PT Treatment/Exercise - 02/04/18 0001      Exercises   Exercises  Knee/Hip      Knee/Hip Exercises: Aerobic   Nustep  L4 x10 min      Knee/Hip Exercises: Standing   Forward Lunges  Right;2 sets;10 reps    Hip Abduction  AROM;Right;2 sets;10 reps;Knee straight    Forward Step Up  Right;2 sets;10 reps;Hand  Hold: 2;Step Height: 6"    Rocker Board  3 minutes      Knee/Hip Exercises: Supine   Short Arc Quad Sets  AROM;Right;2 sets;10 reps    Straight Leg Raises  AROM;Right;15 reps      Modalities   Modalities  Management consultant  IFC    Electrical Stimulation Parameters  80-150 hz x15 min    Electrical Stimulation Goals  Edema      Vasopneumatic   Number Minutes Vasopneumatic   15 minutes    Vasopnuematic Location   Knee    Vasopneumatic Pressure  Low    Vasopneumatic Temperature   70               PT Short Term Goals - 01/28/18 1727      PT SHORT TERM GOAL #1   Title  STG's=LTG's.        PT Long Term Goals - 01/28/18 1728      PT LONG TERM GOAL #1   Title  Ind with HEP.    Time  6    Period  Weeks    Status  New      PT LONG TERM GOAL #2   Title  Active right knee flexion to 120 degrees+ so the  patient can perform functional tasks and do so with pain not > 2-3/10.    Time  6    Period  Weeks    Status  New      PT LONG TERM GOAL #3   Title  Increase right knee strength to a solid 5/5 to provide good stability for accomplishment of functional activities    Time  6    Period  Weeks    Status  New      PT LONG TERM GOAL #4   Title  Patient perform a reciprocating stair gait with right knee pain not > 3/10.    Time  6    Period  Weeks    Status  New      PT LONG TERM GOAL #5   Title  Perform ADL's with pain not > 3/10.    Time  6    Period  Weeks    Status  New            Plan - 02/04/18 1550    Clinical Impression Statement  Patient continues to tolerate and progress in treatment well. Patient able to progress to forward step ups with patient "feeling it" in R knee during the step ups. No other pain complaints reported by patient. Patient demonstrated good R quad activation with supine exercises. Normal modalities response noted following removal of the modalities.    Rehab Potential  Good    Clinical Impairments Affecting Rehab Potential  Ongoing persistent pain and edema.    PT Frequency  2x / week    PT Duration  6 weeks    PT Treatment/Interventions  ADLs/Self Care Home Management;Cryotherapy;Electrical Stimulation;Ultrasound;Therapeutic activities;Therapeutic exercise;Neuromuscular re-education;Patient/family education;Passive range of motion;Manual techniques;Vasopneumatic Device    PT Next Visit Plan  Nustep/stationary bike; pain-free right quadriceps strenthening; electrical stimulation and vasopneumatic; right hip abduction and "clamshell."    Consulted and Agree with Plan of Care  Patient       Patient will benefit from skilled therapeutic intervention in order to improve the following deficits and impairments:  Abnormal gait, Decreased activity tolerance,  Pain, Decreased range of motion, Decreased strength, Increased edema  Visit Diagnosis: Acute pain of  right knee  Stiffness of right knee, not elsewhere classified  Localized edema  Muscle weakness (generalized)     Problem List Patient Active Problem List   Diagnosis Date Noted  . Osteoarthritis 11/18/2017  . Insomnia 11/18/2017  . 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 Tear of lateral meniscus of right knee, current 10/28/2017  . 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 Morbid obesity ([place]) 04/30/2017  . Language difficulty 09/25/2016  . Nausea with vomiting 07/18/2016  . PUD (peptic ulcer disease) 07/18/2016  . Slurred speech   . GI bleed 05/02/2016  . Upper GI bleed 05/02/2016  . Sinus tachycardia 05/02/2016  . Acute blood loss anemia 05/02/2016  . Abdominal pain, epigastric 05/02/2016  . 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 Thyroid nodule 12/14/2014  . Left knee pain 12/14/2014  . Seizures ([place])   . Altered mental status 12/12/2014  . Vitamin D deficiency 06/30/2014  . GERD (gastroesophageal reflux disease) 06/29/2014  . SYNCOPE 03/02/2010  . HYPERTENSION, BENIGN 03/01/2010    Standley Brooking, PTA 02/04/2018, 4:11 PM  [facility] [place], [place], [address] Phone: [phone]   Fax:  [phone]  Name: Brittany Durham MRN: [id number] Date of Birth: December 04, 1970

## 2018-02-06 ENCOUNTER — Ambulatory Visit: Payer: Medicare Other | Admitting: Physical Therapy

## 2018-02-06 ENCOUNTER — Encounter: Payer: Self-pay | Admitting: Physical Therapy

## 2018-02-06 DIAGNOSIS — M25561 Pain in right knee: Secondary | ICD-10-CM

## 2018-02-06 DIAGNOSIS — M6281 Muscle weakness (generalized): Secondary | ICD-10-CM

## 2018-02-06 DIAGNOSIS — M25661 Stiffness of right knee, not elsewhere classified: Secondary | ICD-10-CM | POA: Diagnosis not present

## 2018-02-06 DIAGNOSIS — R600 Localized edema: Secondary | ICD-10-CM

## 2018-02-06 NOTE — Therapy (Signed)
[facility] [place], [place], [address] Phone: [phone]   Fax:  [phone]  Physical Therapy Treatment  Patient Details  Name: Brittany Durham MRN: [id number] Date of Birth: November 08, 1970 Referring Provider (PT): Benjiman Core    Encounter Date: 02/06/2018  PT End of Session - 02/06/18 1537    Visit Number  4    Number of Visits  12    Date for PT Re-Evaluation  04/28/18    PT Start Time  8101    PT Stop Time  1612    PT Time Calculation (min)  57 min    Activity Tolerance  Patient tolerated treatment well    Behavior During Therapy  [facility] for tasks assessed/performed       Past Medical History:  Diagnosis Date  . Hypertension   . Knee pain    left meniscus tear s/p repair 11/2014  . Seizures ([place])   . Stuttering    patient reports this started after her knee surgery in 11/2014.   Marland Kitchen Thyroid nodule     Past Surgical History:  Procedure Laterality Date  . COLONOSCOPY N/A 08/23/2016   Procedure: COLONOSCOPY;  Surgeon: Daneil Dolin, MD;  [facility];  Service: Endoscopy;  Laterality: N/A;  1:00pm  . ESOPHAGOGASTRODUODENOSCOPY N/A 05/03/2016   Procedure: ESOPHAGOGASTRODUODENOSCOPY (EGD);  Surgeon: Daneil Dolin, MD;  [facility];  Service: Endoscopy;  Laterality: N/A;  . ESOPHAGOGASTRODUODENOSCOPY N/A 08/23/2016   Procedure: ESOPHAGOGASTRODUODENOSCOPY (EGD);  Surgeon: Daneil Dolin, MD;  [facility];  Service: Endoscopy;  Laterality: N/A;  . KNEE ARTHROSCOPY WITH MEDIAL MENISECTOMY Left 12/09/2014   Procedure: KNEE ARTHROSCOPY WITH PARTIAL MEDIAL AND LATERAL MENISECTOMY;  Surgeon: Sanjuana Kava, MD;  Location: [facility];  Service: Orthopedics;  Laterality: Left;  . TUBAL LIGATION    . WISDOM TOOTH EXTRACTION      There were no vitals filed for this visit.  Subjective Assessment - 02/06/18 1535    Subjective  Reports no pain and only occasional intermittant pain. Greatest complaint of  stiffness especially at night.    Pertinent History  Suttering; left knee surgery.    Limitations  Standing;Walking    How long can you stand comfortably?  10 minutes.    How long can you walk comfortably?  10 minutes.    Patient Stated Goals  Get out of pain.  Walk more.    Currently in Pain?  No/denies         [facility] PT Assessment - 02/06/18 0001      Assessment   Medical Diagnosis  S/p arthroscopy of right knee.    Onset Date/Surgical Date  12/02/17      Restrictions   Weight Bearing Restrictions  No      ROM / Strength   AROM / PROM / Strength  AROM      AROM   Overall AROM   Within functional limits for tasks performed    AROM Assessment Site  Knee    Right/Left Knee  Right    Right Knee Extension  0    Right Knee Flexion  127                   [facility] Adult PT Treatment/Exercise - 02/06/18 0001      Exercises   Exercises  Knee/Hip      Knee/Hip Exercises: Aerobic   Nustep  L5 x16 min      Knee/Hip Exercises: Standing  Forward Lunges  Right;2 sets;10 reps    Terminal Knee Extension  Strengthening;Right;20 reps;Theraband    Theraband Level (Terminal Knee Extension)  Level 3 (Green)    Forward Step Up  Right;2 sets;10 reps;Hand Hold: 2;Step Height: 6"    Rocker Board  2 minutes      Knee/Hip Exercises: Seated   Long Arc Quad  Strengthening;Right;3 sets;10 reps;Weights    Long Arc Quad Weight  3 lbs.      Knee/Hip Exercises: Supine   Bridges  Strengthening;20 reps    Straight Leg Raises  AROM;Right;20 reps      Modalities   Modalities  Vasopneumatic      Vasopneumatic   Number Minutes Vasopneumatic   15 minutes    Vasopnuematic Location   Knee    Vasopneumatic Pressure  Low    Vasopneumatic Temperature   60               PT Short Term Goals - 01/28/18 1727      PT SHORT TERM GOAL #1   Title  STG's=LTG's.        PT Long Term Goals - 02/06/18 1611      PT LONG TERM GOAL #1   Title  Ind with HEP.    Time  6    Period   Weeks    Status  On-going      PT LONG TERM GOAL #2   Title  Active right knee flexion to 120 degrees+ so the patient can perform functional tasks and do so with pain not > 2-3/10.    Time  6    Period  Weeks    Status  Achieved      PT LONG TERM GOAL #3   Title  Increase right knee strength to a solid 5/5 to provide good stability for accomplishment of functional activities    Time  6    Period  Weeks    Status  On-going      PT LONG TERM GOAL #4   Title  Patient perform a reciprocating stair gait with right knee pain not > 3/10.    Time  6    Period  Weeks    Status  On-going      PT LONG TERM GOAL #5   Title  Perform ADL's with pain not > 3/10.    Time  6    Period  Weeks    Status  Achieved            Plan - 02/06/18 1609    Clinical Impression Statement  Patient continues to progress well as she arrived with no current R knee pain. Patient reports stiffness but predominately at night and only intermittant minimal discomfort. Patient able to be progressed with strengthening exercises today and no pain complaints. AROM of R knee measured as 0-127 deg. Normal vasopneumatic response noted following removal of the modality.    Rehab Potential  Good    Clinical Impairments Affecting Rehab Potential  Ongoing persistent pain and edema.    PT Frequency  2x / week    PT Duration  6 weeks    PT Treatment/Interventions  ADLs/Self Care Home Management;Cryotherapy;Electrical Stimulation;Ultrasound;Therapeutic activities;Therapeutic exercise;Neuromuscular re-education;Patient/family education;Passive range of motion;Manual techniques;Vasopneumatic Device    PT Next Visit Plan  Continue to progress with R knee strengthening per MPT POC.    Consulted and Agree with Plan of Care  Patient       Patient will benefit from skilled therapeutic intervention  in order to improve the following deficits and impairments:  Abnormal gait, Decreased activity tolerance, Pain, Decreased range of  motion, Decreased strength, Increased edema  Visit Diagnosis: Acute pain of right knee  Stiffness of right knee, not elsewhere classified  Localized edema  Muscle weakness (generalized)     Problem List Patient Active Problem List   Diagnosis Date Noted  . Osteoarthritis 11/18/2017  . Insomnia 11/18/2017  . 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 Tear of lateral meniscus of right knee, current 10/28/2017  . 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 Morbid obesity ([place]) 04/30/2017  . Language difficulty 09/25/2016  . Nausea with vomiting 07/18/2016  . PUD (peptic ulcer disease) 07/18/2016  . Slurred speech   . GI bleed 05/02/2016  . Upper GI bleed 05/02/2016  . Sinus tachycardia 05/02/2016  . Acute blood loss anemia 05/02/2016  . Abdominal pain, epigastric 05/02/2016  . 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 Thyroid nodule 12/14/2014  . Left knee pain 12/14/2014  . Seizures ([place])   . Altered mental status 12/12/2014  . Vitamin D deficiency 06/30/2014  . GERD (gastroesophageal reflux disease) 06/29/2014  . SYNCOPE 03/02/2010  . HYPERTENSION, BENIGN 03/01/2010    Standley Brooking, PTA 02/06/2018, 4:14 PM  [facility] [place], [place], [address] Phone: [phone]   Fax:  [phone]  Name: Dalani Mette MRN: [id number] Date of Birth: Nov 05, 1970

## 2018-02-11 ENCOUNTER — Ambulatory Visit: Payer: Medicare Other | Attending: Surgery | Admitting: Physical Therapy

## 2018-02-11 ENCOUNTER — Encounter: Payer: Self-pay | Admitting: Physical Therapy

## 2018-02-11 DIAGNOSIS — M25561 Pain in right knee: Secondary | ICD-10-CM | POA: Diagnosis not present

## 2018-02-11 DIAGNOSIS — M25661 Stiffness of right knee, not elsewhere classified: Secondary | ICD-10-CM | POA: Insufficient documentation

## 2018-02-11 DIAGNOSIS — R600 Localized edema: Secondary | ICD-10-CM | POA: Insufficient documentation

## 2018-02-11 DIAGNOSIS — M6281 Muscle weakness (generalized): Secondary | ICD-10-CM | POA: Insufficient documentation

## 2018-02-11 NOTE — Therapy (Signed)
[facility] [place], [place], [address] Phone: [phone]   Fax:  [phone]  Physical Therapy Treatment  Patient Details  Name: Brittany Durham MRN: [id number] Date of Birth: 04/25/1971 Referring Provider (PT): Benjiman Core    Encounter Date: 02/11/2018  PT End of Session - 02/11/18 1519    Visit Number  5    Number of Visits  12    Date for PT Re-Evaluation  04/28/18    PT Start Time  1518    PT Stop Time  1610    PT Time Calculation (min)  52 min    Activity Tolerance  Patient tolerated treatment well    Behavior During Therapy  [facility] for tasks assessed/performed       Past Medical History:  Diagnosis Date  . Hypertension   . Knee pain    left meniscus tear s/p repair 11/2014  . Seizures ([place])   . Stuttering    patient reports this started after her knee surgery in 11/2014.   Marland Kitchen Thyroid nodule     Past Surgical History:  Procedure Laterality Date  . COLONOSCOPY N/A 08/23/2016   Procedure: COLONOSCOPY;  Surgeon: Daneil Dolin, MD;  [facility];  Service: Endoscopy;  Laterality: N/A;  1:00pm  . ESOPHAGOGASTRODUODENOSCOPY N/A 05/03/2016   Procedure: ESOPHAGOGASTRODUODENOSCOPY (EGD);  Surgeon: Daneil Dolin, MD;  [facility];  Service: Endoscopy;  Laterality: N/A;  . ESOPHAGOGASTRODUODENOSCOPY N/A 08/23/2016   Procedure: ESOPHAGOGASTRODUODENOSCOPY (EGD);  Surgeon: Daneil Dolin, MD;  [facility];  Service: Endoscopy;  Laterality: N/A;  . KNEE ARTHROSCOPY WITH MEDIAL MENISECTOMY Left 12/09/2014   Procedure: KNEE ARTHROSCOPY WITH PARTIAL MEDIAL AND LATERAL MENISECTOMY;  Surgeon: Sanjuana Kava, MD;  Location: [facility];  Service: Orthopedics;  Laterality: Left;  . TUBAL LIGATION    . WISDOM TOOTH EXTRACTION      There were no vitals filed for this visit.  Subjective Assessment - 02/11/18 1518    Subjective  Reports that she has no knee pain only waking up with migraine headache.     Pertinent History  Suttering; left knee surgery.    Limitations  Standing;Walking    How long can you stand comfortably?  10 minutes.    How long can you walk comfortably?  10 minutes.    Patient Stated Goals  Get out of pain.  Walk more.    Currently in Pain?  No/denies         [facility] PT Assessment - 02/11/18 0001      Assessment   Medical Diagnosis  S/p arthroscopy of right knee.    Onset Date/Surgical Date  12/02/17    Next MD Visit  02/20/2018      Restrictions   Weight Bearing Restrictions  No                   [facility] Adult PT Treatment/Exercise - 02/11/18 0001      Ambulation/Gait   Stairs  Yes    Stairs Assistance  7: Independent    Stair Management Technique  No rails;Alternating pattern;Forwards    Number of Stairs  4   x2 RT   Height of Stairs  6.5      Exercises   Exercises  Knee/Hip      Knee/Hip Exercises: Aerobic   Nustep  L5 x11 min      Knee/Hip Exercises: Machines for Strengthening   Cybex Knee Extension  10# 3x10 reps  Cybex Knee Flexion  30# 3x10 reps      Knee/Hip Exercises: Standing   Forward Lunges  Right;2 sets;10 reps    Terminal Knee Extension  Strengthening;Right;20 reps;Theraband    Terminal Knee Extension Limitations  Pink XTS    Hip Abduction  AROM;Right;2 sets;10 reps;Knee straight      Knee/Hip Exercises: Supine   Straight Leg Raises  AROM;Right;20 reps      Modalities   Modalities  Vasopneumatic      Vasopneumatic   Number Minutes Vasopneumatic   15 minutes    Vasopnuematic Location   Knee    Vasopneumatic Pressure  Low    Vasopneumatic Temperature   34               PT Short Term Goals - 01/28/18 1727      PT SHORT TERM GOAL #1   Title  STG's=LTG's.        PT Long Term Goals - 02/11/18 1705      PT LONG TERM GOAL #1   Title  Ind with HEP.    Time  6    Period  Weeks    Status  On-going      PT LONG TERM GOAL #2   Title  Active right knee flexion to 120 degrees+ so the patient can perform  functional tasks and do so with pain not > 2-3/10.    Time  6    Period  Weeks    Status  Achieved      PT LONG TERM GOAL #3   Title  Increase right knee strength to a solid 5/5 to provide good stability for accomplishment of functional activities    Time  6    Period  Weeks    Status  On-going      PT LONG TERM GOAL #4   Title  Patient perform a reciprocating stair gait with right knee pain not > 3/10.    Time  6    Period  Weeks    Status  Achieved      PT LONG TERM GOAL #5   Title  Perform ADL's with pain not > 3/10.    Time  6    Period  Weeks    Status  Achieved            Plan - 02/11/18 1658    Clinical Impression Statement  Patient presented in clinic today of no R knee pain. Patient progressed in regards of strengthening exercises with no complaints of pain. No difficulty observed with reciprical stair gait and no UE support. Patient did report experiencing tightness in posterior knee and calf region today. Normal vasopneumatic response noted following removal of the modality.    Rehab Potential  Good    Clinical Impairments Affecting Rehab Potential  Ongoing persistent pain and edema.    PT Frequency  2x / week    PT Duration  6 weeks    PT Treatment/Interventions  ADLs/Self Care Home Management;Cryotherapy;Electrical Stimulation;Ultrasound;Therapeutic activities;Therapeutic exercise;Neuromuscular re-education;Patient/family education;Passive range of motion;Manual techniques;Vasopneumatic Device    PT Next Visit Plan  Continue to progress with R knee strengthening per MPT POC.    Consulted and Agree with Plan of Care  Patient       Patient will benefit from skilled therapeutic intervention in order to improve the following deficits and impairments:  Abnormal gait, Decreased activity tolerance, Pain, Decreased range of motion, Decreased strength, Increased edema  Visit Diagnosis: Acute pain of right knee  Stiffness of right knee, not elsewhere  classified  Localized edema  Muscle weakness (generalized)     Problem List Patient Active Problem List   Diagnosis Date Noted  . Osteoarthritis 11/18/2017  . Insomnia 11/18/2017  . 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 Tear of lateral meniscus of right knee, current 10/28/2017  . 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 Morbid obesity ([place]) 04/30/2017  . Language difficulty 09/25/2016  . Nausea with vomiting 07/18/2016  . PUD (peptic ulcer disease) 07/18/2016  . Slurred speech   . GI bleed 05/02/2016  . Upper GI bleed 05/02/2016  . Sinus tachycardia 05/02/2016  . Acute blood loss anemia 05/02/2016  . Abdominal pain, epigastric 05/02/2016  . 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 Thyroid nodule 12/14/2014  . Left knee pain 12/14/2014  . Seizures ([place])   . Altered mental status 12/12/2014  . Vitamin D deficiency 06/30/2014  . GERD (gastroesophageal reflux disease) 06/29/2014  . SYNCOPE 03/02/2010  . HYPERTENSION, BENIGN 03/01/2010    Standley Brooking, PTA 02/11/2018, 5:05 PM  [facility] [place], [place], [address] Phone: [phone]   Fax:  [phone]  Name: Brittany Durham MRN: [id number] Date of Birth: January 25, 1971

## 2018-02-12 ENCOUNTER — Other Ambulatory Visit: Payer: Self-pay | Admitting: Family

## 2018-02-13 ENCOUNTER — Encounter: Payer: Medicare Other | Admitting: Physical Therapy

## 2018-02-18 ENCOUNTER — Ambulatory Visit: Payer: Medicare Other | Admitting: Physical Therapy

## 2018-02-18 ENCOUNTER — Encounter: Payer: Self-pay | Admitting: Physical Therapy

## 2018-02-18 DIAGNOSIS — M6281 Muscle weakness (generalized): Secondary | ICD-10-CM | POA: Diagnosis not present

## 2018-02-18 DIAGNOSIS — M25561 Pain in right knee: Secondary | ICD-10-CM

## 2018-02-18 DIAGNOSIS — R600 Localized edema: Secondary | ICD-10-CM | POA: Diagnosis not present

## 2018-02-18 DIAGNOSIS — M25661 Stiffness of right knee, not elsewhere classified: Secondary | ICD-10-CM | POA: Diagnosis not present

## 2018-02-18 NOTE — Therapy (Signed)
[facility] [place], [place], [address] Phone: [phone]   Fax:  [phone]  Physical Therapy Treatment  Patient Details  Name: Brittany Durham MRN: [id number] Date of Birth: 1970-11-07 Referring Provider (PT): Benjiman Core    Encounter Date: 02/18/2018  PT End of Session - 02/18/18 1515    Visit Number  6    Number of Visits  12    Date for PT Re-Evaluation  04/28/18    PT Start Time  4920    PT Stop Time  1557    PT Time Calculation (min)  42 min    Activity Tolerance  Patient tolerated treatment well    Behavior During Therapy  [facility] for tasks assessed/performed       Past Medical History:  Diagnosis Date  . Hypertension   . Knee pain    left meniscus tear s/p repair 11/2014  . Seizures ([place])   . Stuttering    patient reports this started after her knee surgery in 11/2014.   Marland Kitchen Thyroid nodule     Past Surgical History:  Procedure Laterality Date  . COLONOSCOPY N/A 08/23/2016   Procedure: COLONOSCOPY;  Surgeon: Daneil Dolin, MD;  [facility];  Service: Endoscopy;  Laterality: N/A;  1:00pm  . ESOPHAGOGASTRODUODENOSCOPY N/A 05/03/2016   Procedure: ESOPHAGOGASTRODUODENOSCOPY (EGD);  Surgeon: Daneil Dolin, MD;  [facility];  Service: Endoscopy;  Laterality: N/A;  . ESOPHAGOGASTRODUODENOSCOPY N/A 08/23/2016   Procedure: ESOPHAGOGASTRODUODENOSCOPY (EGD);  Surgeon: Daneil Dolin, MD;  [facility];  Service: Endoscopy;  Laterality: N/A;  . KNEE ARTHROSCOPY WITH MEDIAL MENISECTOMY Left 12/09/2014   Procedure: KNEE ARTHROSCOPY WITH PARTIAL MEDIAL AND LATERAL MENISECTOMY;  Surgeon: Sanjuana Kava, MD;  Location: [facility];  Service: Orthopedics;  Laterality: Left;  . TUBAL LIGATION    . WISDOM TOOTH EXTRACTION      There were no vitals filed for this visit.  Subjective Assessment - 02/18/18 1515    Subjective  Reports no pain just stiffness upon arrival. Reports her knee is still a little  swollen and stiff but no other problems.    Pertinent History  Suttering; left knee surgery.    Limitations  Standing;Walking    How long can you stand comfortably?  10 minutes.    How long can you walk comfortably?  10 minutes.    Patient Stated Goals  Get out of pain.  Walk more.    Currently in Pain?  No/denies         [facility] PT Assessment - 02/18/18 0001      Assessment   Medical Diagnosis  S/p arthroscopy of right knee.    Onset Date/Surgical Date  12/02/17    Next MD Visit  02/20/2018      Restrictions   Weight Bearing Restrictions  No                   [facility] Adult PT Treatment/Exercise - 02/18/18 0001      Exercises   Exercises  Knee/Hip      Knee/Hip Exercises: Aerobic   Stationary Bike  L2 x14 min      Knee/Hip Exercises: Machines for Strengthening   Cybex Knee Extension  10# 3x10 reps    Cybex Knee Flexion  30# 3x10 reps    Cybex Leg Press  2 pl, seat 6 x30 reps      Knee/Hip Exercises: Standing   Terminal Knee Extension  Strengthening;Right;3 sets;10 reps;Limitations  Terminal Knee Extension Limitations  Pink XTS      Modalities   Modalities  Vasopneumatic      Vasopneumatic   Number Minutes Vasopneumatic   15 minutes    Vasopnuematic Location   Knee    Vasopneumatic Pressure  Low    Vasopneumatic Temperature   72             PT Education - 02/18/18 1552    Education Details  HEP- SLR with ER, wall squat, TKE with green theraband    Person(s) Educated  Patient    Methods  Explanation;Demonstration;Handout    Comprehension  Verbalized understanding       PT Short Term Goals - 01/28/18 1727      PT SHORT TERM GOAL #1   Title  STG's=LTG's.        PT Long Term Goals - 02/11/18 1705      PT LONG TERM GOAL #1   Title  Ind with HEP.    Time  6    Period  Weeks    Status  On-going      PT LONG TERM GOAL #2   Title  Active right knee flexion to 120 degrees+ so the patient can perform functional tasks and do so with pain  not > 2-3/10.    Time  6    Period  Weeks    Status  Achieved      PT LONG TERM GOAL #3   Title  Increase right knee strength to a solid 5/5 to provide good stability for accomplishment of functional activities    Time  6    Period  Weeks    Status  On-going      PT LONG TERM GOAL #4   Title  Patient perform a reciprocating stair gait with right knee pain not > 3/10.    Time  6    Period  Weeks    Status  Achieved      PT LONG TERM GOAL #5   Title  Perform ADL's with pain not > 3/10.    Time  6    Period  Weeks    Status  Achieved            Plan - 02/18/18 1556    Clinical Impression Statement  Patient tolerated today's treatment well with only reports of stiffness upon arrival. Patient able to complete all exercises without complaint of pain. VCs and demo required to ensure proper technique with exercises. Patient provided new HEP with demo and VCs for education regarding parameters and technique. R knee MMT 4+/5 thus not meeting goal. Normal vasopneumatic response noted following removal of the modality.     Rehab Potential  Good    Clinical Impairments Affecting Rehab Potential  Ongoing persistent pain and edema.    PT Frequency  2x / week    PT Duration  6 weeks    PT Treatment/Interventions  ADLs/Self Care Home Management;Cryotherapy;Electrical Stimulation;Ultrasound;Therapeutic activities;Therapeutic exercise;Neuromuscular re-education;Patient/family education;Passive range of motion;Manual techniques;Vasopneumatic Device    PT Next Visit Plan  Continue to progress with R knee strengthening per MPT POC.    PT Home Exercise Plan  HEP- SLR with ER, wall squat, TKE with green theraband    Consulted and Agree with Plan of Care  Patient       Patient will benefit from skilled therapeutic intervention in order to improve the following deficits and impairments:  Abnormal gait, Decreased activity tolerance, Pain, Decreased range of motion, Decreased  strength, Increased  edema  Visit Diagnosis: Acute pain of right knee  Stiffness of right knee, not elsewhere classified  Localized edema  Muscle weakness (generalized)     Problem List Patient Active Problem List   Diagnosis Date Noted  . Osteoarthritis 11/18/2017  . Insomnia 11/18/2017  . 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 Tear of lateral meniscus of right knee, current 10/28/2017  . 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 Morbid obesity ([place]) 04/30/2017  . Language difficulty 09/25/2016  . Nausea with vomiting 07/18/2016  . PUD (peptic ulcer disease) 07/18/2016  . Slurred speech   . GI bleed 05/02/2016  . Upper GI bleed 05/02/2016  . Sinus tachycardia 05/02/2016  . Acute blood loss anemia 05/02/2016  . Abdominal pain, epigastric 05/02/2016  . 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 Thyroid nodule 12/14/2014  . Left knee pain 12/14/2014  . Seizures ([place])   . Altered mental status 12/12/2014  . Vitamin D deficiency 06/30/2014  . GERD (gastroesophageal reflux disease) 06/29/2014  . SYNCOPE 03/02/2010  . HYPERTENSION, BENIGN 03/01/2010    Standley Brooking, PTA 02/18/2018, 4:40 PM  [facility] [place], [place], [address] Phone: [phone]   Fax:  [phone]  Name: Thomasina Housley MRN: [id number] Date of Birth: Jun 03, 1970

## 2018-02-19 ENCOUNTER — Emergency Department (HOSPITAL_COMMUNITY)
Admission: EM | Admit: 2018-02-19 | Discharge: 2018-02-19 | Disposition: A | Discharge status: Home / Self Care | Payer: Medicare Other | Attending: Emergency Medicine | Admitting: Emergency Medicine

## 2018-02-19 ENCOUNTER — Emergency Department (HOSPITAL_COMMUNITY): Payer: Medicare Other

## 2018-02-19 ENCOUNTER — Other Ambulatory Visit: Payer: Self-pay

## 2018-02-19 ENCOUNTER — Encounter (HOSPITAL_COMMUNITY): Payer: Self-pay | Admitting: Emergency Medicine

## 2018-02-19 DIAGNOSIS — G9001 Carotid sinus syncope: Secondary | ICD-10-CM | POA: Diagnosis not present

## 2018-02-19 DIAGNOSIS — Z79899 Other long term (current) drug therapy: Secondary | ICD-10-CM | POA: Insufficient documentation

## 2018-02-19 DIAGNOSIS — I10 Essential (primary) hypertension: Secondary | ICD-10-CM | POA: Insufficient documentation

## 2018-02-19 DIAGNOSIS — R221 Localized swelling, mass and lump, neck: Secondary | ICD-10-CM | POA: Diagnosis not present

## 2018-02-19 LAB — BASIC METABOLIC PANEL
Anion gap: 9 (ref 5–15)
BUN: 16 mg/dL (ref 6–20)
CO2: 27 mmol/L (ref 22–32)
Calcium: 9.7 mg/dL (ref 8.9–10.3)
Chloride: 104 mmol/L (ref 98–111)
Creatinine, Ser: 0.69 mg/dL (ref 0.44–1.00)
GFR calc Af Amer: >60 mL/min (ref >60)
GFR calc non Af Amer: >60 mL/min (ref >60)
Glucose, Bld: 93 mg/dL (ref 70–99)
Potassium: 3.8 mmol/L (ref 3.5–5.1)
Sodium: 140 mmol/L (ref 135–145)

## 2018-02-19 LAB — CBC WITH DIFFERENTIAL/PLATELET
Abs Immature Granulocytes: 0.03 10*3/uL (ref 0.00–0.07)
Basophils Absolute: 0 10*3/uL (ref 0.0–0.1)
Basophils Relative: 0 %
Eosinophils Absolute: 0.1 10*3/uL (ref 0.0–0.5)
Eosinophils Relative: 1 %
HCT: 36.9 % (ref 36.0–46.0)
Hemoglobin: 11.6 g/dL — ABNORMAL LOW (ref 12.0–15.0)
Immature Granulocytes: 0 %
Lymphocytes Relative: 41 %
Lymphs Abs: 3.2 10*3/uL (ref 0.7–4.0)
MCH: 27.3 pg (ref 26.0–34.0)
MCHC: 31.4 g/dL (ref 30.0–36.0)
MCV: 86.8 fL (ref 80.0–100.0)
Monocytes Absolute: 0.6 10*3/uL (ref 0.1–1.0)
Monocytes Relative: 7 %
Neutro Abs: 3.8 10*3/uL (ref 1.7–7.7)
Neutrophils Relative %: 51 %
Platelets: 194 10*3/uL (ref 150–400)
RBC: 4.25 MIL/uL (ref 3.87–5.11)
RDW: 13.1 % (ref 11.5–15.5)
WBC: 7.6 10*3/uL (ref 4.0–10.5)
nRBC: 0 % (ref 0.0–0.2)

## 2018-02-19 MED ORDER — IOHEXOL 300 MG/ML  SOLN
75.0000 mL | Freq: Once | INTRAMUSCULAR | Status: AC | PRN
  Administered 2018-02-19: 75 mL via INTRAVENOUS

## 2018-02-19 MED ORDER — KETOROLAC TROMETHAMINE 30 MG/ML IJ SOLN
30.0000 mg | Freq: Once | INTRAMUSCULAR | Status: AC
  Administered 2018-02-19: 30 mg via INTRAVENOUS
  Filled 2018-02-19: qty 1

## 2018-02-19 MED ORDER — IBUPROFEN 400 MG PO TABS: 400.0000 mg | ORAL_TABLET | Freq: Three times a day (TID) | ORAL | 0 refills | Status: DC

## 2018-02-19 NOTE — ED Triage Notes (Signed)
Patient complaining of left jaw pain with opening and closing of mouth x 3 days. States pain started upon awakening on Monday and radiates into left shoulder.

## 2018-02-19 NOTE — ED Provider Notes (Signed)
[facility] EMERGENCY DEPARTMENT Provider Note   CSN: [id number] Arrival date & time: 02/19/18  1704     History   Chief Complaint Chief Complaint  Patient presents with  . Jaw Pain    HPI Brittany Durham is a 47 y.o. female.  HPI Complains of left anterior neck swelling and pain for the past 2 days.  Began gradually.  Patient states the pain is worse with chewing and swallowing, as well as palpation.  She denies any fever or chills.  Denies any chest pain or shortness of breath. Past Medical History:  Diagnosis Date  . Hypertension   . Knee pain    left meniscus tear s/p repair 11/2014  . Seizures ([place])   . Stuttering    patient reports this started after her knee surgery in 11/2014.   Marland Kitchen Thyroid nodule     Patient Active Problem List   Diagnosis Date Noted  . Osteoarthritis 11/18/2017  . Insomnia 11/18/2017  . 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 Tear of lateral meniscus of right knee, current 10/28/2017  . 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 Morbid obesity ([place]) 04/30/2017  . Language difficulty 09/25/2016  . Nausea with vomiting 07/18/2016  . PUD (peptic ulcer disease) 07/18/2016  . Slurred speech   . GI bleed 05/02/2016  . Upper GI bleed 05/02/2016  . Sinus tachycardia 05/02/2016  . Acute blood loss anemia 05/02/2016  . Abdominal pain, epigastric 05/02/2016  . 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 Thyroid nodule 12/14/2014  . Left knee pain 12/14/2014  . Seizures ([place])   . Altered mental status 12/12/2014  . Vitamin D deficiency 06/30/2014  . GERD (gastroesophageal reflux disease) 06/29/2014  . SYNCOPE 03/02/2010  . HYPERTENSION, BENIGN 03/01/2010    Past Surgical History:  Procedure Laterality Date  . COLONOSCOPY N/A 08/23/2016   Procedure: COLONOSCOPY;  Surgeon: Daneil Dolin, MD;  Location: [facility];  Service: Endoscopy;  Laterality: N/A;  1:00pm  . ESOPHAGOGASTRODUODENOSCOPY N/A 05/03/2016   Procedure:  ESOPHAGOGASTRODUODENOSCOPY (EGD);  Surgeon: Daneil Dolin, MD;  Location: [facility];  Service: Endoscopy;  Laterality: N/A;  . ESOPHAGOGASTRODUODENOSCOPY N/A 08/23/2016   Procedure: ESOPHAGOGASTRODUODENOSCOPY (EGD);  Surgeon: Daneil Dolin, MD;  Location: [facility];  Service: Endoscopy;  Laterality: N/A;  . KNEE ARTHROSCOPY WITH MEDIAL MENISECTOMY Left 12/09/2014   Procedure: KNEE ARTHROSCOPY WITH PARTIAL MEDIAL AND LATERAL MENISECTOMY;  Surgeon: Sanjuana Kava, MD;  Location: [facility] ORS;  Service: Orthopedics;  Laterality: Left;  . TUBAL LIGATION    . WISDOM TOOTH EXTRACTION       OB History    Gravida  1   Para  1   Term  1   Preterm      AB      Living  1     SAB      TAB      Ectopic      Multiple      Live Births               Home Medications    Prior to Admission medications   Medication Sig Start Date End Date Taking? Authorizing Provider  amLODipine (NORVASC) 10 MG tablet Take 1 tablet (10 mg total) by mouth daily. 04/30/17   Sharion Balloon, FNP  gabapentin (NEURONTIN) 300 MG capsule Take 1 capsule (300 mg total) by mouth 3 (three) times daily. 12/23/17   Sharion Balloon, FNP  HYDROcodone-acetaminophen (NORCO) 7.5-325 MG tablet Take 1 tablet by mouth  every 6 (six) hours as needed for moderate pain. 12/17/17   Lanae Crumbly, PA-C  ibuprofen (ADVIL,MOTRIN) 400 MG tablet Take 1 tablet (400 mg total) by mouth 3 (three) times daily after meals. 02/19/18   Julianne Rice, MD  lisinopril (PRINIVIL,ZESTRIL) 20 MG tablet TAKE 1 TABLET DAILY 01/16/18   Evelina Dun A, FNP  lisinopril (PRINIVIL,ZESTRIL) 20 MG tablet Take 1 tablet (20 mg total) by mouth daily. 01/16/18   Sharion Balloon, FNP  pantoprazole (PROTONIX) 40 MG tablet Take 1 tablet by mouth 2 (two) times daily before a meal. 11/20/17   Annitta Needs, NP  traMADol (ULTRAM) 50 MG tablet Take 1 tablet (50 mg total) by mouth every 6 (six) hours as needed. 10/22/17   Sharion Balloon, FNP  Vitamin D,  Ergocalciferol, (DRISDOL) 50000 units CAPS capsule TAKE 1 CAPSULE ONCE A WEEK 02/13/18   Sharion Balloon, FNP    Family History Family History  Problem Relation Age of Onset  . Hypertension Mother   . Hypertension Brother   . Colon cancer Neg Hx     Social History Social History   Tobacco Use  . Smoking status: Never Smoker  . Smokeless tobacco: Never Used  Substance Use Topics  . Alcohol use: No  . Drug use: No     Allergies   Patient has no known allergies.   Review of Systems Review of Systems  Constitutional: Negative for chills and fever.  HENT: Positive for trouble swallowing and voice change. Negative for dental problem, facial swelling and sore throat.   Eyes: Negative for visual disturbance.  Respiratory: Negative for shortness of breath.   Cardiovascular: Negative for chest pain.  Gastrointestinal: Negative for abdominal pain, diarrhea, nausea and vomiting.  Musculoskeletal: Positive for neck pain. Negative for back pain, myalgias and neck stiffness.  Skin: Negative for rash and wound.  Neurological: Negative for dizziness, weakness, numbness and headaches.  All other systems reviewed and are negative.    Physical Exam Updated Vital Signs BP 123/74 (BP Location: Right Arm)   Pulse 84   Temp 98.4 F (36.9 C) (Oral)   Resp 18   Ht 5\' 7"  (1.702 m)   Wt 98.9 kg   LMP 09/19/2017   SpO2 98%   BMI 34.14 kg/m   Physical Exam  Constitutional: She is oriented to person, place, and time. She appears well-developed and well-nourished. No distress.  HENT:  Head: Normocephalic and atraumatic.  Mouth/Throat: Oropharynx is clear and moist.  Poor dentition but no tenderness with palpation of the teeth.  No obvious salivary ductal stones or swelling.  Oropharynx is clear.  No gingival masses or areas of fluctuance.  Eyes: Pupils are equal, round, and reactive to light. EOM are normal.  Neck: Normal range of motion. Neck supple.  Patient with left-sided  submandibular mass that is tender to palpation.  No erythema or warmth.  No meningismus.  Cardiovascular: Normal rate and regular rhythm. Exam reveals no gallop and no friction rub.  No murmur heard. Pulmonary/Chest: Effort normal and breath sounds normal. No stridor. No respiratory distress. She has no wheezes. She has no rales. She exhibits no tenderness.  Abdominal: Soft. Bowel sounds are normal. There is no tenderness. There is no rebound and no guarding.  Musculoskeletal: Normal range of motion. She exhibits no edema or tenderness.  Neurological: She is alert and oriented to person, place, and time.  Skin: Skin is warm and dry. No rash noted. She is not diaphoretic. No erythema.  Psychiatric: She has a normal mood and affect. Her behavior is normal.  Nursing note and vitals reviewed.    ED Treatments / Results  Labs (all labs ordered are listed, but only abnormal results are displayed) Labs Reviewed  CBC WITH DIFFERENTIAL/PLATELET - Abnormal; Notable for the following components:      Result Value   Hemoglobin 11.6 (*)    All other components within normal limits  BASIC METABOLIC PANEL    EKG EKG Interpretation  Date/Time:  Wednesday February 19 2018 17:25:18 EDT Ventricular Rate:  98 PR Interval:  150 QRS Duration: 82 QT Interval:  342 QTC Calculation: 436 R Axis:   58 Text Interpretation:  Normal sinus rhythm Normal ECG Confirmed by Julianne Rice [phone]) on 02/19/2018 6:43:29 PM   Radiology Ct Soft Tissue Neck W Contrast  Result Date: 02/19/2018 CLINICAL DATA:  47 year old female with left jaw pain with TMJ range of motion for the past 3 days. Started upon wakening 2 days ago. Pain radiates to the left shoulder. Difficulty swallowing. EXAM: CT NECK WITH CONTRAST TECHNIQUE: Multidetector CT imaging of the neck was performed using the standard protocol following the bolus administration of intravenous contrast. CONTRAST:  40mL OMNIPAQUE IOHEXOL 300 MG/ML  SOLN  COMPARISON:  Brain MRI 05/04/2016. Head CTs without contrast 12/12/2014 and earlier. FINDINGS: Pharynx and larynx: The laryngeal and pharyngeal soft tissue contours are within normal limits. The superior parapharyngeal and retropharyngeal spaces appear normal. At the level of the left common carotid artery near the angle of the mandible there is mild parapharyngeal inflammation on the left, and perhaps trace retropharyngeal effusion. There is also asymmetric of the left piriform sinus near this level. See left carotid findings below. The right parapharyngeal space remains normal. Salivary glands: Negative sublingual space. Submandibular glands, parotid glands, submandibular and parotid spaces appear normal. Thyroid: Generalized mild thyromegaly. No discrete thyroid nodule or mass. Lymph nodes: Negative. Vascular: The major vascular structures in the neck and at the skull base are patent and generally appear normal. However, there is evidence of asymmetric inflammation along the distal left CCA as demonstrated on series 2, image 43 and coronal images 61 and 62. There is no associated vessel irregularity or discrete vessel wall thickening. No similar arterial abnormality elsewhere in the upper chest, neck, or at the skull base. The adjacent left IJ appears normal. There is no soft tissue fluid collection. Limited intracranial: Negative. Visualized orbits: Negative. Mastoids and visualized paranasal sinuses: Clear. Skeleton: The bilateral TMJ have a normal CT appearance. There is multilevel cervical spine disc and endplate degeneration. Up to mild degenerative cervical spinal stenosis at C5-C6. No acute osseous abnormality identified. Upper chest: Negative lung apices. Normal visible superior mediastinum. Normal visible axillary lymph nodes. IMPRESSION: 1. 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 No associated carotid luminal irregularity, and  no other arterial abnormality in the neck or visible chest. Minimal to mild associated left lower parapharyngeal and retropharyngeal space edema. 2. Otherwise negative neck soft tissues. 3. Multilevel cervical spine degeneration, including up to mild degenerative spinal stenosis at C5-C6. Normal CT appearance of the bilateral TMJ. Electronically Signed   By: Genevie Ann M.D.   On: 02/19/2018 21:22    Procedures Procedures (including critical care time)  Medications Ordered in ED Medications  iohexol (OMNIPAQUE) 300 MG/ML solution 75 mL (75 mLs Intravenous Contrast Given 02/19/18 2049)  ketorolac (TORADOL) 30 MG/ML injection 30 mg (30 mg Intravenous Given 02/19/18 2154)  Initial Impression / Assessment and Plan / ED Course  I have reviewed the triage vital signs and the nursing notes.  Pertinent labs & imaging results that were available during my care of the patient were reviewed by me and considered in my medical decision making (see chart for details).    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  Normal white blood cell count.  According to research of primary literature this appears to be a rare but benign and self-limiting condition.  Patient is well-appearing with no evidence of infectious process.  Will start on low-dose NSAIDs to take only after meals.  Understands the need to stop NSAID use if she has any upper abdominal discomfort.  Strict return precautions have been given.  Final Clinical Impressions(s) / ED Diagnoses   Final diagnoses:  Carotidynia    ED Discharge Orders         Ordered    ibuprofen (ADVIL,MOTRIN) 400 MG tablet  3 times daily after meals     02/19/18 2150           Julianne Rice, MD 02/20/18 1620

## 2018-02-20 ENCOUNTER — Ambulatory Visit (INDEPENDENT_AMBULATORY_CARE_PROVIDER_SITE_OTHER): Payer: Medicare Other | Admitting: Surgery

## 2018-02-25 ENCOUNTER — Ambulatory Visit: Payer: Medicare Other | Admitting: *Deleted

## 2018-02-25 ENCOUNTER — Encounter: Payer: Self-pay | Admitting: Family

## 2018-02-25 ENCOUNTER — Ambulatory Visit (INDEPENDENT_AMBULATORY_CARE_PROVIDER_SITE_OTHER): Payer: Medicare Other | Admitting: Family

## 2018-02-25 VITALS — BP 119/79 | HR 90 | Temp 96.8°F | Ht 67.0 in | Wt 204.8 lb

## 2018-02-25 DIAGNOSIS — G9001 Carotid sinus syncope: Secondary | ICD-10-CM | POA: Diagnosis not present

## 2018-02-25 DIAGNOSIS — R600 Localized edema: Secondary | ICD-10-CM | POA: Diagnosis not present

## 2018-02-25 DIAGNOSIS — M25561 Pain in right knee: Secondary | ICD-10-CM | POA: Diagnosis not present

## 2018-02-25 DIAGNOSIS — M25661 Stiffness of right knee, not elsewhere classified: Secondary | ICD-10-CM

## 2018-02-25 DIAGNOSIS — M6281 Muscle weakness (generalized): Secondary | ICD-10-CM | POA: Diagnosis not present

## 2018-02-25 DIAGNOSIS — Z09 Encounter for follow-up examination after completed treatment for conditions other than malignant neoplasm: Secondary | ICD-10-CM | POA: Diagnosis not present

## 2018-02-25 NOTE — Progress Notes (Signed)
   Subjective:    Patient ID: Brittany Durham, female    DOB: Sep 21, 1970, 47 y.o.   MRN: [id number]  Chief Complaint  Patient presents with  . emergency department visit    HPI PT presents to the office today for hospital follow up. She went to the ED on 02/19/18 with left jaw pain. She had a CT scan that showed a possible carotidynia. She was given ibuprofen. She states she never started the medications because she has had rectal bleeding before and was told to avoid NSAID's. She has been taking tylenol  Moderate relief.   She denies any pain in her jaw at this time, but has intermittent soreness of 1-2 out 10.    [facility] notes reviewed.    Review of Systems  All other systems reviewed and are negative.      Objective:   Physical Exam  Constitutional: She is oriented to person, place, and time. She appears well-developed and well-nourished. No distress.  HENT:  Head: Normocephalic and atraumatic.  Right Ear: External ear normal.  Left Ear: External ear normal.  Mouth/Throat: Oropharynx is clear and moist.  Eyes: Pupils are equal, round, and reactive to light.  Neck: Normal range of motion. Neck supple. No thyromegaly present.  Cardiovascular: Normal rate, regular rhythm, normal heart sounds and intact distal pulses.  No murmur heard. Pulmonary/Chest: Effort normal and breath sounds normal. No respiratory distress. She has no wheezes.  Abdominal: Soft. Bowel sounds are normal. She exhibits no distension. There is no tenderness.  Musculoskeletal: Normal range of motion. She exhibits no edema or tenderness.  Neurological: She is alert and oriented to person, place, and time. She has normal reflexes. No cranial nerve deficit.  Skin: Skin is warm and dry.  Psychiatric: She has a normal mood and affect. Her behavior is normal. Judgment and thought content normal.  Vitals reviewed.     BP 119/79   Pulse 90   Temp (!) 96.8 F (36 C) (Oral)   Ht 5\' 7"  (1.702 m)   Wt 204 lb  12.8 oz (92.9 kg)   BMI 32.08 kg/m      Assessment & Plan:  Brittany Durham comes in today with chief complaint of emergency department visit   Diagnosis and orders addressed:  1. Carotidynia  2. Hospital discharge follow-up  Labs reviewed Improving  Continue Tylenol as needed for pain Keep chronic follow up   Evelina Dun, FNP

## 2018-02-25 NOTE — Therapy (Signed)
[facility] [place], [place], [address] Phone: [phone]   Fax:  [phone]  Physical Therapy Treatment  Patient Details  Name: Brittany Durham MRN: [id number] Date of Birth: 07-16-1970 Referring Provider (PT): Benjiman Core    Encounter Date: 02/25/2018  PT End of Session - 02/25/18 1524    Visit Number  7    Number of Visits  12    Date for PT Re-Evaluation  04/28/18    PT Start Time  0737    PT Stop Time  1062    PT Time Calculation (min)  58 min       Past Medical History:  Diagnosis Date  . Hypertension   . Knee pain    left meniscus tear s/p repair 11/2014  . Seizures ([place])   . Stuttering    patient reports this started after her knee surgery in 11/2014.   Marland Kitchen Thyroid nodule     Past Surgical History:  Procedure Laterality Date  . COLONOSCOPY N/A 08/23/2016   Procedure: COLONOSCOPY;  Surgeon: Daneil Dolin, MD;  [facility];  Service: Endoscopy;  Laterality: N/A;  1:00pm  . ESOPHAGOGASTRODUODENOSCOPY N/A 05/03/2016   Procedure: ESOPHAGOGASTRODUODENOSCOPY (EGD);  Surgeon: Daneil Dolin, MD;  Location: [facility];  Service: Endoscopy;  Laterality: N/A;  . ESOPHAGOGASTRODUODENOSCOPY N/A 08/23/2016   Procedure: ESOPHAGOGASTRODUODENOSCOPY (EGD);  Surgeon: Daneil Dolin, MD;  Location: [facility];  Service: Endoscopy;  Laterality: N/A;  . KNEE ARTHROSCOPY WITH MEDIAL MENISECTOMY Left 12/09/2014   Procedure: KNEE ARTHROSCOPY WITH PARTIAL MEDIAL AND LATERAL MENISECTOMY;  Surgeon: Sanjuana Kava, MD;  Location: [facility];  Service: Orthopedics;  Laterality: Left;  . TUBAL LIGATION    . WISDOM TOOTH EXTRACTION      There were no vitals filed for this visit.  Subjective Assessment - 02/25/18 1518    Subjective  Did ok after last Rx    Pertinent History  Suttering; left knee surgery.    Limitations  Standing;Walking    How long can you stand comfortably?  10 minutes.    How long can you walk  comfortably?  10 minutes.    Patient Stated Goals  Get out of pain.  Walk more.    Currently in Pain?  No/denies    Pain Location  Knee    Pain Orientation  Left    Pain Descriptors / Indicators  Sore    Pain Type  Surgical pain    Pain Onset  More than a month ago                       [facility] Adult PT Treatment/Exercise - 02/25/18 0001      Exercises   Exercises  Knee/Hip      Knee/Hip Exercises: Aerobic   Stationary Bike  x 5 mins L3    Nustep  L4  LEs only  x10 min      Knee/Hip Exercises: Machines for Strengthening   Cybex Knee Extension  10# 3x10 reps    Cybex Knee Flexion  30# 3x10 reps    Cybex Leg Press  2 pl, seat 6 x30 reps      Knee/Hip Exercises: Standing   Terminal Knee Extension  2 sets;10 reps    Terminal Knee Extension Limitations  Pink XTS      Modalities   Modalities  Vasopneumatic      Vasopneumatic   Number Minutes Vasopneumatic   15 minutes  Vasopnuematic Location   Knee    Vasopneumatic Pressure  Low    Vasopneumatic Temperature   34               PT Short Term Goals - 01/28/18 1727      PT SHORT TERM GOAL #1   Title  STG's=LTG's.        PT Long Term Goals - 02/11/18 1705      PT LONG TERM GOAL #1   Title  Ind with HEP.    Time  6    Period  Weeks    Status  On-going      PT LONG TERM GOAL #2   Title  Active right knee flexion to 120 degrees+ so the patient can perform functional tasks and do so with pain not > 2-3/10.    Time  6    Period  Weeks    Status  Achieved      PT LONG TERM GOAL #3   Title  Increase right knee strength to a solid 5/5 to provide good stability for accomplishment of functional activities    Time  6    Period  Weeks    Status  On-going      PT LONG TERM GOAL #4   Title  Patient perform a reciprocating stair gait with right knee pain not > 3/10.    Time  6    Period  Weeks    Status  Achieved      PT LONG TERM GOAL #5   Title  Perform ADL's with pain not > 3/10.    Time  6     Period  Weeks    Status  Achieved            Plan - 02/25/18 1525    Clinical Impression Statement  Pt arrived today doing fairly well with mainly soreness in RT knee. She was able to perform therex today without pain complaints. She still requires verbal cues for technique and a demo for TKEs. Normal modality response.    Clinical Presentation  Stable    Clinical Decision Making  Low    Rehab Potential  Good    Clinical Impairments Affecting Rehab Potential  Ongoing persistent pain and edema.    PT Frequency  2x / week    PT Duration  6 weeks    PT Treatment/Interventions  ADLs/Self Care Home Management;Cryotherapy;Electrical Stimulation;Ultrasound;Therapeutic activities;Therapeutic exercise;Neuromuscular re-education;Patient/family education;Passive range of motion;Manual techniques;Vasopneumatic Device    PT Next Visit Plan  Continue to progress with R knee strengthening per MPT POC.    PT Home Exercise Plan  HEP- SLR with ER, wall squat, TKE with green theraband    Consulted and Agree with Plan of Care  Patient       Patient will benefit from skilled therapeutic intervention in order to improve the following deficits and impairments:  Abnormal gait, Decreased activity tolerance, Pain, Decreased range of motion, Decreased strength, Increased edema  Visit Diagnosis: Acute pain of right knee  Stiffness of right knee, not elsewhere classified  Localized edema  Muscle weakness (generalized)     Problem List Patient Active Problem List   Diagnosis Date Noted  . Osteoarthritis 11/18/2017  . Insomnia 11/18/2017  . 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 Tear of lateral meniscus of right knee, current 10/28/2017  . 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 Language difficulty 09/25/2016  . Nausea with vomiting 07/18/2016  . PUD (peptic ulcer disease) 07/18/2016  .  Slurred speech   . GI bleed 05/02/2016  . Upper GI bleed 05/02/2016  . Sinus  tachycardia 05/02/2016  . Acute blood loss anemia 05/02/2016  . Abdominal pain, epigastric 05/02/2016  . 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 Thyroid nodule 12/14/2014  . Left knee pain 12/14/2014  . Seizures ([place])   . Altered mental status 12/12/2014  . Vitamin D deficiency 06/30/2014  . GERD (gastroesophageal reflux disease) 06/29/2014  . SYNCOPE 03/02/2010  . HYPERTENSION, BENIGN 03/01/2010    RAMSEUR,CHRIS, PTA 02/25/2018, 4:43 PM  [facility] [place], [place], [address] Phone: [phone]   Fax:  [phone]  Name: Kellyann Ordway MRN: [id number] Date of Birth: May 24, 1970

## 2018-02-25 NOTE — Patient Instructions (Signed)

## 2018-02-27 ENCOUNTER — Ambulatory Visit: Payer: Medicare Other | Admitting: *Deleted

## 2018-02-27 DIAGNOSIS — M25661 Stiffness of right knee, not elsewhere classified: Secondary | ICD-10-CM | POA: Diagnosis not present

## 2018-02-27 DIAGNOSIS — M25561 Pain in right knee: Secondary | ICD-10-CM | POA: Diagnosis not present

## 2018-02-27 DIAGNOSIS — R600 Localized edema: Secondary | ICD-10-CM | POA: Diagnosis not present

## 2018-02-27 DIAGNOSIS — M6281 Muscle weakness (generalized): Secondary | ICD-10-CM | POA: Diagnosis not present

## 2018-02-27 NOTE — Therapy (Signed)
[facility] [place], [place], [address] Phone: [phone]   Fax:  [phone]  Physical Therapy Treatment  Patient Details  Name: Brittany [place] MRN: [id number] Date of Birth: [birth year] Referring Provider (PT): Benjiman Core    Encounter Date: 02/27/2018  PT End of Session - 02/27/18 1617    Visit Number  8    Number of Visits  12    Date for PT Re-Evaluation  04/28/18    PT Start Time  1520    PT Stop Time  1618    PT Time Calculation (min)  58 min       Past Medical History:  Diagnosis Date  . Hypertension   . Knee pain    left meniscus tear s/p repair 11/2014  . Seizures ([place])   . Stuttering    patient reports this started after her knee surgery in 11/2014.   Marland Kitchen Thyroid nodule     Past Surgical History:  Procedure Laterality Date  . COLONOSCOPY N/A 08/23/2016   Procedure: COLONOSCOPY;  Surgeon: Daneil Dolin, MD;  [facility];  Service: Endoscopy;  Laterality: N/A;  1:00pm  . ESOPHAGOGASTRODUODENOSCOPY N/A 05/03/2016   Procedure: ESOPHAGOGASTRODUODENOSCOPY (EGD);  Surgeon: Daneil Dolin, MD;  [facility];  Service: Endoscopy;  Laterality: N/A;  . ESOPHAGOGASTRODUODENOSCOPY N/A 08/23/2016   Procedure: ESOPHAGOGASTRODUODENOSCOPY (EGD);  Surgeon: Daneil Dolin, MD;  [facility];  Service: Endoscopy;  Laterality: N/A;  . KNEE ARTHROSCOPY WITH MEDIAL MENISECTOMY Left 12/09/2014   Procedure: KNEE ARTHROSCOPY WITH PARTIAL MEDIAL AND LATERAL MENISECTOMY;  Surgeon: Sanjuana Kava, MD;  Location: [facility];  Service: Orthopedics;  Laterality: Left;  . TUBAL LIGATION    . WISDOM TOOTH EXTRACTION      There were no vitals filed for this visit.  Subjective Assessment - 02/27/18 1535    Subjective  RT 2/10 soreness    Pertinent History  Suttering; left knee surgery.    Limitations  Standing;Walking    How long can you stand comfortably?  10 minutes.    How long can you walk comfortably?   10 minutes.    Patient Stated Goals  Get out of pain.  Walk more.    Currently in Pain?  Yes    Pain Score  2     Pain Location  Knee    Pain Orientation  Left    Pain Descriptors / Indicators  Sore    Pain Type  Surgical pain    Pain Onset  More than a month ago    Pain Frequency  Constant                       [facility] Adult PT Treatment/Exercise - 02/27/18 0001      Exercises   Exercises  Knee/Hip      Knee/Hip Exercises: Aerobic   Stationary Bike  x 7 mins L3    Nustep  L5 today  LEs only  x10 min      Knee/Hip Exercises: Machines for Strengthening   Cybex Knee Extension  10# 3x10 reps    Cybex Knee Flexion  40# 3x10 reps    Cybex Leg Press  2 pl, seat 6 x30 reps      Modalities   Modalities  Vasopneumatic      Vasopneumatic   Number Minutes Vasopneumatic   15 minutes    Vasopnuematic Location   Knee    Vasopneumatic Pressure  Low  Vasopneumatic Temperature   34               PT Short Term Goals - 01/28/18 1727      PT SHORT TERM GOAL #1   Title  STG's=LTG's.        PT Long Term Goals - 02/11/18 1705      PT LONG TERM GOAL #1   Title  Ind with HEP.    Time  6    Period  Weeks    Status  On-going      PT LONG TERM GOAL #2   Title  Active right knee flexion to 120 degrees+ so the patient can perform functional tasks and do so with pain not > 2-3/10.    Time  6    Period  Weeks    Status  Achieved      PT LONG TERM GOAL #3   Title  Increase right knee strength to a solid 5/5 to provide good stability for accomplishment of functional activities    Time  6    Period  Weeks    Status  On-going      PT LONG TERM GOAL #4   Title  Patient perform a reciprocating stair gait with right knee pain not > 3/10.    Time  6    Period  Weeks    Status  Achieved      PT LONG TERM GOAL #5   Title  Perform ADL's with pain not > 3/10.    Time  6    Period  Weeks    Status  Achieved            Plan - 02/27/18 1634    Clinical  Impression Statement  Pt arrived today doing very well with minimal pain and continues to progress with Therex. She was able to increase HS curl resistance to today with complaints. Pt reports fatigue at the end of Rx. All LTGs met except strength  due to mild deficit still 4/5. Normal Vaso response.     Clinical Presentation  Stable    Clinical Decision Making  Low    Rehab Potential  Good    Clinical Impairments Affecting Rehab Potential  Ongoing persistent pain and edema.    PT Frequency  2x / week    PT Duration  6 weeks    PT Treatment/Interventions  ADLs/Self Care Home Management;Cryotherapy;Electrical Stimulation;Ultrasound;Therapeutic activities;Therapeutic exercise;Neuromuscular re-education;Patient/family education;Passive range of motion;Manual techniques;Vasopneumatic Device    PT Next Visit Plan  Continue to progress with R knee strengthening per MPT POC.    PT Home Exercise Plan  HEP- SLR with ER, wall squat, TKE with green theraband    Consulted and Agree with Plan of Care  Patient       Patient will benefit from skilled therapeutic intervention in order to improve the following deficits and impairments:  Abnormal gait, Decreased activity tolerance, Pain, Decreased range of motion, Decreased strength, Increased edema  Visit Diagnosis: Acute pain of right knee  Stiffness of right knee, not elsewhere classified  Localized edema  Muscle weakness (generalized)     Problem List Patient Active Problem List   Diagnosis Date Noted  . Osteoarthritis 11/18/2017  . Insomnia 11/18/2017  . 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 Tear of lateral meniscus of right knee, current 10/28/2017  . 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 Language difficulty 09/25/2016  . Nausea with vomiting 07/18/2016  . PUD (peptic ulcer disease) 07/18/2016  .  Slurred speech   . GI bleed 05/02/2016  . Upper GI bleed 05/02/2016  . Sinus tachycardia 05/02/2016  . Acute blood  loss anemia 05/02/2016  . Abdominal pain, epigastric 05/02/2016  . 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 Thyroid nodule 12/14/2014  . Left knee pain 12/14/2014  . Seizures ([place])   . Altered mental status 12/12/2014  . Vitamin D deficiency 06/30/2014  . GERD (gastroesophageal reflux disease) 06/29/2014  . SYNCOPE 03/02/2010  . HYPERTENSION, BENIGN 03/01/2010    RAMSEUR,CHRIS, PTA 02/27/2018, 4:44 PM  [facility] [place], [place], [address] Phone: [phone]   Fax:  [phone]  Name: Brittany [place] MRN: [id number] Date of Birth: [birth year]

## 2018-03-04 ENCOUNTER — Ambulatory Visit: Payer: Medicare Other | Admitting: Physical Therapy

## 2018-03-04 ENCOUNTER — Encounter: Payer: Self-pay | Admitting: Physical Therapy

## 2018-03-04 DIAGNOSIS — R600 Localized edema: Secondary | ICD-10-CM | POA: Diagnosis not present

## 2018-03-04 DIAGNOSIS — M6281 Muscle weakness (generalized): Secondary | ICD-10-CM | POA: Diagnosis not present

## 2018-03-04 DIAGNOSIS — M25661 Stiffness of right knee, not elsewhere classified: Secondary | ICD-10-CM

## 2018-03-04 DIAGNOSIS — M25561 Pain in right knee: Secondary | ICD-10-CM

## 2018-03-04 NOTE — Therapy (Signed)
Br[place]any [place], [place], [address] Phone: [phone]   Fax:  [phone]  Physical Therapy Treatment  Patient Details  Name: Brittany [place] MRN: [id number] Date of Birth: [birth year] Referring Provider (PT): Benjiman Core    Encounter Date: 03/04/2018  PT End of Session - 03/04/18 1512    Visit Number  9    Number of Visits  12    Date for PT Re-Evaluation  04/28/18    PT Start Time  5277    PT Stop Time  1607    PT Time Calculation (min)  50 min    Activity Tolerance  Patient tolerated treatment well    Behavior During Therapy  [facility] for tasks assessed/performed       Past Medical History:  Diagnosis Date  . Hypertension   . Knee pain    left meniscus tear s/p repair 11/2014  . Seizures ([place])   . Stuttering    patient reports this started after her knee surgery in 11/2014.   Marland Kitchen Thyroid nodule     Past Surgical History:  Procedure Laterality Date  . COLONOSCOPY N/A 08/23/2016   Procedure: COLONOSCOPY;  Surgeon: Daneil Dolin, MD;  [facility];  Service: Endoscopy;  Laterality: N/A;  1:00pm  . ESOPHAGOGASTRODUODENOSCOPY N/A 05/03/2016   Procedure: ESOPHAGOGASTRODUODENOSCOPY (EGD);  Surgeon: Daneil Dolin, MD;  [facility];  Service: Endoscopy;  Laterality: N/A;  . ESOPHAGOGASTRODUODENOSCOPY N/A 08/23/2016   Procedure: ESOPHAGOGASTRODUODENOSCOPY (EGD);  Surgeon: Daneil Dolin, MD;  [facility];  Service: Endoscopy;  Laterality: N/A;  . KNEE ARTHROSCOPY WITH MEDIAL MENISECTOMY Left 12/09/2014   Procedure: KNEE ARTHROSCOPY WITH PARTIAL MEDIAL AND LATERAL MENISECTOMY;  Surgeon: Sanjuana Kava, MD;  [facility]: [facility];  Service: Orthopedics;  Laterality: Left;  . TUBAL LIGATION    . WISDOM TOOTH EXTRACTION      There were no vitals filed for this visit.  Subjective Assessment - 03/04/18 1512    Subjective  Reports greater discomfort but correlates to the weather.    Pertinent  History  Suttering; left knee surgery.    Limitations  Standing;Walking    How long can you stand comfortably?  10 minutes.    How long can you walk comfortably?  10 minutes.    Patient Stated Goals  Get out of pain.  Walk more.    Currently in Pain?  Yes    Pain Score  5     Pain Location  Knee    Pain Orientation  Left    Pain Descriptors / Indicators  Discomfort    Pain Type  Surgical pain    Pain Onset  More than a month ago         [facility] PT Assessment - 03/04/18 0001      Assessment   Medical Diagnosis  S/p arthroscopy of right knee.    Onset Date/Surgical Date  12/02/17    Next MD Visit  03/11/2018      Restrictions   Weight Bearing Restrictions  No                   [facility] Adult PT Treatment/Exercise - 03/04/18 0001      Exercises   Exercises  Knee/Hip      Knee/Hip Exercises: Aerobic   Stationary Bike  L4 x10 min      Knee/Hip Exercises: Machines for Strengthening   Cybex Knee Extension  20# 3x10 reps    Cybex Knee  Flexion  40# 3x10 reps    Cybex Leg Press  3 pl, seat 6 x30 reps      Knee/Hip Exercises: Standing   Terminal Knee Extension  2 sets;10 reps    Terminal Knee Extension Limitations  Pink XTS    Step Down  Right;2 sets;10 reps;Hand Hold: 2;Step Height: 4"    Step Down Limitations  Heel dot      Modalities   Modalities  Vasopneumatic      Vasopneumatic   Number Minutes Vasopneumatic   10 minutes    Vasopnuematic Location   Knee    Vasopneumatic Pressure  Low    Vasopneumatic Temperature   34               PT Short Term Goals - 01/28/18 1727      PT SHORT TERM GOAL #1   Title  STG's=LTG's.        PT Long Term Goals - 02/11/18 1705      PT LONG TERM GOAL #1   Title  Ind with HEP.    Time  6    Period  Weeks    Status  On-going      PT LONG TERM GOAL #2   Title  Active right knee flexion to 120 degrees+ so the patient can perform functional tasks and do so with pain not > 2-3/10.    Time  6    Period  Weeks     Status  Achieved      PT LONG TERM GOAL #3   Title  Increase right knee strength to a solid 5/5 to provide good stability for accomplishment of functional activities    Time  6    Period  Weeks    Status  On-going      PT LONG TERM GOAL #4   Title  Patient perform a reciprocating stair gait with right knee pain not > 3/10.    Time  6    Period  Weeks    Status  Achieved      PT LONG TERM GOAL #5   Title  Perform ADL's with pain not > 3/10.    Time  6    Period  Weeks    Status  Achieved            Plan - 03/04/18 1605    Clinical Impression Statement  Patient tolerated today's treatment well with R knee discomfort to which she correlated to the weather. Patient guided through increased resistance and strengthening exercises without complaint of pain. Patient able to feel muscle burn with R heel dot but no reports of discomfort. Normal vasopneumatic response noted following removal of the modalities.    Rehab Potential  Good    Clinical Impairments Affecting Rehab Potential  Ongoing persistent pain and edema.    PT Frequency  2x / week    PT Duration  6 weeks    PT Treatment/Interventions  ADLs/Self Care Home Management;Cryotherapy;Electrical Stimulation;Ultrasound;Therapeutic activities;Therapeutic exercise;Neuromuscular re-education;Patient/family education;Passive range of motion;Manual techniques;Vasopneumatic Device    PT Next Visit Plan  Continue to progress with R knee strengthening per MPT POC. MD note required on 03/06/2018.    PT Home Exercise Plan  HEP- SLR with ER, wall squat, TKE with green theraband    Consulted and Agree with Plan of Care  Patient       Patient will benefit from skilled therapeutic intervention in order to improve the following deficits and impairments:  Abnormal gait, Decreased  activity tolerance, Pain, Decreased range of motion, Decreased strength, Increased edema  Visit Diagnosis: Acute pain of right knee  Stiffness of right knee, not  elsewhere classified  Localized edema  Muscle weakness (generalized)     Problem List Patient Active Problem List   Diagnosis Date Noted  . Osteoarthritis 11/18/2017  . Insomnia 11/18/2017  . 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 Tear of lateral meniscus of right knee, current 10/28/2017  . 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 Language difficulty 09/25/2016  . Nausea with vomiting 07/18/2016  . PUD (peptic ulcer disease) 07/18/2016  . Slurred speech   . GI bleed 05/02/2016  . Upper GI bleed 05/02/2016  . Sinus tachycardia 05/02/2016  . Acute blood loss anemia 05/02/2016  . Abdominal pain, epigastric 05/02/2016  . 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 Thyroid nodule 12/14/2014  . Left knee pain 12/14/2014  . Seizures ([place])   . Altered mental status 12/12/2014  . Vitamin D deficiency 06/30/2014  . GERD (gastroesophageal reflux disease) 06/29/2014  . SYNCOPE 03/02/2010  . HYPERTENSION, BENIGN 03/01/2010    Standley Brooking, PTA 03/04/2018, 5:31 PM  [facility] [place], [place], [address] Phone: [phone]   Fax:  [phone]  Name: Alishea Beaudin MRN: [id number] Date of Birth: 04-15-1971

## 2018-03-06 ENCOUNTER — Encounter: Payer: Self-pay | Admitting: Physical Therapy

## 2018-03-06 ENCOUNTER — Ambulatory Visit: Payer: Medicare Other | Admitting: Physical Therapy

## 2018-03-06 DIAGNOSIS — R600 Localized edema: Secondary | ICD-10-CM | POA: Diagnosis not present

## 2018-03-06 DIAGNOSIS — M25661 Stiffness of right knee, not elsewhere classified: Secondary | ICD-10-CM | POA: Diagnosis not present

## 2018-03-06 DIAGNOSIS — M6281 Muscle weakness (generalized): Secondary | ICD-10-CM | POA: Diagnosis not present

## 2018-03-06 DIAGNOSIS — M25561 Pain in right knee: Secondary | ICD-10-CM | POA: Diagnosis not present

## 2018-03-06 NOTE — Therapy (Signed)
[facility] [place], [place], [address] Phone: [phone]   Fax:  [phone]  Physical Therapy Treatment  Patient Details  Name: Brittany [place] MRN: [id number] Date of Birth: [birth year] Referring Provider (PT): Benjiman Core    Encounter Date: 03/06/2018  PT End of Session - 03/06/18 1517    Visit Number  10    Number of Visits  12    Date for PT Re-Evaluation  04/28/18    PT Start Time  6144    PT Stop Time  1605    PT Time Calculation (min)  50 min    Activity Tolerance  Patient tolerated treatment well    Behavior During Therapy  [facility] for tasks assessed/performed       Past Medical History:  Diagnosis Date  . Hypertension   . Knee pain    left meniscus tear s/p repair 11/2014  . Seizures ([place])   . Stuttering    patient reports this started after her knee surgery in 11/2014.   Marland Kitchen Thyroid nodule     Past Surgical History:  Procedure Laterality Date  . COLONOSCOPY N/A 08/23/2016   Procedure: COLONOSCOPY;  Surgeon: Daneil Dolin, MD;  [facility];  Service: Endoscopy;  Laterality: N/A;  1:00pm  . ESOPHAGOGASTRODUODENOSCOPY N/A 05/03/2016   Procedure: ESOPHAGOGASTRODUODENOSCOPY (EGD);  Surgeon: Daneil Dolin, MD;  [facility];  Service: Endoscopy;  Laterality: N/A;  . ESOPHAGOGASTRODUODENOSCOPY N/A 08/23/2016   Procedure: ESOPHAGOGASTRODUODENOSCOPY (EGD);  Surgeon: Daneil Dolin, MD;  [facility];  Service: Endoscopy;  Laterality: N/A;  . KNEE ARTHROSCOPY WITH MEDIAL MENISECTOMY Left 12/09/2014   Procedure: KNEE ARTHROSCOPY WITH PARTIAL MEDIAL AND LATERAL MENISECTOMY;  Surgeon: Sanjuana Kava, MD;  Location: [facility];  Service: Orthopedics;  Laterality: Left;  . TUBAL LIGATION    . WISDOM TOOTH EXTRACTION      There were no vitals filed for this visit.  Subjective Assessment - 03/06/18 1516    Subjective  Denies any pain with the better weather.    Pertinent History  Suttering;  left knee surgery.    Limitations  Standing;Walking    How long can you stand comfortably?  10 minutes.    How long can you walk comfortably?  10 minutes.    Patient Stated Goals  Get out of pain.  Walk more.    Currently in Pain?  No/denies         [facility] PT Assessment - 03/06/18 0001      Assessment   Medical Diagnosis  S/p arthroscopy of right knee.    Onset Date/Surgical Date  12/02/17    Next MD Visit  03/11/2018      Restrictions   Weight Bearing Restrictions  No      Observation/Other Assessments-Edema    Edema  Circumferential      Circumferential Edema   Circumferential - Right  44 cm    Circumferential - Left   42 cm                   [facility] Adult PT Treatment/Exercise - 03/06/18 0001      Exercises   Exercises  Knee/Hip      Knee/Hip Exercises: Aerobic   Stationary Bike  L4 x10 min      Knee/Hip Exercises: Machines for Strengthening   Cybex Knee Extension  20# 3x10 reps    Cybex Knee Flexion  40# 4x10 reps    Cybex Leg Press  3  pl, seat 6 x30 reps      Knee/Hip Exercises: Standing   Terminal Knee Extension  2 sets;10 reps    Terminal Knee Extension Limitations  Pink XTS    Step Down  Right;10 reps;Hand Hold: 2;Step Height: 4"   stopped secondary to pain   Step Down Limitations  Heel dot    Walking with Sports Cord  Pink XTS lateral walking to both sides x5 reps each      Modalities   Modalities  Vasopneumatic      Vasopneumatic   Number Minutes Vasopneumatic   10 minutes    Vasopnuematic Location   Knee    Vasopneumatic Pressure  Low    Vasopneumatic Temperature   34               PT Short Term Goals - 01/28/18 1727      PT SHORT TERM GOAL #1   Title  STG's=LTG's.        PT Long Term Goals - 02/11/18 1705      PT LONG TERM GOAL #1   Title  Ind with HEP.    Time  6    Period  Weeks    Status  On-going      PT LONG TERM GOAL #2   Title  Active right knee flexion to 120 degrees+ so the patient can perform  functional tasks and do so with pain not > 2-3/10.    Time  6    Period  Weeks    Status  Achieved      PT LONG TERM GOAL #3   Title  Increase right knee strength to a solid 5/5 to provide good stability for accomplishment of functional activities    Time  6    Period  Weeks    Status  On-going      PT LONG TERM GOAL #4   Title  Patient perform a reciprocating stair gait with right knee pain not > 3/10.    Time  6    Period  Weeks    Status  Achieved      PT LONG TERM GOAL #5   Title  Perform ADL's with pain not > 3/10.    Time  6    Period  Weeks    Status  Achieved            Plan - 03/06/18 1603    Clinical Impression Statement  Patient tolerated today's treatment fairly well as she arrived with no R knee pain. Patient able to complete exercises well with resistance but limited with R heel dot. Patient reported sharp pain along lateral R knee during heel dot and reported tightness along lateral R knee as well. Patient also presented with increased edema with 2 cm difference R > L. No abnormal tightness along R ITB palpable but minimally increased tightness palpable with knee flexion. Normal vasopneumatic response noted following removal of the modalities.    Rehab Potential  Good    Clinical Impairments Affecting Rehab Potential  Ongoing persistent pain and edema.    PT Frequency  2x / week    PT Duration  6 weeks    PT Treatment/Interventions  ADLs/Self Care Home Management;Cryotherapy;Electrical Stimulation;Ultrasound;Therapeutic activities;Therapeutic exercise;Neuromuscular re-education;Patient/family education;Passive range of motion;Manual techniques;Vasopneumatic Device    PT Next Visit Plan  Continue to progress with R knee strengthening per MPT POC. MD note required on 03/06/2018.    PT Home Exercise Plan  HEP- SLR with ER, wall squat,  TKE with green theraband    Consulted and Agree with Plan of Care  Patient       Patient will benefit from skilled therapeutic  intervention in order to improve the following deficits and impairments:  Abnormal gait, Decreased activity tolerance, Pain, Decreased range of motion, Decreased strength, Increased edema  Visit Diagnosis: Acute pain of right knee  Stiffness of right knee, not elsewhere classified  Localized edema  Muscle weakness (generalized)     Problem List Patient Active Problem List   Diagnosis Date Noted  . Osteoarthritis 11/18/2017  . Insomnia 11/18/2017  . 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 Tear of lateral meniscus of right knee, current 10/28/2017  . 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 Language difficulty 09/25/2016  . Nausea with vomiting 07/18/2016  . PUD (peptic ulcer disease) 07/18/2016  . Slurred speech   . GI bleed 05/02/2016  . Upper GI bleed 05/02/2016  . Sinus tachycardia 05/02/2016  . Acute blood loss anemia 05/02/2016  . Abdominal pain, epigastric 05/02/2016  . 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 Thyroid nodule 12/14/2014  . Left knee pain 12/14/2014  . Seizures ([place])   . Altered mental status 12/12/2014  . Vitamin D deficiency 06/30/2014  . GERD (gastroesophageal reflux disease) 06/29/2014  . SYNCOPE 03/02/2010  . HYPERTENSION, BENIGN 03/01/2010    Standley Brooking, PTA 03/06/2018, 4:07 PM  [facility] [place], [place], [address] Phone: [phone]   Fax:  [phone]  Name: Presleigh Feldstein MRN: [id number] Date of Birth: [birth year]  Progress Note Reporting Period 01/28/18 to 03/06/18  See note below for Objective Data and Assessment of Progress/Goals. Goals #2, 4 and 5 met.  Excellent progress thus far.    Mali Applegate MPT

## 2018-03-11 ENCOUNTER — Ambulatory Visit (INDEPENDENT_AMBULATORY_CARE_PROVIDER_SITE_OTHER): Payer: Medicare Other | Admitting: Orthopaedic Surgery

## 2018-03-13 ENCOUNTER — Encounter: Payer: Self-pay | Admitting: Physical Therapy

## 2018-03-13 ENCOUNTER — Ambulatory Visit: Payer: Medicare Other | Admitting: Physical Therapy

## 2018-03-13 DIAGNOSIS — R600 Localized edema: Secondary | ICD-10-CM

## 2018-03-13 DIAGNOSIS — M25661 Stiffness of right knee, not elsewhere classified: Secondary | ICD-10-CM | POA: Diagnosis not present

## 2018-03-13 DIAGNOSIS — M6281 Muscle weakness (generalized): Secondary | ICD-10-CM | POA: Diagnosis not present

## 2018-03-13 DIAGNOSIS — M25561 Pain in right knee: Secondary | ICD-10-CM

## 2018-03-13 NOTE — Therapy (Addendum)
Brittany Durham, [place], [address] Phone: [phone]   Fax:  [phone]  Physical Therapy Treatment PHYSICAL THERAPY DISCHARGE SUMMARY  Visits from Start of Care: 11  Current functional level related to goals / functional outcomes: See below   Remaining deficits: Partially met   Education / Equipment: HEP Plan: Patient agrees to discharge.  Patient goals were partially met. Patient is being discharged due to not returning since the last visit.  ?????    Brittany Durham, PT, DPT 06/25/18   Patient Details  Name: Brittany Durham MRN: [id number] Date of Birth: 01/29/1971 Referring Provider (PT): Benjiman Core    Encounter Date: 03/13/2018  PT End of Session - 03/13/18 1517    Visit Number  11    Number of Visits  12    Date for PT Re-Evaluation  04/28/18    PT Start Time  2637    PT Stop Time  1605    PT Time Calculation (min)  50 min    Activity Tolerance  Patient tolerated treatment well    Behavior During Therapy  [facility] for tasks assessed/performed       Past Medical History:  Diagnosis Date  . Hypertension   . Knee pain    left meniscus tear s/p repair 11/2014  . Seizures ([place])   . Stuttering    patient reports this started after her knee surgery in 11/2014.   Marland Kitchen Thyroid nodule     Past Surgical History:  Procedure Laterality Date  . COLONOSCOPY N/A 08/23/2016   Procedure: COLONOSCOPY;  Surgeon: Daneil Dolin, MD;  [facility];  Service: Endoscopy;  Laterality: N/A;  1:00pm  . ESOPHAGOGASTRODUODENOSCOPY N/A 05/03/2016   Procedure: ESOPHAGOGASTRODUODENOSCOPY (EGD);  Surgeon: Daneil Dolin, MD;  [facility];  Service: Endoscopy;  Laterality: N/A;  . ESOPHAGOGASTRODUODENOSCOPY N/A 08/23/2016   Procedure: ESOPHAGOGASTRODUODENOSCOPY (EGD);  Surgeon: Daneil Dolin, MD;  [facility];  Service: Endoscopy;  Laterality: N/A;  . KNEE ARTHROSCOPY WITH MEDIAL MENISECTOMY  Left 12/09/2014   Procedure: KNEE ARTHROSCOPY WITH PARTIAL MEDIAL AND LATERAL MENISECTOMY;  Surgeon: Sanjuana Kava, MD;  Location: [facility];  Service: Orthopedics;  Laterality: Left;  . TUBAL LIGATION    . WISDOM TOOTH EXTRACTION      There were no vitals filed for this visit.  Subjective Assessment - 03/13/18 1517    Subjective  Had to reschedule MD appointment this past 07/26/2022 to April 03, 2018 due to death in the family.     Pertinent History  Suttering; left knee surgery.    Limitations  Standing;Walking    How long can you stand comfortably?  10 minutes.    How long can you walk comfortably?  10 minutes.    Patient Stated Goals  Get out of pain.  Walk more.    Currently in Pain?  Yes    Pain Score  4     Pain Location  Knee    Pain Orientation  Left    Pain Descriptors / Indicators  Sore    Pain Type  Surgical pain    Pain Onset  More than a month ago         [facility] PT Assessment - 03/13/18 0001      Assessment   Medical Diagnosis  S/p arthroscopy of right knee.    Onset Date/Surgical Date  12/02/17    Next MD Visit  04/03/18      Restrictions   Weight Bearing Restrictions  No                   [facility] Adult PT Treatment/Exercise - 03/13/18 0001      Knee/Hip Exercises: Aerobic   Stationary Bike  L4 x10 min      Knee/Hip Exercises: Machines for Strengthening   Cybex Knee Extension  20# 4x10 reps    Cybex Knee Flexion  40# 4x10 reps    Cybex Leg Press  3 pl, seat 6 x30 reps      Knee/Hip Exercises: Standing   Terminal Knee Extension  2 sets;10 reps    Terminal Knee Extension Limitations  Orange XTS    Walking with Sports Cord  Pink XTS lateral, posterior walking to both sides x6 reps each      Modalities   Modalities  Vasopneumatic      Vasopneumatic   Number Minutes Vasopneumatic   10 minutes    Vasopnuematic Location   Knee    Vasopneumatic Pressure  Low    Vasopneumatic Temperature   34               PT Short Term Goals - 01/28/18 1727       PT SHORT TERM GOAL #1   Title  STG's=LTG's.        PT Long Term Goals - 02/11/18 1705      PT LONG TERM GOAL #1   Title  Ind with HEP.    Time  6    Period  Weeks    Status  On-going      PT LONG TERM GOAL #2   Title  Active right knee flexion to 120 degrees+ so the patient can perform functional tasks and do so with pain not > 2-3/10.    Time  6    Period  Weeks    Status  Achieved      PT LONG TERM GOAL #3   Title  Increase right knee strength to a solid 5/5 to provide good stability for accomplishment of functional activities    Time  6    Period  Weeks    Status  On-going      PT LONG TERM GOAL #4   Title  Patient perform a reciprocating stair gait with right knee pain not > 3/10.    Time  6    Period  Weeks    Status  Achieved      PT LONG TERM GOAL #5   Title  Perform ADL's with pain not > 3/10.    Time  6    Period  Weeks    Status  Achieved            Plan - 03/13/18 1611    Clinical Impression Statement  Patient tolerated today's treatment well with only low reports of R knee pain due to weather. Patient able to complete all exercises as directed without complaint of any increased pain. No complaints of any limitations at home. Normal vasopneumatic response noted following removal to assist with reduced pain.    Rehab Potential  Good    Clinical Impairments Affecting Rehab Potential  Ongoing persistent pain and edema.    PT Frequency  2x / week    PT Duration  6 weeks    PT Treatment/Interventions  ADLs/Self Care Home Management;Cryotherapy;Electrical Stimulation;Ultrasound;Therapeutic activities;Therapeutic exercise;Neuromuscular re-education;Patient/family education;Passive range of motion;Manual techniques;Vasopneumatic Device    PT Next Visit Plan  Continue to progress with R knee strengthening per MPT POC. MD note  required on 03/06/2018.    PT Home Exercise Plan  HEP- SLR with ER, wall squat, TKE with green theraband    Consulted and Agree  with Plan of Care  Patient       Patient will benefit from skilled therapeutic intervention in order to improve the following deficits and impairments:  Abnormal gait, Decreased activity tolerance, Pain, Decreased range of motion, Decreased strength, Increased edema  Visit Diagnosis: Acute pain of right knee  Stiffness of right knee, not elsewhere classified  Localized edema  Muscle weakness (generalized)     Problem List Patient Active Problem List   Diagnosis Date Noted  . Osteoarthritis 11/18/2017  . Insomnia 11/18/2017  . 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 Tear of lateral meniscus of right knee, current 10/28/2017  . 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 Language difficulty 09/25/2016  . Nausea with vomiting 07/18/2016  . PUD (peptic ulcer disease) 07/18/2016  . Slurred speech   . GI bleed 05/02/2016  . Upper GI bleed 05/02/2016  . Sinus tachycardia 05/02/2016  . Acute blood loss anemia 05/02/2016  . Abdominal pain, epigastric 05/02/2016  . 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 Thyroid nodule 12/14/2014  . Left knee pain 12/14/2014  . Seizures ([place])   . Altered mental status 12/12/2014  . Vitamin D deficiency 06/30/2014  . GERD (gastroesophageal reflux disease) 06/29/2014  . SYNCOPE 03/02/2010  . HYPERTENSION, BENIGN 03/01/2010    Standley Brooking, PTA 03/13/2018, 4:25 PM  [facility] [address] [place], [place], [address] Phone: [phone]   Fax:  [phone]  Name: Donnalyn Juran MRN: [id number] Date of Birth: 01-30-1971

## 2018-03-18 ENCOUNTER — Encounter (INDEPENDENT_AMBULATORY_CARE_PROVIDER_SITE_OTHER): Payer: Self-pay | Admitting: Orthopaedic Surgery

## 2018-03-18 ENCOUNTER — Ambulatory Visit (INDEPENDENT_AMBULATORY_CARE_PROVIDER_SITE_OTHER): Payer: Medicare Other | Admitting: Orthopaedic Surgery

## 2018-03-18 VITALS — BP 148/95 | HR 85 | Ht 67.0 in | Wt 204.0 lb

## 2018-03-18 DIAGNOSIS — M65969 Unspecified synovitis and tenosynovitis, unspecified lower leg: Secondary | ICD-10-CM

## 2018-03-18 DIAGNOSIS — M659 Synovitis and tenosynovitis, unspecified: Secondary | ICD-10-CM

## 2018-03-18 DIAGNOSIS — M25561 Pain in right knee: Secondary | ICD-10-CM

## 2018-03-18 MED ORDER — METHYLPREDNISOLONE ACETATE 40 MG/ML IJ SUSP
40.0000 mg | INTRAMUSCULAR | Status: AC | PRN
  Administered 2018-03-18: 40 mg via INTRA_ARTICULAR

## 2018-03-18 MED ORDER — LIDOCAINE HCL 1 % IJ SOLN
0.5000 mL | INTRAMUSCULAR | Status: AC | PRN
  Administered 2018-03-18: .5 mL

## 2018-03-18 MED ORDER — BUPIVACAINE HCL 0.25 % IJ SOLN
4.0000 mL | INTRAMUSCULAR | Status: AC | PRN
Start: 2018-03-18 — End: 2018-03-18
  Administered 2018-03-18: 4 mL via INTRA_ARTICULAR

## 2018-03-18 NOTE — Progress Notes (Signed)
Office Visit Note   Patient: Brittany Durham           Date of Birth: 1970/05/31           MRN: [id number] Visit Date: 03/18/2018              Requested by: Sharion Balloon, [address] [place], [place] [address] PCP: Sharion Balloon, FNP   Assessment & Plan: Visit Diagnoses:  1. Synovitis of knee     Plan: 30 cc yellow serous fluid aspirated.  She had some anxiety with the injection and aspiration was terminated in knee cortisone and Marcaine injection performed with good relief.  She will follow-up if she has persistent problems and can continue her exercise program.  Follow-Up Instructions: No follow-ups on file.   Orders:  Orders Placed This Encounter  Procedures  . Large Joint Inj: R knee   No orders of the defined types were placed in this encounter.     Procedures: Large Joint Inj: R knee on 03/18/2018 4:40 PM Indications: pain and joint swelling Details: 22 G 1.5 in needle, lateral approach  Arthrogram: No  Medications: 4 mL bupivacaine 0.25 %; 0.5 mL lidocaine 1 %; 40 mg methylPREDNISolone acetate 40 MG/ML Aspirate: 30 mL yellow Outcome: tolerated well, no immediate complications Procedure, treatment alternatives, risks and benefits explained, specific risks discussed. Consent was given by the patient. Immediately prior to procedure a time out was called to verify the correct patient, procedure, equipment, support staff and site/side marked as required. Patient was prepped and draped in the usual sterile fashion.       Clinical Data: No additional findings.   Subjective: Chief Complaint  Patient presents with  . Right Knee - Follow-up    HPI 47 year old female returns with recurrent synovitis post arthroscopy with PVNS villonodular localized after a fall.  She is been going to therapy having problems with knee extension exercises.  She thinks the therapy has helped her to some degree.  Today she returns and has recurrent effusion which is 2-3+.   No fever chills no locking or falling.  Review of Systems 14 point systems updated unchanged from surgery.   Objective: Vital Signs: BP (!) 148/95   Pulse 85   Ht 5\' 7"  (1.702 m)   Wt 204 lb (92.5 kg)   BMI 31.95 kg/m   Physical Exam  Constitutional: She is oriented to person, place, and time. She appears well-developed.  HENT:  Head: Normocephalic.  Right Ear: External ear normal.  Left Ear: External ear normal.  Eyes: Pupils are equal, round, and reactive to light.  Neck: No tracheal deviation present. No thyromegaly present.  Cardiovascular: Normal rate.  Pulmonary/Chest: Effort normal.  Abdominal: Soft.  Neurological: She is alert and oriented to person, place, and time.  Skin: Skin is warm and dry.  Psychiatric: She has a normal mood and affect. Her behavior is normal.    Ortho Exam 3+ knee effusion.  Tenderness medially laterally.  Ligamentous exam is normal.  No increased warmth.  Specialty Comments:  No specialty comments available.  Imaging: No results found.   PMFS History: Patient Active Problem List   Diagnosis Date Noted  . Osteoarthritis 11/18/2017  . Insomnia 11/18/2017  . 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 Tear of lateral meniscus of right knee, current 10/28/2017  . 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 Language difficulty 09/25/2016  . Nausea with vomiting 07/18/2016  . PUD (peptic ulcer disease) 07/18/2016  .  Slurred speech   . GI bleed 05/02/2016  . Upper GI bleed 05/02/2016  . Sinus tachycardia 05/02/2016  . Acute blood loss anemia 05/02/2016  . Abdominal pain, epigastric 05/02/2016  . 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 Thyroid nodule 12/14/2014  . Left knee pain 12/14/2014  . Seizures ([place])   . Altered mental status 12/12/2014  . Vitamin D deficiency 06/30/2014  . GERD (gastroesophageal reflux disease) 06/29/2014  . SYNCOPE 03/02/2010  . HYPERTENSION, BENIGN 03/01/2010   Past Medical History:  Diagnosis  Date  . Hypertension   . Knee pain    left meniscus tear s/p repair 11/2014  . Seizures ([place])   . Stuttering    patient reports this started after her knee surgery in 11/2014.   Marland Kitchen Thyroid nodule     Family History  Problem Relation Age of Onset  . Hypertension Mother   . Hypertension Brother   . Colon cancer Neg Hx     Past Surgical History:  Procedure Laterality Date  . COLONOSCOPY N/A 08/23/2016   Procedure: COLONOSCOPY;  Surgeon: Daneil Dolin, MD;  [facility];  Service: Endoscopy;  Laterality: N/A;  1:00pm  . ESOPHAGOGASTRODUODENOSCOPY N/A 05/03/2016   Procedure: ESOPHAGOGASTRODUODENOSCOPY (EGD);  Surgeon: Daneil Dolin, MD;  [facility];  Service: Endoscopy;  Laterality: N/A;  . ESOPHAGOGASTRODUODENOSCOPY N/A 08/23/2016   Procedure: ESOPHAGOGASTRODUODENOSCOPY (EGD);  Surgeon: Daneil Dolin, MD;  [facility];  Service: Endoscopy;  Laterality: N/A;  . KNEE ARTHROSCOPY WITH MEDIAL MENISECTOMY Left 12/09/2014   Procedure: KNEE ARTHROSCOPY WITH PARTIAL MEDIAL AND LATERAL MENISECTOMY;  Surgeon: Sanjuana Kava, MD;  Location: [facility];  Service: Orthopedics;  Laterality: Left;  . TUBAL LIGATION    . WISDOM TOOTH EXTRACTION     Social History   Occupational History  . Occupation: [employer]ployed  Tobacco Use  . Smoking status: Never Smoker  . Smokeless tobacco: Never Used  Substance and Sexual Activity  . Alcohol use: No  . Drug use: No  . Sexual activity: Yes    Birth control/protection: Surgical

## 2018-04-17 ENCOUNTER — Other Ambulatory Visit: Payer: Self-pay

## 2018-04-17 ENCOUNTER — Other Ambulatory Visit: Payer: Self-pay | Admitting: Gastroenterology

## 2018-04-17 DIAGNOSIS — I10 Essential (primary) hypertension: Secondary | ICD-10-CM

## 2018-04-17 DIAGNOSIS — K279 Peptic ulcer, site unspecified, unspecified as acute or chronic, without hemorrhage or perforation: Secondary | ICD-10-CM

## 2018-04-17 MED ORDER — AMLODIPINE BESYLATE 10 MG PO TABS: 10.0000 mg | ORAL_TABLET | Freq: Every day | ORAL | 0 refills | Status: DC

## 2018-05-08 ENCOUNTER — Other Ambulatory Visit: Payer: Self-pay | Admitting: Family

## 2018-05-08 ENCOUNTER — Encounter: Payer: Medicare Other | Admitting: Family

## 2018-05-09 NOTE — Telephone Encounter (Signed)
Last Vit D 11/18/17  28.3

## 2018-05-16 ENCOUNTER — Encounter: Payer: Self-pay | Admitting: Family

## 2018-06-14 ENCOUNTER — Emergency Department (HOSPITAL_COMMUNITY): Payer: Medicare Other

## 2018-06-14 ENCOUNTER — Other Ambulatory Visit: Payer: Self-pay

## 2018-06-14 ENCOUNTER — Encounter (HOSPITAL_COMMUNITY): Payer: Self-pay | Admitting: Emergency Medicine

## 2018-06-14 ENCOUNTER — Emergency Department (HOSPITAL_COMMUNITY)
Admission: EM | Admit: 2018-06-14 | Discharge: 2018-06-14 | Disposition: A | Discharge status: Home / Self Care | Payer: Medicare Other | Attending: Emergency Medicine | Admitting: Emergency Medicine

## 2018-06-14 DIAGNOSIS — R000 Tachycardia, unspecified: Secondary | ICD-10-CM | POA: Diagnosis not present

## 2018-06-14 DIAGNOSIS — M79602 Pain in left arm: Secondary | ICD-10-CM

## 2018-06-14 DIAGNOSIS — Z79899 Other long term (current) drug therapy: Secondary | ICD-10-CM | POA: Insufficient documentation

## 2018-06-14 DIAGNOSIS — R202 Paresthesia of skin: Secondary | ICD-10-CM | POA: Insufficient documentation

## 2018-06-14 DIAGNOSIS — R51 Headache: Secondary | ICD-10-CM | POA: Diagnosis not present

## 2018-06-14 DIAGNOSIS — M25512 Pain in left shoulder: Secondary | ICD-10-CM

## 2018-06-14 DIAGNOSIS — I10 Essential (primary) hypertension: Secondary | ICD-10-CM | POA: Diagnosis not present

## 2018-06-14 DIAGNOSIS — M5030 Other cervical disc degeneration, unspecified cervical region: Secondary | ICD-10-CM | POA: Diagnosis not present

## 2018-06-14 DIAGNOSIS — R0602 Shortness of breath: Secondary | ICD-10-CM | POA: Diagnosis not present

## 2018-06-14 HISTORY — DX: Carotid sinus syncope: G90.01

## 2018-06-14 HISTORY — DX: Other cervical disc degeneration, unspecified cervical region: M50.30

## 2018-06-14 LAB — CBC WITH DIFFERENTIAL/PLATELET
Abs Immature Granulocytes: 0.02 10*3/uL (ref 0.00–0.07)
Basophils Absolute: 0 10*3/uL (ref 0.0–0.1)
Basophils Relative: 0 %
Eosinophils Absolute: 0.1 10*3/uL (ref 0.0–0.5)
Eosinophils Relative: 1 %
HCT: 38.5 % (ref 36.0–46.0)
Hemoglobin: 12.3 g/dL (ref 12.0–15.0)
Immature Granulocytes: 0 %
Lymphocytes Relative: 32 %
Lymphs Abs: 2.4 10*3/uL (ref 0.7–4.0)
MCH: 28.8 pg (ref 26.0–34.0)
MCHC: 31.9 g/dL (ref 30.0–36.0)
MCV: 90.2 fL (ref 80.0–100.0)
Monocytes Absolute: 0.5 10*3/uL (ref 0.1–1.0)
Monocytes Relative: 7 %
Neutro Abs: 4.6 10*3/uL (ref 1.7–7.7)
Neutrophils Relative %: 60 %
Platelets: 227 10*3/uL (ref 150–400)
RBC: 4.27 MIL/uL (ref 3.87–5.11)
RDW: 12.4 % (ref 11.5–15.5)
WBC: 7.6 10*3/uL (ref 4.0–10.5)
nRBC: 0 % (ref 0.0–0.2)

## 2018-06-14 LAB — TROPONIN I: Troponin I: <0.03 ng/mL (ref <0.03)

## 2018-06-14 LAB — D-DIMER, QUANTITATIVE: D-Dimer, Quant: 0.43 ug/mL-FEU (ref 0.00–0.50)

## 2018-06-14 LAB — BASIC METABOLIC PANEL
Anion gap: 7 (ref 5–15)
BUN: 17 mg/dL (ref 6–20)
CO2: 27 mmol/L (ref 22–32)
Calcium: 9.8 mg/dL (ref 8.9–10.3)
Chloride: 105 mmol/L (ref 98–111)
Creatinine, Ser: 0.89 mg/dL (ref 0.44–1.00)
GFR calc Af Amer: >60 mL/min (ref >60)
GFR calc non Af Amer: >60 mL/min (ref >60)
Glucose, Bld: 100 mg/dL — ABNORMAL HIGH (ref 70–99)
Potassium: 3.8 mmol/L (ref 3.5–5.1)
Sodium: 139 mmol/L (ref 135–145)

## 2018-06-14 MED ORDER — HYDROCODONE-ACETAMINOPHEN 5-325 MG PO TABS: ORAL_TABLET | ORAL | 0 refills | Status: DC

## 2018-06-14 MED ORDER — HYDROCODONE-ACETAMINOPHEN 5-325 MG PO TABS
1.0000 | ORAL_TABLET | Freq: Once | ORAL | Status: AC
  Administered 2018-06-14: 1 via ORAL
  Filled 2018-06-14: qty 1

## 2018-06-14 MED ORDER — METHOCARBAMOL 500 MG PO TABS: 1000.0000 mg | ORAL_TABLET | Freq: Four times a day (QID) | ORAL | 0 refills | Status: DC | PRN

## 2018-06-14 NOTE — Discharge Instructions (Addendum)
Your CT scan showed an incidental finding:  "Enlarged thyroid with probable nodules. Consider non emergent follow-up thyroid ultrasound for further assessment."  Your regular medical doctor can follow up this finding. Take the prescriptions as directed.  Apply moist heat or ice to the area(s) of discomfort, for 15 minutes at a time, several times per day for the next few days.  Do not fall asleep on a heating or ice pack.  Call your regular medical doctor on Monday to schedule a follow up appointment this week.  Return to the Emergency Department immediately if worsening.

## 2018-06-14 NOTE — ED Triage Notes (Addendum)
Pt c/o LT arm tingling and pain that began around 0800 yesterday morning. Pt also c/o mild SOB. Denies any other neuro deficits.

## 2018-06-14 NOTE — ED Provider Notes (Signed)
[facility] EMERGENCY DEPARTMENT Provider Note   CSN: [id number] Arrival date & time: 06/14/18  1610     History   Chief Complaint Chief Complaint  Patient presents with  . Arm Pain    HPI Brittany Durham is a 48 y.o. female.  HPI  Pt was seen at 1820. Per pt and her family, c/o gradual onset and persistence of constant entire left arm "pain" and "tingling" that she noticed yesterday when she woke up at 0800. Has been associated with mild SOB. Pain starts in her left shoulder and runs down her entire left arm. Pain worsens with palpation of her left shoulder and ROM left shoulder/arm. Pt is right handed. Denies injury, no rash, no fevers, no focal motor weakness, no back pain, no CP/palpitations, no cough, no abd pain, no N/V/D.    Past Medical History:  Diagnosis Date  . Carotidynia   . DDD (degenerative disc disease), cervical   . Hypertension   . Knee pain    left meniscus tear s/p repair 11/2014  . Seizures ([place])   . Stuttering    patient reports this started after her knee surgery in 11/2014.   Marland Kitchen Thyroid nodule     Patient Active Problem List   Diagnosis Date Noted  . Osteoarthritis 11/18/2017  . Insomnia 11/18/2017  . 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 Tear of lateral meniscus of right knee, current 10/28/2017  . 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 Language difficulty 09/25/2016  . Nausea with vomiting 07/18/2016  . PUD (peptic ulcer disease) 07/18/2016  . Slurred speech   . GI bleed 05/02/2016  . Upper GI bleed 05/02/2016  . Sinus tachycardia 05/02/2016  . Acute blood loss anemia 05/02/2016  . Abdominal pain, epigastric 05/02/2016  . 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 Thyroid nodule 12/14/2014  . Left knee pain 12/14/2014  . Seizures ([place])   . Altered mental status 12/12/2014  . Vitamin D deficiency 06/30/2014  . GERD (gastroesophageal reflux disease) 06/29/2014  . SYNCOPE 03/02/2010  . HYPERTENSION, BENIGN 03/01/2010    Past  Surgical History:  Procedure Laterality Date  . COLONOSCOPY N/A 08/23/2016   Procedure: COLONOSCOPY;  Surgeon: Daneil Dolin, MD;  Location: [facility];  Service: Endoscopy;  Laterality: N/A;  1:00pm  . ESOPHAGOGASTRODUODENOSCOPY N/A 05/03/2016   Procedure: ESOPHAGOGASTRODUODENOSCOPY (EGD);  Surgeon: Daneil Dolin, MD;  Location: [facility];  Service: Endoscopy;  Laterality: N/A;  . ESOPHAGOGASTRODUODENOSCOPY N/A 08/23/2016   Procedure: ESOPHAGOGASTRODUODENOSCOPY (EGD);  Surgeon: Daneil Dolin, MD;  Location: [facility];  Service: Endoscopy;  Laterality: N/A;  . KNEE ARTHROSCOPY WITH MEDIAL MENISECTOMY Left 12/09/2014   Procedure: KNEE ARTHROSCOPY WITH PARTIAL MEDIAL AND LATERAL MENISECTOMY;  Surgeon: Sanjuana Kava, MD;  Location: [facility];  Service: Orthopedics;  Laterality: Left;  . TUBAL LIGATION    . WISDOM TOOTH EXTRACTION       OB History    Gravida  1   Para  1   Term  1   Preterm      AB      Living  1     SAB      TAB      Ectopic      Multiple      Live Births               Home Medications    Prior to Admission medications   Medication Sig Start Date End Date Taking? Authorizing Provider  Acetaminophen (TYLENOL PO) Take  by mouth.    [provider]  amLODipine (NORVASC) 10 MG tablet Take 1 tablet (10 mg total) by mouth daily. 04/17/18   Sharion Balloon, FNP  gabapentin (NEURONTIN) 300 MG capsule Take 1 capsule (300 mg total) by mouth 3 (three) times daily. 12/23/17   Evelina Dun A, FNP  lisinopril (PRINIVIL,ZESTRIL) 20 MG tablet Take 1 tablet (20 mg total) by mouth daily. 01/16/18   Evelina Dun A, FNP  pantoprazole (PROTONIX) 40 MG tablet TAKE (1) TABLET TWICE A DAY BEFORE MEALS. 04/18/18   Annitta Needs, NP  Vitamin D, Ergocalciferol, (DRISDOL) 1.25 MG (50000 UT) CAPS capsule TAKE 1 CAPSULE ONCE A WEEK 05/09/18   Sharion Balloon, FNP    Family History Family History  Problem Relation Age of Onset  . Hypertension Mother   .  Hypertension Brother   . Colon cancer Neg Hx     Social History Social History   Tobacco Use  . Smoking status: Never Smoker  . Smokeless tobacco: Never Used  Substance Use Topics  . Alcohol use: No  . Drug use: No     Allergies   Patient has no known allergies.   Review of Systems Review of Systems ROS: Statement: All systems negative except as marked or noted in the HPI; Constitutional: Negative for fever and chills. ; ; Eyes: Negative for eye pain, redness and discharge. ; ; ENMT: Negative for ear pain, hoarseness, nasal congestion, sinus pressure and sore throat. ; ; Cardiovascular: Negative for chest pain, palpitations, diaphoresis, and peripheral edema. ; ; Respiratory: +SOB. Negative for cough, wheezing and stridor. ; ; Gastrointestinal: Negative for nausea, vomiting, diarrhea, abdominal pain, blood in stool, hematemesis, jaundice and rectal bleeding. . ; ; Genitourinary: Negative for dysuria, flank pain and hematuria. ; ; Musculoskeletal: +left arm pain, paresthesias. Negative for back pain and neck pain. Negative for swelling and trauma.; ; Skin: Negative for pruritus, rash, abrasions, blisters, bruising and skin lesion.; ; Neuro: Negative for headache, lightheadedness and neck stiffness. Negative for weakness, altered level of consciousness, altered mental status, extremity weakness, involuntary movement, seizure and syncope.       Physical Exam Updated Vital Signs BP (!) 167/102 (BP Location: Right Arm)   Pulse (!) 115   Temp 98.4 F (36.9 C) (Oral)   Resp 18   Ht 5\' 7"  (1.702 m)   Wt 95.3 kg   SpO2 100%   BMI 32.89 kg/m   Physical Exam 1825: Physical examination:  Nursing notes reviewed; Vital signs and O2 SAT reviewed;  Constitutional: Well developed, Well nourished, Well hydrated, In no acute distress; Head:  Normocephalic, atraumatic; Eyes: EOMI, PERRL, No scleral icterus; ENMT: Mouth and pharynx normal, Mucous membranes moist; Neck: Supple, Full range of  motion, No lymphadenopathy; Cardiovascular: Tachycardic rate and rhythm, No gallop; Respiratory: Breath sounds clear & equal bilaterally, No wheezes. Normal respiratory effort/excursion; Chest: +TTP left proximal lateral chest wall. No deformity, no soft tissue crepitus. Movement normal; Abdomen: Soft, Nontender, Nondistended, Normal bowel sounds; Genitourinary: No CVA tenderness;  Spine:  No midline CS, TS, LS tenderness. No rash.;; Extremities: Peripheral pulses normal. Left shoulder w/FROM. +tender to palp entire joint, esp AC joint. Left clavicle NT, scapula NT, proximal humerus NT, biceps tendon NT over bicipital groove.  Motor strength at shoulder normal.  Sensation intact over deltoid region, distal NMS intact with left hand having intact and equal sensation and strength in the distribution of the median, radial, and ulnar nerve function compared to opposite side.  Strong radial pulse.  +FROM left elbow with intact motor strength biceps and triceps muscles to resistance. NT left elbow/wrist/hand. LUE muscles compartments soft. No rash. No deformity. No edema.; Neuro: AA&Ox3, Major CN grossly intact. No facial droop. Speech stuttering per baseline. No gross focal motor or sensory deficits in extremities.; Skin: Color normal, Warm, Dry.   ED Treatments / Results  Labs (all labs ordered are listed, but only abnormal results are displayed)   EKG EKG Interpretation  Date/Time:  Saturday June 14 2018 16:24:03 EST Ventricular Rate:  110 PR Interval:  144 QRS Duration: 76 QT Interval:  324 QTC Calculation: 438 R Axis:   44 Text Interpretation:  Sinus tachycardia Otherwise normal ECG When compared with ECG of 02/19/2018 Rate faster Otherwise no significant change Confirmed by Francine Graven [id number]) on 06/14/2018 6:32:41 PM   Radiology   Procedures Procedures (including critical care time)  Medications Ordered in ED Medications  HYDROcodone-acetaminophen (NORCO/VICODIN) 5-325 MG per  tablet 1 tablet (1 tablet Oral Given 06/14/18 1841)     Initial Impression / Assessment and Plan / ED Course  I have reviewed the triage vital signs and the nursing notes.  Pertinent labs & imaging results that were available during my care of the patient were reviewed by me and considered in my medical decision making (see chart for details).  MDM Reviewed: previous chart, nursing note and vitals Reviewed previous: labs, ECG and CT scan Interpretation: labs, ECG, x-ray and CT scan   Results for orders placed or performed during the hospital encounter of 45/80/99  Basic metabolic panel  Result Value Ref Range   Sodium 139 135 - 145 mmol/L   Potassium 3.8 3.5 - 5.1 mmol/L   Chloride 105 98 - 111 mmol/L   CO2 27 22 - 32 mmol/L   Glucose, Bld 100 (H) 70 - 99 mg/dL   BUN 17 6 - 20 mg/dL   Creatinine, Ser 0.89 0.44 - 1.00 mg/dL   Calcium 9.8 8.9 - 10.3 mg/dL   GFR calc non Af Amer >60 >60 mL/min   GFR calc Af Amer >60 >60 mL/min   Anion gap 7 5 - 15  Troponin I - Once  Result Value Ref Range   Troponin I <0.03 <0.03 ng/mL  CBC with Differential  Result Value Ref Range   WBC 7.6 4.0 - 10.5 K/uL   RBC 4.27 3.87 - 5.11 MIL/uL   Hemoglobin 12.3 12.0 - 15.0 g/dL   HCT 38.5 36.0 - 46.0 %   MCV 90.2 80.0 - 100.0 fL   MCH 28.8 26.0 - 34.0 pg   MCHC 31.9 30.0 - 36.0 g/dL   RDW 12.4 11.5 - 15.5 %   Platelets 227 150 - 400 K/uL   nRBC 0.0 0.0 - 0.2 %   Neutrophils Relative % 60 %   Neutro Abs 4.6 1.7 - 7.7 K/uL   Lymphocytes Relative 32 %   Lymphs Abs 2.4 0.7 - 4.0 K/uL   Monocytes Relative 7 %   Monocytes Absolute 0.5 0.1 - 1.0 K/uL   Eosinophils Relative 1 %   Eosinophils Absolute 0.1 0.0 - 0.5 K/uL   Basophils Relative 0 %   Basophils Absolute 0.0 0.0 - 0.1 K/uL   Immature Granulocytes 0 %   Abs Immature Granulocytes 0.02 0.00 - 0.07 K/uL  D-dimer, quantitative  Result Value Ref Range   D-Dimer, Quant 0.43 0.00 - 0.50 ug/mL-FEU   Dg Chest 2 View Result Date:  06/14/2018 CLINICAL DATA:  Shortness of  breath EXAM: CHEST - 2 VIEW COMPARISON:  09/21/2016 FINDINGS: The heart size and mediastinal contours are within normal limits. Both lungs are clear. The visualized skeletal structures are unremarkable. IMPRESSION: No active cardiopulmonary disease. Electronically Signed   By: Misty Stanley M.D.   On: 06/14/2018 19:17   Ct Head Wo Contrast Result Date: 06/14/2018 CLINICAL DATA:  Per ED notes: Pt c/o LT arm tingling and pain that began around 0800 yesterday morning. Pt also c/o mild SOB. Denies any other neuro deficits. EXAM: CT HEAD WITHOUT CONTRAST CT CERVICAL SPINE WITHOUT CONTRAST TECHNIQUE: Multidetector CT imaging of the head and cervical spine was performed following the standard protocol without intravenous contrast. Multiplanar CT image reconstructions of the cervical spine were also generated. COMPARISON:  12/12/2014 FINDINGS: CT HEAD FINDINGS Brain: No evidence of acute infarction, hemorrhage, hydrocephalus, extra-axial collection or mass lesion/mass effect. Vascular: No hyperdense vessel or unexpected calcification. Skull: Normal. Negative for fracture or focal lesion. Sinuses/Orbits: Visualized globes and orbits are unremarkable. The visualized sinuses and mastoid air cells are clear. Other: None. CT CERVICAL SPINE FINDINGS Alignment: Reversal of the normal cervical lordosis which may be positional. No spondylolisthesis. Skull base and vertebrae: No acute fracture. No primary bone lesion or focal pathologic process. Soft tissues and spinal canal: No prevertebral fluid or swelling. No visible canal hematoma. Disc levels: Mild loss of disc height at C3-C4 at C4-C5. Moderate loss of disc height at C5-C6. Mild spondylotic disc bulging with endplate spurring noted from C3-C4 through C6-C7, greatest at C5-C6. Uncovertebral spurring leads to mild neural foraminal narrowing on the left at C3-C4, C4-C5 and C5-C6. Right neural foramina are well preserved. No convincing disc  herniation. Upper chest: Thyroid gland appears enlarged and heterogeneous suggesting nodules. No discrete defined nodule CT. No neck base masses. No adenopathy. Clear lung apices. Other: None. IMPRESSION: HEAD CT 1. Normal enhanced CT scan of the brain. CERVICAL CT 1. No fracture or acute finding. Disc degenerative changes as described. Mild degrees of left neural foraminal narrowing. 2. Enlarged thyroid with probable nodules. Consider non emergent follow-up thyroid ultrasound for further assessment. Electronically Signed   By: Lajean Manes M.D.   On: 06/14/2018 19:13   Ct Cervical Spine Wo Contrast Result Date: 06/14/2018 CLINICAL DATA:  Per ED notes: Pt c/o LT arm tingling and pain that began around 0800 yesterday morning. Pt also c/o mild SOB. Denies any other neuro deficits. EXAM: CT HEAD WITHOUT CONTRAST CT CERVICAL SPINE WITHOUT CONTRAST TECHNIQUE: Multidetector CT imaging of the head and cervical spine was performed following the standard protocol without intravenous contrast. Multiplanar CT image reconstructions of the cervical spine were also generated. COMPARISON:  12/12/2014 FINDINGS: CT HEAD FINDINGS Brain: No evidence of acute infarction, hemorrhage, hydrocephalus, extra-axial collection or mass lesion/mass effect. Vascular: No hyperdense vessel or unexpected calcification. Skull: Normal. Negative for fracture or focal lesion. Sinuses/Orbits: Visualized globes and orbits are unremarkable. The visualized sinuses and mastoid air cells are clear. Other: None. CT CERVICAL SPINE FINDINGS Alignment: Reversal of the normal cervical lordosis which may be positional. No spondylolisthesis. Skull base and vertebrae: No acute fracture. No primary bone lesion or focal pathologic process. Soft tissues and spinal canal: No prevertebral fluid or swelling. No visible canal hematoma. Disc levels: Mild loss of disc height at C3-C4 at C4-C5. Moderate loss of disc height at C5-C6. Mild spondylotic disc bulging with  endplate spurring noted from C3-C4 through C6-C7, greatest at C5-C6. Uncovertebral spurring leads to mild neural foraminal narrowing on the left at C3-C4, C4-C5  and C5-C6. Right neural foramina are well preserved. No convincing disc herniation. Upper chest: Thyroid gland appears enlarged and heterogeneous suggesting nodules. No discrete defined nodule CT. No neck base masses. No adenopathy. Clear lung apices. Other: None. IMPRESSION: HEAD CT 1. Normal enhanced CT scan of the brain. CERVICAL CT 1. No fracture or acute finding. Disc degenerative changes as described. Mild degrees of left neural foraminal narrowing. 2. Enlarged thyroid with probable nodules. Consider non emergent follow-up thyroid ultrasound for further assessment. Electronically Signed   By: Lajean Manes M.D.   On: 06/14/2018 19:13   Dg Shoulder Left Result Date: 06/14/2018 CLINICAL DATA:  Shortness of breath. Left shoulder and arm pain with tingling down the left arm. EXAM: LEFT SHOULDER - 2+ VIEW COMPARISON:  None. FINDINGS: There is no evidence of fracture or dislocation. There is no evidence of arthropathy or other focal bone abnormality. Soft tissues are unremarkable. IMPRESSION: Negative. Electronically Signed   By: Lucienne Capers M.D.   On: 06/14/2018 19:17     2000:  Feels better after meds. Workup reassuring. Doubt PE as cause for symptoms with normal d-dimer and low risk [place].  Doubt ACS as cause for symptoms with normal troponin and unchanged EKG from previous after 2 days of constant symptoms. Doubt CVA with normal CT Head after 2 days of constant symptoms. Tx symptomatically at this time, f/u PMD. Dx and testing d/w pt and family.  Questions answered.  Verb understanding, agreeable to d/c home with outpt f/u.       Final Clinical Impressions(s) / ED Diagnoses   Final diagnoses:  None    ED Discharge Orders    None       Francine Graven, DO 06/18/18 4920

## 2018-06-17 ENCOUNTER — Telehealth: Payer: Self-pay | Admitting: Family

## 2018-06-19 ENCOUNTER — Encounter: Payer: Self-pay | Admitting: Family

## 2018-06-19 ENCOUNTER — Ambulatory Visit (INDEPENDENT_AMBULATORY_CARE_PROVIDER_SITE_OTHER): Payer: Medicare Other | Admitting: Family

## 2018-06-19 VITALS — BP 136/89 | HR 94 | Temp 97.8°F | Ht 67.0 in | Wt 207.0 lb

## 2018-06-19 DIAGNOSIS — M5412 Radiculopathy, cervical region: Secondary | ICD-10-CM

## 2018-06-19 DIAGNOSIS — E049 Nontoxic goiter, unspecified: Secondary | ICD-10-CM | POA: Diagnosis not present

## 2018-06-19 DIAGNOSIS — Z09 Encounter for follow-up examination after completed treatment for conditions other than malignant neoplasm: Secondary | ICD-10-CM | POA: Diagnosis not present

## 2018-06-19 MED ORDER — DICLOFENAC SODIUM 75 MG PO TBEC: 75.0000 mg | DELAYED_RELEASE_TABLET | Freq: Two times a day (BID) | ORAL | 0 refills | Status: DC

## 2018-06-19 MED ORDER — PREDNISONE 10 MG (21) PO TBPK: ORAL_TABLET | ORAL | 0 refills | Status: DC

## 2018-06-19 NOTE — Progress Notes (Signed)
   Subjective:    Patient ID: Brittany Durham, female    DOB: 03-Aug-1970, 48 y.o.   MRN: [id number]  Chief Complaint  Patient presents with  . Follow-up   PT presents to the office today for ED follow up. She went to the ED on on 06/14/18 with left shoulder pain and tingling. She had a negative CT head, Ct cervical, shoulder x-ray. She states she continues to have constant aching, dull pain of 8 out 10 that radiates down into her hand.   The CT of her head showed an enlarged thyroid with probable nodules. Recommend follow up US.  Shoulder Pain        Review of Systems  All other systems reviewed and are negative.      Objective:   Physical Exam Vitals signs reviewed.  Constitutional:      General: She is not in acute distress.    Appearance: She is well-developed.  HENT:     Head: Normocephalic and atraumatic.     Right Ear: Tympanic membrane normal.     Left Ear: Tympanic membrane normal.  Neck:     Musculoskeletal: Normal range of motion and neck supple.     Thyroid: No thyromegaly.     Comments: Thyroid goiter  Cardiovascular:     Rate and Rhythm: Normal rate and regular rhythm.     Heart sounds: Normal heart sounds. No murmur.  Pulmonary:     Effort: Pulmonary effort is normal. No respiratory distress.     Breath sounds: Normal breath sounds. No wheezing.  Abdominal:     General: Bowel sounds are normal. There is no distension.     Palpations: Abdomen is soft.     Tenderness: There is no abdominal tenderness.  Musculoskeletal: Normal range of motion.        General: No tenderness.  Skin:    General: Skin is warm and dry.  Neurological:     Mental Status: She is alert and oriented to person, place, and time.     Cranial Nerves: No cranial nerve deficit.     Deep Tendon Reflexes: Reflexes are normal and symmetric.  Psychiatric:        Behavior: Behavior normal.        Thought Content: Thought content normal.        Judgment: Judgment normal.       BP (!)  141/96   Pulse (!) 103   Temp 97.8 F (36.6 C) (Oral)   Ht 5\' 7"  (1.702 m)   Wt 207 lb (93.9 kg)   BMI 32.42 kg/m      Assessment & Plan:  Brittany Durham comes in today with chief complaint of Follow-up   Diagnosis and orders addressed:  1. Cervical radiculopathy Rest Ice  ROM exercises encouraged  RTO if symptoms worsen or do not improve - predniSONE (STERAPRED UNI-PAK 21 TAB) 10 MG (21) TBPK tablet; Use as directed  Dispense: 21 tablet; Refill: 0 - diclofenac (VOLTAREN) 75 MG EC tablet; Take 1 tablet (75 mg total) by mouth 2 (two) times daily.  Dispense: 30 tablet; Refill: 0 - Ambulatory referral to Physical Therapy  2. Enlarged thyroid - US Soft Tissue Head/Neck; Future  3. Hospital discharge follow-up     Brittany Dun, FNP

## 2018-06-19 NOTE — Patient Instructions (Signed)
Cervical Radiculopathy    Cervical radiculopathy happens when a nerve in the neck (cervical nerve) is pinched or bruised. This condition can develop because of an injury or as part of the normal aging process. Pressure on the cervical nerves can cause pain or numbness that runs from the neck all the way down into the arm and fingers. Usually, this condition gets better with rest. Treatment may be needed if the condition does not improve.  What are the causes?  This condition may be caused by:   Injury.   Slipped (herniated) disk.   Muscle tightness in the neck because of overuse.   Arthritis.   Breakdown or degeneration in the bones and joints of the spine (spondylosis) due to aging.   Bone spurs that may develop near the cervical nerves.  What are the signs or symptoms?  Symptoms of this condition include:   Pain that runs from the neck to the arm and hand. The pain can be severe or irritating. It may be worse when the neck is moved.   Numbness or weakness in the affected arm and hand.  How is this diagnosed?  This condition may be diagnosed based on symptoms, medical history, and a physical exam. You may also have tests, including:   X-rays.   CT scan.   MRI.   Electromyogram (EMG).   Nerve conduction tests.  How is this treated?  In many cases, treatment is not needed for this condition. With rest, the condition usually gets better over time. If treatment is needed, options may include:   Wearing a soft neck collar for short periods of time.   Physical therapy to strengthen your neck muscles.   Medicines, such as NSAIDs, oral corticosteroids, or spinal injections.   Surgery. This may be needed if other treatments do not help. Various types of surgery may be done depending on the cause of your problems.  Follow these instructions at home:  Managing pain   Take over-the-counter and prescription medicines only as told by your health care provider.   If directed, apply ice to the affected  area.  ? Put ice in a plastic bag.  ? Place a towel between your skin and the bag.  ? Leave the ice on for 20 minutes, 2-3 times per day.   If ice does not help, you can try using heat. Take a warm shower or warm bath, or use a heat pack as told by your health care provider.   Try a gentle neck and shoulder massage to help relieve symptoms.  Activity   Rest as needed. Follow instructions from your health care provider about any restrictions on activities.   Do stretching and strengthening exercises as told by your health care provider or physical therapist.  General instructions   If you were given a soft collar, wear it as told by your health care provider.   Use a flat pillow when you sleep.   Keep all follow-up visits as told by your health care provider. This is important.  Contact a health care provider if:   Your condition does not improve with treatment.  Get help right away if:   Your pain gets much worse and cannot be controlled with medicines.   You have weakness or numbness in your hand, arm, face, or leg.   You have a high fever.   You have a stiff, rigid neck.   You lose control of your bowels or your bladder (have incontinence).   You   have trouble with walking, balance, or speaking.  This information is not intended to replace advice given to you by your health care provider. Make sure you discuss any questions you have with your health care provider.  Document Released: 01/23/2001 Document Revised: 10/06/2015 Document Reviewed: 06/24/2014  [employer] Interactive Patient Education  2019 [employer]

## 2018-06-25 ENCOUNTER — Ambulatory Visit: Payer: Medicare Other | Attending: Family | Admitting: Physical Therapy

## 2018-06-25 ENCOUNTER — Encounter: Payer: Self-pay | Admitting: Physical Therapy

## 2018-06-25 ENCOUNTER — Other Ambulatory Visit: Payer: Self-pay

## 2018-06-25 DIAGNOSIS — M542 Cervicalgia: Secondary | ICD-10-CM

## 2018-06-25 DIAGNOSIS — R293 Abnormal posture: Secondary | ICD-10-CM | POA: Insufficient documentation

## 2018-06-25 DIAGNOSIS — M5412 Radiculopathy, cervical region: Secondary | ICD-10-CM | POA: Insufficient documentation

## 2018-06-25 NOTE — Therapy (Signed)
[facility] [place], [place], [address] Phone: [phone]   Fax:  [phone]  Physical Therapy Evaluation  Patient Details  Name: Brittany Durham MRN: [id number] Date of Birth: [birth year] Referring Provider (PT): Evelina Dun, FNP   Encounter Date: 06/25/2018  PT End of Session - 06/25/18 1522    Visit Number  1    Number of Visits  12    Date for PT Re-Evaluation  09/23/18    PT Start Time  0835    PT Stop Time  0916    PT Time Calculation (min)  41 min    Activity Tolerance  Patient tolerated treatment well    Behavior During Therapy  [facility] for tasks assessed/performed       Past Medical History:  Diagnosis Date  . Carotidynia   . DDD (degenerative disc disease), cervical   . Hypertension   . Knee pain    left meniscus tear s/p repair 11/2014  . Seizures ([place])   . Stuttering    patient reports this started after her knee surgery in 11/2014.   Marland Kitchen Thyroid nodule     Past Surgical History:  Procedure Laterality Date  . COLONOSCOPY N/A 08/23/2016   Procedure: COLONOSCOPY;  Surgeon: Daneil Dolin, MD;  [facility];  Service: Endoscopy;  Laterality: N/A;  1:00pm  . ESOPHAGOGASTRODUODENOSCOPY N/A 05/03/2016   Procedure: ESOPHAGOGASTRODUODENOSCOPY (EGD);  Surgeon: Daneil Dolin, MD;  [facility];  Service: Endoscopy;  Laterality: N/A;  . ESOPHAGOGASTRODUODENOSCOPY N/A 08/23/2016   Procedure: ESOPHAGOGASTRODUODENOSCOPY (EGD);  Surgeon: Daneil Dolin, MD;  [facility];  Service: Endoscopy;  Laterality: N/A;  . KNEE ARTHROSCOPY WITH MEDIAL MENISECTOMY Left 12/09/2014   Procedure: KNEE ARTHROSCOPY WITH PARTIAL MEDIAL AND LATERAL MENISECTOMY;  Surgeon: Sanjuana Kava, MD;  Location: [facility];  Service: Orthopedics;  Laterality: Left;  . TUBAL LIGATION    . WISDOM TOOTH EXTRACTION      There were no vitals filed for this visit.   Subjective Assessment - 06/25/18 1520    Subjective    Patient arrives to physical therapy with reports of left sided cervical and shoulder pain that began insidiously on June 14, 2018. Patient reports waking up with pain in her neck and shoulder and thought she may have slept wrong on it. Patient states going to the emergency room due to pain, tingling, and numbness down to left hand. Patient reported head CT scan was normal, cervical x-ray noted with degenerative changes and loss of disc height. Patient reports medication only helps "a little bit" and she still feels pain with movement. Patient reports at worst pain is 10/10 and pain at best is 7/10. Patient's goals are to decrease pain, improve movement, improve strength, and improve ability to perform ADLs and care for her 50 year old grandson.    Pertinent History  HTN, Suttering; left knee surgery, history of syncope    Limitations  Reading;Lifting;House hold activities    How long can you stand comfortably?       How long can you walk comfortably?       Diagnostic tests  X-ray, CT Scan See imaging tab    Patient Stated Goals  get out of pain, be able to watch her grandson    Currently in Pain?  Yes    Pain Score  8     Pain Location  Neck    Pain Orientation  Left;Lower    Pain Descriptors / Indicators  Sharp;Tingling;Numbness    Pain Type  Acute pain    Pain Onset  1 to 4 weeks ago    Pain Frequency  Constant    Aggravating Factors   movement    Pain Relieving Factors  resting    Effect of Pain on Daily Activities  pain with all ADLs, unable to watch grandson without pain         [facility] PT Assessment - 06/25/18 0001      Assessment   Medical Diagnosis  Cervical radiculopathy    Referring Provider (PT)  Evelina Dun, FNP    Onset Date/Surgical Date  06/14/18   Onset   Hand Dominance  Right    Next MD Visit  06/26/2018    Prior Therapy  not for cervical spine      Precautions   Precautions  None      Restrictions   Weight Bearing Restrictions  No      Balance Screen   Has  the patient fallen in the past 6 months  No    Has the patient had a decrease in activity level because of a fear of falling?   No    Is the patient reluctant to leave their home because of a fear of falling?   No      Home Environment   Living Environment  Private residence    Living Arrangements  Alone      Prior Function   Level of Independence  Independent      Sensation   Light Touch  Impaired by gross assessment   numbness to triceps and all finger tips     Posture/Postural Control   Posture/Postural Control  Postural limitations    Postural Limitations  Rounded Shoulders;Forward head;Decreased thoracic kyphosis      ROM / Strength   AROM / PROM / Strength  AROM      AROM   Overall AROM   Due to pain    Overall AROM Comments  right shoulder AROM WFL; pain with all cervical motions    AROM Assessment Site  Shoulder;Cervical    Right/Left Shoulder  Left    Left Shoulder Flexion  140 Degrees    Left Shoulder ABduction  96 Degrees    Cervical Flexion  20    Cervical Extension  23    Cervical - Right Side Bend  25    Cervical - Left Side Bend  23    Cervical - Right Rotation  45    Cervical - Left Rotation  26      Palpation   Palpation comment  very tender to palpation to cervical paraspinals, left UT with increased UT tone, tender and increased tingling to triceps upon palpation.      Special Tests    Special Tests  Cervical    Other special tests  Diminished UE DTR    Cervical Tests  Spurling's      Spurling's   Findings  Positive    Side  Left    Comment  reproduction of symptoms                Objective measurements completed on examination: See above findings.      [facility] Adult PT Treatment/Exercise - 06/25/18 0001      Modalities   Modalities  Electrical Stimulation;Moist Heat      Moist Heat Therapy   Number Minutes Moist Heat  10 Minutes    Moist Heat Location  Cervical  Acupuncturist Location   left cervical and UT    Electrical Stimulation Action  IFC    Electrical Stimulation Parameters  80-150 hz x10 mins    Electrical Stimulation Goals  Pain             PT Education - 06/25/18 1528    Education Details  chin tucks, scapular retractions    Person(s) Educated  Patient    Methods  Explanation;Demonstration;Handout    Comprehension  Returned demonstration;Verbalized understanding       PT Short Term Goals - 01/28/18 1727      PT SHORT TERM GOAL #1   Title  STG's=LTG's.        PT Long Term Goals - 06/25/18 1529      PT LONG TERM GOAL #1   Title  Patient will be independent with HEP and its progression.    Time  6    Period  Weeks    Status  New      PT LONG TERM GOAL #2   Title  Patient will demonstrate cervical rotation to 55+ degrees bilaterally to scan environment.     Time  6    Period  Weeks    Status  New      PT LONG TERM GOAL #3   Title  Patient will report ability to perform ADLs with cervical pain less than 4/10.     Time  6    Period  Weeks    Status  New      PT LONG TERM GOAL #4   Title  Patient will report centralization of neurological symptoms to cervical spine to reduce nerve irritation.     Time  6    Period  Weeks    Status  New      PT LONG TERM GOAL #5   Title  Patient will report ability to sleep through the night with cervical pain less than 4/10.     Time  6    Period  Weeks    Status  New             Plan - 06/25/18 1525    Clinical Impression Statement  Patient is a 48 year old right handed female who presents to physical therapy with acute left sided cervical pain, decreased cervical range of motion, and neurological symptoms down left hand. Patient very tender to palpation to left cervical paraspinals, left UT with increased tone, and increased tingling when triceps palpated. Patient noted with diminished DTR in left UE. Patient (+) with spurling's compression test with reproduction of symptoms. Patient would  benefit from skilled physical therapy to address deficits and address patient's goals.      History and Personal Factors relevant to plan of care:  HTN, history of syncope    Clinical Presentation  Stable    Clinical Decision Making  Low    Rehab Potential  Good    Clinical Impairments Affecting Rehab Potential       PT Frequency  2x / week    PT Duration  6 weeks    PT Treatment/Interventions  ADLs/Self Care Home Management;Cryotherapy;Electrical Stimulation;Ultrasound;Therapeutic activities;Therapeutic exercise;Neuromuscular re-education;Patient/family education;Passive range of motion;Manual techniques;Traction;Moist Heat;Dry needling    PT Next Visit Plan  Conservative treatment to decrease pain, PROM, STW/M, then progress with postural exercises, and cervical strengthening, modalities as needed.    PT Home Exercise Plan  see patient education section    Consulted and Agree with Plan  of Care  Patient       Patient will benefit from skilled therapeutic intervention in order to improve the following deficits and impairments:  Decreased activity tolerance, Pain, Decreased range of motion, Decreased strength, Impaired UE functional use, Decreased endurance, Impaired tone, Postural dysfunction  Visit Diagnosis: Cervicalgia - Plan: PT plan of care cert/re-cert  Radiculopathy, cervical region - Plan: PT plan of care cert/re-cert  Abnormal posture - Plan: PT plan of care cert/re-cert     Problem List Patient Active Problem List   Diagnosis Date Noted  . Osteoarthritis 11/18/2017  . Insomnia 11/18/2017  . 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 Tear of lateral meniscus of right knee, current 10/28/2017  . 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 Language difficulty 09/25/2016  . Nausea with vomiting 07/18/2016  . PUD (peptic ulcer disease) 07/18/2016  . Slurred speech   . GI bleed 05/02/2016  . Upper GI bleed 05/02/2016  . Sinus tachycardia 05/02/2016  . Acute  blood loss anemia 05/02/2016  . Abdominal pain, epigastric 05/02/2016  . 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 Thyroid nodule 12/14/2014  . Left knee pain 12/14/2014  . Seizures ([place])   . Altered mental status 12/12/2014  . Vitamin D deficiency 06/30/2014  . GERD (gastroesophageal reflux disease) 06/29/2014  . SYNCOPE 03/02/2010  . HYPERTENSION, BENIGN 03/01/2010   Gabriela Eves, PT, DPT 06/25/2018, 3:43 PM  [facility] [place], [place], [address] Phone: [phone]   Fax:  [phone]  Name: Brittany Durham MRN: [id number] Date of Birth: 09-07-1970

## 2018-06-27 ENCOUNTER — Ambulatory Visit (HOSPITAL_COMMUNITY)
Admission: RE | Admit: 2018-06-27 | Discharge: 2018-06-27 | Disposition: A | Discharge status: Home / Self Care | Payer: Medicare Other | Source: Ambulatory Visit | Attending: Family | Admitting: Family

## 2018-06-27 DIAGNOSIS — E049 Nontoxic goiter, unspecified: Secondary | ICD-10-CM

## 2018-06-27 DIAGNOSIS — E010 Iodine-deficiency related diffuse (endemic) goiter: Secondary | ICD-10-CM | POA: Diagnosis not present

## 2018-06-30 ENCOUNTER — Other Ambulatory Visit: Payer: Self-pay | Admitting: Family

## 2018-06-30 ENCOUNTER — Other Ambulatory Visit: Payer: Medicare Other

## 2018-06-30 ENCOUNTER — Ambulatory Visit: Payer: Medicare Other | Admitting: Physical Therapy

## 2018-06-30 ENCOUNTER — Encounter: Payer: Self-pay | Admitting: Physical Therapy

## 2018-06-30 DIAGNOSIS — E049 Nontoxic goiter, unspecified: Secondary | ICD-10-CM

## 2018-06-30 DIAGNOSIS — M5412 Radiculopathy, cervical region: Secondary | ICD-10-CM

## 2018-06-30 DIAGNOSIS — R293 Abnormal posture: Secondary | ICD-10-CM | POA: Diagnosis not present

## 2018-06-30 DIAGNOSIS — M542 Cervicalgia: Secondary | ICD-10-CM

## 2018-06-30 NOTE — Therapy (Signed)
[facility] [place], [place], [address] Phone: [phone]   Fax:  [phone]  Physical Therapy Treatment  Patient Details  Name: Brittany Durham MRN: [id number] Date of Birth: 10/24/1970 Referring Provider (PT): Evelina Dun, FNP   Encounter Date: 06/30/2018  PT End of Session - 06/30/18 1526    Visit Number  2    Number of Visits  12    Date for PT Re-Evaluation  09/23/18    PT Start Time  1446   late arrival and 2 units due to severe pain   PT Stop Time  1524    PT Time Calculation (min)  38 min    Activity Tolerance  Patient limited by pain    Behavior During Therapy  [facility] for tasks assessed/performed       Past Medical History:  Diagnosis Date  . Carotidynia   . DDD (degenerative disc disease), cervical   . Hypertension   . Knee pain    left meniscus tear s/p repair 11/2014  . Seizures ([place])   . Stuttering    patient reports this started after her knee surgery in 11/2014.   Marland Kitchen Thyroid nodule     Past Surgical History:  Procedure Laterality Date  . COLONOSCOPY N/A 08/23/2016   Procedure: COLONOSCOPY;  Surgeon: Daneil Dolin, MD;  [facility];  Service: Endoscopy;  Laterality: N/A;  1:00pm  . ESOPHAGOGASTRODUODENOSCOPY N/A 05/03/2016   Procedure: ESOPHAGOGASTRODUODENOSCOPY (EGD);  Surgeon: Daneil Dolin, MD;  [facility];  Service: Endoscopy;  Laterality: N/A;  . ESOPHAGOGASTRODUODENOSCOPY N/A 08/23/2016   Procedure: ESOPHAGOGASTRODUODENOSCOPY (EGD);  Surgeon: Daneil Dolin, MD;  [facility];  Service: Endoscopy;  Laterality: N/A;  . KNEE ARTHROSCOPY WITH MEDIAL MENISECTOMY Left 12/09/2014   Procedure: KNEE ARTHROSCOPY WITH PARTIAL MEDIAL AND LATERAL MENISECTOMY;  Surgeon: Sanjuana Kava, MD;  Location: [facility];  Service: Orthopedics;  Laterality: Left;  . TUBAL LIGATION    . WISDOM TOOTH EXTRACTION      There were no vitals filed for this visit.  Subjective Assessment -  06/30/18 1530    Subjective  Patient reported pain is severe at 9/10 and began to cry due to pain. Patient also stated her diagnostic ultrasound found an enlarged thyroid in which she will return to her primary care to get bloodwork.    Pertinent History  HTN, Suttering; left knee surgery, history of syncope    Limitations  Reading;Lifting;House hold activities    Diagnostic tests  X-ray, CT Scan See imaging tab    Patient Stated Goals  get out of pain, be able to watch her grandson    Currently in Pain?  Yes    Pain Score  9     Pain Location  Neck    Pain Orientation  Left;Lower    Pain Descriptors / Indicators  Sharp;Numbness;Tingling    Pain Type  Acute pain    Pain Radiating Towards  towards left shoulder and down left arm    Pain Onset  1 to 4 weeks ago    Pain Frequency  Constant         [facility] PT Assessment - 06/30/18 0001      Assessment   Medical Diagnosis  Cervical radiculopathy    Referring Provider (PT)  Evelina Dun, FNP    Onset Date/Surgical Date  06/14/18    Hand Dominance  Right    Next MD Visit  06/26/2018    Prior Therapy  not  for cervical spine                   [facility] Adult PT Treatment/Exercise - 06/30/18 0001      Modalities   Modalities  Electrical Stimulation;Moist Heat;Ultrasound      Moist Heat Therapy   Number Minutes Moist Heat  15 Minutes    Moist Heat Location  Cervical      Electrical Stimulation   Electrical Stimulation Location  left cervical and left UT    Electrical Stimulation Action  IFC    Electrical Stimulation Parameters  80-150 hz x15 mins     Electrical Stimulation Goals  Pain      Ultrasound   Ultrasound Location  left UT and cervical paraspinals    Ultrasound Parameters  1.5 w/cm2, 50%, 1 mhz x10 mins    Ultrasound Goals  Pain      Manual Therapy   Manual Therapy  Soft tissue mobilization    Soft tissue mobilization  Attempted STW/M to L UT and cervical paraspinal but increase pain therefore terminated                   PT Long Term Goals - 06/25/18 1529      PT LONG TERM GOAL #1   Title  Patient will be independent with HEP and its progression.    Time  6    Period  Weeks    Status  New      PT LONG TERM GOAL #2   Title  Patient will demonstrate cervical rotation to 55+ degrees bilaterally to scan environment.     Time  6    Period  Weeks    Status  New      PT LONG TERM GOAL #3   Title  Patient will report ability to perform ADLs with cervical pain less than 4/10.     Time  6    Period  Weeks    Status  New      PT LONG TERM GOAL #4   Title  Patient will report centralization of neurological symptoms to cervical spine to reduce nerve irritation.     Time  6    Period  Weeks    Status  New      PT LONG TERM GOAL #5   Title  Patient will report ability to sleep through the night with cervical pain less than 4/10.     Time  6    Period  Weeks    Status  New            Plan - 06/30/18 1527    Clinical Impression Statement  Patient presented to physical therapy with ongoing left cervical spine and left shoulder pain. PT attempted STW/M to left cervical spine and left UT but patient noted with increased pain with light touch/pressure therefore manual therapy was terminated. Patient was able to tolerate [place] fairly well with no reports of increased pain. Patient with a normal response to modalities and patient did well with sitting with a pillow behind her head. Patient and PT discussed plan of care as pain decreases. Patient reported understanding and agreement.     Clinical Presentation  Stable    Clinical Decision Making  Low    Rehab Potential  Good    PT Frequency  2x / week    PT Duration  6 weeks    PT Treatment/Interventions  ADLs/Self Care Home Management;Cryotherapy;Electrical Stimulation;Ultrasound;Therapeutic activities;Therapeutic exercise;Neuromuscular re-education;Patient/family education;Passive range of  motion;Manual techniques;Traction;Moist  Heat;Dry needling    PT Next Visit Plan  Conservative treatment to decrease pain, PROM, STW/M, then progress with postural exercises, and cervical strengthening, modalities as needed.    Consulted and Agree with Plan of Care  Patient       Patient will benefit from skilled therapeutic intervention in order to improve the following deficits and impairments:  Decreased activity tolerance, Pain, Decreased range of motion, Decreased strength, Impaired UE functional use, Decreased endurance, Impaired tone, Postural dysfunction  Visit Diagnosis: Cervicalgia  Radiculopathy, cervical region  Abnormal posture     Problem List Patient Active Problem List   Diagnosis Date Noted  . Osteoarthritis 11/18/2017  . Insomnia 11/18/2017  . 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 Tear of lateral meniscus of right knee, current 10/28/2017  . 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 Language difficulty 09/25/2016  . Nausea with vomiting 07/18/2016  . PUD (peptic ulcer disease) 07/18/2016  . Slurred speech   . GI bleed 05/02/2016  . Upper GI bleed 05/02/2016  . Sinus tachycardia 05/02/2016  . Acute blood loss anemia 05/02/2016  . Abdominal pain, epigastric 05/02/2016  . 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 Thyroid nodule 12/14/2014  . Left knee pain 12/14/2014  . Seizures ([place])   . Altered mental status 12/12/2014  . Vitamin D deficiency 06/30/2014  . GERD (gastroesophageal reflux disease) 06/29/2014  . SYNCOPE 03/02/2010  . HYPERTENSION, BENIGN 03/01/2010   Gabriela Eves, PT, DPT 06/30/2018, 3:37 PM  [facility] [place], [place], [address] Phone: [phone]   Fax:  [phone]  Name: Adrena Nakamura MRN: [id number] Date of Birth: [birth year]

## 2018-07-01 LAB — THYROID PANEL WITH TSH
Free Thyroxine Index: 2 (ref 1.2–4.9)
T3 Uptake Ratio: 22 % — ABNORMAL LOW (ref 24–39)
T4, Total: 8.9 ug/dL (ref 4.5–12.0)
TSH: 2.66 u[IU]/mL (ref 0.450–4.500)

## 2018-07-02 ENCOUNTER — Encounter: Payer: Self-pay | Admitting: Physical Therapy

## 2018-07-02 ENCOUNTER — Ambulatory Visit: Payer: Medicare Other | Admitting: Physical Therapy

## 2018-07-02 DIAGNOSIS — M542 Cervicalgia: Secondary | ICD-10-CM | POA: Diagnosis not present

## 2018-07-02 DIAGNOSIS — R293 Abnormal posture: Secondary | ICD-10-CM

## 2018-07-02 DIAGNOSIS — M5412 Radiculopathy, cervical region: Secondary | ICD-10-CM

## 2018-07-02 NOTE — Therapy (Signed)
[facility] [place], [place], [address] Phone: [phone]   Fax:  [phone]  Physical Therapy Treatment  Patient Details  Name: Brittany Durham MRN: [id number] Date of Birth: July 21, 1970 Referring Provider (PT): Evelina Dun, FNP   Encounter Date: 07/02/2018  PT End of Session - 07/02/18 1531    Visit Number  3    Number of Visits  12    Date for PT Re-Evaluation  09/23/18    PT Start Time  8546   late arrival   PT Stop Time  1526    PT Time Calculation (min)  40 min    Activity Tolerance  Patient limited by pain    Behavior During Therapy  [facility] for tasks assessed/performed       Past Medical History:  Diagnosis Date  . Carotidynia   . DDD (degenerative disc disease), cervical   . Hypertension   . Knee pain    left meniscus tear s/p repair 11/2014  . Seizures ([place])   . Stuttering    patient reports this started after her knee surgery in 11/2014.   Marland Kitchen Thyroid nodule     Past Surgical History:  Procedure Laterality Date  . COLONOSCOPY N/A 08/23/2016   Procedure: COLONOSCOPY;  Surgeon: Daneil Dolin, MD;  [facility];  Service: Endoscopy;  Laterality: N/A;  1:00pm  . ESOPHAGOGASTRODUODENOSCOPY N/A 05/03/2016   Procedure: ESOPHAGOGASTRODUODENOSCOPY (EGD);  Surgeon: Daneil Dolin, MD;  Location: [facility];  Service: Endoscopy;  Laterality: N/A;  . ESOPHAGOGASTRODUODENOSCOPY N/A 08/23/2016   Procedure: ESOPHAGOGASTRODUODENOSCOPY (EGD);  Surgeon: Daneil Dolin, MD;  [facility];  Service: Endoscopy;  Laterality: N/A;  . KNEE ARTHROSCOPY WITH MEDIAL MENISECTOMY Left 12/09/2014   Procedure: KNEE ARTHROSCOPY WITH PARTIAL MEDIAL AND LATERAL MENISECTOMY;  Surgeon: Sanjuana Kava, MD;  Location: [facility];  Service: Orthopedics;  Laterality: Left;  . TUBAL LIGATION    . WISDOM TOOTH EXTRACTION      There were no vitals filed for this visit.  Subjective Assessment - 07/02/18 1529    Subjective   Patient arrives with decreased pain 5/10.     Pertinent History  HTN, Suttering; left knee surgery, history of syncope    Limitations  Reading;Lifting;House hold activities    Diagnostic tests  X-ray, CT Scan See imaging tab    Patient Stated Goals  get out of pain, be able to watch her grandson    Currently in Pain?  Yes    Pain Score  5     Pain Location  Neck    Pain Orientation  Left;Lower    Pain Descriptors / Indicators  Sharp;Numbness;Tingling    Pain Type  Acute pain    Pain Onset  1 to 4 weeks ago    Pain Frequency  Constant         [facility] PT Assessment - 07/02/18 0001      Assessment   Medical Diagnosis  Cervical radiculopathy    Referring Provider (PT)  Evelina Dun, FNP    Onset Date/Surgical Date  06/14/18    Hand Dominance  Right    Next MD Visit  06/26/2018    Prior Therapy  not for cervical spine                   [facility] Adult PT Treatment/Exercise - 07/02/18 0001      Modalities   Modalities  Electrical Stimulation;Moist Heat;Ultrasound      Moist Heat Therapy  Number Minutes Moist Heat  15 Minutes    Moist Heat Location  Cervical      Electrical Stimulation   Electrical Stimulation Location  left cervical and left UT    Electrical Stimulation Action  IFC    Electrical Stimulation Parameters  80-150 hz x15 mins    Electrical Stimulation Goals  Pain      Ultrasound   Ultrasound Location  left UT and cervical paraspinals    Ultrasound Parameters  1.5 w/cm2, 100%, 1 mhz, x12 mins    Ultrasound Goals  Pain                  PT Long Term Goals - 06/25/18 1529      PT LONG TERM GOAL #1   Title  Patient will be independent with HEP and its progression.    Time  6    Period  Weeks    Status  New      PT LONG TERM GOAL #2   Title  Patient will demonstrate cervical rotation to 55+ degrees bilaterally to scan environment.     Time  6    Period  Weeks    Status  New      PT LONG TERM GOAL #3   Title  Patient will report  ability to perform ADLs with cervical pain less than 4/10.     Time  6    Period  Weeks    Status  New      PT LONG TERM GOAL #4   Title  Patient will report centralization of neurological symptoms to cervical spine to reduce nerve irritation.     Time  6    Period  Weeks    Status  New      PT LONG TERM GOAL #5   Title  Patient will report ability to sleep through the night with cervical pain less than 4/10.     Time  6    Period  Weeks    Status  New            Plan - 07/02/18 1531    Clinical Impression Statement  Treatment shortened due to late arrival. Patient was able to tolerate treatment well and felt [place] improved her pain since last session. Patient was able to tolerate increased time with [place] with a decrease in pain after modality. Patient educated on combo as pain levels decrease as well as beginning exercises once pain is down to a manageable intensity. Patient reported understanding. Normal response to modalities upon removal.    Clinical Presentation  Stable    Clinical Decision Making  Low    Rehab Potential  Good    PT Frequency  2x / week    PT Duration  6 weeks    PT Treatment/Interventions  ADLs/Self Care Home Management;Cryotherapy;Electrical Stimulation;Ultrasound;Therapeutic activities;Therapeutic exercise;Neuromuscular re-education;Patient/family education;Passive range of motion;Manual techniques;Traction;Moist Heat;Dry needling    PT Next Visit Plan  Conservative treatment to decrease pain, PROM, STW/M, then progress with postural exercises, and cervical strengthening, modalities as needed.    Consulted and Agree with Plan of Care  Patient       Patient will benefit from skilled therapeutic intervention in order to improve the following deficits and impairments:  Decreased activity tolerance, Pain, Decreased range of motion, Decreased strength, Impaired UE functional use, Decreased endurance, Impaired tone, Postural dysfunction  Visit  Diagnosis: Cervicalgia  Radiculopathy, cervical region  Abnormal posture     Problem List Patient Active Problem  List   Diagnosis Date Noted  . Osteoarthritis 11/18/2017  . Insomnia 11/18/2017  . 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 Tear of lateral meniscus of right knee, current 10/28/2017  . 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 Language difficulty 09/25/2016  . Nausea with vomiting 07/18/2016  . PUD (peptic ulcer disease) 07/18/2016  . Slurred speech   . GI bleed 05/02/2016  . Upper GI bleed 05/02/2016  . Sinus tachycardia 05/02/2016  . Acute blood loss anemia 05/02/2016  . Abdominal pain, epigastric 05/02/2016  . 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 Thyroid nodule 12/14/2014  . Left knee pain 12/14/2014  . Seizures ([place])   . Altered mental status 12/12/2014  . Vitamin D deficiency 06/30/2014  . GERD (gastroesophageal reflux disease) 06/29/2014  . SYNCOPE 03/02/2010  . HYPERTENSION, BENIGN 03/01/2010   Gabriela Eves, PT, DPT 07/02/2018, 3:35 PM  [facility] [place], [place], [address] Phone: [phone]   Fax:  [phone]  Name: Brittany Durham MRN: [id number] Date of Birth: [birth year]

## 2018-07-06 ENCOUNTER — Emergency Department (HOSPITAL_COMMUNITY)
Admission: EM | Admit: 2018-07-06 | Discharge: 2018-07-07 | Disposition: A | Discharge status: Home / Self Care | Payer: Medicare Other | Attending: Emergency Medicine | Admitting: Emergency Medicine

## 2018-07-06 ENCOUNTER — Other Ambulatory Visit: Payer: Self-pay

## 2018-07-06 ENCOUNTER — Encounter (HOSPITAL_COMMUNITY): Payer: Self-pay | Admitting: Emergency Medicine

## 2018-07-06 DIAGNOSIS — I10 Essential (primary) hypertension: Secondary | ICD-10-CM | POA: Diagnosis not present

## 2018-07-06 DIAGNOSIS — M5412 Radiculopathy, cervical region: Secondary | ICD-10-CM

## 2018-07-06 DIAGNOSIS — M79602 Pain in left arm: Secondary | ICD-10-CM

## 2018-07-06 DIAGNOSIS — Z79899 Other long term (current) drug therapy: Secondary | ICD-10-CM | POA: Insufficient documentation

## 2018-07-06 NOTE — ED Triage Notes (Signed)
Pt reports she was seen for same 06/14/2018 and was referred for physical therapy, pt reports she has been 2 xs for therapy, pt reports she woke up today with her left arm swollen and continues to have the same symptoms as prior (tingling and painful)

## 2018-07-07 ENCOUNTER — Ambulatory Visit (HOSPITAL_COMMUNITY)
Admission: RE | Admit: 2018-07-07 | Discharge: 2018-07-07 | Disposition: A | Discharge status: Home / Self Care | Payer: Medicare Other | Source: Ambulatory Visit | Attending: Emergency Medicine | Admitting: Emergency Medicine

## 2018-07-07 DIAGNOSIS — M79602 Pain in left arm: Secondary | ICD-10-CM | POA: Diagnosis not present

## 2018-07-07 MED ORDER — ENOXAPARIN SODIUM 100 MG/ML ~~LOC~~ SOLN
1.0000 mg/kg | Freq: Once | SUBCUTANEOUS | Status: AC
  Administered 2018-07-07: 95 mg via SUBCUTANEOUS
  Filled 2018-07-07: qty 1

## 2018-07-07 MED ORDER — PREDNISONE 20 MG PO TABS: ORAL_TABLET | ORAL | 0 refills | Status: DC

## 2018-07-07 MED ORDER — DEXAMETHASONE SODIUM PHOSPHATE 10 MG/ML IJ SOLN
10.0000 mg | Freq: Once | INTRAMUSCULAR | Status: AC
  Administered 2018-07-07: 10 mg via INTRAMUSCULAR
  Filled 2018-07-07: qty 1

## 2018-07-07 MED ORDER — CYCLOBENZAPRINE HCL 5 MG PO TABS: 5.0000 mg | ORAL_TABLET | Freq: Three times a day (TID) | ORAL | 0 refills | Status: DC | PRN

## 2018-07-07 NOTE — Discharge Instructions (Addendum)
Use ice and heat for comfort over that sore muscle between your neck and your shoulder.  Please call the number to get your test ordered in the morning.  If your test does not show a blood clot go to the drugstore and get your prescriptions filled.  You should also then consider seeing a specialist about your pain.  You can call Dr. Adah Salvage office to get an appointment.  He is a Publishing rights manager or spine specialist.  However if you do have a blood clot you will be directed back to the ED to get on appropriate medication.

## 2018-07-07 NOTE — ED Notes (Signed)
Pt frustrated because of wait time. Pt says she is going to leave. Informed pt that Dr Tomi Bamberger has signed up for her chart and should be in shortly. Reassurance given. Dr Tomi Bamberger made aware and on way to room to see pt now.

## 2018-07-07 NOTE — ED Provider Notes (Signed)
[facility] EMERGENCY DEPARTMENT Provider Note   CSN: [id number] Arrival date & time: 07/06/18  1904  Time seen 12:13 AM  History   Chief Complaint Chief Complaint  Patient presents with  . Arm Swelling    HPI Brittany Durham is a 48 y.o. female.     HPI patient started complaining of left arm pain about 3 weeks ago.  She states her whole arm hurts from her left side of her neck to her fingertips.  She denies any known injury or change in activity.  She states movement or bending her joints makes her pain worse.  She states nothing has helped her pain.  She states she is dropping things of her left hand.  She has tried Tylenol and states her doctor referred her to physical therapy, she went twice this past week.  The last time was Thursday, February 20.  Her next appointment is on February 25.  She complains of tingling in her arm and states today her arm was swollen.  She states her doctor told her this was from an enlarged thyroid gland.  She states she did have DVT a few years ago and her father had DVT in his legs after he was diagnosed of cancer.  PCP Sharion Balloon, FNP   Past Medical History:  Diagnosis Date  . Carotidynia   . DDD (degenerative disc disease), cervical   . Hypertension   . Knee pain    left meniscus tear s/p repair 11/2014  . Seizures ([place])   . Stuttering    patient reports this started after her knee surgery in 11/2014.   Marland Kitchen Thyroid nodule     Patient Active Problem List   Diagnosis Date Noted  . Osteoarthritis 11/18/2017  . Insomnia 11/18/2017  . 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 Tear of lateral meniscus of right knee, current 10/28/2017  . 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 Language difficulty 09/25/2016  . Nausea with vomiting 07/18/2016  . PUD (peptic ulcer disease) 07/18/2016  . Slurred speech   . GI bleed 05/02/2016  . Upper GI bleed 05/02/2016  . Sinus tachycardia 05/02/2016  . Acute blood loss anemia  05/02/2016  . Abdominal pain, epigastric 05/02/2016  . 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 Thyroid nodule 12/14/2014  . Left knee pain 12/14/2014  . Seizures ([place])   . Altered mental status 12/12/2014  . Vitamin D deficiency 06/30/2014  . GERD (gastroesophageal reflux disease) 06/29/2014  . SYNCOPE 03/02/2010  . HYPERTENSION, BENIGN 03/01/2010    Past Surgical History:  Procedure Laterality Date  . COLONOSCOPY N/A 08/23/2016   Procedure: COLONOSCOPY;  Surgeon: Daneil Dolin, MD;  [facility];  Service: Endoscopy;  Laterality: N/A;  1:00pm  . ESOPHAGOGASTRODUODENOSCOPY N/A 05/03/2016   Procedure: ESOPHAGOGASTRODUODENOSCOPY (EGD);  Surgeon: Daneil Dolin, MD;  [facility];  Service: Endoscopy;  Laterality: N/A;  . ESOPHAGOGASTRODUODENOSCOPY N/A 08/23/2016   Procedure: ESOPHAGOGASTRODUODENOSCOPY (EGD);  Surgeon: Daneil Dolin, MD;  [facility];  Service: Endoscopy;  Laterality: N/A;  . KNEE ARTHROSCOPY WITH MEDIAL MENISECTOMY Left 12/09/2014   Procedure: KNEE ARTHROSCOPY WITH PARTIAL MEDIAL AND LATERAL MENISECTOMY;  Surgeon: Sanjuana Kava, MD;  Location: [facility];  Service: Orthopedics;  Laterality: Left;  . TUBAL LIGATION    . WISDOM TOOTH EXTRACTION       OB History    Gravida  1   Para  1   Term  1   Preterm  AB      Living  1     SAB      TAB      Ectopic      Multiple      Live Births               Home Medications    Prior to Admission medications   Medication Sig Start Date End Date Taking? Authorizing Provider  Acetaminophen (TYLENOL PO) Take by mouth.    [provider]  amLODipine (NORVASC) 10 MG tablet Take 1 tablet (10 mg total) by mouth daily. 04/17/18   Sharion Balloon, FNP  cyclobenzaprine (FLEXERIL) 5 MG tablet Take 1 tablet (5 mg total) by mouth 3 (three) times daily as needed. 07/07/18   Rolland Porter, MD  diclofenac (VOLTAREN) 75 MG EC tablet Take 1 tablet (75 mg total) by mouth 2 (two) times daily.  06/19/18   Evelina Dun A, FNP  gabapentin (NEURONTIN) 300 MG capsule Take 1 capsule (300 mg total) by mouth 3 (three) times daily. 12/23/17   Sharion Balloon, FNP  HYDROcodone-acetaminophen (NORCO/VICODIN) 5-325 MG tablet 1 or 2 tabs PO q8 hours prn pain 06/14/18   Francine Graven, DO  lisinopril (PRINIVIL,ZESTRIL) 20 MG tablet Take 1 tablet (20 mg total) by mouth daily. 01/16/18   Sharion Balloon, FNP  methocarbamol (ROBAXIN) 500 MG tablet Take 2 tablets (1,000 mg total) by mouth 4 (four) times daily as needed for muscle spasms (muscle spasm/pain). 06/14/18   Francine Graven, DO  pantoprazole (PROTONIX) 40 MG tablet TAKE (1) TABLET TWICE A DAY BEFORE MEALS. 04/18/18   Annitta Needs, NP  predniSONE (DELTASONE) 20 MG tablet Take 3 po QD x 3d , then 2 po QD x 3d then 1 po QD x 3d 07/07/18   Rolland Porter, MD  Vitamin D, Ergocalciferol, (DRISDOL) 1.25 MG (50000 UT) CAPS capsule TAKE 1 CAPSULE ONCE A WEEK 05/09/18   Sharion Balloon, FNP    Family History Family History  Problem Relation Age of Onset  . Hypertension Mother   . Hypertension Brother   . Colon cancer Neg Hx     Social History Social History   Tobacco Use  . Smoking status: Never Smoker  . Smokeless tobacco: Never Used  Substance Use Topics  . Alcohol use: No  . Drug use: No  On disability for speech impediment   Allergies   Patient has no known allergies.   Review of Systems Review of Systems  All other systems reviewed and are negative.    Physical Exam Updated Vital Signs BP (!) 145/115 (BP Location: Right Arm)   Pulse (!) 112   Temp 97.7 F (36.5 C) (Oral)   Resp 18   Ht 5\' 7"  (1.702 m)   Wt 93.9 kg   SpO2 99%   BMI 32.42 kg/m   Physical Exam Vitals signs and nursing note reviewed.  Constitutional:      General: She is not in acute distress.    Appearance: Normal appearance. She is not ill-appearing or toxic-appearing.  HENT:     Head: Normocephalic and atraumatic.     Right Ear: External ear  normal.     Left Ear: External ear normal.     Nose: Nose normal.  Eyes:     Extraocular Movements: Extraocular movements intact.     Conjunctiva/sclera: Conjunctivae normal.  Neck:     Musculoskeletal: Muscular tenderness present.     Comments: Patient is nontender in her midline cervical  spine, she is very tender over her left trapezius muscle. Cardiovascular:     Rate and Rhythm: Normal rate.  Pulmonary:     Effort: Pulmonary effort is normal. No respiratory distress.  Musculoskeletal:        General: Tenderness present. No swelling or deformity.     Comments: When I check patient strength she has normal dorsiflexion and extension of her wrist, flexion extension of her elbow, and abduction/abduction of her left shoulder.  She states it does cause pain in the left trapezius muscle and throughout her arm.  She has good distal pulses and capillary refill.  I do not see any obvious swelling of her arms when I compare them however she feels like she has swelling in the wrist and hand.  Skin:    General: Skin is warm and dry.     Findings: No erythema.  Neurological:     General: No focal deficit present.     Mental Status: She is alert and oriented to person, place, and time.     Cranial Nerves: No cranial nerve deficit.  Psychiatric:        Mood and Affect: Mood normal.        Behavior: Behavior normal.        Thought Content: Thought content normal.      ED Treatments / Results  Labs (all labs ordered are listed, but only abnormal results are displayed) Labs Reviewed - No data to display  EKG None  Radiology No results found.   Dg Chest 2 View  Result Date: 06/14/2018 CLINICAL DATA:  Shortness of breath . IMPRESSION: No active cardiopulmonary disease. Electronically Signed   By: Misty Stanley M.D.   On: 06/14/2018 19:17   Ct Head Wo Contrast  Ct Cervical Spine Wo Contrast  Result Date: 06/14/2018 CLINICAL DATA:  Per ED notes: Pt c/o LT arm tingling and pain that began  around 0800 yesterday morning. Pt also c/o mild SOB. Denies any other neuro deficits. EXAM: CT HEAD WITHOUT CONTRAST CT CERVICAL SPINE WITHOUT CONTRAST TECHNIQUE: Multidetector CT imaging of the head and cervical spine was performed following the standard protocol without intravenous contrast. Multiplanar CT image reconstructions of the cervical spine were also generated.  IMPRESSION: HEAD CT 1. Normal enhanced CT scan of the brain. CERVICAL CT 1. No fracture or acute finding. Disc degenerative changes as described. Mild degrees of left neural foraminal narrowing. 2. Enlarged thyroid with probable nodules. Consider non emergent follow-up thyroid ultrasound for further assessment. Electronically Signed   By: Lajean Manes M.D.   On: 06/14/2018 19:13   US Soft Tissue Head/neck  Result Date: 06/27/2018 CLINICAL DATA:  Palpable abnormality. Thyromegaly on physical examination._____________________________________________________ No discrete nodules are seen within the thyroid gland. IMPRESSION: Markedly heterogeneous and borderline enlarged thyroid gland without discrete nodule or mass. Findings are nonspecific though could be seen in the setting of a thyroiditis. The above is in keeping with the ACR TI-RADS recommendations - J Am Coll Radiol 2017;14:587-595. Electronically Signed   By: Sandi Mariscal M.D.   On: 06/27/2018 12:22   Dg Shoulder Left  Result Date: 06/14/2018 CLINICAL DATA:  Shortness of breath. Left shoulder and arm pain with tingling down the left arm.IMPRESSION: Negative. Electronically Signed   By: Lucienne Capers M.D.   On: 06/14/2018 19:17    Procedures Procedures (including critical care time)  Medications Ordered in ED Medications  enoxaparin (LOVENOX) injection 95 mg (has no administration in time range)  dexamethasone (DECADRON) injection 10  mg (has no administration in time range)     Initial Impression / Assessment and Plan / ED Course  I have reviewed the triage vital signs  and the nursing notes.  Pertinent labs & imaging results that were available during my care of the patient were reviewed by me and considered in my medical decision making (see chart for details).      Patient has already been evaluated for the same problem both in the ED and by her primary care doctor.  I have reviewed her prior studies including a negative d-dimer on February 1.  I explained to her she was very tender over her left trapezius muscle and that the nerves from the neck travel through that muscle go down to the fingers and it may just be a matter of this muscle inflammation pressing on those nerves.  She does have a history of DVT.  I decided to give her Lovenox and a shot of Decadron.  She can call in the morning to get a Doppler ultrasound done of her upper extremity.  If it is negative she can get the steroids and the muscle relaxer filled.  She should continue her physical therapy through her primary care doctor.  However if she does have DVT she will be directed back to the ED to get started on appropriate blood thinners.    Final Clinical Impressions(s) / ED Diagnoses   Final diagnoses:  Left arm pain  Cervical radiculopathy    ED Discharge Orders         Ordered    predniSONE (DELTASONE) 20 MG tablet     07/07/18 0030    cyclobenzaprine (FLEXERIL) 5 MG tablet  3 times daily PRN     07/07/18 0030          Plan discharge  Rolland Porter, MD, Barbette Or, MD 07/07/18 [phone]

## 2018-07-08 ENCOUNTER — Ambulatory Visit: Payer: Medicare Other | Admitting: Physical Therapy

## 2018-07-10 ENCOUNTER — Ambulatory Visit: Payer: Medicare Other | Admitting: Physical Therapy

## 2018-07-10 ENCOUNTER — Encounter: Payer: Self-pay | Admitting: Physical Therapy

## 2018-07-10 DIAGNOSIS — M542 Cervicalgia: Secondary | ICD-10-CM

## 2018-07-10 DIAGNOSIS — M5412 Radiculopathy, cervical region: Secondary | ICD-10-CM

## 2018-07-10 DIAGNOSIS — R293 Abnormal posture: Secondary | ICD-10-CM

## 2018-07-10 NOTE — Therapy (Signed)
[facility] [place], [place], [address] Phone: [phone]   Fax:  [phone]  Physical Therapy Treatment  Patient Details  Name: Brittany Durham MRN: [id number] Date of Birth: Jun 11, 1970 Referring Provider (PT): Evelina Dun, FNP   Encounter Date: 07/10/2018  PT End of Session - 07/10/18 1517    Visit Number  4    Number of Visits  12    Date for PT Re-Evaluation  09/23/18    PT Start Time  1520    PT Stop Time  1603    PT Time Calculation (min)  43 min    Activity Tolerance  Patient tolerated treatment well    Behavior During Therapy  [facility] for tasks assessed/performed       Past Medical History:  Diagnosis Date  . Carotidynia   . DDD (degenerative disc disease), cervical   . Hypertension   . Knee pain    left meniscus tear s/p repair 11/2014  . Seizures ([place])   . Stuttering    patient reports this started after her knee surgery in 11/2014.   Marland Kitchen Thyroid nodule     Past Surgical History:  Procedure Laterality Date  . COLONOSCOPY N/A 08/23/2016   Procedure: COLONOSCOPY;  Surgeon: Daneil Dolin, MD;  [facility];  Service: Endoscopy;  Laterality: N/A;  1:00pm  . ESOPHAGOGASTRODUODENOSCOPY N/A 05/03/2016   Procedure: ESOPHAGOGASTRODUODENOSCOPY (EGD);  Surgeon: Daneil Dolin, MD;  [facility];  Service: Endoscopy;  Laterality: N/A;  . ESOPHAGOGASTRODUODENOSCOPY N/A 08/23/2016   Procedure: ESOPHAGOGASTRODUODENOSCOPY (EGD);  Surgeon: Daneil Dolin, MD;  [facility];  Service: Endoscopy;  Laterality: N/A;  . KNEE ARTHROSCOPY WITH MEDIAL MENISECTOMY Left 12/09/2014   Procedure: KNEE ARTHROSCOPY WITH PARTIAL MEDIAL AND LATERAL MENISECTOMY;  Surgeon: Sanjuana Kava, MD;  Location: [facility];  Service: Orthopedics;  Laterality: Left;  . TUBAL LIGATION    . WISDOM TOOTH EXTRACTION      There were no vitals filed for this visit.  Subjective Assessment - 07/10/18 1517    Subjective  Reports  still having L cervical pain and pain down to R hand. Took pain med and muscle relaxer prior to today's treatment. Has MRI scheduled for 07/29/2018.    Pertinent History  HTN, Suttering; left knee surgery, history of syncope    Limitations  Reading;Lifting;House hold activities    Diagnostic tests  X-ray, CT Scan See imaging tab    Patient Stated Goals  get out of pain, be able to watch her grandson    Currently in Pain?  Yes    Pain Score  5     Pain Location  Neck    Pain Orientation  Left    Pain Descriptors / Indicators  Tingling    Pain Type  Acute pain    Pain Radiating Towards  down LUE to hand    Pain Onset  1 to 4 weeks ago    Pain Frequency  Intermittent         [facility] PT Assessment - 07/10/18 0001      Assessment   Medical Diagnosis  Cervical radiculopathy    Referring Provider (PT)  Evelina Dun, FNP    Onset Date/Surgical Date  06/14/18    Hand Dominance  Right    Next MD Visit  MRI 07/29/2018    Prior Therapy  not for cervical spine      Precautions   Precautions  None  [facility] Adult PT Treatment/Exercise - 07/10/18 0001      Modalities   Modalities  Electrical Stimulation;Moist Heat;Ultrasound      Moist Heat Therapy   Number Minutes Moist Heat  15 Minutes    Moist Heat Location  Cervical      Electrical Stimulation   Electrical Stimulation Location  L UT    Electrical Stimulation Action  Pre-Mod    Electrical Stimulation Parameters  80-150 hz x15 min    Electrical Stimulation Goals  Pain;Tone      Ultrasound   Ultrasound Location  L UT, cervical paraspinals    Ultrasound Parameters  1.5 w/cm2, 100%, 1 mhz x10 min    Ultrasound Goals  Pain      Manual Therapy   Manual Therapy  Myofascial release    Myofascial Release  MFR/STW to L UT, cervical paraspinals to reduce muscle tension and pain                  PT Long Term Goals - 06/25/18 1529      PT LONG TERM GOAL #1   Title  Patient will be independent with  HEP and its progression.    Time  6    Period  Weeks    Status  New      PT LONG TERM GOAL #2   Title  Patient will demonstrate cervical rotation to 55+ degrees bilaterally to scan environment.     Time  6    Period  Weeks    Status  New      PT LONG TERM GOAL #3   Title  Patient will report ability to perform ADLs with cervical pain less than 4/10.     Time  6    Period  Weeks    Status  New      PT LONG TERM GOAL #4   Title  Patient will report centralization of neurological symptoms to cervical spine to reduce nerve irritation.     Time  6    Period  Weeks    Status  New      PT LONG TERM GOAL #5   Title  Patient will report ability to sleep through the night with cervical pain less than 4/10.     Time  6    Period  Weeks    Status  New            Plan - 07/10/18 1556    Clinical Impression Statement  Patient presented in clinic with reports of L cervical pain with radiation down LUE. Patient presented in clinic with continued muscle tightness of L UT and cervical paraspinals. Increased inflammation noted in the L posterior triangle of the neck. Multiple TPs noted in L UT today as well. No complaints of any increased pain or tingling reported by patient. Normal modalities response noted following removal of the modalities.    Rehab Potential  Good    PT Frequency  2x / week    PT Duration  6 weeks    PT Treatment/Interventions  ADLs/Self Care Home Management;Cryotherapy;Electrical Stimulation;Ultrasound;Therapeutic activities;Therapeutic exercise;Neuromuscular re-education;Patient/family education;Passive range of motion;Manual techniques;Traction;Moist Heat;Dry needling    PT Next Visit Plan  Conservative treatment to decrease pain, PROM, STW/M, then progress with postural exercises, and cervical strengthening, modalities as needed.    PT Home Exercise Plan  see patient education section    Consulted and Agree with Plan of Care  Patient       Patient will benefit  from skilled therapeutic intervention in order to improve the following deficits and impairments:  Decreased activity tolerance, Pain, Decreased range of motion, Decreased strength, Impaired UE functional use, Decreased endurance, Impaired tone, Postural dysfunction  Visit Diagnosis: Cervicalgia  Radiculopathy, cervical region  Abnormal posture     Problem List Patient Active Problem List   Diagnosis Date Noted  . Osteoarthritis 11/18/2017  . Insomnia 11/18/2017  . 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 Tear of lateral meniscus of right knee, current 10/28/2017  . 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 Language difficulty 09/25/2016  . Nausea with vomiting 07/18/2016  . PUD (peptic ulcer disease) 07/18/2016  . Slurred speech   . GI bleed 05/02/2016  . Upper GI bleed 05/02/2016  . Sinus tachycardia 05/02/2016  . Acute blood loss anemia 05/02/2016  . Abdominal pain, epigastric 05/02/2016  . 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 Thyroid nodule 12/14/2014  . Left knee pain 12/14/2014  . Seizures ([place])   . Altered mental status 12/12/2014  . Vitamin D deficiency 06/30/2014  . GERD (gastroesophageal reflux disease) 06/29/2014  . SYNCOPE 03/02/2010  . HYPERTENSION, BENIGN 03/01/2010    Standley Brooking, PTA 07/10/2018, 4:14 PM  [facility] [place], [place], [address] Phone: [phone]   Fax:  [phone]  Name: Anyjah Roundtree MRN: [id number] Date of Birth: 1971-03-30

## 2018-07-15 ENCOUNTER — Other Ambulatory Visit: Payer: Self-pay | Admitting: Family

## 2018-07-15 ENCOUNTER — Ambulatory Visit: Payer: Medicare Other | Attending: Family | Admitting: *Deleted

## 2018-07-15 DIAGNOSIS — M542 Cervicalgia: Secondary | ICD-10-CM | POA: Insufficient documentation

## 2018-07-15 DIAGNOSIS — R293 Abnormal posture: Secondary | ICD-10-CM | POA: Diagnosis not present

## 2018-07-15 DIAGNOSIS — M5412 Radiculopathy, cervical region: Secondary | ICD-10-CM | POA: Diagnosis not present

## 2018-07-15 DIAGNOSIS — I10 Essential (primary) hypertension: Secondary | ICD-10-CM

## 2018-07-15 NOTE — Therapy (Signed)
[facility] [place], [place], [address] Phone: [phone]   Fax:  [phone]  Physical Therapy Treatment  Patient Details  Name: Brittany Durham MRN: [id number] Date of Birth: 1971/04/02 Referring Provider (PT): Evelina Dun, FNP   Encounter Date: 07/15/2018  PT End of Session - 07/15/18 1750    Visit Number  5    Number of Visits  12    Date for PT Re-Evaluation  09/23/18    PT Start Time  5885    PT Stop Time  0277    PT Time Calculation (min)  49 min       Past Medical History:  Diagnosis Date  . Carotidynia   . DDD (degenerative disc disease), cervical   . Hypertension   . Knee pain    left meniscus tear s/p repair 11/2014  . Seizures ([place])   . Stuttering    patient reports this started after her knee surgery in 11/2014.   Marland Kitchen Thyroid nodule     Past Surgical History:  Procedure Laterality Date  . COLONOSCOPY N/A 08/23/2016   Procedure: COLONOSCOPY;  Surgeon: Daneil Dolin, MD;  [facility];  Service: Endoscopy;  Laterality: N/A;  1:00pm  . ESOPHAGOGASTRODUODENOSCOPY N/A 05/03/2016   Procedure: ESOPHAGOGASTRODUODENOSCOPY (EGD);  Surgeon: Daneil Dolin, MD;  [facility];  Service: Endoscopy;  Laterality: N/A;  . ESOPHAGOGASTRODUODENOSCOPY N/A 08/23/2016   Procedure: ESOPHAGOGASTRODUODENOSCOPY (EGD);  Surgeon: Daneil Dolin, MD;  [facility];  Service: Endoscopy;  Laterality: N/A;  . KNEE ARTHROSCOPY WITH MEDIAL MENISECTOMY Left 12/09/2014   Procedure: KNEE ARTHROSCOPY WITH PARTIAL MEDIAL AND LATERAL MENISECTOMY;  Surgeon: Sanjuana Kava, MD;  Location: [facility];  Service: Orthopedics;  Laterality: Left;  . TUBAL LIGATION    . WISDOM TOOTH EXTRACTION      There were no vitals filed for this visit.                    [facility] Adult PT Treatment/Exercise - 07/15/18 0001      Modalities   Modalities  Electrical Stimulation;Moist Heat;Ultrasound      Moist Heat  Therapy   Number Minutes Moist Heat  15 Minutes    Moist Heat Location  Cervical      Electrical Stimulation   Electrical Stimulation Location  L UT    Electrical Stimulation Action  IFC x 15 mins 80-150hz     Electrical Stimulation Goals  Pain;Tone      Ultrasound   Ultrasound Location  LT UT and cervical    Ultrasound Parameters  1.5 w/cm2 x12 mins    Ultrasound Goals  Pain      Manual Therapy   Manual Therapy  Myofascial release    Myofascial Release  MFR/STW to L UT, cervical paraspinals to reduce muscle tension and pain                  PT Long Term Goals - 06/25/18 1529      PT LONG TERM GOAL #1   Title  Patient will be independent with HEP and its progression.    Time  6    Period  Weeks    Status  New      PT LONG TERM GOAL #2   Title  Patient will demonstrate cervical rotation to 55+ degrees bilaterally to scan environment.     Time  6    Period  Weeks    Status  New  PT LONG TERM GOAL #3   Title  Patient will report ability to perform ADLs with cervical pain less than 4/10.     Time  6    Period  Weeks    Status  New      PT LONG TERM GOAL #4   Title  Patient will report centralization of neurological symptoms to cervical spine to reduce nerve irritation.     Time  6    Period  Weeks    Status  New      PT LONG TERM GOAL #5   Title  Patient will report ability to sleep through the night with cervical pain less than 4/10.     Time  6    Period  Weeks    Status  New            Plan - 07/15/18 1752    Clinical Impression Statement  Pt arrived today doing fair, but still with LT Utrap and cervical pain. [place] combo and STW were performed to LT side cervical paras and UT. Notable tightness in anterior aspect UT and along cervical paras. during STW, but decreased end of session. Normal modality response.    Rehab Potential  Good    PT Frequency  2x / week    PT Duration  6 weeks    PT Treatment/Interventions  ADLs/Self Care Home  Management;Cryotherapy;Electrical Stimulation;Ultrasound;Therapeutic activities;Therapeutic exercise;Neuromuscular re-education;Patient/family education;Passive range of motion;Manual techniques;Traction;Moist Heat;Dry needling    PT Next Visit Plan  Conservative treatment to decrease pain, PROM, STW/M, then progress with postural exercises, and cervical strengthening, modalities as needed.    PT Home Exercise Plan  see patient education section    Consulted and Agree with Plan of Care  Patient       Patient will benefit from skilled therapeutic intervention in order to improve the following deficits and impairments:  Decreased activity tolerance, Pain, Decreased range of motion, Decreased strength, Impaired UE functional use, Decreased endurance, Impaired tone, Postural dysfunction  Visit Diagnosis: Cervicalgia  Radiculopathy, cervical region  Abnormal posture     Problem List Patient Active Problem List   Diagnosis Date Noted  . Osteoarthritis 11/18/2017  . Insomnia 11/18/2017  . 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 Tear of lateral meniscus of right knee, current 10/28/2017  . 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 Language difficulty 09/25/2016  . Nausea with vomiting 07/18/2016  . PUD (peptic ulcer disease) 07/18/2016  . Slurred speech   . GI bleed 05/02/2016  . Upper GI bleed 05/02/2016  . Sinus tachycardia 05/02/2016  . Acute blood loss anemia 05/02/2016  . Abdominal pain, epigastric 05/02/2016  . 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 Thyroid nodule 12/14/2014  . Left knee pain 12/14/2014  . Seizures ([place])   . Altered mental status 12/12/2014  . Vitamin D deficiency 06/30/2014  . GERD (gastroesophageal reflux disease) 06/29/2014  . SYNCOPE 03/02/2010  . HYPERTENSION, BENIGN 03/01/2010    Piero Mustard,CHRIS, PTA 07/15/2018, 6:07 PM  [facility] [place], [place], [address] Phone: [phone]    Fax:  [phone]  Name: Valera Vallas MRN: [id number] Date of Birth: 17-Dec-1970

## 2018-07-17 ENCOUNTER — Ambulatory Visit: Payer: Medicare Other | Admitting: Physical Therapy

## 2018-07-17 DIAGNOSIS — M542 Cervicalgia: Secondary | ICD-10-CM | POA: Diagnosis not present

## 2018-07-17 DIAGNOSIS — M5412 Radiculopathy, cervical region: Secondary | ICD-10-CM | POA: Diagnosis not present

## 2018-07-17 DIAGNOSIS — R293 Abnormal posture: Secondary | ICD-10-CM

## 2018-07-17 NOTE — Therapy (Signed)
[facility] [place], [place], [address] Phone: [phone]   Fax:  [phone]  Physical Therapy Treatment  Patient Details  Name: Brittany Durham MRN: [id number] Date of Birth: 1970-09-05 Referring Provider (PT): Evelina Dun, FNP   Encounter Date: 07/17/2018  PT End of Session - 07/17/18 1518    Visit Number  6    Number of Visits  12    Date for PT Re-Evaluation  09/23/18    PT Start Time  1518    PT Stop Time  1558    PT Time Calculation (min)  40 min    Activity Tolerance  Patient tolerated treatment well    Behavior During Therapy  [facility] for tasks assessed/performed       Past Medical History:  Diagnosis Date  . Carotidynia   . DDD (degenerative disc disease), cervical   . Hypertension   . Knee pain    left meniscus tear s/p repair 11/2014  . Seizures ([place])   . Stuttering    patient reports this started after her knee surgery in 11/2014.   Marland Kitchen Thyroid nodule     Past Surgical History:  Procedure Laterality Date  . COLONOSCOPY N/A 08/23/2016   Procedure: COLONOSCOPY;  Surgeon: Daneil Dolin, MD;  [facility];  Service: Endoscopy;  Laterality: N/A;  1:00pm  . ESOPHAGOGASTRODUODENOSCOPY N/A 05/03/2016   Procedure: ESOPHAGOGASTRODUODENOSCOPY (EGD);  Surgeon: Daneil Dolin, MD;  [facility];  Service: Endoscopy;  Laterality: N/A;  . ESOPHAGOGASTRODUODENOSCOPY N/A 08/23/2016   Procedure: ESOPHAGOGASTRODUODENOSCOPY (EGD);  Surgeon: Daneil Dolin, MD;  [facility];  Service: Endoscopy;  Laterality: N/A;  . KNEE ARTHROSCOPY WITH MEDIAL MENISECTOMY Left 12/09/2014   Procedure: KNEE ARTHROSCOPY WITH PARTIAL MEDIAL AND LATERAL MENISECTOMY;  Surgeon: Sanjuana Kava, MD;  Location: [facility];  Service: Orthopedics;  Laterality: Left;  . TUBAL LIGATION    . WISDOM TOOTH EXTRACTION      There were no vitals filed for this visit.  Subjective Assessment - 07/17/18 1517    Subjective  Reports  "feeling good today."    Pertinent History  HTN, Suttering; left knee surgery, history of syncope    Limitations  Reading;Lifting;House hold activities    Diagnostic tests  X-ray, CT Scan See imaging tab    Patient Stated Goals  get out of pain, be able to watch her grandson    Currently in Pain?  Yes    Pain Score  4     Pain Location  Neck    Pain Orientation  Left         [facility] PT Assessment - 07/17/18 0001      Assessment   Medical Diagnosis  Cervical radiculopathy    Referring Provider (PT)  Evelina Dun, FNP    Onset Date/Surgical Date  06/14/18    Hand Dominance  Right    Next MD Visit  MRI 07/29/2018    Prior Therapy  not for cervical spine      Precautions   Precautions  None                   [facility] Adult PT Treatment/Exercise - 07/17/18 0001      Modalities   Modalities  Electrical Stimulation;Moist Heat;Ultrasound      Moist Heat Therapy   Number Minutes Moist Heat  15 Minutes    Moist Heat Location  Cervical      Electrical Stimulation   Electrical Stimulation Location  L UT    Electrical Stimulation Action  Pre-Mod    Electrical Stimulation Parameters  80-150 hz x15 min    Electrical Stimulation Goals  Pain;Tone      Ultrasound   Ultrasound Location  L UT, cervical paraspinals    Ultrasound Parameters  1.5 w/cm2, 100%, 1 mhz x10 min    Ultrasound Goals  Pain      Manual Therapy   Manual Therapy  Myofascial release    Myofascial Release  MFR/STW to L UT, cervical paraspinals to reduce muscle tension and pain                  PT Long Term Goals - 06/25/18 1529      PT LONG TERM GOAL #1   Title  Patient will be independent with HEP and its progression.    Time  6    Period  Weeks    Status  New      PT LONG TERM GOAL #2   Title  Patient will demonstrate cervical rotation to 55+ degrees bilaterally to scan environment.     Time  6    Period  Weeks    Status  New      PT LONG TERM GOAL #3   Title  Patient will report  ability to perform ADLs with cervical pain less than 4/10.     Time  6    Period  Weeks    Status  New      PT LONG TERM GOAL #4   Title  Patient will report centralization of neurological symptoms to cervical spine to reduce nerve irritation.     Time  6    Period  Weeks    Status  New      PT LONG TERM GOAL #5   Title  Patient will report ability to sleep through the night with cervical pain less than 4/10.     Time  6    Period  Weeks    Status  New            Plan - 07/17/18 1553    Clinical Impression Statement  Patient presented in clinic with less L UT pain. Continues to experience tingling, shooting pain down LUE intermittantly per patient report. Moderate muscle tone palpable in L UT musculature. Normal modalities response noted following removal of the modalities. Patient educated regarding symptoms down LUE and educated regarding mechanical traction. Patient interested in trying mechanical traction in next treatment.    PT Treatment/Interventions  ADLs/Self Care Home Management;Cryotherapy;Electrical Stimulation;Ultrasound;Therapeutic activities;Therapeutic exercise;Neuromuscular re-education;Patient/family education;Passive range of motion;Manual techniques;Traction;Moist Heat;Dry needling    PT Next Visit Plan  Continue as symptoms dictate. Attempt mechanical cervical traction.    PT Home Exercise Plan  see patient education section    Consulted and Agree with Plan of Care  Patient       Patient will benefit from skilled therapeutic intervention in order to improve the following deficits and impairments:  Decreased activity tolerance, Pain, Decreased range of motion, Decreased strength, Impaired UE functional use, Decreased endurance, Impaired tone, Postural dysfunction  Visit Diagnosis: Cervicalgia  Radiculopathy, cervical region  Abnormal posture     Problem List Patient Active Problem List   Diagnosis Date Noted  . Osteoarthritis 11/18/2017  . Insomnia  11/18/2017  . 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 Tear of lateral meniscus of right knee, current 10/28/2017  . 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 Language difficulty 09/25/2016  . Nausea  with vomiting 07/18/2016  . PUD (peptic ulcer disease) 07/18/2016  . Slurred speech   . GI bleed 05/02/2016  . Upper GI bleed 05/02/2016  . Sinus tachycardia 05/02/2016  . Acute blood loss anemia 05/02/2016  . Abdominal pain, epigastric 05/02/2016  . 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 Thyroid nodule 12/14/2014  . Left knee pain 12/14/2014  . Seizures ([place])   . Altered mental status 12/12/2014  . Vitamin D deficiency 06/30/2014  . GERD (gastroesophageal reflux disease) 06/29/2014  . SYNCOPE 03/02/2010  . HYPERTENSION, BENIGN 03/01/2010    Standley Brooking, PTA 07/17/2018, 4:01 PM  [facility] [place], [place], [address] Phone: [phone]   Fax:  [phone]  Name: Brittany Durham MRN: [id number] Date of Birth: [birth year]

## 2018-07-22 ENCOUNTER — Ambulatory Visit: Payer: Medicare Other | Admitting: Physical Therapy

## 2018-07-22 DIAGNOSIS — R293 Abnormal posture: Secondary | ICD-10-CM | POA: Diagnosis not present

## 2018-07-22 DIAGNOSIS — M542 Cervicalgia: Secondary | ICD-10-CM | POA: Diagnosis not present

## 2018-07-22 DIAGNOSIS — M5412 Radiculopathy, cervical region: Secondary | ICD-10-CM | POA: Diagnosis not present

## 2018-07-22 NOTE — Therapy (Signed)
[facility] [place], [place], [address] Phone: [phone]   Fax:  [phone]  Physical Therapy Treatment  Patient Details  Name: Brittany Durham MRN: [id number] Date of Birth: 21-Feb-1971 Referring Provider (PT): Evelina Dun, FNP   Encounter Date: 07/22/2018  PT End of Session - 07/22/18 1521    Visit Number  7    Number of Visits  12    Date for PT Re-Evaluation  09/23/18    PT Start Time  1522    PT Stop Time  1612    PT Time Calculation (min)  50 min    Activity Tolerance  Patient tolerated treatment well    Behavior During Therapy  [facility] for tasks assessed/performed       Past Medical History:  Diagnosis Date  . Carotidynia   . DDD (degenerative disc disease), cervical   . Hypertension   . Knee pain    left meniscus tear s/p repair 11/2014  . Seizures ([place])   . Stuttering    patient reports this started after her knee surgery in 11/2014.   Marland Kitchen Thyroid nodule     Past Surgical History:  Procedure Laterality Date  . COLONOSCOPY N/A 08/23/2016   Procedure: COLONOSCOPY;  Surgeon: Daneil Dolin, MD;  [facility];  Service: Endoscopy;  Laterality: N/A;  1:00pm  . ESOPHAGOGASTRODUODENOSCOPY N/A 05/03/2016   Procedure: ESOPHAGOGASTRODUODENOSCOPY (EGD);  Surgeon: Daneil Dolin, MD;  [facility];  Service: Endoscopy;  Laterality: N/A;  . ESOPHAGOGASTRODUODENOSCOPY N/A 08/23/2016   Procedure: ESOPHAGOGASTRODUODENOSCOPY (EGD);  Surgeon: Daneil Dolin, MD;  [facility];  Service: Endoscopy;  Laterality: N/A;  . KNEE ARTHROSCOPY WITH MEDIAL MENISECTOMY Left 12/09/2014   Procedure: KNEE ARTHROSCOPY WITH PARTIAL MEDIAL AND LATERAL MENISECTOMY;  Surgeon: Sanjuana Kava, MD;  Location: [facility];  Service: Orthopedics;  Laterality: Left;  . TUBAL LIGATION    . WISDOM TOOTH EXTRACTION      There were no vitals filed for this visit.  Subjective Assessment - 07/22/18 1521    Subjective  Reports  some pain.    Pertinent History  HTN, Suttering; left knee surgery, history of syncope    Limitations  Reading;Lifting;House hold activities    Diagnostic tests  X-ray, CT Scan See imaging tab    Patient Stated Goals  get out of pain, be able to watch her grandson    Currently in Pain?  Yes    Pain Score  5     Pain Location  Arm    Pain Orientation  Left    Pain Descriptors / Indicators  Tingling    Pain Type  Acute pain    Pain Onset  1 to 4 weeks ago    Pain Frequency  Constant         [facility] PT Assessment - 07/22/18 0001      Assessment   Medical Diagnosis  Cervical radiculopathy    Referring Provider (PT)  Evelina Dun, FNP    Onset Date/Surgical Date  06/14/18    Hand Dominance  Right    Next MD Visit  MRI 07/29/2018    Prior Therapy  not for cervical spine      Precautions   Precautions  None                   [facility] Adult PT Treatment/Exercise - 07/22/18 0001      Modalities   Modalities  Electrical Stimulation;Moist Heat;Traction  Moist Heat Therapy   Number Minutes Moist Heat  15 Minutes    Moist Heat Location  Cervical      Electrical Stimulation   Electrical Stimulation Location  L UT    Electrical Stimulation Action  Pre-Mod    Electrical Stimulation Parameters  80-150 hz x15 min    Electrical Stimulation Goals  Pain;Tone      Traction   Type of Traction  Cervical    Min (lbs)  5    Max (lbs)  15    Hold Time  99    Rest Time  5    Time  15      Manual Therapy   Manual Therapy  Myofascial release    Myofascial Release  MFR/STW to L UT, cervical paraspinals to reduce muscle tension and pain                  PT Long Term Goals - 07/22/18 1601      PT LONG TERM GOAL #1   Title  Patient will be independent with HEP and its progression.    Time  6    Period  Weeks    Status  On-going      PT LONG TERM GOAL #2   Title  Patient will demonstrate cervical rotation to 55+ degrees bilaterally to scan environment.      Time  6    Period  Weeks    Status  On-going      PT LONG TERM GOAL #3   Title  Patient will report ability to perform ADLs with cervical pain less than 4/10.     Time  6    Period  Weeks    Status  On-going      PT LONG TERM GOAL #4   Title  Patient will report centralization of neurological symptoms to cervical spine to reduce nerve irritation.     Time  6    Period  Weeks    Status  On-going      PT LONG TERM GOAL #5   Title  Patient will report ability to sleep through the night with cervical pain less than 4/10.     Time  6    Period  Weeks    Status  On-going            Plan - 07/22/18 1602    Clinical Impression Statement  Patient presented in clinic with continued tingling down LUE. As of recently she reports L cervical pain and UT pain has been more tolerable. Minimal-moderate muscle tone still palpable in L UT region. Mechanical cervical traction initated today at 15# max with patient educated regarding rationale for traction. Normal modalities response noted following removal of the modalities.    Rehab Potential  Good    PT Frequency  2x / week    PT Duration  6 weeks    PT Treatment/Interventions  ADLs/Self Care Home Management;Cryotherapy;Electrical Stimulation;Ultrasound;Therapeutic activities;Therapeutic exercise;Neuromuscular re-education;Patient/family education;Passive range of motion;Manual techniques;Traction;Moist Heat;Dry needling    PT Next Visit Plan  Continue as symptoms dictate. Attempt mechanical cervical traction.    PT Home Exercise Plan  see patient education section    Consulted and Agree with Plan of Care  Patient       Patient will benefit from skilled therapeutic intervention in order to improve the following deficits and impairments:  Decreased activity tolerance, Pain, Decreased range of motion, Decreased strength, Impaired UE functional use, Decreased endurance, Impaired tone, Postural dysfunction  Visit  Diagnosis: Cervicalgia  Radiculopathy, cervical region  Abnormal posture     Problem List Patient Active Problem List   Diagnosis Date Noted  . Osteoarthritis 11/18/2017  . Insomnia 11/18/2017  . 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 Tear of lateral meniscus of right knee, current 10/28/2017  . 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 Language difficulty 09/25/2016  . Nausea with vomiting 07/18/2016  . PUD (peptic ulcer disease) 07/18/2016  . Slurred speech   . GI bleed 05/02/2016  . Upper GI bleed 05/02/2016  . Sinus tachycardia 05/02/2016  . Acute blood loss anemia 05/02/2016  . Abdominal pain, epigastric 05/02/2016  . 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 Thyroid nodule 12/14/2014  . Left knee pain 12/14/2014  . Seizures ([place])   . Altered mental status 12/12/2014  . Vitamin D deficiency 06/30/2014  . GERD (gastroesophageal reflux disease) 06/29/2014  . SYNCOPE 03/02/2010  . HYPERTENSION, BENIGN 03/01/2010    Standley Brooking, PTA 07/22/2018, 4:15 PM  [facility] [place], [place], [address] Phone: [phone]   Fax:  [phone]  Name: Brittany Durham MRN: [id number] Date of Birth: 12/15/1970

## 2018-07-24 ENCOUNTER — Encounter: Payer: Medicare Other | Admitting: Physical Therapy

## 2018-07-28 ENCOUNTER — Ambulatory Visit: Payer: Medicare Other | Admitting: Physical Therapy

## 2018-07-28 ENCOUNTER — Other Ambulatory Visit: Payer: Self-pay

## 2018-07-28 ENCOUNTER — Encounter: Payer: Self-pay | Admitting: Physical Therapy

## 2018-07-28 DIAGNOSIS — M5412 Radiculopathy, cervical region: Secondary | ICD-10-CM | POA: Diagnosis not present

## 2018-07-28 DIAGNOSIS — M542 Cervicalgia: Secondary | ICD-10-CM | POA: Diagnosis not present

## 2018-07-28 DIAGNOSIS — R293 Abnormal posture: Secondary | ICD-10-CM | POA: Diagnosis not present

## 2018-07-28 NOTE — Therapy (Addendum)
[facility] [place], [place], [address] Phone: [phone]   Fax:  [phone]  Physical Therapy Treatment  Patient Details  Name: Brittany Durham MRN: [id number] Date of Birth: 1970/08/04 Referring Provider (PT): Evelina Dun, FNP   Encounter Date: 07/28/2018  PT End of Session - 07/28/18 1447    Visit Number  8    Number of Visits  12    Date for PT Re-Evaluation  09/23/18    PT Start Time  6948   late arrival   PT Stop Time  1532    PT Time Calculation (min)  47 min    Activity Tolerance  Patient tolerated treatment well    Behavior During Therapy  [facility] for tasks assessed/performed       Past Medical History:  Diagnosis Date  . Carotidynia   . DDD (degenerative disc disease), cervical   . Hypertension   . Knee pain    left meniscus tear s/p repair 11/2014  . Seizures ([place])   . Stuttering    patient reports this started after her knee surgery in 11/2014.   Marland Kitchen Thyroid nodule     Past Surgical History:  Procedure Laterality Date  . COLONOSCOPY N/A 08/23/2016   Procedure: COLONOSCOPY;  Surgeon: Daneil Dolin, MD;  [facility];  Service: Endoscopy;  Laterality: N/A;  1:00pm  . ESOPHAGOGASTRODUODENOSCOPY N/A 05/03/2016   Procedure: ESOPHAGOGASTRODUODENOSCOPY (EGD);  Surgeon: Daneil Dolin, MD;  [facility];  Service: Endoscopy;  Laterality: N/A;  . ESOPHAGOGASTRODUODENOSCOPY N/A 08/23/2016   Procedure: ESOPHAGOGASTRODUODENOSCOPY (EGD);  Surgeon: Daneil Dolin, MD;  [facility];  Service: Endoscopy;  Laterality: N/A;  . KNEE ARTHROSCOPY WITH MEDIAL MENISECTOMY Left 12/09/2014   Procedure: KNEE ARTHROSCOPY WITH PARTIAL MEDIAL AND LATERAL MENISECTOMY;  Surgeon: Sanjuana Kava, MD;  Location: [facility];  Service: Orthopedics;  Laterality: Left;  . TUBAL LIGATION    . WISDOM TOOTH EXTRACTION      There were no vitals filed for this visit.  Subjective Assessment - 07/28/18 1510    Subjective  Patient to have her MRI tomorrow     Pertinent History  HTN, Stuttering; left knee surgery, history of syncope    Limitations  Reading;Lifting;House hold activities    Diagnostic tests  X-ray, CT Scan See imaging tab    Patient Stated Goals  get out of pain, be able to watch her grandson    Currently in Pain?  Yes    Pain Score  5     Pain Location  Arm    Pain Orientation  Left    Pain Descriptors / Indicators  Tingling    Pain Type  Acute pain    Pain Onset  1 to 4 weeks ago    Pain Frequency  Constant         [facility] PT Assessment - 07/28/18 0001      Assessment   Medical Diagnosis  Cervical radiculopathy    Referring Provider (PT)  Evelina Dun, FNP    Onset Date/Surgical Date  06/14/18    Hand Dominance  Right    Next MD Visit  MRI 07/29/2018    Prior Therapy  not for cervical spine      Precautions   Precautions  None                   [facility] Adult PT Treatment/Exercise - 07/28/18 0001      Modalities   Modalities  Electrical  Stimulation;Moist Heat;Traction      Moist Heat Therapy   Number Minutes Moist Heat  15 Minutes    Moist Heat Location  Cervical      Electrical Stimulation   Electrical Stimulation Location  L UT    Electrical Stimulation Action  Pre-Mod    Electrical Stimulation Parameters  80-150 hz mins    Electrical Stimulation Goals  Pain;Tone      Traction   Type of Traction  Cervical    Min (lbs)  5    Max (lbs)  15    Hold Time  99    Rest Time  5    Time  15      Manual Therapy   Manual Therapy  Myofascial release    Myofascial Release  MFR/STW to L UT, cervical paraspinals to reduce muscle tension and pain                  PT Long Term Goals - 07/22/18 1601      PT LONG TERM GOAL #1   Title  Patient will be independent with HEP and its progression.    Time  6    Period  Weeks    Status  On-going      PT LONG TERM GOAL #2   Title  Patient will demonstrate cervical rotation to 55+ degrees  bilaterally to scan environment.     Time  6    Period  Weeks    Status  On-going      PT LONG TERM GOAL #3   Title  Patient will report ability to perform ADLs with cervical pain less than 4/10.     Time  6    Period  Weeks    Status  On-going      PT LONG TERM GOAL #4   Title  Patient will report centralization of neurological symptoms to cervical spine to reduce nerve irritation.     Time  6    Period  Weeks    Status  On-going      PT LONG TERM GOAL #5   Title  Patient will report ability to sleep through the night with cervical pain less than 4/10.     Time  6    Period  Weeks    Status  On-going            Plan - 07/28/18 1511    Clinical Impression Statement  Patient was able to tolerate treatment well but continues to have symptoms down left UE. Patient denied pain with STW/M. Traction maintained at 15# with no reports of increased pain. No adverse affects noted upon removal.     Stability/Clinical Decision Making  Stable/Uncomplicated    Clinical Decision Making  Low    Rehab Potential  Good    PT Frequency  2x / week    PT Duration  6 weeks    PT Treatment/Interventions  ADLs/Self Care Home Management;Cryotherapy;Electrical Stimulation;Ultrasound;Therapeutic activities;Therapeutic exercise;Neuromuscular re-education;Patient/family education;Passive range of motion;Manual techniques;Traction;Moist Heat;Dry needling    PT Next Visit Plan  Continue as symptoms dictate. Attempt mechanical cervical traction.    Consulted and Agree with Plan of Care  Patient       Patient will benefit from skilled therapeutic intervention in order to improve the following deficits and impairments:  Decreased activity tolerance, Pain, Decreased range of motion, Decreased strength, Impaired UE functional use, Decreased endurance, Impaired tone, Postural dysfunction  Visit Diagnosis: Cervicalgia  Radiculopathy, cervical region  Abnormal  posture     Problem List Patient Active  Problem List   Diagnosis Date Noted  . Osteoarthritis 11/18/2017  . Insomnia 11/18/2017  . 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 Tear of lateral meniscus of right knee, current 10/28/2017  . 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 Language difficulty 09/25/2016  . Nausea with vomiting 07/18/2016  . PUD (peptic ulcer disease) 07/18/2016  . Slurred speech   . GI bleed 05/02/2016  . Upper GI bleed 05/02/2016  . Sinus tachycardia 05/02/2016  . Acute blood loss anemia 05/02/2016  . Abdominal pain, epigastric 05/02/2016  . 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 Thyroid nodule 12/14/2014  . Left knee pain 12/14/2014  . Seizures ([place])   . Altered mental status 12/12/2014  . Vitamin D deficiency 06/30/2014  . GERD (gastroesophageal reflux disease) 06/29/2014  . SYNCOPE 03/02/2010  . HYPERTENSION, BENIGN 03/01/2010    Gabriela Eves, PT, DPT 07/28/2018, 8:34 PM  [facility] [place], [place], [address] Phone: [phone]   Fax:  [phone]  Name: Brittany Durham MRN: [id number] Date of Birth: Mar 15, 1971

## 2018-07-29 DIAGNOSIS — M5412 Radiculopathy, cervical region: Secondary | ICD-10-CM | POA: Diagnosis not present

## 2018-07-31 ENCOUNTER — Other Ambulatory Visit: Payer: Self-pay | Admitting: Family

## 2018-07-31 ENCOUNTER — Ambulatory Visit: Payer: Medicare Other | Admitting: Physical Therapy

## 2018-07-31 ENCOUNTER — Other Ambulatory Visit: Payer: Self-pay

## 2018-07-31 DIAGNOSIS — M542 Cervicalgia: Secondary | ICD-10-CM | POA: Diagnosis not present

## 2018-07-31 DIAGNOSIS — M5412 Radiculopathy, cervical region: Secondary | ICD-10-CM

## 2018-07-31 DIAGNOSIS — R293 Abnormal posture: Secondary | ICD-10-CM | POA: Diagnosis not present

## 2018-07-31 NOTE — Therapy (Addendum)
[facility] [place], [place], [address] Phone: [phone]   Fax:  [phone]  Physical Therapy Treatment PHYSICAL THERAPY DISCHARGE SUMMARY  Visits from Start of Care: 9  Current functional level related to goals / functional outcomes: See below   Remaining deficits: See goals   Education / Equipment: HEP Plan: Patient agrees to discharge.  Patient goals were not met. Patient is being discharged due to not returning since the last visit.  ?????  Gabriela Eves, PT, DPT 04/13/19   Patient Details  Name: Brittany Durham MRN: [id number] Date of Birth: Jul 20, 1970 Referring Provider (PT): Evelina Dun, FNP   Encounter Date: 07/31/2018  PT End of Session - 07/31/18 1524    Visit Number  9    Number of Visits  12    Date for PT Re-Evaluation  09/23/18    PT Start Time  0962    PT Stop Time  1608    PT Time Calculation (min)  44 min    Activity Tolerance  Patient tolerated treatment well    Behavior During Therapy  [facility] for tasks assessed/performed       Past Medical History:  Diagnosis Date  . Carotidynia   . DDD (degenerative disc disease), cervical   . Hypertension   . Knee pain    left meniscus tear s/p repair 11/2014  . Seizures ([place])   . Stuttering    patient reports this started after her knee surgery in 11/2014.   Marland Kitchen Thyroid nodule     Past Surgical History:  Procedure Laterality Date  . COLONOSCOPY N/A 08/23/2016   Procedure: COLONOSCOPY;  Surgeon: Daneil Dolin, MD;  [facility];  Service: Endoscopy;  Laterality: N/A;  1:00pm  . ESOPHAGOGASTRODUODENOSCOPY N/A 05/03/2016   Procedure: ESOPHAGOGASTRODUODENOSCOPY (EGD);  Surgeon: Daneil Dolin, MD;  [facility];  Service: Endoscopy;  Laterality: N/A;  . ESOPHAGOGASTRODUODENOSCOPY N/A 08/23/2016   Procedure: ESOPHAGOGASTRODUODENOSCOPY (EGD);  Surgeon: Daneil Dolin, MD;  [facility];  Service: Endoscopy;  Laterality:  N/A;  . KNEE ARTHROSCOPY WITH MEDIAL MENISECTOMY Left 12/09/2014   Procedure: KNEE ARTHROSCOPY WITH PARTIAL MEDIAL AND LATERAL MENISECTOMY;  Surgeon: Sanjuana Kava, MD;  Location: [facility];  Service: Orthopedics;  Laterality: Left;  . TUBAL LIGATION    . WISDOM TOOTH EXTRACTION      There were no vitals filed for this visit.  Subjective Assessment - 07/31/18 1523    Subjective  Reports that she a steroid that was started yesterday instead of getting the MRI. Not having any pain today.    Pertinent History  HTN, Stuttering; left knee surgery, history of syncope    Limitations  Reading;Lifting;House hold activities    Diagnostic tests  X-ray, CT Scan See imaging tab    Patient Stated Goals  get out of pain, be able to watch her grandson    Currently in Pain?  No/denies         [facility] PT Assessment - 07/31/18 0001      Assessment   Medical Diagnosis  Cervical radiculopathy    Referring Provider (PT)  Evelina Dun, FNP    Onset Date/Surgical Date  06/14/18    Hand Dominance  Right    Next MD Visit  MRI 07/29/2018    Prior Therapy  not for cervical spine      Precautions   Precautions  None  [facility] Adult PT Treatment/Exercise - 07/31/18 0001      Exercises   Exercises  Neck      Neck Exercises: Machines for Strengthening   UBE (Upper Arm Bike)  120 RPM x6 min      Neck Exercises: Standing   Other Standing Exercises  Shoulder rows, ext pink XTS x20 reps      Modalities   Modalities  Traction      Traction   Type of Traction  Cervical    Min (lbs)  5    Max (lbs)  15    Hold Time  99    Rest Time  5    Time  15      Manual Therapy   Manual Therapy  Myofascial release    Myofascial Release  MFR/STW to L UT, cervical paraspinals to reduce muscle tension and pain                  PT Long Term Goals - 07/22/18 1601      PT LONG TERM GOAL #1   Title  Patient will be independent with HEP and its progression.    Time  6    Period   Weeks    Status  On-going      PT LONG TERM GOAL #2   Title  Patient will demonstrate cervical rotation to 55+ degrees bilaterally to scan environment.     Time  6    Period  Weeks    Status  On-going      PT LONG TERM GOAL #3   Title  Patient will report ability to perform ADLs with cervical pain less than 4/10.     Time  6    Period  Weeks    Status  On-going      PT LONG TERM GOAL #4   Title  Patient will report centralization of neurological symptoms to cervical spine to reduce nerve irritation.     Time  6    Period  Weeks    Status  On-going      PT LONG TERM GOAL #5   Title  Patient will report ability to sleep through the night with cervical pain less than 4/10.     Time  6    Period  Weeks    Status  On-going            Plan - 07/31/18 1615    Clinical Impression Statement  Patient presented in clinic with no complaints of cervical pain today. Patient initiated with therex with postural focus and VCs to improve posture in standing. Continued moderate muscle tightness palpable in L UT but no complaints from patient during manual therapy. Traction continued at 15# max with normal response.    Stability/Clinical Decision Making  Stable/Uncomplicated    Rehab Potential  Good    PT Frequency  2x / week    PT Duration  6 weeks    PT Treatment/Interventions  ADLs/Self Care Home Management;Cryotherapy;Electrical Stimulation;Ultrasound;Therapeutic activities;Therapeutic exercise;Neuromuscular re-education;Patient/family education;Passive range of motion;Manual techniques;Traction;Moist Heat;Dry needling    PT Next Visit Plan  Continue as symptoms dictate. Attempt mechanical cervical traction.    PT Home Exercise Plan  see patient education section    Consulted and Agree with Plan of Care  Patient       Patient will benefit from skilled therapeutic intervention in order to improve the following deficits and impairments:  Decreased activity tolerance, Pain, Decreased  range of motion, Decreased strength,  Impaired UE functional use, Decreased endurance, Impaired tone, Postural dysfunction  Visit Diagnosis: Cervicalgia  Radiculopathy, cervical region  Abnormal posture     Problem List Patient Active Problem List   Diagnosis Date Noted  . Osteoarthritis 11/18/2017  . Insomnia 11/18/2017  . 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 Tear of lateral meniscus of right knee, current 10/28/2017  . 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 Language difficulty 09/25/2016  . Nausea with vomiting 07/18/2016  . PUD (peptic ulcer disease) 07/18/2016  . Slurred speech   . GI bleed 05/02/2016  . Upper GI bleed 05/02/2016  . Sinus tachycardia 05/02/2016  . Acute blood loss anemia 05/02/2016  . Abdominal pain, epigastric 05/02/2016  . 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 Thyroid nodule 12/14/2014  . Left knee pain 12/14/2014  . Seizures ([place])   . Altered mental status 12/12/2014  . Vitamin D deficiency 06/30/2014  . GERD (gastroesophageal reflux disease) 06/29/2014  . SYNCOPE 03/02/2010  . HYPERTENSION, BENIGN 03/01/2010    Standley Brooking, PTA 07/31/2018, 4:29 PM  [facility] [place], [place], [address] Phone: [phone]   Fax:  [phone]  Name: Brittany Durham MRN: [id number] Date of Birth: [birth year]

## 2018-08-05 ENCOUNTER — Other Ambulatory Visit: Payer: Self-pay | Admitting: Gastroenterology

## 2018-08-05 ENCOUNTER — Other Ambulatory Visit: Payer: Self-pay | Admitting: Family

## 2018-08-05 ENCOUNTER — Encounter: Payer: Medicare Other | Admitting: Physical Therapy

## 2018-08-05 DIAGNOSIS — M79601 Pain in right arm: Secondary | ICD-10-CM

## 2018-08-05 DIAGNOSIS — K279 Peptic ulcer, site unspecified, unspecified as acute or chronic, without hemorrhage or perforation: Secondary | ICD-10-CM

## 2018-08-05 NOTE — Telephone Encounter (Signed)
Please advise 

## 2018-08-07 ENCOUNTER — Encounter: Payer: Medicare Other | Admitting: Physical Therapy

## 2018-08-13 ENCOUNTER — Telehealth: Payer: Self-pay | Admitting: Physical Therapy

## 2018-08-13 NOTE — Telephone Encounter (Signed)
  Brittany Durham [facility] emergency contact, [name] was contacted today regarding the temporary reduction of OP Rehab Services due to concerns for community transmission of Covid-19.  Left voicemail for patient/emergency contact to give our office a call back if she had any questions or concerns.

## 2018-08-19 ENCOUNTER — Ambulatory Visit: Payer: Medicare Other | Admitting: Physical Therapy

## 2018-09-02 ENCOUNTER — Other Ambulatory Visit: Payer: Self-pay | Admitting: Neurological Surgery

## 2018-09-02 DIAGNOSIS — M5412 Radiculopathy, cervical region: Secondary | ICD-10-CM

## 2018-09-09 ENCOUNTER — Other Ambulatory Visit: Payer: Self-pay | Admitting: Family

## 2018-09-17 ENCOUNTER — Encounter: Payer: Self-pay | Admitting: Family

## 2018-09-17 ENCOUNTER — Ambulatory Visit (INDEPENDENT_AMBULATORY_CARE_PROVIDER_SITE_OTHER): Payer: Medicare Other | Admitting: Family

## 2018-09-17 ENCOUNTER — Other Ambulatory Visit: Payer: Self-pay

## 2018-09-17 DIAGNOSIS — M1711 Unilateral primary osteoarthritis, right knee: Secondary | ICD-10-CM | POA: Diagnosis not present

## 2018-09-17 DIAGNOSIS — K219 Gastro-esophageal reflux disease without esophagitis: Secondary | ICD-10-CM

## 2018-09-17 DIAGNOSIS — I10 Essential (primary) hypertension: Secondary | ICD-10-CM | POA: Diagnosis not present

## 2018-09-17 DIAGNOSIS — R479 Unspecified speech disturbances: Secondary | ICD-10-CM

## 2018-09-17 DIAGNOSIS — G4700 Insomnia, unspecified: Secondary | ICD-10-CM

## 2018-09-17 DIAGNOSIS — K279 Peptic ulcer, site unspecified, unspecified as acute or chronic, without hemorrhage or perforation: Secondary | ICD-10-CM | POA: Diagnosis not present

## 2018-09-17 DIAGNOSIS — F809 Developmental disorder of speech and language, unspecified: Secondary | ICD-10-CM

## 2018-09-17 DIAGNOSIS — E559 Vitamin D deficiency, unspecified: Secondary | ICD-10-CM

## 2018-09-17 MED ORDER — DIAZEPAM 5 MG PO TABS: 5.0000 mg | ORAL_TABLET | Freq: Once | ORAL | 0 refills | Status: AC

## 2018-09-17 MED ORDER — PANTOPRAZOLE SODIUM 40 MG PO TBEC: 40.0000 mg | DELAYED_RELEASE_TABLET | Freq: Every day | ORAL | 11 refills | Status: DC

## 2018-09-17 MED ORDER — VITAMIN D (ERGOCALCIFEROL) 1.25 MG (50000 UNIT) PO CAPS: ORAL_CAPSULE | ORAL | 3 refills | Status: DC

## 2018-09-17 MED ORDER — AMLODIPINE BESYLATE 10 MG PO TABS: 10.0000 mg | ORAL_TABLET | Freq: Every day | ORAL | 1 refills | Status: DC

## 2018-09-17 NOTE — Progress Notes (Signed)
Virtual Visit via telephone Note  I connected with Brittany Durham and daughter on 09/17/18 at 2:45 pm by telephone and verified that I am speaking with the correct person using two identifiers. Brittany Durham is currently located at home and daughter is currently with her during visit. The provider, Evelina Dun, FNP is located in their office at time of visit.  I discussed the limitations, risks, security and privacy concerns of performing an evaluation and management service by telephone and the availability of in person appointments. I also discussed with the patient that there may be a patient responsible charge related to this service. The patient expressed understanding and agreed to proceed.   History and Present Illness:  PT presents to the office today for chronic follow up. Pt is followed by Neurologists for Speech difficulties. She states she was scheduled for a MRI but could not get it completed because of the tight space.  Hypertension  This is a chronic problem. The current episode started more than 1 year ago. The problem has been waxing and waning since onset. Associated symptoms include malaise/fatigue. Pertinent negatives include no headaches, peripheral edema or shortness of breath. Risk factors for coronary artery disease include dyslipidemia and sedentary lifestyle. The current treatment provides moderate improvement. There is no history of CAD/MI, CVA or heart failure.  Gastroesophageal Reflux  She complains of heartburn. She reports no belching or no coughing. This is a chronic problem. The current episode started more than 1 year ago. The problem occurs occasionally. The problem has been waxing and waning. She has tried a PPI for the symptoms. The treatment provided moderate relief.  Arthritis  Presents for follow-up visit. She complains of pain and stiffness. The symptoms have been stable. Affected locations include the right knee and left knee (back). Her pain is at a  severity of 8/10.  Insomnia  Primary symptoms: difficulty falling asleep, frequent awakening, malaise/fatigue.  The current episode started more than one year. The onset quality is gradual. The problem occurs intermittently. The problem has been waxing and waning since onset.      Review of Systems  Constitutional: Positive for malaise/fatigue.  Respiratory: Negative for cough and shortness of breath.   Gastrointestinal: Positive for heartburn.  Musculoskeletal: Positive for arthritis and stiffness.  Neurological: Negative for headaches.  Psychiatric/Behavioral: The patient has insomnia.      Observations/Objective: No SOB or distress noticed. Stuttering.  Assessment and Plan: Brittany Durham comes in today with chief complaint of No chief complaint on file.   Diagnosis and orders addressed:  1. Gastroesophageal reflux disease without esophagitis  2. HYPERTENSION, BENIGN - amLODipine (NORVASC) 10 MG tablet; Take 1 tablet (10 mg total) by mouth daily.  Dispense: 90 tablet; Refill: 1  3. Insomnia, unspecified type  4. Language difficulty  5. Primary osteoarthritis of right knee  6. Speech disorder  7. PUD (peptic ulcer disease) - pantoprazole (PROTONIX) 40 MG tablet; Take 1 tablet (40 mg total) by mouth daily before breakfast.  Dispense: 30 tablet; Refill: 11  8. Vitamin D deficiency - Vitamin D, Ergocalciferol, (DRISDOL) 1.25 MG (50000 UT) CAPS capsule; TAKE 1 CAPSULE ONCE A WEEK  Dispense: 12 capsule; Refill: 3   Labs reviewed from [facility] 02/20 Keep follow up for Neurologists, will give rx of Valium to take 20-30 mins before MRI Health Maintenance reviewed Diet and exercise encouraged  Follow up plan: 3 months        I discussed the assessment and treatment plan with the patient. The  patient was provided an opportunity to ask questions and all were answered. The patient agreed with the plan and demonstrated an understanding of the instructions.   The  patient was advised to call back or seek an in-person evaluation if the symptoms worsen or if the condition fails to improve as anticipated.  The above assessment and management plan was discussed with the patient. The patient verbalized understanding of and has agreed to the management plan. Patient is aware to call the clinic if symptoms persist or worsen. Patient is aware when to return to the clinic for a follow-up visit. Patient educated on when it is appropriate to go to the emergency department.   Time call ended:  3:11 pm  I provided 26 minutes of non-face-to-face time during this encounter.    Evelina Dun, FNP

## 2018-09-30 DIAGNOSIS — M5412 Radiculopathy, cervical region: Secondary | ICD-10-CM | POA: Diagnosis not present

## 2018-09-30 DIAGNOSIS — M47812 Spondylosis without myelopathy or radiculopathy, cervical region: Secondary | ICD-10-CM | POA: Diagnosis not present

## 2018-10-01 ENCOUNTER — Encounter (INDEPENDENT_AMBULATORY_CARE_PROVIDER_SITE_OTHER): Payer: Self-pay

## 2018-10-01 ENCOUNTER — Telehealth: Payer: Self-pay | Admitting: Family

## 2018-10-07 ENCOUNTER — Telehealth: Payer: Self-pay | Admitting: Physical Therapy

## 2018-10-07 NOTE — Telephone Encounter (Signed)
Brittany Durham was contacted in regards to resuming therapy. Left voicemail for patient to give our office a call back.

## 2018-10-29 ENCOUNTER — Other Ambulatory Visit: Payer: Self-pay

## 2018-10-29 ENCOUNTER — Ambulatory Visit (INDEPENDENT_AMBULATORY_CARE_PROVIDER_SITE_OTHER): Payer: Medicare Other

## 2018-10-29 DIAGNOSIS — Z0000 Encounter for general adult medical examination without abnormal findings: Secondary | ICD-10-CM | POA: Diagnosis not present

## 2018-10-29 NOTE — Progress Notes (Signed)
MEDICARE ANNUAL WELLNESS VISIT  10/29/2018  Telephone Visit Disclaimer This Medicare AWV was conducted by telephone due to national recommendations for restrictions regarding the COVID-19 Pandemic (e.g. social distancing).  I verified, using two identifiers, that I am speaking with Brittany Durham or their authorized healthcare agent. I discussed the limitations, risks, security, and privacy concerns of performing an evaluation and management service by telephone and the potential availability of an in-person appointment in the future. The patient expressed understanding and agreed to proceed.   Subjective:  Brittany Durham is a 48 y.o. female patient of Brittany Durham, Brittany Hawthorne, FNP who had a Medicare Annual Wellness Visit today via telephone. Brittany Durham is Unemployed and lives alone. she has 1 children. she reports that she is socially active and does interact with friends/family regularly. she is moderately physically active. Her daughter visits and speaks with her daily. Her daughter assisted in the phone interview because patient stutters and is at times hard to understand. .  Patient Care Team: Sharion Balloon, FNP as PCP - General (Nurse Practitioner)  Advanced Directives 10/29/2018 07/06/2018 06/25/2018 06/14/2018 02/19/2018 01/28/2018 12/03/2017  Does Patient Have a Medical Advance Directive? No No No No No No No  Would patient like information on creating a medical advance directive? No - Patient declined No - Patient declined - No - Patient declined - - No - Patient declined    Hospital Utilization Over the Past 12 Months: # of hospitalizations or ER visits: 1 # of surgeries: 0  Review of Systems    Patient reports that her overall health is unchanged compared to last year.  Patient Reported Readings (BP, Pulse, CBG, Weight, etc) none  Review of Systems: History obtained from chart review  All other systems negative.  Pain Assessment Pain : 0-10 Pain Score: 5  Pain Type: Chronic pain  Pain Location: Arm Pain Orientation: Right Pain Descriptors / Indicators: Constant Pain Onset: More than a month ago Pain Frequency: Constant Pain Relieving Factors: Pain meds  Pain Relieving Factors: Pain meds  Current Medications & Allergies (verified) Allergies as of 10/29/2018   No Known Allergies     Medication List       Accurate as of October 29, 2018  3:42 PM. If you have any questions, ask your nurse or doctor.        amLODipine 10 MG tablet Commonly known as: NORVASC Take 1 tablet (10 mg total) by mouth daily.   cyclobenzaprine 5 MG tablet Commonly known as: FLEXERIL Take 1 tablet (5 mg total) by mouth 3 (three) times daily as needed.   gabapentin 300 MG capsule Commonly known as: NEURONTIN Take 1 capsule (300 mg total) by mouth 3 (three) times daily.   lisinopril 20 MG tablet Commonly known as: ZESTRIL TAKE 1 TABLET DAILY   pantoprazole 40 MG tablet Commonly known as: PROTONIX Take 1 tablet (40 mg total) by mouth daily before breakfast.   TYLENOL PO Take by mouth.   Vitamin D (Ergocalciferol) 1.25 MG (50000 UT) Caps capsule Commonly known as: DRISDOL TAKE 1 CAPSULE ONCE A WEEK       History (reviewed): Past Medical History:  Diagnosis Date  . Carotidynia   . DDD (degenerative disc disease), cervical   . Hypertension   . Knee pain    left meniscus tear s/p repair 11/2014  . Seizures ([place])   . Stuttering    patient reports this started after her knee surgery in 11/2014.   Marland Kitchen Thyroid nodule  Past Surgical History:  Procedure Laterality Date  . COLONOSCOPY N/A 08/23/2016   Procedure: COLONOSCOPY;  Surgeon: Daneil Dolin, MD;  [facility];  Service: Endoscopy;  Laterality: N/A;  1:00pm  . ESOPHAGOGASTRODUODENOSCOPY N/A 05/03/2016   Procedure: ESOPHAGOGASTRODUODENOSCOPY (EGD);  Surgeon: Daneil Dolin, MD;  [facility]: [facility];  Service: Endoscopy;  Laterality: N/A;  . ESOPHAGOGASTRODUODENOSCOPY N/A 08/23/2016   Procedure:  ESOPHAGOGASTRODUODENOSCOPY (EGD);  Surgeon: Daneil Dolin, MD;  [facility];  Service: Endoscopy;  Laterality: N/A;  . KNEE ARTHROSCOPY WITH MEDIAL MENISECTOMY Left 12/09/2014   Procedure: KNEE ARTHROSCOPY WITH PARTIAL MEDIAL AND LATERAL MENISECTOMY;  Surgeon: Sanjuana Kava, MD;  Location: [facility];  Service: Orthopedics;  Laterality: Left;  . TUBAL LIGATION    . WISDOM TOOTH EXTRACTION     Family History  Problem Relation Age of Onset  . Hypertension Mother   . Hypertension Brother   . Colon cancer Neg Hx    Social History   Socioeconomic History  . Marital status: Single    Spouse name: Not on file  . Number of children: 1  . Years of education: 27  . Highest education level: Not on file  Occupational History  . Occupation: Umemployed  Social Needs  . Financial resource strain: Not hard at all  . Food insecurity    Worry: Never true    Inability: Never true  . Transportation needs    Medical: No    Non-medical: No  Tobacco Use  . Smoking status: Never Smoker  . Smokeless tobacco: Never Used  Substance and Sexual Activity  . Alcohol use: No  . Drug use: No  . Sexual activity: Not Currently    Birth control/protection: Surgical  Lifestyle  . Physical activity    Days per week: 0 days    Minutes per session: 0 min  . Stress: Not at all  Relationships  . Social connections    Talks on phone: More than three times a week    Gets together: More than three times a week    Attends religious service: Not on file    Active member of club or organization: No    Attends meetings of clubs or organizations: Never    Relationship status: Not on file  Other Topics Concern  . Not on file  Social History Narrative   Lives at home alone.   Right-handed.   Occasional caffeine use.    Activities of Daily Living In your present state of health, do you have any difficulty performing the following activities: 10/29/2018  Hearing? N  Vision? Y  Comment Wears glasses   Difficulty concentrating or making decisions? N  Walking or climbing stairs? N  Dressing or bathing? N  Doing errands, shopping? N  Preparing Food and eating ? N  Using the Toilet? N  In the past six months, have you accidently leaked urine? N  Do you have problems with loss of bowel control? N  Managing your Medications? N  Managing your Finances? N  Housekeeping or managing your Housekeeping? N  Some recent data might be hidden    Patient Literacy How often do you need to have someone help you when you read instructions, pamphlets, or other written materials from your doctor or pharmacy?: 1 - Never What is the last grade level you completed in school?: High school graduate  Exercise Current Exercise Habits: The patient does not participate in regular exercise at present, Exercise limited by: None identified  Diet Patient reports consuming 3 meals a day and 2 snack(s) a day Patient reports that her primary diet is: Regular Patient reports that she does have regular access to food.   Depression Screen PHQ 2/9 Scores 06/19/2018 02/25/2018 11/18/2017 07/29/2017 07/20/2017 05/20/2017 04/30/2017  PHQ - 2 Score 0 0 0 1 3 0 0  PHQ- 9 Score - - - - 3 - -     Fall Risk Fall Risk  11/18/2017 07/29/2017 07/20/2017 04/30/2017 10/29/2016  Falls in the past year? Yes Yes Yes Yes No  Number falls in past yr: 1 1 1 1  -  Comment - - - Fell on ice and snow. -  Injury with Fall? No - No - -  Comment - - - - -     Objective:  Brittany Durham seemed alert and oriented and she participated appropriately during our telephone visit.  Blood Pressure Weight BMI  BP Readings from Last 3 Encounters:  07/07/18 (!) 142/89  06/19/18 136/89  06/14/18 135/86   Wt Readings from Last 3 Encounters:  07/06/18 207 lb (93.9 kg)  06/19/18 207 lb (93.9 kg)  06/14/18 210 lb (95.3 kg)   BMI Readings from Last 1 Encounters:  07/06/18 32.42 kg/m    *Unable to obtain current vital signs, weight, and BMI due to telephone  visit type  Hearing/Vision  . [place] did not seem to have difficulty with hearing/understanding during the telephone conversation . Reports that she has had a formal eye exam by an eye care professional within the past year . Reports that she has not had a formal hearing evaluation within the past year *Unable to fully assess hearing and vision during telephone visit type  Cognitive Function: 6CIT Screen 10/29/2018  What Year? 0 points  What month? 0 points  What time? 0 points  Count back from 20 0 points  Months in reverse 0 points  Repeat phrase 0 points  Total Score 0   (Normal:0-7, Significant for Dysfunction: >8)  Normal Cognitive Function Screening: Yes   Immunization & Health Maintenance Record  There is no immunization history on file for this patient.  Health Maintenance  Topic Date Due  . MAMMOGRAM  10/29/2019 (Originally 11/12/1986)  . TETANUS/TDAP  10/29/2019 (Originally 11/12/1987)  . PAP SMEAR-Modifier  04/30/2020  . HIV Screening  Completed  . INFLUENZA VACCINE  Discontinued       Assessment  This is a routine wellness examination for [facility].  Health Maintenance: Due or Overdue There are no preventive care reminders to display for this patient.  Brittany Durham does not need a referral for Community Assistance: Care Management:   no Social Work:    no Prescription Assistance:  no Nutrition/Diabetes Education:  no   Plan:  Personalized Goals Goals Addressed   None    Personalized Health Maintenance & Screening Recommendations  Td vaccine  Lung Cancer Screening Recommended: no (Low Dose CT Chest recommended if Age 26-80 years, 30 pack-year currently smoking OR have quit w/in past 15 years) Hepatitis C Screening recommended: no HIV Screening recommended: no  Completed on 09/21/16  Advanced Directives: Written information was not prepared per patient's request.  Referrals & Orders No orders of the defined types were placed in this  encounter.   Follow-up Plan . Follow-up with Sharion Balloon, FNP as planned    I have personally reviewed and noted the following in the patient's chart:   . Medical and social history . Use of alcohol, tobacco or illicit drugs  .  Current medications and supplements . Functional ability and status . Nutritional status . Physical activity . Advanced directives . List of other physicians . Hospitalizations, surgeries, and ER visits in previous 12 months . Vitals . Screenings to include cognitive, depression, and falls . Referrals and appointments  In addition, I have reviewed and discussed with Brittany Durham certain preventive protocols, quality metrics, and best practice recommendations. A written personalized care plan for preventive services as well as general preventive health recommendations is available and can be mailed to the patient at her request.      Rolena Infante LPN  [id number]

## 2018-11-10 ENCOUNTER — Encounter: Payer: Self-pay | Admitting: Internal Medicine

## 2018-11-13 DIAGNOSIS — M722 Plantar fascial fibromatosis: Secondary | ICD-10-CM | POA: Diagnosis not present

## 2018-11-13 DIAGNOSIS — M79672 Pain in left foot: Secondary | ICD-10-CM | POA: Diagnosis not present

## 2018-11-27 DIAGNOSIS — R030 Elevated blood-pressure reading, without diagnosis of hypertension: Secondary | ICD-10-CM | POA: Diagnosis not present

## 2018-11-27 DIAGNOSIS — Z6833 Body mass index (BMI) 33.0-33.9, adult: Secondary | ICD-10-CM | POA: Diagnosis not present

## 2018-11-27 DIAGNOSIS — M5412 Radiculopathy, cervical region: Secondary | ICD-10-CM | POA: Diagnosis not present

## 2018-12-04 DIAGNOSIS — M79672 Pain in left foot: Secondary | ICD-10-CM | POA: Diagnosis not present

## 2018-12-04 DIAGNOSIS — M722 Plantar fascial fibromatosis: Secondary | ICD-10-CM | POA: Diagnosis not present

## 2018-12-31 ENCOUNTER — Encounter: Payer: Self-pay | Admitting: Nurse Practitioner

## 2018-12-31 ENCOUNTER — Other Ambulatory Visit: Payer: Self-pay

## 2018-12-31 ENCOUNTER — Ambulatory Visit (INDEPENDENT_AMBULATORY_CARE_PROVIDER_SITE_OTHER): Payer: Medicare Other | Admitting: Nurse Practitioner

## 2018-12-31 VITALS — BP 147/86 | HR 96 | Temp 97.0°F | Ht 67.0 in | Wt 217.2 lb

## 2018-12-31 DIAGNOSIS — R079 Chest pain, unspecified: Secondary | ICD-10-CM | POA: Diagnosis not present

## 2018-12-31 DIAGNOSIS — K279 Peptic ulcer, site unspecified, unspecified as acute or chronic, without hemorrhage or perforation: Secondary | ICD-10-CM

## 2018-12-31 DIAGNOSIS — K219 Gastro-esophageal reflux disease without esophagitis: Secondary | ICD-10-CM | POA: Diagnosis not present

## 2018-12-31 MED ORDER — SUCRALFATE 1 GM/10ML PO SUSP: 1.0000 g | Freq: Four times a day (QID) | ORAL | 1 refills | Status: DC | PRN

## 2018-12-31 NOTE — Assessment & Plan Note (Signed)
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  She continues to avoid NSAIDs.  No reason to suspect recurrent peptic ulcer disease at this time.  Recommend she continue her current medications and follow-up in 2 months.

## 2018-12-31 NOTE — Progress Notes (Signed)
Referring Provider: Sharion Balloon, FNP Primary Care Physician:  Sharion Balloon, FNP Primary GI:  Dr. Gala Romney  Chief Complaint  Patient presents with  . Peptic Ulcer Disease    f/u. Reports CP at night around 7-8pm    HPI:   Brittany Durham is a 48 y.o. female who presents for follow-up because she is due for colonoscopy.  Previous colonoscopy completed 08/23/2016 which found diverticulosis in the entire examined colon, redundant colon, otherwise normal.  Recommended repeat colonoscopy in 10 years (2028).  Recommended follow-up in the GI clinic in 1 year.  Patient was last seen in our office 11/26/2017 for GERD, peptic ulcer disease.  At that time she was doing well, NSAID induced peptic ulcer disease healed and follow-up EGD.  Protonix daily controlling her GERD symptoms.  No alarm features.  Recommended continue Protonix 40 mg, GERD information provided, avoid all NSAIDs, follow-up in 1 year.  Today she states she's doing ok overall. States she has "chest pain" every night at 7:00 pm, just started, happens every night. Described as sharp. Started 3 nights ago. She also will sometimes have some dyspnea with this. She has not told her PCP about this. Last meal typically 6:30-7:00 pm. Mostly her symptoms occur if she's laying down. Denies abdominal pain, N/V, hematochezia, melena, fever, chills, unintentional weight loss. Denies URI or flu-like symptoms. Denies loss of sense of taste or smell. Denies chest pain, dyspnea, dizziness, lightheadedness, syncope, near syncope. Denies any other upper or lower GI symptoms.  Currently on Protonix once a day. Denies NSAIDs and ASA powders.  Past Medical History:  Diagnosis Date  . Carotidynia   . DDD (degenerative disc disease), cervical   . Hypertension   . Knee pain    left meniscus tear s/p repair 11/2014  . Seizures ([place])   . Stuttering    patient reports this started after her knee surgery in 11/2014.   Marland Kitchen Thyroid nodule     Past Surgical  History:  Procedure Laterality Date  . COLONOSCOPY N/A 08/23/2016   Procedure: COLONOSCOPY;  Surgeon: Daneil Dolin, MD;  [facility];  Service: Endoscopy;  Laterality: N/A;  1:00pm  . ESOPHAGOGASTRODUODENOSCOPY N/A 05/03/2016   Procedure: ESOPHAGOGASTRODUODENOSCOPY (EGD);  Surgeon: Daneil Dolin, MD;  [facility];  Service: Endoscopy;  Laterality: N/A;  . ESOPHAGOGASTRODUODENOSCOPY N/A 08/23/2016   Procedure: ESOPHAGOGASTRODUODENOSCOPY (EGD);  Surgeon: Daneil Dolin, MD;  [facility];  Service: Endoscopy;  Laterality: N/A;  . KNEE ARTHROSCOPY WITH MEDIAL MENISECTOMY Left 12/09/2014   Procedure: KNEE ARTHROSCOPY WITH PARTIAL MEDIAL AND LATERAL MENISECTOMY;  Surgeon: Sanjuana Kava, MD;  Location: [facility];  Service: Orthopedics;  Laterality: Left;  . TUBAL LIGATION    . WISDOM TOOTH EXTRACTION      Current Outpatient Medications  Medication Sig Dispense Refill  . Acetaminophen (TYLENOL PO) Take by mouth.    Marland Kitchen amLODipine (NORVASC) 10 MG tablet Take 1 tablet (10 mg total) by mouth daily. 90 tablet 1  . cyclobenzaprine (FLEXERIL) 5 MG tablet Take 1 tablet (5 mg total) by mouth 3 (three) times daily as needed. 30 tablet 0  . gabapentin (NEURONTIN) 300 MG capsule Take 1 capsule (300 mg total) by mouth 3 (three) times daily. 90 capsule 0  . lisinopril (PRINIVIL,ZESTRIL) 20 MG tablet TAKE 1 TABLET DAILY 90 tablet 1  . pantoprazole (PROTONIX) 40 MG tablet Take 1 tablet (40 mg total) by mouth daily before breakfast. 30 tablet 11  . Vitamin  D, Ergocalciferol, (DRISDOL) 1.25 MG (50000 UT) CAPS capsule TAKE 1 CAPSULE ONCE A WEEK 12 capsule 3   No current facility-administered medications for this visit.     Allergies as of 12/31/2018  . (No Known Allergies)    Family History  Problem Relation Age of Onset  . Hypertension Mother   . Hypertension Brother   . Colon cancer Neg Hx     Social History   Socioeconomic History  . Marital status: Single    Spouse  name: Not on file  . Number of children: 1  . Years of education: 44  . Highest education level: Not on file  Occupational History  . Occupation: Umemployed  Social Needs  . Financial resource strain: Not hard at all  . Food insecurity    Worry: Never true    Inability: Never true  . Transportation needs    Medical: No    Non-medical: No  Tobacco Use  . Smoking status: Never Smoker  . Smokeless tobacco: Never Used  Substance and Sexual Activity  . Alcohol use: No  . Drug use: No  . Sexual activity: Not Currently    Birth control/protection: Surgical  Lifestyle  . Physical activity    Days per week: 0 days    Minutes per session: 0 min  . Stress: Not at all  Relationships  . Social connections    Talks on phone: More than three times a week    Gets together: More than three times a week    Attends religious service: Not on file    Active member of club or organization: No    Attends meetings of clubs or organizations: Never    Relationship status: Not on file  Other Topics Concern  . Not on file  Social History Narrative   Lives at home alone.   Right-handed.   Occasional caffeine use.    Review of Systems: General: Negative for anorexia, weight loss, fever, chills, fatigue, weakness. Eyes: Negative for vision changes.  ENT: Negative for hoarseness, difficulty swallowing , nasal congestion. CV: Negative for chest pain, angina, palpitations, dyspnea on exertion, peripheral edema.  Respiratory: Negative for dyspnea at rest, dyspnea on exertion, cough, sputum, wheezing.  GI: See history of present illness. GU:  Negative for dysuria, hematuria, urinary incontinence, urinary frequency, nocturnal urination.  MS: Negative for joint pain, low back pain.  Derm: Negative for rash or itching.  Neuro: Negative for weakness, abnormal sensation, seizure, frequent headaches, memory loss, confusion.  Psych: Negative for anxiety, depression, suicidal ideation, hallucinations.   Endo: Negative for unusual weight change.  Heme: Negative for bruising or bleeding. Allergy: Negative for rash or hives.   Physical Exam: BP (!) 147/86   Pulse 96   Temp (!) 97 F (36.1 C) (Oral)   Ht 5\' 7"  (1.702 m)   Wt 217 lb 3.2 oz (98.5 kg)   BMI 34.02 kg/m  General:   Alert and oriented. Pleasant and cooperative. Well-nourished and well-developed.  Head:  Normocephalic and atraumatic. Eyes:  Without icterus, sclera clear and conjunctiva pink.  Ears:  Normal auditory acuity. Mouth:  No deformity or lesions, oral mucosa pink.  Throat/Neck:  Supple, without mass or thyromegaly. Cardiovascular:  S1, S2 present without murmurs appreciated. Normal pulses noted. Extremities without clubbing or edema. Respiratory:  Clear to auscultation bilaterally. No wheezes, rales, or rhonchi. No distress.  Gastrointestinal:  +BS, soft, non-tender and non-distended. No HSM noted. No guarding or rebound. No masses appreciated.  Rectal:  Deferred  Musculoskalatal:  Symmetrical without gross deformities. Normal posture. Skin:  Intact without significant lesions or rashes. Neurologic:  Alert and oriented x4;  grossly normal neurologically. Psych:  Alert and cooperative. Normal mood and affect. Heme/Lymph/Immune: No significant cervical adenopathy. No excessive bruising noted.    12/31/2018 4:13 PM   Disclaimer: This note was dictated with voice recognition software. Similar sounding words can inadvertently be transcribed and may not be corrected upon review.

## 2018-12-31 NOTE — Assessment & Plan Note (Signed)
History of GERD and is on Protonix 40 mg daily.  3 days ago she began having "chest pain" every night about 30 minutes after eating, when lying down.  I recommended she stay upright or sitting for at least 3 to 4 hours after eating.  I sent in Carafate to take as needed for symptoms to see if this helps.  Continue current acid blocker.  Contact primary care today or tomorrow to follow-up for chest pain and cardiac work-up.  Return for follow-up in 2 months and call for any worsening or severe symptoms.  ER precautions were given as well related to chest pain and need for emergency services.

## 2018-12-31 NOTE — Patient Instructions (Signed)
Your health issues we discussed today were:   Chest pain: 1. This could be worsening reflux/heartburn 2. Continue taking your acid blocker once a day 3. I am sending in Carafate to your pharmacy.  Uses as needed for chest pain 4. Try to stay sitting upright or standing for at least 3 to 4 hours after your last meal of the day 5. As we discussed, call your primary care provider today or tomorrow and request to be seen for chest pain or shortness of breath 6. Call us if you have any worsening or severe symptoms 7. If you develop significant chest pain symptoms especially with shortness of breath, you should be seen in emergency department.  Overall I recommend:  1. Continue your current medications 2. Return for follow-up in 2 months 3. Call us if you have any questions or concerns.   Because of recent events of COVID-19 ("Coronavirus"), follow CDC recommendations:  Wash your hand frequently Avoid touching your face Stay away from people who are sick If you have symptoms such as fever, cough, shortness of breath then call your healthcare provider for further guidance If you are sick, STAY AT HOME unless otherwise directed by your healthcare provider. Follow directions from state and national officials regarding staying safe   At [facility] we value your feedback. You may receive a survey about your visit today. Please share your experience as we strive to create trusting relationships with our patients to provide genuine, compassionate, quality care.  We appreciate your understanding and patience as we review any laboratory studies, imaging, and other diagnostic tests that are ordered as we care for you. Our office policy is 5 business days for review of these results, and any emergent or urgent results are addressed in a timely manner for your best interest. If you do not hear from our office in 1 week, please contact us.   We also encourage the use of MyChart, which  contains your medical information for your review as well. If you are not enrolled in this feature, an access code is on this after visit summary for your convenience. Thank you for allowing [place] to be involved in your care.  It was great to see you today!  I hope you have a great summer!!

## 2018-12-31 NOTE — Assessment & Plan Note (Signed)
Today the patient complains of 3 days of chest pain in the evenings around 7 PM.  Of note, she typically eats 6 to 6:30 PM.  She typically is laying down when her symptoms occur.  She does have some shortness of breath associated with this.  While this could be worsening GERD/reflux I want her to contact her primary care for a further evaluation to ensure no cardiac etiology.  She states that she will call her primary care today or tomorrow and request to be seen.  Follow-up in 2 months.  Further treatment of GERD as per above.

## 2019-01-01 ENCOUNTER — Encounter: Payer: Self-pay | Admitting: Nurse Practitioner

## 2019-01-05 ENCOUNTER — Other Ambulatory Visit: Payer: Self-pay

## 2019-01-05 ENCOUNTER — Emergency Department (HOSPITAL_COMMUNITY)
Admission: EM | Admit: 2019-01-05 | Discharge: 2019-01-06 | Disposition: A | Discharge status: Home / Self Care | Payer: Medicare Other | Attending: Emergency Medicine | Admitting: Emergency Medicine

## 2019-01-05 ENCOUNTER — Encounter (HOSPITAL_COMMUNITY): Payer: Self-pay | Admitting: Emergency Medicine

## 2019-01-05 DIAGNOSIS — R000 Tachycardia, unspecified: Secondary | ICD-10-CM | POA: Diagnosis not present

## 2019-01-05 DIAGNOSIS — R0602 Shortness of breath: Secondary | ICD-10-CM | POA: Insufficient documentation

## 2019-01-05 DIAGNOSIS — I10 Essential (primary) hypertension: Secondary | ICD-10-CM | POA: Insufficient documentation

## 2019-01-05 DIAGNOSIS — R071 Chest pain on breathing: Secondary | ICD-10-CM | POA: Diagnosis not present

## 2019-01-05 DIAGNOSIS — R0689 Other abnormalities of breathing: Secondary | ICD-10-CM | POA: Diagnosis not present

## 2019-01-05 DIAGNOSIS — Z79899 Other long term (current) drug therapy: Secondary | ICD-10-CM | POA: Insufficient documentation

## 2019-01-05 DIAGNOSIS — R0789 Other chest pain: Secondary | ICD-10-CM | POA: Diagnosis not present

## 2019-01-05 DIAGNOSIS — Z20828 Contact with and (suspected) exposure to other viral communicable diseases: Secondary | ICD-10-CM | POA: Diagnosis not present

## 2019-01-05 DIAGNOSIS — R079 Chest pain, unspecified: Secondary | ICD-10-CM | POA: Diagnosis not present

## 2019-01-05 NOTE — ED Triage Notes (Signed)
PT c/o chest pain and was given 324mg  of asa and 1 nitro. Pt now c/o headache.

## 2019-01-06 ENCOUNTER — Emergency Department (HOSPITAL_COMMUNITY): Payer: Medicare Other

## 2019-01-06 DIAGNOSIS — R0789 Other chest pain: Secondary | ICD-10-CM | POA: Diagnosis not present

## 2019-01-06 DIAGNOSIS — R079 Chest pain, unspecified: Secondary | ICD-10-CM | POA: Diagnosis not present

## 2019-01-06 LAB — TROPONIN I (HIGH SENSITIVITY)
Troponin I (High Sensitivity): 6 ng/L (ref <18)
Troponin I (High Sensitivity): 7 ng/L (ref <18)

## 2019-01-06 LAB — CBC WITH DIFFERENTIAL/PLATELET
Abs Immature Granulocytes: 0.03 10*3/uL (ref 0.00–0.07)
Basophils Absolute: 0 10*3/uL (ref 0.0–0.1)
Basophils Relative: 0 %
Eosinophils Absolute: 0.1 10*3/uL (ref 0.0–0.5)
Eosinophils Relative: 1 %
HCT: 37.8 % (ref 36.0–46.0)
Hemoglobin: 12.3 g/dL (ref 12.0–15.0)
Immature Granulocytes: 0 %
Lymphocytes Relative: 25 %
Lymphs Abs: 2 10*3/uL (ref 0.7–4.0)
MCH: 28.8 pg (ref 26.0–34.0)
MCHC: 32.5 g/dL (ref 30.0–36.0)
MCV: 88.5 fL (ref 80.0–100.0)
Monocytes Absolute: 0.6 10*3/uL (ref 0.1–1.0)
Monocytes Relative: 7 %
Neutro Abs: 5.5 10*3/uL (ref 1.7–7.7)
Neutrophils Relative %: 67 %
Platelets: 204 10*3/uL (ref 150–400)
RBC: 4.27 MIL/uL (ref 3.87–5.11)
RDW: 12.9 % (ref 11.5–15.5)
WBC: 8.2 10*3/uL (ref 4.0–10.5)
nRBC: 0 % (ref 0.0–0.2)

## 2019-01-06 LAB — BASIC METABOLIC PANEL
Anion gap: 9 (ref 5–15)
BUN: 14 mg/dL (ref 6–20)
CO2: 25 mmol/L (ref 22–32)
Calcium: 9.7 mg/dL (ref 8.9–10.3)
Chloride: 104 mmol/L (ref 98–111)
Creatinine, Ser: 0.74 mg/dL (ref 0.44–1.00)
GFR calc Af Amer: >60 mL/min (ref >60)
GFR calc non Af Amer: >60 mL/min (ref >60)
Glucose, Bld: 121 mg/dL — ABNORMAL HIGH (ref 70–99)
Potassium: 4.2 mmol/L (ref 3.5–5.1)
Sodium: 138 mmol/L (ref 135–145)

## 2019-01-06 LAB — D-DIMER, QUANTITATIVE: D-Dimer, Quant: 0.4 ug/mL-FEU (ref 0.00–0.50)

## 2019-01-06 MED ORDER — LORAZEPAM 2 MG/ML IJ SOLN
1.0000 mg | Freq: Once | INTRAMUSCULAR | Status: AC
  Administered 2019-01-06: 1 mg via INTRAVENOUS
  Filled 2019-01-06: qty 1

## 2019-01-06 MED ORDER — SODIUM CHLORIDE 0.9 % IV BOLUS
1000.0000 mL | Freq: Once | INTRAVENOUS | Status: AC
  Administered 2019-01-06: 1000 mL via INTRAVENOUS

## 2019-01-06 MED ORDER — OXYCODONE-ACETAMINOPHEN 5-325 MG PO TABS
1.0000 | ORAL_TABLET | Freq: Once | ORAL | Status: AC
  Administered 2019-01-06: 1 via ORAL
  Filled 2019-01-06: qty 1

## 2019-01-06 MED ORDER — KETOROLAC TROMETHAMINE 30 MG/ML IJ SOLN
30.0000 mg | Freq: Once | INTRAMUSCULAR | Status: AC
  Administered 2019-01-06: 30 mg via INTRAVENOUS
  Filled 2019-01-06: qty 1

## 2019-01-06 NOTE — Discharge Instructions (Addendum)
You were seen today for chest pain.  Your work-up is reassuring.  Follow-up with cardiology for reassessment if you have any ongoing concerns.

## 2019-01-06 NOTE — ED Provider Notes (Signed)
[facility] EMERGENCY DEPARTMENT Provider Note   CSN: [id number] Arrival date & time: 01/05/19  2348     History   Chief Complaint Chief Complaint  Patient presents with  . Chest Pain    HPI Brittany Durham is a 48 y.o. female.     HPI  This is a 48 year old female with a history of hypertension who presents with chest pain.  Patient reports several day history of chest pain.  She reports that it is anterior and sharp in nature.  Worse with breathing.  Denies any fever or cough.  She does report shortness of breath.  No history of blood clots or lower extremity swelling.  She was given aspirin and 1 nitroglycerin prior to arrival with no significant relief.  She denies any exertional complaints.  Currently she rates her pain at 9 out of 10.  She did not take anything at home for her pain.  Past Medical History:  Diagnosis Date  . Carotidynia   . DDD (degenerative disc disease), cervical   . Hypertension   . Knee pain    left meniscus tear s/p repair 11/2014  . Seizures ([place])   . Stuttering    patient reports this started after her knee surgery in 11/2014.   Marland Kitchen Thyroid nodule     Patient Active Problem List   Diagnosis Date Noted  . Chest pain 12/31/2018  . Osteoarthritis 11/18/2017  . Insomnia 11/18/2017  . 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 Tear of lateral meniscus of right knee, current 10/28/2017  . 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 Language difficulty 09/25/2016  . Nausea with vomiting 07/18/2016  . PUD (peptic ulcer disease) 07/18/2016  . Slurred speech   . GI bleed 05/02/2016  . Upper GI bleed 05/02/2016  . Sinus tachycardia 05/02/2016  . Acute blood loss anemia 05/02/2016  . Abdominal pain, epigastric 05/02/2016  . 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 Thyroid nodule 12/14/2014  . Left knee pain 12/14/2014  . Seizures ([place])   . Altered mental status 12/12/2014  . Vitamin D deficiency 06/30/2014  . GERD (gastroesophageal reflux  disease) 06/29/2014  . SYNCOPE 03/02/2010  . HYPERTENSION, BENIGN 03/01/2010    Past Surgical History:  Procedure Laterality Date  . COLONOSCOPY N/A 08/23/2016   Procedure: COLONOSCOPY;  Surgeon: Daneil Dolin, MD;  Location: [facility];  Service: Endoscopy;  Laterality: N/A;  1:00pm  . ESOPHAGOGASTRODUODENOSCOPY N/A 05/03/2016   Procedure: ESOPHAGOGASTRODUODENOSCOPY (EGD);  Surgeon: Daneil Dolin, MD;  Location: [facility];  Service: Endoscopy;  Laterality: N/A;  . ESOPHAGOGASTRODUODENOSCOPY N/A 08/23/2016   Procedure: ESOPHAGOGASTRODUODENOSCOPY (EGD);  Surgeon: Daneil Dolin, MD;  Location: [facility];  Service: Endoscopy;  Laterality: N/A;  . KNEE ARTHROSCOPY WITH MEDIAL MENISECTOMY Left 12/09/2014   Procedure: KNEE ARTHROSCOPY WITH PARTIAL MEDIAL AND LATERAL MENISECTOMY;  Surgeon: Sanjuana Kava, MD;  Location: [facility] ORS;  Service: Orthopedics;  Laterality: Left;  . TUBAL LIGATION    . WISDOM TOOTH EXTRACTION       OB History    Gravida  1   Para  1   Term  1   Preterm      AB      Living  1     SAB      TAB      Ectopic      Multiple      Live Births               Home Medications  Prior to Admission medications   Medication Sig Start Date End Date Taking? Authorizing Provider  Acetaminophen (TYLENOL PO) Take by mouth.    [provider]  amLODipine (NORVASC) 10 MG tablet Take 1 tablet (10 mg total) by mouth daily. 09/17/18   Sharion Balloon, FNP  cyclobenzaprine (FLEXERIL) 5 MG tablet Take 1 tablet (5 mg total) by mouth 3 (three) times daily as needed. 07/07/18   Rolland Porter, MD  gabapentin (NEURONTIN) 300 MG capsule Take 1 capsule (300 mg total) by mouth 3 (three) times daily. 08/05/18   Sharion Balloon, FNP  lisinopril (PRINIVIL,ZESTRIL) 20 MG tablet TAKE 1 TABLET DAILY 07/15/18   Evelina Dun A, FNP  pantoprazole (PROTONIX) 40 MG tablet Take 1 tablet (40 mg total) by mouth daily before breakfast. 09/17/18   Sharion Balloon, FNP   sucralfate (CARAFATE) 1 GM/10ML suspension Take 10 mLs (1 g total) by mouth 4 (four) times daily as needed (chest pain/reflux). 12/31/18   Carlis Stable, NP  Vitamin D, Ergocalciferol, (DRISDOL) 1.25 MG (50000 UT) CAPS capsule TAKE 1 CAPSULE ONCE A WEEK 09/17/18   Sharion Balloon, FNP    Family History Family History  Problem Relation Age of Onset  . Hypertension Mother   . Hypertension Brother   . Colon cancer Neg Hx     Social History Social History   Tobacco Use  . Smoking status: Never Smoker  . Smokeless tobacco: Never Used  Substance Use Topics  . Alcohol use: No  . Drug use: No     Allergies   Patient has no known allergies.   Review of Systems Review of Systems  Constitutional: Negative for fever.  Respiratory: Positive for shortness of breath. Negative for cough.   Cardiovascular: Positive for chest pain. Negative for leg swelling.  Gastrointestinal: Negative for abdominal pain, nausea and vomiting.  Genitourinary: Negative for dysuria.  All other systems reviewed and are negative.    Physical Exam Updated Vital Signs BP (!) 144/82   Pulse (!) 115   Temp 98.7 F (37.1 C) (Oral)   Resp (!) 32   SpO2 97%   Physical Exam Vitals signs and nursing note reviewed.  Constitutional:      Appearance: She is well-developed. She is obese. She is not ill-appearing.  HENT:     Head: Normocephalic and atraumatic.  Eyes:     Pupils: Pupils are equal, round, and reactive to light.  Neck:     Musculoskeletal: Neck supple.  Cardiovascular:     Rate and Rhythm: Regular rhythm. Tachycardia present.     Heart sounds: Normal heart sounds.  Pulmonary:     Effort: Pulmonary effort is normal. No respiratory distress.     Breath sounds: No wheezing.  Chest:     Chest wall: No tenderness.  Abdominal:     General: Bowel sounds are normal.     Palpations: Abdomen is soft.  Musculoskeletal:     Right lower leg: She exhibits no tenderness. No edema.     Left lower leg:  She exhibits no tenderness. No edema.  Skin:    General: Skin is warm and dry.  Neurological:     Mental Status: She is alert and oriented to person, place, and time.  Psychiatric:     Comments: Anxious appearing and tearful      ED Treatments / Results  Labs (all labs ordered are listed, but only abnormal results are displayed) Labs Reviewed  BASIC METABOLIC PANEL - Abnormal; Notable for  the following components:      Result Value   Glucose, Bld 121 (*)    All other components within normal limits  CBC WITH DIFFERENTIAL/PLATELET  D-DIMER, QUANTITATIVE (NOT AT [facility])  TROPONIN I (HIGH SENSITIVITY)  TROPONIN I (HIGH SENSITIVITY)    EKG EKG Interpretation  Date/Time:  Monday January 05 2019 23:58:34 EDT Ventricular Rate:  122 PR Interval:    QRS Duration: 82 QT Interval:  311 QTC Calculation: 443 R Axis:   55 Text Interpretation:  Sinus tachycardia Confirmed by Thayer Jew [id number]) on 01/06/2019 12:04:12 AM   Radiology Dg Chest Portable 1 View  Result Date: 01/06/2019 CLINICAL DATA:  Chest pain and headache EXAM: PORTABLE CHEST 1 VIEW COMPARISON:  06/14/2018 FINDINGS: The heart size and mediastinal contours are within normal limits. Both lungs are clear. The visualized skeletal structures are unremarkable. IMPRESSION: Clear lungs Electronically Signed   By: Ulyses Jarred M.D.   On: 01/06/2019 01:29    Procedures Procedures (including critical care time)  Medications Ordered in ED Medications  oxyCODONE-acetaminophen (PERCOCET/ROXICET) 5-325 MG per tablet 1 tablet (1 tablet Oral Given 01/06/19 0039)  ketorolac (TORADOL) 30 MG/ML injection 30 mg (30 mg Intravenous Given 01/06/19 0220)  LORazepam (ATIVAN) injection 1 mg (1 mg Intravenous Given 01/06/19 0309)  sodium chloride 0.9 % bolus 1,000 mL (1,000 mLs Intravenous New Bag/Given 01/06/19 0309)     Initial Impression / Assessment and Plan / ED Course  I have reviewed the triage vital signs and the nursing notes.   Pertinent labs & imaging results that were available during my care of the patient were reviewed by me and considered in my medical decision making (see chart for details).  Clinical Course as of Jan 06 319  Tue Jan 06, 2019  0319 Work-up negative.  On recheck, patient reports improvement of symptoms.  Repeat heart rate is in the 90s.   [CH]    Clinical Course User Index [CH] Wei Newbrough, Barbette Hair, MD       Patient presents with chest pain.  Atypical in nature.  Ongoing since yesterday.  She is overall nontoxic-appearing.  She does appear anxious and she is tachycardic.  Her physical exam is otherwise benign.  Chest pain is atypical for ACS.  EKG shows sinus tachycardia.  No arrhythmia or ischemic changes.  Chest x-ray shows no pneumothorax or pneumonia.  Initial troponin is 6.  Repeat troponin is 7.  Doubt ACS.  D-dimer is negative and doubt PE.  On recheck, patient states she feels much better after Toradol, Ativan, fluids.  Will recommend close follow-up.  After history, exam, and medical workup I feel the patient has been appropriately medically screened and is safe for discharge home. Pertinent diagnoses were discussed with the patient. Patient was given return precautions.   Final Clinical Impressions(s) / ED Diagnoses   Final diagnoses:  Atypical chest pain    ED Discharge Orders    None       Abrian Hanover, Barbette Hair, MD 01/06/19 [phone]

## 2019-01-08 DIAGNOSIS — M79672 Pain in left foot: Secondary | ICD-10-CM | POA: Diagnosis not present

## 2019-01-08 DIAGNOSIS — M722 Plantar fascial fibromatosis: Secondary | ICD-10-CM | POA: Diagnosis not present

## 2019-01-09 ENCOUNTER — Other Ambulatory Visit: Payer: Self-pay | Admitting: Family

## 2019-01-29 ENCOUNTER — Encounter: Payer: Self-pay | Admitting: Cardiology

## 2019-01-29 ENCOUNTER — Other Ambulatory Visit: Payer: Self-pay

## 2019-01-29 ENCOUNTER — Ambulatory Visit (INDEPENDENT_AMBULATORY_CARE_PROVIDER_SITE_OTHER): Payer: Medicare Other | Admitting: Cardiology

## 2019-01-29 VITALS — BP 156/102 | HR 114 | Temp 97.0°F | Ht 67.0 in | Wt 220.0 lb

## 2019-01-29 DIAGNOSIS — Z01812 Encounter for preprocedural laboratory examination: Secondary | ICD-10-CM

## 2019-01-29 DIAGNOSIS — R072 Precordial pain: Secondary | ICD-10-CM | POA: Diagnosis not present

## 2019-01-29 DIAGNOSIS — Z7189 Other specified counseling: Secondary | ICD-10-CM

## 2019-01-29 DIAGNOSIS — M5412 Radiculopathy, cervical region: Secondary | ICD-10-CM | POA: Diagnosis not present

## 2019-01-29 LAB — LIPID PANEL
Chol/HDL Ratio: 3.4 ratio (ref 0.0–4.4)
Cholesterol, Total: 196 mg/dL (ref 100–199)
HDL: 58 mg/dL (ref >39)
LDL Chol Calc (NIH): 121 mg/dL — ABNORMAL HIGH (ref 0–99)
Triglycerides: 93 mg/dL (ref 0–149)
VLDL Cholesterol Cal: 17 mg/dL (ref 5–40)

## 2019-01-29 LAB — BASIC METABOLIC PANEL
BUN/Creatinine Ratio: 16 (ref 9–23)
BUN: 14 mg/dL (ref 6–24)
CO2: 25 mmol/L (ref 20–29)
Calcium: 10.3 mg/dL — ABNORMAL HIGH (ref 8.7–10.2)
Chloride: 101 mmol/L (ref 96–106)
Creatinine, Ser: 0.86 mg/dL (ref 0.57–1.00)
GFR calc Af Amer: 91 mL/min/{1.73_m2} (ref >59)
GFR calc non Af Amer: 79 mL/min/{1.73_m2} (ref >59)
Glucose: 88 mg/dL (ref 65–99)
Potassium: 4.6 mmol/L (ref 3.5–5.2)
Sodium: 141 mmol/L (ref 134–144)

## 2019-01-29 MED ORDER — ALPRAZOLAM 0.5 MG PO TABS: 0.5000 mg | ORAL_TABLET | Freq: Once | ORAL | 0 refills | Status: AC

## 2019-01-29 MED ORDER — METOPROLOL TARTRATE 100 MG PO TABS: ORAL_TABLET | ORAL | 0 refills | Status: DC

## 2019-01-29 NOTE — Progress Notes (Addendum)
Cardiology Office Note:    Date:  01/29/2019   ID:  Brittany Durham, DOB 08/29/1970, MRN [id number]  PCP:  Sharion Balloon, FNP  Cardiologist:  Buford Dresser, MD PhD  Referring MD: Sharion Balloon, FNP   CC: chest pain  History of Present Illness:    Brittany Durham is a 48 y.o. female with a hx of hypertension, speech disorder who is seen as a new consult at the request of Sharion Balloon, FNP for the evaluation and management of chest pain.  Brittany Durham has an undefined speech issue at baseline. Here with daughter today to review history and symptoms. We reviewed her recent visit to the [facility] ER 01/06/19 for chest pain. Had gone frequently in the past but had not been in some time. That event was the result of gradually increasing central, sharp chest pain, worse with deep breathing, that became so severe that she sought emergency medical attention. At that visit, HsTn 6-> 7, not consistent with ACS. D-dimer normal. CXR without acute changes. ECG was sinus tach. She improved with toradal, ativan, and fluids.  Chest pain history: -Initial onset: ~10 years ago -Quality: sharp, central, nonradiating, sometimes pleuritic -Frequency: had two years between event in August and prior event. -Duration: lasts until she gets help in ER -Associated symptoms: short of breath, nauseated, dizzy. Distant syncope but none recently (2-3 years ago) -Aggravating/alleviating factors: better when she sleeps, better with sucralfate but then comes back -Prior cardiac history: no MI. 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 Has never seen neurologist. -Prior ECG: sinus tachycardia -Prior workup: echo 2018 normal -Prior treatment: carafate -Alcohol: never -Tobacco: never -Comorbidities: HTN. Never told she has high cholesterol. No diabetes. -Exercise level: watches 7 year old grandson, does get tired easily -Cardiac ROS: no  shortness of breath, no PND, no orthopnea, no LE edema, no syncope -Family history: none  Past Medical History:  Diagnosis Date  . Carotidynia   . DDD (degenerative disc disease), cervical   . Hypertension   . Knee pain    left meniscus tear s/p repair 11/2014  . Seizures ([place])   . Stuttering    patient reports this started after her knee surgery in 11/2014.   Marland Kitchen Thyroid nodule     Past Surgical History:  Procedure Laterality Date  . COLONOSCOPY N/A 08/23/2016   Procedure: COLONOSCOPY;  Surgeon: Daneil Dolin, MD;  Location: [facility];  Service: Endoscopy;  Laterality: N/A;  1:00pm  . ESOPHAGOGASTRODUODENOSCOPY N/A 05/03/2016   Procedure: ESOPHAGOGASTRODUODENOSCOPY (EGD);  Surgeon: Daneil Dolin, MD;  Location: [facility];  Service: Endoscopy;  Laterality: N/A;  . ESOPHAGOGASTRODUODENOSCOPY N/A 08/23/2016   Procedure: ESOPHAGOGASTRODUODENOSCOPY (EGD);  Surgeon: Daneil Dolin, MD;  Location: [facility];  Service: Endoscopy;  Laterality: N/A;  . KNEE ARTHROSCOPY WITH MEDIAL MENISECTOMY Left 12/09/2014   Procedure: KNEE ARTHROSCOPY WITH PARTIAL MEDIAL AND LATERAL MENISECTOMY;  Surgeon: Sanjuana Kava, MD;  Location: [facility] ORS;  Service: Orthopedics;  Laterality: Left;  . TUBAL LIGATION    . WISDOM TOOTH EXTRACTION      Current Medications: Current Outpatient Medications on File Prior to Visit  Medication Sig  . Acetaminophen (TYLENOL PO) Take by mouth.  Marland Kitchen amLODipine (NORVASC) 10 MG tablet Take 1 tablet (10 mg total) by mouth daily.  . cyclobenzaprine (FLEXERIL) 5 MG tablet Take 1 tablet (5 mg total) by mouth 3 (three) times daily as needed.  Marland Kitchen  gabapentin (NEURONTIN) 300 MG capsule Take 1 capsule (300 mg total) by mouth 3 (three) times daily.  Marland Kitchen lisinopril (ZESTRIL) 20 MG tablet TAKE 1 TABLET DAILY  . pantoprazole (PROTONIX) 40 MG tablet Take 1 tablet (40 mg total) by mouth daily before breakfast.  . sucralfate (CARAFATE) 1 GM/10ML suspension Take 10 mLs (1 g total) by mouth 4  (four) times daily as needed (chest pain/reflux).  . Vitamin D, Ergocalciferol, (DRISDOL) 1.25 MG (50000 UT) CAPS capsule TAKE 1 CAPSULE ONCE A WEEK   No current facility-administered medications on file prior to visit.      Allergies:   Patient has no known allergies.   Social History   Socioeconomic History  . Marital status: Single    Spouse name: Not on file  . Number of children: 1  . Years of education: 39  . Highest education level: Not on file  Occupational History  . Occupation: Umemployed  Social Needs  . Financial resource strain: Not hard at all  . Food insecurity    Worry: Never true    Inability: Never true  . Transportation needs    Medical: No    Non-medical: No  Tobacco Use  . Smoking status: Never Smoker  . Smokeless tobacco: Never Used  Substance and Sexual Activity  . Alcohol use: No  . Drug use: No  . Sexual activity: Not Currently    Birth control/protection: Surgical  Lifestyle  . Physical activity    Days per week: 0 days    Minutes per session: 0 min  . Stress: Not at all  Relationships  . Social connections    Talks on phone: More than three times a week    Gets together: More than three times a week    Attends religious service: Not on file    Active member of club or organization: No    Attends meetings of clubs or organizations: Never    Relationship status: Not on file  Other Topics Concern  . Not on file  Social History Narrative   Lives at home alone.   Right-handed.   Occasional caffeine use.     Family History: The patient's family history includes Hypertension in her brother and mother. There is no history of Colon cancer.  ROS:   Please see the history of present illness.  Additional pertinent ROS: Constitutional: Negative for chills, fever, night sweats, unintentional weight loss  HENT: Negative for ear pain and hearing loss.   Eyes: Negative for loss of vision and eye pain.  Respiratory: Negative for cough, sputum,  wheezing.   Cardiovascular: See HPI. Gastrointestinal: Negative for abdominal pain, melena, and hematochezia.  Genitourinary: Negative for dysuria and hematuria.  Musculoskeletal: Negative for falls and myalgias.  Skin: Negative for itching and rash.  Neurological: Negative for focal weakness, focal sensory changes and loss of consciousness.  Endo/Heme/Allergies: Does not bruise/bleed easily.     EKGs/Labs/Other Studies Reviewed:    The following studies were reviewed today: Echo 2018, 2016 personally reviewed  EKG:  EKG is personally reviewed.  The ekg ordered today demonstrates sinus tachycardia at 101 bpm  Recent Labs: 06/30/2018: TSH 2.660 01/06/2019: BUN 14; Creatinine, Ser 0.74; Hemoglobin 12.3; Platelets 204; Potassium 4.2; Sodium 138  Recent Lipid Panel    Component Value Date/Time   CHOL 168 04/30/2017 1601   TRIG 88 04/30/2017 1601   HDL 49 04/30/2017 1601   CHOLHDL 3.4 04/30/2017 1601   CHOLHDL 2.9 12/13/2014 0613   VLDL 10 12/13/2014 [id number]  LDLCALC 101 (H) 04/30/2017 1601    Physical Exam:    VS:  BP (!) 156/102   Pulse (!) 114   Temp (!) 97 F (36.1 C)   Ht 5\' 7"  (1.702 m)   Wt 220 lb (99.8 kg)   SpO2 97%   BMI 34.46 kg/m     Wt Readings from Last 3 Encounters:  01/31/19 220 lb (99.8 kg)  01/29/19 220 lb (99.8 kg)  12/31/18 217 lb 3.2 oz (98.5 kg)    GEN: Well nourished, well developed in no acute distress HEENT: Normal, moist mucous membranes NECK: No JVD CARDIAC: regular rhythm, normal S1 and S2, no murmurs, rubs, gallops.  VASCULAR: Radial and DP pulses 2+ bilaterally. No carotid bruits RESPIRATORY:  Clear to auscultation without rales, wheezing or rhonchi  ABDOMEN: Soft, non-tender, non-distended MUSCULOSKELETAL:  Ambulates independently SKIN: Warm and dry, no edema NEUROLOGIC:  Alert and oriented x 3. No focal neuro deficits noted. PSYCHIATRIC:  Normal affect    ASSESSMENT:    1. Precordial pain   2. Pre-procedure lab exam   3. Cardiac  risk counseling   4. Counseling on health promotion and disease prevention    PLAN:    Precordial pain: atypical in nature. Nonexertional, occasionally pleuritic, sharp. Recent ER workup not suggestive of ACS. We discussed that there may be cardiac and noncardiac causes, but at this time, my suspicion is highest for non-cardiac cause. She did endorse to me improvement with carafate and no improvement with nitroglycerin.  While her symptoms are atypical, women in particular can have atypical symptoms. She lives alone, and these events are very distressing to her. Both she and her daughter wish to pursue further evaluation to determine if there is a cardiac etiology to her pain.  -discussed treadmill stress, nuclear stress/lexiscan, and CT coronary angiography. Discussed pros and cons of each, including but not limited to false positive/false negative risk, radiation risk, and risk of IV contrast dye. Based on shared decision making, decision was made to pursue CT coronary angiography. -will give one time dose of metoprolol -counseled on need to get BMET prior to test -counseled on use of sublingual nitroglycerin and its importance to a good test -I will give her a one time dose of xanax to be taken just prior to study, as high anxiety will increase her heart rate and affect the accuracy of the test. She does not drive, and her daughter will bring her to her scan  Hypertension; BP elevated today, but she is very anxious and intermittently tearful. Prior good control. Will monitor -continue amlodipine, lisinopril  Cardiac risk counseling and prevention recommendations: -recommend heart healthy/Mediterranean diet, with whole grains, fruits, vegetable, fish, lean meats, nuts, and olive oil. Limit salt. -recommend moderate walking, 3-5 times/week for 30-50 minutes each session. Aim for at least 150 minutes.week. Goal should be pace of 3 miles/hours, or walking 1.5 miles in 30 minutes -recommend  avoidance of tobacco products. Avoid excess alcohol. -Additional risk factor control:  -Diabetes: A1c is not available, denies history  -Lipids: checking today-->LDL 121, will evaluate for statin after CT scan  -Blood pressure control: as above  -Weight: BMI 34, encourage good diet/activity/healthy weight -ASCVD risk score: The 10-year ASCVD risk score Mikey Bussing DC Brooke Bonito., et al., 2013) is: 6%   Values used to calculate the score:     Age: 30 years     Sex: Female     Is Non-Hispanic African American: Yes     Diabetic: No  Tobacco smoker: No     Systolic Blood Pressure: [id number] mmHg     Is BP treated: Yes     HDL Cholesterol: 49 mg/dL     Total Cholesterol: 168 mg/dL    Plan for follow up: 3 mos or sooner PRN  Medication Adjustments/Labs and Tests Ordered: Current medicines are reviewed at length with the patient today.  Concerns regarding medicines are outlined above.  Orders Placed This Encounter  Procedures  . CT CORONARY MORPH W/CTA COR W/SCORE W/CA W/CM &/OR WO/CM  . CT CORONARY FRACTIONAL FLOW RESERVE DATA PREP  . CT CORONARY FRACTIONAL FLOW RESERVE FLUID ANALYSIS  . Lipid panel  . Basic metabolic panel  . EKG 12-Lead   Meds ordered this encounter  Medications  . metoprolol tartrate (LOPRESSOR) 100 MG tablet    Sig: TAKE 1 TABLET 2 HR PRIOR TO CARDIAC PROCEDURE    Dispense:  1 tablet    Refill:  0  . ALPRAZolam (XANAX) 0.5 MG tablet    Sig: Take 1 tablet (0.5 mg total) by mouth once for 1 dose. Prior to test    Dispense:  1 tablet    Refill:  0    Patient Instructions  Medication Instructions:  Your Physician recommend you continue on your current medication as directed.    If you need a refill on your cardiac medications before your next appointment, please call your pharmacy.   Lab work: Your physician recommends that you return for lab work today (lipid, BMP)  If you have labs (blood work) drawn today and your tests are completely normal, you will receive your  results only by: Marland Kitchen MyChart Message (if you have MyChart) OR . A paper copy in the mail If you have any lab test that is abnormal or we need to change your treatment, we will call you to review the results.  Testing/Procedures: Your physician has requested that you have cardiac CT. Cardiac computed tomography (CT) is a painless test that uses an x-ray machine to take clear, detailed pictures of your heart. For further information please visit HugeFiesta.tn. Please follow instruction sheet as given. [facility]   Follow-Up: Your physician recommends that you schedule a follow-up appointment in 3 months with Dr. Harrell Gave   Your cardiac CT will be scheduled at one of the below locations:   [facility] [address] [place], [place] [address] [phone]   If scheduled at [facility], please arrive at the [facility] main entrance of [facility] 30-45 minutes prior to test start time. Proceed to the [facility] Radiology Department (first floor) to check-in and test prep.  Please follow these instructions carefully (unless otherwise directed):   On the Night Before the Test: . Be sure to Drink plenty of water. . Do not consume any caffeinated/decaffeinated beverages or chocolate 12 hours prior to your test. . Do not take any antihistamines 12 hours prior to your test. .  On the Day of the Test: . Drink plenty of water. Do not drink any water within one hour of the test. . Do not eat any food 4 hours prior to the test. . You may take your regular medications prior to the test.  . Take metoprolol (Lopressor) two hours prior to test. . Take Ativan 0.5 mg prior to test . FEMALES- please wear underwire-free bra if available       After the Test: . Drink plenty of water. . After receiving IV contrast, you may  experience a mild flushed feeling. This is normal. . On occasion, you may experience a mild rash up to 24 hours after the test.  This is not dangerous. If this occurs, you can take Benadryl 25 mg and increase your fluid intake. . If you experience trouble breathing, this can be serious. If it is severe call 911 IMMEDIATELY. If it is mild, please call our office. . If you take any of these medications: Glipizide/Metformin, Avandament, Glucavance, please do not take 48 hours after completing test unless otherwise instructed.    Please contact the cardiac imaging nurse navigator should you have any questions/concerns Marchia Bond, RN Navigator Cardiac Imaging [facility] [phone] Office  [phone] Cell      Signed, Buford Dresser, MD PhD 01/29/2019 9:34 PM    [place]

## 2019-01-29 NOTE — Patient Instructions (Addendum)
Medication Instructions:  Your Physician recommend you continue on your current medication as directed.    If you need a refill on your cardiac medications before your next appointment, please call your pharmacy.   Lab work: Your physician recommends that you return for lab work today (lipid, BMP)  If you have labs (blood work) drawn today and your tests are completely normal, you will receive your results only by: Marland Kitchen MyChart Message (if you have MyChart) OR . A paper copy in the mail If you have any lab test that is abnormal or we need to change your treatment, we will call you to review the results.  Testing/Procedures: Your physician has requested that you have cardiac CT. Cardiac computed tomography (CT) is a painless test that uses an x-ray machine to take clear, detailed pictures of your heart. For further information please visit HugeFiesta.tn. Please follow instruction sheet as given. [facility]   Follow-Up: Your physician recommends that you schedule a follow-up appointment in 3 months with Dr. Harrell Gave   Your cardiac CT will be scheduled at one of the below locations:   [facility] [address] [place], [place] [address] [phone]   If scheduled at [facility], please arrive at the [facility] main entrance of [facility] 30-45 minutes prior to test start time. Proceed to the [facility] Radiology Department (first floor) to check-in and test prep.  Please follow these instructions carefully (unless otherwise directed):   On the Night Before the Test: . Be sure to Drink plenty of water. . Do not consume any caffeinated/decaffeinated beverages or chocolate 12 hours prior to your test. . Do not take any antihistamines 12 hours prior to your test. .  On the Day of the Test: . Drink plenty of water. Do not drink any water within one hour of the test. . Do not eat any food 4 hours prior to the test. . You may take  your regular medications prior to the test.  . Take metoprolol (Lopressor) two hours prior to test. . Take Ativan 0.5 mg prior to test . FEMALES- please wear underwire-free bra if available       After the Test: . Drink plenty of water. . After receiving IV contrast, you may experience a mild flushed feeling. This is normal. . On occasion, you may experience a mild rash up to 24 hours after the test. This is not dangerous. If this occurs, you can take Benadryl 25 mg and increase your fluid intake. . If you experience trouble breathing, this can be serious. If it is severe call 911 IMMEDIATELY. If it is mild, please call our office. . If you take any of these medications: Glipizide/Metformin, Avandament, Glucavance, please do not take 48 hours after completing test unless otherwise instructed.    Please contact the cardiac imaging nurse navigator should you have any questions/concerns Marchia Bond, RN Navigator Cardiac Imaging [place] [facility] [phone] Office  [phone] Cell

## 2019-01-31 ENCOUNTER — Emergency Department (HOSPITAL_COMMUNITY)
Admission: EM | Admit: 2019-01-31 | Discharge: 2019-02-01 | Disposition: A | Discharge status: Home / Self Care | Payer: Medicare Other | Attending: Emergency Medicine | Admitting: Emergency Medicine

## 2019-01-31 ENCOUNTER — Other Ambulatory Visit: Payer: Self-pay

## 2019-01-31 ENCOUNTER — Emergency Department (HOSPITAL_COMMUNITY): Payer: Medicare Other

## 2019-01-31 ENCOUNTER — Encounter (HOSPITAL_COMMUNITY): Payer: Self-pay | Admitting: Emergency Medicine

## 2019-01-31 DIAGNOSIS — R1111 Vomiting without nausea: Secondary | ICD-10-CM | POA: Diagnosis not present

## 2019-01-31 DIAGNOSIS — I10 Essential (primary) hypertension: Secondary | ICD-10-CM | POA: Diagnosis not present

## 2019-01-31 DIAGNOSIS — R000 Tachycardia, unspecified: Secondary | ICD-10-CM | POA: Diagnosis not present

## 2019-01-31 DIAGNOSIS — R0689 Other abnormalities of breathing: Secondary | ICD-10-CM | POA: Diagnosis not present

## 2019-01-31 DIAGNOSIS — R0789 Other chest pain: Secondary | ICD-10-CM | POA: Diagnosis not present

## 2019-01-31 DIAGNOSIS — Z79899 Other long term (current) drug therapy: Secondary | ICD-10-CM | POA: Insufficient documentation

## 2019-01-31 DIAGNOSIS — R079 Chest pain, unspecified: Secondary | ICD-10-CM | POA: Diagnosis not present

## 2019-01-31 LAB — CBC
HCT: 38.6 % (ref 36.0–46.0)
Hemoglobin: 12.3 g/dL (ref 12.0–15.0)
MCH: 28.6 pg (ref 26.0–34.0)
MCHC: 31.9 g/dL (ref 30.0–36.0)
MCV: 89.8 fL (ref 80.0–100.0)
Platelets: 234 10*3/uL (ref 150–400)
RBC: 4.3 MIL/uL (ref 3.87–5.11)
RDW: 12.8 % (ref 11.5–15.5)
WBC: 8.1 10*3/uL (ref 4.0–10.5)
nRBC: 0 % (ref 0.0–0.2)

## 2019-01-31 LAB — POC URINE PREG, ED: Preg Test, Ur: NEGATIVE

## 2019-01-31 LAB — BASIC METABOLIC PANEL
Anion gap: 8 (ref 5–15)
BUN: 12 mg/dL (ref 6–20)
CO2: 26 mmol/L (ref 22–32)
Calcium: 9.6 mg/dL (ref 8.9–10.3)
Chloride: 104 mmol/L (ref 98–111)
Creatinine, Ser: 0.72 mg/dL (ref 0.44–1.00)
GFR calc Af Amer: >60 mL/min (ref >60)
GFR calc non Af Amer: >60 mL/min (ref >60)
Glucose, Bld: 114 mg/dL — ABNORMAL HIGH (ref 70–99)
Potassium: 3.4 mmol/L — ABNORMAL LOW (ref 3.5–5.1)
Sodium: 138 mmol/L (ref 135–145)

## 2019-01-31 LAB — TROPONIN I (HIGH SENSITIVITY): Troponin I (High Sensitivity): 5 ng/L (ref <18)

## 2019-01-31 MED ORDER — SODIUM CHLORIDE 0.9% FLUSH
3.0000 mL | Freq: Once | INTRAVENOUS | Status: AC
  Administered 2019-01-31: 3 mL via INTRAVENOUS

## 2019-01-31 MED ORDER — CYCLOBENZAPRINE HCL 10 MG PO TABS
10.0000 mg | ORAL_TABLET | Freq: Once | ORAL | Status: AC
  Administered 2019-02-01: 10 mg via ORAL
  Filled 2019-01-31: qty 1

## 2019-01-31 MED ORDER — NAPROXEN 250 MG PO TABS
500.0000 mg | ORAL_TABLET | Freq: Once | ORAL | Status: AC
  Administered 2019-02-01: 500 mg via ORAL
  Filled 2019-01-31: qty 2

## 2019-01-31 NOTE — ED Triage Notes (Signed)
C/o chest pain (recurrent since middle of August). Pt has since followed up with cardiologist. Per family, cardiologist thinks it is GI related and needs to follow-up with them. Pt states she has an appointment next month. Per EMS, pt vomited once at home and c/o centralized chest pain that is constant and pt rates pain 9/10.

## 2019-02-01 DIAGNOSIS — R0789 Other chest pain: Secondary | ICD-10-CM | POA: Diagnosis not present

## 2019-02-01 LAB — TROPONIN I (HIGH SENSITIVITY): Troponin I (High Sensitivity): 7 ng/L (ref <18)

## 2019-02-01 MED ORDER — NAPROXEN SODIUM 550 MG PO TABS: 550.0000 mg | ORAL_TABLET | Freq: Two times a day (BID) | ORAL | 0 refills | Status: DC

## 2019-02-01 MED ORDER — CYCLOBENZAPRINE HCL 5 MG PO TABS: 5.0000 mg | ORAL_TABLET | Freq: Three times a day (TID) | ORAL | 0 refills | Status: DC | PRN

## 2019-02-01 NOTE — ED Provider Notes (Signed)
[facility] EMERGENCY DEPARTMENT Provider Note   CSN: [id number] Arrival date & time: 01/31/19  2232   Time seen 11:35 PM  History   Chief Complaint Chief Complaint  Patient presents with  . Chest Pain    HPI Brittany Durham is a 48 y.o. female.     HPI patient presents with a family member.  She states she has been having chest pain since August 25.  She has been evaluated by cardiology and actually had a visit about 2 or 3 days ago.  They are going to schedule her with what sounds like a CTA to look for calcifications of her coronary arteries.  She also has a appointment in October with Dr. Gala Romney, gastroenterology.  She states today the pain woke her up from sleep, she does not know what time but it was still dark outside.  The pain is in the center of her chest and is constant and described as sharp.  Any type of movement or deep breathing makes the pain worse.  Nothing makes it feel better.  She states she was prescribed Carafate last month and she took it today without relief.  She denies fever, sore throat, rhinorrhea, dysphasia.  She has a mild cough that is nonproductive.  She did have some nausea and vomited once today.  She denies any change in her activity or change in her diet.  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  She states she used to have this chest pain years ago however it had been gone for many years and now it is returning.  She denies any family history of coronary artery disease.  Patient has stuttering however she is able to give adequate history.  PCP Sharion Balloon, FNP GI Dr Gala Romney   Past Medical History:  Diagnosis Date  . Carotidynia   . DDD (degenerative disc disease), cervical   . Hypertension   . Knee pain    left meniscus tear s/p repair 11/2014  . Seizures ([place])   . Stuttering    patient reports this started after her knee surgery in 11/2014.   Marland Kitchen  Thyroid nodule     Patient Active Problem List   Diagnosis Date Noted  . Chest pain 12/31/2018  . Osteoarthritis 11/18/2017  . Insomnia 11/18/2017  . 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 Tear of lateral meniscus of right knee, current 10/28/2017  . 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 Language difficulty 09/25/2016  . Nausea with vomiting 07/18/2016  . PUD (peptic ulcer disease) 07/18/2016  . Slurred speech   . GI bleed 05/02/2016  . Upper GI bleed 05/02/2016  . Sinus tachycardia 05/02/2016  . Acute blood loss anemia 05/02/2016  . Abdominal pain, epigastric 05/02/2016  . 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 Thyroid nodule 12/14/2014  . Left knee pain 12/14/2014  . Seizures ([place])   . Altered mental status 12/12/2014  . Vitamin D deficiency 06/30/2014  . GERD (gastroesophageal reflux disease) 06/29/2014  . SYNCOPE 03/02/2010  . HYPERTENSION, BENIGN 03/01/2010    Past Surgical History:  Procedure Laterality Date  . COLONOSCOPY N/A 08/23/2016   Procedure: COLONOSCOPY;  Surgeon: Daneil Dolin, MD;  Location: [facility];  Service: Endoscopy;  Laterality: N/A;  1:00pm  . ESOPHAGOGASTRODUODENOSCOPY N/A 05/03/2016   Procedure: ESOPHAGOGASTRODUODENOSCOPY (EGD);  Surgeon: Daneil Dolin, MD;  Location: [facility];  Service: Endoscopy;  Laterality:  N/A;  . ESOPHAGOGASTRODUODENOSCOPY N/A 08/23/2016   Procedure: ESOPHAGOGASTRODUODENOSCOPY (EGD);  Surgeon: Daneil Dolin, MD;  Location: [facility];  Service: Endoscopy;  Laterality: N/A;  . KNEE ARTHROSCOPY WITH MEDIAL MENISECTOMY Left 12/09/2014   Procedure: KNEE ARTHROSCOPY WITH PARTIAL MEDIAL AND LATERAL MENISECTOMY;  Surgeon: Sanjuana Kava, MD;  Location: [facility] ORS;  Service: Orthopedics;  Laterality: Left;  . TUBAL LIGATION    . WISDOM TOOTH EXTRACTION       OB History    Gravida  1   Para  1   Term  1   Preterm      AB      Living  1     SAB      TAB      Ectopic      Multiple      Live  Births               Home Medications    Prior to Admission medications   Medication Sig Start Date End Date Taking? Authorizing Provider  Acetaminophen (TYLENOL PO) Take by mouth.    [provider]  amLODipine (NORVASC) 10 MG tablet Take 1 tablet (10 mg total) by mouth daily. 09/17/18   Sharion Balloon, FNP  cyclobenzaprine (FLEXERIL) 5 MG tablet Take 1 tablet (5 mg total) by mouth 3 (three) times daily as needed. 02/01/19   Rolland Porter, MD  gabapentin (NEURONTIN) 300 MG capsule Take 1 capsule (300 mg total) by mouth 3 (three) times daily. Patient not taking: Reported on 01/29/2019 08/05/18   Evelina Dun A, FNP  lisinopril (ZESTRIL) 20 MG tablet TAKE 1 TABLET DAILY 01/09/19   Evelina Dun A, FNP  metoprolol tartrate (LOPRESSOR) 100 MG tablet TAKE 1 TABLET 2 HR PRIOR TO CARDIAC PROCEDURE 01/29/19   Buford Dresser, MD  naproxen sodium (ANAPROX) 550 MG tablet Take 1 tablet (550 mg total) by mouth 2 (two) times daily with a meal. 02/01/19   Rolland Porter, MD  pantoprazole (PROTONIX) 40 MG tablet Take 1 tablet (40 mg total) by mouth daily before breakfast. 09/17/18   Sharion Balloon, FNP  sucralfate (CARAFATE) 1 GM/10ML suspension Take 10 mLs (1 g total) by mouth 4 (four) times daily as needed (chest pain/reflux). 12/31/18   Carlis Stable, NP  Vitamin D, Ergocalciferol, (DRISDOL) 1.25 MG (50000 UT) CAPS capsule TAKE 1 CAPSULE ONCE A WEEK 09/17/18   Sharion Balloon, FNP    Family History Family History  Problem Relation Age of Onset  . Hypertension Mother   . Hypertension Brother   . Colon cancer Neg Hx     Social History Social History   Tobacco Use  . Smoking status: Never Smoker  . Smokeless tobacco: Never Used  Substance Use Topics  . Alcohol use: No  . Drug use: No     Allergies   Patient has no known allergies.   Review of Systems Review of Systems  All other systems reviewed and are negative.    Physical Exam Updated Vital Signs BP 125/75   Pulse 88    Temp 98.3 F (36.8 C) (Oral)   Resp (!) 8   Ht 5\' 7"  (1.702 m)   Wt 99.8 kg   SpO2 97%   BMI 34.46 kg/m   Physical Exam Vitals signs and nursing note reviewed.  Constitutional:      General: She is not in acute distress.    Appearance: Normal appearance. She is well-developed. She is not ill-appearing or toxic-appearing.  HENT:     Head: Normocephalic and atraumatic.     Right Ear: External ear normal.     Left Ear: External ear normal.     Nose: Nose normal. No mucosal edema or rhinorrhea.     Mouth/Throat:     Mouth: Mucous membranes are dry.     Dentition: No dental abscesses.     Pharynx: No uvula swelling.  Eyes:     Extraocular Movements: Extraocular movements intact.     Conjunctiva/sclera: Conjunctivae normal.     Pupils: Pupils are equal, round, and reactive to light.  Neck:     Musculoskeletal: Full passive range of motion without pain, normal range of motion and neck supple.  Cardiovascular:     Rate and Rhythm: Normal rate and regular rhythm.     Heart sounds: Normal heart sounds. No murmur. No friction rub. No gallop.   Pulmonary:     Effort: Pulmonary effort is normal. No respiratory distress.     Breath sounds: Normal breath sounds. No wheezing, rhonchi or rales.  Chest:     Chest wall: Tenderness present. No crepitus.       Comments: Patient is extremely tender even to light touch to palpation of her central chest, even the stethoscope elicited pain.  When I palpated her chest she started crying. Abdominal:     General: Bowel sounds are normal. There is no distension.     Palpations: Abdomen is soft.     Tenderness: There is no abdominal tenderness. There is no guarding or rebound.  Musculoskeletal: Normal range of motion.        General: No tenderness.     Comments: Moves all extremities well.   Skin:    General: Skin is warm and dry.     Coloration: Skin is not pale.     Findings: No erythema or rash.  Neurological:     General: No focal  deficit present.     Mental Status: She is alert and oriented to person, place, and time.     Cranial Nerves: No cranial nerve deficit.     Comments: Patient stutters  Psychiatric:        Mood and Affect: Mood normal. Mood is not anxious.        Speech: Speech normal.        Behavior: Behavior normal.        Thought Content: Thought content normal.      ED Treatments / Results  Labs (all labs ordered are listed, but only abnormal results are displayed) Results for orders placed or performed during the hospital encounter of [id number]  Basic metabolic panel  Result Value Ref Range   Sodium 138 135 - 145 mmol/L   Potassium 3.4 (L) 3.5 - 5.1 mmol/L   Chloride 104 98 - 111 mmol/L   CO2 26 22 - 32 mmol/L   Glucose, Bld 114 (H) 70 - 99 mg/dL   BUN 12 6 - 20 mg/dL   Creatinine, Ser 0.72 0.44 - 1.00 mg/dL   Calcium 9.6 8.9 - 10.3 mg/dL   GFR calc non Af Amer >60 >60 mL/min   GFR calc Af Amer >60 >60 mL/min   Anion gap 8 5 - 15  CBC  Result Value Ref Range   WBC 8.1 4.0 - 10.5 K/uL   RBC 4.30 3.87 - 5.11 MIL/uL   Hemoglobin 12.3 12.0 - 15.0 g/dL   HCT 38.6 36.0 - 46.0 %   MCV 89.8 80.0 - 100.0 fL  MCH 28.6 26.0 - 34.0 pg   MCHC 31.9 30.0 - 36.0 g/dL   RDW 12.8 11.5 - 15.5 %   Platelets 234 150 - 400 K/uL   nRBC 0.0 0.0 - 0.2 %  POC urine preg, ED  Result Value Ref Range   Preg Test, Ur NEGATIVE NEGATIVE  Troponin I (High Sensitivity)  Result Value Ref Range   Troponin I (High Sensitivity) 5 <18 ng/L  Troponin I (High Sensitivity)  Result Value Ref Range   Troponin I (High Sensitivity) 7 <18 ng/L   Laboratory interpretation all normal including negative delta troponin    EKG EKG Interpretation  Date/Time:  Saturday January 31 2019 22:52:55 EDT Ventricular Rate:  114 PR Interval:    QRS Duration: 86 QT Interval:  319 QTC Calculation: 440 R Axis:   61 Text Interpretation:  Sinus tachycardia since last tracing no significant change Confirmed by Daleen Bo  [phone]) on 01/31/2019 11:07:56 PM   Radiology Dg Chest 2 View  Result Date: 01/31/2019 CLINICAL DATA:  Chest pain EXAM: CHEST - 2 VIEW COMPARISON:  January 06, 2019 FINDINGS: The heart size and mediastinal contours are within normal limits. Both lungs are clear. The visualized skeletal structures are unremarkable. IMPRESSION: No active cardiopulmonary disease. Electronically Signed   By: Prudencio Pair M.D.   On: 01/31/2019 23:12    Procedures Procedures (including critical care time)  Medications Ordered in ED Medications  sodium chloride flush (NS) 0.9 % injection 3 mL (3 mLs Intravenous Given 01/31/19 2258)  cyclobenzaprine (FLEXERIL) tablet 10 mg (10 mg Oral Given 02/01/19 0000)  naproxen (NAPROSYN) tablet 500 mg (500 mg Oral Given 02/01/19 0000)     Initial Impression / Assessment and Plan / ED Course  I have reviewed the triage vital signs and the nursing notes.  Pertinent labs & imaging results that were available during my care of the patient were reviewed by me and considered in my medical decision making (see chart for details).       I reviewed patient's last endoscopy report from April 2018 and there is no inflammation or ulcers seen.  She was given oral Flexeril and naproxen for her apparent chest wall pain.  We discussed her initial lab work was all normal, she will be waiting for a delta troponin to result.  I would be very surprised if it is positive.  I had a discussion with the patient and her family member in great detail about chest wall pain.  Patient was given a heat pack for her chest pain.  Final Clinical Impressions(s) / ED Diagnoses   Final diagnoses:  Chest wall pain    ED Discharge Orders         Ordered    cyclobenzaprine (FLEXERIL) 5 MG tablet  3 times daily PRN     02/01/19 0216    naproxen sodium (ANAPROX) 550 MG tablet  2 times daily with meals     02/01/19 0217         Plan discharge  Rolland Porter, MD, Barbette Or, MD 02/01/19 [phone]

## 2019-02-01 NOTE — Discharge Instructions (Signed)
Use ice and heat on your chest for comfort.  You can take the naproxen as prescribed OR Aleve 2 tablets over-the-counter twice a day.  Take the muscle relaxer if needed for your chest pain when it is extremely sore.  Recheck if you get a fever, cough, or struggle to breathe.  This is a benign condition, this does not mean you have any serious underlying heart or lung problem.  It is an aggravation because it is painful.  Try not to do any physical activity that makes your chest hurt more.

## 2019-02-01 NOTE — ED Notes (Signed)
Patient complained of chest pain mid aching/burning. Notified Dr. Tomi Bamberger order for heat pack, implemented

## 2019-02-02 ENCOUNTER — Encounter: Payer: Self-pay | Admitting: Cardiology

## 2019-02-03 ENCOUNTER — Other Ambulatory Visit: Payer: Self-pay | Admitting: Neurological Surgery

## 2019-02-05 DIAGNOSIS — M722 Plantar fascial fibromatosis: Secondary | ICD-10-CM | POA: Diagnosis not present

## 2019-02-05 DIAGNOSIS — M79672 Pain in left foot: Secondary | ICD-10-CM | POA: Diagnosis not present

## 2019-02-22 DIAGNOSIS — H1032 Unspecified acute conjunctivitis, left eye: Secondary | ICD-10-CM | POA: Diagnosis not present

## 2019-03-05 ENCOUNTER — Other Ambulatory Visit: Payer: Self-pay

## 2019-03-05 ENCOUNTER — Encounter: Payer: Self-pay | Admitting: Nurse Practitioner

## 2019-03-05 ENCOUNTER — Ambulatory Visit (INDEPENDENT_AMBULATORY_CARE_PROVIDER_SITE_OTHER): Payer: Medicare Other | Admitting: Nurse Practitioner

## 2019-03-05 ENCOUNTER — Telehealth (HOSPITAL_COMMUNITY): Payer: Self-pay | Admitting: Emergency Medicine

## 2019-03-05 VITALS — BP 154/100 | HR 95 | Temp 96.8°F | Ht 67.0 in | Wt 221.4 lb

## 2019-03-05 DIAGNOSIS — K219 Gastro-esophageal reflux disease without esophagitis: Secondary | ICD-10-CM

## 2019-03-05 DIAGNOSIS — R1013 Epigastric pain: Secondary | ICD-10-CM | POA: Diagnosis not present

## 2019-03-05 NOTE — Assessment & Plan Note (Signed)
Epigastric pain is gone today.  She was previously having epigastric burning pain for which she went to the ER twice.  Essentially diagnosed with chest wall pain or atypical chest pain.  Likely related to GERD.  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  She was going to stop taking this immediately when she found out it was an NSAID.  Continue PPI, call for any worsening symptoms.  Avoid all NSAIDs in the future.  Follow-up in 6 months.

## 2019-03-05 NOTE — Assessment & Plan Note (Signed)
GERD symptoms have increased somewhat.  Interestingly, she was placed on naproxen by the ER for chest wall pain.  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  She was unaware that naproxen was an NSAID and states she will stop taking it immediately because she does not want another ulcer.  I feel her mild recent flare and GERD will probably improve when she stops her NSAID.  Recommend she continue her PPI daily, follow-up in 6 months.

## 2019-03-05 NOTE — Progress Notes (Signed)
Referring Provider: Sharion Balloon, FNP Primary Care Physician:  Sharion Balloon, FNP Primary GI:  Dr. Gala Romney  Chief Complaint  Patient presents with   Gastroesophageal Reflux    HPI:   Brittany Durham is a 48 y.o. female who presents for follow-up.  The patient was last seen in our office 12/31/2018 for GERD, chest pain, peptic ulcer disease.  Colonoscopy on file next due in 2028.  History of NSAID induced peptic ulcer disease that was documented is healed on follow-up EGD.  Protonix 40 mg daily generally controlled her GERD symptoms.  She been advised to avoid all NSAIDs.  At her last visit she complained of "chest pain" at 7 PM every night for the past 3 days associated with some dyspnea.  She has not notified her primary care.  Typically her last meal is at 630 to 7:00 PM.  Symptoms typically occur if she is lying down.  Currently on Protonix once daily and denies NSAIDs or aspirin powders.  Recommended continue PPI, Carafate as needed, remain upright for at least 3 to 4 hours after last meal the day, call primary care provider today or tomorrow and request to be seen for chest pain with shortness of breath, follow-up in 2 months.  Of note the patient has been seen in the emergency department 01/05/2019 and 01/31/2019 for chest pain.  First visit deemed atypical chest pain with normal cardiac work-up.  She improved with Toradol, Ativan, fluids and was discharged home satisfactorily.  Most recent ER visit 01/31/2019 for chest wall pain.  She again ruled out for cardiac etiology.  Overall deemed chest wall pain and she was given naproxen and Flexeril at discharge.  Today she states she's doing ok overall. She is still taking Naproxen that was prescribed by the ED. She was unaware that it was an NSAID. Has started having worsening GERD recently. Still on Protonix once daily. No other NSAIDs or ASA powders. Denies abdominal pain, N/V, hematochezia, melena, fever, chills, unintentional weight loss.  Denies URI or flu-like symptoms. Denies loss of sense of taste or smell. Denies chest pain, dyspnea, dizziness, lightheadedness, syncope, near syncope. Denies any other upper or lower GI symptoms.  Past Medical History:  Diagnosis Date   Carotidynia    DDD (degenerative disc disease), cervical    Hypertension    Knee pain    left meniscus tear s/p repair 11/2014   Seizures ([place])    Stuttering    patient reports this started after her knee surgery in 11/2014.    Thyroid nodule     Past Surgical History:  Procedure Laterality Date   COLONOSCOPY N/A 08/23/2016   Procedure: COLONOSCOPY;  Surgeon: Daneil Dolin, MD;  [facility];  Service: Endoscopy;  Laterality: N/A;  1:00pm   ESOPHAGOGASTRODUODENOSCOPY N/A 05/03/2016   Procedure: ESOPHAGOGASTRODUODENOSCOPY (EGD);  Surgeon: Daneil Dolin, MD;  [facility];  Service: Endoscopy;  Laterality: N/A;   ESOPHAGOGASTRODUODENOSCOPY N/A 08/23/2016   Procedure: ESOPHAGOGASTRODUODENOSCOPY (EGD);  Surgeon: Daneil Dolin, MD;  [facility];  Service: Endoscopy;  Laterality: N/A;   KNEE ARTHROSCOPY WITH MEDIAL MENISECTOMY Left 12/09/2014   Procedure: KNEE ARTHROSCOPY WITH PARTIAL MEDIAL AND LATERAL MENISECTOMY;  Surgeon: Sanjuana Kava, MD;  Location: [facility];  Service: Orthopedics;  Laterality: Left;   TUBAL LIGATION     WISDOM TOOTH EXTRACTION      Current Outpatient Medications  Medication Sig Dispense Refill   Acetaminophen (TYLENOL PO) Take by mouth as needed.  amLODipine (NORVASC) 10 MG tablet Take 1 tablet (10 mg total) by mouth daily. 90 tablet 1   cyclobenzaprine (FLEXERIL) 5 MG tablet Take 1 tablet (5 mg total) by mouth 3 (three) times daily as needed. 30 tablet 0   lisinopril (ZESTRIL) 20 MG tablet TAKE 1 TABLET DAILY 90 tablet 0   metoprolol tartrate (LOPRESSOR) 100 MG tablet TAKE 1 TABLET 2 HR PRIOR TO CARDIAC PROCEDURE 1 tablet 0   naproxen sodium (ANAPROX) 550 MG tablet Take 1  tablet (550 mg total) by mouth 2 (two) times daily with a meal. 20 tablet 0   pantoprazole (PROTONIX) 40 MG tablet Take 1 tablet (40 mg total) by mouth daily before breakfast. 30 tablet 11   sucralfate (CARAFATE) 1 GM/10ML suspension Take 10 mLs (1 g total) by mouth 4 (four) times daily as needed (chest pain/reflux). 420 mL 1   Vitamin D, Ergocalciferol, (DRISDOL) 1.25 MG (50000 UT) CAPS capsule TAKE 1 CAPSULE ONCE A WEEK 12 capsule 3   No current facility-administered medications for this visit.     Allergies as of 03/05/2019   (No Known Allergies)    Family History  Problem Relation Age of Onset   Hypertension Mother    Lung cancer Father    Hypertension Brother    Colon cancer Neg Hx    Gastric cancer Neg Hx    Esophageal cancer Neg Hx     Social History   Socioeconomic History   Marital status: Single    Spouse name: Not on file   Number of children: 1   Years of education: 12   Highest education level: Not on file  Occupational History   Occupation: [employer]  Scientist, product/process development strain: Not hard at all   Food insecurity    Worry: Never true    Inability: Never true   Transportation needs    Medical: No    Non-medical: No  Tobacco Use   Smoking status: Never Smoker   Smokeless tobacco: Never Used  Substance and Sexual Activity   Alcohol use: No   Drug use: No   Sexual activity: Not Currently    Birth control/protection: Surgical  Lifestyle   Physical activity    Days per week: 0 days    Minutes per session: 0 min   Stress: Not at all  Relationships   Social connections    Talks on phone: More than three times a week    Gets together: More than three times a week    Attends religious service: Not on file    Active member of club or organization: No    Attends meetings of clubs or organizations: Never    Relationship status: Not on file  Other Topics Concern   Not on file  Social History Narrative   Lives at  home alone.   Right-handed.   Occasional caffeine use.    Review of Systems: General: Negative for anorexia, weight loss, fever, chills, fatigue, weakness. ENT: Negative for hoarseness, difficulty swallowing. CV: Negative for chest pain, angina, palpitations, peripheral edema.  Respiratory: Negative for dyspnea at rest, cough, sputum, wheezing.  GI: See history of present illness. MS: Admits chronic neck pain, surgery next month.  Derm: Negative for rash or itching.  Endo: Negative for unusual weight change.  Heme: Negative for bruising or bleeding. Allergy: Negative for rash or hives.   Physical Exam: BP (!) 154/100    Pulse 95    Temp (!) 96.8 F (36 C) (Temporal)  Ht 5\' 7"  (1.702 m)    Wt 221 lb 6.4 oz (100.4 kg)    BMI 34.68 kg/m  General:   Alert and oriented. Pleasant and cooperative. Well-nourished and well-developed.  Eyes:  Without icterus, sclera clear and conjunctiva pink.  Ears:  Normal auditory acuity. Cardiovascular:  S1, S2 present without murmurs appreciated. Extremities without clubbing or edema. Respiratory:  Clear to auscultation bilaterally. No wheezes, rales, or rhonchi. No distress.  Gastrointestinal:  +BS, soft, non-tender and non-distended. No HSM noted. No guarding or rebound. No masses appreciated.  Rectal:  Deferred  Musculoskalatal:  Symmetrical without gross deformities. Neurologic:  Alert and oriented x4;  grossly normal neurologically. Psych:  Alert and cooperative. Normal mood and affect. Heme/Lymph/Immune: No excessive bruising noted.    03/05/2019 11:16 AM   Disclaimer: This note was dictated with voice recognition software. Similar sounding words can inadvertently be transcribed and may not be corrected upon review.

## 2019-03-05 NOTE — Patient Instructions (Signed)
Your health issues we discussed today were:   GERD (reflux/heartburn): 1. Stop taking naproxen that the ER gave you 2. Avoid all NSAIDs which includes ibuprofen, Motrin, Advil, Aleve, Naprosyn, naproxen, any medication with "NSAID" on the bottle 3. Call us if you have any worsening or severe symptoms  Overall I recommend:  1. Continue your current medications 2. Return for follow-up in 6 months 3. Call us if you have any questions or concerns.   Because of recent events of COVID-19 ("Coronavirus"), follow CDC recommendations:  1. Wash your hand frequently 2. Avoid touching your face 3. Stay away from people who are sick 4. If you have symptoms such as fever, cough, shortness of breath then call your healthcare provider for further guidance 5. If you are sick, STAY AT HOME unless otherwise directed by your healthcare provider. 6. Follow directions from state and national officials regarding staying safe   At [facility] we value your feedback. You may receive a survey about your visit today. Please share your experience as we strive to create trusting relationships with our patients to provide genuine, compassionate, quality care.  We appreciate your understanding and patience as we review any laboratory studies, imaging, and other diagnostic tests that are ordered as we care for you. Our office policy is 5 business days for review of these results, and any emergent or urgent results are addressed in a timely manner for your best interest. If you do not hear from our office in 1 week, please contact us.   We also encourage the use of MyChart, which contains your medical information for your review as well. If you are not enrolled in this feature, an access code is on this after visit summary for your convenience. Thank you for allowing [place] to be involved in your care.  It was great to see you today!  I hope you have a great Fall!!

## 2019-03-05 NOTE — Progress Notes (Signed)
cc'ed to pcp °

## 2019-03-05 NOTE — Telephone Encounter (Signed)
Reaching out to patient to offer assistance regarding upcoming cardiac imaging study; pt verbalizes understanding of appt date/time, parking situation and where to check in, pre-test NPO status and medications ordered, and verified current allergies; name and call back number provided for further questions should they arise Marchia Bond RN Navigator Cardiac [facility] [place] [facility] [phone] office [phone] cell  Pt given one time dose xanax for CTA appt, told to take 30 mins prior to appt. Daughter driving to/from appt; verbalized understanding

## 2019-03-06 ENCOUNTER — Ambulatory Visit (HOSPITAL_COMMUNITY)
Admission: RE | Admit: 2019-03-06 | Discharge: 2019-03-06 | Disposition: A | Discharge status: Home / Self Care | Payer: Medicare Other | Source: Ambulatory Visit | Attending: Cardiology | Admitting: Cardiology

## 2019-03-06 ENCOUNTER — Other Ambulatory Visit: Payer: Self-pay

## 2019-03-06 DIAGNOSIS — R072 Precordial pain: Secondary | ICD-10-CM | POA: Diagnosis not present

## 2019-03-06 MED ORDER — METOPROLOL TARTRATE 5 MG/5ML IV SOLN
INTRAVENOUS | Status: AC
  Administered 2019-03-06: 2.5 mg via INTRAVENOUS
  Filled 2019-03-06: qty 20

## 2019-03-06 MED ORDER — METOPROLOL TARTRATE 5 MG/5ML IV SOLN
5.0000 mg | INTRAVENOUS | Status: DC | PRN
  Administered 2019-03-06 (×2): 2.5 mg via INTRAVENOUS
  Administered 2019-03-06 (×2): 5 mg via INTRAVENOUS

## 2019-03-06 MED ORDER — NITROGLYCERIN 0.4 MG SL SUBL: 0.8000 mg | SUBLINGUAL_TABLET | SUBLINGUAL | Status: DC | PRN

## 2019-03-06 MED ORDER — NITROGLYCERIN 0.4 MG SL SUBL
SUBLINGUAL_TABLET | SUBLINGUAL | Status: AC
  Filled 2019-03-06: qty 2

## 2019-03-06 NOTE — Progress Notes (Signed)
Unable to safely lower HR without dropping BP; 500cc NS given; pt denies dizziness or lightheadedness. Decision made to cancel test by Buford Dresser, MD. Pt ambulated to lobby with steady gait. Refreshments offered.

## 2019-03-16 NOTE — Pre-Procedure Instructions (Signed)
[place], [place] [place] [address] Phone: [phone] Fax: [phone]      Your procedure is scheduled on Friday November 6th.  Report to [facility] Main Entrance "A" at 9:15 A.M., and check in at the Admitting office.  Call this number if you have problems the morning of surgery:  [phone]  Call [phone] if you have any questions prior to your surgery date Monday-Friday 8am-4pm    Remember:  Do not eat or drink after midnight the night before your surgery     Take these medicines the morning of surgery with A SIP OF WATER  amLODipine (NORVASC) acetaminophen (TYLENOL) pantoprazole (PROTONIX)   7 days prior to surgery STOP taking any Aspirin (unless otherwise instructed by your surgeon), Aleve, Naproxen, Ibuprofen, Motrin, Advil, Goody's, BC's, all herbal medications, fish oil, and all vitamins.    The Morning of Surgery  Do not wear jewelry, make-up or nail polish.  Do not wear lotions, powders, or perfumes/colognes, or deodorant  Do not shave 48 hours prior to surgery.  Men may shave face and neck.  Do not bring valuables to the hospital.  [facility] is not responsible for any belongings or valuables.  If you are a smoker, DO NOT Smoke 24 hours prior to surgery IF you wear a CPAP at night please bring your mask, tubing, and machine the morning of surgery   Remember that you must have someone to transport you home after your surgery, and remain with you for 24 hours if you are discharged the same day.   Contacts, glasses, hearing aids, dentures or bridgework may not be worn into surgery.    Leave your suitcase in the car.  After surgery it may be brought to your room.  For patients admitted to the hospital, discharge time will be determined by your treatment team.  Patients discharged the day of surgery will not be allowed to drive home.    Special instructions:   [place]- Preparing For  Surgery  Before surgery, you can play an important role. Because skin is not sterile, your skin needs to be as free of germs as possible. You can reduce the number of germs on your skin by washing with CHG (chlorahexidine gluconate) Soap before surgery.  CHG is an antiseptic cleaner which kills germs and bonds with the skin to continue killing germs even after washing.    Oral Hygiene is also important to reduce your risk of infection.  Remember - BRUSH YOUR TEETH THE MORNING OF SURGERY WITH YOUR REGULAR TOOTHPASTE  Please do not use if you have an allergy to CHG or antibacterial soaps. If your skin becomes reddened/irritated stop using the CHG.  Do not shave (including legs and underarms) for at least 48 hours prior to first CHG shower. It is OK to shave your face.  Please follow these instructions carefully.   1. Shower the NIGHT BEFORE SURGERY and the MORNING OF SURGERY with CHG Soap.   2. If you chose to wash your hair, wash your hair first as usual with your normal shampoo.  3. After you shampoo, rinse your hair and body thoroughly to remove the shampoo.  4. Use CHG as you would any other liquid soap. You can apply CHG directly to the skin and wash gently with a scrungie or a clean washcloth.   5. Apply the CHG Soap to your body ONLY FROM THE NECK DOWN.  Do not use on  open wounds or open sores. Avoid contact with your eyes, ears, mouth and genitals (private parts). Wash Face and genitals (private parts)  with your normal soap.   6. Wash thoroughly, paying special attention to the area where your surgery will be performed.  7. Thoroughly rinse your body with warm water from the neck down.  8. DO NOT shower/wash with your normal soap after using and rinsing off the CHG Soap.  9. Pat yourself dry with a CLEAN TOWEL.  10. Wear CLEAN PAJAMAS to bed the night before surgery, wear comfortable clothes the morning of surgery  11. Place CLEAN SHEETS on your bed the night of your first  shower and DO NOT SLEEP WITH PETS.    Day of Surgery:  Do not apply any deodorants/lotions. Please shower the morning of surgery with the CHG soap  Please wear clean clothes to the hospital/surgery center.   Remember to brush your teeth WITH YOUR REGULAR TOOTHPASTE.   Please read over the following fact sheets that you were given.

## 2019-03-17 ENCOUNTER — Other Ambulatory Visit (HOSPITAL_COMMUNITY)
Admission: RE | Admit: 2019-03-17 | Discharge: 2019-03-17 | Disposition: A | Discharge status: Home / Self Care | Payer: Medicare Other | Source: Ambulatory Visit | Attending: Neurological Surgery | Admitting: Neurological Surgery

## 2019-03-17 ENCOUNTER — Encounter (HOSPITAL_COMMUNITY): Payer: Self-pay

## 2019-03-17 ENCOUNTER — Encounter (HOSPITAL_COMMUNITY)
Admission: RE | Admit: 2019-03-17 | Discharge: 2019-03-17 | Disposition: A | Discharge status: Home / Self Care | Payer: Medicare Other | Source: Ambulatory Visit | Attending: Neurological Surgery | Admitting: Neurological Surgery

## 2019-03-17 ENCOUNTER — Telehealth: Payer: Self-pay

## 2019-03-17 ENCOUNTER — Other Ambulatory Visit: Payer: Self-pay

## 2019-03-17 DIAGNOSIS — Z7901 Long term (current) use of anticoagulants: Secondary | ICD-10-CM | POA: Diagnosis not present

## 2019-03-17 DIAGNOSIS — Z79899 Other long term (current) drug therapy: Secondary | ICD-10-CM | POA: Insufficient documentation

## 2019-03-17 DIAGNOSIS — R072 Precordial pain: Secondary | ICD-10-CM

## 2019-03-17 DIAGNOSIS — Z01812 Encounter for preprocedural laboratory examination: Secondary | ICD-10-CM | POA: Insufficient documentation

## 2019-03-17 DIAGNOSIS — M5030 Other cervical disc degeneration, unspecified cervical region: Secondary | ICD-10-CM | POA: Insufficient documentation

## 2019-03-17 DIAGNOSIS — I10 Essential (primary) hypertension: Secondary | ICD-10-CM | POA: Insufficient documentation

## 2019-03-17 HISTORY — DX: Unspecified asthma, uncomplicated: J45.909

## 2019-03-17 LAB — BASIC METABOLIC PANEL
Anion gap: 11 (ref 5–15)
BUN: 11 mg/dL (ref 6–20)
CO2: 26 mmol/L (ref 22–32)
Calcium: 10.1 mg/dL (ref 8.9–10.3)
Chloride: 103 mmol/L (ref 98–111)
Creatinine, Ser: 0.83 mg/dL (ref 0.44–1.00)
GFR calc Af Amer: >60 mL/min (ref >60)
GFR calc non Af Amer: >60 mL/min (ref >60)
Glucose, Bld: 101 mg/dL — ABNORMAL HIGH (ref 70–99)
Potassium: 4.1 mmol/L (ref 3.5–5.1)
Sodium: 140 mmol/L (ref 135–145)

## 2019-03-17 LAB — TYPE AND SCREEN
ABO/RH(D): A POS
Antibody Screen: NEGATIVE

## 2019-03-17 LAB — ABO/RH: ABO/RH(D): A POS

## 2019-03-17 LAB — PROTIME-INR
INR: 0.9 (ref 0.8–1.2)
Prothrombin Time: 12.2 seconds (ref 11.4–15.2)

## 2019-03-17 LAB — CBC WITH DIFFERENTIAL/PLATELET
Abs Immature Granulocytes: 0.02 10*3/uL (ref 0.00–0.07)
Basophils Absolute: 0 10*3/uL (ref 0.0–0.1)
Basophils Relative: 0 %
Eosinophils Absolute: 0.1 10*3/uL (ref 0.0–0.5)
Eosinophils Relative: 2 %
HCT: 40.9 % (ref 36.0–46.0)
Hemoglobin: 13 g/dL (ref 12.0–15.0)
Immature Granulocytes: 0 %
Lymphocytes Relative: 39 %
Lymphs Abs: 2.5 10*3/uL (ref 0.7–4.0)
MCH: 28.8 pg (ref 26.0–34.0)
MCHC: 31.8 g/dL (ref 30.0–36.0)
MCV: 90.5 fL (ref 80.0–100.0)
Monocytes Absolute: 0.5 10*3/uL (ref 0.1–1.0)
Monocytes Relative: 7 %
Neutro Abs: 3.3 10*3/uL (ref 1.7–7.7)
Neutrophils Relative %: 52 %
Platelets: 264 10*3/uL (ref 150–400)
RBC: 4.52 MIL/uL (ref 3.87–5.11)
RDW: 12.3 % (ref 11.5–15.5)
WBC: 6.4 10*3/uL (ref 4.0–10.5)
nRBC: 0 % (ref 0.0–0.2)

## 2019-03-17 LAB — SURGICAL PCR SCREEN
MRSA, PCR: NEGATIVE
Staphylococcus aureus: NEGATIVE

## 2019-03-17 NOTE — Progress Notes (Addendum)
Covid test done today. No h/o symptoms concerning for Covid. No h/o recent travel. No h/o exposure to Covid positive patients.  PCP - Evelina Dun, FNP  Cardiologist - Dr Buford Dresser  Chest x-ray - 02-02-19  EKG - 01-31-19  Stress Test - 03-02-2010 Epic  ECHO - 09-21-2016  Cardiac Cath - denies  AICD-denies PM-denies LOOP-denies  Sleep Study - denies CPAP - NA  LABS-PCR, T/S, CBC diff, BMP, PT  ASA-denies  ERAS-NA  HA1C-denies Fasting Blood Sugar -  Checks Blood Sugar _____ times a day  Anesthesia-Y. H/O HTN, seizures(h/o possible CVA ?) Pt has notable stuttering. Asymptomatic otherwise.  Pt denies having chest pain, sob, or fever at this time. All instructions explained to the pt, with a verbal understanding of the material. Pt agrees to go over the instructions while at home for a better understanding. Pt also instructed to self quarantine after being tested for COVID-19. The opportunity to ask questions was provided.

## 2019-03-17 NOTE — Telephone Encounter (Signed)
Called pt to inform that Dr. Harrell Gave would like her to have Lexiscan due to not being able to get heart rate down for Cardiac CTA. Pt voiced understanding and new orders placed.

## 2019-03-17 NOTE — Progress Notes (Addendum)
Anesthesia Chart Review:  Case: [id number] Date/Time: 03/20/19 1100   Procedure: ACDF - C4-C5 - C5-C6 (N/A )   Anesthesia type: General   Pre-op diagnosis: Stenosis   Location: [facility] OR ROOM 62 / [facility]   Surgeon: Eustace Moore, MD      DISCUSSION: Patient is a 48 year old female scheduled for the above procedure.   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 BMI is consistent with obesity.   Patient had attempted cardiac CT on 03/05/09 for further evaluation of atypical chest pain (ED visit 02/01/19 and 01/06/19), but staff unable to safely lower HR without dropping BP, so procedure canceled. As of 03/17/19, cardiologist Dr. Harrell Gave recommended a Lexiscan instead. I have notified Lorriane Shire at Dr. Ronnald Ramp' office that a stress test has been ordered, so she can follow-up with patient/cardiology to see if it can be scheduled prior to 03/20/19 surgery.  03/17/19 presurgical COVID-19 test is in process. Chart will be left for follow-up regarding timing/results of stress test.   ADDENDUM 03/19/19 1:03 PM: COVID-19 test negative.   Stress test done today and showed:  Nuclear stress EF: 51%. Visually, the EF appears to be greater than the computer calculated 51% and appears normal  There was no ST segment deviation noted during stress.  This is a low risk study. There is no evidence of ischemia or previous infarction  The study is normal. - Results reviewed by Dr. Harrell Gave who wrote, "Excellent news, the stress test did not show any evidence of blockages. This means that the chest pain is unlikely to be due to the heart."  Based on available information, I would anticipate that she can proceed as planned.    VS: BP 131/70   Pulse 96   Temp (!) 36.2 C   Resp 20   Ht 5'  7" (1.702 m)   Wt 100.9 kg   LMP 02/18/2019   SpO2 100%   BMI 34.85 kg/m     PROVIDERS: Sharion Balloon, FNP is PCP ([facility]) - Buford Dresser, MD is cardiologist - Manus Rudd, MD is GI - Marcial Pacas, MD is neurologist. Last visit 12/27/16. Patient was initially seen by neurologist Phillips Odor, MD in August 2016 following syncopal episode that occurred in the setting of pain and hyperventilation while changing a knee dressing at home on POD4 following arthroscopic left knee surgery. Following syncope, she developed stammering dysarthria that was thought "likely psychosomatic in nature". Brain MRI/MRA and EEG were normal at that time. No ENT etiology found. She was referred to speech therapy. Hypotension felt to be a contributing cause of her syncope.    LABS: Labs reviewed: Acceptable for surgery. (all labs ordered are listed, but only abnormal results are displayed)  Labs Reviewed  BASIC METABOLIC PANEL - Abnormal; Notable for the following components:      Result Value   Glucose, Bld 101 (*)    All other components within normal limits  SURGICAL PCR SCREEN  CBC WITH DIFFERENTIAL/PLATELET  PROTIME-INR  TYPE AND SCREEN  ABO/RH  (06/30/18 Thyroid panel: TSH 2.660, Total T4 8.9, T3 uptake ratio 22 L, Free thyroxine index 2.0)   OTHER: EEG 11/06/16: Impression: This is a normal EEG recording in the waking and  drowsy state. No evidence of ictal or interictal discharges are  seen.    IMAGES: CXR  01/31/19: FINDINGS: The heart size and mediastinal contours are within normal limits. Both lungs are clear. The visualized skeletal structures are unremarkable. IMPRESSION: No active cardiopulmonary disease.  US Thyroid 06/27/18: IMPRESSION: Markedly heterogeneous and borderline enlarged thyroid gland without discrete nodule or mass. Findings are nonspecific though could be seen in the setting of a thyroiditis. (06/30/18 Thyroid panel: TSH  2.660, Total T4 8.9, T3 uptake ratio 22 L, Free thyroxine index 2.0)   CT head/neck 06/14/18: IMPRESSION: HEAD CT 1. Normal enhanced CT scan of the brain. CERVICAL CT 1. No fracture or acute finding. Disc degenerative changes as described. Mild degrees of left neural foraminal narrowing. 2. Enlarged thyroid with probable nodules. Consider non emergent follow-up thyroid ultrasound for further assessment.  CT soft tissue neck 02/19/18: IMPRESSION: 1. 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 No associated carotid luminal irregularity, and no other arterial abnormality in the neck or visible chest. Minimal to mild associated left lower parapharyngeal and retropharyngeal space edema. 2. Otherwise negative neck soft tissues. 3. Multilevel cervical spine degeneration, including up to mild degenerative spinal stenosis at C5-C6. Normal CT appearance of the bilateral TMJ.   EKG: 01/31/19: ST @ 114 bpm   CV: Nuclear stress test 03/19/19:  Nuclear stress EF: 51%. Visually, the EF appears to be greater than the computer calculated 51% and appears normal  There was no ST segment deviation noted during stress.  This is a low risk study. There is no evidence of ischemia or previous infarction  The study is normal.    Echo 09/21/16: Study Conclusions - Left ventricle: The cavity size was normal. Wall thickness was   normal. Systolic function was normal. The estimated ejection   fraction was in the range of 60% to 65%. Wall motion was normal;   there were no regional wall motion abnormalities. Doppler   parameters are consistent with abnormal left ventricular   relaxation (grade 1 diastolic dysfunction). - Aortic valve: Valve area (VTI): 1.62 cm^2. Valve area (Vmax):   1.62 cm^2. - Technically adequate study.   Carotid US 12/13/14: IMPRESSION: Mild bilateral carotid bifurcation plaque. No flow  limiting stenosis. Degree of stenosis less than 50%.   Past Medical History:  Diagnosis Date  . Asthma    childhood  . Carotidynia   . DDD (degenerative disc disease), cervical   . Hypertension   . Knee pain    left meniscus tear s/p repair 11/2014  . Seizures ([place])   . Stuttering    patient reports this started after her knee surgery in 11/2014.   Marland Kitchen Thyroid nodule     Past Surgical History:  Procedure Laterality Date  . COLONOSCOPY N/A 08/23/2016   Procedure: COLONOSCOPY;  Surgeon: Daneil Dolin, MD;  Location: [facility]O SUITE;  Service: Endoscopy;  Laterality: N/A;  1:00pm  . ESOPHAGOGASTRODUODENOSCOPY N/A 05/03/2016   Procedure: ESOPHAGOGASTRODUODENOSCOPY (EGD);  Surgeon: Daneil Dolin, MD;  Location: [facility] ENDO SUITE;  Service: Endoscopy;  Laterality: N/A;  . ESOPHAGOGASTRODUODENOSCOPY N/A 08/23/2016   Procedure: ESOPHAGOGASTRODUODENOSCOPY (EGD);  Surgeon: Daneil Dolin, MD;  Location: [facility];  Service: Endoscopy;  Laterality: N/A;  . KNEE ARTHROSCOPY WITH MEDIAL MENISECTOMY Left 12/09/2014   Procedure: KNEE ARTHROSCOPY WITH PARTIAL MEDIAL AND LATERAL MENISECTOMY;  Surgeon: Sanjuana Kava, MD;  Location: [facility] ORS;  Service: Orthopedics;  Laterality: Left;  . TUBAL LIGATION    . WISDOM TOOTH EXTRACTION      MEDICATIONS: . acetaminophen (TYLENOL) 500 MG tablet  .  amLODipine (NORVASC) 10 MG tablet  . cyclobenzaprine (FLEXERIL) 5 MG tablet  . lisinopril (ZESTRIL) 20 MG tablet  . metoprolol tartrate (LOPRESSOR) 100 MG tablet  . naproxen sodium (ANAPROX) 550 MG tablet  . pantoprazole (PROTONIX) 40 MG tablet  . sucralfate (CARAFATE) 1 g tablet  . Vitamin D, Ergocalciferol, (DRISDOL) 1.25 MG (50000 UT) CAPS capsule   No current facility-administered medications for this encounter.     Myra Gianotti, PA-C Surgical Short Stay/Anesthesiology [facility] Phone [phone] [facility] Phone [phone] 03/17/2019 4:35 PM

## 2019-03-18 ENCOUNTER — Telehealth (HOSPITAL_COMMUNITY): Payer: Self-pay

## 2019-03-18 LAB — NOVEL CORONAVIRUS, NAA (HOSP ORDER, SEND-OUT TO REF LAB; TAT 18-24 HRS): SARS-CoV-2, NAA: NOT DETECTED

## 2019-03-18 NOTE — Telephone Encounter (Signed)
Encounter complete. 

## 2019-03-19 ENCOUNTER — Ambulatory Visit (HOSPITAL_COMMUNITY)
Admission: RE | Admit: 2019-03-19 | Discharge: 2019-03-19 | Disposition: A | Discharge status: Home / Self Care | Payer: Medicare Other | Source: Ambulatory Visit | Attending: Cardiology | Admitting: Cardiology

## 2019-03-19 ENCOUNTER — Encounter (HOSPITAL_COMMUNITY): Payer: Medicare Other

## 2019-03-19 ENCOUNTER — Other Ambulatory Visit: Payer: Self-pay

## 2019-03-19 DIAGNOSIS — R072 Precordial pain: Secondary | ICD-10-CM | POA: Diagnosis not present

## 2019-03-19 LAB — MYOCARDIAL PERFUSION IMAGING
LV dias vol: 95 mL (ref 46–106)
LV sys vol: 46 mL
Peak HR: 130 {beats}/min
Rest HR: 89 {beats}/min
SDS: 0
SRS: 0
SSS: 0
TID: 1.03

## 2019-03-19 MED ORDER — TECHNETIUM TC 99M TETROFOSMIN IV KIT
31.2000 | PACK | Freq: Once | INTRAVENOUS | Status: AC | PRN
  Administered 2019-03-19: 31.2 via INTRAVENOUS
  Filled 2019-03-19: qty 32

## 2019-03-19 MED ORDER — REGADENOSON 0.4 MG/5ML IV SOLN
0.4000 mg | Freq: Once | INTRAVENOUS | Status: AC
  Administered 2019-03-19: 0.4 mg via INTRAVENOUS

## 2019-03-19 MED ORDER — TECHNETIUM TC 99M TETROFOSMIN IV KIT
10.4000 | PACK | Freq: Once | INTRAVENOUS | Status: AC | PRN
  Administered 2019-03-19: 10.4 via INTRAVENOUS
  Filled 2019-03-19: qty 11

## 2019-03-19 NOTE — Anesthesia Preprocedure Evaluation (Addendum)
Anesthesia Evaluation  Patient identified by MRN, date of birth, ID band Patient awake    Reviewed: Allergy & Precautions, NPO status , Patient's Chart, lab work & pertinent test results  Airway Mallampati: III  TM Distance: >3 FB Neck ROM: Full    Dental no notable dental hx. (+) Teeth Intact, Dental Advisory Given   Pulmonary asthma ,    Pulmonary exam normal breath sounds clear to auscultation       Cardiovascular hypertension, Pt. on medications Normal cardiovascular exam Rhythm:Regular Rate:Normal     Neuro/Psych Seizures -, Well Controlled,  Stutters  negative psych ROS   GI/Hepatic Neg liver ROS, PUD, GERD  Medicated and Controlled,  Endo/Other  negative endocrine ROS  Renal/GU negative Renal ROS     Musculoskeletal negative musculoskeletal ROS (+)   Abdominal (+) + obese,   Peds  Hematology negative hematology ROS (+)   Anesthesia Other Findings Cervical Stenosis  Reproductive/Obstetrics hcg negative                          Anesthesia Physical Anesthesia Plan  ASA: III  Anesthesia Plan: General   Post-op Pain Management:    Induction: Intravenous  PONV Risk Score and Plan: 4 or greater and Midazolam, Dexamethasone, Ondansetron and Treatment may vary due to age or medical condition  Airway Management Planned: Oral ETT and Video Laryngoscope Planned  Additional Equipment:   Intra-op Plan:   Post-operative Plan: Extubation in OR  Informed Consent: I have reviewed the patients History and Physical, chart, labs and discussed the procedure including the risks, benefits and alternatives for the proposed anesthesia with the patient or authorized representative who has indicated his/her understanding and acceptance.     Dental advisory given  Plan Discussed with: CRNA  Anesthesia Plan Comments: (Reviewed PAT note written 03/19/2019 by Myra Gianotti, PA-C. Low risk stress  test 03/19/19. )      Anesthesia Quick Evaluation

## 2019-03-20 ENCOUNTER — Telehealth: Payer: Self-pay | Admitting: *Deleted

## 2019-03-20 ENCOUNTER — Ambulatory Visit (HOSPITAL_COMMUNITY): Payer: Medicare Other

## 2019-03-20 ENCOUNTER — Observation Stay (HOSPITAL_COMMUNITY)
Admission: RE | Admit: 2019-03-20 | Discharge: 2019-03-21 | Disposition: A | Discharge status: Home / Self Care | Payer: Medicare Other | Attending: Neurological Surgery | Admitting: Neurological Surgery

## 2019-03-20 ENCOUNTER — Encounter (HOSPITAL_COMMUNITY): Payer: Self-pay

## 2019-03-20 ENCOUNTER — Ambulatory Visit (HOSPITAL_COMMUNITY): Payer: Medicare Other | Admitting: Vascular Surgery

## 2019-03-20 ENCOUNTER — Encounter (HOSPITAL_COMMUNITY)
Admission: RE | Disposition: A | Discharge status: Home / Self Care | Payer: Self-pay | Source: Home / Self Care | Attending: Neurological Surgery

## 2019-03-20 ENCOUNTER — Other Ambulatory Visit: Payer: Self-pay

## 2019-03-20 DIAGNOSIS — J45909 Unspecified asthma, uncomplicated: Secondary | ICD-10-CM | POA: Diagnosis not present

## 2019-03-20 DIAGNOSIS — I10 Essential (primary) hypertension: Secondary | ICD-10-CM | POA: Insufficient documentation

## 2019-03-20 DIAGNOSIS — Z981 Arthrodesis status: Secondary | ICD-10-CM

## 2019-03-20 DIAGNOSIS — M47812 Spondylosis without myelopathy or radiculopathy, cervical region: Secondary | ICD-10-CM | POA: Diagnosis not present

## 2019-03-20 DIAGNOSIS — K219 Gastro-esophageal reflux disease without esophagitis: Secondary | ICD-10-CM | POA: Diagnosis not present

## 2019-03-20 DIAGNOSIS — Z79899 Other long term (current) drug therapy: Secondary | ICD-10-CM | POA: Insufficient documentation

## 2019-03-20 DIAGNOSIS — M4802 Spinal stenosis, cervical region: Secondary | ICD-10-CM | POA: Diagnosis not present

## 2019-03-20 DIAGNOSIS — Z419 Encounter for procedure for purposes other than remedying health state, unspecified: Secondary | ICD-10-CM

## 2019-03-20 DIAGNOSIS — M50221 Other cervical disc displacement at C4-C5 level: Secondary | ICD-10-CM | POA: Diagnosis not present

## 2019-03-20 HISTORY — PX: ANTERIOR CERVICAL DECOMP/DISCECTOMY FUSION: SHX1161

## 2019-03-20 LAB — POCT PREGNANCY, URINE: Preg Test, Ur: NEGATIVE

## 2019-03-20 SURGERY — ANTERIOR CERVICAL DECOMPRESSION/DISCECTOMY FUSION 2 LEVELS
Anesthesia: General | Site: Spine Cervical

## 2019-03-20 MED ORDER — HYDROXYZINE HCL 50 MG/ML IM SOLN
50.0000 mg | Freq: Four times a day (QID) | INTRAMUSCULAR | Status: DC | PRN
  Administered 2019-03-20: 50 mg via INTRAMUSCULAR
  Filled 2019-03-20: qty 1

## 2019-03-20 MED ORDER — POTASSIUM CHLORIDE IN NACL 20-0.9 MEQ/L-% IV SOLN: INTRAVENOUS | Status: DC

## 2019-03-20 MED ORDER — SUGAMMADEX SODIUM 200 MG/2ML IV SOLN
INTRAVENOUS | Status: DC | PRN
  Administered 2019-03-20: 200 mg via INTRAVENOUS

## 2019-03-20 MED ORDER — CHLORHEXIDINE GLUCONATE CLOTH 2 % EX PADS: 6.0000 | MEDICATED_PAD | Freq: Once | CUTANEOUS | Status: DC

## 2019-03-20 MED ORDER — THROMBIN 20000 UNITS EX KIT
PACK | CUTANEOUS | Status: AC
  Filled 2019-03-20: qty 1

## 2019-03-20 MED ORDER — BUPIVACAINE HCL (PF) 0.25 % IJ SOLN
INTRAMUSCULAR | Status: AC
  Filled 2019-03-20: qty 30

## 2019-03-20 MED ORDER — PROMETHAZINE HCL 25 MG/ML IJ SOLN: 6.2500 mg | INTRAMUSCULAR | Status: DC | PRN

## 2019-03-20 MED ORDER — BUPIVACAINE HCL (PF) 0.25 % IJ SOLN
INTRAMUSCULAR | Status: DC | PRN
  Administered 2019-03-20: 10 mL

## 2019-03-20 MED ORDER — FENTANYL CITRATE (PF) 250 MCG/5ML IJ SOLN
INTRAMUSCULAR | Status: AC
  Filled 2019-03-20: qty 5

## 2019-03-20 MED ORDER — HYDROCODONE-ACETAMINOPHEN 5-325 MG PO TABS
1.0000 | ORAL_TABLET | ORAL | Status: DC | PRN
  Administered 2019-03-20 – 2019-03-21 (×2): 2 via ORAL
  Filled 2019-03-20 (×3): qty 2

## 2019-03-20 MED ORDER — HYDROMORPHONE HCL 1 MG/ML IJ SOLN: 0.2500 mg | INTRAMUSCULAR | Status: DC | PRN

## 2019-03-20 MED ORDER — METHOCARBAMOL 500 MG PO TABS
ORAL_TABLET | ORAL | Status: AC
  Filled 2019-03-20: qty 1

## 2019-03-20 MED ORDER — SENNA 8.6 MG PO TABS
1.0000 | ORAL_TABLET | Freq: Two times a day (BID) | ORAL | Status: DC
  Administered 2019-03-20 (×2): 8.6 mg via ORAL
  Filled 2019-03-20 (×2): qty 1

## 2019-03-20 MED ORDER — PHENYLEPHRINE HCL-NACL 10-0.9 MG/250ML-% IV SOLN
INTRAVENOUS | Status: DC | PRN
  Administered 2019-03-20: 30 ug/min via INTRAVENOUS

## 2019-03-20 MED ORDER — DEXAMETHASONE SODIUM PHOSPHATE 10 MG/ML IJ SOLN
INTRAMUSCULAR | Status: AC
  Filled 2019-03-20: qty 1

## 2019-03-20 MED ORDER — ONDANSETRON HCL 4 MG/2ML IJ SOLN
INTRAMUSCULAR | Status: DC | PRN
  Administered 2019-03-20: 4 mg via INTRAVENOUS

## 2019-03-20 MED ORDER — PROPOFOL 10 MG/ML IV BOLUS
INTRAVENOUS | Status: AC
  Filled 2019-03-20: qty 20

## 2019-03-20 MED ORDER — OXYCODONE HCL 5 MG PO TABS
ORAL_TABLET | ORAL | Status: AC
  Filled 2019-03-20: qty 1

## 2019-03-20 MED ORDER — THROMBIN 5000 UNITS EX SOLR
CUTANEOUS | Status: DC | PRN
  Administered 2019-03-20 (×2): 5000 [IU] via TOPICAL

## 2019-03-20 MED ORDER — ONDANSETRON HCL 4 MG/2ML IJ SOLN
INTRAMUSCULAR | Status: AC
  Filled 2019-03-20: qty 2

## 2019-03-20 MED ORDER — AMLODIPINE BESYLATE 5 MG PO TABS: 10.0000 mg | ORAL_TABLET | Freq: Every day | ORAL | Status: DC

## 2019-03-20 MED ORDER — PROPOFOL 10 MG/ML IV BOLUS
INTRAVENOUS | Status: DC | PRN
  Administered 2019-03-20: 170 mg via INTRAVENOUS
  Administered 2019-03-20: 30 mg via INTRAVENOUS

## 2019-03-20 MED ORDER — SODIUM CHLORIDE 0.9% FLUSH
3.0000 mL | Freq: Two times a day (BID) | INTRAVENOUS | Status: DC
  Administered 2019-03-20: 22:00:00 3 mL via INTRAVENOUS

## 2019-03-20 MED ORDER — MENTHOL 3 MG MT LOZG
1.0000 | LOZENGE | OROMUCOSAL | Status: DC | PRN
  Filled 2019-03-20: qty 9

## 2019-03-20 MED ORDER — DEXAMETHASONE SODIUM PHOSPHATE 10 MG/ML IJ SOLN
10.0000 mg | Freq: Once | INTRAMUSCULAR | Status: AC
  Administered 2019-03-20: 10 mg via INTRAVENOUS

## 2019-03-20 MED ORDER — DEXAMETHASONE 4 MG PO TABS: 4.0000 mg | ORAL_TABLET | Freq: Four times a day (QID) | ORAL | Status: DC

## 2019-03-20 MED ORDER — MIDAZOLAM HCL 5 MG/5ML IJ SOLN
INTRAMUSCULAR | Status: DC | PRN
  Administered 2019-03-20: 2 mg via INTRAVENOUS

## 2019-03-20 MED ORDER — OXYCODONE HCL 5 MG PO TABS
5.0000 mg | ORAL_TABLET | Freq: Once | ORAL | Status: AC | PRN
  Administered 2019-03-20: 14:00:00 5 mg via ORAL

## 2019-03-20 MED ORDER — GLYCOPYRROLATE PF 0.2 MG/ML IJ SOSY
PREFILLED_SYRINGE | INTRAMUSCULAR | Status: DC | PRN
  Administered 2019-03-20: .1 mg via INTRAVENOUS

## 2019-03-20 MED ORDER — LACTATED RINGERS IV SOLN
INTRAVENOUS | Status: DC
  Administered 2019-03-20 (×2): via INTRAVENOUS

## 2019-03-20 MED ORDER — LISINOPRIL 20 MG PO TABS: 20.0000 mg | ORAL_TABLET | Freq: Every day | ORAL | Status: DC

## 2019-03-20 MED ORDER — ONDANSETRON HCL 4 MG/2ML IJ SOLN: 4.0000 mg | Freq: Four times a day (QID) | INTRAMUSCULAR | Status: DC | PRN

## 2019-03-20 MED ORDER — MORPHINE SULFATE (PF) 2 MG/ML IV SOLN: 2.0000 mg | INTRAVENOUS | Status: DC | PRN

## 2019-03-20 MED ORDER — CEFAZOLIN SODIUM-DEXTROSE 2-4 GM/100ML-% IV SOLN
2.0000 g | INTRAVENOUS | Status: AC
  Administered 2019-03-20: 2 g via INTRAVENOUS

## 2019-03-20 MED ORDER — ACETAMINOPHEN 500 MG PO TABS
ORAL_TABLET | ORAL | Status: AC
  Administered 2019-03-20: 10:00:00 1000 mg via ORAL
  Filled 2019-03-20: qty 2

## 2019-03-20 MED ORDER — SODIUM CHLORIDE 0.9 % IV SOLN
INTRAVENOUS | Status: DC | PRN
  Administered 2019-03-20: 500 mL

## 2019-03-20 MED ORDER — LIDOCAINE 2% (20 MG/ML) 5 ML SYRINGE
INTRAMUSCULAR | Status: DC | PRN
  Administered 2019-03-20: 80 mg via INTRAVENOUS

## 2019-03-20 MED ORDER — PHENYLEPHRINE 40 MCG/ML (10ML) SYRINGE FOR IV PUSH (FOR BLOOD PRESSURE SUPPORT)
PREFILLED_SYRINGE | INTRAVENOUS | Status: DC | PRN
  Administered 2019-03-20: 80 ug via INTRAVENOUS
  Administered 2019-03-20: 40 ug via INTRAVENOUS
  Administered 2019-03-20: 80 ug via INTRAVENOUS
  Administered 2019-03-20 (×3): 40 ug via INTRAVENOUS

## 2019-03-20 MED ORDER — OXYCODONE HCL 5 MG/5ML PO SOLN: 5.0000 mg | Freq: Once | ORAL | Status: AC | PRN

## 2019-03-20 MED ORDER — 0.9 % SODIUM CHLORIDE (POUR BTL) OPTIME
TOPICAL | Status: DC | PRN
  Administered 2019-03-20: 1000 mL

## 2019-03-20 MED ORDER — SODIUM CHLORIDE 0.9 % IV SOLN: 250.0000 mL | INTRAVENOUS | Status: DC

## 2019-03-20 MED ORDER — FENTANYL CITRATE (PF) 100 MCG/2ML IJ SOLN
INTRAMUSCULAR | Status: DC | PRN
  Administered 2019-03-20: 100 ug via INTRAVENOUS
  Administered 2019-03-20: 50 ug via INTRAVENOUS

## 2019-03-20 MED ORDER — ACETAMINOPHEN 325 MG PO TABS: 650.0000 mg | ORAL_TABLET | ORAL | Status: DC | PRN

## 2019-03-20 MED ORDER — HEMOSTATIC AGENTS (NO CHARGE) OPTIME
TOPICAL | Status: DC | PRN
  Administered 2019-03-20: 1

## 2019-03-20 MED ORDER — ONDANSETRON HCL 4 MG PO TABS: 4.0000 mg | ORAL_TABLET | Freq: Four times a day (QID) | ORAL | Status: DC | PRN

## 2019-03-20 MED ORDER — THROMBIN 5000 UNITS EX SOLR
CUTANEOUS | Status: AC
  Filled 2019-03-20: qty 15000

## 2019-03-20 MED ORDER — PHENOL 1.4 % MT LIQD: 1.0000 | OROMUCOSAL | Status: DC | PRN

## 2019-03-20 MED ORDER — PANTOPRAZOLE SODIUM 40 MG PO TBEC
40.0000 mg | DELAYED_RELEASE_TABLET | Freq: Every day | ORAL | Status: DC
  Administered 2019-03-21: 05:00:00 40 mg via ORAL
  Filled 2019-03-20: qty 1

## 2019-03-20 MED ORDER — ACETAMINOPHEN 500 MG PO TABS
1000.0000 mg | ORAL_TABLET | Freq: Once | ORAL | Status: AC
  Administered 2019-03-20: 10:00:00 1000 mg via ORAL

## 2019-03-20 MED ORDER — METHOCARBAMOL 1000 MG/10ML IJ SOLN
500.0000 mg | Freq: Four times a day (QID) | INTRAVENOUS | Status: DC | PRN
  Filled 2019-03-20: qty 5

## 2019-03-20 MED ORDER — ACETAMINOPHEN 650 MG RE SUPP: 650.0000 mg | RECTAL | Status: DC | PRN

## 2019-03-20 MED ORDER — DEXAMETHASONE SODIUM PHOSPHATE 4 MG/ML IJ SOLN
4.0000 mg | Freq: Four times a day (QID) | INTRAMUSCULAR | Status: DC
  Administered 2019-03-20 – 2019-03-21 (×3): 4 mg via INTRAVENOUS
  Filled 2019-03-20 (×3): qty 1

## 2019-03-20 MED ORDER — SUCRALFATE 1 G PO TABS
1.0000 g | ORAL_TABLET | Freq: Four times a day (QID) | ORAL | Status: DC | PRN
  Filled 2019-03-20: qty 1

## 2019-03-20 MED ORDER — HYDROCODONE-ACETAMINOPHEN 5-325 MG PO TABS: 1.0000 | ORAL_TABLET | ORAL | Status: DC | PRN

## 2019-03-20 MED ORDER — SODIUM CHLORIDE 0.9% FLUSH: 3.0000 mL | INTRAVENOUS | Status: DC | PRN

## 2019-03-20 MED ORDER — MIDAZOLAM HCL 2 MG/2ML IJ SOLN
INTRAMUSCULAR | Status: AC
  Filled 2019-03-20: qty 2

## 2019-03-20 MED ORDER — PHENYLEPHRINE 40 MCG/ML (10ML) SYRINGE FOR IV PUSH (FOR BLOOD PRESSURE SUPPORT)
PREFILLED_SYRINGE | INTRAVENOUS | Status: AC
  Filled 2019-03-20: qty 10

## 2019-03-20 MED ORDER — THROMBIN 5000 UNITS EX SOLR
OROMUCOSAL | Status: DC | PRN
  Administered 2019-03-20: 5 mL

## 2019-03-20 MED ORDER — ROCURONIUM BROMIDE 10 MG/ML (PF) SYRINGE
PREFILLED_SYRINGE | INTRAVENOUS | Status: DC | PRN
  Administered 2019-03-20: 60 mg via INTRAVENOUS
  Administered 2019-03-20 (×2): 20 mg via INTRAVENOUS

## 2019-03-20 MED ORDER — ROCURONIUM BROMIDE 10 MG/ML (PF) SYRINGE
PREFILLED_SYRINGE | INTRAVENOUS | Status: AC
  Filled 2019-03-20: qty 10

## 2019-03-20 MED ORDER — CEFAZOLIN SODIUM-DEXTROSE 2-4 GM/100ML-% IV SOLN
INTRAVENOUS | Status: AC
  Filled 2019-03-20: qty 100

## 2019-03-20 MED ORDER — CEFAZOLIN SODIUM-DEXTROSE 2-4 GM/100ML-% IV SOLN
2.0000 g | Freq: Three times a day (TID) | INTRAVENOUS | Status: AC
  Administered 2019-03-20 (×2): 2 g via INTRAVENOUS
  Filled 2019-03-20 (×2): qty 100

## 2019-03-20 MED ORDER — METHOCARBAMOL 500 MG PO TABS
500.0000 mg | ORAL_TABLET | Freq: Four times a day (QID) | ORAL | Status: DC | PRN
  Administered 2019-03-20 – 2019-03-21 (×2): 500 mg via ORAL
  Filled 2019-03-20: qty 1

## 2019-03-20 SURGICAL SUPPLY — 55 items
BAG DECANTER FOR FLEXI CONT (MISCELLANEOUS) ×3 IMPLANT
BASKET BONE COLLECTION (BASKET) IMPLANT
BENZOIN TINCTURE PRP APPL 2/3 (GAUZE/BANDAGES/DRESSINGS) ×3 IMPLANT
BIT DRILL 13 (BIT) ×2 IMPLANT
BIT DRILL 13MM (BIT) ×1
BUR MATCHSTICK NEURO 3.0 LAGG (BURR) ×3 IMPLANT
CANISTER SUCT 3000ML PPV (MISCELLANEOUS) ×3 IMPLANT
CARTRIDGE OIL MAESTRO DRILL (MISCELLANEOUS) ×1 IMPLANT
CLOSURE WOUND 1/2 X4 (GAUZE/BANDAGES/DRESSINGS) ×1
COVER WAND RF STERILE (DRAPES) ×3 IMPLANT
DERMABOND ADVANCED (GAUZE/BANDAGES/DRESSINGS) ×2
DERMABOND ADVANCED .7 DNX12 (GAUZE/BANDAGES/DRESSINGS) ×1 IMPLANT
DEVICE ENDSKLTN TC NANOLCK 6MM (Cage) ×2 IMPLANT
DIFFUSER DRILL AIR PNEUMATIC (MISCELLANEOUS) ×3 IMPLANT
DRAPE C-ARM 42X72 X-RAY (DRAPES) ×6 IMPLANT
DRAPE LAPAROTOMY 100X72 PEDS (DRAPES) ×3 IMPLANT
DRAPE MICROSCOPE LEICA (MISCELLANEOUS) ×3 IMPLANT
DRSG OPSITE POSTOP 4X6 (GAUZE/BANDAGES/DRESSINGS) ×3 IMPLANT
DURAPREP 6ML APPLICATOR 50/CS (WOUND CARE) ×3 IMPLANT
ELECT COATED BLADE 2.86 ST (ELECTRODE) ×3 IMPLANT
ELECT REM PT RETURN 9FT ADLT (ELECTROSURGICAL) ×3
ELECTRODE REM PT RTRN 9FT ADLT (ELECTROSURGICAL) ×1 IMPLANT
ENDOSKELETON TC NANOLOCK 6MM (Cage) ×6 IMPLANT
GAUZE 4X4 16PLY RFD (DISPOSABLE) IMPLANT
GLOVE BIO SURGEON STRL SZ7 (GLOVE) IMPLANT
GLOVE BIO SURGEON STRL SZ8 (GLOVE) ×3 IMPLANT
GLOVE BIOGEL PI IND STRL 7.0 (GLOVE) IMPLANT
GLOVE BIOGEL PI INDICATOR 7.0 (GLOVE)
GOWN STRL REUS W/ TWL LRG LVL3 (GOWN DISPOSABLE) IMPLANT
GOWN STRL REUS W/ TWL XL LVL3 (GOWN DISPOSABLE) ×1 IMPLANT
GOWN STRL REUS W/TWL 2XL LVL3 (GOWN DISPOSABLE) IMPLANT
GOWN STRL REUS W/TWL LRG LVL3 (GOWN DISPOSABLE)
GOWN STRL REUS W/TWL XL LVL3 (GOWN DISPOSABLE) ×2
HEMOSTAT POWDER KIT SURGIFOAM (HEMOSTASIS) ×3 IMPLANT
KIT BASIN OR (CUSTOM PROCEDURE TRAY) ×3 IMPLANT
KIT TURNOVER KIT B (KITS) ×3 IMPLANT
NEEDLE HYPO 25X1 1.5 SAFETY (NEEDLE) ×3 IMPLANT
NEEDLE SPNL 20GX3.5 QUINCKE YW (NEEDLE) ×3 IMPLANT
NS IRRIG 1000ML POUR BTL (IV SOLUTION) ×3 IMPLANT
OIL CARTRIDGE MAESTRO DRILL (MISCELLANEOUS) ×3
PACK LAMINECTOMY NEURO (CUSTOM PROCEDURE TRAY) ×3 IMPLANT
PAD ARMBOARD 7.5X6 YLW CONV (MISCELLANEOUS) ×9 IMPLANT
PASTE BONE GRAFTON 1CC (Bone Implant) ×3 IMPLANT
PIN DISTRACTION 14MM (PIN) ×6 IMPLANT
PLATE 39MM (Plate) ×3 IMPLANT
RUBBERBAND STERILE (MISCELLANEOUS) ×6 IMPLANT
SCREW 3.5 SELFDRILL 15MM VARI (Screw) ×18 IMPLANT
SPONGE INTESTINAL PEANUT (DISPOSABLE) ×3 IMPLANT
SPONGE SURGIFOAM ABS GEL SZ50 (HEMOSTASIS) ×3 IMPLANT
STRIP CLOSURE SKIN 1/2X4 (GAUZE/BANDAGES/DRESSINGS) ×2 IMPLANT
SUT VIC AB 3-0 SH 8-18 (SUTURE) ×6 IMPLANT
SUT VICRYL 4-0 PS2 18IN ABS (SUTURE) IMPLANT
TOWEL GREEN STERILE (TOWEL DISPOSABLE) ×3 IMPLANT
TOWEL GREEN STERILE FF (TOWEL DISPOSABLE) ×3 IMPLANT
WATER STERILE IRR 1000ML POUR (IV SOLUTION) ×3 IMPLANT

## 2019-03-20 NOTE — Transfer of Care (Signed)
Immediate Anesthesia Transfer of Care Note  Patient: Brittany Durham  Procedure(s) Performed: Anterior Cervical Decompression Fusion - Cervical Four-Cervical Five - Cervical Five-Cervical Six (N/A Spine Cervical)  Patient Location: PACU  Anesthesia Type:General  Level of Consciousness: oriented, drowsy and patient cooperative  Airway & Oxygen Therapy: Patient Spontanous Breathing and Patient connected to face mask oxygen  Post-op Assessment: Report given to RN and Post -op Vital signs reviewed and stable  Post vital signs: Reviewed  Last Vitals:  Vitals Value Taken Time  BP 152/83 03/20/19 1337  Temp    Pulse 128 03/20/19 1340  Resp 19 03/20/19 1340  SpO2 97 % 03/20/19 1340  Vitals shown include unvalidated device data.  Last Pain:  Vitals:   03/20/19 0952  TempSrc:   PainSc: 0-No pain         Complications: No apparent anesthesia complications

## 2019-03-20 NOTE — Anesthesia Procedure Notes (Signed)
Procedure Name: Intubation Date/Time: 03/20/2019 11:08 AM Performed by: Jenne Campus, CRNA Pre-anesthesia Checklist: Patient identified, Emergency Drugs available, Suction available and Patient being monitored Patient Re-evaluated:Patient Re-evaluated prior to induction Oxygen Delivery Method: Circle system utilized Preoxygenation: Pre-oxygenation with 100% oxygen Induction Type: IV induction Ventilation: Mask ventilation without difficulty and Oral airway inserted - appropriate to patient size Laryngoscope Size: Glidescope and 3 Grade View: Grade I Tube type: Oral Tube size: 7.0 mm Number of attempts: 1 Airway Equipment and Method: Stylet and Oral airway Placement Confirmation: ETT inserted through vocal cords under direct vision,  positive ETCO2 and breath sounds checked- equal and bilateral Secured at: 22 cm Tube secured with: Tape Dental Injury: Teeth and Oropharynx as per pre-operative assessment  Comments: Intubated by Orma Flaming, SRNA

## 2019-03-20 NOTE — Anesthesia Postprocedure Evaluation (Signed)
Anesthesia Post Note  Patient: Brittany Durham  Procedure(s) Performed: Anterior Cervical Decompression Fusion - Cervical Four-Cervical Five - Cervical Five-Cervical Six (N/A Spine Cervical)     Patient location during evaluation: PACU Anesthesia Type: General Level of consciousness: awake and alert Pain management: pain level controlled Vital Signs Assessment: post-procedure vital signs reviewed and stable Respiratory status: spontaneous breathing, nonlabored ventilation, respiratory function stable and patient connected to nasal cannula oxygen Cardiovascular status: blood pressure returned to baseline and stable Postop Assessment: no apparent nausea or vomiting Anesthetic complications: no    Last Vitals:  Vitals:   03/20/19 1430 03/20/19 1516  BP: (!) 146/74 138/88  Pulse:  (!) 130  Resp:  20  Temp:  37.2 C  SpO2:  97%    Last Pain:  Vitals:   03/20/19 1516  TempSrc: Oral  PainSc:                  Ryan P Ellender

## 2019-03-20 NOTE — Telephone Encounter (Signed)
   [facility] Pre-operative Risk Assessment    Request for surgical clearance:  1. What type of surgery is being performed? C4-5, C5-6 anterior cervical fusion  2. When is this surgery scheduled? 03-20-2019   3. What type of clearance is required (medical clearance vs. Pharmacy clearance to hold med vs. Both)? medical  4. Are there any medications that need to be held prior to surgery and how long? none   5. Practice name and name of physician performing surgery? [facility]   6. What is your office phone number 336 [phone]    7.   What is your office fax number [phone]  8.   Anesthesia type (None, local, MAC, general) ? general   Fredia Beets 03/20/2019, 7:28 AM  _________________________________________________________________   (provider comments below)

## 2019-03-20 NOTE — H&P (Signed)
Subjective:   Patient is a 48 y.o. female admitted for acdf. The patient first presented to me with complaints of neck pain, shooting pains in the arm(s) and numbness of the arm(s). Onset of symptoms was several months ago. The pain is described as aching and occurs all day. The pain is rated severe, and is located in the neck and radiates to the arms. The symptoms have been progressive. Symptoms are exacerbated by none, and are relieved by none.  Previous work up includes MRI of cervical spine, results: spinal stenosis.  Past Medical History:  Diagnosis Date  . Asthma    childhood  . Carotidynia   . DDD (degenerative disc disease), cervical   . Hypertension   . Knee pain    left meniscus tear s/p repair 11/2014  . Seizures ([place])   . Stuttering    patient reports this started after her knee surgery in 11/2014.   Marland Kitchen Thyroid nodule     Past Surgical History:  Procedure Laterality Date  . COLONOSCOPY N/A 08/23/2016   Procedure: COLONOSCOPY;  Surgeon: Daneil Dolin, MD;  [facility];  Service: Endoscopy;  Laterality: N/A;  1:00pm  . ESOPHAGOGASTRODUODENOSCOPY N/A 05/03/2016   Procedure: ESOPHAGOGASTRODUODENOSCOPY (EGD);  Surgeon: Daneil Dolin, MD;  Location: [facility];  Service: Endoscopy;  Laterality: N/A;  . ESOPHAGOGASTRODUODENOSCOPY N/A 08/23/2016   Procedure: ESOPHAGOGASTRODUODENOSCOPY (EGD);  Surgeon: Daneil Dolin, MD;  Location: [facility];  Service: Endoscopy;  Laterality: N/A;  . KNEE ARTHROSCOPY WITH MEDIAL MENISECTOMY Left 12/09/2014   Procedure: KNEE ARTHROSCOPY WITH PARTIAL MEDIAL AND LATERAL MENISECTOMY;  Surgeon: Sanjuana Kava, MD;  Location: [facility];  Service: Orthopedics;  Laterality: Left;  . TUBAL LIGATION    . WISDOM TOOTH EXTRACTION      No Known Allergies  Social History   Tobacco Use  . Smoking status: Never Smoker  . Smokeless tobacco: Never Used  Substance Use Topics  . Alcohol use: No    Family History  Problem Relation Age of Onset  .  Hypertension Mother   . Lung cancer Father   . Hypertension Brother   . Colon cancer Neg Hx   . Gastric cancer Neg Hx   . Esophageal cancer Neg Hx    Prior to Admission medications   Medication Sig Start Date End Date Taking? Authorizing Provider  acetaminophen (TYLENOL) 500 MG tablet Take 1,000 mg by mouth every 6 (six) hours as needed (for pain.).   Yes [provider]  amLODipine (NORVASC) 10 MG tablet Take 1 tablet (10 mg total) by mouth daily. 09/17/18  Yes Hawks, Christy A, FNP  lisinopril (ZESTRIL) 20 MG tablet TAKE 1 TABLET DAILY Patient taking differently: Take 20 mg by mouth daily.  01/09/19  Yes Hawks, Christy A, FNP  pantoprazole (PROTONIX) 40 MG tablet Take 1 tablet (40 mg total) by mouth daily before breakfast. 09/17/18  Yes Hawks, Christy A, FNP  sucralfate (CARAFATE) 1 g tablet Take 1 g by mouth 4 (four) times daily as needed (chest pain/reflux.).  01/31/19  Yes [provider]  Vitamin D, Ergocalciferol, (DRISDOL) 1.25 MG (50000 UT) CAPS capsule TAKE 1 CAPSULE ONCE A WEEK Patient taking differently: Take 50,000 Units by mouth every Thursday. TAKE 1 CAPSULE ONCE A WEEK 09/17/18  Yes Hawks, Christy A, FNP  cyclobenzaprine (FLEXERIL) 5 MG tablet Take 1 tablet (5 mg total) by mouth 3 (three) times daily as needed. Patient not taking: Reported on 03/10/2019 02/01/19   Rolland Porter, MD  metoprolol tartrate (LOPRESSOR) 100 MG tablet TAKE 1 TABLET 2 HR PRIOR TO CARDIAC PROCEDURE Patient not taking: Reported on 03/10/2019 01/29/19   Buford Dresser, MD  naproxen sodium (ANAPROX) 550 MG tablet Take 1 tablet (550 mg total) by mouth 2 (two) times daily with a meal. Patient not taking: Reported on 03/10/2019 02/01/19   Rolland Porter, MD     Review of Systems  Positive ROS: neg  All other systems have been reviewed and were otherwise negative with the exception of those mentioned in the HPI and as above.  Objective: Vital signs in last 24 hours: Temp:  [98.2 F (36.8 C)]  98.2 F (36.8 C) (11/06 0939) Pulse Rate:  [117] 117 (11/06 0939) Resp:  [20] 20 (11/06 0939) BP: (189)/(107) 189/107 (11/06 0939) SpO2:  [99 %] 99 % (11/06 0939) Weight:  [100.7 kg] 100.7 kg (11/06 0939)  General Appearance: Alert, cooperative, no distress, appears stated age Head: Normocephalic, without obvious abnormality, atraumatic Eyes: PERRL, conjunctiva/corneas clear, EOM's intact      Neck: Supple, symmetrical, trachea midline, Back: Symmetric, no curvature, ROM normal, no CVA tenderness Lungs:  respirations unlabored Heart: Regular rate and rhythm Abdomen: Soft, non-tender Extremities: Extremities normal, atraumatic, no cyanosis or edema Pulses: 2+ and symmetric all extremities Skin: Skin color, texture, turgor normal, no rashes or lesions  NEUROLOGIC:  Mental status: Alert and oriented x4, no aphasia, good attention span, fund of knowledge and memory  Motor Exam - grossly normal Sensory Exam - grossly normal Reflexes: 1+ Coordination - grossly normal Gait - grossly normal Balance - grossly normal Cranial Nerves: I: smell Not tested  II: visual acuity  OS: nl    OD: nl  II: visual fields Full to confrontation  II: pupils Equal, round, reactive to light  III,VII: ptosis None  III,IV,VI: extraocular muscles  Full ROM  V: mastication Normal  V: facial light touch sensation  Normal  V,VII: corneal reflex  Present  VII: facial muscle function - upper  Normal  VII: facial muscle function - lower Normal  VIII: hearing Not tested  IX: soft palate elevation  Normal  IX,X: gag reflex Present  XI: trapezius strength  5/5  XI: sternocleidomastoid strength 5/5  XI: neck flexion strength  5/5  XII: tongue strength  Normal    Data Review Lab Results  Component Value Date   WBC 6.4 03/17/2019   HGB 13.0 03/17/2019   HCT 40.9 03/17/2019   MCV 90.5 03/17/2019   PLT 264 03/17/2019   Lab Results  Component Value Date   NA 140 03/17/2019   K 4.1 03/17/2019   CL 103  03/17/2019   CO2 26 03/17/2019   BUN 11 03/17/2019   CREATININE 0.83 03/17/2019   GLUCOSE 101 (H) 03/17/2019   Lab Results  Component Value Date   INR 0.9 03/17/2019    Assessment:   Cervical neck pain with herniated nucleus pulposus/ spondylosis/ stenosis at C4-6. Estimated body mass index is 34.77 kg/m as calculated from the following:   Height as of this encounter: 5\' 7"  (1.702 m).   Weight as of this encounter: 100.7 kg.  Patient has failed conservative therapy. Planned surgery : ACDF C4-5 C5-6  Plan:   I explained the condition and procedure to the patient and answered any questions.  Patient wishes to proceed with procedure as planned. Understands risks/ benefits/ and expected or typical outcomes.  Eustace Moore 03/20/2019 10:02 AM

## 2019-03-20 NOTE — Op Note (Signed)
03/20/2019  1:28 PM  PATIENT:  Brittany Durham  48 y.o. 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 Decompressive anterior cervical discectomy C4-5 C5-6, 2. Anterior cervical arthrodesis C4 5 C5-6 utilizing a Titan interbody cage packed with locally harvested morcellized autologous bone graft with morselized allograft, 3. Anterior cervical plating C4-C6 utilizing a Medtronic plate  SURGEON:  Sherley Bounds, MD  ASSISTANTS: Dr. Christella Noa  ANESTHESIA:   General  EBL: 50 ml  Total I/O In: 1000 [I.V.:1000] Out: 50 [Blood:50]  BLOOD ADMINISTERED: none  DRAINS: none  SPECIMEN:  none  INDICATION FOR PROCEDURE: This patient presented with neck and left arm pain. Imaging showed focal spondylosis C4 C5-6 with left foraminal stenosis. The patient tried conservative measures without relief. Pain was debilitating. Recommended ACDF with plating. Patient understood the risks, benefits, and alternatives and potential outcomes and wished to proceed.  PROCEDURE DETAILS: Patient was brought to the operating room placed under general endotracheal anesthesia. Patient was placed in the supine position on the operating room table. The neck was prepped with Duraprep and draped in a sterile fashion.   Three cc of local anesthesia was injected and a transverse incision was made on the right side of the neck.  Dissection was carried down thru the subcutaneous tissue and the platysma was  elevated, opened, and undermined with Metzenbaum scissors.  Dissection was then carried out thru an avascular plane leaving the sternocleidomastoid carotid artery and jugular vein laterally and the trachea and esophagus medially with the assistance of my nurse practitioner. The ventral aspect of the vertebral column was identified and a localizing x-ray was taken. The C4-5 level was identified and all in the room  agreed with the level. The longus colli muscles were then elevated and the retractor was placed with the assistance of my nurse practitioner to expose C4-5 and C5-6.  Anterior osteophytes were removed from both levels.  The annulus was incised at both levels and the disc space entered. Discectomy was performed with micro-curettes and pituitary rongeurs. I then used the high-speed drill to drill the endplates down to the level of the posterior longitudinal ligament. The drill shavings were saved in a mucous trap for later arthrodesis. The operating microscope was draped and brought into the field provided additional magnification, illumination and visualization. Discectomy was continued posteriorly thru the disc space. Posterior longitudinal ligament was opened with a nerve hook, and then removed along with disc herniation and osteophytes, decompressing the spinal canal and thecal sac. We then continued to remove osteophytic overgrowth and disc material decompressing the neural foramina and exiting nerve roots bilaterally.  Spent considerable time on the patient's left side because of the left-sided radiculopathy.  The nerve roots were decompressed distally into the foramen.  At both levels there were large osteophytes on the left compressing the nerve roots.  The scope was angled up and down to help decompress and undercut the vertebral bodies. Once the decompression was completed we could pass a nerve hook circumferentially to assure adequate decompression in the midline and in the neural foramina. So by both visualization and palpation we felt we had an adequate decompression of the neural elements. We then measured the height of the intravertebral disc space and selected a 6 millimeter Titan interbody cage packed with autograft and morselized allograft. It was then gently positioned in the intravertebral disc space(s) and countersunk. I then used a Medtronic plate and  placed variable angle screws into the  vertebral bodies of each level and locked them into position.  Dr. Christella Noa help with placement of the plate. the wound was irrigated with bacitracin solution, checked for hemostasis which was established and confirmed. Once meticulous hemostasis was achieved, we then proceeded with closure with the assistance of my nurse practitioner. The platysma was closed with interrupted 3-0 undyed Vicryl suture, the subcuticular layer was closed with interrupted 3-0 undyed Vicryl suture. The skin edges were approximated with steristrips. The drapes were removed. A sterile dressing was applied. The patient was then awakened from general anesthesia and transferred to the recovery room in stable condition. At the end of the procedure all sponge, needle and instrument counts were correct.   PLAN OF CARE: Admit to inpatient   PATIENT DISPOSITION:  PACU - hemodynamically stable.   Delay start of Pharmacological VTE agent (>24hrs) due to surgical blood loss or risk of bleeding:  yes

## 2019-03-20 NOTE — Telephone Encounter (Signed)
   Primary Cardiologist: Buford Dresser, MD  Chart reviewed as part of pre-operative protocol coverage. Given past medical history and time since last visit, based on ACC/AHA guidelines, Brittany Durham would be at acceptable risk for the planned procedure without further cardiovascular testing.   She was recently seen by Dr. Harrell Gave 01/29/2019 in which she was having chest pain symptoms.  She was evaluated previously at an ED visit with a negative cardiac work-up.  There was low suspicion for cardiac etiology however given patient preference, she was further assessed with a Lexiscan stress test performed 03/19/2019 which showed no evidence of ischemia or previous infarction with an LVEF of 51% found to be normal.  I will route this recommendation to the requesting party via [place] fax function and remove from pre-op pool.  Please call with questions.  Kathyrn Drown, NP 03/20/2019, 8:27 AM

## 2019-03-21 DIAGNOSIS — M4802 Spinal stenosis, cervical region: Secondary | ICD-10-CM | POA: Diagnosis not present

## 2019-03-21 DIAGNOSIS — Z79899 Other long term (current) drug therapy: Secondary | ICD-10-CM | POA: Diagnosis not present

## 2019-03-21 DIAGNOSIS — K219 Gastro-esophageal reflux disease without esophagitis: Secondary | ICD-10-CM | POA: Diagnosis not present

## 2019-03-21 DIAGNOSIS — M50221 Other cervical disc displacement at C4-C5 level: Secondary | ICD-10-CM | POA: Diagnosis not present

## 2019-03-21 DIAGNOSIS — I10 Essential (primary) hypertension: Secondary | ICD-10-CM | POA: Diagnosis not present

## 2019-03-21 DIAGNOSIS — M47812 Spondylosis without myelopathy or radiculopathy, cervical region: Secondary | ICD-10-CM | POA: Diagnosis not present

## 2019-03-21 NOTE — Discharge Instructions (Signed)

## 2019-03-21 NOTE — Progress Notes (Signed)
Patient is discharged from room 3C11 at this time. Alert and in stable condition. IV site d/c'd and instructions read to patient with understanding verbalized. Left unit via wheelchair with all belongings at side. 

## 2019-03-21 NOTE — Discharge Summary (Signed)
Physician Discharge Summary  Patient ID: Brittany Durham MRN: [id number] DOB/AGE: May 21, 1970 48 y.o. Estimated body mass index is 34.77 kg/m as calculated from the following:   Height as of this encounter: 5\' 7"  (1.702 m).   Weight as of this encounter: 100.7 kg.   Admit date: 03/20/2019 Discharge date: 03/21/2019  Admission Diagnoses: Cervical spondylosis with stenosis C4-C6  Discharge Diagnoses: Same Active Problems:   S/P cervical spinal fusion   Discharged Condition: good  Hospital Course: Patient admitted to hospital underwent ACDF C4-5 C5-6 postoperative patient did very well recovering the floor on the floor was ambulating and voiding spontaneously tolerating regular diet stable for discharge home.  Consults:  Significant Diagnostic Studies: Treatments: ACDF C4-5 C5-6 Discharge Exam: Blood pressure (!) 157/96, pulse (!) 104, temperature 98.7 F (37.1 C), temperature source Oral, resp. rate 16, height 5\' 7"  (1.702 m), weight 100.7 kg, SpO2 94 %. Strength 5-5 wound clean dry and intact  Disposition: Home   Allergies as of 03/21/2019   No Known Allergies     Medication List    TAKE these medications   acetaminophen 500 MG tablet Commonly known as: TYLENOL Take 1,000 mg by mouth every 6 (six) hours as needed (for pain.).   amLODipine 10 MG tablet Commonly known as: NORVASC Take 1 tablet (10 mg total) by mouth daily.   cyclobenzaprine 5 MG tablet Commonly known as: FLEXERIL Take 1 tablet (5 mg total) by mouth 3 (three) times daily as needed.   lisinopril 20 MG tablet Commonly known as: ZESTRIL TAKE 1 TABLET DAILY   metoprolol tartrate 100 MG tablet Commonly known as: LOPRESSOR TAKE 1 TABLET 2 HR PRIOR TO CARDIAC PROCEDURE   naproxen sodium 550 MG tablet Commonly known as: ANAPROX Take 1 tablet (550 mg total) by mouth 2 (two) times daily with a meal.   pantoprazole 40 MG tablet Commonly known as: PROTONIX Take 1 tablet (40 mg total) by mouth daily before  breakfast.   sucralfate 1 g tablet Commonly known as: CARAFATE Take 1 g by mouth 4 (four) times daily as needed (chest pain/reflux.).   Vitamin D (Ergocalciferol) 1.25 MG (50000 UT) Caps capsule Commonly known as: DRISDOL TAKE 1 CAPSULE ONCE A WEEK What changed:   how much to take  how to take this  when to take this        Signed: Zorah Backes P 03/21/2019, 8:09 AM

## 2019-03-21 NOTE — Evaluation (Signed)
Occupational Therapy Evaluation Patient Details Name: Brittany Durham MRN: [id number] DOB: 08-01-1970 Today's Date: 03/21/2019    History of Present Illness Pt is a 48 yr old who s/p Decompressive anterior cervical discectomy C4-5 C5-6,  Anterior cervical arthrodesis C4 5 C5-6 utilizing a Titan interbody cage packed with locally harvested morcellized autologous bone graft with morselized allograft, and Anterior cervical plating C4-C6 utilizing a Medtronic plate. PMH but not limited to: asthma, DDD, knee pain, knee sx 19 and 16, stutturing, thyroid nodule, HTN    Clinical Impression   Patient is s/p see above surgery resulting in the deficits listed below (see OT Problem List). Pt today was educated about precautions as was unsure what to complete. Pt then was able to voice an understanding. She is planning to go to her mother's home then her home. She will have 24/7 assistance. Pt was recommended to have shower chair in tub/shower and long handle reacher. Patient voiced an understanding and reported hey can have at home. PT has increase tension in bilateral shoulder as fearful of pain and was educated on relaxation.      Follow Up Recommendations  No OT follow up;Supervision - Intermittent    Equipment Recommendations  Tub/shower seat    Recommendations for Other Services       Precautions / Restrictions Precautions Precautions: Cervical Precaution Booklet Issued: Yes (comment) Precaution Comments: unable to 0/3 at begining of session Restrictions Weight Bearing Restrictions: No      Mobility Bed Mobility Overal bed mobility: Needs Assistance Bed Mobility: Supine to Sit     Supine to sit: HOB elevated;Modified independent (Device/Increase time)        Transfers Overall transfer level: Needs assistance   Transfers: Sit to/from Stand Sit to Stand: Modified independent (Device/Increase time)         General transfer comment: cue to pace self and relaxation of shoulders as  noted fearful of pain to occur    Balance Overall balance assessment: Needs assistance Sitting-balance support: No upper extremity supported Sitting balance-Leahy Scale: Good     Standing balance support: No upper extremity supported                               ADL either performed or assessed with clinical judgement   ADL Overall ADL's : Needs assistance/impaired Eating/Feeding: Independent;Sitting   Grooming: Wash/dry hands;Wash/dry face;Modified independent;Standing   Upper Body Bathing: Modified independent;Sitting   Lower Body Bathing: Modified independent;Sit to/from stand   Upper Body Dressing : Modified independent;Sitting   Lower Body Dressing: Modified independent;Sit to/from stand   Toilet Transfer: Modified Independent;Grab bars   Toileting- Clothing Manipulation and Hygiene: Modified independent;Sit to/from stand   Tub/ Shower Transfer: Tub transfer;Supervision/safety;Shower seat;Grab bars   Functional mobility during ADLs: Modified independent       Vision Baseline Vision/History: Wears glasses Wears Glasses: Distance only Patient Visual Report: No change from baseline Vision Assessment?: No apparent visual deficits     Perception Perception Perception Tested?: No   Praxis Praxis Praxis tested?: Within functional limits    Pertinent Vitals/Pain Pain Assessment: 0-10 Pain Score: 0-No pain Pain Intervention(s): Limited activity within patient's tolerance     Hand Dominance Right   Extremity/Trunk Assessment Upper Extremity Assessment Upper Extremity Assessment: Overall WFL for tasks assessed   Lower Extremity Assessment Lower Extremity Assessment: Overall WFL for tasks assessed   Cervical / Trunk Assessment Cervical / Trunk Assessment: Other exceptions   Communication Communication Communication:  Other (comment)(stuttering but in [place])   Cognition Arousal/Alertness: Awake/alert Behavior During Therapy: WFL for tasks  assessed/performed Overall Cognitive Status: Within Functional Limits for tasks assessed                                     General Comments       Exercises     Shoulder Instructions      Home Living Family/patient expects to be discharged to:: Private residence Living Arrangements: Parent Available Help at Discharge: Family Type of Home: House Home Access: Stairs to enter Technical brewer of Steps: 3 Entrance Stairs-Rails: Right Home Layout: One level     Bathroom Shower/Tub: Teacher, early years/pre: Standard Bathroom Accessibility: Yes How Accessible: Accessible via walker [place] - single point;Walker - 2 wheels;Grab bars - tub/shower;Hand held shower head   Additional Comments: the home above is her mother's home where she will be living when going home      Prior Functioning/Environment Level of Independence: Independent                 OT Problem List: Decreased strength;Decreased activity tolerance;Impaired balance (sitting and/or standing);Decreased knowledge of use of DME or AE;Decreased knowledge of precautions;Pain      OT Treatment/Interventions:      OT Goals(Current goals can be found in the care plan section) Acute Rehab OT Goals Patient Stated Goal: to go to mom's home then mine OT Goal Formulation: With patient Time For Goal Achievement: 03/28/19 Potential to Achieve Goals: Good  OT Frequency:     Barriers to D/C:            Co-evaluation              AM-PAC OT "6 Clicks" Daily Activity     Outcome Measure Help from another person eating meals?: None Help from another person taking care of personal grooming?: None Help from another person toileting, which includes using toliet, bedpan, or urinal?: None Help from another person bathing (including washing, rinsing, drying)?: A Little Help from another person to put on and taking off regular upper body clothing?: None Help from another  person to put on and taking off regular lower body clothing?: None 6 Click Score: 23   End of Session Equipment Utilized During Treatment: Gait belt  Activity Tolerance: Patient tolerated treatment well Patient left: in bed;with call bell/phone within reach  OT Visit Diagnosis: Unsteadiness on feet (R26.81);Muscle weakness (generalized) (M62.81);Pain Pain - Right/Left: (none)                Time: [id number] OT Time Calculation (min): 20 min Charges:  OT General Charges $OT Visit: 1 Visit OT Evaluation $OT Eval Low Complexity: [place] OTR/L  Acute Rehab Services  [phone] office number [phone] pager number   Joeseph Amor 03/21/2019, 7:49 AM

## 2019-03-26 ENCOUNTER — Encounter (HOSPITAL_COMMUNITY): Payer: Self-pay | Admitting: Neurological Surgery

## 2019-04-04 ENCOUNTER — Other Ambulatory Visit: Payer: Self-pay | Admitting: Family

## 2019-04-14 ENCOUNTER — Encounter (HOSPITAL_COMMUNITY): Payer: Medicare Other

## 2019-04-16 ENCOUNTER — Telehealth: Payer: Self-pay | Admitting: Family

## 2019-04-16 NOTE — Chronic Care Management (AMB) (Signed)
°  Chronic Care Management   Outreach Note  04/16/2019 Name: Brittany Durham MRN: [id number] DOB: Jan 09, 1971  Referred by: Sharion Balloon, FNP Reason for referral : Chronic Care Management (Initial CCM outreach was unsuccessful.)   An unsuccessful telephone outreach was attempted today. The patient was referred to the case management team by for assistance with care management and care coordination.   Follow Up Plan: A HIPPA compliant phone message was left for the patient providing contact information and requesting a return call.  The care management team will reach out to the patient again over the next 7 days.  If patient returns call to provider office, please advise to call [facility] at [place], [employer]  [place], [place] [address] Direct Dial: [email or web address]  Website: [email or web address]

## 2019-04-20 NOTE — Chronic Care Management (AMB) (Signed)
°  Chronic Care Management   Outreach Note  04/20/2019 Name: Brittany Durham MRN: [id number] DOB: [birth year]  Referred by: Sharion Balloon, FNP Reason for referral : Chronic Care Management (Initial CCM outreach was unsuccessful.) and Chronic Care Management (Second CCM outreach was unsuccessful.)   A second unsuccessful telephone outreach was attempted today. The patient was referred to the case management team for assistance with care management and care coordination.   Follow Up Plan: A HIPPA compliant phone message was left for the patient providing contact information and requesting a return call.  The care management team will reach out to the patient again over the next 7 days.  If patient returns call to provider office, please advise to call [facility] at [place], [address]  [place],  [phone] Direct Dial: [email or web address]  Website: [email or web address]

## 2019-04-27 ENCOUNTER — Other Ambulatory Visit: Payer: Self-pay

## 2019-04-27 ENCOUNTER — Encounter: Payer: Self-pay | Admitting: Cardiology

## 2019-04-27 ENCOUNTER — Ambulatory Visit (INDEPENDENT_AMBULATORY_CARE_PROVIDER_SITE_OTHER): Payer: Medicare Other | Admitting: Cardiology

## 2019-04-27 VITALS — BP 126/80 | HR 100 | Ht 67.0 in | Wt 228.0 lb

## 2019-04-27 DIAGNOSIS — Z712 Person consulting for explanation of examination or test findings: Secondary | ICD-10-CM | POA: Diagnosis not present

## 2019-04-27 DIAGNOSIS — R072 Precordial pain: Secondary | ICD-10-CM | POA: Diagnosis not present

## 2019-04-27 DIAGNOSIS — Z7189 Other specified counseling: Secondary | ICD-10-CM

## 2019-04-27 DIAGNOSIS — I10 Essential (primary) hypertension: Secondary | ICD-10-CM | POA: Diagnosis not present

## 2019-04-27 NOTE — Chronic Care Management (AMB) (Signed)
  Chronic Care Management   Outreach Note  04/27/2019 Name: Brittany Durham MRN: [id number] DOB: 1970-05-16  Referred by: Sharion Balloon, FNP Reason for referral : Chronic Care Management (Initial CCM outreach was unsuccessful.) and Chronic Care Management (Second CCM outreach was unsuccessful.)   Third unsuccessful telephone outreach was attempted today. The patient was referred to the case management team for assistance with care management and care coordination. The patient's primary care provider has been notified of our unsuccessful attempts to make or maintain contact with the patient. The care management team is pleased to engage with this patient at any time in the future should he/she be interested in assistance from the care management team.   Follow Up Plan: The care management team is available to follow up with the patient after provider conversation with the patient regarding recommendation for care management engagement and subsequent re-referral to the care management team.   [place], Sylvia Management  [place], [place] [address] Direct Dial: [email or web address]  Website: [email or web address]

## 2019-04-27 NOTE — Patient Instructions (Signed)
Medication Instructions:  NO CHANGES *If you need a refill on your cardiac medications before your next appointment, please call your pharmacy*  Lab Work: NONE AT THIS TIME  Testing/Procedures: NONE  Follow-Up: At [facility], you and your health needs are our priority.  As part of our continuing mission to provide you with exceptional heart care, we have created designated Provider Care Teams.  These Care Teams include your primary Cardiologist (physician) and Advanced Practice Providers (APPs -  Physician Assistants and Nurse Practitioners) who all work together to provide you with the care you need, when you need it.  Your next appointment:   1 year(s)  The format for your next appointment:   Either In Person or Virtual  Provider:   Buford Dresser, MD  Other Instructions

## 2019-04-27 NOTE — Progress Notes (Signed)
Cardiology Office Note:    Date:  04/27/2019   ID:  Brittany Durham, DOB 1971-04-14, MRN [id number]  PCP:  Sharion Balloon, FNP  Cardiologist:  Buford Dresser, MD PhD  Referring MD: Sharion Balloon, FNP   CC: follow up  History of Present Illness:    Brittany Durham is a 48 y.o. female with a hx of hypertension, speech disorder who is seen for follow up. I initially saw her 01/29/19 as a new consult at the request of Sharion Balloon, FNP for the evaluation and management of chest pain.  Cardiac history:  [name] has an undefined speech issue at baseline. Has been seen in [facility] ER 01/06/19 for chest pain. Had gone frequently in the past but had not been in some time. That event was the result of gradually increasing central, sharp chest pain, worse with deep breathing, that became so severe that she sought emergency medical attention. At that visit, HsTn 6-> 7, not consistent with ACS. D-dimer normal. CXR without acute changes. ECG was sinus tach. She improved with toradal, ativan, and fluids.  Chest pain history: -Initial onset: ~10 years ago -Quality: sharp, central, nonradiating, sometimes pleuritic -Frequency: had two years between event in August and prior event. -Associated symptoms: short of breath, nauseated, dizzy. Distant syncope but none recently (2-3 years ago) -Aggravating/alleviating factors: better when she sleeps, better with sucralfate but then comes back -Prior cardiac history: no MI. 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 Has never seen neurologist.  Today: She had done well for the last month with no chest pain, and then she had a severe episode yesterday. Not related to food or exertion. Across her entire chest. Lasted several hours, better after she laid down and rested for a while. Similar to her prior episodes.  We reviewed the results of her nuclear stress test, which were  reassuring. She feels better knowing this but does not know what to do about the pain when it comes. We discussed non-cardiac etiologies and follow up with her PCP.  Denies shortness of breath at rest or with normal exertion. No PND, orthopnea, LE edema or unexpected weight gain. No syncope or palpitations.  Past Medical History:  Diagnosis Date  . Asthma    childhood  . Carotidynia   . DDD (degenerative disc disease), cervical   . Hypertension   . Knee pain    left meniscus tear s/p repair 11/2014  . Seizures ([place])   . Stuttering    patient reports this started after her knee surgery in 11/2014.   Marland Kitchen Thyroid nodule     Past Surgical History:  Procedure Laterality Date  . ANTERIOR CERVICAL DECOMP/DISCECTOMY FUSION N/A 03/20/2019   Procedure: Anterior Cervical Decompression Fusion - Cervical Four-Cervical Five - Cervical Five-Cervical Six;  Surgeon: Eustace Moore, MD;  Location: [facility];  Service: Neurosurgery;  Laterality: N/A;  Anterior Cervical Decompression Fusion - Cervical Four-Cervical Five - Cervical Five-Cervical Six  . COLONOSCOPY N/A 08/23/2016   Procedure: COLONOSCOPY;  Surgeon: Daneil Dolin, MD;  [facility];  Service: Endoscopy;  Laterality: N/A;  1:00pm  . ESOPHAGOGASTRODUODENOSCOPY N/A 05/03/2016   Procedure: ESOPHAGOGASTRODUODENOSCOPY (EGD);  Surgeon: Daneil Dolin, MD;  [facility];  Service: Endoscopy;  Laterality: N/A;  . ESOPHAGOGASTRODUODENOSCOPY N/A 08/23/2016   Procedure: ESOPHAGOGASTRODUODENOSCOPY (EGD);  Surgeon: Daneil Dolin, MD;  [facility];  Service: Endoscopy;  Laterality: N/A;  .  KNEE ARTHROSCOPY WITH MEDIAL MENISECTOMY Left 12/09/2014   Procedure: KNEE ARTHROSCOPY WITH PARTIAL MEDIAL AND LATERAL MENISECTOMY;  Surgeon: Sanjuana Kava, MD;  Location: [facility];  Service: Orthopedics;  Laterality: Left;  . TUBAL LIGATION    . WISDOM TOOTH EXTRACTION      Current Medications: Current Outpatient Medications on File Prior to  Visit  Medication Sig  . acetaminophen (TYLENOL) 500 MG tablet Take 1,000 mg by mouth every 6 (six) hours as needed (for pain.).  Marland Kitchen amLODipine (NORVASC) 10 MG tablet Take 1 tablet (10 mg total) by mouth daily.  . cyclobenzaprine (FLEXERIL) 5 MG tablet Take 1 tablet (5 mg total) by mouth 3 (three) times daily as needed. (Patient not taking: Reported on 03/10/2019)  . lisinopril (ZESTRIL) 20 MG tablet Take 1 tablet (20 mg total) by mouth daily. (Needs to be seen before next refill)  . metoprolol tartrate (LOPRESSOR) 100 MG tablet TAKE 1 TABLET 2 HR PRIOR TO CARDIAC PROCEDURE (Patient not taking: Reported on 03/10/2019)  . naproxen sodium (ANAPROX) 550 MG tablet Take 1 tablet (550 mg total) by mouth 2 (two) times daily with a meal. (Patient not taking: Reported on 03/10/2019)  . pantoprazole (PROTONIX) 40 MG tablet Take 1 tablet (40 mg total) by mouth daily before breakfast.  . sucralfate (CARAFATE) 1 g tablet Take 1 g by mouth 4 (four) times daily as needed (chest pain/reflux.).   Marland Kitchen Vitamin D, Ergocalciferol, (DRISDOL) 1.25 MG (50000 UT) CAPS capsule TAKE 1 CAPSULE ONCE A WEEK (Patient taking differently: Take 50,000 Units by mouth every Thursday. TAKE 1 CAPSULE ONCE A WEEK)   No current facility-administered medications on file prior to visit.     Allergies:   Patient has no known allergies.   Social History   Tobacco Use  . Smoking status: Never Smoker  . Smokeless tobacco: Never Used  Substance Use Topics  . Alcohol use: No  . Drug use: No    Family History: The patient's family history includes Hypertension in her brother and mother; Lung cancer in her father. There is no history of Colon cancer, Gastric cancer, or Esophageal cancer.  ROS:   Please see the history of present illness.  Additional pertinent ROS: Constitutional: Negative for chills, fever, night sweats, unintentional weight loss  HENT: Negative for ear pain and hearing loss.   Eyes: Negative for loss of vision and  eye pain.  Respiratory: Negative for cough, sputum, wheezing.   Cardiovascular: See HPI. Gastrointestinal: Negative for abdominal pain, melena, and hematochezia.  Genitourinary: Negative for dysuria and hematuria.  Musculoskeletal: Negative for falls and myalgias.  Skin: Negative for itching and rash.  Neurological: Negative for focal weakness, focal sensory changes and loss of consciousness.  Endo/Heme/Allergies: Does not bruise/bleed easily.   EKGs/Labs/Other Studies Reviewed:    The following studies were reviewed today: Lexiscan 03/19/19  Nuclear stress EF: 51%. Visually, the EF appears to be greater than the computer calculated 51% and appears normal  There was no ST segment deviation noted during stress.  This is a low risk study. There is no evidence of ischemia or previous infarction  The study is normal  Echo 2018, 2016 personally reviewed  EKG:  EKG is personally reviewed.  The ekg ordered today demonstrates sinus tachycardia at 101 bpm  Recent Labs: 06/30/2018: TSH 2.660 03/17/2019: BUN 11; Creatinine, Ser 0.83; Hemoglobin 13.0; Platelets 264; Potassium 4.1; Sodium 140  Recent Lipid Panel    Component Value Date/Time   CHOL 196 01/29/2019 1039   TRIG  93 01/29/2019 1039   HDL 58 01/29/2019 1039   CHOLHDL 3.4 01/29/2019 1039   CHOLHDL 2.9 12/13/2014 0613   VLDL 10 12/13/2014 0613   LDLCALC 121 (H) 01/29/2019 1039    Physical Exam:    VS:  BP 126/80   Pulse 100   Ht 5\' 7"  (1.702 m)   Wt 228 lb (103.4 kg)   SpO2 97%   BMI 35.71 kg/m     Wt Readings from Last 3 Encounters:  04/27/19 228 lb (103.4 kg)  03/20/19 222 lb 0.1 oz (100.7 kg)  03/19/19 222 lb (100.7 kg)    GEN: Well nourished, well developed in no acute distress HEENT: Normal, moist mucous membranes NECK: No JVD CARDIAC: regular rhythm, normal S1 and S2, no rubs or gallops. No murmur. VASCULAR: Radial and DP pulses 2+ bilaterally. No carotid bruits RESPIRATORY:  Clear to auscultation without  rales, wheezing or rhonchi  ABDOMEN: Soft, non-tender, non-distended MUSCULOSKELETAL:  Ambulates independently SKIN: Warm and dry, no edema NEUROLOGIC:  Alert and oriented x 3. No focal neuro deficits noted beyond chronic speech changes. PSYCHIATRIC:  Normal affect   ASSESSMENT:    1. Precordial pain   2. Cardiac risk counseling   3. Counseling on health promotion and disease prevention   4. Essential hypertension   5. Encounter to discuss test results    PLAN:    Precordial pain: stress test reassuring, discussed test results together today. This suggests that pain is not cardiac in nature. We discussed this today. She is relieved by this but wonders what else might be causing the pain. Discussed that there are many other etiologies, including MSK, neurologic, pleuritic, GI, etc. Have improved with toradol, carafate, and nitroglycerin. Instructed on red flag signs that need immediate medical attention.   Hypertension; at goal today.  -continue amlodipine, lisinopril  Cardiac risk counseling and prevention recommendations: -recommend heart healthy/Mediterranean diet, with whole grains, fruits, vegetable, fish, lean meats, nuts, and olive oil. Limit salt. -recommend moderate walking, 3-5 times/week for 30-50 minutes each session. Aim for at least 150 minutes.week. Goal should be pace of 3 miles/hours, or walking 1.5 miles in 30 minutes -recommend avoidance of tobacco products. Avoid excess alcohol. -ASCVD risk score: The 10-year ASCVD risk score Mikey Bussing DC Brooke Bonito., et al., 2013) is: 7.1%   Values used to calculate the score:     Age: 41 years     Sex: Female     Is Non-Hispanic African American: Yes     Diabetic: No     Tobacco smoker: No     Systolic Blood Pressure: [id number] mmHg     Is BP treated: Yes     HDL Cholesterol: 58 mg/dL     Total Cholesterol: 196 mg/dL    Plan for follow up: 12 months or sooner PRN  Medication Adjustments/Labs and Tests Ordered: Current medicines are  reviewed at length with the patient today.  Concerns regarding medicines are outlined above.  No orders of the defined types were placed in this encounter.  No orders of the defined types were placed in this encounter.   Patient Instructions  Medication Instructions:  NO CHANGES *If you need a refill on your cardiac medications before your next appointment, please call your pharmacy*  Lab Work: NONE AT THIS TIME  Testing/Procedures: NONE  Follow-Up: At [facility], you and your health needs are our priority.  As part of our continuing mission to provide you with exceptional heart care, we have created designated Provider Care Teams.  These Care Teams include your primary Cardiologist (physician) and Advanced Practice Providers (APPs -  Physician Assistants and Nurse Practitioners) who all work together to provide you with the care you need, when you need it.  Your next appointment:   1 year(s)  The format for your next appointment:   Either In Person or Virtual  Provider:   Buford Dresser, MD  Other Instructions     Signed, Buford Dresser, MD PhD 04/27/2019   [place]

## 2019-04-28 ENCOUNTER — Other Ambulatory Visit: Payer: Self-pay

## 2019-04-29 ENCOUNTER — Encounter: Payer: Self-pay | Admitting: Family

## 2019-04-29 ENCOUNTER — Ambulatory Visit (INDEPENDENT_AMBULATORY_CARE_PROVIDER_SITE_OTHER): Payer: Medicare Other | Admitting: Family

## 2019-04-29 VITALS — BP 122/87 | HR 97 | Temp 97.3°F | Ht 67.0 in | Wt 223.0 lb

## 2019-04-29 DIAGNOSIS — R072 Precordial pain: Secondary | ICD-10-CM

## 2019-04-29 MED ORDER — BACLOFEN 10 MG PO TABS: 10.0000 mg | ORAL_TABLET | Freq: Three times a day (TID) | ORAL | 0 refills | Status: DC

## 2019-04-29 MED ORDER — ESCITALOPRAM OXALATE 5 MG PO TABS: 5.0000 mg | ORAL_TABLET | Freq: Every day | ORAL | 1 refills | Status: DC

## 2019-04-29 NOTE — Patient Instructions (Signed)

## 2019-04-29 NOTE — Progress Notes (Signed)
Subjective:    Patient ID: Brittany Durham, female    DOB: August 02, 1970, 48 y.o.   MRN: [id number]  Chief Complaint  Patient presents with  . Hospitalization Follow-up   PT presents to the office today with chest pain. She saw her Cardiologists on 04/27/19 for precordial pain. She had a negative stress test and told to follow up with her PCP for non-cardiac etiologies.  She states this pain comes and goes for the last few years. She states can think of any triggers and not related to food, activity, stress, or positions. She has had these episodes when sitting on the couch, standing, morning, and evening. She states she has taken tylenol with no relief, but after 24 hours resolves.  Denies any anxiety or SOB.  Chest Pain  This is a recurrent problem. The current episode started yesterday. The onset quality is sudden. The problem occurs intermittently. The problem has been waxing and waning. The pain is present in the substernal region. The pain is at a severity of 10/10. The pain is moderate. The quality of the pain is described as pressure. The pain does not radiate. Associated symptoms include malaise/fatigue and palpitations. Pertinent negatives include no abdominal pain, back pain, cough, diaphoresis, dizziness, fever, irregular heartbeat, lower extremity edema, shortness of breath or sputum production. She has tried NSAIDs and acetaminophen for the symptoms. The treatment provided no relief.      Review of Systems  Constitutional: Positive for malaise/fatigue. Negative for diaphoresis and fever.  Respiratory: Negative for cough, sputum production and shortness of breath.   Cardiovascular: Positive for chest pain and palpitations.  Gastrointestinal: Negative for abdominal pain.  Musculoskeletal: Negative for back pain.  Neurological: Negative for dizziness.       Objective:   Physical Exam Vitals reviewed.  Constitutional:      General: She is not in acute distress.    Appearance:  She is well-developed.  HENT:     Head: Normocephalic and atraumatic.     Right Ear: External ear normal.  Eyes:     Pupils: Pupils are equal, round, and reactive to light.  Neck:     Thyroid: No thyromegaly.  Cardiovascular:     Rate and Rhythm: Normal rate and regular rhythm.     Heart sounds: Normal heart sounds. No murmur.  Pulmonary:     Effort: Pulmonary effort is normal. No respiratory distress.     Breath sounds: Normal breath sounds. No wheezing.  Abdominal:     General: Bowel sounds are normal. There is no distension.     Palpations: Abdomen is soft.     Tenderness: There is no abdominal tenderness.  Musculoskeletal:        General: No tenderness. Normal range of motion.     Cervical back: Normal range of motion and neck supple.  Skin:    General: Skin is warm and dry.  Neurological:     Mental Status: She is alert and oriented to person, place, and time.     Cranial Nerves: No cranial nerve deficit.     Deep Tendon Reflexes: Reflexes are normal and symmetric.  Psychiatric:        Behavior: Behavior normal.        Thought Content: Thought content normal.        Judgment: Judgment normal.      BP 122/87   Pulse 97   Temp (!) 97.3 F (36.3 C) (Temporal)   Ht 5\' 7"  (1.702 m)   Wt 223  lb (101.2 kg)   SpO2 99%   BMI 34.93 kg/m       Assessment & Plan:  Brittany Durham comes in today with chief complaint of Hospitalization Follow-up   Diagnosis and orders addressed:  1. Precordial pain More than likely related to GAD, but could be muscle spasm. Will start low dose lexapro.  If pain develops can use baclofen to see if this helps with pain.  Stress management  If chest pain worsen with SOB go to ED - baclofen (LIORESAL) 10 MG tablet; Take 1 tablet (10 mg total) by mouth 3 (three) times daily.  Dispense: 30 each; Refill: 0 - escitalopram (LEXAPRO) 5 MG tablet; Take 1 tablet (5 mg total) by mouth daily.  Dispense: 90 tablet; Refill: [place],  FNP

## 2019-04-30 ENCOUNTER — Ambulatory Visit: Payer: Medicare Other | Admitting: Cardiology

## 2019-06-12 ENCOUNTER — Other Ambulatory Visit: Payer: Self-pay

## 2019-06-15 ENCOUNTER — Ambulatory Visit (INDEPENDENT_AMBULATORY_CARE_PROVIDER_SITE_OTHER): Payer: Medicare Other | Admitting: Family

## 2019-06-15 ENCOUNTER — Encounter: Payer: Self-pay | Admitting: Family

## 2019-06-15 ENCOUNTER — Other Ambulatory Visit: Payer: Self-pay

## 2019-06-15 VITALS — BP 140/85 | HR 105 | Temp 96.6°F | Ht 67.0 in | Wt 218.6 lb

## 2019-06-15 DIAGNOSIS — R072 Precordial pain: Secondary | ICD-10-CM | POA: Diagnosis not present

## 2019-06-15 DIAGNOSIS — F411 Generalized anxiety disorder: Secondary | ICD-10-CM | POA: Diagnosis not present

## 2019-06-15 DIAGNOSIS — R0789 Other chest pain: Secondary | ICD-10-CM | POA: Diagnosis not present

## 2019-06-15 MED ORDER — BACLOFEN 10 MG PO TABS: 10.0000 mg | ORAL_TABLET | Freq: Three times a day (TID) | ORAL | 3 refills | Status: DC

## 2019-06-15 MED ORDER — ESCITALOPRAM OXALATE 10 MG PO TABS: 10.0000 mg | ORAL_TABLET | Freq: Every day | ORAL | 3 refills | Status: DC

## 2019-06-15 NOTE — Patient Instructions (Signed)

## 2019-06-15 NOTE — Progress Notes (Signed)
Subjective:    Patient ID: Brittany Durham, female    DOB: 06-11-1970, 49 y.o.   MRN: [id number]  Chief Complaint  Patient presents with  . Follow-up    6 week follow up on chest pain lexapro and baclofen has helped    Pt presents to the office today to recheck intermittent chest pain. She was seen on 12/16/2 and started on lexapro and baclofen. She had been to the ED for chest pain work up that was negative.   She reports since starting the baclofen and lexapro her chest pain has resolved.  Anxiety Presents for follow-up visit. Symptoms include chest pain, decreased concentration, depressed mood, excessive worry, irritability, nervous/anxious behavior and restlessness. Symptoms occur occasionally. The severity of symptoms is moderate.    Chest Pain  The problem has been resolved. The pain is at a severity of 0/10. The patient is experiencing no pain.      Review of Systems  Constitutional: Positive for irritability.  Cardiovascular: Positive for chest pain.  Psychiatric/Behavioral: Positive for decreased concentration. The patient is nervous/anxious.   All other systems reviewed and are negative.      Objective:   Physical Exam Vitals reviewed.  Constitutional:      General: She is not in acute distress.    Appearance: She is well-developed.  HENT:     Head: Normocephalic and atraumatic.     Right Ear: Tympanic membrane normal.     Left Ear: Tympanic membrane normal.  Eyes:     Pupils: Pupils are equal, round, and reactive to light.  Neck:     Thyroid: No thyromegaly.  Cardiovascular:     Rate and Rhythm: Normal rate and regular rhythm.     Heart sounds: Normal heart sounds. No murmur.  Pulmonary:     Effort: Pulmonary effort is normal. No respiratory distress.     Breath sounds: Normal breath sounds. No wheezing.  Abdominal:     General: Bowel sounds are normal. There is no distension.     Palpations: Abdomen is soft.     Tenderness: There is no abdominal  tenderness.  Musculoskeletal:        General: No tenderness. Normal range of motion.     Cervical back: Normal range of motion and neck supple.  Skin:    General: Skin is warm and dry.  Neurological:     Mental Status: She is alert and oriented to person, place, and time.     Cranial Nerves: No cranial nerve deficit.     Deep Tendon Reflexes: Reflexes are normal and symmetric.  Psychiatric:        Behavior: Behavior normal.        Thought Content: Thought content normal.        Judgment: Judgment normal.     BP 140/85   Pulse (!) 105   Temp (!) 96.6 F (35.9 C) (Temporal)   Ht 5\' 7"  (1.702 m)   Wt 218 lb 9.6 oz (99.2 kg)   SpO2 95%   BMI 34.24 kg/m        Assessment & Plan:  Brittany Durham comes in today with chief complaint of Follow-up (6 week follow up on chest pain lexapro and baclofen has helped )   Diagnosis and orders addressed:  1. GAD (generalized anxiety disorder) Will increase lexapro to 10 mg from 5 mg Stress management  RTO in 6 weeks  2. Other chest pain Resolved Will refill baclofen as needed - baclofen (LIORESAL) 10 MG tablet;  Take 1 tablet (10 mg total) by mouth 3 (three) times daily.  Dispense: 90 each; Refill: 3  3. Precordial pain - baclofen (LIORESAL) 10 MG tablet; Take 1 tablet (10 mg total) by mouth 3 (three) times daily.  Dispense: 90 each; Refill: [place], FNP

## 2019-07-02 ENCOUNTER — Other Ambulatory Visit: Payer: Self-pay | Admitting: Family

## 2019-07-02 DIAGNOSIS — I10 Essential (primary) hypertension: Secondary | ICD-10-CM

## 2019-07-16 DIAGNOSIS — M5412 Radiculopathy, cervical region: Secondary | ICD-10-CM | POA: Insufficient documentation

## 2019-07-16 DIAGNOSIS — R030 Elevated blood-pressure reading, without diagnosis of hypertension: Secondary | ICD-10-CM | POA: Insufficient documentation

## 2019-07-25 ENCOUNTER — Ambulatory Visit: Payer: Medicare Other | Attending: Internal Medicine

## 2019-07-25 ENCOUNTER — Other Ambulatory Visit: Payer: Self-pay

## 2019-07-25 DIAGNOSIS — Z23 Encounter for immunization: Secondary | ICD-10-CM

## 2019-07-25 NOTE — Progress Notes (Signed)
   Covid-19 Vaccination Clinic  Name:  Brittany Durham    MRN: [id number] DOB: [birth year]  07/25/2019  [name] was observed post Covid-19 immunization for 15 minutes without incident. She was provided with Vaccine Information Sheet and instruction to access the V-Safe system.   [name] was instructed to call 911 with any severe reactions post vaccine: Marland Kitchen Difficulty breathing  . Swelling of face and throat  . A fast heartbeat  . A bad rash all over body  . Dizziness and weakness   Immunizations Administered    Name Date Dose VIS Date Route   Moderna COVID-19 Vaccine 07/25/2019 11:16 AM 0.5 mL 04/14/2019 Intramuscular   Manufacturer: Moderna   Lot: [id number]   [id number]

## 2019-07-28 ENCOUNTER — Ambulatory Visit (INDEPENDENT_AMBULATORY_CARE_PROVIDER_SITE_OTHER): Payer: Medicare Other | Admitting: Family

## 2019-07-28 ENCOUNTER — Encounter: Payer: Self-pay | Admitting: Family

## 2019-07-28 DIAGNOSIS — F411 Generalized anxiety disorder: Secondary | ICD-10-CM | POA: Diagnosis not present

## 2019-07-28 NOTE — Progress Notes (Signed)
   Virtual Visit via telephone Note Due to COVID-19 pandemic this visit was conducted virtually. This visit type was conducted due to national recommendations for restrictions regarding the COVID-19 Pandemic (e.g. social distancing, sheltering in place) in an effort to limit this patient's exposure and mitigate transmission in our community. All issues noted in this document were discussed and addressed.  A physical exam was not performed with this format.  I connected with Brittany Durham on 07/28/19 at 12:01 pm  by telephone and verified that I am speaking with the correct person using two identifiers. Brittany Durham is currently located at home and grandson is currently with her during visit. The provider, Evelina Dun, FNP is located in their office at time of visit.  I discussed the limitations, risks, security and privacy concerns of performing an evaluation and management service by telephone and the availability of in person appointments. I also discussed with the patient that there may be a patient responsible charge related to this service. The patient expressed understanding and agreed to proceed.   History and Present Illness:  Pt calls the office today to recheck GAD. We increased her Lexapro 5 mg to 10 mg and reports she is doing really well now.  Anxiety Presents for follow-up visit. Symptoms include depressed mood, excessive worry, irritability, nervous/anxious behavior and restlessness. Symptoms occur occasionally. The severity of symptoms is moderate. The quality of sleep is good.        Review of Systems  Constitutional: Positive for irritability.  Psychiatric/Behavioral: The patient is nervous/anxious.      Observations/Objective: No SOB or distress noted, pt stutters  Assessment and Plan: 1. GAD (generalized anxiety disorder) Continue Lexapro 10 mg Stress management  Call if symptoms worsen or do not improve      I discussed the assessment and treatment plan  with the patient. The patient was provided an opportunity to ask questions and all were answered. The patient agreed with the plan and demonstrated an understanding of the instructions.   The patient was advised to call back or seek an in-person evaluation if the symptoms worsen or if the condition fails to improve as anticipated.  The above assessment and management plan was discussed with the patient. The patient verbalized understanding of and has agreed to the management plan. Patient is aware to call the clinic if symptoms persist or worsen. Patient is aware when to return to the clinic for a follow-up visit. Patient educated on when it is appropriate to go to the emergency department.   Time call ended: 12:11 pm    I provided 10 minutes of non-face-to-face time during this encounter.    Evelina Dun, FNP

## 2019-08-21 ENCOUNTER — Other Ambulatory Visit: Payer: Self-pay | Admitting: Family

## 2019-08-25 ENCOUNTER — Encounter: Payer: Self-pay | Admitting: Family

## 2019-08-25 ENCOUNTER — Ambulatory Visit (INDEPENDENT_AMBULATORY_CARE_PROVIDER_SITE_OTHER): Payer: Medicare Other | Admitting: Family

## 2019-08-25 DIAGNOSIS — I10 Essential (primary) hypertension: Secondary | ICD-10-CM | POA: Diagnosis not present

## 2019-08-25 DIAGNOSIS — F411 Generalized anxiety disorder: Secondary | ICD-10-CM | POA: Diagnosis not present

## 2019-08-25 DIAGNOSIS — M159 Polyosteoarthritis, unspecified: Secondary | ICD-10-CM

## 2019-08-25 DIAGNOSIS — R479 Unspecified speech disturbances: Secondary | ICD-10-CM

## 2019-08-25 DIAGNOSIS — K219 Gastro-esophageal reflux disease without esophagitis: Secondary | ICD-10-CM | POA: Diagnosis not present

## 2019-08-25 DIAGNOSIS — M8949 Other hypertrophic osteoarthropathy, multiple sites: Secondary | ICD-10-CM | POA: Diagnosis not present

## 2019-08-25 DIAGNOSIS — E559 Vitamin D deficiency, unspecified: Secondary | ICD-10-CM | POA: Diagnosis not present

## 2019-08-25 NOTE — Progress Notes (Signed)
 Virtual Visit via telephone Note Due to COVID-19 pandemic this visit was conducted virtually. This visit type was conducted due to national recommendations for restrictions regarding the COVID-19 Pandemic (e.g. social distancing, sheltering in place) in an effort to limit this patient's exposure and mitigate transmission in our community. All issues noted in this document were discussed and addressed.  A physical exam was not performed with this format.  I connected with Orlanda Ploch on 08/25/19 at 2:48 pm by telephone and verified that I am speaking with the correct person using two identifiers. Jaclyn Mcphatter is currently located at home and mother is currently with her during visit. The provider,  , FNP is located in their office at time of visit.  I discussed the limitations, risks, security and privacy concerns of performing an evaluation and management service by telephone and the availability of in person appointments. I also discussed with the patient that there may be a patient responsible charge related to this service. The patient expressed understanding and agreed to proceed.   History and Present Illness:  PT presents to the office today forchronic follow up. Pt is followed by Neurologists for Speech difficulties. She is followed by GI every 6 months for GERD.  Hypertension This is a chronic problem. The current episode started more than 1 year ago. The problem has been resolved since onset. The problem is controlled. Associated symptoms include anxiety. Pertinent negatives include no malaise/fatigue, peripheral edema or shortness of breath. Risk factors for coronary artery disease include obesity and dyslipidemia. The current treatment provides moderate improvement. There is no history of CVA or heart failure.  Gastroesophageal Reflux She complains of belching and heartburn. This is a chronic problem. The current episode started more than 1 year ago. The problem occurs  occasionally. The problem has been waxing and waning. The heartburn is of mild intensity. The symptoms are aggravated by certain foods. The treatment provided moderate relief.  Arthritis Presents for follow-up visit. She complains of pain. The symptoms have been stable. Affected location: back. Her pain is at a severity of 0/10 (with medication).  Anxiety Presents for follow-up visit. Symptoms include decreased concentration, excessive worry and irritability. Patient reports no shortness of breath. Symptoms occur occasionally. The severity of symptoms is moderate. The quality of sleep is good.        Review of Systems  Constitutional: Positive for irritability. Negative for malaise/fatigue.  Respiratory: Negative for shortness of breath.   Gastrointestinal: Positive for heartburn.  Musculoskeletal: Positive for arthritis.  Psychiatric/Behavioral: Positive for decreased concentration.  All other systems reviewed and are negative.    Observations/Objective: No SOB or distress noted   Assessment and Plan: Sharmel Marzec comes in today with chief complaint of No chief complaint on file.   Diagnosis and orders addressed:  1. HYPERTENSION, BENIGN - CBC with Differential/Platelet; Future - CMP14+EGFR; Future  2. Gastroesophageal reflux disease without esophagitis - CBC with Differential/Platelet; Future - CMP14+EGFR; Future  3. Primary osteoarthritis involving multiple joints - CBC with Differential/Platelet; Future - CMP14+EGFR; Future  4. GAD (generalized anxiety disorder) - CBC with Differential/Platelet; Future - CMP14+EGFR; Future  5. Speech disorder - CBC with Differential/Platelet; Future - CMP14+EGFR; Future  6. Vitamin D deficiency - CBC with Differential/Platelet; Future - CMP14+EGFR; Future - VITAMIN D 25 Hydroxy (Vit-D Deficiency, Fractures); Future   Labs pending Health Maintenance reviewed Diet and exercise encouraged  Follow up plan: 6 months       I discussed the assessment and treatment plan with the   patient. The patient was provided an opportunity to ask questions and all were answered. The patient agreed with the plan and demonstrated an understanding of the instructions.   The patient was advised to call back or seek an in-person evaluation if the symptoms worsen or if the condition fails to improve as anticipated.  The above assessment and management plan was discussed with the patient. The patient verbalized understanding of and has agreed to the management plan. Patient is aware to call the clinic if symptoms persist or worsen. Patient is aware when to return to the clinic for a follow-up visit. Patient educated on when it is appropriate to go to the emergency department.   Time call ended:  3:04 pm  I provided 16 minutes of non-face-to-face time during this encounter.    Evelina Dun, FNP

## 2019-08-26 ENCOUNTER — Ambulatory Visit: Payer: Medicare Other | Attending: Internal Medicine

## 2019-08-26 DIAGNOSIS — Z23 Encounter for immunization: Secondary | ICD-10-CM

## 2019-08-26 NOTE — Progress Notes (Signed)
   Covid-19 Vaccination Clinic  Name:  Brittany Durham    MRN: [id number] DOB: [birth year]  08/26/2019  Brittany Durham was observed post Covid-19 immunization for 15 minutes without incident. She was provided with Vaccine Information Sheet and instruction to access the V-Safe system.   Brittany Durham was instructed to call 911 with any severe reactions post vaccine: Marland Kitchen Difficulty breathing  . Swelling of face and throat  . A fast heartbeat  . A bad rash all over body  . Dizziness and weakness   Immunizations Administered    Name Date Dose VIS Date Route   Moderna COVID-19 Vaccine 08/26/2019 10:37 AM 0.5 mL 04/14/2019 Intramuscular   Manufacturer: Moderna   Lot: [id number]   [id number]

## 2019-08-27 ENCOUNTER — Other Ambulatory Visit: Payer: Self-pay

## 2019-08-27 ENCOUNTER — Other Ambulatory Visit: Payer: Medicare Other

## 2019-08-27 DIAGNOSIS — F411 Generalized anxiety disorder: Secondary | ICD-10-CM | POA: Diagnosis not present

## 2019-08-27 DIAGNOSIS — I10 Essential (primary) hypertension: Secondary | ICD-10-CM

## 2019-08-27 DIAGNOSIS — R479 Unspecified speech disturbances: Secondary | ICD-10-CM

## 2019-08-27 DIAGNOSIS — M159 Polyosteoarthritis, unspecified: Secondary | ICD-10-CM

## 2019-08-27 DIAGNOSIS — M8949 Other hypertrophic osteoarthropathy, multiple sites: Secondary | ICD-10-CM

## 2019-08-27 DIAGNOSIS — K219 Gastro-esophageal reflux disease without esophagitis: Secondary | ICD-10-CM

## 2019-08-27 DIAGNOSIS — E559 Vitamin D deficiency, unspecified: Secondary | ICD-10-CM | POA: Diagnosis not present

## 2019-08-28 ENCOUNTER — Other Ambulatory Visit: Payer: Self-pay | Admitting: Family

## 2019-08-28 LAB — CMP14+EGFR
ALT: 21 IU/L (ref 0–32)
AST: 20 IU/L (ref 0–40)
Albumin/Globulin Ratio: 1.6 (ref 1.2–2.2)
Albumin: 4.6 g/dL (ref 3.8–4.8)
Alkaline Phosphatase: 129 IU/L — ABNORMAL HIGH (ref 39–117)
BUN/Creatinine Ratio: 12 (ref 9–23)
BUN: 8 mg/dL (ref 6–24)
Bilirubin Total: 0.4 mg/dL (ref 0.0–1.2)
CO2: 25 mmol/L (ref 20–29)
Calcium: 9.9 mg/dL (ref 8.7–10.2)
Chloride: 101 mmol/L (ref 96–106)
Creatinine, Ser: 0.65 mg/dL (ref 0.57–1.00)
GFR calc Af Amer: 120 mL/min/{1.73_m2} (ref >59)
GFR calc non Af Amer: 104 mL/min/{1.73_m2} (ref >59)
Globulin, Total: 2.9 g/dL (ref 1.5–4.5)
Glucose: 81 mg/dL (ref 65–99)
Potassium: 4.7 mmol/L (ref 3.5–5.2)
Sodium: 142 mmol/L (ref 134–144)
Total Protein: 7.5 g/dL (ref 6.0–8.5)

## 2019-08-28 LAB — CBC WITH DIFFERENTIAL/PLATELET
Basophils Absolute: 0 10*3/uL (ref 0.0–0.2)
Basos: 0 %
EOS (ABSOLUTE): 0.3 10*3/uL (ref 0.0–0.4)
Eos: 5 %
Hematocrit: 35.7 % (ref 34.0–46.6)
Hemoglobin: 11.5 g/dL (ref 11.1–15.9)
Immature Grans (Abs): 0 10*3/uL (ref 0.0–0.1)
Immature Granulocytes: 0 %
Lymphocytes Absolute: 1.7 10*3/uL (ref 0.7–3.1)
Lymphs: 23 %
MCH: 27.9 pg (ref 26.6–33.0)
MCHC: 32.2 g/dL (ref 31.5–35.7)
MCV: 87 fL (ref 79–97)
Monocytes Absolute: 0.6 10*3/uL (ref 0.1–0.9)
Monocytes: 8 %
Neutrophils Absolute: 4.7 10*3/uL (ref 1.4–7.0)
Neutrophils: 64 %
Platelets: 242 10*3/uL (ref 150–450)
RBC: 4.12 x10E6/uL (ref 3.77–5.28)
RDW: 13.1 % (ref 11.7–15.4)
WBC: 7.4 10*3/uL (ref 3.4–10.8)

## 2019-08-28 LAB — VITAMIN D 25 HYDROXY (VIT D DEFICIENCY, FRACTURES): Vit D, 25-Hydroxy: 44.3 ng/mL (ref 30.0–100.0)

## 2019-09-03 ENCOUNTER — Ambulatory Visit: Payer: Medicare Other | Admitting: Nurse Practitioner

## 2019-09-14 ENCOUNTER — Other Ambulatory Visit: Payer: Self-pay | Admitting: Family

## 2019-09-14 DIAGNOSIS — K279 Peptic ulcer, site unspecified, unspecified as acute or chronic, without hemorrhage or perforation: Secondary | ICD-10-CM

## 2019-09-16 DIAGNOSIS — M79672 Pain in left foot: Secondary | ICD-10-CM | POA: Diagnosis not present

## 2019-09-28 ENCOUNTER — Encounter: Payer: Self-pay | Admitting: Nurse Practitioner

## 2019-09-28 ENCOUNTER — Other Ambulatory Visit: Payer: Self-pay

## 2019-09-28 ENCOUNTER — Ambulatory Visit (INDEPENDENT_AMBULATORY_CARE_PROVIDER_SITE_OTHER): Payer: Medicare Other | Admitting: Nurse Practitioner

## 2019-09-28 VITALS — BP 126/77 | HR 84 | Temp 96.9°F | Ht 67.0 in | Wt 220.8 lb

## 2019-09-28 DIAGNOSIS — K219 Gastro-esophageal reflux disease without esophagitis: Secondary | ICD-10-CM

## 2019-09-28 DIAGNOSIS — R197 Diarrhea, unspecified: Secondary | ICD-10-CM | POA: Diagnosis not present

## 2019-09-28 NOTE — Progress Notes (Signed)
Cc'ed to pcp °

## 2019-09-28 NOTE — Patient Instructions (Signed)
Your health issues we discussed today were:   GERD (reflux/heartburn): 1. I am glad your GERD symptoms are doing better 2. Continue taking your medications and call us for any worsening GERD symptoms  Diarrhea and urgency after drinking liquids: 1. As we discussed, this is quite an unusual problem.  I will discuss with other providers to see if they have had any patients with similar complaints 2. In the meantime, as we discussed, try to limit your fluid intake (water, soda, Kool-Aid, etc.) to meals 3. Take Imodium 3 times a day, 30 minutes before meal when you will drink fluids 4. Start taking a probiotic as well.  Some brands we have had good luck with include Restora, align, [employer], Publishing copy. 5. Keep in contact and let us know if your symptoms are persistent or if they worsen  Overall I recommend:  1. Continue your other current medications 2. Return for follow-up in 2 months 3. Call us if you have any questions or concerns.   At [facility] we value your feedback. You may receive a survey about your visit today. Please share your experience as we strive to create trusting relationships with our patients to provide genuine, compassionate, quality care.  We appreciate your understanding and patience as we review any laboratory studies, imaging, and other diagnostic tests that are ordered as we care for you. Our office policy is 5 business days for review of these results, and any emergent or urgent results are addressed in a timely manner for your best interest. If you do not hear from our office in 1 week, please contact us.   We also encourage the use of MyChart, which contains your medical information for your review as well. If you are not enrolled in this feature, an access code is on this after visit summary for your convenience. Thank you for allowing [place] to be involved in your care.  It was great to see you today!  I hope you have a great Summer!!

## 2019-09-28 NOTE — Assessment & Plan Note (Signed)
The patient describes recent onset of unusual diarrhea which started about 2 days ago.  She denies sick contacts or recent travel.  She only has loose stools after drinking liquids (such as a half bottle water to hold all of water, Kool-Aid, sodas, etc.).  She is try different bottled waters to make sure it was not a contaminant.  Does not drink well water.  Nobody else in the home seems to have symptoms like this.  No problems postprandial.  At this point I will discuss with other providers about possible etiologies as this is quite an unusual presentation.  In the meantime I have advised her to start taking a probiotic for the next 1 to 2 months.  Try to limit fluid intake to meals, take Imodium 30 minutes before meals (with fluids) 3 times a day.  Follow-up in 2 months.  I have asked her to keep in close contact to let us know how her symptoms are progressing.  Hopefully this is just a self-limited unusual viral gastroenteritis.  No red flag or warning signs or symptoms.

## 2019-09-28 NOTE — Assessment & Plan Note (Signed)
GERD symptoms are significantly improved.  Recommend she continue PPI and follow-up in 2 months.

## 2019-09-28 NOTE — Progress Notes (Signed)
Referring Provider: Sharion Balloon, FNP Primary Care Physician:  Sharion Balloon, FNP Primary GI:  Dr. Gala Romney  Chief Complaint  Patient presents with  . Gastroesophageal Reflux    doing ok    HPI:   Brittany Durham is a 49 y.o. female who presents for follow-up on GERD and epigastric abdominal pain.  The patient was last seen in our office 03/05/2019 for the same.  Colonoscopy on file 2018 and next due in 2028.  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  Historically Protonix 40 mg daily controls GERD.  At her last visit she noted still taking naproxen that was prescribed by the ED and was unaware it is an NSAID.  Has started having worsening GERD despite Protonix.  No other NSAIDs or aspirin powders.  No other overt GI complaints.  Recommended she avoid all NSAIDs and a extensive list was given to her.  Continue PPI.  Follow-up in 6 months.  Today she states she's doing ok overall. GERD is doing well on PPI. She does note bubbling in her stomach and fecal urgency only with liquids/drinking (water, soda, Koolade). Has had an accident. This has been occurring for the past 2 days. Doesn't occur with food. This happens about 20-30 minutes after drinking. Denies abdominal pain, N/V. Hematochezia, melena, fever, chills, unintentional weight loss.  She does drink bottled water. This only occurs one time after drinking and then not again until she drinks more liquid. Stools are liquid, has occurred about 3-5 times a day. Hasn't had any sick contacts, recent travel.  Past Medical History:  Diagnosis Date  . Asthma    childhood  . Carotidynia   . DDD (degenerative disc disease), cervical   . Hypertension   . Knee pain    left meniscus tear s/p repair 11/2014  . Seizures ([place])   . Stuttering    patient reports this started after her knee surgery in 11/2014.   Marland Kitchen Thyroid nodule     Past Surgical History:  Procedure Laterality Date  . ANTERIOR  CERVICAL DECOMP/DISCECTOMY FUSION N/A 03/20/2019   Procedure: Anterior Cervical Decompression Fusion - Cervical Four-Cervical Five - Cervical Five-Cervical Six;  Surgeon: Eustace Moore, MD;  Location: [place];  Service: Neurosurgery;  Laterality: N/A;  Anterior Cervical Decompression Fusion - Cervical Four-Cervical Five - Cervical Five-Cervical Six  . COLONOSCOPY N/A 08/23/2016   Procedure: COLONOSCOPY;  Surgeon: Daneil Dolin, MD;  [facility];  Service: Endoscopy;  Laterality: N/A;  1:00pm  . ESOPHAGOGASTRODUODENOSCOPY N/A 05/03/2016   Procedure: ESOPHAGOGASTRODUODENOSCOPY (EGD);  Surgeon: Daneil Dolin, MD;  Location[facility];  Service: Endoscopy;  Laterality: N/A;  . ESOPHAGOGASTRODUODENOSCOPY N/A 08/23/2016   Procedure: ESOPHAGOGASTRODUODENOSCOPY (EGD);  Surgeon: Daneil Dolin, MD;  [facility];  Service: Endoscopy;  Laterality: N/A;  . KNEE ARTHROSCOPY WITH MEDIAL MENISECTOMY Left 12/09/2014   Procedure: KNEE ARTHROSCOPY WITH PARTIAL MEDIAL AND LATERAL MENISECTOMY;  Surgeon: Sanjuana Kava, MD;  Location: [facility];  Service: Orthopedics;  Laterality: Left;  . TUBAL LIGATION    . WISDOM TOOTH EXTRACTION      Current Outpatient Medications  Medication Sig Dispense Refill  . amLODipine (NORVASC) 10 MG tablet Take 1 tablet (10 mg total) by mouth daily. 90 tablet 0  . baclofen (LIORESAL) 10 MG tablet Take 1 tablet (10 mg total) by mouth 3 (three) times daily. 90 each 3  . diclofenac (VOLTAREN) 75 MG EC tablet Take 75 mg  by mouth daily.    Marland Kitchen escitalopram (LEXAPRO) 10 MG tablet Take 1 tablet (10 mg total) by mouth daily. 90 tablet 3  . lisinopril (ZESTRIL) 20 MG tablet TAKE 1 TABLET DAILY 90 tablet 0  . pantoprazole (PROTONIX) 40 MG tablet TAKE 1 TABLET ONCE DAILY BEFORE BREAKFAST 30 tablet 5  . Vitamin D, Ergocalciferol, (DRISDOL) 1.25 MG (50000 UNIT) CAPS capsule Take 50,000 Units by mouth once a week.     No current facility-administered medications for this visit.      Allergies as of 09/28/2019  . (No Known Allergies)    Family History  Problem Relation Age of Onset  . Hypertension Mother   . Lung cancer Father   . Hypertension Brother   . Colon cancer Neg Hx   . Gastric cancer Neg Hx   . Esophageal cancer Neg Hx     Social History   Socioeconomic History  . Marital status: Single    Spouse name: Not on file  . Number of children: 1  . Years of education: 33  . Highest education level: Not on file  Occupational History  . Occupation: Umemployed    Comment: Disability  Tobacco Use  . Smoking status: Never Smoker  . Smokeless tobacco: Never Used  Substance and Sexual Activity  . Alcohol use: No  . Drug use: No  . Sexual activity: Not Currently    Birth control/protection: Surgical  Other Topics Concern  . Not on file  Social History Narrative   Lives at home alone.   Right-handed.   Occasional caffeine use.   Social Determinants of Health   Financial Resource Strain: Low Risk   . Difficulty of Paying Living Expenses: Not hard at all  Food Insecurity: No Food Insecurity  . Worried About Charity fundraiser in the Last Year: Never true  . Ran Out of Food in the Last Year: Never true  Transportation Needs: No Transportation Needs  . Lack of Transportation (Medical): No  . Lack of Transportation (Non-Medical): No  Physical Activity: Inactive  . Days of Exercise per Week: 0 days  . Minutes of Exercise per Session: 0 min  Stress: No Stress Concern Present  . Feeling of Stress : Not at all  Social Connections: Unknown  . Frequency of Communication with Friends and Family: More than three times a week  . Frequency of Social Gatherings with Friends and Family: More than three times a week  . Attends Religious Services: Not on file  . Active Member of Clubs or Organizations: No  . Attends Archivist Meetings: Never  . Marital Status: Not on file    Subjective: Review of Systems  Constitutional: Negative for  chills, fever, malaise/fatigue and weight loss.  HENT: Negative for congestion and sore throat.   Respiratory: Negative for cough and shortness of breath.   Cardiovascular: Negative for chest pain and palpitations.  Gastrointestinal: Negative for abdominal pain, blood in stool, diarrhea, melena, nausea and vomiting.  Musculoskeletal: Negative for joint pain and myalgias.  Skin: Negative for rash.  Neurological: Negative for dizziness and weakness.  Endo/Heme/Allergies: Does not bruise/bleed easily.  Psychiatric/Behavioral: Negative for depression. The patient is not nervous/anxious.   All other systems reviewed and are negative.    Objective: BP 126/77   Pulse 84   Temp (!) 96.9 F (36.1 C) (Temporal)   Ht 5\' 7"  (1.702 m)   Wt 220 lb 12.8 oz (100.2 kg)   BMI 34.58 kg/m  Physical Exam Vitals  and nursing note reviewed.  Constitutional:      General: She is not in acute distress.    Appearance: Normal appearance. She is well-developed. She is not ill-appearing, toxic-appearing or diaphoretic.  HENT:     Head: Normocephalic and atraumatic.     Nose: No congestion or rhinorrhea.  Eyes:     General: No scleral icterus. Cardiovascular:     Rate and Rhythm: Normal rate and regular rhythm.     Heart sounds: Normal heart sounds.  Pulmonary:     Effort: Pulmonary effort is normal. No respiratory distress.     Breath sounds: Normal breath sounds.  Abdominal:     General: Bowel sounds are normal.     Palpations: Abdomen is soft. There is no hepatomegaly, splenomegaly or mass.     Tenderness: There is no abdominal tenderness. There is no guarding or rebound.     Hernia: No hernia is present.  Skin:    General: Skin is warm and dry.     Coloration: Skin is not jaundiced.     Findings: No rash.  Neurological:     General: No focal deficit present.     Mental Status: She is alert and oriented to person, place, and time.  Psychiatric:        Attention and Perception: Attention  normal.        Mood and Affect: Mood normal.        Speech: Speech normal.        Behavior: Behavior normal.        Thought Content: Thought content normal.        Cognition and Memory: Cognition and memory normal.       09/28/2019 1:49 PM   Disclaimer: This note was dictated with voice recognition software. Similar sounding words can inadvertently be transcribed and may not be corrected upon review.

## 2019-10-02 ENCOUNTER — Other Ambulatory Visit: Payer: Self-pay | Admitting: Family

## 2019-10-02 DIAGNOSIS — I10 Essential (primary) hypertension: Secondary | ICD-10-CM

## 2019-12-02 ENCOUNTER — Encounter: Payer: Self-pay | Admitting: Nurse Practitioner

## 2019-12-02 ENCOUNTER — Ambulatory Visit (INDEPENDENT_AMBULATORY_CARE_PROVIDER_SITE_OTHER): Payer: Medicare Other | Admitting: Nurse Practitioner

## 2019-12-02 ENCOUNTER — Other Ambulatory Visit: Payer: Self-pay

## 2019-12-02 VITALS — BP 141/87 | HR 105 | Temp 97.7°F | Ht 67.0 in | Wt 224.2 lb

## 2019-12-02 DIAGNOSIS — Z0000 Encounter for general adult medical examination without abnormal findings: Secondary | ICD-10-CM | POA: Diagnosis not present

## 2019-12-02 DIAGNOSIS — K219 Gastro-esophageal reflux disease without esophagitis: Secondary | ICD-10-CM | POA: Diagnosis not present

## 2019-12-02 NOTE — Patient Instructions (Signed)
Your health issues we discussed today were:   GERD (reflux/heartburn): 1. I am glad Protonix is keeping her symptoms under control! 2. Continue to take Protonix 40 mg once a day 3. Call us if you have any worsening or severe symptoms  Need for one-time screening for hepatitis C: 1. As we discussed, hepatitis C virus is transmitted from blood to blood 2. The Centers for Disease Control (CDC) recommends one-time screening for all adults, regardless of any high risk behaviors 3. I do not identify any high risk behaviors with you, so one-time screening should suffice and you should not need ongoing screening 4. Have your lab test drawn when you are able to and we will call you with the results 5. Let us know if you have any questions about this  Overall I recommend:  1. Continue your other current medications 2. Return for follow-up in 6 months 3. Call us for any questions or concerns   ---------------------------------------------------------------  I am glad you have gotten your COVID-19 vaccination!  Even though you are fully vaccinated you should continue to follow CDC and state/local guidelines.  ---------------------------------------------------------------   At [facility] we value your feedback. You may receive a survey about your visit today. Please share your experience as we strive to create trusting relationships with our patients to provide genuine, compassionate, quality care.  We appreciate your understanding and patience as we review any laboratory studies, imaging, and other diagnostic tests that are ordered as we care for you. Our office policy is 5 business days for review of these results, and any emergent or urgent results are addressed in a timely manner for your best interest. If you do not hear from our office in 1 week, please contact us.   We also encourage the use of MyChart, which contains your medical information for your review as well. If you  are not enrolled in this feature, an access code is on this after visit summary for your convenience. Thank you for allowing [place] to be involved in your care.  It was great to see you today!  I hope you have a great Summer!!

## 2019-12-02 NOTE — Assessment & Plan Note (Signed)
Care.noted lacking hepatitis C screening.  Per CDC recommendations I have discussed with the patient and she agrees to one-time CDC recommended hepatitis C screening for all adults.  No identified high risk behaviors that would justify ongoing screening.  I put in her lab orders that she can have completed at her convenience with her primary care lab site.  We will call with the results and marked care gap complete when results return.

## 2019-12-02 NOTE — Progress Notes (Signed)
Referring Provider: Sharion Balloon, FNP Primary Care Physician:  Sharion Balloon, FNP Primary GI:  Dr. Gala Romney  Chief Complaint  Patient presents with  . Gastroesophageal Reflux    doing well    HPI:   Brittany Durham is a 49 y.o. female who presents for follow-up on GERD and diarrhea.  The patient was last seen in our office 09/28/2019 for the same as well as epigastric abdominal pain.  Colonoscopy on file 2018 and next due in 2028.  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  Despite previous recommendations has intermittently noted still be on NSAIDs.  At her last visit GERD still doing well on PPI, does note bubbling in her stomach and fecal urgency only with liquid/drinking such as water, soda, Kool-Aid.  She has had fecal urgency and incontinence.  This is occurred over the past 2 days, not with food, typically 20 to 30 minutes after drinking.  Drinks bottled water (no well water) and her loose stools after drinking only occur typically once and not again until she drinks more fluids.  Stools are liquid, 3-4 times a day, no sick contacts or recent travel.  No other overt GI complaints.  Recommended continue PPI, limit fluid intake to meals, Imodium 3 times a day 30 minutes before meal when drinking fluids, start probiotic, keep in contact and let us know of any persistence or worsening of symptoms, follow-up in 2 months otherwise.  There is no further contact from the patient on her progress.  Today she states she's doing ok overall. GERD symptoms are doing well on Protonix. No breakthrough to speak of. Denies dysphagia. Denies abdominal pain, N/V, hematochezia, melena, fever, chills, unintentional weight loss. Denies URI or flu-like symptoms. Denies loss of sense of taste or smell. The patient has received COVID-19 vaccination(s). Denies chest pain, dyspnea, dizziness, lightheadedness,  syncope, near syncope. Denies any other upper or lower GI symptoms.  She has never been screened for HCV. After discussion, she agrees to one time screening as recommended by the CDC.  Past Medical History:  Diagnosis Date  . Asthma    childhood  . Carotidynia   . DDD (degenerative disc disease), cervical   . Hypertension   . Knee pain    left meniscus tear s/p repair 11/2014  . Seizures ([place])   . Stuttering    patient reports this started after her knee surgery in 11/2014.   Marland Kitchen Thyroid nodule     Past Surgical History:  Procedure Laterality Date  . ANTERIOR CERVICAL DECOMP/DISCECTOMY FUSION N/A 03/20/2019   Procedure: Anterior Cervical Decompression Fusion - Cervical Four-Cervical Five - Cervical Five-Cervical Six;  Surgeon: Eustace Moore, MD;  Location: [facility];  Service: Neurosurgery;  Laterality: N/A;  Anterior Cervical Decompression Fusion - Cervical Four-Cervical Five - Cervical Five-Cervical Six  . COLONOSCOPY N/A 08/23/2016   Procedure: COLONOSCOPY;  Surgeon: Daneil Dolin, MD;  [facility];  Service: Endoscopy;  Laterality: N/A;  1:00pm  . ESOPHAGOGASTRODUODENOSCOPY N/A 05/03/2016   Procedure: ESOPHAGOGASTRODUODENOSCOPY (EGD);  Surgeon: Daneil Dolin, MD;  [facility];  Service: Endoscopy;  Laterality: N/A;  . ESOPHAGOGASTRODUODENOSCOPY N/A 08/23/2016   Procedure: ESOPHAGOGASTRODUODENOSCOPY (EGD);  Surgeon: Daneil Dolin, MD;  [facility];  Service: Endoscopy;  Laterality: N/A;  . KNEE ARTHROSCOPY WITH MEDIAL MENISECTOMY Left 12/09/2014   Procedure: KNEE ARTHROSCOPY WITH PARTIAL MEDIAL AND LATERAL MENISECTOMY;  Surgeon:  Sanjuana Kava, MD;  [facility];  Service: Orthopedics;  Laterality: Left;  . TUBAL LIGATION    . WISDOM TOOTH EXTRACTION      Current Outpatient Medications  Medication Sig Dispense Refill  . amLODipine (NORVASC) 10 MG tablet TAKE 1 TABLET ONCE DAILY 90 tablet 1  . baclofen (LIORESAL) 10 MG tablet Take 1 tablet (10 mg  total) by mouth 3 (three) times daily. 90 each 3  . diclofenac (VOLTAREN) 75 MG EC tablet Take 75 mg by mouth daily.    Marland Kitchen escitalopram (LEXAPRO) 10 MG tablet Take 1 tablet (10 mg total) by mouth daily. 90 tablet 3  . lisinopril (ZESTRIL) 20 MG tablet TAKE 1 TABLET DAILY 90 tablet 1  . pantoprazole (PROTONIX) 40 MG tablet TAKE 1 TABLET ONCE DAILY BEFORE BREAKFAST 30 tablet 5  . Vitamin D, Ergocalciferol, (DRISDOL) 1.25 MG (50000 UNIT) CAPS capsule Take 50,000 Units by mouth once a week.     No current facility-administered medications for this visit.    Allergies as of 12/02/2019  . (No Known Allergies)    Family History  Problem Relation Age of Onset  . Hypertension Mother   . Lung cancer Father   . Hypertension Brother   . Colon cancer Neg Hx   . Gastric cancer Neg Hx   . Esophageal cancer Neg Hx     Social History   Socioeconomic History  . Marital status: Single    Spouse name: Not on file  . Number of children: 1  . Years of education: 54  . Highest education level: Not on file  Occupational History  . Occupation: Umemployed    Comment: Disability  Tobacco Use  . Smoking status: Never Smoker  . Smokeless tobacco: Never Used  Vaping Use  . Vaping Use: Never used  Substance and Sexual Activity  . Alcohol use: No  . Drug use: No  . Sexual activity: Not Currently    Birth control/protection: Surgical  Other Topics Concern  . Not on file  Social History Narrative   Lives at home alone.   Right-handed.   Occasional caffeine use.   Social Determinants of Health   Financial Resource Strain:   . Difficulty of Paying Living Expenses:   Food Insecurity:   . Worried About Charity fundraiser in the Last Year:   . Arboriculturist in the Last Year:   Transportation Needs:   . Film/video editor (Medical):   Marland Kitchen Lack of Transportation (Non-Medical):   Physical Activity:   . Days of Exercise per Week:   . Minutes of Exercise per Session:   Stress:   . Feeling  of Stress :   Social Connections:   . Frequency of Communication with Friends and Family:   . Frequency of Social Gatherings with Friends and Family:   . Attends Religious Services:   . Active Member of Clubs or Organizations:   . Attends Archivist Meetings:   Marland Kitchen Marital Status:     Subjective: Review of Systems  Constitutional: Negative for chills, fever, malaise/fatigue and weight loss.  HENT: Negative for congestion and sore throat.   Respiratory: Negative for cough and shortness of breath.   Cardiovascular: Negative for chest pain and palpitations.  Gastrointestinal: Negative for abdominal pain, blood in stool, diarrhea, melena, nausea and vomiting.  Musculoskeletal: Negative for joint pain and myalgias.  Skin: Negative for rash.  Neurological: Negative for dizziness and weakness.  Endo/Heme/Allergies: Does not bruise/bleed easily.  Psychiatric/Behavioral:  Negative for depression. The patient is not nervous/anxious.   All other systems reviewed and are negative.    Objective: BP (!) 141/87   Pulse (!) 105   Temp 97.7 F (36.5 C)   Ht 5\' 7"  (1.702 m)   Wt 224 lb 3.2 oz (101.7 kg)   BMI 35.11 kg/m  Physical Exam Vitals and nursing note reviewed.  Constitutional:      General: She is not in acute distress.    Appearance: Normal appearance. She is well-developed. She is obese. She is not ill-appearing, toxic-appearing or diaphoretic.  HENT:     Head: Normocephalic and atraumatic.     Nose: No congestion or rhinorrhea.  Eyes:     General: No scleral icterus. Cardiovascular:     Rate and Rhythm: Normal rate and regular rhythm.     Heart sounds: Normal heart sounds.  Pulmonary:     Effort: Pulmonary effort is normal. No respiratory distress.     Breath sounds: Normal breath sounds.  Abdominal:     General: Bowel sounds are normal.     Palpations: Abdomen is soft. There is no hepatomegaly, splenomegaly or mass.     Tenderness: There is no abdominal  tenderness. There is no guarding or rebound.     Hernia: No hernia is present.  Skin:    General: Skin is warm and dry.     Coloration: Skin is not jaundiced.     Findings: No rash.  Neurological:     General: No focal deficit present.     Mental Status: She is alert and oriented to person, place, and time.  Psychiatric:        Attention and Perception: Attention normal.        Mood and Affect: Mood normal.        Speech: Speech normal.        Behavior: Behavior normal.        Thought Content: Thought content normal.        Cognition and Memory: Cognition and memory normal.       12/02/2019 3:06 PM   Disclaimer: This note was dictated with voice recognition software. Similar sounding words can inadvertently be transcribed and may not be corrected upon review.

## 2019-12-02 NOTE — Assessment & Plan Note (Signed)
GERD symptoms doing well overall.  No breakthrough symptoms on Protonix daily.  Recommend she continue her current medications and follow-up in 6 months.  Call for any worsening or severe symptoms in the interim.

## 2019-12-03 NOTE — Progress Notes (Signed)
CC'ED TO PCP 

## 2019-12-09 ENCOUNTER — Ambulatory Visit (INDEPENDENT_AMBULATORY_CARE_PROVIDER_SITE_OTHER): Payer: Medicare Other

## 2019-12-09 DIAGNOSIS — Z0000 Encounter for general adult medical examination without abnormal findings: Secondary | ICD-10-CM | POA: Diagnosis not present

## 2019-12-09 NOTE — Patient Instructions (Signed)
  [facility] Maintenance Summary and Written Plan of Care  Brittany Durham ,  Thank you for allowing me to perform your Medicare Annual Wellness Visit and for your ongoing commitment to your health.   Health Maintenance & Immunization History Health Maintenance  Topic Date Due  . Hepatitis C Screening  Never done  . MAMMOGRAM  Never done  . TETANUS/TDAP  Never done  . PAP SMEAR-Modifier  04/30/2020  . COLONOSCOPY  08/24/2026  . COVID-19 Vaccine  Completed  . HIV Screening  Completed  . INFLUENZA VACCINE  Discontinued   Immunization History  Administered Date(s) Administered  . Moderna SARS-COVID-2 Vaccination 07/25/2019, 08/26/2019    These are the patient goals that we discussed: Goals Addressed   None      This is a list of Health Maintenance Items that are overdue or due now: Health Maintenance Due  Topic Date Due  . Hepatitis C Screening  Never done  . MAMMOGRAM  Never done  . TETANUS/TDAP  Never done     Orders/Referrals Placed Today: No orders of the defined types were placed in this encounter.  (Contact our referral department at [phone] if you have not spoken with someone about your referral appointment within the next 5 days)    Follow-up Plan  Scheduled with Brittany Durham 02/29/2020 at 12:10pm

## 2019-12-09 NOTE — Progress Notes (Addendum)
MEDICARE ANNUAL WELLNESS VISIT  12/09/2019  Telephone Visit Disclaimer This Medicare AWV was conducted by telephone due to national recommendations for restrictions regarding the COVID-19 Pandemic (e.g. social distancing).  I verified, using two identifiers, that I am speaking with Brittany Durham or their authorized healthcare agent. I discussed the limitations, risks, security, and privacy concerns of performing an evaluation and management service by telephone and the potential availability of an in-person appointment in the future. The patient expressed understanding and agreed to proceed.   Subjective:  Brittany Durham is a 49 y.o. female patient of Hawks, Theador Hawthorne, FNP who had a Medicare Annual Wellness Visit today via telephone. Vester is Disabled and lives alone. she has 1 daughter. she reports that she is socially active and does interact with friends/family regularly. she is minimally physically active and enjoys reading and being with her grandson.  Patient Care Team: Sharion Balloon, FNP as PCP - General (Nurse Practitioner) Buford Dresser, MD as PCP - Cardiology (Cardiology) Daneil Dolin, MD as Consulting Physician (Gastroenterology)  Advanced Directives 12/09/2019 03/20/2019 03/17/2019 02/01/2019 01/05/2019 10/29/2018 07/06/2018  Does Patient Have a Medical Advance Directive? Yes;No No No No No No No  Does patient want to make changes to medical advance directive? No - Patient declined - - - - - -  Would patient like information on creating a medical advance directive? No - Patient declined No - Patient declined No - Patient declined - No - Patient declined No - Patient declined No - Patient declined    Hospital Utilization Over the Past 12 Months: # of hospitalizations or ER visits: 1 # of surgeries: 1  Review of Systems    Patient reports that her overall health is unchanged compared to last year.  Patient Reported Readings (BP, Pulse, CBG, Weight,  etc) none  Pain Assessment Pain : No/denies pain     Current Medications & Allergies (verified) Allergies as of 12/09/2019   No Known Allergies      Medication List        Accurate as of December 09, 2019  1:46 PM. If you have any questions, ask your nurse or doctor.          amLODipine 10 MG tablet Commonly known as: NORVASC TAKE 1 TABLET ONCE DAILY   baclofen 10 MG tablet Commonly known as: LIORESAL Take 1 tablet (10 mg total) by mouth 3 (three) times daily.   diclofenac 75 MG EC tablet Commonly known as: VOLTAREN Take 75 mg by mouth daily.   escitalopram 10 MG tablet Commonly known as: Lexapro Take 1 tablet (10 mg total) by mouth daily.   lisinopril 20 MG tablet Commonly known as: ZESTRIL TAKE 1 TABLET DAILY   pantoprazole 40 MG tablet Commonly known as: PROTONIX TAKE 1 TABLET ONCE DAILY BEFORE BREAKFAST   Vitamin D (Ergocalciferol) 1.25 MG (50000 UNIT) Caps capsule Commonly known as: DRISDOL Take 50,000 Units by mouth once a week.        History (reviewed): Past Medical History:  Diagnosis Date   Asthma    childhood   Carotidynia    DDD (degenerative disc disease), cervical    Hypertension    Knee pain    left meniscus tear s/p repair 11/2014   Seizures ([place])    Stuttering    patient reports this started after her knee surgery in 11/2014.    Thyroid nodule    Past Surgical History:  Procedure Laterality Date   ANTERIOR CERVICAL DECOMP/DISCECTOMY FUSION N/A  03/20/2019   Procedure: Anterior Cervical Decompression Fusion - Cervical Four-Cervical Five - Cervical Five-Cervical Six;  Surgeon: Eustace Moore, MD;  Location: [facility];  Service: Neurosurgery;  Laterality: N/A;  Anterior Cervical Decompression Fusion - Cervical Four-Cervical Five - Cervical Five-Cervical Six   COLONOSCOPY N/A 08/23/2016   Procedure: COLONOSCOPY;  Surgeon: Daneil Dolin, MD;  [facility];  Service: Endoscopy;  Laterality: N/A;  1:00pm   ESOPHAGOGASTRODUODENOSCOPY  N/A 05/03/2016   Procedure: ESOPHAGOGASTRODUODENOSCOPY (EGD);  Surgeon: Daneil Dolin, MD;  [facility];  Service: Endoscopy;  Laterality: N/A;   ESOPHAGOGASTRODUODENOSCOPY N/A 08/23/2016   Procedure: ESOPHAGOGASTRODUODENOSCOPY (EGD);  Surgeon: Daneil Dolin, MD;  [facility];  Service: Endoscopy;  Laterality: N/A;   KNEE ARTHROSCOPY WITH MEDIAL MENISECTOMY Left 12/09/2014   Procedure: KNEE ARTHROSCOPY WITH PARTIAL MEDIAL AND LATERAL MENISECTOMY;  Surgeon: Sanjuana Kava, MD;  [facility];  Service: Orthopedics;  Laterality: Left;   TUBAL LIGATION     WISDOM TOOTH EXTRACTION     Family History  Problem Relation Age of Onset   Hypertension Mother    Lung cancer Father    Hypertension Brother    Colon cancer Neg Hx    Gastric cancer Neg Hx    Esophageal cancer Neg Hx    Social History   Socioeconomic History   Marital status: Single    Spouse name: Not on file   Number of children: 1   Years of education: 12   Highest education level: Not on file  Occupational History   Occupation: Umemployed    Comment: Disability  Tobacco Use   Smoking status: Never Smoker   Smokeless tobacco: Never Used  Scientific laboratory technician Use: Never used  Substance and Sexual Activity   Alcohol use: No   Drug use: No   Sexual activity: Not Currently    Birth control/protection: Surgical  Other Topics Concern   Not on file  Social History Narrative   Lives at home alone.   Right-handed.   Occasional caffeine use.   Social Determinants of Health   Financial Resource Strain:    Difficulty of Paying Living Expenses:   Food Insecurity:    Worried About Charity fundraiser in the Last Year:    Arboriculturist in the Last Year:   Transportation Needs:    Film/video editor (Medical):    Lack of Transportation (Non-Medical):   Physical Activity:    Days of Exercise per Week:    Minutes of Exercise per Session:   Stress:    Feeling of Stress :   Social  Connections:    Frequency of Communication with Friends and Family:    Frequency of Social Gatherings with Friends and Family:    Attends Religious Services:    Active Member of Clubs or Organizations:    Attends Archivist Meetings:    Marital Status:     Activities of Daily Living In your present state of health, do you have any difficulty performing the following activities: 12/09/2019 03/20/2019  Hearing? N N  Vision? N N  Difficulty concentrating or making decisions? N N  Walking or climbing stairs? N Y  Dressing or bathing? N Y  Doing errands, shopping? Y N  Preparing Food and eating ? N -  Using the Toilet? N -  In the past six months, have you accidently leaked urine? N -  Do you have problems with loss of bowel control?  N -  Managing your Medications? N -  Managing your Finances? N -  Housekeeping or managing your Housekeeping? N -  Some recent data might be hidden    Patient Education/ Literacy How often do you need to have someone help you when you read instructions, pamphlets, or other written materials from your doctor or pharmacy?: 2 - Rarely  Exercise Current Exercise Habits: Home exercise routine, Type of exercise: walking, Time (Minutes): 30, Frequency (Times/Week): 3, Weekly Exercise (Minutes/Week): 90, Intensity: Mild  Diet Patient reports consuming 2 meals a day and 2 snack(s) a day Patient reports that her primary diet is: Low Sodium Patient reports that she does have regular access to food.   Depression Screen PHQ 2/9 Scores 12/09/2019 06/15/2019 04/29/2019 06/19/2018 02/25/2018 11/18/2017 07/29/2017  PHQ - 2 Score 0 0 0 0 0 0 1  PHQ- 9 Score - - - - - - -     Fall Risk Fall Risk  12/09/2019 06/15/2019 04/29/2019 11/18/2017 07/29/2017  Falls in the past year? 0 0 0 Yes Yes  Number falls in past yr: - - - 1 1  Comment - - - - -  Injury with Fall? - - - No -  Comment - - - - -     Objective:  Brittany Durham seemed alert and oriented and she  participated appropriately during our telephone visit.  Blood Pressure Weight BMI  BP Readings from Last 3 Encounters:  12/02/19 (!) 141/87  09/28/19 126/77  06/15/19 140/85   Wt Readings from Last 3 Encounters:  12/02/19 224 lb 3.2 oz (101.7 kg)  09/28/19 220 lb 12.8 oz (100.2 kg)  06/15/19 218 lb 9.6 oz (99.2 kg)   BMI Readings from Last 1 Encounters:  12/02/19 35.11 kg/m    *Unable to obtain current vital signs, weight, and BMI due to telephone visit type  Hearing/Vision  Izetta did not seem to have difficulty with hearing/understanding during the telephone conversation Reports that she has had a formal eye exam by an eye care professional within the past year Reports that she has not had a formal hearing evaluation within the past year *Unable to fully assess hearing and vision during telephone visit type  Cognitive Function: 6CIT Screen 12/09/2019 12/09/2019 10/29/2018  What Year? - 0 points 0 points  What month? - 0 points 0 points  What time? - 0 points 0 points  Count back from 20 - 0 points 0 points  Months in reverse 2 points 0 points 0 points  Repeat phrase - 0 points 0 points  Total Score - 0 0   (Normal:0-7, Significant for Dysfunction: >8)  Normal Cognitive Function Screening: Yes   Immunization & Health Maintenance Record Immunization History  Administered Date(s) Administered   Moderna SARS-COVID-2 Vaccination 07/25/2019, 08/26/2019    Health Maintenance  Topic Date Due   Hepatitis C Screening  Never done   MAMMOGRAM  Never done   TETANUS/TDAP  Never done   PAP SMEAR-Modifier  04/30/2020   COLONOSCOPY  08/24/2026   COVID-19 Vaccine  Completed   HIV Screening  Completed   INFLUENZA VACCINE  Discontinued       Assessment  This is a routine wellness examination for [facility].  Health Maintenance: Due or Overdue Health Maintenance Due  Topic Date Due   Hepatitis C Screening  Never done   MAMMOGRAM  Never done   TETANUS/TDAP  Never done     Brittany Durham does not need a referral for Community Assistance: Care  Management:   no Social Work:    no Prescription Assistance:  no Nutrition/Diabetes Education:  no   Plan:  Personalized Goals Goals Addressed   None    Personalized Health Maintenance & Screening Recommendations    Lung Cancer Screening Recommended: no (Low Dose CT Chest recommended if Age 4-80 years, 30 pack-year currently smoking OR have quit w/in past 15 years) Hepatitis C Screening recommended: no HIV Screening recommended: no  Advanced Directives: Written information was not prepared per patient's request.  Referrals & Orders No orders of the defined types were placed in this encounter.   Follow-up Plan Follow-up with Sharion Balloon, FNP as planned Schedule 02/29/2020    I have personally reviewed and noted the following in the patient's chart:   Medical and social history Use of alcohol, tobacco or illicit drugs  Current medications and supplements Functional ability and status Nutritional status Physical activity Advanced directives List of other physicians Hospitalizations, surgeries, and ER visits in previous 12 months Vitals Screenings to include cognitive, depression, and falls Referrals and appointments  In addition, I have reviewed and discussed with Brittany Durham certain preventive protocols, quality metrics, and best practice recommendations. A written personalized care plan for preventive services as well as general preventive health recommendations is available and can be mailed to the patient at her request.      Maud Deed [facility]  01/09/8336    I have reviewed and agree with the above AWV documentation.   Evelina Dun, FNP

## 2019-12-10 DIAGNOSIS — M5412 Radiculopathy, cervical region: Secondary | ICD-10-CM | POA: Diagnosis not present

## 2020-01-13 ENCOUNTER — Encounter: Payer: Self-pay | Admitting: *Deleted

## 2020-01-14 ENCOUNTER — Other Ambulatory Visit: Payer: Self-pay

## 2020-01-14 ENCOUNTER — Other Ambulatory Visit: Payer: Medicare Other

## 2020-01-14 DIAGNOSIS — Z0000 Encounter for general adult medical examination without abnormal findings: Secondary | ICD-10-CM | POA: Diagnosis not present

## 2020-01-15 ENCOUNTER — Encounter (HOSPITAL_COMMUNITY): Payer: Self-pay | Admitting: Emergency Medicine

## 2020-01-15 ENCOUNTER — Emergency Department (HOSPITAL_COMMUNITY)
Admission: EM | Admit: 2020-01-15 | Discharge: 2020-01-15 | Disposition: A | Discharge status: Home / Self Care | Payer: Medicare Other | Attending: Emergency Medicine | Admitting: Emergency Medicine

## 2020-01-15 ENCOUNTER — Emergency Department (HOSPITAL_COMMUNITY): Payer: Medicare Other

## 2020-01-15 ENCOUNTER — Other Ambulatory Visit: Payer: Self-pay

## 2020-01-15 DIAGNOSIS — J45909 Unspecified asthma, uncomplicated: Secondary | ICD-10-CM | POA: Diagnosis not present

## 2020-01-15 DIAGNOSIS — R4781 Slurred speech: Secondary | ICD-10-CM | POA: Diagnosis not present

## 2020-01-15 DIAGNOSIS — D509 Iron deficiency anemia, unspecified: Secondary | ICD-10-CM | POA: Diagnosis not present

## 2020-01-15 DIAGNOSIS — I499 Cardiac arrhythmia, unspecified: Secondary | ICD-10-CM | POA: Diagnosis not present

## 2020-01-15 DIAGNOSIS — J0120 Acute ethmoidal sinusitis, unspecified: Secondary | ICD-10-CM | POA: Diagnosis not present

## 2020-01-15 DIAGNOSIS — R42 Dizziness and giddiness: Secondary | ICD-10-CM | POA: Diagnosis not present

## 2020-01-15 DIAGNOSIS — Z79899 Other long term (current) drug therapy: Secondary | ICD-10-CM | POA: Insufficient documentation

## 2020-01-15 DIAGNOSIS — I10 Essential (primary) hypertension: Secondary | ICD-10-CM | POA: Diagnosis not present

## 2020-01-15 DIAGNOSIS — D508 Other iron deficiency anemias: Secondary | ICD-10-CM

## 2020-01-15 DIAGNOSIS — J322 Chronic ethmoidal sinusitis: Secondary | ICD-10-CM | POA: Diagnosis not present

## 2020-01-15 DIAGNOSIS — J3489 Other specified disorders of nose and nasal sinuses: Secondary | ICD-10-CM | POA: Diagnosis not present

## 2020-01-15 DIAGNOSIS — Z743 Need for continuous supervision: Secondary | ICD-10-CM | POA: Diagnosis not present

## 2020-01-15 DIAGNOSIS — R404 Transient alteration of awareness: Secondary | ICD-10-CM | POA: Diagnosis not present

## 2020-01-15 LAB — CBC WITH DIFFERENTIAL/PLATELET
Abs Immature Granulocytes: 0.03 10*3/uL (ref 0.00–0.07)
Basophils Absolute: 0 10*3/uL (ref 0.0–0.1)
Basophils Relative: 0 %
Eosinophils Absolute: 0.1 10*3/uL (ref 0.0–0.5)
Eosinophils Relative: 1 %
HCT: 31.1 % — ABNORMAL LOW (ref 36.0–46.0)
Hemoglobin: 9.2 g/dL — ABNORMAL LOW (ref 12.0–15.0)
Immature Granulocytes: 0 %
Lymphocytes Relative: 23 %
Lymphs Abs: 1.6 10*3/uL (ref 0.7–4.0)
MCH: 24.9 pg — ABNORMAL LOW (ref 26.0–34.0)
MCHC: 29.6 g/dL — ABNORMAL LOW (ref 30.0–36.0)
MCV: 84.1 fL (ref 80.0–100.0)
Monocytes Absolute: 0.5 10*3/uL (ref 0.1–1.0)
Monocytes Relative: 7 %
Neutro Abs: 4.8 10*3/uL (ref 1.7–7.7)
Neutrophils Relative %: 69 %
Platelets: 231 10*3/uL (ref 150–400)
RBC: 3.7 MIL/uL — ABNORMAL LOW (ref 3.87–5.11)
RDW: 14.1 % (ref 11.5–15.5)
WBC: 7 10*3/uL (ref 4.0–10.5)
nRBC: 0 % (ref 0.0–0.2)

## 2020-01-15 LAB — POC OCCULT BLOOD, ED: Fecal Occult Bld: NEGATIVE

## 2020-01-15 LAB — COMPREHENSIVE METABOLIC PANEL
ALT: 23 U/L (ref 0–44)
AST: 15 U/L (ref 15–41)
Albumin: 3.7 g/dL (ref 3.5–5.0)
Alkaline Phosphatase: 98 U/L (ref 38–126)
Anion gap: 10 (ref 5–15)
BUN: 14 mg/dL (ref 6–20)
CO2: 26 mmol/L (ref 22–32)
Calcium: 9.3 mg/dL (ref 8.9–10.3)
Chloride: 102 mmol/L (ref 98–111)
Creatinine, Ser: 0.64 mg/dL (ref 0.44–1.00)
GFR calc Af Amer: >60 mL/min (ref >60)
GFR calc non Af Amer: >60 mL/min (ref >60)
Glucose, Bld: 95 mg/dL (ref 70–99)
Potassium: 3.7 mmol/L (ref 3.5–5.1)
Sodium: 138 mmol/L (ref 135–145)
Total Bilirubin: 0.6 mg/dL (ref 0.3–1.2)
Total Protein: 7.6 g/dL (ref 6.5–8.1)

## 2020-01-15 LAB — HEPATITIS C ANTIBODY: Hep C Virus Ab: 0.2 s/co ratio (ref 0.0–0.9)

## 2020-01-15 MED ORDER — ONDANSETRON HCL 4 MG/2ML IJ SOLN
4.0000 mg | Freq: Once | INTRAMUSCULAR | Status: AC
  Administered 2020-01-15: 4 mg via INTRAVENOUS
  Filled 2020-01-15: qty 2

## 2020-01-15 MED ORDER — MECLIZINE HCL 25 MG PO TABS: 25.0000 mg | ORAL_TABLET | Freq: Three times a day (TID) | ORAL | 0 refills | Status: DC | PRN

## 2020-01-15 MED ORDER — MECLIZINE HCL 12.5 MG PO TABS
25.0000 mg | ORAL_TABLET | Freq: Once | ORAL | Status: AC
  Administered 2020-01-15: 25 mg via ORAL
  Filled 2020-01-15: qty 2

## 2020-01-15 MED ORDER — SODIUM CHLORIDE 0.9 % IV BOLUS
1000.0000 mL | Freq: Once | INTRAVENOUS | Status: AC
  Administered 2020-01-15: 1000 mL via INTRAVENOUS

## 2020-01-15 NOTE — ED Provider Notes (Signed)
[facility] EMERGENCY DEPARTMENT Provider Note   CSN: [id number] Arrival date & time: 01/15/20  0844     History Chief Complaint  Patient presents with  . Dizziness    Brittany Durham is a 49 y.o. female.  Patient with minor dizziness.  No weakness  The history is provided by the patient. No language interpreter was used.  Dizziness Quality:  Lightheadedness Severity:  Mild Onset quality:  Gradual Timing:  Intermittent Progression:  Waxing and waning Chronicity:  New Context: not when bending over   Relieved by:  Nothing Worsened by:  Nothing Ineffective treatments:  None tried Associated symptoms: no blood in stool, no chest pain, no diarrhea and no headaches        Past Medical History:  Diagnosis Date  . Asthma    childhood  . Carotidynia   . DDD (degenerative disc disease), cervical   . Hypertension   . Knee pain    left meniscus tear s/p repair 11/2014  . Seizures ([place])   . Stuttering    patient reports this started after her knee surgery in 11/2014.   Marland Kitchen Thyroid nodule     Patient Active Problem List   Diagnosis Date Noted  . Preventative health care 12/02/2019  . Diarrhea 09/28/2019  . GAD (generalized anxiety disorder) 07/28/2019  . S/P cervical spinal fusion 03/20/2019  . Chest pain 12/31/2018  . Osteoarthritis 11/18/2017  . Insomnia 11/18/2017  . 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 Tear of lateral meniscus of right knee, current 10/28/2017  . 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 Language difficulty 09/25/2016  . Nausea with vomiting 07/18/2016  . PUD (peptic ulcer disease) 07/18/2016  . Slurred speech   . GI bleed 05/02/2016  . Upper GI bleed 05/02/2016  . Sinus tachycardia 05/02/2016  . Acute blood loss anemia 05/02/2016  . Abdominal pain, epigastric 05/02/2016  . 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 Thyroid nodule 12/14/2014  . Left knee pain 12/14/2014  . Seizures ([place])   . Altered mental status 12/12/2014  .  Vitamin D deficiency 06/30/2014  . GERD (gastroesophageal reflux disease) 06/29/2014  . SYNCOPE 03/02/2010  . HYPERTENSION, BENIGN 03/01/2010    Past Surgical History:  Procedure Laterality Date  . ANTERIOR CERVICAL DECOMP/DISCECTOMY FUSION N/A 03/20/2019   Procedure: Anterior Cervical Decompression Fusion - Cervical Four-Cervical Five - Cervical Five-Cervical Six;  Surgeon: Eustace Moore, MD;  Location: [place];  Service: Neurosurgery;  Laterality: N/A;  Anterior Cervical Decompression Fusion - Cervical Four-Cervical Five - Cervical Five-Cervical Six  . COLONOSCOPY N/A 08/23/2016   Procedure: COLONOSCOPY;  Surgeon: Daneil Dolin, MD;  [facility];  Service: Endoscopy;  Laterality: N/A;  1:00pm  . ESOPHAGOGASTRODUODENOSCOPY N/A 05/03/2016   Procedure: ESOPHAGOGASTRODUODENOSCOPY (EGD);  Surgeon: Daneil Dolin, MD;  [facility];  Service: Endoscopy;  Laterality: N/A;  . ESOPHAGOGASTRODUODENOSCOPY N/A 08/23/2016   Procedure: ESOPHAGOGASTRODUODENOSCOPY (EGD);  Surgeon: Daneil Dolin, MD;  [facility];  Service: Endoscopy;  Laterality: N/A;  . KNEE ARTHROSCOPY WITH MEDIAL MENISECTOMY Left 12/09/2014   Procedure: KNEE ARTHROSCOPY WITH PARTIAL MEDIAL AND LATERAL MENISECTOMY;  Surgeon: Sanjuana Kava, MD;  Location: [facility];  Service: Orthopedics;  Laterality: Left;  . TUBAL LIGATION    . WISDOM TOOTH EXTRACTION       OB History    Gravida  1   Para  1   Term  1   Preterm      AB      Living  1     SAB      TAB      Ectopic      Multiple      Live Births              Family History  Problem Relation Age of Onset  . Hypertension Mother   . Lung cancer Father   . Hypertension Brother   . Colon cancer Neg Hx   . Gastric cancer Neg Hx   . Esophageal cancer Neg Hx     Social History   Tobacco Use  . Smoking status: Never Smoker  . Smokeless tobacco: Never Used  Vaping Use  . Vaping Use: Never used  Substance Use Topics  . Alcohol  use: No  . Drug use: No    Home Medications Prior to Admission medications   Medication Sig Start Date End Date Taking? Authorizing Provider  amLODipine (NORVASC) 10 MG tablet TAKE 1 TABLET ONCE DAILY 10/02/19   Evelina Dun A, FNP  baclofen (LIORESAL) 10 MG tablet Take 1 tablet (10 mg total) by mouth 3 (three) times daily. 06/15/19   Sharion Balloon, FNP  diclofenac (VOLTAREN) 75 MG EC tablet Take 75 mg by mouth daily. 05/21/19   [provider]  escitalopram (LEXAPRO) 10 MG tablet Take 1 tablet (10 mg total) by mouth daily. 06/15/19   Evelina Dun A, FNP  lisinopril (ZESTRIL) 20 MG tablet TAKE 1 TABLET DAILY 10/02/19   Evelina Dun A, FNP  meclizine (ANTIVERT) 25 MG tablet Take 1 tablet (25 mg total) by mouth 3 (three) times daily as needed for dizziness. 01/15/20   Milton Ferguson, MD  pantoprazole (PROTONIX) 40 MG tablet TAKE 1 TABLET ONCE DAILY BEFORE BREAKFAST 09/14/19   Hawks, Alyse Low A, FNP  Vitamin D, Ergocalciferol, (DRISDOL) 1.25 MG (50000 UNIT) CAPS capsule Take 50,000 Units by mouth once a week. 05/29/19   [provider]    Allergies    Patient has no known allergies.  Review of Systems   Review of Systems  Constitutional: Negative for appetite change and fatigue.  HENT: Negative for congestion, ear discharge and sinus pressure.   Eyes: Negative for discharge.  Respiratory: Negative for cough.   Cardiovascular: Negative for chest pain.  Gastrointestinal: Negative for abdominal pain, blood in stool and diarrhea.  Genitourinary: Negative for frequency and hematuria.  Musculoskeletal: Negative for back pain.  Skin: Negative for rash.  Neurological: Positive for dizziness. Negative for seizures and headaches.  Psychiatric/Behavioral: Negative for hallucinations.    Physical Exam Updated Vital Signs BP 133/85   Pulse 98   Temp 98.3 F (36.8 C) (Oral)   Resp 15   Ht 5\' 7"  (1.702 m)   Wt 101.6 kg   SpO2 96%   BMI 35.08 kg/m   Physical Exam Vitals and  nursing note reviewed.  Constitutional:      Appearance: She is well-developed.  HENT:     Head: Normocephalic.     Nose: Nose normal.  Eyes:     General: No scleral icterus.    Conjunctiva/sclera: Conjunctivae normal.  Neck:     Thyroid: No thyromegaly.  Cardiovascular:     Rate and Rhythm: Normal rate and regular rhythm.     Heart sounds: No murmur heard.  No friction rub. No gallop.   Pulmonary:     Breath sounds: No stridor. No wheezing or rales.  Chest:     Chest wall: No tenderness.  Abdominal:     General: There is  no distension.     Tenderness: There is no abdominal tenderness. There is no rebound.  Genitourinary:    Comments: Hem neg Musculoskeletal:        General: Normal range of motion.     Cervical back: Neck supple.  Lymphadenopathy:     Cervical: No cervical adenopathy.  Skin:    Findings: No erythema or rash.  Neurological:     Mental Status: She is alert and oriented to person, place, and time.     Motor: No abnormal muscle tone.     Coordination: Coordination normal.  Psychiatric:        Behavior: Behavior normal.     ED Results / Procedures / Treatments   Labs (all labs ordered are listed, but only abnormal results are displayed) Labs Reviewed  CBC WITH DIFFERENTIAL/PLATELET - Abnormal; Notable for the following components:      Result Value   RBC 3.70 (*)    Hemoglobin 9.2 (*)    HCT 31.1 (*)    MCH 24.9 (*)    MCHC 29.6 (*)    All other components within normal limits  COMPREHENSIVE METABOLIC PANEL  OCCULT BLOOD X 1 CARD TO LAB, STOOL    EKG EKG Interpretation  Date/Time:  Friday January 15 2020 08:52:16 EDT Ventricular Rate:  120 PR Interval:    QRS Duration: 81 QT Interval:  316 QTC Calculation: 447 R Axis:   59 Text Interpretation: Sinus tachycardia Probable left atrial enlargement Confirmed by Milton Ferguson [phone]) on 01/15/2020 11:50:17 AM   Radiology MR BRAIN WO CONTRAST  Result Date: 01/15/2020 CLINICAL DATA:  Neuro  deficit, acute, stroke suspected. Additional provided: Patient reports dizziness since Wednesday. EXAM: MRI HEAD WITHOUT CONTRAST TECHNIQUE: Multiplanar, multiecho pulse sequences of the brain and surrounding structures were obtained without intravenous contrast. COMPARISON:  CT head 06/14/2018, brain MRI 05/04/2016. FINDINGS: Brain: Cerebral volume is normal for age. No focal parenchymal signal abnormality is identified. There is no acute infarct. No evidence of intracranial mass. No chronic intracranial blood products. No extra-axial fluid collection. No midline shift. Vascular: Expected proximal arterial flow voids. Skull and upper cervical spine: No focal marrow lesion. Susceptibility artifact from cervical spinal fusion hardware. Sinuses/Orbits: Visualized orbits show no acute finding. Mild ethmoid sinus mucosal thickening. No significant mastoid effusion. IMPRESSION: Unremarkable non-contrast MRI appearance of the brain. No evidence of acute intracranial abnormality, including acute infarction. Electronically Signed   By: Kellie Simmering DO   On: 01/15/2020 10:10    Procedures Procedures (including critical care time)  Medications Ordered in ED Medications  sodium chloride 0.9 % bolus 1,000 mL (0 mLs Intravenous Stopped 01/15/20 1233)  ondansetron (ZOFRAN) injection 4 mg (4 mg Intravenous Given 01/15/20 1021)  meclizine (ANTIVERT) tablet 25 mg (25 mg Oral Given 01/15/20 1232)    ED Course  I have reviewed the triage vital signs and the nursing notes.  Pertinent labs & imaging results that were available during my care of the patient were reviewed by me and considered in my medical decision making (see chart for details).    MDM Rules/Calculators/A&P                         Patient with some dizziness.  Patient has high blood pressure but has not been taking her medicine today.  She has worsening anemia with hemoglobin 9.2 rectal exam was heme-negative.  Patient will be treated with Antivert for  possible inner ear problem follow-up with PCP  This patient presents to the ED for concern of dizziness, this involves an extensive number of treatment options, and is a complaint that carries with it a high risk of complications and morbidity.  The differential diagnosis includes hypertension vertigo  Lab Tests:   I Ordered, reviewed, and interpreted labs, which included CBC and chemistries which showed mild anemia at 9.2  Medicines ordered:   I ordered medication Antivert for dizziness along with normal saline and Zofran  Imaging Studies ordered:   I ordered imaging studies which included MRI head which was unremarkable  I independently visualized and interpreted imaging which showed no acute disease  Additional history obtained:   Additional history obtained from records  Previous records obtained and reviewed.  Consultations Obtained:   Reevaluation:  After the interventions stated above, I reevaluated the patient and found improvement  Critical Interventions:  .   Final Clinical Impression(s) / ED Diagnoses Final diagnoses:  Dizziness  Other iron deficiency anemia    Rx / DC Orders ED Discharge Orders         Ordered    meclizine (ANTIVERT) 25 MG tablet  3 times daily PRN        01/15/20 1152           Milton Ferguson, MD 01/19/20 [phone]

## 2020-01-15 NOTE — Discharge Instructions (Addendum)
Take your blood pressure medicine when you get at home.  Follow-up with your family doctor next week for recheck.  Your blood counts a little bit low and needs to be followed.

## 2020-01-15 NOTE — ED Notes (Signed)
Dr Roderic Palau informed of arrival.

## 2020-01-15 NOTE — ED Triage Notes (Signed)
History of stroke 2016 with known slurred speech.  Pt reports being dizzy since Wednesday at 0730.  LKW was 2300 per pt.  C/o n/v since Wed.  Denies fever or SOB.

## 2020-01-15 NOTE — ED Notes (Signed)
Pt unable to lay flat for orthostatics.

## 2020-01-26 ENCOUNTER — Other Ambulatory Visit: Payer: Self-pay

## 2020-01-26 ENCOUNTER — Ambulatory Visit (INDEPENDENT_AMBULATORY_CARE_PROVIDER_SITE_OTHER): Payer: Medicare Other | Admitting: Nurse Practitioner

## 2020-01-26 ENCOUNTER — Encounter: Payer: Self-pay | Admitting: Nurse Practitioner

## 2020-01-26 VITALS — BP 132/85 | HR 108 | Temp 98.1°F | Ht 67.0 in | Wt 230.6 lb

## 2020-01-26 DIAGNOSIS — I10 Essential (primary) hypertension: Secondary | ICD-10-CM | POA: Diagnosis not present

## 2020-01-26 DIAGNOSIS — R42 Dizziness and giddiness: Secondary | ICD-10-CM | POA: Diagnosis not present

## 2020-01-26 NOTE — Assessment & Plan Note (Signed)
Patient presents for follow-up after ED visit for vertigo in the last 7 days.  Symptoms remain unchanged.  Patient was given meclizine 25 mg 3 times daily.  Patient has not used medication because she received it yesterday.  Patient is reporting nausea, vomiting, headache, room spinning, numbness and tingling with difficulty walking.  Provided education to patient to take medication as prescribed, use caution when rising and sitting down.  Printed handouts given.  Referral to physical therapy.  Advised patient to follow-up with worsening or unresolved symptoms.

## 2020-01-26 NOTE — Assessment & Plan Note (Signed)
Patient's hypertension is well controlled on current medication regimen.  No changes to medication dose necessary.  Advised patient to continue on a low-salt healthy diet with exercise.  Current medication lisinopril 20 mg daily.  Patient is compliant with medication, patient is not reporting any side effects from medication, no signs and symptoms of hypo or hypertension.  And follows up as directed.

## 2020-01-26 NOTE — Patient Instructions (Signed)

## 2020-01-26 NOTE — Progress Notes (Signed)
Established Patient Office Visit  Subjective:  Patient ID: Brittany Durham, female    DOB: November 28, 1970  Age: 49 y.o. MRN: [id number]  CC:  Chief Complaint  Patient presents with  . Hospitalization Follow-up    ER follow up from Dizziness     HPI Brittany Durham presents for Dizziness This is a new problem. The current episode started in the past 7 days. The problem has been unchanged. Associated symptoms include nausea, numbness, vertigo and vomiting. Pertinent negatives include no fatigue, fever or headaches. The symptoms are aggravated by walking, bending and standing. She has tried nothing for the symptoms.  Patient went to the ED for vertigo and was started on meclizine 25 mg tablets by mouth 3 times daily as needed for dizziness.  Patient reports she has not started medication because he came in the mail yesterday.   Pt presents for follow up of hypertension. Patient was diagnosed in 03/01/2020. The patient is tolerating the medication well without side effects. Compliance with treatment has been good; including taking medication as directed , maintains a healthy diet and regular exercise regimen , and following up as directed.  Current medication lisinopril 20 mg daily.  Past Medical History:  Diagnosis Date  . Asthma    childhood  . Carotidynia   . DDD (degenerative disc disease), cervical   . Hypertension   . Knee pain    left meniscus tear s/p repair 11/2014  . Seizures ([place])   . Stuttering    patient reports this started after her knee surgery in 11/2014.   Marland Kitchen Thyroid nodule     Past Surgical History:  Procedure Laterality Date  . ANTERIOR CERVICAL DECOMP/DISCECTOMY FUSION N/A 03/20/2019   Procedure: Anterior Cervical Decompression Fusion - Cervical Four-Cervical Five - Cervical Five-Cervical Six;  Surgeon: Eustace Moore, MD;  Location: [place];  Service: Neurosurgery;  Laterality: N/A;  Anterior Cervical Decompression Fusion - Cervical Four-Cervical Five - Cervical  Five-Cervical Six  . COLONOSCOPY N/A 08/23/2016   Procedure: COLONOSCOPY;  Surgeon: Daneil Dolin, MD;  [facility];  Service: Endoscopy;  Laterality: N/A;  1:00pm  . ESOPHAGOGASTRODUODENOSCOPY N/A 05/03/2016   Procedure: ESOPHAGOGASTRODUODENOSCOPY (EGD);  Surgeon: Daneil Dolin, MD;  [facility];  Service: Endoscopy;  Laterality: N/A;  . ESOPHAGOGASTRODUODENOSCOPY N/A 08/23/2016   Procedure: ESOPHAGOGASTRODUODENOSCOPY (EGD);  Surgeon: Daneil Dolin, MD;  [facility];  Service: Endoscopy;  Laterality: N/A;  . KNEE ARTHROSCOPY WITH MEDIAL MENISECTOMY Left 12/09/2014   Procedure: KNEE ARTHROSCOPY WITH PARTIAL MEDIAL AND LATERAL MENISECTOMY;  Surgeon: Sanjuana Kava, MD;  Location[facility];  Service: Orthopedics;  Laterality: Left;  . TUBAL LIGATION    . WISDOM TOOTH EXTRACTION      Family History  Problem Relation Age of Onset  . Hypertension Mother   . Lung cancer Father   . Hypertension Brother   . Colon cancer Neg Hx   . Gastric cancer Neg Hx   . Esophageal cancer Neg Hx     Social History   Socioeconomic History  . Marital status: Single    Spouse name: Not on file  . Number of children: 1  . Years of education: 38  . Highest education level: Not on file  Occupational History  . Occupation: Umemployed    Comment: Disability  Tobacco Use  . Smoking status: Never Smoker  . Smokeless tobacco: Never Used  Vaping Use  . Vaping Use: Never used  Substance and Sexual Activity  . Alcohol  use: No  . Drug use: No  . Sexual activity: Not Currently    Birth control/protection: Surgical  Other Topics Concern  . Not on file  Social History Narrative   Lives at home alone.   Right-handed.   Occasional caffeine use.   Social Determinants of Health                                                                         Outpatient Medications Prior to Visit  Medication Sig Dispense Refill  . amLODipine (NORVASC) 10 MG  tablet TAKE 1 TABLET ONCE DAILY 90 tablet 1  . baclofen (LIORESAL) 10 MG tablet Take 1 tablet (10 mg total) by mouth 3 (three) times daily. 90 each 3  . diclofenac (VOLTAREN) 75 MG EC tablet Take 75 mg by mouth daily.    Marland Kitchen escitalopram (LEXAPRO) 10 MG tablet Take 1 tablet (10 mg total) by mouth daily. 90 tablet 3  . lisinopril (ZESTRIL) 20 MG tablet TAKE 1 TABLET DAILY 90 tablet 1  . meclizine (ANTIVERT) 25 MG tablet Take 1 tablet (25 mg total) by mouth 3 (three) times daily as needed for dizziness. 15 tablet 0  . pantoprazole (PROTONIX) 40 MG tablet TAKE 1 TABLET ONCE DAILY BEFORE BREAKFAST 30 tablet 5  . Vitamin D, Ergocalciferol, (DRISDOL) 1.25 MG (50000 UNIT) CAPS capsule Take 50,000 Units by mouth once a week.     No facility-administered medications prior to visit.    No Known Allergies  ROS Review of Systems  Constitutional: Negative for fatigue and fever.  HENT:       Vertigo  Gastrointestinal: Positive for nausea and vomiting.  Neurological: Positive for dizziness, vertigo and numbness. Negative for headaches.  All other systems reviewed and are negative.     Objective:    Physical Exam Vitals reviewed.  Constitutional:      Appearance: Normal appearance.  HENT:     Head: Normocephalic.     Right Ear: External ear normal. There is no impacted cerumen.     Left Ear: External ear normal. There is no impacted cerumen.  Eyes:     Conjunctiva/sclera: Conjunctivae normal.  Cardiovascular:     Rate and Rhythm: Normal rate and regular rhythm.     Pulses: Normal pulses.     Heart sounds: Normal heart sounds.  Pulmonary:     Effort: Pulmonary effort is normal.     Breath sounds: Normal breath sounds.  Abdominal:     General: Bowel sounds are normal.  Musculoskeletal:     Comments: Slight weakness patient needs support to walk to avoid falling.  Skin:    General: Skin is warm.  Neurological:     Mental Status: She is alert and oriented to person, place, and time.    Psychiatric:        Mood and Affect: Mood normal.        Behavior: Behavior normal.     BP 132/85   Pulse (!) 108   Temp 98.1 F (36.7 C) (Temporal)   Ht 5\' 7"  (1.702 m)   Wt 230 lb 9.6 oz (104.6 kg)   SpO2 95%   BMI 36.12 kg/m  Wt Readings from Last 3 Encounters:  01/26/20 230 lb  9.6 oz (104.6 kg)  01/15/20 224 lb (101.6 kg)  12/02/19 224 lb 3.2 oz (101.7 kg)     Health Maintenance Due  Topic Date Due  . MAMMOGRAM  Never done  . TETANUS/TDAP  Never done    There are no preventive care reminders to display for this patient.  Lab Results  Component Value Date   TSH 2.660 06/30/2018   Lab Results  Component Value Date   WBC 7.0 01/15/2020   HGB 9.2 (L) 01/15/2020   HCT 31.1 (L) 01/15/2020   MCV 84.1 01/15/2020   PLT 231 01/15/2020   Lab Results  Component Value Date   NA 138 01/15/2020   K 3.7 01/15/2020   CO2 26 01/15/2020   GLUCOSE 95 01/15/2020   BUN 14 01/15/2020   CREATININE 0.64 01/15/2020   BILITOT 0.6 01/15/2020   ALKPHOS 98 01/15/2020   AST 15 01/15/2020   ALT 23 01/15/2020   PROT 7.6 01/15/2020   ALBUMIN 3.7 01/15/2020   CALCIUM 9.3 01/15/2020   ANIONGAP 10 01/15/2020   Lab Results  Component Value Date   CHOL 196 01/29/2019   Lab Results  Component Value Date   HDL 58 01/29/2019   Lab Results  Component Value Date   LDLCALC 121 (H) 01/29/2019   Lab Results  Component Value Date   TRIG 93 01/29/2019   Lab Results  Component Value Date   CHOLHDL 3.4 01/29/2019   Lab Results  Component Value Date   HGBA1C 5.1 12/12/2014      Assessment & Plan:   Problem List Items Addressed This Visit      Cardiovascular and Mediastinum   HYPERTENSION, BENIGN    Patient's hypertension is well controlled on current medication regimen.  No changes to medication dose necessary.  Advised patient to continue on a low-salt healthy diet with exercise.  Current medication lisinopril 20 mg daily.  Patient is compliant with medication, patient  is not reporting any side effects from medication, no signs and symptoms of hypo or hypertension.  And follows up as directed.        Other   Vertigo - Primary    Patient presents for follow-up after ED visit for vertigo in the last 7 days.  Symptoms remain unchanged.  Patient was given meclizine 25 mg 3 times daily.  Patient has not used medication because she received it yesterday.  Patient is reporting nausea, vomiting, headache, room spinning, numbness and tingling with difficulty walking.  Provided education to patient to take medication as prescribed, use caution when rising and sitting down.  Printed handouts given.  Referral to physical therapy.  Advised patient to follow-up with worsening or unresolved symptoms.            No orders of the defined types were placed in this encounter.   Follow-up: Return if symptoms worsen or fail to improve.    Ivy Lynn, NP

## 2020-02-19 ENCOUNTER — Telehealth: Payer: Self-pay

## 2020-02-19 NOTE — Telephone Encounter (Signed)
Return back [name], looks like patient was asking for a referral that she spoke to with about physical therapy, please place that if that is appropriate.

## 2020-02-19 NOTE — Telephone Encounter (Signed)
Called to get update on status of pts referral regarding her head. Says its been at least 3 wks and they have not heard anything from anyone. Wants info asap.

## 2020-02-19 NOTE — Telephone Encounter (Signed)
This is one of Je's patients.  She was seen by her on 01/26/2020 for an ER followup for vertigo.  Her notes state she was going to be referred for physical therapy but I do not see a referral.  Can you take care of this please?

## 2020-02-21 ENCOUNTER — Other Ambulatory Visit: Payer: Self-pay | Admitting: Nurse Practitioner

## 2020-02-21 DIAGNOSIS — R42 Dizziness and giddiness: Secondary | ICD-10-CM

## 2020-02-23 ENCOUNTER — Other Ambulatory Visit: Payer: Self-pay | Admitting: Family

## 2020-02-23 DIAGNOSIS — R0789 Other chest pain: Secondary | ICD-10-CM

## 2020-02-23 DIAGNOSIS — R072 Precordial pain: Secondary | ICD-10-CM

## 2020-02-23 DIAGNOSIS — K279 Peptic ulcer, site unspecified, unspecified as acute or chronic, without hemorrhage or perforation: Secondary | ICD-10-CM

## 2020-02-23 NOTE — Telephone Encounter (Signed)
Referral sent 02/21/20. Thanks

## 2020-02-25 ENCOUNTER — Encounter (INDEPENDENT_AMBULATORY_CARE_PROVIDER_SITE_OTHER): Payer: Medicare Other | Admitting: Family

## 2020-02-25 ENCOUNTER — Encounter: Payer: Self-pay | Admitting: Family

## 2020-02-25 NOTE — Progress Notes (Signed)
   Virtual Visit via telephone Note Due to COVID-19 pandemic this visit was conducted virtually. This visit type was conducted due to national recommendations for restrictions regarding the COVID-19 Pandemic (e.g. social distancing, sheltering in place) in an effort to limit this patient's exposure and mitigate transmission in our community. All issues noted in this document were discussed and addressed.  A physical exam was not performed with this format.   Called patient multiple times no answer. Called daughter and she states she she would try to call three-way. We got disconnected and no answer on call back. Will close chart.     Evelina Dun, FNP

## 2020-02-29 ENCOUNTER — Ambulatory Visit: Payer: Medicare Other | Admitting: Family

## 2020-02-29 ENCOUNTER — Ambulatory Visit (HOSPITAL_COMMUNITY): Payer: Medicare Other | Admitting: Physical Therapy

## 2020-03-08 ENCOUNTER — Encounter: Payer: Self-pay | Admitting: Family

## 2020-03-08 ENCOUNTER — Ambulatory Visit (INDEPENDENT_AMBULATORY_CARE_PROVIDER_SITE_OTHER): Payer: Medicare Other | Admitting: Family

## 2020-03-08 DIAGNOSIS — K219 Gastro-esophageal reflux disease without esophagitis: Secondary | ICD-10-CM | POA: Diagnosis not present

## 2020-03-08 DIAGNOSIS — F411 Generalized anxiety disorder: Secondary | ICD-10-CM

## 2020-03-08 DIAGNOSIS — I10 Essential (primary) hypertension: Secondary | ICD-10-CM

## 2020-03-08 DIAGNOSIS — M150 Primary generalized (osteo)arthritis: Secondary | ICD-10-CM

## 2020-03-08 DIAGNOSIS — M8949 Other hypertrophic osteoarthropathy, multiple sites: Secondary | ICD-10-CM | POA: Diagnosis not present

## 2020-03-08 DIAGNOSIS — R42 Dizziness and giddiness: Secondary | ICD-10-CM | POA: Diagnosis not present

## 2020-03-08 DIAGNOSIS — E559 Vitamin D deficiency, unspecified: Secondary | ICD-10-CM | POA: Diagnosis not present

## 2020-03-08 DIAGNOSIS — M159 Polyosteoarthritis, unspecified: Secondary | ICD-10-CM

## 2020-03-08 DIAGNOSIS — R479 Unspecified speech disturbances: Secondary | ICD-10-CM | POA: Diagnosis not present

## 2020-03-08 DIAGNOSIS — G4700 Insomnia, unspecified: Secondary | ICD-10-CM | POA: Diagnosis not present

## 2020-03-08 DIAGNOSIS — F809 Developmental disorder of speech and language, unspecified: Secondary | ICD-10-CM | POA: Diagnosis not present

## 2020-03-08 NOTE — Progress Notes (Signed)
Virtual Visit via telephone Note Due to COVID-19 pandemic this visit was conducted virtually. This visit type was conducted due to national recommendations for restrictions regarding the COVID-19 Pandemic (e.g. social distancing, sheltering in place) in an effort to limit this patient's exposure and mitigate transmission in our community. All issues noted in this document were discussed and addressed.  A physical exam was not performed with this format.  I connected with Brittany Durham on 03/08/20 at 11:46 AM  by telephone and verified that I am speaking with the correct person using two identifiers. Brittany Durham is currently located at store and no one is currently with her during visit. The provider, Evelina Dun, FNP is located in their office at time of visit.  I discussed the limitations, risks, security and privacy concerns of performing an evaluation and management service by telephone and the availability of in person appointments. I also discussed with the patient that there may be a patient responsible charge related to this service. The patient expressed understanding and agreed to proceed.   History and Present Illness:  PT presents to the office today forchronic follow up. Pt is followed by Neurologists for Speech difficulties.She is followed by GI every 6 months for GERD. She is complaining of intermittent dizziness that started 2 months ago. She had a MR brain on 01/15/20 that was negative.  Hypertension This is a chronic problem. The current episode started more than 1 year ago. The problem has been resolved since onset. The problem is controlled. Associated symptoms include anxiety and malaise/fatigue. Pertinent negatives include no peripheral edema or shortness of breath. Risk factors for coronary artery disease include dyslipidemia, obesity and sedentary lifestyle. The current treatment provides moderate improvement.  Gastroesophageal Reflux She complains of belching and  heartburn. This is a chronic problem. The current episode started more than 1 year ago. The problem occurs occasionally. The problem has been waxing and waning. She has tried a PPI for the symptoms. The treatment provided moderate relief.  Arthritis Presents for follow-up visit. She complains of pain and stiffness. The symptoms have been stable. Affected locations include the right knee and left knee. Her pain is at a severity of 10/10.  Insomnia Primary symptoms: difficulty falling asleep, malaise/fatigue.  The current episode started more than one year. The onset quality is gradual. The problem occurs intermittently. The problem has been waxing and waning since onset.  Anxiety Presents for follow-up visit. Symptoms include depressed mood, excessive worry, insomnia, irritability, nervous/anxious behavior and restlessness. Patient reports no shortness of breath. Symptoms occur occasionally. The severity of symptoms is moderate. The quality of sleep is good.        Review of Systems  Constitutional: Positive for irritability and malaise/fatigue.  Respiratory: Negative for shortness of breath.   Gastrointestinal: Positive for heartburn.  Musculoskeletal: Positive for arthritis and stiffness.  Psychiatric/Behavioral: The patient is nervous/anxious and has insomnia.      Observations/Objective: No SOB or distress noted, stuttering   Assessment and Plan: Brittany Durham comes in today with chief complaint of No chief complaint on file.   Diagnosis and orders addressed:  1. HYPERTENSION, BENIGN  2. Gastroesophageal reflux disease without esophagitis  3. Primary osteoarthritis involving multiple joints  4. GAD (generalized anxiety disorder)  5. Insomnia, unspecified type  6. Language difficult  7. Speech disorder  8. Vitamin D deficiency  9. Dizziness -Letter given that she would benefit from lower apartment   Labs reviewed from 01/15/20 Health Maintenance reviewed Diet  and exercise  encouraged  Follow up plan: 6 months      I discussed the assessment and treatment plan with the patient. The patient was provided an opportunity to ask questions and all were answered. The patient agreed with the plan and demonstrated an understanding of the instructions.   The patient was advised to call back or seek an in-person evaluation if the symptoms worsen or if the condition fails to improve as anticipated.  The above assessment and management plan was discussed with the patient. The patient verbalized understanding of and has agreed to the management plan. Patient is aware to call the clinic if symptoms persist or worsen. Patient is aware when to return to the clinic for a follow-up visit. Patient educated on when it is appropriate to go to the emergency department.   Time call ended:  12:00 pm   I provided 14 minutes of non-face-to-face time during this encounter.    Evelina Dun, FNP

## 2020-03-11 ENCOUNTER — Ambulatory Visit: Payer: Medicare Other | Admitting: Orthopaedic Surgery

## 2020-03-15 ENCOUNTER — Ambulatory Visit: Payer: Medicare Other | Admitting: Orthopaedic Surgery

## 2020-03-15 ENCOUNTER — Other Ambulatory Visit: Payer: Self-pay

## 2020-03-15 ENCOUNTER — Ambulatory Visit (HOSPITAL_COMMUNITY): Payer: Medicare Other | Attending: Nurse Practitioner | Admitting: Physical Therapy

## 2020-03-15 ENCOUNTER — Encounter (HOSPITAL_COMMUNITY): Payer: Self-pay | Admitting: Physical Therapy

## 2020-03-15 DIAGNOSIS — M542 Cervicalgia: Secondary | ICD-10-CM | POA: Insufficient documentation

## 2020-03-15 DIAGNOSIS — R293 Abnormal posture: Secondary | ICD-10-CM

## 2020-03-15 DIAGNOSIS — R42 Dizziness and giddiness: Secondary | ICD-10-CM | POA: Diagnosis not present

## 2020-03-15 NOTE — Therapy (Signed)
[facility] [address] [place], [place], [address] Phone: [phone]   Fax:  [phone]  Physical Therapy Evaluation  Patient Details  Name: Brittany Durham MRN: [id number] Date of Birth: 10/09/1970 Referring Provider (PT): Jac Canavan NP   Encounter Date: 03/15/2020   PT End of Session - 03/15/20 1100    Visit Number 1    Number of Visits 8    Date for PT Re-Evaluation 04/15/20    Authorization Type Medicare A/ Medicaid secondary    PT Start Time 0955    PT Stop Time 1035    PT Time Calculation (min) 40 min    Activity Tolerance Patient tolerated treatment well    Behavior During Therapy [facility] for tasks assessed/performed           Past Medical History:  Diagnosis Date  . Asthma    childhood  . Carotidynia   . DDD (degenerative disc disease), cervical   . Hypertension   . Knee pain    left meniscus tear s/p repair 11/2014  . Seizures ([place])   . Stuttering    patient reports this started after her knee surgery in 11/2014.   Marland Kitchen Thyroid nodule     Past Surgical History:  Procedure Laterality Date  . ANTERIOR CERVICAL DECOMP/DISCECTOMY FUSION N/A 03/20/2019   Procedure: Anterior Cervical Decompression Fusion - Cervical Four-Cervical Five - Cervical Five-Cervical Six;  Surgeon: Eustace Moore, MD;  Location: [facility];  Service: Neurosurgery;  Laterality: N/A;  Anterior Cervical Decompression Fusion - Cervical Four-Cervical Five - Cervical Five-Cervical Six  . COLONOSCOPY N/A 08/23/2016   Procedure: COLONOSCOPY;  Surgeon: Daneil Dolin, MD;  [facility];  Service: Endoscopy;  Laterality: N/A;  1:00pm  . ESOPHAGOGASTRODUODENOSCOPY N/A 05/03/2016   Procedure: ESOPHAGOGASTRODUODENOSCOPY (EGD);  Surgeon: Daneil Dolin, MD;  [facility];  Service: Endoscopy;  Laterality: N/A;  . ESOPHAGOGASTRODUODENOSCOPY N/A 08/23/2016   Procedure: ESOPHAGOGASTRODUODENOSCOPY (EGD);  Surgeon: Daneil Dolin, MD;  [facility];  Service: Endoscopy;  Laterality: N/A;  . KNEE ARTHROSCOPY WITH MEDIAL MENISECTOMY Left 12/09/2014   Procedure: KNEE ARTHROSCOPY WITH PARTIAL MEDIAL AND LATERAL MENISECTOMY;  Surgeon: Sanjuana Kava, MD;  [facility];  Service: Orthopedics;  Laterality: Left;  . TUBAL LIGATION    . WISDOM TOOTH EXTRACTION      There were no vitals filed for this visit.    Subjective Assessment - 03/15/20 1011    Subjective Patient presents to physical therapy with complaint of dizziness. Says she woke up about 2 months ago and her head was spinning. Says this has been going on ever since. Had MRI done of brain in September which was unremarkable, no stroke indicated. Patient denies onset of limb weakness, but says she had pain in her neck and numbness in her LT arm. Patient does report history of neck surgery and patient chart denotes prior treatments for cervical radiculopathy. Patient was prescribed Antivert which she says has helped a little. Patient denies any previous episodes of ongoing dizziness.    Pertinent History ACDF 03/2019, Bilateral TKA, HTN, Asthma, some level of expressive/ speech difficulty    Diagnostic tests MRI    Patient Stated Goals I want dizziness to go away    Currently in Pain? No/denies              [facility] PT Assessment - 03/15/20 0001      Assessment   Medical Diagnosis Dizziness     Referring  Provider (PT) Jac Canavan NP    Onset Date/Surgical Date --   01/2020   Prior Therapy Not for dizziness      Precautions   Precautions Fall      Restrictions   Weight Bearing Restrictions No      Balance Screen   Has the patient fallen in the past 6 months Yes    How many times? [place] residence    Living Arrangements Alone      Prior Function   Level of [place] with basic ADLs   Daughter helps with community ADLS, MD appts etc      Cognition   Overall Cognitive Status Difficult to assess    Expressive difficulty, diagnosis unknown      Sensation   Light Touch Appears Intact      Posture/Postural Control   Posture/Postural Control Postural limitations    Postural Limitations Rounded Shoulders;Forward head      ROM / Strength   AROM / PROM / Strength Strength      Strength   Overall Strength Comments BUE and BLE 4+/5 grossly in seated position                  Vestibular Assessment - 03/15/20 0001      Symptom Behavior   Type of Dizziness  Spinning    Frequency of Dizziness 3-4 x week     Duration of Dizziness 3-5 minutes     Symptom Nature Variable    Aggravating Factors No known aggravating factors    Relieving Factors Lying supine;Head stationary;Closing eyes    Progression of Symptoms No change since onset    History of similar episodes None reported      Oculomotor Exam   Ocular ROM WFL     Saccades Slow;Poor trajectory   difficulty looking RT, provoked dizziness     Vestibulo-Ocular Reflex   VOR 1 Head Only (x 1 viewing) Difficulty turning RT, provokes dizziness and neck pain               Objective measurements completed on examination: See above findings.        Vestibular Treatment/Exercise - 03/15/20 0001      Vestibular Treatment/Exercise   Gaze Exercises Comment      Eye/Head Exercise Vertical   Comment saccades seated x10; seated VOR 1 x 10                  PT Education - 03/15/20 1016    Education Details on evaluation findings, POC and HEP    Person(s) Educated Patient    Methods Explanation;Handout    Comprehension Verbalized understanding            PT Short Term Goals - 03/15/20 1107      PT SHORT TERM GOAL #1   Title Patient will be independent with initial HEP and self-management strategies to improve functional outcomes    Time 2    Period Weeks    Status New    Target Date 04/01/20             PT Long Term Goals - 03/15/20 1108      PT LONG TERM GOAL #1   Title Patient will report at  least 60% overall improvement in subjective complaint to indicate improvement in ability to perform ADLs.    Time 4    Period Weeks    Status New  Target Date 04/15/20      PT LONG TERM GOAL #2   Title Patient will report no new falls since starting therapy for improved functional outcomes and decreased risk of injury    Time 4    Period Weeks    Status New    Target Date 04/15/20      PT LONG TERM GOAL #3   Title Patient will be independent with final HEP and self-management strategies to improve functional outcomes    Time 4    Period Weeks    Status New    Target Date 04/15/20                  Plan - 03/15/20 1101    Clinical Impression Statement Patient is a 49 y.o. female who presents to physical therapy with complaint of dizziness. Patient demonstrates decreased perceived functional ability, increased symptoms with vestibular testing and postural abnormalities which are negatively impacting patient ability to perform ADLs and functional mobility tasks. Patient will benefit from skilled physical therapy services to address these deficits to improve level of function with ADLs, functional mobility tasks, and reduce risk for falls.    Personal Factors and Comorbidities Comorbidity 2    Comorbidities HTN, asthma    Examination-Activity Limitations Stand;Locomotion Level    Examination-Participation Restrictions Art gallery manager;Yard Work    Stability/Clinical Decision Making Stable/Uncomplicated    Designer, jewellery Low    Rehab Potential Good    PT Frequency 2x / week    PT Duration 4 weeks    PT Treatment/Interventions ADLs/Self Care Home Management;Aquatic Therapy;Biofeedback;Cryotherapy;Electrical Stimulation;Iontophoresis 4mg /ml Dexamethasone;Moist Heat;Traction;Ultrasound;Parrafin;Therapeutic activities;Patient/family education;Fluidtherapy;Manual lymph drainage;Manual techniques;Neuromuscular re-education;Balance training;Therapeutic  exercise;Contrast Bath;Compression bandaging;Scar mobilization;Taping;Joint Manipulations;Vasopneumatic Device;Vestibular;Visual/perceptual remediation/compensation;Passive range of motion;Dry needling;Energy conservation;Spinal Manipulations;Splinting;Orthotic Fit/Training;DME Instruction;Gait training;Stair training;Canalith Repostioning;Functional mobility training    PT Next Visit Plan Review goals and HEP. Assess static balance. Progress balance activity from static to dynamic, and habituation exercise for dizziness as tolerated. Incorporate postural strengthening to address posture restrictions which may be contributing to limitations as well.    PT Home Exercise Plan 03/15/20: saccades, VOR 1    Consulted and Agree with Plan of Care Patient           Patient will benefit from skilled therapeutic intervention in order to improve the following deficits and impairments:  Decreased balance, Impaired perceived functional ability, Dizziness  Visit Diagnosis: Dizziness and giddiness  Abnormal posture     Problem List Patient Active Problem List   Diagnosis Date Noted  . Vertigo 01/26/2020  . Preventative health care 12/02/2019  . Diarrhea 09/28/2019  . GAD (generalized anxiety disorder) 07/28/2019  . S/P cervical spinal fusion 03/20/2019  . Chest pain 12/31/2018  . Osteoarthritis 11/18/2017  . Insomnia 11/18/2017  . 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 Tear of lateral meniscus of right knee, current 10/28/2017  . 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 Language difficulty 09/25/2016  . Nausea with vomiting 07/18/2016  . PUD (peptic ulcer disease) 07/18/2016  . Slurred speech   . GI bleed 05/02/2016  . Upper GI bleed 05/02/2016  . Sinus tachycardia 05/02/2016  . Acute blood loss anemia 05/02/2016  . Abdominal pain, epigastric 05/02/2016  . 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 Thyroid nodule 12/14/2014  . Left knee pain 12/14/2014  . Seizures ([place])   .  Altered mental status 12/12/2014  . Vitamin D deficiency 06/30/2014  . GERD (gastroesophageal reflux disease) 06/29/2014  . SYNCOPE 03/02/2010  . HYPERTENSION, BENIGN 03/01/2010  11:13 AM, 03/15/20 Josue Hector PT DPT  Physical Therapist with [facility]  [phone] [place] [address] [place], [place], [address] Phone: [phone]   Fax:  [phone]  Name: Brittany Durham MRN: [id number] Date of Birth: [birth year]

## 2020-03-15 NOTE — Patient Instructions (Signed)
Access Code: [email or web address] URL: [email or web address] Date: 03/15/2020 Prepared by: Josue Hector  Program Notes Begin with 1 set of 10 each. Progress to 2 sets of each exercise as dizziness symptoms decrease.    Exercises Seated Horizontal Saccades - 3 x daily - 7 x weekly - 1-2 sets - 10 reps Seated Gaze Stabilization with Head Rotation - 3 x daily - 7 x weekly - 1-2 sets - 10 reps

## 2020-03-18 ENCOUNTER — Ambulatory Visit (HOSPITAL_COMMUNITY): Payer: Medicare Other | Admitting: Physical Therapy

## 2020-03-18 ENCOUNTER — Telehealth (HOSPITAL_COMMUNITY): Payer: Self-pay | Admitting: Physical Therapy

## 2020-03-18 NOTE — Telephone Encounter (Signed)
pt's dtr called to cx this appt due to no transportation

## 2020-03-22 ENCOUNTER — Telehealth (HOSPITAL_COMMUNITY): Payer: Self-pay | Admitting: Physical Therapy

## 2020-03-22 ENCOUNTER — Ambulatory Visit (INDEPENDENT_AMBULATORY_CARE_PROVIDER_SITE_OTHER): Payer: Medicare Other | Admitting: Orthopaedic Surgery

## 2020-03-22 ENCOUNTER — Encounter: Payer: Self-pay | Admitting: Orthopaedic Surgery

## 2020-03-22 ENCOUNTER — Ambulatory Visit (INDEPENDENT_AMBULATORY_CARE_PROVIDER_SITE_OTHER): Payer: Medicare Other

## 2020-03-22 ENCOUNTER — Ambulatory Visit (HOSPITAL_COMMUNITY): Payer: Medicare Other | Admitting: Physical Therapy

## 2020-03-22 VITALS — Ht 67.0 in | Wt 220.0 lb

## 2020-03-22 DIAGNOSIS — M25561 Pain in right knee: Secondary | ICD-10-CM

## 2020-03-22 MED ORDER — LIDOCAINE HCL 1 % IJ SOLN
0.5000 mL | INTRAMUSCULAR | Status: AC | PRN
  Administered 2020-03-22: .5 mL

## 2020-03-22 MED ORDER — BUPIVACAINE HCL 0.25 % IJ SOLN
4.0000 mL | INTRAMUSCULAR | Status: AC | PRN
  Administered 2020-03-22: 4 mL via INTRA_ARTICULAR

## 2020-03-22 MED ORDER — METHYLPREDNISOLONE ACETATE 40 MG/ML IJ SUSP
40.0000 mg | INTRAMUSCULAR | Status: AC | PRN
  Administered 2020-03-22: 40 mg via INTRA_ARTICULAR

## 2020-03-22 NOTE — Progress Notes (Signed)
Office Visit Note   Patient: Brittany Durham           Date of Birth: [birth year]           MRN: [id number] Visit Date: 03/22/2020              Requested by: Sharion Balloon, [address] [place],  [place] [address] PCP: Sharion Balloon, FNP   Assessment & Plan: Visit Diagnoses:  1. Acute pain of right knee     Plan: We discussed this is unlikely recurrent PVNS after 2 years.  We discussed options proceed with intra-articular cortisone injection.  She has persistent problems will need to proceed with MRI scan.  Follow-Up Instructions: No follow-ups on file.   Orders:  Orders Placed This Encounter  Procedures  . XR KNEE 3 VIEW RIGHT   No orders of the defined types were placed in this encounter.     Procedures: Large Joint Inj: R knee on 03/22/2020 3:02 PM Indications: pain and joint swelling Details: 22 G 1.5 in needle, anterolateral approach  Arthrogram: No  Medications: 40 mg methylPREDNISolone acetate 40 MG/ML; 0.5 mL lidocaine 1 %; 4 mL bupivacaine 0.25 % Outcome: tolerated well, no immediate complications Procedure, treatment alternatives, risks and benefits explained, specific risks discussed. Consent was given by the patient. Immediately prior to procedure a time out was called to verify the correct patient, procedure, equipment, support staff and site/side marked as required. Patient was prepped and draped in the usual sterile fashion.       Clinical Data: No additional findings.   Subjective: Chief Complaint  Patient presents with  . Right Knee - Pain    HPI 49 year old female post knee arthroscopy 12/02/2017 after a fall with findings of localized PVNS anterior knee joint anterior to the ACL after traumatic fall.  She states she been doing well up until 2 weeks ago when she woke up with pain in her knee.  She did have a fall more than a month ago but does not think that it caused any problems.  She has been using Tylenol IcyHot patches.  Knee  feels weak states he feels like his skin to give way.  Patient did have some lateral meniscal tearing at the time of the injury but now her pain appears to be primarily anterior.  She is amatory with a limp no true locking.  The end of the day the knee is more swollen it is difficult for her to walk.  She is not used a cane.  Review of Systems all other systems are noncontributory.  No history of gout.  Positive for localized PVNS right knee intercondylar notch area anterior to the ACL.   Objective: Vital Signs: Ht 5\' 7"  (1.702 m)   Wt 220 lb (99.8 kg)   BMI 34.46 kg/m   Physical Exam Constitutional:      Appearance: She is well-developed.  HENT:     Head: Normocephalic.     Right Ear: External ear normal.     Left Ear: External ear normal.  Eyes:     Pupils: Pupils are equal, round, and reactive to light.  Neck:     Thyroid: No thyromegaly.     Trachea: No tracheal deviation.  Cardiovascular:     Rate and Rhythm: Normal rate.  Pulmonary:     Effort: Pulmonary effort is normal.  Abdominal:     Palpations: Abdomen is soft.  Skin:    General: Skin is warm and dry.  Neurological:     Mental Status: She is alert and oriented to person, place, and time.  Psychiatric:        Behavior: Behavior normal.     Ortho Exam patient has tenderness anterolateral joint line.  Some crepitus with knee extension she is amatory with a pronounced right knee limp.  Negative logroll of the hips no sciatic notch tenderness no palpable Baker's cyst.  Collateral ligaments are stable.  Negative Lachman.  Specialty Comments:  No specialty comments available.  Imaging: No results found.   PMFS History: Patient Active Problem List   Diagnosis Date Noted  . Vertigo 01/26/2020  . Preventative health care 12/02/2019  . Diarrhea 09/28/2019  . GAD (generalized anxiety disorder) 07/28/2019  . S/P cervical spinal fusion 03/20/2019  . Chest pain 12/31/2018  . Osteoarthritis 11/18/2017  . Insomnia  11/18/2017  . 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 Tear of lateral meniscus of right knee, current 10/28/2017  . 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 Language difficulty 09/25/2016  . Nausea with vomiting 07/18/2016  . PUD (peptic ulcer disease) 07/18/2016  . Slurred speech   . GI bleed 05/02/2016  . Upper GI bleed 05/02/2016  . Sinus tachycardia 05/02/2016  . Acute blood loss anemia 05/02/2016  . Abdominal pain, epigastric 05/02/2016  . 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 Thyroid nodule 12/14/2014  . Left knee pain 12/14/2014  . Seizures ([place])   . Altered mental status 12/12/2014  . Vitamin D deficiency 06/30/2014  . GERD (gastroesophageal reflux disease) 06/29/2014  . SYNCOPE 03/02/2010  . HYPERTENSION, BENIGN 03/01/2010   Past Medical History:  Diagnosis Date  . Asthma    childhood  . Carotidynia   . DDD (degenerative disc disease), cervical   . Hypertension   . Knee pain    left meniscus tear s/p repair 11/2014  . Seizures ([place])   . Stuttering    patient reports this started after her knee surgery in 11/2014.   Marland Kitchen Thyroid nodule     Family History  Problem Relation Age of Onset  . Hypertension Mother   . Lung cancer Father   . Hypertension Brother   . Colon cancer Neg Hx   . Gastric cancer Neg Hx   . Esophageal cancer Neg Hx     Past Surgical History:  Procedure Laterality Date  . ANTERIOR CERVICAL DECOMP/DISCECTOMY FUSION N/A 03/20/2019   Procedure: Anterior Cervical Decompression Fusion - Cervical Four-Cervical Five - Cervical Five-Cervical Six;  Surgeon: Eustace Moore, MD;  Location: [facility];  Service: Neurosurgery;  Laterality: N/A;  Anterior Cervical Decompression Fusion - Cervical Four-Cervical Five - Cervical Five-Cervical Six  . COLONOSCOPY N/A 08/23/2016   Procedure: COLONOSCOPY;  Surgeon: Daneil Dolin, MD;  [facility];  Service: Endoscopy;  Laterality: N/A;  1:00pm  . ESOPHAGOGASTRODUODENOSCOPY N/A 05/03/2016    Procedure: ESOPHAGOGASTRODUODENOSCOPY (EGD);  Surgeon: Daneil Dolin, MD;  [facility];  Service: Endoscopy;  Laterality: N/A;  . ESOPHAGOGASTRODUODENOSCOPY N/A 08/23/2016   Procedure: ESOPHAGOGASTRODUODENOSCOPY (EGD);  Surgeon: Daneil Dolin, MD;  [facility];  Service: Endoscopy;  Laterality: N/A;  . KNEE ARTHROSCOPY WITH MEDIAL MENISECTOMY Left 12/09/2014   Procedure: KNEE ARTHROSCOPY WITH PARTIAL MEDIAL AND LATERAL MENISECTOMY;  Surgeon: Sanjuana Kava, MD;  [facility];  Service: Orthopedics;  Laterality: Left;  . TUBAL LIGATION    . WISDOM TOOTH EXTRACTION     Social History   Occupational History  . Occupation: Umemployed    Comment:  Disability  Tobacco Use  . Smoking status: Never Smoker  . Smokeless tobacco: Never Used  Vaping Use  . Vaping Use: Never used  Substance and Sexual Activity  . Alcohol use: No  . Drug use: No  . Sexual activity: Not Currently    Birth control/protection: Surgical

## 2020-03-22 NOTE — Telephone Encounter (Signed)
No Show #1. Called patient about missed appointment but patient did not answer and voicemail box not set up. Could not remind patient of upcoming appointment.   11:20 AM, 03/22/20 Brittany Durham, DPT Physical Therapy with [facility]  [phone] office

## 2020-03-23 ENCOUNTER — Other Ambulatory Visit: Payer: Self-pay | Admitting: Family

## 2020-03-23 DIAGNOSIS — K279 Peptic ulcer, site unspecified, unspecified as acute or chronic, without hemorrhage or perforation: Secondary | ICD-10-CM

## 2020-03-24 ENCOUNTER — Other Ambulatory Visit: Payer: Self-pay

## 2020-03-24 ENCOUNTER — Ambulatory Visit (HOSPITAL_COMMUNITY): Payer: Medicare Other

## 2020-03-24 ENCOUNTER — Encounter (HOSPITAL_COMMUNITY): Payer: Self-pay

## 2020-03-24 DIAGNOSIS — R42 Dizziness and giddiness: Secondary | ICD-10-CM

## 2020-03-24 DIAGNOSIS — M542 Cervicalgia: Secondary | ICD-10-CM | POA: Diagnosis not present

## 2020-03-24 DIAGNOSIS — R293 Abnormal posture: Secondary | ICD-10-CM | POA: Diagnosis not present

## 2020-03-24 NOTE — Therapy (Signed)
[facility] [address] [place], [place], [address] Phone: [phone]   Fax:  [phone]  Physical Therapy Treatment  Patient Details  Name: Brittany Durham MRN: [id number] Date of Birth: 1970-12-15 Referring Provider (PT): Jac Canavan NP   Encounter Date: 03/24/2020   PT End of Session - 03/24/20 1108    Visit Number 2    Number of Visits 8    Authorization Type Medicare A/ Medicaid secondary    PT Start Time 1050    PT Stop Time 1128    PT Time Calculation (min) 38 min    Activity Tolerance Patient tolerated treatment well    Behavior During Therapy [facility] for tasks assessed/performed           Past Medical History:  Diagnosis Date  . Asthma    childhood  . Carotidynia   . DDD (degenerative disc disease), cervical   . Hypertension   . Knee pain    left meniscus tear s/p repair 11/2014  . Seizures ([place])   . Stuttering    patient reports this started after her knee surgery in 11/2014.   Marland Kitchen Thyroid nodule     Past Surgical History:  Procedure Laterality Date  . ANTERIOR CERVICAL DECOMP/DISCECTOMY FUSION N/A 03/20/2019   Procedure: Anterior Cervical Decompression Fusion - Cervical Four-Cervical Five - Cervical Five-Cervical Six;  Surgeon: Eustace Moore, MD;  Location: [facility];  Service: Neurosurgery;  Laterality: N/A;  Anterior Cervical Decompression Fusion - Cervical Four-Cervical Five - Cervical Five-Cervical Six  . COLONOSCOPY N/A 08/23/2016   Procedure: COLONOSCOPY;  Surgeon: Daneil Dolin, MD;  [facility];  Service: Endoscopy;  Laterality: N/A;  1:00pm  . ESOPHAGOGASTRODUODENOSCOPY N/A 05/03/2016   Procedure: ESOPHAGOGASTRODUODENOSCOPY (EGD);  Surgeon: Daneil Dolin, MD;  [facility];  Service: Endoscopy;  Laterality: N/A;  . ESOPHAGOGASTRODUODENOSCOPY N/A 08/23/2016   Procedure: ESOPHAGOGASTRODUODENOSCOPY (EGD);  Surgeon: Daneil Dolin, MD;  [facility];  Service: Endoscopy;   Laterality: N/A;  . KNEE ARTHROSCOPY WITH MEDIAL MENISECTOMY Left 12/09/2014   Procedure: KNEE ARTHROSCOPY WITH PARTIAL MEDIAL AND LATERAL MENISECTOMY;  Surgeon: Sanjuana Kava, MD;  [facility];  Service: Orthopedics;  Laterality: Left;  . TUBAL LIGATION    . WISDOM TOOTH EXTRACTION      There were no vitals filed for this visit.   Subjective Assessment - 03/24/20 1052    Subjective Pt reports no change in dizziness.  Reports the dizziness comes and goes, cannot predict when it will occur.  Reports 1 fall coming down stairs due to dizziness, feels her knee weakness that may have been partial to blame.  Stated she work at 2 this morning and unable to make it to restroom due to her dizziness.    Pertinent History ACDF 03/2019, Bilateral TKA, HTN, Asthma, some level of expressive/ speech difficulty    Patient Stated Goals I want dizziness to go away    Currently in Pain? No/denies                   Vestibular Assessment - 03/24/20 0001      Symptom Behavior   Type of Dizziness  Spinning    Duration of Dizziness 40" until resolved, no nystagmus    Aggravating Factors Supine to sit                    [facility] Adult PT Treatment/Exercise - 03/24/20 0001      Posture/Postural Control  Posture/Postural Control Postural limitations    Postural Limitations Rounded Shoulders;Forward head      Exercises   Exercises Neck      Neck Exercises: Seated   Other Seated Exercise 3D cervical excursion 5x each direction    Other Seated Exercise cervical and scapular retracton 5x       Manual Therapy   Manual Therapy Soft tissue mobilization    Manual therapy comments Manual complete separate than rest of tx    Soft tissue mobilization Supine with LE elevated for comfort:  STM to UT, levator and posterior cervical mm           Vestibular Treatment/Exercise - 03/24/20 0001      Vestibular Treatment/Exercise   Gaze Exercises Eye/Head Exercise Horizontal;X1 Viewing  Horizontal      Eye/Head Exercise Vertical   Comment saccades seated x10; seated VOR 1 x 10               Balance Exercises - 03/24/20 0001      Balance Exercises: Standing   Standing Eyes Opened Narrow base of support (BOS);Head turns;5 reps   head still eyes looking Rt and Lt, head rotation   Tandem Stance 2 reps;30 secs             PT Education - 03/24/20 1058    Education Details Reviewed goals, educated importance of HEP compliance, pt able to recall and demonstrate appropriate mechanics with mininal cuieng for eye gaze.  Educated importance of posture.  Importance of cervical mobility to assist with dizziness.            PT Short Term Goals - 03/15/20 1107      PT SHORT TERM GOAL #1   Title Patient will be independent with initial HEP and self-management strategies to improve functional outcomes    Time 2    Period Weeks    Status New    Target Date 04/01/20             PT Long Term Goals - 03/15/20 1108      PT LONG TERM GOAL #1   Title Patient will report at least 60% overall improvement in subjective complaint to indicate improvement in ability to perform ADLs.    Time 4    Period Weeks    Status New    Target Date 04/15/20      PT LONG TERM GOAL #2   Title Patient will report no new falls since starting therapy for improved functional outcomes and decreased risk of injury    Time 4    Period Weeks    Status New    Target Date 04/15/20      PT LONG TERM GOAL #3   Title Patient will be independent with final HEP and self-management strategies to improve functional outcomes    Time 4    Period Weeks    Status New    Target Date 04/15/20                 Plan - 03/24/20 1108    Clinical Impression Statement Reviewed goals, educated importance of HEP compliance, pt able to recall and demonstrate appropraite mechanics with minimal cueing for eye gaze.  Noted impaired cervical mobility, added mobility and postural strenghtening exercises  to address impairement.  Manual STM complete as well to address tightness.  Static balance activities complete with intermittent HHA required for LOB episodes.  Increased difficulty with NBOS head turns.  1 episode of dizziness this  session as transitions to sitting from supine, resolved following 40".  No nystagmus noted during gaze activities or following transition to sitting from supine.    Personal Factors and Comorbidities Comorbidity 2    Comorbidities HTN, asthma    Examination-Activity Limitations Stand;Locomotion Level    Examination-Participation Restrictions Art gallery manager;Yard Work    Stability/Clinical Decision Making Stable/Uncomplicated    Designer, jewellery Low    Rehab Potential Good    PT Frequency 2x / week    PT Duration 4 weeks    PT Treatment/Interventions ADLs/Self Care Home Management;Aquatic Therapy;Biofeedback;Cryotherapy;Electrical Stimulation;Iontophoresis 4mg /ml Dexamethasone;Moist Heat;Traction;Ultrasound;Parrafin;Therapeutic activities;Patient/family education;Fluidtherapy;Manual lymph drainage;Manual techniques;Neuromuscular re-education;Balance training;Therapeutic exercise;Contrast Bath;Compression bandaging;Scar mobilization;Taping;Joint Manipulations;Vasopneumatic Device;Vestibular;Visual/perceptual remediation/compensation;Passive range of motion;Dry needling;Energy conservation;Spinal Manipulations;Splinting;Orthotic Fit/Training;DME Instruction;Gait training;Stair training;Canalith Repostioning;Functional mobility training    PT Next Visit Plan Assess static balance. Progress balance activity from static to dynamic, and habituation exercise for dizziness as tolerated. Incorporate postural strengthening to address posture restrictions which may be contributing to limitations as well.    PT Home Exercise Plan 03/15/20: saccades, VOR 1           Patient will benefit from skilled therapeutic intervention in order to improve the following  deficits and impairments:  Decreased balance, Impaired perceived functional ability, Dizziness  Visit Diagnosis: Dizziness and giddiness  Abnormal posture     Problem List Patient Active Problem List   Diagnosis Date Noted  . Vertigo 01/26/2020  . Preventative health care 12/02/2019  . Diarrhea 09/28/2019  . GAD (generalized anxiety disorder) 07/28/2019  . S/P cervical spinal fusion 03/20/2019  . Chest pain 12/31/2018  . Osteoarthritis 11/18/2017  . Insomnia 11/18/2017  . 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 Tear of lateral meniscus of right knee, current 10/28/2017  . 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 Language difficulty 09/25/2016  . Nausea with vomiting 07/18/2016  . PUD (peptic ulcer disease) 07/18/2016  . Slurred speech   . GI bleed 05/02/2016  . Upper GI bleed 05/02/2016  . Sinus tachycardia 05/02/2016  . Acute blood loss anemia 05/02/2016  . Abdominal pain, epigastric 05/02/2016  . 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 Thyroid nodule 12/14/2014  . Left knee pain 12/14/2014  . Seizures ([place])   . Altered mental status 12/12/2014  . Vitamin D deficiency 06/30/2014  . GERD (gastroesophageal reflux disease) 06/29/2014  . SYNCOPE 03/02/2010  . HYPERTENSION, BENIGN 03/01/2010   Ihor Austin, LPTA/CLT; CBIS [phone]  Aldona Lento 03/24/2020, 1:11 PM  [facility] [place], [place], [address] Phone: [phone]   Fax:  [phone]  Name: Devyne Hauger MRN: [id number] Date of Birth: Dec 17, 1970

## 2020-03-29 ENCOUNTER — Ambulatory Visit (HOSPITAL_COMMUNITY): Payer: Medicare Other | Admitting: Physical Therapy

## 2020-03-31 ENCOUNTER — Other Ambulatory Visit: Payer: Self-pay

## 2020-03-31 ENCOUNTER — Ambulatory Visit (HOSPITAL_COMMUNITY): Payer: Medicare Other | Admitting: Physical Therapy

## 2020-03-31 ENCOUNTER — Encounter (HOSPITAL_COMMUNITY): Payer: Self-pay | Admitting: Physical Therapy

## 2020-03-31 DIAGNOSIS — R42 Dizziness and giddiness: Secondary | ICD-10-CM | POA: Diagnosis not present

## 2020-03-31 DIAGNOSIS — M542 Cervicalgia: Secondary | ICD-10-CM | POA: Diagnosis not present

## 2020-03-31 DIAGNOSIS — R293 Abnormal posture: Secondary | ICD-10-CM

## 2020-03-31 NOTE — Therapy (Addendum)
[facility] [address] [place], [place], [address] Phone: [phone]   Fax:  [phone]  Physical Therapy Treatment  Patient Details  Name: Brittany Durham MRN: [id number] Date of Birth: [birth year] Referring Provider (PT): Jac Canavan NP   Encounter Date: 03/31/2020   PT End of Session - 03/31/20 0915    Visit Number 3    Number of Visits 8    Authorization Type Medicare A/ Medicaid secondary    PT Start Time 0916    PT Stop Time 0955    PT Time Calculation (min) 39 min    Activity Tolerance Patient tolerated treatment well    Behavior During Therapy [facility] for tasks assessed/performed           Past Medical History:  Diagnosis Date  . Asthma    childhood  . Carotidynia   . DDD (degenerative disc disease), cervical   . Hypertension   . Knee pain    left meniscus tear s/p repair 11/2014  . Seizures ([place])   . Stuttering    patient reports this started after her knee surgery in 11/2014.   Marland Kitchen Thyroid nodule     Past Surgical History:  Procedure Laterality Date  . ANTERIOR CERVICAL DECOMP/DISCECTOMY FUSION N/A 03/20/2019   Procedure: Anterior Cervical Decompression Fusion - Cervical Four-Cervical Five - Cervical Five-Cervical Six;  Surgeon: Eustace Moore, MD;  Location: [facility];  Service: Neurosurgery;  Laterality: N/A;  Anterior Cervical Decompression Fusion - Cervical Four-Cervical Five - Cervical Five-Cervical Six  . COLONOSCOPY N/A 08/23/2016   Procedure: COLONOSCOPY;  Surgeon: Daneil Dolin, MD;  [facility];  Service: Endoscopy;  Laterality: N/A;  1:00pm  . ESOPHAGOGASTRODUODENOSCOPY N/A 05/03/2016   Procedure: ESOPHAGOGASTRODUODENOSCOPY (EGD);  Surgeon: Daneil Dolin, MD;  [facility];  Service: Endoscopy;  Laterality: N/A;  . ESOPHAGOGASTRODUODENOSCOPY N/A 08/23/2016   Procedure: ESOPHAGOGASTRODUODENOSCOPY (EGD);  Surgeon: Daneil Dolin, MD;  [facility];  Service: Endoscopy;   Laterality: N/A;  . KNEE ARTHROSCOPY WITH MEDIAL MENISECTOMY Left 12/09/2014   Procedure: KNEE ARTHROSCOPY WITH PARTIAL MEDIAL AND LATERAL MENISECTOMY;  Surgeon: Sanjuana Kava, MD;  [facility];  Service: Orthopedics;  Laterality: Left;  . TUBAL LIGATION    . WISDOM TOOTH EXTRACTION      There were no vitals filed for this visit.   Subjective Assessment - 03/31/20 0922    Subjective States she is sore in her shoulder from her fall states she fell this past Monday at her moms and hit her shoulder. States she was dizzy and the room started spinning and then her legs give away. States her shoulder is 4/10 right now. States her bone feels sore. States her dizziness is about the same.    Pertinent History ACDF 03/2019, Bilateral TKA, HTN, Asthma, some level of expressive/ speech difficulty    Patient Stated Goals I want dizziness to go away    Currently in Pain? Yes    Pain Score 4     Pain Location Shoulder    Pain Orientation Right    Pain Descriptors / Indicators Sore              [facility] PT Assessment - 03/31/20 0001      Assessment   Medical Diagnosis Dizziness     Referring Provider (PT) Jac Canavan NP               Vestibular Assessment - 03/31/20 0001  Positional Testing   Dix-Hallpike Dix-Hallpike Right      Dix-Hallpike Right   Dix-Hallpike Right Duration 2 minutes in each position     Dix-Hallpike Right Symptoms --   could not assess eyes - closed - intense symptoms                           PT Education - 03/31/20 0941    Education Details on BPPV, anatomy, how epley manuver works. on anticipated symptoms.    Person(s) Educated Patient    Methods Explanation    Comprehension Verbalized understanding            PT Short Term Goals - 03/15/20 1107      PT SHORT TERM GOAL #1   Title Patient will be independent with initial HEP and self-management strategies to improve functional outcomes    Time 2    Period Weeks    Status  New    Target Date 04/01/20             PT Long Term Goals - 03/15/20 1108      PT LONG TERM GOAL #1   Title Patient will report at least 60% overall improvement in subjective complaint to indicate improvement in ability to perform ADLs.    Time 4    Period Weeks    Status New    Target Date 04/15/20      PT LONG TERM GOAL #2   Title Patient will report no new falls since starting therapy for improved functional outcomes and decreased risk of injury    Time 4    Period Weeks    Status New    Target Date 04/15/20      PT LONG TERM GOAL #3   Title Patient will be independent with final HEP and self-management strategies to improve functional outcomes    Time 4    Period Weeks    Status New    Target Date 04/15/20                 Plan - 03/31/20 0916    Clinical Impression Statement Patient educated in Epley maneuver, BPPV and how crystals can cause symptoms. Trialed Epley maneuver to right and first attempt patient became so symptomatic she sprang back up on the table and jumped off the table and fell on the floor. No injury noted. Waited for symptoms to subside and then re-educated patient on maneuver. Trialed again and patient, with encouragement and tactile support able to tolerate maneuver with symptoms returning to baseline afterwards. Ended session early secondary to intense symptoms with maneuver. Will follow up with maneuver next session.    Personal Factors and Comorbidities Comorbidity 2    Comorbidities HTN, asthma    Examination-Activity Limitations Stand;Locomotion Level    Examination-Participation Restrictions Art gallery manager;Yard Work    Stability/Clinical Decision Making Stable/Uncomplicated    Rehab Potential Good    PT Frequency 2x / week    PT Duration 4 weeks    PT Treatment/Interventions ADLs/Self Care Home Management;Aquatic Therapy;Biofeedback;Cryotherapy;Electrical Stimulation;Iontophoresis 4mg /ml Dexamethasone;Moist  Heat;Traction;Ultrasound;Parrafin;Therapeutic activities;Patient/family education;Fluidtherapy;Manual lymph drainage;Manual techniques;Neuromuscular re-education;Balance training;Therapeutic exercise;Contrast Bath;Compression bandaging;Scar mobilization;Taping;Joint Manipulations;Vasopneumatic Device;Vestibular;Visual/perceptual remediation/compensation;Passive range of motion;Dry needling;Energy conservation;Spinal Manipulations;Splinting;Orthotic Fit/Training;DME Instruction;Gait training;Stair training;Canalith Repostioning;Functional mobility training    PT Next Visit Plan epley R - assess, trial other side.    PT Home Exercise Plan 03/15/20: saccades, VOR 1           Patient will benefit from  skilled therapeutic intervention in order to improve the following deficits and impairments:  Decreased balance, Impaired perceived functional ability, Dizziness  Visit Diagnosis: Dizziness and giddiness  Abnormal posture  Cervicalgia     Problem List Patient Active Problem List   Diagnosis Date Noted  . Vertigo 01/26/2020  . Preventative health care 12/02/2019  . Diarrhea 09/28/2019  . GAD (generalized anxiety disorder) 07/28/2019  . S/P cervical spinal fusion 03/20/2019  . Chest pain 12/31/2018  . Osteoarthritis 11/18/2017  . Insomnia 11/18/2017  . 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 Tear of lateral meniscus of right knee, current 10/28/2017  . 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 Language difficulty 09/25/2016  . Nausea with vomiting 07/18/2016  . PUD (peptic ulcer disease) 07/18/2016  . Slurred speech   . GI bleed 05/02/2016  . Upper GI bleed 05/02/2016  . Sinus tachycardia 05/02/2016  . Acute blood loss anemia 05/02/2016  . Abdominal pain, epigastric 05/02/2016  . 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 Thyroid nodule 12/14/2014  . Left knee pain 12/14/2014  . Seizures ([place])   . Altered mental status 12/12/2014  . Vitamin D deficiency 06/30/2014  . GERD  (gastroesophageal reflux disease) 06/29/2014  . SYNCOPE 03/02/2010  . HYPERTENSION, BENIGN 03/01/2010    12:16 PM, 03/31/20 Jerene Pitch, DPT Physical Therapy with [facility]  [phone] office   [facility] [address] [place], [place], [address] Phone: [phone]   Fax:  [phone]  Name: Brittany Durham MRN: [id number] Date of Birth: [birth year]

## 2020-04-01 ENCOUNTER — Other Ambulatory Visit: Payer: Self-pay | Admitting: Family

## 2020-04-01 DIAGNOSIS — I10 Essential (primary) hypertension: Secondary | ICD-10-CM

## 2020-04-04 ENCOUNTER — Encounter (HOSPITAL_COMMUNITY): Payer: Self-pay | Admitting: Physical Therapy

## 2020-04-04 ENCOUNTER — Other Ambulatory Visit: Payer: Self-pay

## 2020-04-04 ENCOUNTER — Ambulatory Visit (HOSPITAL_COMMUNITY): Payer: Medicare Other | Admitting: Physical Therapy

## 2020-04-04 DIAGNOSIS — R293 Abnormal posture: Secondary | ICD-10-CM | POA: Diagnosis not present

## 2020-04-04 DIAGNOSIS — M542 Cervicalgia: Secondary | ICD-10-CM | POA: Diagnosis not present

## 2020-04-04 DIAGNOSIS — R42 Dizziness and giddiness: Secondary | ICD-10-CM

## 2020-04-04 NOTE — Therapy (Signed)
[facility] [address] [place], [place], [address] Phone: [phone]   Fax:  [phone]  Physical Therapy Treatment  Patient Details  Name: Brittany Durham MRN: [id number] Date of Birth: 1971/01/29 Referring Provider (PT): Jac Canavan NP   Encounter Date: 04/04/2020   PT End of Session - 04/04/20 0956    Visit Number 4    Number of Visits 8    Authorization Type Medicare A/ Medicaid secondary    PT Start Time 1000    PT Stop Time 1035    PT Time Calculation (min) 35 min    Activity Tolerance Patient tolerated treatment well    Behavior During Therapy [facility] for tasks assessed/performed           Past Medical History:  Diagnosis Date  . Asthma    childhood  . Carotidynia   . DDD (degenerative disc disease), cervical   . Hypertension   . Knee pain    left meniscus tear s/p repair 11/2014  . Seizures ([place])   . Stuttering    patient reports this started after her knee surgery in 11/2014.   Marland Kitchen Thyroid nodule     Past Surgical History:  Procedure Laterality Date  . ANTERIOR CERVICAL DECOMP/DISCECTOMY FUSION N/A 03/20/2019   Procedure: Anterior Cervical Decompression Fusion - Cervical Four-Cervical Five - Cervical Five-Cervical Six;  Surgeon: Eustace Moore, MD;  Location: [facility];  Service: Neurosurgery;  Laterality: N/A;  Anterior Cervical Decompression Fusion - Cervical Four-Cervical Five - Cervical Five-Cervical Six  . COLONOSCOPY N/A 08/23/2016   Procedure: COLONOSCOPY;  Surgeon: Daneil Dolin, MD;  [facility];  Service: Endoscopy;  Laterality: N/A;  1:00pm  . ESOPHAGOGASTRODUODENOSCOPY N/A 05/03/2016   Procedure: ESOPHAGOGASTRODUODENOSCOPY (EGD);  Surgeon: Daneil Dolin, MD;  [facility];  Service: Endoscopy;  Laterality: N/A;  . ESOPHAGOGASTRODUODENOSCOPY N/A 08/23/2016   Procedure: ESOPHAGOGASTRODUODENOSCOPY (EGD);  Surgeon: Daneil Dolin, MD;  [facility];  Service: Endoscopy;   Laterality: N/A;  . KNEE ARTHROSCOPY WITH MEDIAL MENISECTOMY Left 12/09/2014   Procedure: KNEE ARTHROSCOPY WITH PARTIAL MEDIAL AND LATERAL MENISECTOMY;  Surgeon: Sanjuana Kava, MD;  [facility];  Service: Orthopedics;  Laterality: Left;  . TUBAL LIGATION    . WISDOM TOOTH EXTRACTION      There were no vitals filed for this visit.   Subjective Assessment - 04/04/20 1005    Subjective States was tired the whole weekend, didn't feel well and didn't go to church. States she fell Saturday on her Kitchen floor. States she was feeling really dizzy so she took her pill and had to lay down. States that she felt the same after last session.    Pertinent History ACDF 03/2019, Bilateral TKA, HTN, Asthma, some level of expressive/ speech difficulty    Patient Stated Goals I want dizziness to go away              [facility] PT Assessment - 04/04/20 0001      Assessment   Medical Diagnosis Dizziness     Referring Provider (PT) Jac Canavan NP               Vestibular Assessment - 04/04/20 0001      Dix-Hallpike Right   Dix-Hallpike Right Duration up to 2 minutes in each position , 4x    Dix-Hallpike Right Symptoms Right nystagmus  PT Education - 04/04/20 1014    Education Details on rationale for exercise, safety and importance of manuver    Person(s) Educated Patient    Methods Explanation    Comprehension Verbalized understanding            PT Short Term Goals - 03/15/20 1107      PT SHORT TERM GOAL #1   Title Patient will be independent with initial HEP and self-management strategies to improve functional outcomes    Time 2    Period Weeks    Status New    Target Date 04/01/20             PT Long Term Goals - 03/15/20 1108      PT LONG TERM GOAL #1   Title Patient will report at least 60% overall improvement in subjective complaint to indicate improvement in ability to perform ADLs.    Time 4    Period Weeks     Status New    Target Date 04/15/20      PT LONG TERM GOAL #2   Title Patient will report no new falls since starting therapy for improved functional outcomes and decreased risk of injury    Time 4    Period Weeks    Status New    Target Date 04/15/20      PT LONG TERM GOAL #3   Title Patient will be independent with final HEP and self-management strategies to improve functional outcomes    Time 4    Period Weeks    Status New    Target Date 04/15/20                 Plan - 04/04/20 1033    Clinical Impression Statement Performed Epley maneuver multiple times today. Focused on right side. Initial Epley with right beating nystagmus, no nystagmus noted with subsequent maneuvers but patient reported double vision on the second and third attempts. The fourth attempt no double vision or symptoms were noted. Improved overall symptoms noted end of session (no symptoms reported end of session.) Will continue with Epley maneuver next session.    Personal Factors and Comorbidities Comorbidity 2    Comorbidities HTN, asthma    Examination-Activity Limitations Stand;Locomotion Level    Examination-Participation Restrictions Art gallery manager;Yard Work    Stability/Clinical Decision Making Stable/Uncomplicated    Rehab Potential Good    PT Frequency 2x / week    PT Duration 4 weeks    PT Treatment/Interventions ADLs/Self Care Home Management;Aquatic Therapy;Biofeedback;Cryotherapy;Electrical Stimulation;Iontophoresis 4mg /ml Dexamethasone;Moist Heat;Traction;Ultrasound;Parrafin;Therapeutic activities;Patient/family education;Fluidtherapy;Manual lymph drainage;Manual techniques;Neuromuscular re-education;Balance training;Therapeutic exercise;Contrast Bath;Compression bandaging;Scar mobilization;Taping;Joint Manipulations;Vasopneumatic Device;Vestibular;Visual/perceptual remediation/compensation;Passive range of motion;Dry needling;Energy conservation;Spinal  Manipulations;Splinting;Orthotic Fit/Training;DME Instruction;Gait training;Stair training;Canalith Repostioning;Functional mobility training    PT Next Visit Plan epley R - assess, trial other side.    PT Home Exercise Plan 03/15/20: saccades, VOR 1           Patient will benefit from skilled therapeutic intervention in order to improve the following deficits and impairments:  Decreased balance, Impaired perceived functional ability, Dizziness  Visit Diagnosis: Dizziness and giddiness  Abnormal posture     Problem List Patient Active Problem List   Diagnosis Date Noted  . Vertigo 01/26/2020  . Preventative health care 12/02/2019  . Diarrhea 09/28/2019  . GAD (generalized anxiety disorder) 07/28/2019  . S/P cervical spinal fusion 03/20/2019  . Chest pain 12/31/2018  . Osteoarthritis 11/18/2017  . Insomnia 11/18/2017  . 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 Tear of lateral  meniscus of right knee, current 10/28/2017  . 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 Language difficulty 09/25/2016  . Nausea with vomiting 07/18/2016  . PUD (peptic ulcer disease) 07/18/2016  . Slurred speech   . GI bleed 05/02/2016  . Upper GI bleed 05/02/2016  . Sinus tachycardia 05/02/2016  . Acute blood loss anemia 05/02/2016  . Abdominal pain, epigastric 05/02/2016  . 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 Thyroid nodule 12/14/2014  . Left knee pain 12/14/2014  . Seizures ([place])   . Altered mental status 12/12/2014  . Vitamin D deficiency 06/30/2014  . GERD (gastroesophageal reflux disease) 06/29/2014  . SYNCOPE 03/02/2010  . HYPERTENSION, BENIGN 03/01/2010   10:37 AM, 04/04/20 Jerene Pitch, DPT Physical Therapy with [facility]  [phone] office  [facility] [address] [place], [place], [address] Phone: [phone]   Fax:  [phone]  Name: Shabana Armentrout MRN: [id number] Date of Birth:  04-03-1971

## 2020-04-06 ENCOUNTER — Ambulatory Visit (HOSPITAL_COMMUNITY): Payer: Medicare Other | Admitting: Physical Therapy

## 2020-04-06 ENCOUNTER — Other Ambulatory Visit: Payer: Self-pay

## 2020-04-06 ENCOUNTER — Encounter (HOSPITAL_COMMUNITY): Payer: Self-pay | Admitting: Physical Therapy

## 2020-04-06 DIAGNOSIS — R42 Dizziness and giddiness: Secondary | ICD-10-CM | POA: Diagnosis not present

## 2020-04-06 DIAGNOSIS — R293 Abnormal posture: Secondary | ICD-10-CM

## 2020-04-06 DIAGNOSIS — M542 Cervicalgia: Secondary | ICD-10-CM | POA: Diagnosis not present

## 2020-04-06 NOTE — Therapy (Addendum)
[facility] [address] [place], [place], [address] Phone: [phone]   Fax:  [phone]  Physical Therapy Treatment and Discharge Note  Patient Details  Name: Brittany Durham MRN: [id number] Date of Birth: 08-13-1970 Referring Provider (PT): Jac Canavan NP   PHYSICAL THERAPY DISCHARGE SUMMARY  Visits from Start of Care: 5  Current functional level related to goals / functional outcomes: Unable to assess due to unplanned discharge   Remaining deficits: Unable to assess due to unplanned discharge   Education / Equipment: Unable to assess due to unplanned discharge Plan: Patient agrees to discharge.  Patient goals were not met. Patient is being discharged due to not returning since the last visit.  ?????    3:07 PM, 06/16/20 Jerene Pitch, DPT Physical Therapy with [facility]  [phone] office    Encounter Date: 04/06/2020   PT End of Session - 04/06/20 0912    Visit Number 5    Number of Visits 8    Authorization Type Medicare A/ Medicaid secondary    PT Start Time 0914    PT Stop Time 0950    PT Time Calculation (min) 36 min    Activity Tolerance Patient tolerated treatment well    Behavior During Therapy [facility] for tasks assessed/performed           Past Medical History:  Diagnosis Date  . Asthma    childhood  . Carotidynia   . DDD (degenerative disc disease), cervical   . Hypertension   . Knee pain    left meniscus tear s/p repair 11/2014  . Seizures ([place])   . Stuttering    patient reports this started after her knee surgery in 11/2014.   Marland Kitchen Thyroid nodule     Past Surgical History:  Procedure Laterality Date  . ANTERIOR CERVICAL DECOMP/DISCECTOMY FUSION N/A 03/20/2019   Procedure: Anterior Cervical Decompression Fusion - Cervical Four-Cervical Five - Cervical Five-Cervical Six;  Surgeon: Eustace Moore, MD;  Location: [facility];  Service: Neurosurgery;  Laterality: N/A;  Anterior  Cervical Decompression Fusion - Cervical Four-Cervical Five - Cervical Five-Cervical Six  . COLONOSCOPY N/A 08/23/2016   Procedure: COLONOSCOPY;  Surgeon: Daneil Dolin, MD;  [facility];  Service: Endoscopy;  Laterality: N/A;  1:00pm  . ESOPHAGOGASTRODUODENOSCOPY N/A 05/03/2016   Procedure: ESOPHAGOGASTRODUODENOSCOPY (EGD);  Surgeon: Daneil Dolin, MD;  [facility];  Service: Endoscopy;  Laterality: N/A;  . ESOPHAGOGASTRODUODENOSCOPY N/A 08/23/2016   Procedure: ESOPHAGOGASTRODUODENOSCOPY (EGD);  Surgeon: Daneil Dolin, MD;  [facility];  Service: Endoscopy;  Laterality: N/A;  . KNEE ARTHROSCOPY WITH MEDIAL MENISECTOMY Left 12/09/2014   Procedure: KNEE ARTHROSCOPY WITH PARTIAL MEDIAL AND LATERAL MENISECTOMY;  Surgeon: Sanjuana Kava, MD;  [facility];  Service: Orthopedics;  Laterality: Left;  . TUBAL LIGATION    . WISDOM TOOTH EXTRACTION      There were no vitals filed for this visit.   Subjective Assessment - 04/06/20 0917    Subjective Reports she is tired this morning. States she has not fallen since her last appointment. States she is not currently dizziness but is if she goes to get up from seated position.    Pertinent History ACDF 03/2019, Bilateral TKA, HTN, Asthma, some level of expressive/ speech difficulty    Patient Stated Goals I want dizziness to go away    Currently in Pain? Yes    Pain Score 3     Pain Descriptors / Indicators --  dizziness with standing             [facility] PT Assessment - 04/06/20 0001      Assessment   Medical Diagnosis Dizziness     Referring Provider (PT) Jac Canavan NP               Vestibular Assessment - 04/06/20 0001      Positional Testing   Dix-Hallpike Dix-Hallpike Right;Dix-Hallpike Left    Horizontal Canal Testing --      Dix-Hallpike Right   Dix-Hallpike Right Symptoms Upbeat, right rotatory nystagmus      Dix-Hallpike Left   Dix-Hallpike Left Symptoms No nystagmus;Other  (comment)   negative     Horizontal Canal Right   Horizontal Canal Right Symptoms --      Horizontal Canal Left   Horizontal Canal Left Symptoms --      Positional Sensitivities   Sit to Supine Mild dizziness   repeated  until symptoms resolved   Supine to Sitting Mild dizziness   repeated  until symptoms resolved   Right Hallpike --    Rolling Right Moderate dizziness   repeated  until symptoms resolved   Positional Sensitivities Comments lumbar flexion seated - x5, sit to stand x5, seated side bending and flexion x5 B;                             PT Education - 04/06/20 0938    Education Details Educated patient in habituation exercises and how they will help with current symptoms.    Person(s) Educated Patient    Methods Explanation    Comprehension Verbalized understanding            PT Short Term Goals - 03/15/20 1107      PT SHORT TERM GOAL #1   Title Patient will be independent with initial HEP and self-management strategies to improve functional outcomes    Time 2    Period Weeks    Status New    Target Date 04/01/20             PT Long Term Goals - 03/15/20 1108      PT LONG TERM GOAL #1   Title Patient will report at least 60% overall improvement in subjective complaint to indicate improvement in ability to perform ADLs.    Time 4    Period Weeks    Status New    Target Date 04/15/20      PT LONG TERM GOAL #2   Title Patient will report no new falls since starting therapy for improved functional outcomes and decreased risk of injury    Time 4    Period Weeks    Status New    Target Date 04/15/20      PT LONG TERM GOAL #3   Title Patient will be independent with final HEP and self-management strategies to improve functional outcomes    Time 4    Period Weeks    Status New    Target Date 04/15/20                 Plan - 04/06/20 0912    Clinical Impression Statement Performed Epley maneuver to right and nystagmus noted  in first position but in no other position. Symptoms resolved after maneuver. Tested left with no symptoms reported. Transitioned to seat and during transition patient placed purse on other chair (bending forward) and had increase in dizziness symptoms. Symptoms  resolved after 2 minutes. Transitioned to habituation exercises which were tolerated well. Symptoms reported initially but with repetition completely resolved by 5th set except with supine to long sitting and rolling right which took more repetitions. Symptoms completely resolved with supine to long sitting and improved with rolling right but patient reported feeling on needing to throw up after multiple attempts of rolling over but patient did not actually throw up. Symptoms returned to baseline by end of session.    Personal Factors and Comorbidities Comorbidity 2    Comorbidities HTN, asthma    Examination-Activity Limitations Stand;Locomotion Level    Examination-Participation Restrictions Art gallery manager;Yard Work    Stability/Clinical Decision Making Stable/Uncomplicated    Rehab Potential Good    PT Frequency 2x / week    PT Duration 4 weeks    PT Treatment/Interventions ADLs/Self Care Home Management;Aquatic Therapy;Biofeedback;Cryotherapy;Electrical Stimulation;Iontophoresis 56m/ml Dexamethasone;Moist Heat;Traction;Ultrasound;Parrafin;Therapeutic activities;Patient/family education;Fluidtherapy;Manual lymph drainage;Manual techniques;Neuromuscular re-education;Balance training;Therapeutic exercise;Contrast Bath;Compression bandaging;Scar mobilization;Taping;Joint Manipulations;Vasopneumatic Device;Vestibular;Visual/perceptual remediation/compensation;Passive range of motion;Dry needling;Energy conservation;Spinal Manipulations;Splinting;Orthotic Fit/Training;DME Instruction;Gait training;Stair training;Canalith Repostioning;Functional mobility training    PT Next Visit Plan may need epley again pending symptoms, habituation  exercises.    PT Home Exercise Plan 03/15/20: saccades, VOR 1           Patient will benefit from skilled therapeutic intervention in order to improve the following deficits and impairments:  Decreased balance, Impaired perceived functional ability, Dizziness  Visit Diagnosis: Dizziness and giddiness  Abnormal posture     Problem List Patient Active Problem List   Diagnosis Date Noted  . Vertigo 01/26/2020  . Preventative health care 12/02/2019  . Diarrhea 09/28/2019  . GAD (generalized anxiety disorder) 07/28/2019  . S/P cervical spinal fusion 03/20/2019  . Chest pain 12/31/2018  . Osteoarthritis 11/18/2017  . Insomnia 11/18/2017  . 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 Tear of lateral meniscus of right knee, current 10/28/2017  . 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 Language difficulty 09/25/2016  . Nausea with vomiting 07/18/2016  . PUD (peptic ulcer disease) 07/18/2016  . Slurred speech   . GI bleed 05/02/2016  . Upper GI bleed 05/02/2016  . Sinus tachycardia 05/02/2016  . Acute blood loss anemia 05/02/2016  . Abdominal pain, epigastric 05/02/2016  . 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 Thyroid nodule 12/14/2014  . Left knee pain 12/14/2014  . [place]   . Altered mental status 12/12/2014  . Vitamin D deficiency 06/30/2014  . GERD (gastroesophageal reflux disease) 06/29/2014  . SYNCOPE 03/02/2010  . HYPERTENSION, BENIGN 03/01/2010  9:52 AM, 04/06/20 MJerene Pitch DPT [facility] [phone]  [place] [address]: [phone]  Fax:  [phone] Name: APeyten PunchesMRN: [id number] of Birth: [birth year]

## 2020-04-12 ENCOUNTER — Telehealth (HOSPITAL_COMMUNITY): Payer: Self-pay | Admitting: Physical Therapy

## 2020-04-12 ENCOUNTER — Ambulatory Visit (HOSPITAL_COMMUNITY): Payer: Medicare Other | Admitting: Physical Therapy

## 2020-04-12 NOTE — Telephone Encounter (Signed)
pt's dtr called to cx today's appt due to mom has a cold.

## 2020-04-14 ENCOUNTER — Ambulatory Visit (HOSPITAL_COMMUNITY): Payer: Medicare Other | Admitting: Physical Therapy

## 2020-04-19 ENCOUNTER — Telehealth (HOSPITAL_COMMUNITY): Payer: Self-pay | Admitting: Physical Therapy

## 2020-04-19 ENCOUNTER — Ambulatory Visit (HOSPITAL_COMMUNITY): Payer: Medicare Other | Attending: Nurse Practitioner | Admitting: Physical Therapy

## 2020-04-19 NOTE — Telephone Encounter (Signed)
Called patient about missed appointment today. No answer and voicemail not set up, unable to leave voicemail. Will contact again at a later date.    6:01 PM, 04/19/20 Josue Hector PT DPT  Physical Therapist with [facility]  [phone]

## 2020-04-27 ENCOUNTER — Ambulatory Visit (INDEPENDENT_AMBULATORY_CARE_PROVIDER_SITE_OTHER): Payer: Medicare Other | Admitting: Cardiology

## 2020-04-27 ENCOUNTER — Other Ambulatory Visit: Payer: Self-pay

## 2020-04-27 ENCOUNTER — Encounter: Payer: Self-pay | Admitting: Cardiology

## 2020-04-27 VITALS — BP 130/84 | HR 98 | Ht 67.0 in | Wt 239.8 lb

## 2020-04-27 DIAGNOSIS — Z7189 Other specified counseling: Secondary | ICD-10-CM

## 2020-04-27 DIAGNOSIS — I10 Essential (primary) hypertension: Secondary | ICD-10-CM

## 2020-04-27 DIAGNOSIS — H8110 Benign paroxysmal vertigo, unspecified ear: Secondary | ICD-10-CM | POA: Diagnosis not present

## 2020-04-27 DIAGNOSIS — R072 Precordial pain: Secondary | ICD-10-CM | POA: Diagnosis not present

## 2020-04-27 NOTE — Progress Notes (Signed)
Cardiology Office Note:    Date:  04/27/2020   ID:  Brittany Durham, DOB Durham 02, 1972, MRN [id number]  PCP:  Brittany Balloon, FNP  Cardiologist:  Brittany Dresser, MD PhD  Referring MD: Brittany Balloon, FNP   CC: follow up  History of Present Illness:    Brittany Durham is a 49 y.o. female with a hx of hypertension, speech disorder who is seen for follow up. I initially saw her 01/29/19 as a new consult at the request of Brittany Balloon, FNP for the evaluation and management of chest pain.  Cardiac history:  [name] has an undefined speech issue at baseline. Has been seen in [place] ER 01/06/19 for chest pain. Had gone frequently in the past but had not been in some time. That event was the result of gradually increasing central, sharp chest pain, worse with deep breathing, that became so severe that she sought emergency medical attention. At that visit, HsTn 6-> 7, not consistent with ACS. D-dimer normal. CXR without acute changes. ECG was sinus tach. She improved with toradal, ativan, and fluids.  Chest pain history: -Initial onset: ~10 years ago -Quality: sharp, central, nonradiating, sometimes pleuritic -Frequency: had two years between event in August and prior event. -Associated symptoms: short of breath, nauseated, dizzy. Distant syncope but none recently (2-3 years ago) -Aggravating/alleviating factors: better when she sleeps, better with sucralfate but then comes back -Prior cardiac history: no MI. 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 Has never seen neurologist.  Today: Here with her daughter and grandson today.   Has been having issues with vertigo. Had an episode several months ago when she had a severe episode and passed out. She went to [facility] ER for this (01/15/20). Has had intermittently since, had today when she laid back for her ECG. We discussed BPPV and this seems to fit her symptoms. We  discussed options for management, and she will discuss with her PCP.  Having only rare chest pain and shortness of breath, improved from prior. Reviewed prior cardiac workup, which is unremarkable.  Denies PND, orthopnea, LE edema or unexpected weight gain. No syncope or palpitations.  Past Medical History:  Diagnosis Date   Asthma    childhood   Carotidynia    DDD (degenerative disc disease), cervical    Hypertension    Knee pain    left meniscus tear s/p repair 11/2014   Seizures ([place])    Stuttering    patient reports this started after her knee surgery in 11/2014.    Thyroid nodule     Past Surgical History:  Procedure Laterality Date   ANTERIOR CERVICAL DECOMP/DISCECTOMY FUSION N/A 03/20/2019   Procedure: Anterior Cervical Decompression Fusion - Cervical Four-Cervical Five - Cervical Five-Cervical Six;  Surgeon: Eustace Moore, MD;  Location: [facility];  Service: Neurosurgery;  Laterality: N/A;  Anterior Cervical Decompression Fusion - Cervical Four-Cervical Five - Cervical Five-Cervical Six   COLONOSCOPY N/A 08/23/2016   Procedure: COLONOSCOPY;  Surgeon: Daneil Dolin, MD;  [facility];  Service: Endoscopy;  Laterality: N/A;  1:00pm   ESOPHAGOGASTRODUODENOSCOPY N/A 05/03/2016   Procedure: ESOPHAGOGASTRODUODENOSCOPY (EGD);  Surgeon: Daneil Dolin, MD;  [facility];  Service: Endoscopy;  Laterality: N/A;   ESOPHAGOGASTRODUODENOSCOPY N/A 08/23/2016   Procedure: ESOPHAGOGASTRODUODENOSCOPY (EGD);  Surgeon: Daneil Dolin, MD;  [facility];  Service: Endoscopy;  Laterality: N/A;   KNEE ARTHROSCOPY WITH MEDIAL  MENISECTOMY Left 12/09/2014   Procedure: KNEE ARTHROSCOPY WITH PARTIAL MEDIAL AND LATERAL MENISECTOMY;  Surgeon: Sanjuana Kava, MD;  [facility];  Service: Orthopedics;  Laterality: Left;   TUBAL LIGATION     WISDOM TOOTH EXTRACTION      Current Medications: Current Outpatient Medications on File Prior to Visit  Medication Sig   amLODipine  (NORVASC) 10 MG tablet TAKE 1 TABLET ONCE DAILY   baclofen (LIORESAL) 10 MG tablet TAKE  (1)  TABLET  THREE TIMES DAILY.   diclofenac (VOLTAREN) 75 MG EC tablet Take 75 mg by mouth daily.   escitalopram (LEXAPRO) 10 MG tablet Take 1 tablet (10 mg total) by mouth daily.   lisinopril (ZESTRIL) 20 MG tablet TAKE 1 TABLET DAILY   meclizine (ANTIVERT) 25 MG tablet Take 1 tablet (25 mg total) by mouth 3 (three) times daily as needed for dizziness.   pantoprazole (PROTONIX) 40 MG tablet TAKE 1 TABLET ONCE DAILY BEFORE BREAKFAST   Vitamin D, Ergocalciferol, (DRISDOL) 1.25 MG (50000 UNIT) CAPS capsule Take 50,000 Units by mouth once a week.   No current facility-administered medications on file prior to visit.     Allergies:   Patient has no known allergies.   Social History   Tobacco Use   Smoking status: Never Smoker   Smokeless tobacco: Never Used  Vaping Use   Vaping Use: Never used  Substance Use Topics   Alcohol use: No   Drug use: No    Family History: The patient's family history includes Hypertension in her brother and mother; Lung cancer in her father. There is no history of Colon cancer, Gastric cancer, or Esophageal cancer.  ROS:   Please see the history of present illness.  Additional pertinent ROS otherwise unremarkable.  EKGs/Labs/Other Studies Reviewed:    The following studies were reviewed today: Lexiscan 03/19/19 Nuclear stress EF: 51%. Visually, the EF appears to be greater than the computer calculated 51% and appears normal There was no ST segment deviation noted during stress. This is a low risk study. There is no evidence of ischemia or previous infarction The study is normal  Echo 2018, 2016 personally reviewed  EKG:  EKG is personally reviewed.  The ekg ordered today demonstrates NSR at 98 bpm  Recent Labs: 01/15/2020: ALT 23; BUN 14; Creatinine, Ser 0.64; Hemoglobin 9.2; Platelets 231; Potassium 3.7; Sodium 138  Recent Lipid Panel    Component Value  Date/Time   CHOL 196 01/29/2019 1039   TRIG 93 01/29/2019 1039   HDL 58 01/29/2019 1039   CHOLHDL 3.4 01/29/2019 1039   CHOLHDL 2.9 12/13/2014 0613   VLDL 10 12/13/2014 0613   LDLCALC 121 (H) 01/29/2019 1039    Physical Exam:    VS:  BP 130/84   Pulse 98   Ht 5\' 7"  (1.702 m)   Wt 239 lb 12.8 oz (108.8 kg)   BMI 37.56 kg/m     Wt Readings from Last 3 Encounters:  03/22/20 220 lb (99.8 kg)  01/26/20 230 lb 9.6 oz (104.6 kg)  01/15/20 224 lb (101.6 kg)    GEN: Well nourished, well developed in no acute distress HEENT: Normal, moist mucous membranes NECK: No JVD CARDIAC: regular rhythm, normal S1 and S2, no rubs or gallops. No murmur. VASCULAR: Radial and DP pulses 2+ bilaterally. No carotid bruits RESPIRATORY:  Clear to auscultation without rales, wheezing or rhonchi  ABDOMEN: Soft, non-tender, non-distended MUSCULOSKELETAL:  Ambulates independently SKIN: Warm and dry, no edema NEUROLOGIC:  Alert and oriented x  3. No focal neuro deficits noted beyond chronic speech changes. PSYCHIATRIC:  Normal affect   ASSESSMENT:    1. Essential hypertension   2. Benign paroxysmal positional vertigo, unspecified laterality   3. Precordial pain   4. Cardiac risk counseling   5. Counseling on health promotion and disease prevention    PLAN:    Precordial pain:  -stress test reassuring. We reviewed her prior workup again today. -many other etiologies, including MSK, neurologic, pleuritic, GI, etc. Have improved with toradol, carafate, and nitroglycerin. Instructed on red flag signs that need immediate medical attention.   Hypertension; near goal today.  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 Limit salt. -recommend moderate walking, 3-5  times/week for 30-50 minutes each session. Aim for at least 150 minutes.week. Goal should be pace of 3 miles/hours, or walking 1.5 miles in 30 minutes -recommend avoidance of tobacco products. Avoid excess alcohol. -ASCVD risk score: The 10-year ASCVD risk score Mikey Bussing DC Brooke Bonito., et al., 2013) is: 3.8%   Values used to calculate the score:     Age: 45 years     Sex: Female     Is Non-Hispanic African American: Yes     Diabetic: No     Tobacco smoker: No     Systolic Blood Pressure: 683 mmHg     Is BP treated: Yes     HDL Cholesterol: 58 mg/dL     Total Cholesterol: 196 mg/dL    Plan for follow up: 12 months or sooner PRN  Medication Adjustments/Labs and Tests Ordered: Current medicines are reviewed at length with the patient today.  Concerns regarding medicines are outlined above.  Orders Placed This Encounter  Procedures   EKG 12-Lead   No orders of the defined types were placed in this encounter.   Patient Instructions  Medication Instructions:  Your Physician recommend you continue on your current medication as directed.    *If you need a refill on your cardiac medications before your next appointment, please call your pharmacy*   Lab Work: None   Testing/Procedures: None   Follow-Up: At [facility], you and your health needs are our priority.  As part of our continuing mission to provide you with exceptional heart care, we have created designated Provider Care Teams.  These Care Teams include your primary Cardiologist (physician) and Advanced Practice Providers (APPs -  Physician Assistants and Nurse Practitioners) who all work together to provide you with the care you need, when you need it.  We recommend signing up for the patient portal called "MyChart".  Sign up information is provided on this After Visit Summary.  MyChart is used to connect with patients for Virtual Visits (Telemedicine).  Patients are able to view lab/test results, encounter notes, upcoming  appointments, etc.  Non-urgent messages can be sent to your provider as well.   To learn more about what you can do with MyChart, go to [email or web address].    Your next appointment:   1 year(s)  The format for your next appointment:   In Person  Provider:   Buford Dresser, MD   You have what sounds like BPPV (benign paroxysmal positional vertigo). You can ask if either your primary care or ENT has someone that does the maneuvers to dislodge the crystals.  Signed, Brittany Dresser, MD PhD 04/27/2020  [facility]

## 2020-04-27 NOTE — Patient Instructions (Addendum)
Medication Instructions:  Your Physician recommend you continue on your current medication as directed.    *If you need a refill on your cardiac medications before your next appointment, please call your pharmacy*   Lab Work: None   Testing/Procedures: None   Follow-Up: At [facility], you and your health needs are our priority.  As part of our continuing mission to provide you with exceptional heart care, we have created designated Provider Care Teams.  These Care Teams include your primary Cardiologist (physician) and Advanced Practice Providers (APPs -  Physician Assistants and Nurse Practitioners) who all work together to provide you with the care you need, when you need it.  We recommend signing up for the patient portal called "MyChart".  Sign up information is provided on this After Visit Summary.  MyChart is used to connect with patients for Virtual Visits (Telemedicine).  Patients are able to view lab/test results, encounter notes, upcoming appointments, etc.  Non-urgent messages can be sent to your provider as well.   To learn more about what you can do with MyChart, go to [email or web address].    Your next appointment:   1 year(s)  The format for your next appointment:   In Person  Provider:   Buford Dresser, MD   You have what sounds like BPPV (benign paroxysmal positional vertigo). You can ask if either your primary care or ENT has someone that does the maneuvers to dislodge the crystals.

## 2020-04-28 DIAGNOSIS — R030 Elevated blood-pressure reading, without diagnosis of hypertension: Secondary | ICD-10-CM | POA: Diagnosis not present

## 2020-04-28 DIAGNOSIS — Z6837 Body mass index (BMI) 37.0-37.9, adult: Secondary | ICD-10-CM | POA: Diagnosis not present

## 2020-04-28 DIAGNOSIS — M542 Cervicalgia: Secondary | ICD-10-CM | POA: Insufficient documentation

## 2020-04-29 ENCOUNTER — Other Ambulatory Visit: Payer: Self-pay | Admitting: Student

## 2020-04-29 DIAGNOSIS — M542 Cervicalgia: Secondary | ICD-10-CM

## 2020-05-29 ENCOUNTER — Other Ambulatory Visit: Payer: Medicare Other

## 2020-06-02 ENCOUNTER — Encounter: Payer: Self-pay | Admitting: Internal Medicine

## 2020-06-02 ENCOUNTER — Ambulatory Visit: Payer: Medicare Other | Admitting: Nurse Practitioner

## 2020-06-02 NOTE — Progress Notes (Deleted)
Referring Provider: Sharion Balloon, FNP Primary Care Physician:  Sharion Balloon, FNP Primary GI:  Dr. Gala Romney  No chief complaint on file.   HPI:   Brittany Durham is a 50 y.o. female who presents for 90-month follow-up. Patient was last seen in our office 12/02/2019 for follow-up on GERD and history of preventative screening for hepatitis C. Colonoscopy on file 2018 next due in 2028. History of NSAID induced PUD documented he'll on follow-up EGD and Protonix 40 mg daily typically controls GERD systems historically. Does intermittently use NSAIDs despite previous recommendations.  At her last visit noted GERD doing well With no breakthrough. No dysphagia. No other overt GI complaints. Never been screened for hepatitis C virus and agreed to one-time screening as currently recommended by CDC and USPS TF. Recommended continue Protonix 40 mg daily, serologic screening with HCV antibody, follow-up in 6 months.  Labs are completed 01/14/2020 which found HCV antibody negative.  It appears patient was seen in the emergency department 01/15/2020 for dizziness and IDA. She presented with weakness and minor dizziness. Denied hematochezia or melena. No other significant symptoms. CBC was collected which found a significant drop in her hemoglobin of 9.2 which was microcytic and hypochromic. Rectal exam was heme-negative. She was treated with Antivert for possible inner ear issues. CMP completed the same time was completely normal. MRI of the brain was also completed to rule out neurological issues which was found to be unremarkable.  She had a follow-up office visit with primary care 01/26/2020 where and addressed hypertension and vertigo. No mention of IDA.  Today she states she is doing okay overall.   Past Medical History:  Diagnosis Date  . Asthma    childhood  . Carotidynia   . DDD (degenerative disc disease), cervical   . Hypertension   . Knee pain    left meniscus tear s/p repair 11/2014  .  Seizures ([place])   . Stuttering    patient reports this started after her knee surgery in 11/2014.   Marland Kitchen Thyroid nodule     Past Surgical History:  Procedure Laterality Date  . ANTERIOR CERVICAL DECOMP/DISCECTOMY FUSION N/A 03/20/2019   Procedure: Anterior Cervical Decompression Fusion - Cervical Four-Cervical Five - Cervical Five-Cervical Six;  Surgeon: Eustace Moore, MD;  Location: [facility];  Service: Neurosurgery;  Laterality: N/A;  Anterior Cervical Decompression Fusion - Cervical Four-Cervical Five - Cervical Five-Cervical Six  . COLONOSCOPY N/A 08/23/2016   Procedure: COLONOSCOPY;  Surgeon: Daneil Dolin, MD;  [facility];  Service: Endoscopy;  Laterality: N/A;  1:00pm  . ESOPHAGOGASTRODUODENOSCOPY N/A 05/03/2016   Procedure: ESOPHAGOGASTRODUODENOSCOPY (EGD);  Surgeon: Daneil Dolin, MD;  [facility];  Service: Endoscopy;  Laterality: N/A;  . ESOPHAGOGASTRODUODENOSCOPY N/A 08/23/2016   Procedure: ESOPHAGOGASTRODUODENOSCOPY (EGD);  Surgeon: Daneil Dolin, MD;  [facility];  Service: Endoscopy;  Laterality: N/A;  . KNEE ARTHROSCOPY WITH MEDIAL MENISECTOMY Left 12/09/2014   Procedure: KNEE ARTHROSCOPY WITH PARTIAL MEDIAL AND LATERAL MENISECTOMY;  Surgeon: Sanjuana Kava, MD;  [facility];  Service: Orthopedics;  Laterality: Left;  . TUBAL LIGATION    . WISDOM TOOTH EXTRACTION      Current Outpatient Medications  Medication Sig Dispense Refill  . amLODipine (NORVASC) 10 MG tablet TAKE 1 TABLET ONCE DAILY 90 tablet 1  . baclofen (LIORESAL) 10 MG tablet TAKE  (1)  TABLET  THREE TIMES DAILY. 90 tablet 0  . diclofenac (VOLTAREN) 75 MG EC tablet Take 75  mg by mouth daily.    Marland Kitchen escitalopram (LEXAPRO) 10 MG tablet Take 1 tablet (10 mg total) by mouth daily. 90 tablet 3  . lisinopril (ZESTRIL) 20 MG tablet TAKE 1 TABLET DAILY 90 tablet 1  . meclizine (ANTIVERT) 25 MG tablet Take 1 tablet (25 mg total) by mouth 3 (three) times daily as needed for dizziness. 15  tablet 0  . pantoprazole (PROTONIX) 40 MG tablet TAKE 1 TABLET ONCE DAILY BEFORE BREAKFAST 30 tablet 5  . Vitamin D, Ergocalciferol, (DRISDOL) 1.25 MG (50000 UNIT) CAPS capsule Take 50,000 Units by mouth once a week.     No current facility-administered medications for this visit.    Allergies as of 06/02/2020  . (No Known Allergies)    Family History  Problem Relation Age of Onset  . Hypertension Mother   . Lung cancer Father   . Hypertension Brother   . Colon cancer Neg Hx   . Gastric cancer Neg Hx   . Esophageal cancer Neg Hx     Social History   Socioeconomic History  . Marital status: Single    Spouse name: Not on file  . Number of children: 1  . Years of education: 48  . Highest education level: Not on file  Occupational History  . Occupation: Umemployed    Comment: Disability  Tobacco Use  . Smoking status: Never Smoker  . Smokeless tobacco: Never Used  Vaping Use  . Vaping Use: Never used  Substance and Sexual Activity  . Alcohol use: No  . Drug use: No  . Sexual activity: Not Currently    Birth control/protection: Surgical  Other Topics Concern  . Not on file  Social History Narrative   Lives at home alone.   Right-handed.   Occasional caffeine use.   Social Determinants of Health   Financial Resource Strain: Not on file  Food Insecurity: Not on file  Transportation Needs: Not on file  Physical Activity: Not on file  Stress: Not on file  Social Connections: Not on file    Subjective:*** Review of Systems  Constitutional: Negative for chills, fever, malaise/fatigue and weight loss.  HENT: Negative for congestion and sore throat.   Respiratory: Negative for cough and shortness of breath.   Cardiovascular: Negative for chest pain and palpitations.  Gastrointestinal: Negative for abdominal pain, blood in stool, diarrhea, melena, nausea and vomiting.  Musculoskeletal: Negative for joint pain and myalgias.  Skin: Negative for rash.  Neurological:  Negative for dizziness and weakness.  Endo/Heme/Allergies: Does not bruise/bleed easily.  Psychiatric/Behavioral: Negative for depression. The patient is not nervous/anxious.   All other systems reviewed and are negative.    Objective: There were no vitals taken for this visit. Physical Exam Vitals and nursing note reviewed.  Constitutional:      General: She is not in acute distress.    Appearance: Normal appearance. She is well-developed. She is not ill-appearing, toxic-appearing or diaphoretic.  HENT:     Head: Normocephalic and atraumatic.     Nose: No congestion or rhinorrhea.  Eyes:     General: No scleral icterus. Cardiovascular:     Rate and Rhythm: Normal rate and regular rhythm.     Heart sounds: Normal heart sounds.  Pulmonary:     Effort: Pulmonary effort is normal. No respiratory distress.     Breath sounds: Normal breath sounds.  Abdominal:     General: Bowel sounds are normal.     Palpations: Abdomen is soft. There is no hepatomegaly, splenomegaly  or mass.     Tenderness: There is no abdominal tenderness. There is no guarding or rebound.     Hernia: No hernia is present.  Skin:    General: Skin is warm and dry.     Coloration: Skin is not jaundiced.     Findings: No rash.  Neurological:     General: No focal deficit present.     Mental Status: She is alert and oriented to person, place, and time.  Psychiatric:        Attention and Perception: Attention normal.        Mood and Affect: Mood normal.        Speech: Speech normal.        Behavior: Behavior normal.        Thought Content: Thought content normal.        Cognition and Memory: Cognition and memory normal.      Assessment:  ***   Plan: ***    Thank you for allowing [place] to participate in the care of [place], DNP, AGNP-C Adult & Gerontological Nurse Practitioner [facility]   06/02/2020 1:12 PM   Disclaimer: This note was dictated with  voice recognition software. Similar sounding words can inadvertently be transcribed and may not be corrected upon review.

## 2020-06-03 ENCOUNTER — Telehealth: Payer: Self-pay | Admitting: Family

## 2020-06-03 NOTE — Telephone Encounter (Signed)
Pt having MRI done on 1/29 and is claustrophobic so she would like something to take for when she has the MRI

## 2020-06-06 MED ORDER — ALPRAZOLAM 0.5 MG PO TABS: 0.5000 mg | ORAL_TABLET | Freq: Once | ORAL | 0 refills | Status: DC

## 2020-06-06 MED ORDER — ALPRAZOLAM 0.5 MG PO TABS
0.5000 mg | ORAL_TABLET | Freq: Once | ORAL | 0 refills | Status: AC
Start: 2020-06-06 — End: 2020-06-06

## 2020-06-06 NOTE — Addendum Note (Signed)
Addended by: Evelina Dun A on: 06/06/2020 03:28 PM   Modules accepted: Orders

## 2020-06-06 NOTE — Telephone Encounter (Signed)
Patient notified

## 2020-06-06 NOTE — Telephone Encounter (Signed)
Xanax 0.5 mg Prescription sent to pharmacy

## 2020-06-08 NOTE — Telephone Encounter (Signed)
Left message to call back  

## 2020-06-11 ENCOUNTER — Other Ambulatory Visit: Payer: Medicare Other

## 2020-06-20 ENCOUNTER — Other Ambulatory Visit: Payer: Self-pay | Admitting: Family

## 2020-07-08 ENCOUNTER — Other Ambulatory Visit: Payer: Self-pay | Admitting: Student

## 2020-07-08 DIAGNOSIS — M542 Cervicalgia: Secondary | ICD-10-CM

## 2020-07-15 ENCOUNTER — Encounter: Payer: Self-pay | Admitting: Nurse Practitioner

## 2020-07-15 ENCOUNTER — Other Ambulatory Visit: Payer: Self-pay

## 2020-07-15 ENCOUNTER — Ambulatory Visit (INDEPENDENT_AMBULATORY_CARE_PROVIDER_SITE_OTHER): Payer: Medicare Other | Admitting: Nurse Practitioner

## 2020-07-15 VITALS — BP 120/73 | HR 110 | Temp 97.9°F | Ht 67.0 in | Wt 241.2 lb

## 2020-07-15 DIAGNOSIS — F321 Major depressive disorder, single episode, moderate: Secondary | ICD-10-CM | POA: Diagnosis not present

## 2020-07-15 DIAGNOSIS — F411 Generalized anxiety disorder: Secondary | ICD-10-CM | POA: Diagnosis not present

## 2020-07-15 DIAGNOSIS — R5383 Other fatigue: Secondary | ICD-10-CM | POA: Diagnosis not present

## 2020-07-15 DIAGNOSIS — R6889 Other general symptoms and signs: Secondary | ICD-10-CM | POA: Diagnosis not present

## 2020-07-15 DIAGNOSIS — R42 Dizziness and giddiness: Secondary | ICD-10-CM

## 2020-07-15 DIAGNOSIS — D649 Anemia, unspecified: Secondary | ICD-10-CM | POA: Diagnosis not present

## 2020-07-15 MED ORDER — BUSPIRONE HCL 5 MG PO TABS: 5.0000 mg | ORAL_TABLET | Freq: Three times a day (TID) | ORAL | 0 refills | Status: DC

## 2020-07-15 MED ORDER — ESCITALOPRAM OXALATE 20 MG PO TABS: 20.0000 mg | ORAL_TABLET | Freq: Every day | ORAL | 0 refills | Status: DC

## 2020-07-15 NOTE — Assessment & Plan Note (Signed)
Increase Lexapro from 10 mg tablet to 20 mg tablet daily for uncontrolled signs and symptoms of depression.  Completed PHQ-9.  Follow-up in 6 weeks.  Rx sent to pharmacy

## 2020-07-15 NOTE — Assessment & Plan Note (Signed)
Started patient on BuSpar 5 mg tablet daily follow-up in 6 weeks.

## 2020-07-15 NOTE — Patient Instructions (Signed)
[email or web address]">  Generalized Anxiety Disorder, Adult Generalized anxiety disorder (GAD) is a mental health condition. Unlike normal worries, anxiety related to GAD is not triggered by a specific event. These worries do not fade or get better with time. GAD interferes with relationships, work, and school. GAD symptoms can vary from mild to severe. People with severe GAD can have intense waves of anxiety with physical symptoms that are similar to panic attacks. What are the causes? The exact cause of GAD is not known, but the following are believed to have an impact:  Differences in natural brain chemicals.  Genes passed down from parents to children.  Differences in the way threats are perceived.  Development during childhood.  Personality. What increases the risk? The following factors may make you more likely to develop this condition:  Being female.  Having a family history of anxiety disorders.  Being very shy.  Experiencing very stressful life events, such as the death of a loved one.  Having a very stressful family environment. What are the signs or symptoms? People with GAD often worry excessively about many things in their lives, such as their health and family. Symptoms may also include:  Mental and emotional symptoms: ? Worrying excessively about natural disasters. ? Fear of being late. ? Difficulty concentrating. ? Fears that others are judging your performance.  Physical symptoms: ? Fatigue. ? Headaches, muscle tension, muscle twitches, trembling, or feeling shaky. ? Feeling like your heart is pounding or beating very fast. ? Feeling out of breath or like you cannot take a deep breath. ? Having trouble falling asleep or staying asleep, or experiencing restlessness. ? Sweating. ? Nausea, diarrhea, or irritable bowel syndrome (IBS).  Behavioral symptoms: ? Experiencing erratic moods or irritability. ? Avoidance of new situations. ? Avoidance of  people. ? Extreme difficulty making decisions. How is this diagnosed? This condition is diagnosed based on your symptoms and medical history. You will also have a physical exam. Your health care provider may perform tests to rule out other possible causes of your symptoms. To be diagnosed with GAD, a person must have anxiety that:  Is out of his or her control.  Affects several different aspects of his or her life, such as work and relationships.  Causes distress that makes him or her unable to take part in normal activities.  Includes at least three symptoms of GAD, such as restlessness, fatigue, trouble concentrating, irritability, muscle tension, or sleep problems. Before your health care provider can confirm a diagnosis of GAD, these symptoms must be present more days than they are not, and they must last for 6 months or longer. How is this treated? This condition may be treated with:  Medicine. Antidepressant medicine is usually prescribed for long-term daily control. Anti-anxiety medicines may be added in severe cases, especially when panic attacks occur.  Talk therapy (psychotherapy). Certain types of talk therapy can be helpful in treating GAD by providing support, education, and guidance. Options include: ? Cognitive behavioral therapy (CBT). People learn coping skills and self-calming techniques to ease their physical symptoms. They learn to identify unrealistic thoughts and behaviors and to replace them with more appropriate thoughts and behaviors. ? Acceptance and commitment therapy (ACT). This treatment teaches people how to be mindful as a way to cope with unwanted thoughts and feelings. ? Biofeedback. This process trains you to manage your body's response (physiological response) through breathing techniques and relaxation methods. You will work with a therapist while machines are used to monitor your physical   symptoms.  Stress management techniques. These include yoga,  meditation, and exercise. A mental health specialist can help determine which treatment is best for you. Some people see improvement with one type of therapy. However, other people require a combination of therapies.   Follow these instructions at home: Lifestyle  Maintain a consistent routine and schedule.  Anticipate stressful situations. Create a plan, and allow extra time to work with your plan.  Practice stress management or self-calming techniques that you have learned from your therapist or your health care provider. General instructions  Take over-the-counter and prescription medicines only as told by your health care provider.  Understand that you are likely to have setbacks. Accept this and be kind to yourself as you persist to take better care of yourself.  Recognize and accept your accomplishments, even if you judge them as small.  Keep all follow-up visits as told by your health care provider. This is important. Contact a health care provider if:  Your symptoms do not get better.  Your symptoms get worse.  You have signs of depression, such as: ? A persistently sad or irritable mood. ? Loss of enjoyment in activities that used to bring you joy. ? Change in weight or eating. ? Changes in sleeping habits. ? Avoiding friends or family members. ? Loss of energy for normal tasks. ? Feelings of guilt or worthlessness. Get help right away if:  You have serious thoughts about hurting yourself or others. If you ever feel like you may hurt yourself or others, or have thoughts about taking your own life, get help right away. Go to your nearest emergency department or:  Call your local emergency services (911 in the [place]  Call a suicide crisis helpline, such as the [place] at (408)299-7992. This is open 24 hours a day in the [place]  Text the Crisis Text Line at 502 660 7807 (in the [place].). Summary  Generalized anxiety disorder (GAD) is a mental  health condition that involves worry that is not triggered by a specific event.  People with GAD often worry excessively about many things in their lives, such as their health and family.  GAD may cause symptoms such as restlessness, trouble concentrating, sleep problems, frequent sweating, nausea, diarrhea, headaches, and trembling or muscle twitching.  A mental health specialist can help determine which treatment is best for you. Some people see improvement with one type of therapy. However, other people require a combination of therapies. This information is not intended to replace advice given to you by your health care provider. Make sure you discuss any questions you have with your health care provider. Document Revised: 02/18/2019 Document Reviewed: 02/18/2019 Elsevier Patient Education  2021 [email or web address]">  Depression Screening Depression screening is a tool that your health care provider can use to learn if you have symptoms of depression. Depression is a common condition with many symptoms that are also often found in other conditions. Depression is treatable, but it must first be diagnosed. You may not know that certain feelings, thoughts, and behaviors that you are having can be symptoms of depression. Taking a depression screening test can help you and your health care provider decide if you need more assessment, or if you should be referred to a mental health care provider. What are the screening tests?  You may have a physical exam to see if another condition is affecting your mental health. You may have a blood or urine sample taken during the physical exam.  You  may be interviewed using a screening tool that was developed from research, such as one of these: ? Patient Health Questionnaire (PHQ). This is a set of either 2 or 9 questions. A health care provider who has been trained to score  this screening test uses a guide to assess if your symptoms suggest that you may have depression. ? Hamilton Depression Rating Scale (HAM-D). This is a set of either 17 or 24 questions. You may be asked to take it again during or after your treatment, to see if your depression has gotten better. ? Beck Depression Inventory (BDI). This is a set of 21 multiple choice questions. Your health care provider scores your answers to assess:  Your level of depression, ranging from mild to severe.  Your response to treatment.  Your health care provider may talk with you about your daily activities, such as eating, sleeping, work, and recreation, and ask if you have had any changes in activity.  Your health care provider may ask you to see a mental health specialist, such as a psychiatrist or psychologist, for more evaluation. Who should be screened for depression?  All adults, including adults with a family history of a mental health disorder.  Adolescents who are 59-25 years old.  People who are recovering from a myocardial infarction (MI).  Pregnant women, or women who have given birth.  People who have a long-term (chronic) illness.  Anyone who has been diagnosed with another type of a mental health disorder.  Anyone who has symptoms that could show depression.   What do my results mean? Your health care provider will review the results of your depression screening, physical exam, and lab tests. Positive screens suggest that you may have depression. Screening is the first step in getting the care that you may need. It is up to you to get your screening results. Ask your health care provider, or the department that is doing your screening tests, when your results will be ready. Talk with your health care provider about your results and diagnosis. A diagnosis of depression is made using the Diagnostic and Statistical Manual of Mental Disorders (DSM-V). This is a book that lists the number and type  of symptoms that must be present for a health care provider to give a specific diagnosis.  Your health care provider may work with you to treat your symptoms of depression, or your health care provider may help you find a mental health provider who can assess, diagnose, and treat your depression. Get help right away if:  You have thoughts about hurting yourself or others. If you ever feel like you may hurt yourself or others, or have thoughts about taking your own life, get help right away. You can go to your nearest emergency department or call:  Your local emergency services (911 in the [place]  A suicide crisis helpline, such as the [facility] at 5026790373. This is open 24 hours a day. Summary  Depression screening is the first step in getting the help that you may need.  If your screening test shows symptoms of depression (is positive), your health care provider may ask you to see a mental health provider.  Anyone who is age 34 or older should be screened for depression. This information is not intended to replace advice given to you by your health care provider. Make sure you discuss any questions you have with your health care provider. Document Revised: 10/22/2019 Document Reviewed: 10/22/2019 Elsevier Patient Education  2021  [employer] Dizziness Dizziness is a common problem. It makes you feel unsteady or light-headed. You may feel like you are about to pass out (faint). Dizziness can lead to getting hurt if you stumble or fall. Dizziness can be caused by many things, including:  Medicines.  Not having enough water in your body (dehydration).  Illness. Follow these instructions at home: Eating and drinking  Drink enough fluid to keep your pee (urine) clear or pale yellow. This helps to keep you from getting dehydrated. Try to drink more clear fluids, such as water.  Do not drink alcohol.  Limit how much caffeine you drink or eat, if your  doctor tells you to do that.  Limit how much salt (sodium) you drink or eat, if your doctor tells you to do that.   Activity  Avoid making quick movements. ? When you stand up from sitting in a chair, steady yourself until you feel okay. ? In the morning, first sit up on the side of the bed. When you feel okay, stand slowly while you hold onto something. Do this until you know that your balance is fine.  If you need to stand in one place for a long time, move your legs often. Tighten and relax the muscles in your legs while you are standing.  Do not drive or use heavy machinery if you feel dizzy.  Avoid bending down if you feel dizzy. Place items in your home so you can reach them easily without leaning over.   Lifestyle  Do not use any products that contain nicotine or tobacco, such as cigarettes and e-cigarettes. If you need help quitting, ask your doctor.  Try to lower your stress level. You can do this by using methods such as yoga or meditation. Talk with your doctor if you need help. General instructions  Watch your dizziness for any changes.  Take over-the-counter and prescription medicines only as told by your doctor. Talk with your doctor if you think that you are dizzy because of a medicine that you are taking.  Tell a friend or a family member that you are feeling dizzy. If he or she notices any changes in your behavior, have this person call your doctor.  Keep all follow-up visits as told by your doctor. This is important. Contact a doctor if:  Your dizziness does not go away.  Your dizziness or light-headedness gets worse.  You feel sick to your stomach (nauseous).  You have trouble hearing.  You have new symptoms.  You are unsteady on your feet.  You feel like the room is spinning. Get help right away if:  You throw up (vomit) or have watery poop (diarrhea), and you cannot eat or drink anything.  You have  trouble: ? Talking. ? Walking. ? Swallowing. ? Using your arms, hands, or legs.  You feel generally weak.  You are not thinking clearly, or you have trouble forming sentences. A friend or family member may notice this.  You have: ? Chest pain. ? Pain in your belly (abdomen). ? Shortness of breath. ? Sweating.  Your vision changes.  You are bleeding.  You have a very bad headache.  You have neck pain or a stiff neck.  You have a fever. These symptoms may be an emergency. Do not wait to see if the symptoms will go away. Get medical help right away. Call your local emergency services (911 in the [place] Do not drive yourself to the hospital. Summary  Dizziness makes you  feel unsteady or light-headed. You may feel like you are about to pass out (faint).  Drink enough fluid to keep your pee (urine) clear or pale yellow. Do not drink alcohol.  Avoid making quick movements if you feel dizzy.  Watch your dizziness for any changes. This information is not intended to replace advice given to you by your health care provider. Make sure you discuss any questions you have with your health care provider. Document Revised: 01/20/2020 Document Reviewed: 05/17/2016 Elsevier Patient Education  [place].

## 2020-07-15 NOTE — Progress Notes (Signed)
Established Patient Office Visit  Subjective:  Patient ID: Brittany Durham, female    DOB: 1970/07/18  Age: 50 y.o. MRN: [id number]  CC:  Chief Complaint  Patient presents with  . Fatigue  . Shortness of Breath    HPI Brittany Durham presents for  Dizziness  She reports chronic dizziness. She describes it as feeling light headed, occurs constantly, and typically lasts a few munites.  It typically occurs when she is turning head from side to side and standing up from siting position. It is usually relieved by medications such as Meclizine. She has not started new medications around the time the dizziness started.  Patient is also reporting that medication is not therapeutic.  Associated symptoms: No hearing loss Yes tinnitus  No chest discomfort No heart palpitations  No heart racing No numbness or tingling of extremities  No nausea No vomiting  No speech difficulty No visual changes    Wt Readings from Last 3 Encounters:  07/15/20 241 lb 3.2 oz (109.4 kg)  04/27/20 239 lb 12.8 oz (108.8 kg)  03/22/20 220 lb (99.8 kg)    BP Readings from Last 3 Encounters:  07/15/20 120/73  04/27/20 130/84  01/26/20 132/85      Lab Results  Component Value Date   WBC 7.0 01/15/2020   HGB 9.2 (L) 01/15/2020   HCT 31.1 (L) 01/15/2020   MCV 84.1 01/15/2020   PLT 231 01/15/2020   Lab Results  Component Value Date   NA 138 01/15/2020   K 3.7 01/15/2020   CO2 26 01/15/2020   BUN 14 01/15/2020   CREATININE 0.64 01/15/2020   CALCIUM 9.3 01/15/2020   GLUCOSE 95 01/15/2020     -----------------------------------------------------------------------------------------   Depression & Anxiety: Patient complains of depression. She complains of depressed mood. Onset was approximately a few month ago, unchanged since that time.  She denies current suicidal and homicidal plan or intent.   Family history significant for no psychiatric illness.Possible organic causes contributing are: none.  Risk  factors: previous episode of depression Previous treatment includes Lexapro . She complains of the following side effects from the treatment: none.  Fatigue  She reports recurrent fatigue which she describes as a lack of energy, feeling exhausted and feeling sleepy. It began several months ago and occurs all the time. It is described as severe and gradually worsening. She has not started medications around the time the fatigue started.   Associated symptoms: Yes arthralgias No bleeding  No melena No chest discomfort  No heart palpitations No heart racing   No dyspnea Yes feeling depressed  Yes feeling anxious or under stress No fevers  No loss of appetite No nausea  No vomiting Yes sleeping problems    Wt Readings from Last 3 Encounters:  07/15/20 241 lb 3.2 oz (109.4 kg)  04/27/20 239 lb 12.8 oz (108.8 kg)  03/22/20 220 lb (99.8 kg)    Lab Results  Component Value Date   WBC 7.0 01/15/2020   HGB 9.2 (L) 01/15/2020   HCT 31.1 (L) 01/15/2020   MCV 84.1 01/15/2020   PLT 231 01/15/2020   Lab Results  Component Value Date   TSH 2.660 06/30/2018   Lab Results  Component Value Date   NA 138 01/15/2020   K 3.7 01/15/2020   CO2 26 01/15/2020   BUN 14 01/15/2020   CREATININE 0.64 01/15/2020   CALCIUM 9.3 01/15/2020   GLUCOSE 95 01/15/2020     ---------------------------------------------------------------------------------------------------  Past Medical History:  Diagnosis Date  .  Asthma    childhood  . Carotidynia   . DDD (degenerative disc disease), cervical   . Hypertension   . Knee pain    left meniscus tear s/p repair 11/2014  . Seizures ([place])   . Stuttering    patient reports this started after her knee surgery in 11/2014.   Marland Kitchen Thyroid nodule     Past Surgical History:  Procedure Laterality Date  . ANTERIOR CERVICAL DECOMP/DISCECTOMY FUSION N/A 03/20/2019   Procedure: Anterior Cervical Decompression Fusion - Cervical Four-Cervical Five - Cervical  Five-Cervical Six;  Surgeon: Eustace Moore, MD;  Location: [facility];  Service: Neurosurgery;  Laterality: N/A;  Anterior Cervical Decompression Fusion - Cervical Four-Cervical Five - Cervical Five-Cervical Six  . COLONOSCOPY N/A 08/23/2016   Procedure: COLONOSCOPY;  Surgeon: Daneil Dolin, MD;  [facility];  Service: Endoscopy;  Laterality: N/A;  1:00pm  . ESOPHAGOGASTRODUODENOSCOPY N/A 05/03/2016   Procedure: ESOPHAGOGASTRODUODENOSCOPY (EGD);  Surgeon: Daneil Dolin, MD;  Location: [facility];  Service: Endoscopy;  Laterality: N/A;  . ESOPHAGOGASTRODUODENOSCOPY N/A 08/23/2016   Procedure: ESOPHAGOGASTRODUODENOSCOPY (EGD);  Surgeon: Daneil Dolin, MD;  [facility];  Service: Endoscopy;  Laterality: N/A;  . KNEE ARTHROSCOPY WITH MEDIAL MENISECTOMY Left 12/09/2014   Procedure: KNEE ARTHROSCOPY WITH PARTIAL MEDIAL AND LATERAL MENISECTOMY;  Surgeon: Sanjuana Kava, MD;  Location: [facility];  Service: Orthopedics;  Laterality: Left;  . TUBAL LIGATION    . WISDOM TOOTH EXTRACTION      Family History  Problem Relation Age of Onset  . Hypertension Mother   . Lung cancer Father   . Hypertension Brother   . Colon cancer Neg Hx   . Gastric cancer Neg Hx   . Esophageal cancer Neg Hx     Social History   Socioeconomic History  . Marital status: Single    Spouse name: Not on file  . Number of children: 1  . Years of education: 76  . Highest education level: Not on file  Occupational History  . Occupation: Umemployed    Comment: Disability  Tobacco Use  . Smoking status: Never Smoker  . Smokeless tobacco: Never Used  Vaping Use  . Vaping Use: Never used  Substance and Sexual Activity  . Alcohol use: No  . Drug use: No  . Sexual activity: Not Currently    Birth control/protection: Surgical  Other Topics Concern  . Not on file  Social History Narrative   Lives at home alone.   Right-handed.   Occasional caffeine use.   Social Determinants of Health   Financial  Resource Strain: Not on file  Food Insecurity: Not on file  Transportation Needs: Not on file  Physical Activity: Not on file  Stress: Not on file  Social Connections: Not on file  Intimate Partner Violence: Not on file    Outpatient Medications Prior to Visit  Medication Sig Dispense Refill  . amLODipine (NORVASC) 10 MG tablet TAKE 1 TABLET ONCE DAILY 90 tablet 1  . baclofen (LIORESAL) 10 MG tablet TAKE  (1)  TABLET  THREE TIMES DAILY. 90 tablet 0  . diclofenac (VOLTAREN) 75 MG EC tablet Take 75 mg by mouth daily.    Marland Kitchen lisinopril (ZESTRIL) 20 MG tablet TAKE 1 TABLET DAILY 90 tablet 1  . meclizine (ANTIVERT) 25 MG tablet Take 1 tablet (25 mg total) by mouth 3 (three) times daily as needed for dizziness. 15 tablet 0  . pantoprazole (PROTONIX) 40 MG tablet TAKE 1 TABLET ONCE DAILY  BEFORE BREAKFAST 30 tablet 5  . Vitamin D, Ergocalciferol, (DRISDOL) 1.25 MG (50000 UNIT) CAPS capsule Take 50,000 Units by mouth once a week.    . escitalopram (LEXAPRO) 10 MG tablet TAKE 1 TABLET DAILY 90 tablet 0   No facility-administered medications prior to visit.    No Known Allergies  ROS Review of Systems  Constitutional: Positive for fatigue.  HENT: Negative.   Respiratory: Negative.   Cardiovascular: Negative.   Gastrointestinal: Negative.   Genitourinary: Negative.   Musculoskeletal: Negative.   Psychiatric/Behavioral: Negative for self-injury, sleep disturbance and suicidal ideas. The patient is nervous/anxious.   All other systems reviewed and are negative.     Objective:    Physical Exam Vitals reviewed.  Constitutional:      Appearance: She is well-developed.  HENT:     Head: Normocephalic.     Right Ear: There is no impacted cerumen.     Left Ear: There is no impacted cerumen.     Ears:     Comments: Tinnitus    Mouth/Throat:     Mouth: Mucous membranes are moist.     Pharynx: Oropharynx is clear.  Eyes:     Conjunctiva/sclera: Conjunctivae normal.  Cardiovascular:      Pulses: Normal pulses.     Heart sounds: Normal heart sounds.  Pulmonary:     Effort: Pulmonary effort is normal.     Breath sounds: Normal breath sounds.  Abdominal:     General: Bowel sounds are normal.  Musculoskeletal:     Cervical back: Normal range of motion.  Skin:    General: Skin is warm.  Neurological:     Mental Status: She is alert and oriented to person, place, and time.     Comments: Vertigo  Psychiatric:     Comments: Positive for anxiety and depression     BP 120/73   Pulse (!) 110   Temp 97.9 F (36.6 C)   Ht 5\' 7"  (1.702 m)   Wt 241 lb 3.2 oz (109.4 kg)   SpO2 100%   BMI 37.78 kg/m  Wt Readings from Last 3 Encounters:  07/15/20 241 lb 3.2 oz (109.4 kg)  04/27/20 239 lb 12.8 oz (108.8 kg)  03/22/20 220 lb (99.8 kg)     Health Maintenance Due  Topic Date Due  . MAMMOGRAM  Never done  . TETANUS/TDAP  Never done  . COVID-19 Vaccine (3 - Booster for Moderna series) 02/25/2020  . PAP SMEAR-Modifier  04/30/2020    There are no preventive care reminders to display for this patient.  Lab Results  Component Value Date   TSH 2.660 06/30/2018   Lab Results  Component Value Date   WBC 7.0 01/15/2020   HGB 9.2 (L) 01/15/2020   HCT 31.1 (L) 01/15/2020   MCV 84.1 01/15/2020   PLT 231 01/15/2020   Lab Results  Component Value Date   NA 138 01/15/2020   K 3.7 01/15/2020   CO2 26 01/15/2020   GLUCOSE 95 01/15/2020   BUN 14 01/15/2020   CREATININE 0.64 01/15/2020   BILITOT 0.6 01/15/2020   ALKPHOS 98 01/15/2020   AST 15 01/15/2020   ALT 23 01/15/2020   PROT 7.6 01/15/2020   ALBUMIN 3.7 01/15/2020   CALCIUM 9.3 01/15/2020   ANIONGAP 10 01/15/2020   Lab Results  Component Value Date   CHOL 196 01/29/2019   Lab Results  Component Value Date   HDL 58 01/29/2019   Lab Results  Component Value Date  [place] 121 (H) 01/29/2019   Lab Results  Component Value Date   TRIG 93 01/29/2019   Lab Results  Component Value Date   CHOLHDL 3.4  01/29/2019   Lab Results  Component Value Date   HGBA1C 5.1 12/12/2014      Assessment & Plan:   Problem List Items Addressed This Visit      Other   GAD (generalized anxiety disorder)    Started patient on BuSpar 5 mg tablet daily follow-up in 6 weeks.      Relevant Medications   escitalopram (LEXAPRO) 20 MG tablet   busPIRone (BUSPAR) 5 MG tablet   Vertigo - Primary    This is a chronic complaint for patient.  Patient has used meclizine 25 mg 3 times daily as needed with no therapeutic effect.  Patient also reports going to physical therapy with no resolution.  Encourage patient to continue taking meclizine as ordered, change Lexapro to 20 mg tablet to control depression and anxiety, follow-up with worsening or unresolved symptoms.      Depression, major, single episode, moderate (HCC)    Increase Lexapro from 10 mg tablet to 20 mg tablet daily for uncontrolled signs and symptoms of depression.  Completed PHQ-9.  Follow-up in 6 weeks.  Rx sent to pharmacy      Relevant Medications   escitalopram (LEXAPRO) 20 MG tablet   busPIRone (BUSPAR) 5 MG tablet   Other fatigue    Symptoms of fatigue not well controlled.  B12 ordered and TSH results pending.      Relevant Orders   TSH   Anemia Profile B     Flowsheet Row Office Visit from 07/15/2020 in [facility]  PHQ-9 Total Score 21     GAD 7 : Generalized Anxiety Score 07/15/2020 06/15/2019  Nervous, Anxious, on Edge 2 0  Control/stop worrying 2 2  Worry too much - different things 3 3  Trouble relaxing 3 3  Restless 0 3  Easily annoyed or irritable 2 3  Afraid - awful might happen 3 2  Total GAD 7 Score 15 16  Anxiety Difficulty Not difficult at all Not difficult at all     Meds ordered this encounter  Medications  . escitalopram (LEXAPRO) 20 MG tablet    Sig: Take 1 tablet (20 mg total) by mouth daily.    Dispense:  60 tablet    Refill:  0    Order Specific Question:   Supervising Provider     Answer:   Janora Norlander [[id number]]  . busPIRone (BUSPAR) 5 MG tablet    Sig: Take 1 tablet (5 mg total) by mouth 3 (three) times daily.    Dispense:  60 tablet    Refill:  0    Order Specific Question:   Supervising Provider    Answer:   Janora Norlander [[id number]]    Follow-up: Return if symptoms worsen or fail to improve.    Ivy Lynn, NP

## 2020-07-15 NOTE — Assessment & Plan Note (Signed)
Symptoms of fatigue not well controlled.  B12 ordered and TSH results pending.

## 2020-07-15 NOTE — Assessment & Plan Note (Signed)
This is a chronic complaint for patient.  Patient has used meclizine 25 mg 3 times daily as needed with no therapeutic effect.  Patient also reports going to physical therapy with no resolution.  Encourage patient to continue taking meclizine as ordered, change Lexapro to 20 mg tablet to control depression and anxiety, follow-up with worsening or unresolved symptoms.

## 2020-07-16 LAB — ANEMIA PROFILE B
Basophils Absolute: 0 10*3/uL (ref 0.0–0.2)
Basos: 0 %
EOS (ABSOLUTE): 0.1 10*3/uL (ref 0.0–0.4)
Eos: 1 %
Ferritin: 3 ng/mL — ABNORMAL LOW (ref 15–150)
Folate: 6.3 ng/mL (ref >3.0)
Hematocrit: 20.9 % — ABNORMAL LOW (ref 34.0–46.6)
Hemoglobin: 5.7 g/dL — CL (ref 11.1–15.9)
Immature Grans (Abs): 0 10*3/uL (ref 0.0–0.1)
Immature Granulocytes: 0 %
Iron Saturation: 4 % — CL (ref 15–55)
Iron: 15 ug/dL — ABNORMAL LOW (ref 27–159)
Lymphocytes Absolute: 1.9 10*3/uL (ref 0.7–3.1)
Lymphs: 24 %
MCH: 18.9 pg — ABNORMAL LOW (ref 26.6–33.0)
MCHC: 27.3 g/dL — ABNORMAL LOW (ref 31.5–35.7)
MCV: 69 fL — ABNORMAL LOW (ref 79–97)
Monocytes Absolute: 0.8 10*3/uL (ref 0.1–0.9)
Monocytes: 10 %
Neutrophils Absolute: 5.1 10*3/uL (ref 1.4–7.0)
Neutrophils: 65 %
Platelets: 334 10*3/uL (ref 150–450)
RBC: 3.01 x10E6/uL — ABNORMAL LOW (ref 3.77–5.28)
RDW: 17.6 % — ABNORMAL HIGH (ref 11.7–15.4)
Retic Ct Pct: 2.3 % (ref 0.6–2.6)
Total Iron Binding Capacity: 419 ug/dL (ref 250–450)
UIBC: 404 ug/dL (ref 131–425)
Vitamin B-12: 442 pg/mL (ref 232–1245)
WBC: 7.9 10*3/uL (ref 3.4–10.8)

## 2020-07-16 LAB — TSH: TSH: 2.81 u[IU]/mL (ref 0.450–4.500)

## 2020-07-18 ENCOUNTER — Emergency Department (HOSPITAL_COMMUNITY)
Admission: EM | Admit: 2020-07-18 | Discharge: 2020-07-18 | Disposition: A | Discharge status: Home / Self Care | Payer: Medicare Other | Attending: Emergency Medicine | Admitting: Emergency Medicine

## 2020-07-18 ENCOUNTER — Other Ambulatory Visit: Payer: Self-pay

## 2020-07-18 ENCOUNTER — Emergency Department (HOSPITAL_COMMUNITY): Payer: Medicare Other

## 2020-07-18 ENCOUNTER — Encounter (HOSPITAL_COMMUNITY): Payer: Self-pay | Admitting: *Deleted

## 2020-07-18 DIAGNOSIS — Z043 Encounter for examination and observation following other accident: Secondary | ICD-10-CM | POA: Diagnosis not present

## 2020-07-18 DIAGNOSIS — I10 Essential (primary) hypertension: Secondary | ICD-10-CM | POA: Diagnosis not present

## 2020-07-18 DIAGNOSIS — K644 Residual hemorrhoidal skin tags: Secondary | ICD-10-CM | POA: Insufficient documentation

## 2020-07-18 DIAGNOSIS — D649 Anemia, unspecified: Secondary | ICD-10-CM | POA: Insufficient documentation

## 2020-07-18 DIAGNOSIS — Z79899 Other long term (current) drug therapy: Secondary | ICD-10-CM | POA: Insufficient documentation

## 2020-07-18 DIAGNOSIS — D509 Iron deficiency anemia, unspecified: Secondary | ICD-10-CM

## 2020-07-18 DIAGNOSIS — J45909 Unspecified asthma, uncomplicated: Secondary | ICD-10-CM | POA: Insufficient documentation

## 2020-07-18 LAB — HEMOGLOBIN AND HEMATOCRIT, BLOOD
HCT: 25.5 % — ABNORMAL LOW (ref 36.0–46.0)
Hemoglobin: 7.5 g/dL — ABNORMAL LOW (ref 12.0–15.0)

## 2020-07-18 LAB — COMPREHENSIVE METABOLIC PANEL
ALT: 15 U/L (ref 0–44)
AST: 16 U/L (ref 15–41)
Albumin: 3.8 g/dL (ref 3.5–5.0)
Alkaline Phosphatase: 93 U/L (ref 38–126)
Anion gap: 8 (ref 5–15)
BUN: 9 mg/dL (ref 6–20)
CO2: 26 mmol/L (ref 22–32)
Calcium: 9.1 mg/dL (ref 8.9–10.3)
Chloride: 102 mmol/L (ref 98–111)
Creatinine, Ser: 0.71 mg/dL (ref 0.44–1.00)
GFR, Estimated: >60 mL/min (ref >60)
Glucose, Bld: 96 mg/dL (ref 70–99)
Potassium: 3.9 mmol/L (ref 3.5–5.1)
Sodium: 136 mmol/L (ref 135–145)
Total Bilirubin: 0.4 mg/dL (ref 0.3–1.2)
Total Protein: 7.5 g/dL (ref 6.5–8.1)

## 2020-07-18 LAB — CBC
HCT: 21.5 % — ABNORMAL LOW (ref 36.0–46.0)
Hemoglobin: 5.7 g/dL — CL (ref 12.0–15.0)
MCH: 19.1 pg — ABNORMAL LOW (ref 26.0–34.0)
MCHC: 26.5 g/dL — ABNORMAL LOW (ref 30.0–36.0)
MCV: 71.9 fL — ABNORMAL LOW (ref 80.0–100.0)
Platelets: 288 10*3/uL (ref 150–400)
RBC: 2.99 MIL/uL — ABNORMAL LOW (ref 3.87–5.11)
RDW: 18.4 % — ABNORMAL HIGH (ref 11.5–15.5)
WBC: 8 10*3/uL (ref 4.0–10.5)
nRBC: 0.3 % — ABNORMAL HIGH (ref 0.0–0.2)

## 2020-07-18 LAB — PREPARE RBC (CROSSMATCH)

## 2020-07-18 MED ORDER — SODIUM CHLORIDE 0.9 % IV SOLN
10.0000 mL/h | Freq: Once | INTRAVENOUS | Status: AC
  Administered 2020-07-18: 10 mL/h via INTRAVENOUS

## 2020-07-18 MED ORDER — DIPHENHYDRAMINE HCL 50 MG/ML IJ SOLN
25.0000 mg | Freq: Once | INTRAMUSCULAR | Status: AC
  Administered 2020-07-18: 25 mg via INTRAVENOUS
  Filled 2020-07-18: qty 1

## 2020-07-18 MED ORDER — DOCUSATE SODIUM 250 MG PO CAPS: 250.0000 mg | ORAL_CAPSULE | Freq: Every day | ORAL | 0 refills | Status: AC

## 2020-07-18 MED ORDER — FERROUS SULFATE 325 (65 FE) MG PO TABS: 325.0000 mg | ORAL_TABLET | Freq: Every day | ORAL | 0 refills | Status: DC

## 2020-07-18 NOTE — ED Provider Notes (Signed)
Patient is a 50 year old female whose care was transferred me at shift change from [facility].  Please see his note below for further detail.  At shift change, patient was receiving 2 units of packed red blood cells and was pending a repeat H&H.  Hemoglobin initially 5.7.  Repeat hemoglobin after blood transfusion was 7.5.  Patient ambulated in the emergency department without difficulty.  No lightheadedness.  Feel that she is stable for discharge at this time and she is agreeable.  She has a follow-up with GI in 2 days.  She was given a referral to oncology by the previous provider.  Recommended that she call them first thing tomorrow morning to schedule follow-up appointment.  We discussed return precautions at length.  Her questions were answered and she was amicable at the time of discharge.   Rayna Sexton, PA-C 07/18/20 [id number]    Hayden Rasmussen, MD 07/19/20 [id number]

## 2020-07-18 NOTE — Discharge Instructions (Signed)
You have microcytic anemia.  I have started you on iron please take as prescribed.  I have also started you on Colace which is a laxative please take as prescribed as iron supplements make you constipated as well turn your stools dark.  I like you to follow-up with oncology for further evaluation of your anemia.  I would also like to keep your appointment with your GI doctor for further evaluation.  Come back to the emergency department if you develop chest pain, shortness of breath, severe abdominal pain, uncontrolled nausea, vomiting, diarrhea.

## 2020-07-18 NOTE — ED Triage Notes (Signed)
Advised by her PCP to come in for evaluation of hemoglobin and also states she has pain in her entire right side from a fall

## 2020-07-18 NOTE — ED Provider Notes (Signed)
[facility] EMERGENCY DEPARTMENT Provider Note   CSN: [id number] Arrival date & time: 07/18/20  5027     History No chief complaint on file.   Brittany Durham is a 50 y.o. female.  HPI   Patient with no significant medical history presents to the emergency department with chief complaint of low hemoglobin.  She endorses that she went to her primary care doctor a few days ago where they drew labs, she was call today and told to that her hemoglobin was 5.8 and she needs a  blood transfusion.  She endorses that she has felt more fatigued and tired lately, she denies shortness of breath, chest pain, lightheadedness or dizziness.  She denies abnormal bruising or bleeding, denies heavy menstrual cycles, dark tarry stools, hematuria or hematemesis, no history of upper or lower GI bleeds.  She has no history of blood transfusion or anemia.  She states she eats a full diet, eats plenty of meats fruits and vegetables.  She has no history of cancer, denies unexplained weight loss or weight gain, night sweats, or bone pain.  Patient denies alleviating factors.  After reviewing patient's chart she had a endoscopy and colonoscopy approximately 3 years ago, colonoscopy shows diverticulosis without diverticulitis, enteroscopy does not show any acute abnormalities.  Patient denies headaches, fevers, chills, shortness of breath, chest pain, abdominal pain, nausea, vomiting, diarrhea, worsening pedal edema.  Past Medical History:  Diagnosis Date   Asthma    childhood   Carotidynia    DDD (degenerative disc disease), cervical    Hypertension    Knee pain    left meniscus tear s/p repair 11/2014   Seizures ([place])    Stuttering    patient reports this started after her knee surgery in 11/2014.    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   Language difficulty 09/25/2016   Nausea with vomiting 07/18/2016   PUD (peptic ulcer disease) 07/18/2016   Slurred speech    GI bleed 05/02/2016   Upper GI bleed 05/02/2016   Sinus tachycardia 05/02/2016   Acute blood loss anemia 05/02/2016   Abdominal pain, epigastric 05/02/2016   Speech disorder 03/31/2015   Thyroid nodule 12/14/2014   Left knee pain 12/14/2014   Seizures ([place])    Altered mental status 12/12/2014   Vitamin D deficiency 06/30/2014   GERD (gastroesophageal reflux disease) 06/29/2014   SYNCOPE 03/02/2010   HYPERTENSION, BENIGN 03/01/2010    Past Surgical History:  Procedure Laterality Date   ANTERIOR CERVICAL DECOMP/DISCECTOMY FUSION N/A 03/20/2019   Procedure: Anterior Cervical Decompression Fusion - Cervical Four-Cervical Five - Cervical Five-Cervical Six;  Surgeon: Eustace Moore, MD;  Location: [place];  Service: Neurosurgery;  Laterality: N/A;  Anterior Cervical Decompression Fusion - Cervical Four-Cervical Five - Cervical Five-Cervical Six   COLONOSCOPY N/A 08/23/2016   Procedure: COLONOSCOPY;  Surgeon: Daneil Dolin, MD;  [facility];  Service: Endoscopy;  Laterality: N/A;  1:00pm   ESOPHAGOGASTRODUODENOSCOPY N/A 05/03/2016   Procedure: ESOPHAGOGASTRODUODENOSCOPY (  EGD);  Surgeon: Daneil Dolin, MD;  Location: [facility];  Service: Endoscopy;  Laterality: N/A;   ESOPHAGOGASTRODUODENOSCOPY N/A 08/23/2016   Procedure: ESOPHAGOGASTRODUODENOSCOPY (EGD);  Surgeon: Daneil Dolin, MD;  Location: [facility];  Service: Endoscopy;  Laterality: N/A;   KNEE  ARTHROSCOPY WITH MEDIAL MENISECTOMY Left 12/09/2014   Procedure: KNEE ARTHROSCOPY WITH PARTIAL MEDIAL AND LATERAL MENISECTOMY;  Surgeon: Sanjuana Kava, MD;  Location: [facility];  Service: Orthopedics;  Laterality: Left;   TUBAL LIGATION     WISDOM TOOTH EXTRACTION       OB History    Gravida  1   Para  1   Term  1   Preterm      AB      Living  1     SAB      IAB      Ectopic      Multiple      Live Births              Family History  Problem Relation Age of Onset   Hypertension Mother    Lung cancer Father    Hypertension Brother    Colon cancer Neg Hx    Gastric cancer Neg Hx    Esophageal cancer Neg Hx     Social History   Tobacco Use   Smoking status: Never Smoker   Smokeless tobacco: Never Used  Vaping Use   Vaping Use: Never used  Substance Use Topics   Alcohol use: No   Drug use: No    Home Medications Prior to Admission medications   Medication Sig Start Date End Date Taking? Authorizing Provider  docusate sodium (COLACE) 250 MG capsule Take 1 capsule (250 mg total) by mouth daily for 10 days. 07/18/20 07/28/20 Yes Marcello Fennel, PA-C  ferrous sulfate 325 (65 FE) MG tablet Take 1 tablet (325 mg total) by mouth daily. 07/18/20  Yes Marcello Fennel, PA-C  amLODipine (NORVASC) 10 MG tablet TAKE 1 TABLET ONCE DAILY 04/01/20   Evelina Dun A, FNP  baclofen (LIORESAL) 10 MG tablet TAKE  (1)  TABLET  THREE TIMES DAILY. 02/23/20   Sharion Balloon, FNP  busPIRone (BUSPAR) 5 MG tablet Take 1 tablet (5 mg total) by mouth 3 (three) times daily. 07/15/20   Ivy Lynn, NP  diclofenac (VOLTAREN) 75 MG EC tablet Take 75 mg by mouth daily. 05/21/19   [provider]  escitalopram (LEXAPRO) 20 MG tablet Take 1 tablet (20 mg total) by mouth daily. 07/15/20   Ivy Lynn, NP  lisinopril (ZESTRIL) 20 MG tablet TAKE 1 TABLET DAILY 04/01/20   Evelina Dun A, FNP  meclizine (ANTIVERT) 25 MG tablet Take 1 tablet (25 mg total) by  mouth 3 (three) times daily as needed for dizziness. 01/15/20   Milton Ferguson, MD  pantoprazole (PROTONIX) 40 MG tablet TAKE 1 TABLET ONCE DAILY BEFORE BREAKFAST 03/23/20   Hawks, Alyse Low A, FNP  Vitamin D, Ergocalciferol, (DRISDOL) 1.25 MG (50000 UNIT) CAPS capsule Take 50,000 Units by mouth once a week. 05/29/19   [provider]    Allergies    Patient has no known allergies.  Review of Systems   Review of Systems  Constitutional: Positive for fatigue. Negative for chills and fever.  HENT: Negative for congestion and sore throat.   Respiratory: Negative for shortness of breath.   Cardiovascular: Negative for chest pain.  Gastrointestinal: Negative for abdominal pain, blood in stool, diarrhea, nausea and vomiting.  Genitourinary: Negative for dysuria, enuresis and hematuria.  Musculoskeletal: Negative for back pain.  Skin: Negative for rash.  Neurological: Negative for headaches.  Hematological: Does not bruise/bleed easily.    Physical Exam Updated Vital Signs BP 132/76    Pulse (!) 102    Temp 98.1 F (36.7 C) (Oral)    Resp 14    Ht 5\' 7"  (1.702 m)    Wt 109.3 kg    SpO2 95%    BMI 37.75 kg/m   Physical Exam Vitals and nursing note reviewed. Exam conducted with a chaperone present.  Constitutional:      General: She is not in acute distress.    Appearance: She is not ill-appearing.  HENT:     Head: Normocephalic and atraumatic.     Nose: No congestion.  Eyes:     Conjunctiva/sclera: Conjunctivae normal.  Cardiovascular:     Rate and Rhythm: Normal rate and regular rhythm.     Pulses: Normal pulses.     Heart sounds: No murmur heard. No friction rub. No gallop.   Pulmonary:     Effort: No respiratory distress.     Breath sounds: No wheezing, rhonchi or rales.  Abdominal:     Palpations: Abdomen is soft.     Tenderness: There is no abdominal tenderness.  Genitourinary:    Rectum: Normal.     Comments: With chaperone present rectal exam was performed, noted  to have external hemorrhoids, no gross blood noted during rectal exam, no masses or other gross abnormalities present. Musculoskeletal:     Right lower leg: No edema.     Left lower leg: No edema.  Skin:    General: Skin is warm and dry.  Neurological:     Mental Status: She is alert.  Psychiatric:        Mood and Affect: Mood normal.     ED Results / Procedures / Treatments   Labs (all labs ordered are listed, but only abnormal results are displayed) Labs Reviewed  CBC - Abnormal; Notable for the following components:      Result Value   RBC 2.99 (*)    Hemoglobin 5.7 (*)    HCT 21.5 (*)    MCV 71.9 (*)    MCH 19.1 (*)    MCHC 26.5 (*)    RDW 18.4 (*)    nRBC 0.3 (*)    All other components within normal limits  POC OCCULT BLOOD, ED - Normal  COMPREHENSIVE METABOLIC PANEL  HEMOGLOBIN AND HEMATOCRIT, BLOOD  TYPE AND SCREEN  PREPARE RBC (CROSSMATCH)    EKG None  Radiology DG Shoulder Right  Result Date: 07/18/2020 CLINICAL DATA:  Status post fall. EXAM: RIGHT SHOULDER - 2+ VIEW COMPARISON:  None. FINDINGS: There is no evidence of fracture or dislocation. There is no evidence of arthropathy or other focal bone abnormality. Soft tissues are unremarkable. IMPRESSION: Negative. Electronically Signed   By: Virgina Norfolk M.D.   On: 07/18/2020 16:03    Procedures .Critical Care Performed by: Marcello Fennel, PA-C Authorized by: Marcello Fennel, PA-C   Critical care provider statement:    Critical care time (minutes):  45   Critical care time was exclusive of:  Separately billable procedures and treating other patients   Critical care was necessary to treat or prevent imminent or life-threatening deterioration of the following conditions:  Circulatory failure   Critical care was time spent personally by me on the following activities:  Discussions with consultants, evaluation of patient's  response to treatment, examination of patient, ordering and performing  treatments and interventions, ordering and review of laboratory studies, ordering and review of radiographic studies, pulse oximetry, re-evaluation of patient's condition and review of old charts   I assumed direction of critical care for this patient from another provider in my specialty: no       Medications Ordered in ED Medications  0.9 %  sodium chloride infusion (0 mL/hr Intravenous Stopped 07/18/20 1527)  diphenhydrAMINE (BENADRYL) injection 25 mg (25 mg Intravenous Given 07/18/20 1608)    ED Course  I have reviewed the triage vital signs and the nursing notes.  Pertinent labs & imaging results that were available during my care of the patient were reviewed by me and considered in my medical decision making (see chart for details).  Clinical Course as of 07/18/20 2127  Mon Jul 18, 2020  1257 Hgb 5.8 [MT]  80 50 yo female w/ hx of peptic ulcer (healed on 2018 EGD), diffuse colonic diverticulosis, chronic stutter, here with concern for anemia.  PCP called and advised coming to ED for anemia treatment. Pt reports feeling generally fatigued for the past month, but lightheadedness and weak in particular the past few days.  She is post-menopausal, no vag bleeding.  No overt GI bleeding.  Benign exam and vital signs.  Her hgb is 5.7 today, microcytic, consistent with bleeding anemia, likely GI source.   BUN normal at 9.  I doubt this is a brisk GI bleed.  They were consented for transfusion and will be given 2 units pRBC over several hours.  If symptomatically improved and stable, we can start her on iron and colace and have her f/u with Dr Sydell Axon, her GI doctor, as scheduled in 2 days in the office.  Return precautions were given.  Pt and daughter in agreement with plan. [MT]    Clinical Course User Index [MT] Trifan, Carola Rhine, MD   MDM Rules/Calculators/A&P                         Initial impression-patient presents with low hemoglobin.  She is alert, does not appear acute distress, vital  signs reassuring.  Will obtain basic lab work-up, perform Hemoccult and reevaluate.  Work-up-CBC shows normocytic anemia with a hemoglobin of 5.7, 6 months ago hemoglobin was 9.2 CMP unremarkable, Hemoccult negative.  Shoulder x-ray negative for acute findings.  Reassessment updated patient on her hemoglobin informed that she should have a blood transfusion at this time.  Patient was given risks and benefits and patient is agreeable for transfusion.  Will repeat H&H after 2 units of transfusion.  Patient is reassessed after providing her with 2 units of blood, she states she is feeling much better, has no complaints at this time.  Vital signs have remained stable.  H&H pending at this time.  Rule out-I have low suspicion for GI bleeding as abdomen soft nontender to palpation, no peritoneal sign, negative Hemoccult.  Patient had an endoscopy and colonoscopy 3 years ago which were benign.  Low suspicion for bleeding disorder as there is no abnormal bleeding or bruising on my exam, patient has no history of this.  Low suspicion for menorrhea as patient denies heavy menstrual cycles.  Low suspicion for cancer as patient has no cancer history, denies unexplained weight loss or weight gain, night sweats, bone pains.  Low suspicion for fracture of the right shoulder as imaging is negative, patient has full range of motion, neurovascular fully  intact.  Low suspicion for CVA or intracranial head bleed as there is no neuro deficit on my exam.  Plan-patient has microcytic anemia with unknown etiology.  will start her on iron as well as Colace for constipation.  Will refer patient to hematology as well as GI for further evaluation.  Due to shift change patient handout to [facility] PA-C he is provided HPI, current work-up, likely disposition.  As long as her H&H is reassuring patient can be discharged home.  Final Clinical Impression(s) / ED Diagnoses Final diagnoses:  Normocytic anemia    Rx / DC  Orders ED Discharge Orders         Ordered    ferrous sulfate 325 (65 FE) MG tablet  Daily        07/18/20 2124    docusate sodium (COLACE) 250 MG capsule  Daily        07/18/20 2124           Marcello Fennel, PA-C 07/18/20 2127    Wyvonnia Dusky, MD 07/19/20 1100

## 2020-07-18 NOTE — ED Notes (Signed)
Pt sleeping. 

## 2020-07-18 NOTE — ED Notes (Signed)
Blood Consent signed.

## 2020-07-18 NOTE — ED Notes (Signed)
Pt complaining of ears itching.  Provider notified

## 2020-07-19 ENCOUNTER — Other Ambulatory Visit: Payer: Self-pay | Admitting: Family

## 2020-07-19 DIAGNOSIS — R0789 Other chest pain: Secondary | ICD-10-CM

## 2020-07-19 DIAGNOSIS — R072 Precordial pain: Secondary | ICD-10-CM

## 2020-07-19 LAB — BPAM RBC
Blood Product Expiration Date: 202204052359
Blood Product Expiration Date: 202204052359
ISSUE DATE / TIME: 202203071434
ISSUE DATE / TIME: 202203071740
Unit Type and Rh: 6200
Unit Type and Rh: 6200

## 2020-07-19 LAB — TYPE AND SCREEN
ABO/RH(D): A POS
Antibody Screen: NEGATIVE
Unit division: 0
Unit division: 0

## 2020-07-20 ENCOUNTER — Other Ambulatory Visit: Payer: Self-pay

## 2020-07-20 ENCOUNTER — Ambulatory Visit (INDEPENDENT_AMBULATORY_CARE_PROVIDER_SITE_OTHER): Payer: Medicare Other | Admitting: Nurse Practitioner

## 2020-07-20 ENCOUNTER — Encounter: Payer: Self-pay | Admitting: Nurse Practitioner

## 2020-07-20 VITALS — BP 116/73 | HR 90 | Temp 97.5°F | Ht 67.0 in | Wt 241.8 lb

## 2020-07-20 DIAGNOSIS — D649 Anemia, unspecified: Secondary | ICD-10-CM | POA: Diagnosis not present

## 2020-07-20 DIAGNOSIS — Z8711 Personal history of peptic ulcer disease: Secondary | ICD-10-CM | POA: Diagnosis not present

## 2020-07-20 DIAGNOSIS — K219 Gastro-esophageal reflux disease without esophagitis: Secondary | ICD-10-CM | POA: Diagnosis not present

## 2020-07-20 NOTE — Progress Notes (Signed)
Referring Provider: Sharion Balloon, FNP Primary Care Physician:  Sharion Balloon, FNP Primary GI:  Dr. Gala Romney  Chief Complaint  Patient presents with  . Anemia    Went to ED Monday with low hemoglobin by PCP. No blood in stools. Advised to take stool softner qd x 10 days and Iron QD. Will see hematology next week    HPI:   Brittany Durham is a 50 y.o. female who presents for 29-month follow-up.  The patient was last seen in our office 12/02/2019 for GERD and diarrhea.  Colonoscopy on 09/30/2016 next due in 2028.  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  Previous episode of diarrhea with liquid stools 3-4 times a day and no overt risks for infection or parasitic issues.  Recommended Imodium 3 times a day, plenty of fluids, probiotic.  This seems to have resolved her symptoms.  At her last visit noted GERD doing well on Protonix without breakthrough or dysphagia.  No other overt GI complaints.  Has never been screened for hepatitis C and agreed to one-time screening as recommended by CDC.  Overall recommend continue Protonix 40 mg daily, hep C antibody test, follow-up in 6 months.  Hepatitis C antibody test was completed 01/14/2020 and found to be negative.  Patient was in the emergency department 01/15/2020 for dizziness.  She was heme-negative on exam.  Hemoglobin low at 9.2 (baseline typically 11.513).  She was normocytic but slightly hyperchromic.  It was noted she has high blood pressure but missed her medication that morning.  She was treated with Antivert and recommended PCP follow-up.  She was again recently in the emergency department 07/18/2020 for complaint of low hemoglobin by primary care.  She was called and told her hemoglobin was 5.8 and she needed a transfusion.  She denied abnormal bruising or bleeding, heavy menses, dark stools, hematuria, hematemesis, hematochezia.  Last EGD and colonoscopy  3 years ago with diverticulosis, no abnormalities on endoscopy.  Rectal exam performed noted to have external hemorrhoids with no gross hematochezia.  Hemoglobin was confirmed at 5.7 (microcytic and hyperchromic).  Apparently heme-negative stool.  It appears she received 2 units PRBCs.  Follow-up hemoglobin 7.5.  Recommend to keep GI follow-up in 2 days, referral initiated for oncology as well. Interestingly, BUN was normal at 9.  She was discharged on Colace and oral iron.  It appears she has an appointment scheduled for 1 to 2 weeks with oncology/hematology.  Today she states she doing okay overall. She feels better after transfusion. She is taking oral iron, just started them yesterday. Denies any hematochezia, melena. Denies GERD symptoms recently. Denies abdominal pain, N/V. Denies any NSAIDs or ASA powders. Denies fever, chills, unintentional weight loss.   Past Medical History:  Diagnosis Date  . Asthma    childhood  . Carotidynia   . COPD (chronic obstructive pulmonary disease) ([place])   . DDD (degenerative disc disease), cervical   . Hypertension   . Knee pain    left meniscus tear s/p repair 11/2014  . Seizures ([name])   . Stuttering    patient reports this started after her knee surgery in 11/2014.   Marland Kitchen Thyroid nodule     Past Surgical History:  Procedure Laterality Date  . ANTERIOR CERVICAL DECOMP/DISCECTOMY FUSION N/A 03/20/2019   Procedure: Anterior Cervical Decompression Fusion - Cervical Four-Cervical Five - Cervical Five-Cervical Six;  Surgeon: Eustace Moore, MD;  Location:  [facility];  Service: Neurosurgery;  Laterality: N/A;  Anterior Cervical Decompression Fusion - Cervical Four-Cervical Five - Cervical Five-Cervical Six  . COLONOSCOPY N/A 08/23/2016   Procedure: COLONOSCOPY;  Surgeon: Daneil Dolin, MD;  Location: [facility];  Service: Endoscopy;  Laterality: N/A;  1:00pm  . ESOPHAGOGASTRODUODENOSCOPY N/A 05/03/2016   Procedure: ESOPHAGOGASTRODUODENOSCOPY (EGD);  Surgeon:  Daneil Dolin, MD;  Location: [facility];  Service: Endoscopy;  Laterality: N/A;  . ESOPHAGOGASTRODUODENOSCOPY N/A 08/23/2016   Procedure: ESOPHAGOGASTRODUODENOSCOPY (EGD);  Surgeon: Daneil Dolin, MD;  Location: [facility];  Service: Endoscopy;  Laterality: N/A;  . KNEE ARTHROSCOPY WITH MEDIAL MENISECTOMY Left 12/09/2014   Procedure: KNEE ARTHROSCOPY WITH PARTIAL MEDIAL AND LATERAL MENISECTOMY;  Surgeon: Sanjuana Kava, MD;  Location: [facility];  Service: Orthopedics;  Laterality: Left;  . TUBAL LIGATION    . WISDOM TOOTH EXTRACTION      Current Outpatient Medications  Medication Sig Dispense Refill  . amLODipine (NORVASC) 10 MG tablet TAKE 1 TABLET ONCE DAILY 90 tablet 1  . baclofen (LIORESAL) 10 MG tablet TAKE  (1)  TABLET  THREE TIMES DAILY. 90 tablet 0  . busPIRone (BUSPAR) 5 MG tablet Take 1 tablet (5 mg total) by mouth 3 (three) times daily. 60 tablet 0  . diclofenac (VOLTAREN) 75 MG EC tablet Take 75 mg by mouth daily.    Marland Kitchen docusate sodium (COLACE) 250 MG capsule Take 1 capsule (250 mg total) by mouth daily for 10 days. 10 capsule 0  . escitalopram (LEXAPRO) 20 MG tablet Take 1 tablet (20 mg total) by mouth daily. 60 tablet 0  . ferrous sulfate 325 (65 FE) MG tablet Take 1 tablet (325 mg total) by mouth daily. 30 tablet 0  . lisinopril (ZESTRIL) 20 MG tablet TAKE 1 TABLET DAILY 90 tablet 1  . meclizine (ANTIVERT) 25 MG tablet Take 1 tablet (25 mg total) by mouth 3 (three) times daily as needed for dizziness. 15 tablet 0  . pantoprazole (PROTONIX) 40 MG tablet TAKE 1 TABLET ONCE DAILY BEFORE BREAKFAST 30 tablet 5  . Vitamin D, Ergocalciferol, (DRISDOL) 1.25 MG (50000 UNIT) CAPS capsule Take 50,000 Units by mouth once a week.     No current facility-administered medications for this visit.    Allergies as of 07/20/2020  . (No Known Allergies)    Family History  Problem Relation Age of Onset  . Hypertension Mother   . Lung cancer Father   . Hypertension Brother   . Colon  cancer Neg Hx   . Gastric cancer Neg Hx   . Esophageal cancer Neg Hx     Social History   Socioeconomic History  . Marital status: Single    Spouse name: Not on file  . Number of children: 1  . Years of education: 79  . Highest education level: Not on file  Occupational History  . Occupation: Umemployed    Comment: Disability  Tobacco Use  . Smoking status: Never Smoker  . Smokeless tobacco: Never Used  Vaping Use  . Vaping Use: Never used  Substance and Sexual Activity  . Alcohol use: No  . Drug use: No  . Sexual activity: Not Currently    Birth control/protection: Surgical  Other Topics Concern  . Not on file  Social History Narrative   Lives at home alone.   Right-handed.   Occasional caffeine use.   Social Determinants of Health   Financial Resource Strain: Not on file  Food Insecurity: Not on file  Transportation Needs: Not on file  Physical Activity: Not on file  Stress: Not on file  Social Connections: Not on file    Subjective: Review of Systems  Constitutional: Negative for chills, fever, malaise/fatigue and weight loss.  HENT: Negative for congestion and sore throat.   Respiratory: Negative for cough and shortness of breath.   Cardiovascular: Negative for chest pain and palpitations.  Gastrointestinal: Negative for abdominal pain, blood in stool, constipation, diarrhea, heartburn, melena, nausea and vomiting.  Musculoskeletal: Negative for joint pain and myalgias.  Skin: Negative for rash.  Neurological: Negative for dizziness and weakness.  Endo/Heme/Allergies: Does not bruise/bleed easily.  Psychiatric/Behavioral: Negative for depression. The patient is not nervous/anxious.   All other systems reviewed and are negative.    Objective: BP 116/73   Pulse 90   Temp (!) 97.5 F (36.4 C)   Ht 5\' 7"  (1.702 m)   Wt 241 lb 12.8 oz (109.7 kg)   BMI 37.87 kg/m  Physical Exam Vitals and nursing note reviewed.  Constitutional:      General: She is  not in acute distress.    Appearance: Normal appearance. She is well-developed. She is obese. She is not ill-appearing, toxic-appearing or diaphoretic.  HENT:     Head: Normocephalic and atraumatic.     Nose: No congestion or rhinorrhea.  Eyes:     General: No scleral icterus. Cardiovascular:     Rate and Rhythm: Normal rate and regular rhythm.     Heart sounds: Normal heart sounds.  Pulmonary:     Effort: Pulmonary effort is normal. No respiratory distress.     Breath sounds: Normal breath sounds.  Abdominal:     General: Bowel sounds are normal.     Palpations: Abdomen is soft. There is no hepatomegaly, splenomegaly or mass.     Tenderness: There is no abdominal tenderness. There is no guarding or rebound.     Hernia: No hernia is present.  Skin:    General: Skin is warm and dry.     Coloration: Skin is not jaundiced.     Findings: No rash.  Neurological:     General: No focal deficit present.     Mental Status: She is alert and oriented to person, place, and time.  Psychiatric:        Attention and Perception: Attention normal.        Mood and Affect: Mood normal.        Speech: Speech normal.        Behavior: Behavior normal.        Thought Content: Thought content normal.        Cognition and Memory: Cognition and memory normal.      Assessment:  Very pleasant 51 year old female with a history of peptic ulcer disease, GERD (well managed on PPI) who was recently in the emergency department for severe symptomatic anemia with a hemoglobin of 5.1.  She received 2 units of PRBCs and was referred back to GI (today) and hematology (upcoming appointment in about 1 to 2 weeks).  Symptomatic anemia: She feels better since transfusion, less dizziness and weakness/fatigue.  She was heme-negative in the emergency department.  No obvious hematochezia or melena.  Likely slow chronic bleed given her microcytic, hypochromic anemia.  She did previously have a drop in hemoglobin in 2017 that  was associated with a nonbleeding, cratered gastric ulcer.  She was on NSAIDs at that time and recommended avoid all NSAIDs and started on PPI.  Follow-up EGD 3  months later showed complete healing.  Currently she is not on any NSAIDs or aspirin powders.  She remains on PPI.  Last EGD colonoscopy in 2018, as outlined above.  Given the dramatic drop in her hemoglobin I feel it is best practice to repeat her colonoscopy and endoscopy as well as plan for possible capsule placement to try to wrap up her GI evaluation.  GERD: Currently asymptomatic, no worsening abdominal pain, reflux symptoms.  Recommend she continue her PPI   Proceed with colonoscopy and EGD +/- Givens capsule with Dr. Gala Romney in near future: the risks, benefits, and alternatives have been discussed with the patient in detail. The patient states understanding and desires to proceed.  Patient is currently on iron which we will hold for 1 week prior to her procedure. The patient is not on any other anticoagulants, anxiolytics, chronic pain medications, antidepressants, antidiabetics, or iron supplements.  Conscious sedation should be adequate for her procedure as it was for last.    Plan: 1. Colonoscopy and endoscopy of possible Givens capsule as outlined above 2. Call if any worsening symptoms of anemia 3. Keep follow-up appointment with hematology 4. Follow-up in 3 months    Thank you for allowing [place] to participate in the care of [place], DNP, AGNP-C Adult & Gerontological Nurse Practitioner [facility]   07/20/2020 4:29 PM   Disclaimer: This note was dictated with voice recognition software. Similar sounding words can inadvertently be transcribed and may not be corrected upon review.

## 2020-07-20 NOTE — Progress Notes (Signed)
Cc'ed to pcp °

## 2020-07-20 NOTE — Patient Instructions (Signed)
Your health issues we discussed today were:   Anemia (blood loss) with a history of stomach ulcers: 1. At this point, as we discussed, I feel it is best to repeat your colonoscopy and endoscopy at this time to evaluate for possible causes of bleeding 2. We will also possibly place a Givens capsule (pill with a camera to look at the small intestines as well) 3. Further recommendations will follow your procedures 4. Call us if you have any worsening symptoms such as worsening weakness, fatigue, dizziness, passing out, nearly passing out 5. Keep your follow-up appointment with hematology in the next 1 to 2 weeks  GERD (heartburn/reflux): 1. I am glad you are not having any symptoms 2. Continue taking Protonix once a day as you have been 3. Call us for any worsening heartburn symptoms  Overall I recommend:  1. Continue other current medications 2. Return for follow-up in 3 months 3. Call us for any questions or concerns   ---------------------------------------------------------------  I am glad you have gotten your COVID-19 vaccination!  Even though you are fully vaccinated you should continue to follow CDC and state/local guidelines.  ---------------------------------------------------------------   At [facility] we value your feedback. You may receive a survey about your visit today. Please share your experience as we strive to create trusting relationships with our patients to provide genuine, compassionate, quality care.  We appreciate your understanding and patience as we review any laboratory studies, imaging, and other diagnostic tests that are ordered as we care for you. Our office policy is 5 business days for review of these results, and any emergent or urgent results are addressed in a timely manner for your best interest. If you do not hear from our office in 1 week, please contact us.   We also encourage the use of MyChart, which contains your medical  information for your review as well. If you are not enrolled in this feature, an access code is on this after visit summary for your convenience. Thank you for allowing [place] to be involved in your care.  It was great to see you today!  I hope you have a great spring!!

## 2020-07-21 ENCOUNTER — Other Ambulatory Visit: Payer: Self-pay | Admitting: *Deleted

## 2020-07-21 ENCOUNTER — Encounter: Payer: Self-pay | Admitting: *Deleted

## 2020-07-21 MED ORDER — CLENPIQ 10-3.5-12 MG-GM -GM/160ML PO SOLN: 1.0000 | Freq: Once | ORAL | 0 refills | Status: AC

## 2020-07-25 ENCOUNTER — Other Ambulatory Visit: Payer: Self-pay | Admitting: Family

## 2020-07-29 ENCOUNTER — Inpatient Hospital Stay (HOSPITAL_COMMUNITY): Payer: Medicare Other

## 2020-07-29 ENCOUNTER — Inpatient Hospital Stay (HOSPITAL_COMMUNITY): Payer: Medicare Other | Attending: Hematology and Oncology | Admitting: Hematology and Oncology

## 2020-07-29 ENCOUNTER — Encounter (HOSPITAL_COMMUNITY): Payer: Self-pay | Admitting: Hematology and Oncology

## 2020-07-29 ENCOUNTER — Other Ambulatory Visit: Payer: Self-pay | Admitting: Hematology and Oncology

## 2020-07-29 ENCOUNTER — Other Ambulatory Visit: Payer: Self-pay

## 2020-07-29 DIAGNOSIS — Z8711 Personal history of peptic ulcer disease: Secondary | ICD-10-CM

## 2020-07-29 DIAGNOSIS — D509 Iron deficiency anemia, unspecified: Secondary | ICD-10-CM | POA: Insufficient documentation

## 2020-07-29 DIAGNOSIS — Z801 Family history of malignant neoplasm of trachea, bronchus and lung: Secondary | ICD-10-CM | POA: Diagnosis not present

## 2020-07-29 DIAGNOSIS — D649 Anemia, unspecified: Secondary | ICD-10-CM

## 2020-07-29 LAB — COMPREHENSIVE METABOLIC PANEL
ALT: 15 U/L (ref 0–44)
AST: 15 U/L (ref 15–41)
Albumin: 3.9 g/dL (ref 3.5–5.0)
Alkaline Phosphatase: 102 U/L (ref 38–126)
Anion gap: 8 (ref 5–15)
BUN: 10 mg/dL (ref 6–20)
CO2: 27 mmol/L (ref 22–32)
Calcium: 9.4 mg/dL (ref 8.9–10.3)
Chloride: 102 mmol/L (ref 98–111)
Creatinine, Ser: 0.81 mg/dL (ref 0.44–1.00)
GFR, Estimated: >60 mL/min (ref >60)
Glucose, Bld: 92 mg/dL (ref 70–99)
Potassium: 4 mmol/L (ref 3.5–5.1)
Sodium: 137 mmol/L (ref 135–145)
Total Bilirubin: 0.5 mg/dL (ref 0.3–1.2)
Total Protein: 7.8 g/dL (ref 6.5–8.1)

## 2020-07-29 LAB — CBC WITH DIFFERENTIAL/PLATELET
Abs Immature Granulocytes: 0.04 10*3/uL (ref 0.00–0.07)
Basophils Absolute: 0 10*3/uL (ref 0.0–0.1)
Basophils Relative: 0 %
Eosinophils Absolute: 0.1 10*3/uL (ref 0.0–0.5)
Eosinophils Relative: 1 %
HCT: 29.8 % — ABNORMAL LOW (ref 36.0–46.0)
Hemoglobin: 8.5 g/dL — ABNORMAL LOW (ref 12.0–15.0)
Immature Granulocytes: 0 %
Lymphocytes Relative: 20 %
Lymphs Abs: 1.8 10*3/uL (ref 0.7–4.0)
MCH: 22.3 pg — ABNORMAL LOW (ref 26.0–34.0)
MCHC: 28.5 g/dL — ABNORMAL LOW (ref 30.0–36.0)
MCV: 78.2 fL — ABNORMAL LOW (ref 80.0–100.0)
Monocytes Absolute: 0.7 10*3/uL (ref 0.1–1.0)
Monocytes Relative: 7 %
Neutro Abs: 6.5 10*3/uL (ref 1.7–7.7)
Neutrophils Relative %: 72 %
Platelets: 339 10*3/uL (ref 150–400)
RBC: 3.81 MIL/uL — ABNORMAL LOW (ref 3.87–5.11)
RDW: 23.1 % — ABNORMAL HIGH (ref 11.5–15.5)
WBC: 9.2 10*3/uL (ref 4.0–10.5)
nRBC: 0 % (ref 0.0–0.2)

## 2020-07-29 LAB — RETICULOCYTES
Immature Retic Fract: 40.2 % — ABNORMAL HIGH (ref 2.3–15.9)
RBC.: 3.88 MIL/uL (ref 3.87–5.11)
Retic Count, Absolute: 133.1 10*3/uL (ref 19.0–186.0)
Retic Ct Pct: 3.4 % — ABNORMAL HIGH (ref 0.4–3.1)

## 2020-07-29 LAB — IRON AND TIBC
Iron: 169 ug/dL (ref 28–170)
Saturation Ratios: 32 % — ABNORMAL HIGH (ref 10.4–31.8)
TIBC: 526 ug/dL — ABNORMAL HIGH (ref 250–450)
UIBC: 357 ug/dL

## 2020-07-29 LAB — LACTATE DEHYDROGENASE: LDH: 154 U/L (ref 98–192)

## 2020-07-29 LAB — FERRITIN: Ferritin: 7 ng/mL — ABNORMAL LOW (ref 11–307)

## 2020-07-29 NOTE — Progress Notes (Signed)
[facility] CONSULT NOTE  Patient Care Team: Sharion Balloon, FNP as PCP - General (Nurse Practitioner) Buford Dresser, MD as PCP - Cardiology (Cardiology) Gala Romney Cristopher Estimable, MD as Consulting Physician (Gastroenterology)  CHIEF COMPLAINTS/PURPOSE OF CONSULTATION:  Severe symptomatic anemia  ASSESSMENT & PLAN:   Symptomatic anemia Severe iron deficiency anemia, likely related to GI blood loss.  She was very symptomatic and hence required 2 units of blood transfusion in the emergency room.  Iron panel and ferritin with ferritin of 3 consistent with iron deficiency. We have discussed about considering intravenous iron infusion given her severe anemia along with gastroenterology evaluation for the cause of anemia. I discussed about IV iron, common side effects with IV iron including but not limited to fatigue, myalgias, headaches, flulike symptoms, severe allergy and less than 1% of the patients and some electrolyte disturbances have been documented with certain iron formularies. She is agreeable to considering intravenous iron for rapid improvement of her anemia if labs from today continue to show severe iron deficiency. We will draw some baseline CBC, smear review, CMP, LDH, reticulocyte count, Z61 levels, folic acid levels to rule out other possibilities for anemia. Daughter also mentioned that she followed up with Dr. Sydell Axon, her gastroenterologist, had an endoscopy and colonoscopy which showed no evidence of significant bleeding. She is scheduled to have her capsule endoscopy on April 13, I agree with the capsule endoscopy evaluation.  Again I have discussed that she will have to return to clinic in about 6 to 8 weeks for follow-up with repeat labs.    Patient and daughter expressed understanding.  History of peptic ulcer disease Given sudden onset of severe anemia, I believe she will need another upper GI endoscopy evaluation to reevaluate for possible causes of upper  GI bleed.  It is up to the discretion of gastroenterology if she would benefit from colonoscopy as well. I have encouraged her to discuss with her gastroenterologist.  Orders Placed This Encounter  Procedures  . CBC with Differential/Platelet    Standing Status:   Standing    Number of Occurrences:   22    Standing Expiration Date:   07/29/2021  . Iron and TIBC    Standing Status:   Future    Standing Expiration Date:   07/29/2021  . Ferritin    Standing Status:   Future    Standing Expiration Date:   07/29/2021  . Comprehensive metabolic panel    Standing Status:   Standing    Number of Occurrences:   33    Standing Expiration Date:   07/29/2021  . Lactate dehydrogenase    Standing Status:   Future    Standing Expiration Date:   07/29/2021  . Reticulocytes    Standing Status:   Future    Standing Expiration Date:   07/29/2021   HISTORY OF PRESENTING ILLNESS:  Brittany Durham 50 y.o. female is here because of severe anemia  50 yo female w/ hx of peptic ulcer (healed on 2018 EGD), diffuse colonic diverticulosis, chronic stutter, recently seen in the ED with anemia.  Pt reports feeling generally fatigued for the past month, but lightheadedness and weak in particular the past few days.  She is post-menopausal, no vag bleeding.  No overt GI bleeding.  Benign exam and vital signs.  Her hgb was 5.7 at the time of ED visit, microcytic, consistent with bleeding anemia, likely GI source.  She received 2 units of packed red blood cells in the emergency room  and was recommended to follow-up with her gastroenterologist and hematology for assistance in anemia management.  She has been feeling well since her blood transfusion. Prior to blood transfusion she was feeling very tired, was sleeping all the time but she feels much better now. She denies any change in her bowel habits except for some diarrhea from a stool softener.  She has not noted any hematochezia or melena.  She has followed up with her  gastroenterologist, had an upper GI endoscopy and colonoscopy, according to patient's verbal report, there have not been any significant findings.  She is scheduled to do a capsule endoscopy on the 13th.  She is now taking some iron tablets and stool softeners since the ED visit. Rest of the pertinent review of systems reviewed and negative.  REVIEW OF SYSTEMS:   Constitutional: Denies fevers, chills or abnormal night sweats Eyes: Denies blurriness of vision, double vision or watery eyes Ears, nose, mouth, throat, and face: Denies mucositis or sore throat Respiratory: Denies cough, dyspnea or wheezes Cardiovascular: Denies palpitation, chest discomfort or lower extremity swelling Gastrointestinal:  Denies nausea, heartburn or change in bowel habits Skin: Denies abnormal skin rashes Lymphatics: Denies new lymphadenopathy or easy bruising Neurological:Denies numbness, tingling or new weaknesses Behavioral/Psych: Mood is stable, no new changes  All other systems were reviewed with the patient and are negative.  MEDICAL HISTORY:  Past Medical History:  Diagnosis Date  . Asthma    childhood  . Carotidynia   . COPD (chronic obstructive pulmonary disease) ([place])   . DDD (degenerative disc disease), cervical   . Hypertension   . Knee pain    left meniscus tear s/p repair 11/2014  . Seizures ([place])   . Stuttering    patient reports this started after her knee surgery in 11/2014.   Marland Kitchen Thyroid nodule     SURGICAL HISTORY: Past Surgical History:  Procedure Laterality Date  . ANTERIOR CERVICAL DECOMP/DISCECTOMY FUSION N/A 03/20/2019   Procedure: Anterior Cervical Decompression Fusion - Cervical Four-Cervical Five - Cervical Five-Cervical Six;  Surgeon: Eustace Moore, MD;  Location: [facility];  Service: Neurosurgery;  Laterality: N/A;  Anterior Cervical Decompression Fusion - Cervical Four-Cervical Five - Cervical Five-Cervical Six  . COLONOSCOPY N/A 08/23/2016   Procedure: COLONOSCOPY;  Surgeon:  Daneil Dolin, MD;  [facility];  Service: Endoscopy;  Laterality: N/A;  1:00pm  . ESOPHAGOGASTRODUODENOSCOPY N/A 05/03/2016   Procedure: ESOPHAGOGASTRODUODENOSCOPY (EGD);  Surgeon: Daneil Dolin, MD;  [facility];  Service: Endoscopy;  Laterality: N/A;  . ESOPHAGOGASTRODUODENOSCOPY N/A 08/23/2016   Procedure: ESOPHAGOGASTRODUODENOSCOPY (EGD);  Surgeon: Daneil Dolin, MD;  [facility];  Service: Endoscopy;  Laterality: N/A;  . KNEE ARTHROSCOPY WITH MEDIAL MENISECTOMY Left 12/09/2014   Procedure: KNEE ARTHROSCOPY WITH PARTIAL MEDIAL AND LATERAL MENISECTOMY;  Surgeon: Sanjuana Kava, MD;  Location: [facility];  Service: Orthopedics;  Laterality: Left;  . TUBAL LIGATION    . WISDOM TOOTH EXTRACTION      SOCIAL HISTORY: Social History   Socioeconomic History  . Marital status: Single    Spouse name: Not on file  . Number of children: 1  . Years of education: 72  . Highest education level: Not on file  Occupational History  . Occupation: Umemployed    Comment: Disability  Tobacco Use  . Smoking status: Never Smoker  . Smokeless tobacco: Never Used  Vaping Use  . Vaping Use: Never used  Substance and Sexual Activity  . Alcohol use: No  .  Drug use: No  . Sexual activity: Not Currently    Birth control/protection: Surgical  Other Topics Concern  . Not on file  Social History Narrative   Lives at home alone.   Right-handed.   Occasional caffeine use.   Social Determinants of Health   Financial Resource Strain: Not on file  Food Insecurity: Not on file  Transportation Needs: Not on file  Physical Activity: Not on file  Stress: Not on file  Social Connections: Not on file  Intimate Partner Violence: Not on file    FAMILY HISTORY: Family History  Problem Relation Age of Onset  . Hypertension Mother   . Lung cancer Father   . Hypertension Brother   . Colon cancer Neg Hx   . Gastric cancer Neg Hx   . Esophageal cancer Neg Hx      ALLERGIES:  has No Known Allergies.  MEDICATIONS:  Current Outpatient Medications  Medication Sig Dispense Refill  . amLODipine (NORVASC) 10 MG tablet TAKE 1 TABLET ONCE DAILY 90 tablet 1  . baclofen (LIORESAL) 10 MG tablet TAKE  (1)  TABLET  THREE TIMES DAILY. 90 tablet 0  . busPIRone (BUSPAR) 5 MG tablet Take 1 tablet (5 mg total) by mouth 3 (three) times daily. 60 tablet 0  . CLENPIQ 10-3.5-12 MG-GM -GM/160ML SOLN SMARTSIG:320 Milliliter(s) By Mouth As Directed    . diclofenac (VOLTAREN) 75 MG EC tablet Take 75 mg by mouth daily.    Marland Kitchen escitalopram (LEXAPRO) 20 MG tablet Take 1 tablet (20 mg total) by mouth daily. 60 tablet 0  . ferrous sulfate 325 (65 FE) MG tablet Take 1 tablet (325 mg total) by mouth daily. 30 tablet 0  . lisinopril (ZESTRIL) 20 MG tablet TAKE 1 TABLET DAILY 90 tablet 1  . meclizine (ANTIVERT) 25 MG tablet Take 1 tablet (25 mg total) by mouth 3 (three) times daily as needed for dizziness. 15 tablet 0  . pantoprazole (PROTONIX) 40 MG tablet TAKE 1 TABLET ONCE DAILY BEFORE BREAKFAST 30 tablet 5  . Vitamin D, Ergocalciferol, (DRISDOL) 1.25 MG (50000 UNIT) CAPS capsule Take 50,000 Units by mouth once a week.     No current facility-administered medications for this visit.     PHYSICAL EXAMINATION:  ECOG PERFORMANCE STATUS: 1 - Symptomatic but completely ambulatory  Vitals:   07/29/20 1058  BP: 138/76  Pulse: 100  Resp: 18  Temp: 98 F (36.7 C)  SpO2: 97%   Filed Weights   07/29/20 1058  Weight: 239 lb 6.4 oz (108.6 kg)    GENERAL:alert, no distress and comfortable SKIN: skin color, texture, turgor are normal, no rashes or significant lesions EYES: normal, conjunctiva are pink and non-injected, sclera clear OROPHARYNX:no exudate, no erythema and lips, buccal mucosa, and tongue normal  NECK: supple, thyroid normal size, non-tender, without nodularity LYMPH:  no palpable lymphadenopathy in the cervical, axillary or inguinal LUNGS: clear to  auscultation and percussion with normal breathing effort HEART: regular rate & rhythm and no murmurs and no lower extremity edema ABDOMEN:abdomen soft, non-tender and normal bowel sounds Musculoskeletal:no cyanosis of digits and no clubbing  PSYCH: alert & oriented x 3 with fluent speech NEURO: no focal motor/sensory deficits  LABORATORY DATA:  I have reviewed the data as listed Lab Results  Component Value Date   WBC 8.0 07/18/2020   HGB 7.5 (L) 07/18/2020   HCT 25.5 (L) 07/18/2020   MCV 71.9 (L) 07/18/2020   PLT 288 07/18/2020     Chemistry  Component Value Date/Time   NA 136 07/18/2020 1048   NA 142 08/27/2019 0920   K 3.9 07/18/2020 1048   CL 102 07/18/2020 1048   CO2 26 07/18/2020 1048   BUN 9 07/18/2020 1048   BUN 8 08/27/2019 0920   CREATININE 0.71 07/18/2020 1048      Component Value Date/Time   CALCIUM 9.1 07/18/2020 1048   ALKPHOS 93 07/18/2020 1048   AST 16 07/18/2020 1048   ALT 15 07/18/2020 1048   BILITOT 0.4 07/18/2020 1048   BILITOT 0.4 08/27/2019 0920     I have reviewed medical records from ED.  RADIOGRAPHIC STUDIES: I have personally reviewed the radiological images as listed and agreed with the findings in the report. DG Shoulder Right  Result Date: 07/18/2020 CLINICAL DATA:  Status post fall. EXAM: RIGHT SHOULDER - 2+ VIEW COMPARISON:  None. FINDINGS: There is no evidence of fracture or dislocation. There is no evidence of arthropathy or other focal bone abnormality. Soft tissues are unremarkable. IMPRESSION: Negative. Electronically Signed   By: Virgina Norfolk M.D.   On: 07/18/2020 16:03    All questions were answered. The patient knows to call the clinic with any problems, questions or concerns. I spent 45 minutes in the care of this patient including H and P, review of records, counseling and coordination of care.     Benay Pike, MD 07/29/2020 11:26 AM

## 2020-07-29 NOTE — Assessment & Plan Note (Addendum)
Severe iron deficiency anemia, likely related to GI blood loss.  She was very symptomatic and hence required 2 units of blood transfusion in the emergency room.  Iron panel and ferritin with ferritin of 3 consistent with iron deficiency. We have discussed about considering intravenous iron infusion given her severe anemia along with gastroenterology evaluation for the cause of anemia. I discussed about IV iron, common side effects with IV iron including but not limited to fatigue, myalgias, headaches, flulike symptoms, severe allergy and less than 1% of the patients and some electrolyte disturbances have been documented with certain iron formularies. She is agreeable to considering intravenous iron for rapid improvement of her anemia if labs from today continue to show severe iron deficiency. We will draw some baseline CBC, smear review, CMP, LDH, reticulocyte count, O84 levels, folic acid levels to rule out other possibilities for anemia. Daughter also mentioned that she followed up with Dr. Sydell Axon, her gastroenterologist, had an endoscopy and colonoscopy which showed no evidence of significant bleeding. She is scheduled to have her capsule endoscopy on April 13, I agree with the capsule endoscopy evaluation.  Again I have discussed that she will have to return to clinic in about 6 to 8 weeks for follow-up with repeat labs.    Patient and daughter expressed understanding.

## 2020-07-29 NOTE — Assessment & Plan Note (Addendum)
Given sudden onset of severe anemia, according to daughter, she underwent endoscopy and colonoscopy which did not show any clear evidence of bleeding. She will proceed with capsule endoscopy evaluation, scheduled on April 13. We will try to obtain records from Dr. Orvan Falconer office.

## 2020-08-02 ENCOUNTER — Inpatient Hospital Stay (HOSPITAL_COMMUNITY): Payer: Medicare Other

## 2020-08-02 VITALS — BP 115/71 | HR 97 | Temp 97.0°F | Resp 18

## 2020-08-02 DIAGNOSIS — D649 Anemia, unspecified: Secondary | ICD-10-CM | POA: Diagnosis not present

## 2020-08-02 DIAGNOSIS — D62 Acute posthemorrhagic anemia: Secondary | ICD-10-CM

## 2020-08-02 DIAGNOSIS — Z801 Family history of malignant neoplasm of trachea, bronchus and lung: Secondary | ICD-10-CM | POA: Diagnosis not present

## 2020-08-02 DIAGNOSIS — Z8711 Personal history of peptic ulcer disease: Secondary | ICD-10-CM | POA: Diagnosis not present

## 2020-08-02 DIAGNOSIS — D509 Iron deficiency anemia, unspecified: Secondary | ICD-10-CM | POA: Diagnosis not present

## 2020-08-02 MED ORDER — SODIUM CHLORIDE 0.9 % IV SOLN: Freq: Once | INTRAVENOUS | Status: AC

## 2020-08-02 MED ORDER — SODIUM CHLORIDE 0.9 % IV SOLN
200.0000 mg | Freq: Once | INTRAVENOUS | Status: AC
  Administered 2020-08-02: 200 mg via INTRAVENOUS
  Filled 2020-08-02: qty 200

## 2020-08-02 NOTE — Patient Instructions (Signed)

## 2020-08-02 NOTE — Progress Notes (Signed)
Brittany Durham presents today for IV Venofer infusion per MDs orders in supportive therapy plan. Vitals have been reviewed and are stable and within parameters for infusion today. Infusion given without incident or complaint. Patient observed for 30 minutes post infusion per orders. VSS upon completion of infusion, IV flushed and removed per protocol, see MAR and IV flowsheet for details. Discharged in satisfactory condition with follow up instructions.

## 2020-08-04 ENCOUNTER — Other Ambulatory Visit: Payer: Self-pay

## 2020-08-04 ENCOUNTER — Inpatient Hospital Stay (HOSPITAL_COMMUNITY): Payer: Medicare Other

## 2020-08-04 VITALS — BP 129/71 | HR 93 | Temp 97.2°F | Resp 18

## 2020-08-04 DIAGNOSIS — D509 Iron deficiency anemia, unspecified: Secondary | ICD-10-CM | POA: Diagnosis not present

## 2020-08-04 DIAGNOSIS — Z801 Family history of malignant neoplasm of trachea, bronchus and lung: Secondary | ICD-10-CM | POA: Diagnosis not present

## 2020-08-04 DIAGNOSIS — D62 Acute posthemorrhagic anemia: Secondary | ICD-10-CM

## 2020-08-04 DIAGNOSIS — Z8711 Personal history of peptic ulcer disease: Secondary | ICD-10-CM | POA: Diagnosis not present

## 2020-08-04 DIAGNOSIS — D649 Anemia, unspecified: Secondary | ICD-10-CM | POA: Diagnosis not present

## 2020-08-04 MED ORDER — SODIUM CHLORIDE 0.9 % IV SOLN
200.0000 mg | Freq: Once | INTRAVENOUS | Status: AC
  Administered 2020-08-04: 200 mg via INTRAVENOUS
  Filled 2020-08-04: qty 200

## 2020-08-04 MED ORDER — SODIUM CHLORIDE 0.9 % IV SOLN: Freq: Once | INTRAVENOUS | Status: AC

## 2020-08-04 NOTE — Patient Instructions (Signed)
Iron Sucrose infusion What is this medicine? IRON SUCROSE (AHY ern SOO krohs) is an iron complex. Iron is used to make healthy red blood cells, which carry oxygen and nutrients throughout the body. This medicine is used to treat iron deficiency anemia in people with chronic kidney disease. This medicine may be used for other purposes; ask your health care provider or pharmacist if you have questions. COMMON BRAND NAME(S): Venofer What should I tell my health care provider before I take this medicine? They need to know if you have any of these conditions:  anemia not caused by low iron levels  heart disease  high levels of iron in the blood  kidney disease  liver disease  an unusual or allergic reaction to iron, other medicines, foods, dyes, or preservatives  pregnant or trying to get pregnant  breast-feeding How should I use this medicine? This medicine is for infusion into a vein. It is given by a health care professional in a hospital or clinic setting. Talk to your pediatrician regarding the use of this medicine in children. While this drug may be prescribed for children as young as 2 years for selected conditions, precautions do apply. Overdosage: If you think you have taken too much of this medicine contact a poison control center or emergency room at once. NOTE: This medicine is only for you. Do not share this medicine with others. What if I miss a dose? It is important not to miss your dose. Call your doctor or health care professional if you are unable to keep an appointment. What may interact with this medicine? Do not take this medicine with any of the following medications:  deferoxamine  dimercaprol  other iron products This medicine may also interact with the following medications:  chloramphenicol  deferasirox This list may not describe all possible interactions. Give your health care provider a list of all the medicines, herbs, non-prescription drugs, or  dietary supplements you use. Also tell them if you smoke, drink alcohol, or use illegal drugs. Some items may interact with your medicine. What should I watch for while using this medicine? Visit your doctor or healthcare professional regularly. Tell your doctor or healthcare professional if your symptoms do not start to get better or if they get worse. You may need blood work done while you are taking this medicine. You may need to follow a special diet. Talk to your doctor. Foods that contain iron include: whole grains/cereals, dried fruits, beans, or peas, leafy green vegetables, and organ meats (liver, kidney). What side effects may I notice from receiving this medicine? Side effects that you should report to your doctor or health care professional as soon as possible:  allergic reactions like skin rash, itching or hives, swelling of the face, lips, or tongue  breathing problems  changes in blood pressure  cough  fast, irregular heartbeat  feeling faint or lightheaded, falls  fever or chills  flushing, sweating, or hot feelings  joint or muscle aches/pains  seizures  swelling of the ankles or feet  unusually weak or tired Side effects that usually do not require medical attention (report to your doctor or health care professional if they continue or are bothersome):  diarrhea  feeling achy  headache  irritation at site where injected  nausea, vomiting  stomach upset  tiredness This list may not describe all possible side effects. Call your doctor for medical advice about side effects. You may report side effects to FDA at 1-800-FDA-1088. Where should I keep   my medicine? This drug is given in a hospital or clinic and will not be stored at home. NOTE: This sheet is a summary. It may not cover all possible information. If you have questions about this medicine, talk to your doctor, pharmacist, or health care provider.  2021 Elsevier/Gold Standard (2011-02-08  17:14:35)  

## 2020-08-04 NOTE — Progress Notes (Signed)
Patient presents today for Venofer infusion.  Vital signs WNL.  Patient has no new complaints since last visit.  Venofer given today per MD orders.  Stable during  infusion without adverse affects.  Peripheral IV started and blood return noted before and after infusion.  Vital signs stable.  No complaints at this time.  Discharge from clinic ambulatory in stable condition.  Alert and oriented X 3.  Follow up with [facility] as scheduled.

## 2020-08-07 ENCOUNTER — Ambulatory Visit
Admit: 2020-08-07 | Discharge: 2020-08-07 | Disposition: A | Discharge status: Home / Self Care | Payer: Medicare Other | Attending: Student | Admitting: Student

## 2020-08-07 ENCOUNTER — Other Ambulatory Visit: Payer: Self-pay

## 2020-08-07 DIAGNOSIS — R200 Anesthesia of skin: Secondary | ICD-10-CM | POA: Diagnosis not present

## 2020-08-07 DIAGNOSIS — R531 Weakness: Secondary | ICD-10-CM | POA: Diagnosis not present

## 2020-08-07 DIAGNOSIS — M5023 Other cervical disc displacement, cervicothoracic region: Secondary | ICD-10-CM | POA: Diagnosis not present

## 2020-08-07 DIAGNOSIS — M4802 Spinal stenosis, cervical region: Secondary | ICD-10-CM | POA: Diagnosis not present

## 2020-08-07 DIAGNOSIS — M542 Cervicalgia: Secondary | ICD-10-CM

## 2020-08-09 ENCOUNTER — Other Ambulatory Visit (HOSPITAL_COMMUNITY): Payer: Self-pay | Admitting: Physician Assistant

## 2020-08-09 ENCOUNTER — Inpatient Hospital Stay (HOSPITAL_COMMUNITY): Payer: Medicare Other

## 2020-08-09 ENCOUNTER — Other Ambulatory Visit: Payer: Self-pay

## 2020-08-09 VITALS — BP 127/78 | HR 73 | Temp 97.2°F | Resp 18

## 2020-08-09 DIAGNOSIS — Z8711 Personal history of peptic ulcer disease: Secondary | ICD-10-CM | POA: Diagnosis not present

## 2020-08-09 DIAGNOSIS — D62 Acute posthemorrhagic anemia: Secondary | ICD-10-CM

## 2020-08-09 DIAGNOSIS — D509 Iron deficiency anemia, unspecified: Secondary | ICD-10-CM | POA: Diagnosis not present

## 2020-08-09 DIAGNOSIS — Z801 Family history of malignant neoplasm of trachea, bronchus and lung: Secondary | ICD-10-CM | POA: Diagnosis not present

## 2020-08-09 DIAGNOSIS — D649 Anemia, unspecified: Secondary | ICD-10-CM

## 2020-08-09 MED ORDER — SODIUM CHLORIDE 0.9 % IV SOLN
200.0000 mg | Freq: Once | INTRAVENOUS | Status: AC
  Administered 2020-08-09: 200 mg via INTRAVENOUS
  Filled 2020-08-09: qty 200

## 2020-08-09 MED ORDER — SODIUM CHLORIDE 0.9 % IV SOLN: Freq: Once | INTRAVENOUS | Status: AC

## 2020-08-09 NOTE — Patient Instructions (Signed)
[place] at [facility]  Discharge Instructions:  Iron transfusion today.  Follow up as scheduled.  Please call the clinic if you have any questions or concerns.  _______________________________________________________________  Thank you for choosing [place] at [facility] to provide your oncology and hematology care.  To afford each patient quality time with our providers, please arrive at least 15 minutes before your scheduled appointment.  You need to re-schedule your appointment if you arrive 10 or more minutes late.  We strive to give you quality time with our providers, and arriving late affects you and other patients whose appointments are after yours.  Also, if you no show three or more times for appointments you may be dismissed from the clinic.  Again, thank you for choosing [facility] [place] hope is that these requests will allow you access to exceptional care and in a timely manner. _______________________________________________________________  If you have questions after your visit, please contact our office at [phone] between the hours of 8:30 a.m. and 5:00 p.m. Voicemails left after 4:30 p.m. will not be returned until the following business day. _______________________________________________________________  For prescription refill requests, have your pharmacy contact our office. _______________________________________________________________  Recommendations made by the consultant and any test results will be sent to your referring physician. _______________________________________________________________

## 2020-08-09 NOTE — Progress Notes (Signed)
Pt tolerated treatment without incidence today and discharged in stable condition ambulatory.  Vital signs stable prior to discharge.  AVS reviewed.

## 2020-08-11 ENCOUNTER — Inpatient Hospital Stay (HOSPITAL_COMMUNITY): Payer: Medicare Other

## 2020-08-11 VITALS — BP 98/65 | HR 90 | Temp 97.1°F | Resp 18

## 2020-08-11 DIAGNOSIS — D509 Iron deficiency anemia, unspecified: Secondary | ICD-10-CM | POA: Diagnosis not present

## 2020-08-11 DIAGNOSIS — Z801 Family history of malignant neoplasm of trachea, bronchus and lung: Secondary | ICD-10-CM | POA: Diagnosis not present

## 2020-08-11 DIAGNOSIS — Z8711 Personal history of peptic ulcer disease: Secondary | ICD-10-CM | POA: Diagnosis not present

## 2020-08-11 DIAGNOSIS — D649 Anemia, unspecified: Secondary | ICD-10-CM | POA: Diagnosis not present

## 2020-08-11 DIAGNOSIS — D62 Acute posthemorrhagic anemia: Secondary | ICD-10-CM

## 2020-08-11 MED ORDER — SODIUM CHLORIDE 0.9 % IV SOLN
200.0000 mg | Freq: Once | INTRAVENOUS | Status: AC
  Administered 2020-08-11: 200 mg via INTRAVENOUS
  Filled 2020-08-11: qty 200

## 2020-08-11 MED ORDER — SODIUM CHLORIDE 0.9 % IV SOLN
Freq: Once | INTRAVENOUS | Status: AC
Start: 2020-08-11 — End: 2020-08-11

## 2020-08-11 NOTE — Progress Notes (Signed)
Brittany Durham presents today for IV Venofer infusion per MDs orders in supportive therapy plan. Vitals have been reviewed and are stable and within parameters for infusion today. Infusion given without incident or complaint. Patient observed for 30 minutes post infusion per orders. VSS upon completion of infusion, IV flushed and removed per protocol, see MAR and IV flowsheet for details. Discharged in satisfactory condition with follow up instructions.

## 2020-08-11 NOTE — Patient Instructions (Signed)
[facility] at [facility]  Discharge Instructions:  Iron Sucrose injection What is this medicine? IRON SUCROSE (AHY ern SOO krohs) is an iron complex. Iron is used to make healthy red blood cells, which carry oxygen and nutrients throughout the body. This medicine is used to treat iron deficiency anemia in people with chronic kidney disease. This medicine may be used for other purposes; ask your health care provider or pharmacist if you have questions. COMMON BRAND NAME(S): Venofer What should I tell my health care provider before I take this medicine? They need to know if you have any of these conditions:  anemia not caused by low iron levels  heart disease  high levels of iron in the blood  kidney disease  liver disease  an unusual or allergic reaction to iron, other medicines, foods, dyes, or preservatives  pregnant or trying to get pregnant  breast-feeding How should I use this medicine? This medicine is for infusion into a vein. It is given by a health care professional in a hospital or clinic setting. Talk to your pediatrician regarding the use of this medicine in children. While this drug may be prescribed for children as young as 2 years for selected conditions, precautions do apply. Overdosage: If you think you have taken too much of this medicine contact a poison control center or emergency room at once. NOTE: This medicine is only for you. Do not share this medicine with others. What if I miss a dose? It is important not to miss your dose. Call your doctor or health care professional if you are unable to keep an appointment. What may interact with this medicine? Do not take this medicine with any of the following medications:  deferoxamine  dimercaprol  other iron products This medicine may also interact with the following medications:  chloramphenicol  deferasirox This list may not describe all possible interactions. Give your health  care provider a list of all the medicines, herbs, non-prescription drugs, or dietary supplements you use. Also tell them if you smoke, drink alcohol, or use illegal drugs. Some items may interact with your medicine. What should I watch for while using this medicine? Visit your doctor or healthcare professional regularly. Tell your doctor or healthcare professional if your symptoms do not start to get better or if they get worse. You may need blood work done while you are taking this medicine. You may need to follow a special diet. Talk to your doctor. Foods that contain iron include: whole grains/cereals, dried fruits, beans, or peas, leafy green vegetables, and organ meats (liver, kidney). What side effects may I notice from receiving this medicine? Side effects that you should report to your doctor or health care professional as soon as possible:  allergic reactions like skin rash, itching or hives, swelling of the face, lips, or tongue  breathing problems  changes in blood pressure  cough  fast, irregular heartbeat  feeling faint or lightheaded, falls  fever or chills  flushing, sweating, or hot feelings  joint or muscle aches/pains  seizures  swelling of the ankles or feet  unusually weak or tired Side effects that usually do not require medical attention (report to your doctor or health care professional if they continue or are bothersome):  diarrhea  feeling achy  headache  irritation at site where injected  nausea, vomiting  stomach upset  tiredness This list may not describe all possible side effects. Call your doctor for medical advice about side effects. You   may report side effects to FDA at 1-800-FDA-1088. Where should I keep my medicine? This drug is given in a hospital or clinic and will not be stored at home. NOTE: This sheet is a summary. It may not cover all possible information. If you have questions about this medicine, talk to your doctor,  pharmacist, or health care provider.  2021 Elsevier/Gold Standard (2011-02-08 17:14:35)  _______________________________________________________________  Thank you for choosing [facility] at [facility] to provide your oncology and hematology care.  To afford each patient quality time with our providers, please arrive at least 15 minutes before your scheduled appointment.  You need to re-schedule your appointment if you arrive 10 or more minutes late.  We strive to give you quality time with our providers, and arriving late affects you and other patients whose appointments are after yours.  Also, if you no show three or more times for appointments you may be dismissed from the clinic.  Again, thank you for choosing [facility] at [facility]. Our hope is that these requests will allow you access to exceptional care and in a timely manner. _______________________________________________________________  If you have questions after your visit, please contact our office at [phone] between the hours of 8:30 a.m. and 5:00 p.m. Voicemails left after 4:30 p.m. will not be returned until the following business day. _______________________________________________________________  For prescription refill requests, have your pharmacy contact our office. _______________________________________________________________  Recommendations made by the consultant and any test results will be sent to your referring physician. _______________________________________________________________ 

## 2020-08-18 DIAGNOSIS — M542 Cervicalgia: Secondary | ICD-10-CM | POA: Diagnosis not present

## 2020-08-22 ENCOUNTER — Other Ambulatory Visit: Payer: Self-pay

## 2020-08-22 ENCOUNTER — Encounter: Payer: Self-pay | Admitting: Internal Medicine

## 2020-08-22 ENCOUNTER — Other Ambulatory Visit (HOSPITAL_COMMUNITY)
Admission: RE | Admit: 2020-08-22 | Discharge: 2020-08-22 | Disposition: A | Discharge status: Home / Self Care | Payer: Medicare Other | Source: Ambulatory Visit | Attending: Internal Medicine | Admitting: Internal Medicine

## 2020-08-22 DIAGNOSIS — Z20822 Contact with and (suspected) exposure to covid-19: Secondary | ICD-10-CM | POA: Insufficient documentation

## 2020-08-22 DIAGNOSIS — Z01812 Encounter for preprocedural laboratory examination: Secondary | ICD-10-CM | POA: Diagnosis not present

## 2020-08-22 LAB — SARS CORONAVIRUS 2 (TAT 6-24 HRS): SARS Coronavirus 2: NEGATIVE

## 2020-08-24 ENCOUNTER — Other Ambulatory Visit: Payer: Self-pay

## 2020-08-24 ENCOUNTER — Ambulatory Visit (HOSPITAL_COMMUNITY)
Admission: RE | Admit: 2020-08-24 | Discharge: 2020-08-24 | Disposition: A | Discharge status: Home / Self Care | Payer: Medicare Other | Attending: Internal Medicine | Admitting: Internal Medicine

## 2020-08-24 ENCOUNTER — Encounter (HOSPITAL_COMMUNITY)
Admission: RE | Disposition: A | Discharge status: Home / Self Care | Payer: Self-pay | Source: Home / Self Care | Attending: Internal Medicine

## 2020-08-24 DIAGNOSIS — K5730 Diverticulosis of large intestine without perforation or abscess without bleeding: Secondary | ICD-10-CM | POA: Diagnosis not present

## 2020-08-24 DIAGNOSIS — Z79899 Other long term (current) drug therapy: Secondary | ICD-10-CM | POA: Insufficient documentation

## 2020-08-24 DIAGNOSIS — D62 Acute posthemorrhagic anemia: Secondary | ICD-10-CM

## 2020-08-24 HISTORY — PX: COLONOSCOPY: SHX5424

## 2020-08-24 SURGERY — COLONOSCOPY
Anesthesia: Moderate Sedation

## 2020-08-24 MED ORDER — ONDANSETRON HCL 4 MG/2ML IJ SOLN
INTRAMUSCULAR | Status: DC | PRN
  Administered 2020-08-24: 4 mg via INTRAVENOUS

## 2020-08-24 MED ORDER — SODIUM CHLORIDE 0.9 % IV SOLN
INTRAVENOUS | Status: DC
  Administered 2020-08-24: 1000 mL via INTRAVENOUS

## 2020-08-24 MED ORDER — LIDOCAINE VISCOUS HCL 2 % MT SOLN
OROMUCOSAL | Status: DC | PRN
  Administered 2020-08-24: 1 via OROMUCOSAL

## 2020-08-24 MED ORDER — MIDAZOLAM HCL 5 MG/5ML IJ SOLN
INTRAMUSCULAR | Status: DC | PRN
  Administered 2020-08-24 (×4): 2 mg via INTRAVENOUS

## 2020-08-24 MED ORDER — MIDAZOLAM HCL 5 MG/5ML IJ SOLN
INTRAMUSCULAR | Status: AC
  Filled 2020-08-24: qty 10

## 2020-08-24 MED ORDER — LIDOCAINE VISCOUS HCL 2 % MT SOLN
OROMUCOSAL | Status: AC
  Filled 2020-08-24: qty 15

## 2020-08-24 MED ORDER — MEPERIDINE HCL 100 MG/ML IJ SOLN
INTRAMUSCULAR | Status: DC | PRN
  Administered 2020-08-24: 50 mg via INTRAVENOUS

## 2020-08-24 MED ORDER — ONDANSETRON HCL 4 MG/2ML IJ SOLN
INTRAMUSCULAR | Status: AC
  Filled 2020-08-24: qty 2

## 2020-08-24 MED ORDER — MEPERIDINE HCL 50 MG/ML IJ SOLN
INTRAMUSCULAR | Status: AC
  Filled 2020-08-24: qty 1

## 2020-08-24 NOTE — H&P (Signed)
@LOGO @   Primary Care Physician:  Sharion Balloon, FNP Primary Gastroenterologist:  Dr. Gala Romney  Pre-Procedure History & Physical: HPI:  Brittany Durham is a 50 y.o. 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  No dysphagia.  Plan for colonoscopy with possible EGD to follow with capsule deployment as appropriate.   Past Medical History:  Diagnosis Date  . Asthma    childhood  . Carotidynia   . COPD (chronic obstructive pulmonary disease) ([place])   . DDD (degenerative disc disease), cervical   . Hypertension   . Knee pain    left meniscus tear s/p repair 11/2014  . Seizures ([place])   . Stuttering    patient reports this started after her knee surgery in 11/2014.   Marland Kitchen Thyroid nodule     Past Surgical History:  Procedure Laterality Date  . ANTERIOR CERVICAL DECOMP/DISCECTOMY FUSION N/A 03/20/2019   Procedure: Anterior Cervical Decompression Fusion - Cervical Four-Cervical Five - Cervical Five-Cervical Six;  Surgeon: Eustace Moore, MD;  Location: [facility];  Service: Neurosurgery;  Laterality: N/A;  Anterior Cervical Decompression Fusion - Cervical Four-Cervical Five - Cervical Five-Cervical Six  . COLONOSCOPY N/A 08/23/2016   Procedure: COLONOSCOPY;  Surgeon: Daneil Dolin, MD;  [facility];  Service: Endoscopy;  Laterality: N/A;  1:00pm  . ESOPHAGOGASTRODUODENOSCOPY N/A 05/03/2016   Procedure: ESOPHAGOGASTRODUODENOSCOPY (EGD);  Surgeon: Daneil Dolin, MD;  [facility];  Service: Endoscopy;  Laterality: N/A;  . ESOPHAGOGASTRODUODENOSCOPY N/A 08/23/2016   Procedure: ESOPHAGOGASTRODUODENOSCOPY (EGD);  Surgeon: Daneil Dolin, MD;  [facility];  Service: Endoscopy;  Laterality: N/A;  . KNEE ARTHROSCOPY WITH MEDIAL MENISECTOMY Left 12/09/2014   Procedure: KNEE ARTHROSCOPY WITH PARTIAL MEDIAL AND LATERAL MENISECTOMY;  Surgeon: Sanjuana Kava, MD;  Location: [facility];  Service: Orthopedics;  Laterality: Left;  . TUBAL LIGATION    . WISDOM TOOTH EXTRACTION      Prior to Admission medications   Medication Sig Start Date End Date Taking? Authorizing Provider  amLODipine (NORVASC) 10 MG tablet TAKE 1 TABLET ONCE DAILY Patient taking differently: Take 10 mg by mouth daily. 04/01/20  Yes Hawks, Christy A, FNP  baclofen (LIORESAL) 10 MG tablet TAKE  (1)  TABLET  THREE TIMES DAILY. Patient taking differently: Take 10 mg by mouth 3 (three) times daily. 07/19/20  Yes Hawks, Christy A, FNP  busPIRone (BUSPAR) 5 MG tablet Take 1 tablet (5 mg total) by mouth 3 (three) times daily. 07/15/20  Yes Ivy Lynn, NP  diclofenac (VOLTAREN) 75 MG EC tablet Take 75 mg by mouth daily. 05/21/19  Yes [provider]  docusate sodium (COLACE) 250 MG capsule Take 250 mg by mouth daily.   Yes [provider]  escitalopram (LEXAPRO) 20 MG tablet Take 1 tablet (20 mg total) by mouth daily. 07/15/20  Yes Ivy Lynn, NP  ferrous sulfate 325 (65 FE) MG tablet Take 1 tablet (325 mg total) by mouth daily. 07/18/20  Yes Marcello Fennel, PA-C  lisinopril (ZESTRIL) 20 MG tablet TAKE 1 TABLET DAILY Patient taking differently: Take 20 mg by mouth daily. 04/01/20  Yes Hawks, Christy A, FNP  meclizine (ANTIVERT) 25 MG tablet Take 1 tablet (25 mg total) by mouth 3 (three) times daily as needed for dizziness. 01/15/20  Yes Milton Ferguson, MD  pantoprazole (PROTONIX) 40 MG tablet TAKE 1 TABLET ONCE DAILY BEFORE BREAKFAST Patient taking differently: Take  40 mg by mouth daily. 03/23/20  Yes Hawks, Christy A, FNP  Vitamin D, Ergocalciferol, (DRISDOL) 1.25 MG (50000 UNIT) CAPS capsule Take 50,000 Units by mouth once a week. 05/29/19  Yes [provider]  CLENPIQ 10-3.5-12 MG-GM -GM/160ML SOLN SMARTSIG:320 Milliliter(s) By Mouth As Directed 07/21/20   [provider]    Allergies as of 07/21/2020  . (No Known Allergies)    Family History  Problem Relation  Age of Onset  . Hypertension Mother   . Lung cancer Father   . Hypertension Brother   . Colon cancer Neg Hx   . Gastric cancer Neg Hx   . Esophageal cancer Neg Hx     Social History   Socioeconomic History  . Marital status: Single    Spouse name: Not on file  . Number of children: 1  . Years of education: 42  . Highest education level: Not on file  Occupational History  . Occupation: Umemployed    Comment: Disability  Tobacco Use  . Smoking status: Never Smoker  . Smokeless tobacco: Never Used  Vaping Use  . Vaping Use: Never used  Substance and Sexual Activity  . Alcohol use: No  . Drug use: No  . Sexual activity: Not Currently    Birth control/protection: Surgical  Other Topics Concern  . Not on file  Social History Narrative   Lives at home alone.   Right-handed.   Occasional caffeine use.   Social Determinants of Health   Financial Resource Strain: Not on file  Food Insecurity: Not on file  Transportation Needs: Not on file  Physical Activity: Not on file  Stress: Not on file  Social Connections: Not on file  Intimate Partner Violence: Not on file    Review of Systems: See HPI, otherwise negative ROS  Physical Exam: There were no vitals taken for this visit. General:   Alert,  Well-developed, well-nourished, pleasant and cooperative in NAD Neck:  Supple; no masses or thyromegaly. No significant cervical adenopathy. Lungs:  Clear throughout to auscultation.   No wheezes, crackles, or rhonchi. No acute distress. Heart:  Regular rate and rhythm; no murmurs, clicks, rubs,  or gallops. Abdomen: Non-distended, normal bowel sounds.  Soft and nontender without appreciable mass or hepatosplenomegaly.  Pulses:  Normal pulses noted. Extremities:  Without clubbing or edema.  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  Some dark stools but no overt GI bleeding.  I have offered the patient a colonoscopy with possible EGD and capsule  placement to follow per plan.  The risks, benefits, limitations, imponderables and alternatives regarding both EGD and colonoscopy have been reviewed with the patient. Questions have been answered. All parties agreeable.      Notice: This dictation was prepared with Dragon dictation along with smaller phrase technology. Any transcriptional errors that result from this process are unintentional and may not be corrected upon review.

## 2020-08-24 NOTE — Discharge Instructions (Signed)
Colonoscopy Discharge Instructions  Read the instructions outlined below and refer to this sheet in the next few weeks. These discharge instructions provide you with general information on caring for yourself after you leave the hospital. Your doctor may also give you specific instructions. While your treatment has been planned according to the most current medical practices available, unavoidable complications occasionally occur. If you have any problems or questions after discharge, call Dr. Gala Romney at [phone]. ACTIVITY  You may resume your regular activity, but move at a slower pace for the next 24 hours.   Take frequent rest periods for the next 24 hours.   Walking will help get rid of the air and reduce the bloated feeling in your belly (abdomen).   No driving for 24 hours (because of the medicine (anesthesia) used during the test).    Do not sign any important legal documents or operate any machinery for 24 hours (because of the anesthesia used during the test).  NUTRITION  Drink plenty of fluids.   You may resume your normal diet as instructed by your doctor.   Begin with a light meal and progress to your normal diet. Heavy or fried foods are harder to digest and may make you feel sick to your stomach (nauseated).   Avoid alcoholic beverages for 24 hours or as instructed.  MEDICATIONS  You may resume your normal medications unless your doctor tells you otherwise.  WHAT YOU CAN EXPECT TODAY  Some feelings of bloating in the abdomen.   Passage of more gas than usual.   Spotting of blood in your stool or on the toilet paper.  IF YOU HAD POLYPS REMOVED DURING THE COLONOSCOPY:  No aspirin products for 7 days or as instructed.   No alcohol for 7 days or as instructed.   Eat a soft diet for the next 24 hours.  FINDING OUT THE RESULTS OF YOUR TEST Not all test results are available during your visit. If your test results are not back during the visit, make an appointment  with your caregiver to find out the results. Do not assume everything is normal if you have not heard from your caregiver or the medical facility. It is important for you to follow up on all of your test results.  SEEK IMMEDIATE MEDICAL ATTENTION IF:  You have more than a spotting of blood in your stool.   Your belly is swollen (abdominal distention).   You are nauseated or vomiting.   You have a temperature over 101.   You have abdominal pain or discomfort that is severe or gets worse throughout the day.   Diverticulosis only found in your colon today  You could not be safely sedated to perform the upper endoscopy and placed a capsule today  You will need to return for those procedures with the help of anesthesia for sedation.  At patient request, I called [facility] at [phone] -reviewed findings and recommendations   Diverticulosis  Diverticulosis is a condition that develops when small pouches (diverticula) form in the wall of the large intestine (colon). The colon is where water is absorbed and stool (feces) is formed. The pouches form when the inside layer of the colon pushes through weak spots in the outer layers of the colon. You may have a few pouches or many of them. The pouches usually do not cause problems unless they become inflamed or infected. When this happens, the condition is called diverticulitis. What are the causes? The cause of this condition is not  known. What increases the risk? The following factors may make you more likely to develop this condition:  Being older than age 22. Your risk for this condition increases with age. Diverticulosis is rare among people younger than age 64. By age 74, many people have it.  Eating a low-fiber diet.  Having frequent constipation.  Being overweight.  Not getting enough exercise.  Smoking.  Taking over-the-counter pain medicines, like aspirin and ibuprofen.  Having a family history of diverticulosis. What are the  signs or symptoms? In most people, there are no symptoms of this condition. If you do have symptoms, they may include:  Bloating.  Cramps in the abdomen.  Constipation or diarrhea.  Pain in the lower left side of the abdomen. How is this diagnosed? Because diverticulosis usually has no symptoms, it is most often diagnosed during an exam for other colon problems. The condition may be diagnosed by:  Using a flexible scope to examine the colon (colonoscopy).  Taking an X-ray of the colon after dye has been put into the colon (barium enema).  Having a CT scan. How is this treated? You may not need treatment for this condition. Your health care provider may recommend treatment to prevent problems. You may need treatment if you have symptoms or if you previously had diverticulitis. Treatment may include:  Eating a high-fiber diet.  Taking a fiber supplement.  Taking a live bacteria supplement (probiotic).  Taking medicine to relax your colon.   Follow these instructions at home: Medicines  Take over-the-counter and prescription medicines only as told by your health care provider.  If told by your health care provider, take a fiber supplement or probiotic. Constipation prevention Your condition may cause constipation. To prevent or treat constipation, you may need to:  Drink enough fluid to keep your urine pale yellow.  Take over-the-counter or prescription medicines.  Eat foods that are high in fiber, such as beans, whole grains, and fresh fruits and vegetables.  Limit foods that are high in fat and processed sugars, such as fried or sweet foods.   General instructions  Try not to strain when you have a bowel movement.  Keep all follow-up visits as told by your health care provider. This is important. Contact a health care provider if you:  Have pain in your abdomen.  Have bloating.  Have cramps.  Have not had a bowel movement in 3 days. Get help right away  if:  Your pain gets worse.  Your bloating becomes very bad.  You have a fever or chills, and your symptoms suddenly get worse.  You vomit.  You have bowel movements that are bloody or black.  You have bleeding from your rectum. Summary  Diverticulosis is a condition that develops when small pouches (diverticula) form in the wall of the large intestine (colon).  You may have a few pouches or many of them.  This condition is most often diagnosed during an exam for other colon problems.  Treatment may include increasing the fiber in your diet, taking supplements, or taking medicines. This information is not intended to replace advice given to you by your health care provider. Make sure you discuss any questions you have with your health care provider. Document Revised: 11/27/2018 Document Reviewed: 11/27/2018 Elsevier Patient Education  [place].

## 2020-08-24 NOTE — Op Note (Addendum)
[facility] Patient Name: Brittany Durham Procedure Date: 08/24/2020 11:35 AM MRN: [id number] Date of Birth: 26-Nov-1970 Attending MD: Gennette Pac , MD CSN: [id number] Age: 50 Admit Type: Outpatient Procedure:                Colonoscopy Indications:              Acute post hemorrhagic anemia Providers:                Gennette Pac, MD, Nena Polio, RN, Durwin Glaze Tech, Technician Referring MD:              Medicines:                Midazolam 8 mg IV, Meperidine 50 mg IV Complications:            No immediate complications. Estimated Blood Loss:     Estimated blood loss: none. Procedure:                Pre-Anesthesia Assessment:                           - Prior to the procedure, a History and Physical                            was performed, and patient medications and                            allergies were reviewed. The patient's tolerance of                            previous anesthesia was also reviewed. The risks                            and benefits of the procedure and the sedation                            options and risks were discussed with the patient.                            All questions were answered, and informed consent                            was obtained. Prior Anticoagulants: The patient has                            taken no previous anticoagulant or antiplatelet                            agents. ASA Grade Assessment: III - A patient with                            severe systemic disease. After reviewing the risks  and benefits, the patient was deemed in                            satisfactory condition to undergo the procedure.                           After obtaining informed consent, the colonoscope                            was passed under direct vision. Throughout the                            procedure, the patient's blood pressure, pulse, and                             oxygen saturations were monitored continuously. The                            CF-HQ190L ([id number]) scope was introduced through                            the anus and advanced to the the cecum, identified                            by appendiceal orifice and ileocecal valve. The                            colonoscopy was performed without difficulty. The                            patient tolerated the procedure well. The quality                            of the bowel preparation was adequate. Scope In: 12:33:45 PM Scope Out: 12:59:07 PM Scope Withdrawal Time: 0 hours 6 minutes 52 seconds  Total Procedure Duration: 0 hours 25 minutes 22 seconds  Findings:      The perianal and digital rectal examinations were normal. Markedly       redundant colon requiring changing of the patient's position and       external abdominal pressure to reach the cecum.      Scattered small and large-mouthed diverticula were found in the entire       colon.      The exam was otherwise without abnormality on direct and retroflexion       views. Impression:               - Diverticulosis in the entire examined colon.                            Redundant colon.                           - The examination was otherwise normal on direct  and retroflexion views.                           - No specimens collected.                           I attempted to perform an EGD but patient was not                            cooperative for esophageal intubation.                            Consequently, that procedure was not performed.                            Would be best practice for her to return for EGD                            with propofol with possible capsule placement in                            the near future. Moderate Sedation:      Moderate (conscious) sedation was administered by the endoscopy nurse       and supervised by the endoscopist. The following parameters were        monitored: oxygen saturation, heart rate, blood pressure, respiratory       rate, EKG, adequacy of pulmonary ventilation, and response to care.       Total physician intraservice time was 29 minutes. Recommendation:           - Patient has a contact number available for                            emergencies. The signs and symptoms of potential                            delayed complications were discussed with the                            patient. Return to normal activities tomorrow.                            Written discharge instructions were provided to the                            patient.                           - Continue present medications.                           - Repeat colonoscopy in 10 years for screening                            purposes.                           -  Return to GI office (date not yet determined).                            See EGD report. Procedure Code(s):        --- Professional ---                           [phone], Colonoscopy, flexible; diagnostic, including                            collection of specimen(s) by brushing or washing,                            when performed (separate procedure)                           99153, Moderate sedation; each additional 15                            minutes intraservice time                           G0500, Moderate sedation services provided by the                            same physician or other qualified health care                            professional performing a gastrointestinal                            endoscopic service that sedation supports,                            requiring the presence of an independent trained                            observer to assist in the monitoring of the                            patient's level of consciousness and physiological                            status; initial 15 minutes of intra-service time;                            patient age  56 years or older (additional time may                            be reported with 60454, as appropriate) Diagnosis Code(s):        --- Professional ---                           D62, Acute posthemorrhagic anemia  K57.30, Diverticulosis of large intestine without                            perforation or abscess without bleeding CPT copyright 2019 American Medical Association. All rights reserved. The codes documented in this report are preliminary and upon coder review may  be revised to meet current compliance requirements. Gerrit Friends. Genasis Zingale, MD Gennette Pac, MD 08/24/2020 1:04:30 PM This report has been signed electronically. Number of Addenda: 0

## 2020-08-30 ENCOUNTER — Encounter (HOSPITAL_COMMUNITY): Payer: Self-pay | Admitting: Internal Medicine

## 2020-08-31 ENCOUNTER — Telehealth: Payer: Self-pay | Admitting: Internal Medicine

## 2020-08-31 ENCOUNTER — Telehealth: Payer: Self-pay | Admitting: Nurse Practitioner

## 2020-08-31 NOTE — Telephone Encounter (Signed)
See prior note

## 2020-08-31 NOTE — Telephone Encounter (Signed)
Called home#, no answer unable to leave VM, called mobile, no answer, LMTCB

## 2020-08-31 NOTE — Telephone Encounter (Signed)
As per below, please schedule the patient for EGD +/- capsule endoscopy with Dr. Gala Romney on propofol/MAC.  ASA 3  Hold iron 1 week prior to procedure.

## 2020-08-31 NOTE — Telephone Encounter (Signed)
-----   Message from Daneil Dolin, MD sent at 08/25/2020  2:21 PM EDT ----- Good question;  lets just get her turned around and back on the schedule with propofol EGD +/- capsule.  Thanks. ----- Message ----- From: Carlis Stable, NP Sent: 08/25/2020   1:40 PM EDT To: Daneil Dolin, MD  Will do  Is it ok to just schedule, or will she need an OV?  Randall Hiss  ----- Message ----- From: Daneil Dolin, MD Sent: 08/24/2020   1:22 PM EDT To: Carlis Stable, NP  Please schedule this patient for an EGD with possible capsule placement-propofol.  Got colonoscopy done today-barely with conscious sedation.  Would not cooperate for upper Endo.

## 2020-08-31 NOTE — Telephone Encounter (Signed)
Called mobile# [email or web address] to call back

## 2020-08-31 NOTE — Telephone Encounter (Addendum)
Called pt daughter and procedure scheduled for 5/10 for EGD +/-givens with propofol ASA 3 per Dr. Gala Romney. Advised hold iron 1 week prior and will mail instructions with pre-op/covid test appt. Confirmed address.

## 2020-08-31 NOTE — Telephone Encounter (Signed)
Rolm Gala was returning call in regards to patient. Please call [phone]

## 2020-08-31 NOTE — Telephone Encounter (Signed)
Called pt, no answer and VM not set up 

## 2020-09-01 ENCOUNTER — Encounter: Payer: Self-pay | Admitting: *Deleted

## 2020-09-13 ENCOUNTER — Other Ambulatory Visit: Payer: Self-pay

## 2020-09-13 ENCOUNTER — Encounter: Payer: Self-pay | Admitting: Family Medicine

## 2020-09-13 ENCOUNTER — Inpatient Hospital Stay (HOSPITAL_COMMUNITY): Payer: Medicare Other | Attending: Hematology

## 2020-09-13 DIAGNOSIS — D509 Iron deficiency anemia, unspecified: Secondary | ICD-10-CM | POA: Diagnosis not present

## 2020-09-13 DIAGNOSIS — D649 Anemia, unspecified: Secondary | ICD-10-CM

## 2020-09-13 DIAGNOSIS — Z8711 Personal history of peptic ulcer disease: Secondary | ICD-10-CM

## 2020-09-13 LAB — CBC WITH DIFFERENTIAL/PLATELET
Abs Immature Granulocytes: 0.02 10*3/uL (ref 0.00–0.07)
Basophils Absolute: 0 10*3/uL (ref 0.0–0.1)
Basophils Relative: 0 %
Eosinophils Absolute: 0.2 10*3/uL (ref 0.0–0.5)
Eosinophils Relative: 4 %
HCT: 36.8 % (ref 36.0–46.0)
Hemoglobin: 11.2 g/dL — ABNORMAL LOW (ref 12.0–15.0)
Immature Granulocytes: 0 %
Lymphocytes Relative: 30 %
Lymphs Abs: 2 10*3/uL (ref 0.7–4.0)
MCH: 26.4 pg (ref 26.0–34.0)
MCHC: 30.4 g/dL (ref 30.0–36.0)
MCV: 86.8 fL (ref 80.0–100.0)
Monocytes Absolute: 0.5 10*3/uL (ref 0.1–1.0)
Monocytes Relative: 7 %
Neutro Abs: 3.9 10*3/uL (ref 1.7–7.7)
Neutrophils Relative %: 59 %
Platelets: 215 10*3/uL (ref 150–400)
RBC: 4.24 MIL/uL (ref 3.87–5.11)
RDW: 19.5 % — ABNORMAL HIGH (ref 11.5–15.5)
WBC: 6.7 10*3/uL (ref 4.0–10.5)
nRBC: 0 % (ref 0.0–0.2)

## 2020-09-13 LAB — IRON AND TIBC
Iron: 40 ug/dL (ref 28–170)
Saturation Ratios: 9 % — ABNORMAL LOW (ref 10.4–31.8)
TIBC: 436 ug/dL (ref 250–450)
UIBC: 396 ug/dL

## 2020-09-13 LAB — COMPREHENSIVE METABOLIC PANEL
ALT: 24 U/L (ref 0–44)
AST: 22 U/L (ref 15–41)
Albumin: 4.4 g/dL (ref 3.5–5.0)
Alkaline Phosphatase: 88 U/L (ref 38–126)
Anion gap: 8 (ref 5–15)
BUN: 16 mg/dL (ref 6–20)
CO2: 27 mmol/L (ref 22–32)
Calcium: 9.8 mg/dL (ref 8.9–10.3)
Chloride: 105 mmol/L (ref 98–111)
Creatinine, Ser: 0.78 mg/dL (ref 0.44–1.00)
GFR, Estimated: >60 mL/min (ref >60)
Glucose, Bld: 94 mg/dL (ref 70–99)
Potassium: 3.9 mmol/L (ref 3.5–5.1)
Sodium: 140 mmol/L (ref 135–145)
Total Bilirubin: 0.6 mg/dL (ref 0.3–1.2)
Total Protein: 8.1 g/dL (ref 6.5–8.1)

## 2020-09-13 LAB — FERRITIN: Ferritin: 23 ng/mL (ref 11–307)

## 2020-09-14 NOTE — Patient Instructions (Signed)
[facility]  09/14/2020     @PREFPERIOPPHARMACY @   Your procedure is scheduled on 09/20/2020.   Report to [facility] at  [place].M.   Call this number if you have problems the morning of surgery:  [phone]   Remember:  Follow the diet instructions given to you by the office.                        Take these medicines the morning of surgery with A SIP OF WATER  Amlodipine, baclofen, buspar, voltaren, lexapro, antivert (If needed)     Please brush your teeth.  Do not wear jewelry, make-up or nail polish.  Do not wear lotions, powders, or perfumes, or deodorant.  Do not shave 48 hours prior to surgery.  Men may shave face and neck.  Do not bring valuables to the hospital.  [facility] is not responsible for any belongings or valuables.  Contacts, dentures or bridgework may not be worn into surgery.  Leave your suitcase in the car.  After surgery it may be brought to your room.  For patients admitted to the hospital, discharge time will be determined by your treatment team.  Patients discharged the day of surgery will not be allowed to drive home and must have someone with them for 24 hours.     Special instructions:  DO NOT smoke tobacco or vape for 24 hours before your procedure.  Please read over the following fact sheets that you were given. Anesthesia Post-op Instructions and Care and Recovery After Surgery       Upper Endoscopy, Adult, Care After This sheet gives you information about how to care for yourself after your procedure. Your health care provider may also give you more specific instructions. If you have problems or questions, contact your health care provider. What can I expect after the procedure? After the procedure, it is common to have:  A sore throat.  Mild stomach pain or discomfort.  Bloating.  Nausea. Follow these instructions at home:  Follow instructions from your health care provider about what to eat or drink after  your procedure.  Return to your normal activities as told by your health care provider. Ask your health care provider what activities are safe for you.  Take over-the-counter and prescription medicines only as told by your health care provider.  If you were given a sedative during the procedure, it can affect you for several hours. Do not drive or operate machinery until your health care provider says that it is safe.  Keep all follow-up visits as told by your health care provider. This is important.   Contact a health care provider if you have:  A sore throat that lasts longer than one day.  Trouble swallowing. Get help right away if:  You vomit blood or your vomit looks like coffee grounds.  You have: ? A fever. ? Bloody, black, or tarry stools. ? A severe sore throat or you cannot swallow. ? Difficulty breathing. ? Severe pain in your chest or abdomen. Summary  After the procedure, it is common to have a sore throat, mild stomach discomfort, bloating, and nausea.  If you were given a sedative during the procedure, it can affect you for several hours. Do not drive or operate machinery until your health care provider says that it is safe.  Follow instructions from your health care provider about what to eat or drink  after your procedure.  Return to your normal activities as told by your health care provider. This information is not intended to replace advice given to you by your health care provider. Make sure you discuss any questions you have with your health care provider. Document Revised: 04/28/2019 Document Reviewed: 09/30/2017 Elsevier Patient Education  2021 [place] After This sheet gives you information about how to care for yourself after your procedure. Your health care provider may also give you more specific instructions. If you have problems or questions, contact your health care provider. What can I expect after the  procedure? After the procedure, it is common to have:  Tiredness.  Forgetfulness about what happened after the procedure.  Impaired judgment for important decisions.  Nausea or vomiting.  Some difficulty with balance. Follow these instructions at home: For the time period you were told by your health care provider:  Rest as needed.  Do not participate in activities where you could fall or become injured.  Do not drive or use machinery.  Do not drink alcohol.  Do not take sleeping pills or medicines that cause drowsiness.  Do not make important decisions or sign legal documents.  Do not take care of children on your own.      Eating and drinking  Follow the diet that is recommended by your health care provider.  Drink enough fluid to keep your urine pale yellow.  If you vomit: ? Drink water, juice, or soup when you can drink without vomiting. ? Make sure you have little or no nausea before eating solid foods. General instructions  Have a responsible adult stay with you for the time you are told. It is important to have someone help care for you until you are awake and alert.  Take over-the-counter and prescription medicines only as told by your health care provider.  If you have sleep apnea, surgery and certain medicines can increase your risk for breathing problems. Follow instructions from your health care provider about wearing your sleep device: ? Anytime you are sleeping, including during daytime naps. ? While taking prescription pain medicines, sleeping medicines, or medicines that make you drowsy.  Avoid smoking.  Keep all follow-up visits as told by your health care provider. This is important. Contact a health care provider if:  You keep feeling nauseous or you keep vomiting.  You feel light-headed.  You are still sleepy or having trouble with balance after 24 hours.  You develop a rash.  You have a fever.  You have redness or swelling around  the IV site. Get help right away if:  You have trouble breathing.  You have new-onset confusion at home. Summary  For several hours after your procedure, you may feel tired. You may also be forgetful and have poor judgment.  Have a responsible adult stay with you for the time you are told. It is important to have someone help care for you until you are awake and alert.  Rest as told. Do not drive or operate machinery. Do not drink alcohol or take sleeping pills.  Get help right away if you have trouble breathing, or if you suddenly become confused. This information is not intended to replace advice given to you by your health care provider. Make sure you discuss any questions you have with your health care provider. Document Revised: 01/14/2020 Document Reviewed: 04/02/2019 Elsevier Patient Education  2021 [employer].

## 2020-09-15 ENCOUNTER — Inpatient Hospital Stay (HOSPITAL_BASED_OUTPATIENT_CLINIC_OR_DEPARTMENT_OTHER): Payer: Medicare Other | Admitting: Physician Assistant

## 2020-09-15 ENCOUNTER — Other Ambulatory Visit: Payer: Self-pay

## 2020-09-15 ENCOUNTER — Ambulatory Visit (HOSPITAL_COMMUNITY): Payer: Medicare Other | Admitting: Physician Assistant

## 2020-09-15 VITALS — BP 123/79 | HR 107 | Temp 98.1°F | Resp 19 | Wt 231.0 lb

## 2020-09-15 DIAGNOSIS — D500 Iron deficiency anemia secondary to blood loss (chronic): Secondary | ICD-10-CM | POA: Insufficient documentation

## 2020-09-15 DIAGNOSIS — D509 Iron deficiency anemia, unspecified: Secondary | ICD-10-CM | POA: Diagnosis not present

## 2020-09-15 NOTE — Patient Instructions (Signed)
[place] at [facility] Discharge Instructions  You were seen today by Tarri Abernethy PA-C for your iron-deficiency anemia.  Your blood counts look better after your last iron treatment, but your iron levels are still low.  We will schedule you for another round of IV iron (3 treatments).  You will also need to continue to see the GI doctors to determine where you are losing blood from.  We will repeat labs and see you back at our office in 3 months.    LABS: Return in 3 months for repeat labs.   OTHER TESTS: Continue to follow with GI for EGD and capsule study  MEDICATIONS: No changes.  Continue iron pill.  FOLLOW-UP APPOINTMENT: 3 months   Thank you for choosing [place] at [facility] to provide your oncology and hematology care.  To afford each patient quality time with our provider, please arrive at least 15 minutes before your scheduled appointment time.   If you have a lab appointment with the [facility] please come in thru the Main Entrance and check in at the main information desk.  You need to re-schedule your appointment should you arrive 10 or more minutes late.  We strive to give you quality time with our providers, and arriving late affects you and other patients whose appointments are after yours.  Also, if you no show three or more times for appointments you may be dismissed from the clinic at the providers discretion.     Again, thank you for choosing [facility].  Our hope is that these requests will decrease the amount of time that you wait before being seen by our physicians.       _____________________________________________________________  Should you have questions after your visit to [facility], please contact our office at [phone] and follow the prompts.  Our office hours are 8:00 a.m. and 4:30 p.m. Monday - Friday.  Please note that voicemails left after 4:00 p.m. may not be  returned until the following business day.  We are closed weekends and major holidays.  You do have access to a nurse 24-7, just call the main number to the clinic [phone] and do not press any options, hold on the line and a nurse will answer the phone.    For prescription refill requests, have your pharmacy contact our office and allow 72 hours.    Due to Covid, you will need to wear a mask upon entering the hospital. If you do not have a mask, a mask will be given to you at the Main Entrance upon arrival. For doctor visits, patients may have 1 support person age 89 or older with them. For treatment visits, patients can not have anyone with them due to social distancing guidelines and our immunocompromised population.

## 2020-09-15 NOTE — Progress Notes (Signed)
[place], [place] [address]   CLINIC:  Medical Oncology/Hematology  PCP:  Sharion Balloon, [place] [address] [phone]   REASON FOR VISIT:  Follow-up for iron deficiency anemia  CURRENT THERAPY: Intermittent IV iron  INTERVAL HISTORY:  Brittany Durham 50 y.o. female returns for routine follow-up of iron deficiency anemia suspected to be from occult GI bleeding.  She was seen by Dr. Chryl Heck on 07/29/2020.  She presented to the ED on 07/18/2020 after PCP found Hgb 5.7 while working patient up for fatigue and lightheadedness.  Patient denied overt GI bleeding and was Hemoccult-negative, but does have a history of peptic ulcer disease.  She was transfused 2 units PRBCs on 07/18/2020, and followed up with gastroenterology.  Colonoscopy (08/24/2020) showed diverticulosis, but was otherwise normal.  She is scheduled for EGD with possible capsule placement on 09/20/20.  She was seen for initial hematology consultation by Dr. Chryl Heck on 07/29/2020.  Work-up on that date showed Hgb 8.5 (microcytic with MCV 78.2).  Ferritin was 7, with 32% iron saturation.  Reticulocytes elevated at 3.4 (reticulocyte index 1.61, consistent with hypoproliferation in the setting of iron deficiency).  She received IV Venofer x 800mg  in divided doses from 08/02/2020 through 08/11/2020.  Repeat labs from 09/13/2020 show Hgb improved to 11.2 with normal MCV 86.8.  Iron saturation was low at 9%, with ferritin 23.  At today's visit, she reports that she has been doing better after iron infusions.  She has improved energy (currently rated at 50%), but is not yet back to baseline.  She denies any chest pain, shortness of breath, palpitations, presyncopal episodes, lightheadedness.  She continues to take iron tablets along with stool softeners at home.  She denies any overt gastrointestinal bleeding or other source of blood loss, but does note some darkness on the toilet paper when  she wipes after a bowel movement.  She has 50% energy and 100% appetite. She endorses that she is maintaining a stable weight.    REVIEW OF SYSTEMS:  Review of Systems  Constitutional: Positive for fatigue. Negative for appetite change, chills, diaphoresis, fever and unexpected weight change.  HENT:   Negative for lump/mass and nosebleeds.   Eyes: Negative for eye problems.  Respiratory: Negative for cough, hemoptysis and shortness of breath.   Cardiovascular: Negative for chest pain, leg swelling and palpitations.  Gastrointestinal: Negative for abdominal pain, blood in stool, constipation, diarrhea, nausea and vomiting.  Genitourinary: Negative for hematuria.   Skin: Negative.   Neurological: Positive for numbness (Left arm numbness and tingling related to cervical radiculopathy). Negative for dizziness, headaches and light-headedness.  Hematological: Does not bruise/bleed easily.      PAST MEDICAL/SURGICAL HISTORY:  Past Medical History:  Diagnosis Date  . Asthma    childhood  . Carotidynia   . COPD (chronic obstructive pulmonary disease) ([place])   . DDD (degenerative disc disease), cervical   . Hypertension   . Knee pain    left meniscus tear s/p repair 11/2014  . Seizures ([place])   . Stuttering    patient reports this started after her knee surgery in 11/2014.   Marland Kitchen Thyroid nodule    Past Surgical History:  Procedure Laterality Date  . ANTERIOR CERVICAL DECOMP/DISCECTOMY FUSION N/A 03/20/2019   Procedure: Anterior Cervical Decompression Fusion - Cervical Four-Cervical Five - Cervical Five-Cervical Six;  Surgeon: Eustace Moore, MD;  Location: [facility];  Service: Neurosurgery;  Laterality: N/A;  Anterior Cervical  Decompression Fusion - Cervical Four-Cervical Five - Cervical Five-Cervical Six  . COLONOSCOPY N/A 08/23/2016   Procedure: COLONOSCOPY;  Surgeon: Daneil Dolin, MD;  [facility];  Service: Endoscopy;  Laterality: N/A;  1:00pm  . COLONOSCOPY N/A 08/24/2020    Procedure: COLONOSCOPY;  Surgeon: Daneil Dolin, MD;  [facility];  Service: Endoscopy;  Laterality: N/A;  AM or PM (early as possible as has to have transportation)  . ESOPHAGOGASTRODUODENOSCOPY N/A 05/03/2016   Procedure: ESOPHAGOGASTRODUODENOSCOPY (EGD);  Surgeon: Daneil Dolin, MD;  [facility];  Service: Endoscopy;  Laterality: N/A;  . ESOPHAGOGASTRODUODENOSCOPY N/A 08/23/2016   Procedure: ESOPHAGOGASTRODUODENOSCOPY (EGD);  Surgeon: Daneil Dolin, MD;  [facility];  Service: Endoscopy;  Laterality: N/A;  . KNEE ARTHROSCOPY WITH MEDIAL MENISECTOMY Left 12/09/2014   Procedure: KNEE ARTHROSCOPY WITH PARTIAL MEDIAL AND LATERAL MENISECTOMY;  Surgeon: Sanjuana Kava, MD;  Location: [facility];  Service: Orthopedics;  Laterality: Left;  . TUBAL LIGATION    . WISDOM TOOTH EXTRACTION       SOCIAL HISTORY:  Social History   Socioeconomic History  . Marital status: Single    Spouse name: Not on file  . Number of children: 1  . Years of education: 81  . Highest education level: Not on file  Occupational History  . Occupation: Umemployed    Comment: Disability  Tobacco Use  . Smoking status: Never Smoker  . Smokeless tobacco: Never Used  Vaping Use  . Vaping Use: Never used  Substance and Sexual Activity  . Alcohol use: No  . Drug use: No  . Sexual activity: Not Currently    Birth control/protection: Surgical  Other Topics Concern  . Not on file  Social History Narrative   Lives at home alone.   Right-handed.   Occasional caffeine use.   Social Determinants of Health   Financial Resource Strain: Not on file  Food Insecurity: Not on file  Transportation Needs: Not on file  Physical Activity: Not on file  Stress: Not on file  Social Connections: Not on file  Intimate Partner Violence: Not on file    FAMILY HISTORY:  Family History  Problem Relation Age of Onset  . Hypertension Mother   . Lung cancer Father   . Hypertension Brother   .  Colon cancer Neg Hx   . Gastric cancer Neg Hx   . Esophageal cancer Neg Hx     CURRENT MEDICATIONS:  Outpatient Encounter Medications as of 09/15/2020  Medication Sig Note  . amLODipine (NORVASC) 10 MG tablet TAKE 1 TABLET ONCE DAILY (Patient taking differently: Take 10 mg by mouth daily.)   . baclofen (LIORESAL) 10 MG tablet TAKE  (1)  TABLET  THREE TIMES DAILY. (Patient taking differently: Take 10 mg by mouth 3 (three) times daily.)   . busPIRone (BUSPAR) 5 MG tablet Take 1 tablet (5 mg total) by mouth 3 (three) times daily.   Marland Kitchen CLENPIQ 10-3.5-12 MG-GM -GM/160ML SOLN SMARTSIG:320 Milliliter(s) By Mouth As Directed   . diclofenac (VOLTAREN) 75 MG EC tablet Take 75 mg by mouth daily.   Marland Kitchen docusate sodium (COLACE) 250 MG capsule Take 250 mg by mouth daily.   Marland Kitchen escitalopram (LEXAPRO) 20 MG tablet Take 1 tablet (20 mg total) by mouth daily.   . ferrous sulfate 325 (65 FE) MG tablet Take 1 tablet (325 mg total) by mouth daily.   Marland Kitchen lisinopril (ZESTRIL) 20 MG tablet TAKE 1 TABLET DAILY (Patient taking differently: Take 20  mg by mouth daily.)   . meclizine (ANTIVERT) 25 MG tablet Take 1 tablet (25 mg total) by mouth 3 (three) times daily as needed for dizziness.   . pantoprazole (PROTONIX) 40 MG tablet TAKE 1 TABLET ONCE DAILY BEFORE BREAKFAST (Patient taking differently: Take 40 mg by mouth daily.)   . Vitamin D, Ergocalciferol, (DRISDOL) 1.25 MG (50000 UNIT) CAPS capsule Take 50,000 Units by mouth once a week. 08/18/2020: Wednesdays    No facility-administered encounter medications on file as of 09/15/2020.    ALLERGIES:  No Known Allergies   PHYSICAL EXAM:  ECOG PERFORMANCE STATUS: 1 - Symptomatic but completely ambulatory  There were no vitals filed for this visit. There were no vitals filed for this visit. Physical Exam Constitutional:      Appearance: Normal appearance. She is obese.  HENT:     Head: Normocephalic and atraumatic.     Mouth/Throat:     Mouth: Mucous membranes are moist.   Eyes:     Extraocular Movements: Extraocular movements intact.     Pupils: Pupils are equal, round, and reactive to light.  Cardiovascular:     Rate and Rhythm: Normal rate and regular rhythm.     Pulses: Normal pulses.     Heart sounds: Normal heart sounds.  Pulmonary:     Effort: Pulmonary effort is normal.     Breath sounds: Normal breath sounds.  Abdominal:     General: Bowel sounds are normal.     Palpations: Abdomen is soft.     Tenderness: There is no abdominal tenderness.  Musculoskeletal:        General: No swelling.     Right lower leg: No edema.     Left lower leg: No edema.  Lymphadenopathy:     Cervical: No cervical adenopathy.  Skin:    General: Skin is warm and dry.  Neurological:     General: No focal deficit present.     Mental Status: She is alert and oriented to person, place, and time.     Comments: Marked speech impediment and stuttering speech throughout visit  Psychiatric:        Mood and Affect: Mood normal.        Behavior: Behavior normal.      LABORATORY DATA:  I have reviewed the labs as listed.  CBC    Component Value Date/Time   WBC 6.7 09/13/2020 1306   RBC 4.24 09/13/2020 1306   HGB 11.2 (L) 09/13/2020 1306   HGB 5.7 (LL) 07/15/2020 1005   HCT 36.8 09/13/2020 1306   HCT 20.9 (L) 07/15/2020 1005   PLT 215 09/13/2020 1306   PLT 334 07/15/2020 1005   MCV 86.8 09/13/2020 1306   MCV 69 (L) 07/15/2020 1005   MCH 26.4 09/13/2020 1306   MCHC 30.4 09/13/2020 1306   RDW 19.5 (H) 09/13/2020 1306   RDW 17.6 (H) 07/15/2020 1005   LYMPHSABS 2.0 09/13/2020 1306   LYMPHSABS 1.9 07/15/2020 1005   MONOABS 0.5 09/13/2020 1306   EOSABS 0.2 09/13/2020 1306   EOSABS 0.1 07/15/2020 1005   BASOSABS 0.0 09/13/2020 1306   BASOSABS 0.0 07/15/2020 1005   CMP Latest Ref Rng & Units 09/13/2020 07/29/2020 07/18/2020  Glucose 70 - 99 mg/dL 94 92 96  BUN 6 - 20 mg/dL 16 10 9   Creatinine 0.44 - 1.00 mg/dL 0.78 0.81 0.71  Sodium 135 - 145 mmol/L 140 137 136   Potassium 3.5 - 5.1 mmol/L 3.9 4.0 3.9  Chloride 98 - 111  mmol/L 105 102 102  CO2 22 - 32 mmol/L 27 27 26   Calcium 8.9 - 10.3 mg/dL 9.8 9.4 9.1  Total Protein 6.5 - 8.1 g/dL 8.1 7.8 7.5  Total Bilirubin 0.3 - 1.2 mg/dL 0.6 0.5 0.4  Alkaline Phos 38 - 126 U/L 88 102 93  AST 15 - 41 U/L 22 15 16   ALT 0 - 44 U/L 24 15 15     DIAGNOSTIC IMAGING:  I have independently reviewed the relevant imaging and discussed with the patient.  ASSESSMENT: 1.  Iron deficiency anemia with suspected occult GI bleed - ED on 07/18/2020 with Hgb 5.7, fatigue, lightheadedness.  Denied overt GI bleeding and was Hemoccult-negative, but does have a history of peptic ulcer disease.  She was transfused 2 units PRBCs on 07/18/2020, and followed up with gastroenterology.  - Colonoscopy (08/24/2020) showed diverticulosis, but was otherwise normal.   - She is scheduled for EGD with possible capsule placement on 09/20/20. - She is on Protonix for suspected recurrent peptic ulcer disease - Hematology work-up (07/29/2020) showed Hgb 8.5 (microcytic with MCV 78.2).  Ferritin was 7, with 32% iron saturation.  Reticulocytes elevated at 3.4 (reticulocyte index 1.61, consistent with hypoproliferation in the setting of iron deficiency).   - She received IV Venofer x 800mg  in divided doses from 08/02/2020 through 08/11/2020.   - Repeat labs (09/13/2020) - Hgb improved to 11.2, normal MCV 86.8.  Iron saturation was low at 9%, with ferritin 23. - Continues to experience fatigue but denies any overt GI blood loss at this time   PLAN:  1.  Iron deficiency anemia with suspected occult GI bleed - Hemoglobin is improved, but the patient's ferritin remains low at 23 - Recommend additional IV iron with Venofer x3 (300 mg-300 mg-400mg ) - Labs (CBC, ferritin, iron/TIBC, B12, methylmalonic acid, vitamin D, folate,) and follow-up in 3 months - Continue to follow with GI to find source of bleeding   PLAN SUMMARY & DISPOSITION: - IV iron with Venofer  x3 (300 mg-300 mg- 400mg ) - Labs (CBC, ferritin, iron/TIBC, B12, methylmalonic acid, vitamin D, folate,) and follow-up in 3 months - Continue to follow with GI to find source of bleeding   All questions were answered. The patient knows to call the clinic with any problems, questions or concerns.  Medical decision making: Moderate (1 chronic condition not yet stabilized, External gastroenterology notes, review of previous test results, ordering new tests)  Time spent on visit: I spent 15 minutes counseling the patient face to face. The total time spent in the appointment was 25 minutes and more than 50% was on counseling.   Harriett Rush, PA-C  09/15/20 11:49 AM

## 2020-09-16 ENCOUNTER — Other Ambulatory Visit (HOSPITAL_COMMUNITY)
Admission: RE | Admit: 2020-09-16 | Discharge: 2020-09-16 | Disposition: A | Discharge status: Home / Self Care | Payer: Medicare Other | Source: Ambulatory Visit | Attending: Internal Medicine | Admitting: Internal Medicine

## 2020-09-16 ENCOUNTER — Encounter (HOSPITAL_COMMUNITY): Payer: Self-pay

## 2020-09-16 ENCOUNTER — Encounter (HOSPITAL_COMMUNITY)
Admission: RE | Admit: 2020-09-16 | Discharge: 2020-09-16 | Disposition: A | Discharge status: Home / Self Care | Payer: Medicare Other | Source: Ambulatory Visit | Attending: Internal Medicine | Admitting: Internal Medicine

## 2020-09-16 ENCOUNTER — Other Ambulatory Visit: Payer: Self-pay

## 2020-09-16 DIAGNOSIS — Z20822 Contact with and (suspected) exposure to covid-19: Secondary | ICD-10-CM | POA: Diagnosis not present

## 2020-09-16 DIAGNOSIS — Z01812 Encounter for preprocedural laboratory examination: Secondary | ICD-10-CM | POA: Diagnosis not present

## 2020-09-16 HISTORY — DX: Anemia, unspecified: D64.9

## 2020-09-16 HISTORY — DX: Gastro-esophageal reflux disease without esophagitis: K21.9

## 2020-09-16 NOTE — Pre-Procedure Instructions (Signed)
Pharmacy was unable to contact patient to do med rec over the phone. I attempted X2 to call tech number [phone] and the main pharmacy [phone] and both numbers just rang without an answer. I made a copy of her med list and will attempt to call them again later.

## 2020-09-17 LAB — SARS CORONAVIRUS 2 (TAT 6-24 HRS): SARS Coronavirus 2: NEGATIVE

## 2020-09-20 ENCOUNTER — Ambulatory Visit (HOSPITAL_COMMUNITY): Payer: Medicare Other | Admitting: Anesthesiology

## 2020-09-20 ENCOUNTER — Encounter (HOSPITAL_COMMUNITY)
Admission: RE | Disposition: A | Discharge status: Home / Self Care | Payer: Self-pay | Source: Home / Self Care | Attending: Internal Medicine

## 2020-09-20 ENCOUNTER — Encounter (HOSPITAL_COMMUNITY): Payer: Self-pay | Admitting: Internal Medicine

## 2020-09-20 ENCOUNTER — Other Ambulatory Visit: Payer: Self-pay

## 2020-09-20 ENCOUNTER — Ambulatory Visit (HOSPITAL_COMMUNITY)
Admission: RE | Admit: 2020-09-20 | Discharge: 2020-09-20 | Disposition: A | Discharge status: Home / Self Care | Payer: Medicare Other | Attending: Internal Medicine | Admitting: Internal Medicine

## 2020-09-20 DIAGNOSIS — R195 Other fecal abnormalities: Secondary | ICD-10-CM

## 2020-09-20 DIAGNOSIS — Z791 Long term (current) use of non-steroidal anti-inflammatories (NSAID): Secondary | ICD-10-CM | POA: Insufficient documentation

## 2020-09-20 DIAGNOSIS — J449 Chronic obstructive pulmonary disease, unspecified: Secondary | ICD-10-CM | POA: Diagnosis not present

## 2020-09-20 DIAGNOSIS — K319 Disease of stomach and duodenum, unspecified: Secondary | ICD-10-CM | POA: Diagnosis not present

## 2020-09-20 DIAGNOSIS — Z8249 Family history of ischemic heart disease and other diseases of the circulatory system: Secondary | ICD-10-CM | POA: Diagnosis not present

## 2020-09-20 DIAGNOSIS — Z79899 Other long term (current) drug therapy: Secondary | ICD-10-CM | POA: Diagnosis not present

## 2020-09-20 DIAGNOSIS — K921 Melena: Secondary | ICD-10-CM | POA: Diagnosis not present

## 2020-09-20 DIAGNOSIS — K3189 Other diseases of stomach and duodenum: Secondary | ICD-10-CM | POA: Diagnosis not present

## 2020-09-20 DIAGNOSIS — K219 Gastro-esophageal reflux disease without esophagitis: Secondary | ICD-10-CM | POA: Insufficient documentation

## 2020-09-20 DIAGNOSIS — K259 Gastric ulcer, unspecified as acute or chronic, without hemorrhage or perforation: Secondary | ICD-10-CM | POA: Diagnosis not present

## 2020-09-20 DIAGNOSIS — K449 Diaphragmatic hernia without obstruction or gangrene: Secondary | ICD-10-CM | POA: Insufficient documentation

## 2020-09-20 DIAGNOSIS — Z801 Family history of malignant neoplasm of trachea, bronchus and lung: Secondary | ICD-10-CM | POA: Diagnosis not present

## 2020-09-20 DIAGNOSIS — D509 Iron deficiency anemia, unspecified: Secondary | ICD-10-CM

## 2020-09-20 HISTORY — PX: BIOPSY: SHX5522

## 2020-09-20 HISTORY — PX: ESOPHAGOGASTRODUODENOSCOPY (EGD) WITH PROPOFOL: SHX5813

## 2020-09-20 SURGERY — ESOPHAGOGASTRODUODENOSCOPY (EGD) WITH PROPOFOL
Anesthesia: General

## 2020-09-20 MED ORDER — LIDOCAINE HCL (CARDIAC) PF 100 MG/5ML IV SOSY
PREFILLED_SYRINGE | INTRAVENOUS | Status: DC | PRN
  Administered 2020-09-20: 50 mg via INTRAVENOUS

## 2020-09-20 MED ORDER — PROPOFOL 10 MG/ML IV BOLUS
INTRAVENOUS | Status: DC | PRN
  Administered 2020-09-20 (×2): 100 mg via INTRAVENOUS

## 2020-09-20 MED ORDER — LACTATED RINGERS IV SOLN: INTRAVENOUS | Status: DC

## 2020-09-20 NOTE — Discharge Instructions (Signed)
EGD Discharge instructions Please read the instructions outlined below and refer to this sheet in the next few weeks. These discharge instructions provide you with general information on caring for yourself after you leave the hospital. Your doctor may also give you specific instructions. While your treatment has been planned according to the most current medical practices available, unavoidable complications occasionally occur. If you have any problems or questions after discharge, please call your doctor. ACTIVITY  You may resume your regular activity but move at a slower pace for the next 24 hours.   Take frequent rest periods for the next 24 hours.   Walking will help expel (get rid of) the air and reduce the bloated feeling in your abdomen.   No driving for 24 hours (because of the anesthesia (medicine) used during the test).   You may shower.   Do not sign any important legal documents or operate any machinery for 24 hours (because of the anesthesia used during the test).  NUTRITION  Drink plenty of fluids.   You may resume your normal diet.   Begin with a light meal and progress to your normal diet.   Avoid alcoholic beverages for 24 hours or as instructed by your caregiver.  MEDICATIONS  You may resume your normal medications unless your caregiver tells you otherwise.  WHAT YOU CAN EXPECT TODAY  You may experience abdominal discomfort such as a feeling of fullness or "gas" pains.  FOLLOW-UP  Your doctor will discuss the results of your test with you.  SEEK IMMEDIATE MEDICAL ATTENTION IF ANY OF THE FOLLOWING OCCUR:  Excessive nausea (feeling sick to your stomach) and/or vomiting.   Severe abdominal pain and distention (swelling).   Trouble swallowing.   Temperature over 101 F (37.8 C).   Rectal bleeding or vomiting of blood.    Your stomach is inflamed.  Biopsies taken.  You may be losing blood from your stomach from the effects of Voltaren.  Any NSAID can do  this like Aleve and Advil and ibuprofen.  It is best to avoid all of these type medications  Increase Protonix to 40 mg twice daily before breakfast and supper new prescription provided  Office visit with [place] in 4 weeks  Further recommendations to follow pending review of pathology report  At patient request, I called Hassan Rowan at [phone] answer.

## 2020-09-20 NOTE — Op Note (Signed)
[facility] Patient Name: Brittany Durham Procedure Date: 09/20/2020 10:58 AM MRN: [id number] Date of Birth: 08-Dec-1970 Attending MD: Gennette Pac , MD CSN: [id number] Age: 50 Admit Type: Outpatient Procedure:                Upper GI endoscopy Indications:              Unexplained iron deficiency anemia, Heme positive                            stool Providers:                Gennette Pac, MD, Jannett Celestine, RN, Lafonda Mosses, Technician Referring MD:              Medicines:                Propofol per Anesthesia Complications:            No immediate complications. Estimated Blood Loss:     Estimated blood loss was minimal. Procedure:                Pre-Anesthesia Assessment:                           - Prior to the procedure, a History and Physical                            was performed, and patient medications and                            allergies were reviewed. The patient's tolerance of                            previous anesthesia was also reviewed. The risks                            and benefits of the procedure and the sedation                            options and risks were discussed with the patient.                            All questions were answered, and informed consent                            was obtained. Prior Anticoagulants: The patient has                            taken no previous anticoagulant or antiplatelet                            agents. ASA Grade Assessment: III - A patient with  severe systemic disease. After reviewing the risks                            and benefits, the patient was deemed in                            satisfactory condition to undergo the procedure.                           After obtaining informed consent, the endoscope was                            passed under direct vision. Throughout the                            procedure, the  patient's blood pressure, pulse, and                            oxygen saturations were monitored continuously. The                            [id number]) was introduced through the mouth,                            and advanced to the second part of duodenum. The                            upper GI endoscopy was accomplished without                            difficulty. The patient tolerated the procedure                            well. Scope In: 11:25:40 AM Scope Out: 11:30:04 AM Total Procedure Duration: 0 hours 4 minutes 24 seconds  Findings:      The examined esophagus was normal.      A large hiatal hernia was present. Cameron lesions and antral erosions.       Status post biopsy. No ulcer or infiltrating process.      The duodenal bulb and second portion of the duodenum were normal.       Capsule not deployed today. Patient has continued on iron Impression:               - Normal esophagus.                           - Large hiatal hernia. Multiple gastric erosions                            (Cameron lesions)                           - Normal duodenal bulb and second portion of the  duodenum.                           - No specimens collected. I am suspicious of                            Sheria Lang lesions causing intermittent bleeding in                            the setting of ongoing NSAID use (Voltaren). No                            capsule deployment today Moderate Sedation:      Moderate (conscious) sedation was personally administered by an       anesthesia professional. The following parameters were monitored: oxygen       saturation, heart rate, blood pressure, respiratory rate, EKG, adequacy       of pulmonary ventilation, and response to care. Recommendation:           - Patient has a contact number available for                            emergencies. The signs and symptoms of potential                            delayed  complications were discussed with the                            patient. Return to normal activities tomorrow.                            Written discharge instructions were provided to the                            patient.                           - Resume previous diet.                           - Continue present medications.                           - Return to my office in 4 weeks. Increase Protonix                            to 40 mg before meals and at bedtime twice daily.                            Stop all NSAIDs. Review path. Further                            recommendations to follow. Procedure Code(s):        --- Professional ---  16109, Esophagogastroduodenoscopy, flexible,                            transoral; diagnostic, including collection of                            specimen(s) by brushing or washing, when performed                            (separate procedure) Diagnosis Code(s):        --- Professional ---                           K44.9, Diaphragmatic hernia without obstruction or                            gangrene                           D50.9, Iron deficiency anemia, unspecified                           R19.5, Other fecal abnormalities CPT copyright 2019 American Medical Association. All rights reserved. The codes documented in this report are preliminary and upon coder review may  be revised to meet current compliance requirements. Gerrit Friends. Camira Geidel, MD Gennette Pac, MD 09/20/2020 11:41:14 AM This report has been signed electronically. Number of Addenda: 0

## 2020-09-20 NOTE — Anesthesia Preprocedure Evaluation (Addendum)
Anesthesia Evaluation  Patient identified by MRN, date of birth, ID band Patient awake    Reviewed: Allergy & Precautions, NPO status , Patient's Chart, lab work & pertinent test results  Airway Mallampati: III  TM Distance: >3 FB Neck ROM: Full   Comment: Cervical spinal fusion Dental  (+) Dental Advisory Given, Chipped,    Pulmonary asthma , COPD,    Pulmonary exam normal breath sounds clear to auscultation       Cardiovascular Exercise Tolerance: Poor hypertension, Pt. on medications  Rhythm:Regular Rate:Tachycardia  Left ventricle: The cavity size was normal. Wall thickness was  normal. Systolic function was normal. The estimated ejection  fraction was in the range of 60% to 65%. Wall motion was normal;  there were no regional wall motion abnormalities. Doppler  parameters are consistent with abnormal left ventricular  relaxation (grade 1 diastolic dysfunction).  - Aortic valve: Valve area (VTI): 1.62 cm^2. Valve area (Vmax):  1.62 cm^2.  - Technically adequate study.    Neuro/Psych Seizures -,  PSYCHIATRIC DISORDERS Anxiety Depression CVA, Residual Symptoms    GI/Hepatic Neg liver ROS, PUD, GERD  Medicated and Controlled,  Endo/Other  negative endocrine ROS  Renal/GU negative Renal ROS     Musculoskeletal  (+) Arthritis  (cervical spinal fusion),   Abdominal   Peds  Hematology  (+) anemia ,   Anesthesia Other Findings Speech disorder   Reproductive/Obstetrics                           Anesthesia Physical Anesthesia Plan  ASA: III  Anesthesia Plan: General   Post-op Pain Management:    Induction: Intravenous  PONV Risk Score and Plan: Propofol infusion  Airway Management Planned: Nasal Cannula and Natural Airway  Additional Equipment:   Intra-op Plan:   Post-operative Plan:   Informed Consent: I have reviewed the patients History and Physical, chart, labs  and discussed the procedure including the risks, benefits and alternatives for the proposed anesthesia with the patient or authorized representative who has indicated his/her understanding and acceptance.     Dental advisory given  Plan Discussed with: CRNA and Surgeon  Anesthesia Plan Comments:        Anesthesia Quick Evaluation

## 2020-09-20 NOTE — Anesthesia Procedure Notes (Signed)
Date/Time: 09/20/2020 11:29 AM Performed by: Orlie Dakin, CRNA Pre-anesthesia Checklist: Patient identified, Emergency Drugs available, Suction available and Patient being monitored Patient Re-evaluated:Patient Re-evaluated prior to induction Oxygen Delivery Method: Nasal cannula Induction Type: IV induction Placement Confirmation: positive ETCO2

## 2020-09-20 NOTE — Transfer of Care (Signed)
Immediate Anesthesia Transfer of Care Note  Patient: Brittany Durham  Procedure(s) Performed: ESOPHAGOGASTRODUODENOSCOPY (EGD) WITH PROPOFOL (N/A ) BIOPSY  Patient Location: Short Stay  Anesthesia Type:General  Level of Consciousness: awake  Airway & Oxygen Therapy: Patient Spontanous Breathing  Post-op Assessment: Report given to RN and Post -op Vital signs reviewed and stable  Post vital signs: Reviewed and stable  Last Vitals:  Vitals Value Taken Time  BP    Temp    Pulse    Resp    SpO2      Last Pain:  Vitals:   09/20/20 0947  TempSrc: Oral  PainSc: 0-No pain         Complications: No complications documented.

## 2020-09-20 NOTE — Anesthesia Postprocedure Evaluation (Signed)
Anesthesia Post Note  Patient: Larua Collier  Procedure(s) Performed: ESOPHAGOGASTRODUODENOSCOPY (EGD) WITH PROPOFOL (N/A ) BIOPSY  Patient location during evaluation: Phase II Anesthesia Type: General Level of consciousness: awake and alert and oriented Pain management: pain level controlled Vital Signs Assessment: post-procedure vital signs reviewed and stable Respiratory status: spontaneous breathing and respiratory function stable Cardiovascular status: blood pressure returned to baseline and stable Postop Assessment: no apparent nausea or vomiting Anesthetic complications: no   No complications documented.   Last Vitals:  Vitals:   09/20/20 0947 09/20/20 1138  BP: (!) 150/94 (!) 139/56  Pulse: (!) 113 (!) 112  Resp: (!) 24 17  Temp: 37 C 36.9 C  SpO2: 98% 96%    Last Pain:  Vitals:   09/20/20 1138  TempSrc: Oral  PainSc: 0-No pain                 Adeja Sarratt C Madalynn Pickelsimer

## 2020-09-20 NOTE — H&P (Signed)
@LOGO @   Primary Care Physician:  Sharion Balloon, FNP Primary Gastroenterologist:  Dr. Gala Romney  Pre-Procedure History & Physical: HPI:  Brittany Durham is a 50 y.o. female here for further evaluation of iron deficiency anemia.  Recent colonoscopy revealed only diverticulosis attempted EGD patient not cooperative.  Plan is to perform EGD.  Patient did not stop her iron, however.  She did continue taking iron through yesterday.   Past Medical History:  Diagnosis Date  . Anemia   . Asthma    childhood  . Carotidynia   . COPD (chronic obstructive pulmonary disease) ([place])   . DDD (degenerative disc disease), cervical   . GERD (gastroesophageal reflux disease)   . Hypertension   . Knee pain    left meniscus tear s/p repair 11/2014  . Seizures ([place])    last seizure was over 10 years ago, unknown etiology  . Stuttering    patient reports this started after her knee surgery in 11/2014.   Marland Kitchen Thyroid nodule     Past Surgical History:  Procedure Laterality Date  . ANTERIOR CERVICAL DECOMP/DISCECTOMY FUSION N/A 03/20/2019   Procedure: Anterior Cervical Decompression Fusion - Cervical Four-Cervical Five - Cervical Five-Cervical Six;  Surgeon: Eustace Moore, MD;  Location: [facility];  Service: Neurosurgery;  Laterality: N/A;  Anterior Cervical Decompression Fusion - Cervical Four-Cervical Five - Cervical Five-Cervical Six  . COLONOSCOPY N/A 08/23/2016   Procedure: COLONOSCOPY;  Surgeon: Daneil Dolin, MD;  [facility];  Service: Endoscopy;  Laterality: N/A;  1:00pm  . COLONOSCOPY N/A 08/24/2020   Procedure: COLONOSCOPY;  Surgeon: Daneil Dolin, MD;  [facility];  Service: Endoscopy;  Laterality: N/A;  AM or PM (early as possible as has to have transportation)  . ESOPHAGOGASTRODUODENOSCOPY N/A 05/03/2016   Procedure: ESOPHAGOGASTRODUODENOSCOPY (EGD);  Surgeon: Daneil Dolin, MD;  [facility];  Service: Endoscopy;  Laterality: N/A;  . ESOPHAGOGASTRODUODENOSCOPY N/A  08/23/2016   Procedure: ESOPHAGOGASTRODUODENOSCOPY (EGD);  Surgeon: Daneil Dolin, MD;  [facility];  Service: Endoscopy;  Laterality: N/A;  . KNEE ARTHROSCOPY Right   . KNEE ARTHROSCOPY WITH MEDIAL MENISECTOMY Left 12/09/2014   Procedure: KNEE ARTHROSCOPY WITH PARTIAL MEDIAL AND LATERAL MENISECTOMY;  Surgeon: Sanjuana Kava, MD;  Location: [facility];  Service: Orthopedics;  Laterality: Left;  . TUBAL LIGATION    . WISDOM TOOTH EXTRACTION      Prior to Admission medications   Medication Sig Start Date End Date Taking? Authorizing Provider  amLODipine (NORVASC) 10 MG tablet TAKE 1 TABLET ONCE DAILY Patient taking differently: Take 10 mg by mouth daily. 04/01/20  Yes Hawks, Christy A, FNP  baclofen (LIORESAL) 10 MG tablet TAKE  (1)  TABLET  THREE TIMES DAILY. Patient taking differently: Take 10 mg by mouth 3 (three) times daily. 07/19/20  Yes Hawks, Christy A, FNP  busPIRone (BUSPAR) 5 MG tablet Take 1 tablet (5 mg total) by mouth 3 (three) times daily. 07/15/20  Yes Ivy Lynn, NP  diclofenac (VOLTAREN) 75 MG EC tablet Take 75 mg by mouth daily. 05/21/19  Yes [provider]  docusate sodium (COLACE) 250 MG capsule Take 250 mg by mouth daily.   Yes [provider]  escitalopram (LEXAPRO) 20 MG tablet Take 1 tablet (20 mg total) by mouth daily. 07/15/20  Yes Ivy Lynn, NP  ferrous sulfate 325 (65 FE) MG tablet Take 1 tablet (325 mg total) by mouth daily. 07/18/20  Yes Marcello Fennel, PA-C  lisinopril (  ZESTRIL) 20 MG tablet TAKE 1 TABLET DAILY Patient taking differently: Take 20 mg by mouth daily. 04/01/20  Yes Hawks, Christy A, FNP  meclizine (ANTIVERT) 25 MG tablet Take 1 tablet (25 mg total) by mouth 3 (three) times daily as needed for dizziness. 01/15/20  Yes Milton Ferguson, MD  pantoprazole (PROTONIX) 40 MG tablet TAKE 1 TABLET ONCE DAILY BEFORE BREAKFAST Patient taking differently: Take 40 mg by mouth daily. 03/23/20  Yes Hawks, Christy A, FNP  Vitamin D,  Ergocalciferol, (DRISDOL) 1.25 MG (50000 UNIT) CAPS capsule Take 50,000 Units by mouth once a week. 05/29/19  Yes [provider]  CLENPIQ 10-3.5-12 MG-GM -GM/160ML SOLN SMARTSIG:320 Milliliter(s) By Mouth As Directed 07/21/20   [provider]    Allergies as of 08/31/2020  . (No Known Allergies)    Family History  Problem Relation Age of Onset  . Hypertension Mother   . Lung cancer Father   . Hypertension Brother   . Colon cancer Neg Hx   . Gastric cancer Neg Hx   . Esophageal cancer Neg Hx     Social History   Socioeconomic History  . Marital status: Single    Spouse name: Not on file  . Number of children: 1  . Years of education: 28  . Highest education level: Not on file  Occupational History  . Occupation: Umemployed    Comment: Disability  Tobacco Use  . Smoking status: Never Smoker  . Smokeless tobacco: Never Used  Vaping Use  . Vaping Use: Never used  Substance and Sexual Activity  . Alcohol use: No  . Drug use: No  . Sexual activity: Not Currently    Birth control/protection: Surgical  Other Topics Concern  . Not on file  Social History Narrative   Lives at home alone.   Right-handed.   Occasional caffeine use.   Social Determinants of Health   Financial Resource Strain: Not on file  Food Insecurity: Not on file  Transportation Needs: Not on file  Physical Activity: Not on file  Stress: Not on file  Social Connections: Not on file  Intimate Partner Violence: Not on file    Review of Systems: See HPI, otherwise negative ROS  Physical Exam: BP (!) 150/94   Pulse (!) 113   Temp 98.6 F (37 C) (Oral)   Resp (!) 24   Ht 5\' 7"  (1.702 m)   Wt 104.3 kg   LMP 02/18/2019   SpO2 98%   BMI 36.02 kg/m  General:   Alert,  Well-developed, well-nourished, pleasant and cooperative in NAD Neck:  Supple; no masses or thyromegaly. No significant cervical adenopathy. Lungs:  Clear throughout to auscultation.   No wheezes, crackles, or  rhonchi. No acute distress. Heart:  Regular rate and rhythm; no murmurs, clicks, rubs,  or gallops. Abdomen: Non-distended, normal bowel sounds.  Soft and nontender without appreciable mass or hepatosplenomegaly.  Pulses:  Normal pulses noted. Extremities:  Without clubbing or edema.  Impression/Plan:  50 year old lady with microcytic anemia.  Chronically dark stools on iron.  Patient continue taking iron 3 yesterday.  Therefore, capsule will not be deployed today.  However she will undergo an updated EGD.  She denies dysphagia.  The risks, benefits, limitations, alternatives and imponderables have been reviewed with the patient. Potential for esophageal dilation, biopsy, etc. have also been reviewed.  Questions have been answered. All parties agreeable.     Notice: This dictation was prepared with Dragon dictation along with smaller phrase technology. Any transcriptional errors  that result from this process are unintentional and may not be corrected upon review.

## 2020-09-21 ENCOUNTER — Ambulatory Visit (HOSPITAL_COMMUNITY): Payer: Medicaid Other

## 2020-09-21 ENCOUNTER — Encounter: Payer: Self-pay | Admitting: Internal Medicine

## 2020-09-21 LAB — SURGICAL PATHOLOGY

## 2020-09-22 ENCOUNTER — Ambulatory Visit (HOSPITAL_COMMUNITY): Payer: Medicaid Other

## 2020-09-23 ENCOUNTER — Ambulatory Visit (HOSPITAL_COMMUNITY): Payer: Medicaid Other

## 2020-09-24 DIAGNOSIS — S39012A Strain of muscle, fascia and tendon of lower back, initial encounter: Secondary | ICD-10-CM | POA: Diagnosis not present

## 2020-09-24 DIAGNOSIS — N3001 Acute cystitis with hematuria: Secondary | ICD-10-CM | POA: Diagnosis not present

## 2020-09-24 DIAGNOSIS — X58XXXA Exposure to other specified factors, initial encounter: Secondary | ICD-10-CM | POA: Diagnosis not present

## 2020-09-26 ENCOUNTER — Inpatient Hospital Stay (HOSPITAL_COMMUNITY): Payer: Medicare Other

## 2020-09-26 ENCOUNTER — Encounter (HOSPITAL_COMMUNITY): Payer: Self-pay | Admitting: Internal Medicine

## 2020-09-26 ENCOUNTER — Other Ambulatory Visit: Payer: Self-pay

## 2020-09-26 VITALS — BP 107/66 | HR 97 | Temp 97.2°F | Resp 18

## 2020-09-26 DIAGNOSIS — D500 Iron deficiency anemia secondary to blood loss (chronic): Secondary | ICD-10-CM

## 2020-09-26 DIAGNOSIS — D62 Acute posthemorrhagic anemia: Secondary | ICD-10-CM

## 2020-09-26 DIAGNOSIS — D509 Iron deficiency anemia, unspecified: Secondary | ICD-10-CM | POA: Diagnosis not present

## 2020-09-26 MED ORDER — EPINEPHRINE 0.3 MG/0.3ML IJ SOAJ: 0.3000 mg | Freq: Once | INTRAMUSCULAR | Status: DC | PRN

## 2020-09-26 MED ORDER — SODIUM CHLORIDE 0.9 % IV SOLN: Freq: Once | INTRAVENOUS | Status: DC | PRN

## 2020-09-26 MED ORDER — FAMOTIDINE 20 MG IN NS 100 ML IVPB
20.0000 mg | Freq: Once | INTRAVENOUS | Status: DC | PRN
  Filled 2020-09-26: qty 100

## 2020-09-26 MED ORDER — SODIUM CHLORIDE 0.9 % IV SOLN
Freq: Once | INTRAVENOUS | Status: AC
Start: 2020-09-26 — End: 2020-09-26

## 2020-09-26 MED ORDER — ALBUTEROL SULFATE (2.5 MG/3ML) 0.083% IN NEBU: 2.5000 mg | INHALATION_SOLUTION | Freq: Once | RESPIRATORY_TRACT | Status: DC | PRN

## 2020-09-26 MED ORDER — METHYLPREDNISOLONE SODIUM SUCC 125 MG IJ SOLR: 125.0000 mg | Freq: Once | INTRAMUSCULAR | Status: DC | PRN

## 2020-09-26 MED ORDER — DIPHENHYDRAMINE HCL 50 MG/ML IJ SOLN: 50.0000 mg | Freq: Once | INTRAMUSCULAR | Status: DC | PRN

## 2020-09-26 MED ORDER — SODIUM CHLORIDE 0.9 % IV SOLN
300.0000 mg | Freq: Once | INTRAVENOUS | Status: AC
  Administered 2020-09-26: 300 mg via INTRAVENOUS
  Filled 2020-09-26: qty 300

## 2020-09-26 NOTE — Progress Notes (Signed)
Tolerated iron infusion without incidence today.  Stable during and after infusion.  AVS reviewed.  Vital signs stable prior to discharge.  Discharged in stable condition ambulatory.

## 2020-09-26 NOTE — Patient Instructions (Signed)
[place]  Discharge Instructions: Thank you for choosing [facility] to provide your oncology and hematology care.  If you have a lab appointment with the [facility], please come in thru the Main Entrance and check in at the main information desk.  Wear comfortable clothing and clothing appropriate for easy access to any Portacath or PICC line.   We strive to give you quality time with your provider. You may need to reschedule your appointment if you arrive late (15 or more minutes).  Arriving late affects you and other patients whose appointments are after yours.  Also, if you miss three or more appointments without notifying the office, you may be dismissed from the clinic at the provider's discretion.      For prescription refill requests, have your pharmacy contact our office and allow 72 hours for refills to be completed.    Iron infusion today.      To help prevent nausea and vomiting after your treatment, we encourage you to take your nausea medication as directed.  BELOW ARE SYMPTOMS THAT SHOULD BE REPORTED IMMEDIATELY: . *FEVER GREATER THAN 100.4 F (38 C) OR HIGHER . *CHILLS OR SWEATING . *NAUSEA AND VOMITING THAT IS NOT CONTROLLED WITH YOUR NAUSEA MEDICATION . *UNUSUAL SHORTNESS OF BREATH . *UNUSUAL BRUISING OR BLEEDING . *URINARY PROBLEMS (pain or burning when urinating, or frequent urination) . *BOWEL PROBLEMS (unusual diarrhea, constipation, pain near the anus) . TENDERNESS IN MOUTH AND THROAT WITH OR WITHOUT PRESENCE OF ULCERS (sore throat, sores in mouth, or a toothache) . UNUSUAL RASH, SWELLING OR PAIN  . UNUSUAL VAGINAL DISCHARGE OR ITCHING   Items with * indicate a potential emergency and should be followed up as soon as possible or go to the Emergency Department if any problems should occur.  Please show the CHEMOTHERAPY ALERT CARD or IMMUNOTHERAPY ALERT CARD at check-in to the Emergency Department and triage nurse.  Should you have  questions after your visit or need to cancel or reschedule your appointment, please contact [facility] [phone]  and follow the prompts.  Office hours are 8:00 a.m. to 4:30 p.m. Monday - Friday. Please note that voicemails left after 4:00 p.m. may not be returned until the following business day.  We are closed weekends and major holidays. You have access to a nurse at all times for urgent questions. Please call the main number to the clinic [phone] and follow the prompts.  For any non-urgent questions, you may also contact your provider using MyChart. We now offer e-Visits for anyone 41 and older to request care online for non-urgent symptoms. For details visit [email or web address].   Also download the MyChart app! Go to the app store, search "MyChart", open the app, select [facility], and log in with your MyChart username and password.  Due to Covid, a mask is required upon entering the hospital/clinic. If you do not have a mask, one will be given to you upon arrival. For doctor visits, patients may have 1 support person aged 46 or older with them. For treatment visits, patients cannot have anyone with them due to current Covid guidelines and our immunocompromised population.

## 2020-09-28 ENCOUNTER — Encounter: Payer: Self-pay | Admitting: Family Medicine

## 2020-09-30 ENCOUNTER — Ambulatory Visit (HOSPITAL_COMMUNITY): Payer: Medicaid Other

## 2020-09-30 ENCOUNTER — Inpatient Hospital Stay (HOSPITAL_COMMUNITY): Payer: Medicare Other

## 2020-09-30 ENCOUNTER — Other Ambulatory Visit: Payer: Self-pay

## 2020-09-30 VITALS — BP 127/72 | HR 85 | Temp 97.9°F | Resp 18

## 2020-09-30 DIAGNOSIS — D62 Acute posthemorrhagic anemia: Secondary | ICD-10-CM

## 2020-09-30 DIAGNOSIS — D509 Iron deficiency anemia, unspecified: Secondary | ICD-10-CM | POA: Diagnosis not present

## 2020-09-30 DIAGNOSIS — D500 Iron deficiency anemia secondary to blood loss (chronic): Secondary | ICD-10-CM

## 2020-09-30 MED ORDER — SODIUM CHLORIDE 0.9 % IV SOLN
300.0000 mg | Freq: Once | INTRAVENOUS | Status: AC
  Administered 2020-09-30: 300 mg via INTRAVENOUS
  Filled 2020-09-30: qty 300

## 2020-09-30 MED ORDER — SODIUM CHLORIDE 0.9 % IV SOLN: 400.0000 mg | Freq: Once | INTRAVENOUS | Status: DC

## 2020-09-30 MED ORDER — SODIUM CHLORIDE 0.9 % IV SOLN: Freq: Once | INTRAVENOUS | Status: AC

## 2020-09-30 NOTE — Progress Notes (Signed)
Iron infusion given per orders. Patient tolerated it well without problems. Vitals stable and discharged home from clinic ambulatory. Follow up as scheduled.  

## 2020-09-30 NOTE — Patient Instructions (Signed)
[facility]  Discharge Instructions: Thank you for choosing [facility] to provide your oncology and hematology care.  If you have a lab appointment with the Cancer Center, please come in thru the Main Entrance and check in at the main information desk.  Wear comfortable clothing and clothing appropriate for easy access to any Portacath or PICC line.   We strive to give you quality time with your provider. You may need to reschedule your appointment if you arrive late (15 or more minutes).  Arriving late affects you and other patients whose appointments are after yours.  Also, if you miss three or more appointments without notifying the office, you may be dismissed from the clinic at the provider's discretion.      For prescription refill requests, have your pharmacy contact our office and allow 72 hours for refills to be completed.         Items with * indicate a potential emergency and should be followed up as soon as possible or go to the Emergency Department if any problems should occur.    Should you have questions after your visit or need to cancel or reschedule your appointment, please contact [facility] [phone]  and follow the prompts.  Office hours are 8:00 a.m. to 4:30 p.m. Monday - Friday. Please note that voicemails left after 4:00 p.m. may not be returned until the following business day.  We are closed weekends and major holidays. You have access to a nurse at all times for urgent questions. Please call the main number to the clinic [phone] and follow the prompts.  For any non-urgent questions, you may also contact your provider using MyChart. We now offer e-Visits for anyone 18 and older to request care online for non-urgent symptoms. For details visit [email or web address].   Also download the MyChart app! Go to the app store, search "MyChart", open the app, select [facility], and log in with your MyChart username and  password.  Due to Covid, a mask is required upon entering the hospital/clinic. If you do not have a mask, one will be given to you upon arrival. For doctor visits, patients may have 1 support person aged 18 or older with them. For treatment visits, patients cannot have anyone with them due to current Covid guidelines and our immunocompromised population.  

## 2020-10-03 ENCOUNTER — Inpatient Hospital Stay (HOSPITAL_COMMUNITY): Payer: Medicare Other

## 2020-10-03 ENCOUNTER — Other Ambulatory Visit: Payer: Self-pay

## 2020-10-03 ENCOUNTER — Encounter (HOSPITAL_COMMUNITY): Payer: Self-pay

## 2020-10-03 VITALS — BP 116/62 | HR 84 | Temp 97.0°F | Resp 18

## 2020-10-03 DIAGNOSIS — D500 Iron deficiency anemia secondary to blood loss (chronic): Secondary | ICD-10-CM

## 2020-10-03 DIAGNOSIS — D509 Iron deficiency anemia, unspecified: Secondary | ICD-10-CM | POA: Diagnosis not present

## 2020-10-03 DIAGNOSIS — D62 Acute posthemorrhagic anemia: Secondary | ICD-10-CM

## 2020-10-03 MED ORDER — SODIUM CHLORIDE 0.9 % IV SOLN: Freq: Once | INTRAVENOUS | Status: AC

## 2020-10-03 MED ORDER — SODIUM CHLORIDE 0.9 % IV SOLN
400.0000 mg | Freq: Once | INTRAVENOUS | Status: AC
  Administered 2020-10-03: 400 mg via INTRAVENOUS
  Filled 2020-10-03: qty 20

## 2020-10-03 NOTE — Progress Notes (Signed)
Venofer infusion given today per MD orders.  Stable during infusion without adverse affects.  Vital signs stable.  Patient declined to wait the thirty minute post infusion wait time stating that her ride was here.  No complaints at this time.  Discharge from clinic ambulatory in stable condition.  Alert and oriented X 3.  Follow up with [facility] as scheduled.

## 2020-10-03 NOTE — Progress Notes (Signed)
Patient presents today for Venofer 400mg  infusion. Vital signs stable. Patient has no complaints of any changes today. Patient states her energy levels have improved with the last infusion per patient's words.

## 2020-10-03 NOTE — Patient Instructions (Signed)
[facility]  Discharge Instructions: Thank you for choosing [facility] to provide your oncology and hematology care.  If you have a lab appointment with the Cancer Center, please come in thru the Main Entrance and check in at the main information desk.  Wear comfortable clothing and clothing appropriate for easy access to any Portacath or PICC line.   We strive to give you quality time with your provider. You may need to reschedule your appointment if you arrive late (15 or more minutes).  Arriving late affects you and other patients whose appointments are after yours.  Also, if you miss three or more appointments without notifying the office, you may be dismissed from the clinic at the provider's discretion.      For prescription refill requests, have your pharmacy contact our office and allow 72 hours for refills to be completed.    Today you received Venofer infusion.     To help prevent nausea and vomiting after your treatment, we encourage you to take your nausea medication as directed.  BELOW ARE SYMPTOMS THAT SHOULD BE REPORTED IMMEDIATELY: . *FEVER GREATER THAN 100.4 F (38 C) OR HIGHER . *CHILLS OR SWEATING . *NAUSEA AND VOMITING THAT IS NOT CONTROLLED WITH YOUR NAUSEA MEDICATION . *UNUSUAL SHORTNESS OF BREATH . *UNUSUAL BRUISING OR BLEEDING . *URINARY PROBLEMS (pain or burning when urinating, or frequent urination) . *BOWEL PROBLEMS (unusual diarrhea, constipation, pain near the anus) . TENDERNESS IN MOUTH AND THROAT WITH OR WITHOUT PRESENCE OF ULCERS (sore throat, sores in mouth, or a toothache) . UNUSUAL RASH, SWELLING OR PAIN  . UNUSUAL VAGINAL DISCHARGE OR ITCHING   Items with * indicate a potential emergency and should be followed up as soon as possible or go to the Emergency Department if any problems should occur.  Please show the CHEMOTHERAPY ALERT CARD or IMMUNOTHERAPY ALERT CARD at check-in to the Emergency Department and triage  nurse.  Should you have questions after your visit or need to cancel or reschedule your appointment, please contact [facility] [phone]  and follow the prompts.  Office hours are 8:00 a.m. to 4:30 p.m. Monday - Friday. Please note that voicemails left after 4:00 p.m. may not be returned until the following business day.  We are closed weekends and major holidays. You have access to a nurse at all times for urgent questions. Please call the main number to the clinic [phone] and follow the prompts.  For any non-urgent questions, you may also contact your provider using MyChart. We now offer e-Visits for anyone 18 and older to request care online for non-urgent symptoms. For details visit [email or web address].   Also download the MyChart app! Go to the app store, search "MyChart", open the app, select [facility], and log in with your MyChart username and password.  Due to Covid, a mask is required upon entering the hospital/clinic. If you do not have a mask, one will be given to you upon arrival. For doctor visits, patients may have 1 support person aged 18 or older with them. For treatment visits, patients cannot have anyone with them due to current Covid guidelines and our immunocompromised population.  

## 2020-10-04 ENCOUNTER — Encounter (HOSPITAL_COMMUNITY): Payer: Self-pay | Admitting: *Deleted

## 2020-10-04 ENCOUNTER — Other Ambulatory Visit: Payer: Self-pay

## 2020-10-04 DIAGNOSIS — Z79899 Other long term (current) drug therapy: Secondary | ICD-10-CM | POA: Insufficient documentation

## 2020-10-04 DIAGNOSIS — J45909 Unspecified asthma, uncomplicated: Secondary | ICD-10-CM | POA: Diagnosis not present

## 2020-10-04 DIAGNOSIS — K5732 Diverticulitis of large intestine without perforation or abscess without bleeding: Secondary | ICD-10-CM | POA: Diagnosis not present

## 2020-10-04 DIAGNOSIS — K219 Gastro-esophageal reflux disease without esophagitis: Secondary | ICD-10-CM | POA: Diagnosis not present

## 2020-10-04 DIAGNOSIS — I10 Essential (primary) hypertension: Secondary | ICD-10-CM | POA: Diagnosis not present

## 2020-10-04 DIAGNOSIS — J449 Chronic obstructive pulmonary disease, unspecified: Secondary | ICD-10-CM | POA: Diagnosis not present

## 2020-10-04 DIAGNOSIS — R109 Unspecified abdominal pain: Secondary | ICD-10-CM | POA: Diagnosis not present

## 2020-10-04 DIAGNOSIS — R5381 Other malaise: Secondary | ICD-10-CM | POA: Diagnosis not present

## 2020-10-04 DIAGNOSIS — R52 Pain, unspecified: Secondary | ICD-10-CM | POA: Diagnosis not present

## 2020-10-04 DIAGNOSIS — R000 Tachycardia, unspecified: Secondary | ICD-10-CM | POA: Diagnosis not present

## 2020-10-04 DIAGNOSIS — I959 Hypotension, unspecified: Secondary | ICD-10-CM | POA: Diagnosis not present

## 2020-10-04 NOTE — ED Triage Notes (Signed)
Pt right flank pain for a week, states urine is dark in color.  Denies any burning from urination. Denies N/V or fever.

## 2020-10-05 ENCOUNTER — Emergency Department (HOSPITAL_COMMUNITY): Payer: Medicare Other

## 2020-10-05 ENCOUNTER — Emergency Department (HOSPITAL_COMMUNITY)
Admission: EM | Admit: 2020-10-05 | Discharge: 2020-10-05 | Disposition: A | Discharge status: Home / Self Care | Payer: Medicare Other | Attending: Emergency Medicine | Admitting: Emergency Medicine

## 2020-10-05 DIAGNOSIS — K5732 Diverticulitis of large intestine without perforation or abscess without bleeding: Secondary | ICD-10-CM | POA: Diagnosis not present

## 2020-10-05 DIAGNOSIS — R109 Unspecified abdominal pain: Secondary | ICD-10-CM | POA: Diagnosis not present

## 2020-10-05 DIAGNOSIS — K5792 Diverticulitis of intestine, part unspecified, without perforation or abscess without bleeding: Secondary | ICD-10-CM

## 2020-10-05 LAB — URINALYSIS, ROUTINE W REFLEX MICROSCOPIC
Bilirubin Urine: NEGATIVE
Glucose, UA: NEGATIVE mg/dL
Hgb urine dipstick: NEGATIVE
Ketones, ur: 5 mg/dL — AB
Leukocytes,Ua: NEGATIVE
Nitrite: NEGATIVE
Protein, ur: 30 mg/dL — AB
Specific Gravity, Urine: 1.028 (ref 1.005–1.030)
pH: 5 (ref 5.0–8.0)

## 2020-10-05 LAB — COMPREHENSIVE METABOLIC PANEL
ALT: 19 U/L (ref 0–44)
AST: 14 U/L — ABNORMAL LOW (ref 15–41)
Albumin: 3.9 g/dL (ref 3.5–5.0)
Alkaline Phosphatase: 92 U/L (ref 38–126)
Anion gap: 8 (ref 5–15)
BUN: 20 mg/dL (ref 6–20)
CO2: 27 mmol/L (ref 22–32)
Calcium: 9.1 mg/dL (ref 8.9–10.3)
Chloride: 103 mmol/L (ref 98–111)
Creatinine, Ser: 0.92 mg/dL (ref 0.44–1.00)
GFR, Estimated: >60 mL/min (ref >60)
Glucose, Bld: 110 mg/dL — ABNORMAL HIGH (ref 70–99)
Potassium: 3.7 mmol/L (ref 3.5–5.1)
Sodium: 138 mmol/L (ref 135–145)
Total Bilirubin: 0.3 mg/dL (ref 0.3–1.2)
Total Protein: 7.4 g/dL (ref 6.5–8.1)

## 2020-10-05 LAB — CBC WITH DIFFERENTIAL/PLATELET
Abs Immature Granulocytes: 0.07 10*3/uL (ref 0.00–0.07)
Basophils Absolute: 0 10*3/uL (ref 0.0–0.1)
Basophils Relative: 0 %
Eosinophils Absolute: 0.2 10*3/uL (ref 0.0–0.5)
Eosinophils Relative: 2 %
HCT: 33.9 % — ABNORMAL LOW (ref 36.0–46.0)
Hemoglobin: 10.5 g/dL — ABNORMAL LOW (ref 12.0–15.0)
Immature Granulocytes: 1 %
Lymphocytes Relative: 20 %
Lymphs Abs: 2.1 10*3/uL (ref 0.7–4.0)
MCH: 28.3 pg (ref 26.0–34.0)
MCHC: 31 g/dL (ref 30.0–36.0)
MCV: 91.4 fL (ref 80.0–100.0)
Monocytes Absolute: 0.8 10*3/uL (ref 0.1–1.0)
Monocytes Relative: 8 %
Neutro Abs: 7.2 10*3/uL (ref 1.7–7.7)
Neutrophils Relative %: 69 %
Platelets: 250 10*3/uL (ref 150–400)
RBC: 3.71 MIL/uL — ABNORMAL LOW (ref 3.87–5.11)
RDW: 17.6 % — ABNORMAL HIGH (ref 11.5–15.5)
WBC: 10.4 10*3/uL (ref 4.0–10.5)
nRBC: 0 % (ref 0.0–0.2)

## 2020-10-05 LAB — LIPASE, BLOOD: Lipase: 30 U/L (ref 11–51)

## 2020-10-05 MED ORDER — SODIUM CHLORIDE 0.9 % IV SOLN
1.0000 g | Freq: Once | INTRAVENOUS | Status: AC
  Administered 2020-10-05: 1 g via INTRAVENOUS
  Filled 2020-10-05: qty 10

## 2020-10-05 MED ORDER — METRONIDAZOLE 500 MG PO TABS: 500.0000 mg | ORAL_TABLET | Freq: Three times a day (TID) | ORAL | 0 refills | Status: DC

## 2020-10-05 MED ORDER — CEFDINIR 300 MG PO CAPS: 300.0000 mg | ORAL_CAPSULE | Freq: Two times a day (BID) | ORAL | 0 refills | Status: DC

## 2020-10-05 MED ORDER — SODIUM CHLORIDE 0.9 % IV BOLUS
500.0000 mL | Freq: Once | INTRAVENOUS | Status: AC
  Administered 2020-10-05: 500 mL via INTRAVENOUS

## 2020-10-05 MED ORDER — MORPHINE SULFATE (PF) 4 MG/ML IV SOLN
4.0000 mg | Freq: Once | INTRAVENOUS | Status: AC
  Administered 2020-10-05: 4 mg via INTRAVENOUS
  Filled 2020-10-05: qty 1

## 2020-10-05 MED ORDER — ONDANSETRON HCL 4 MG/2ML IJ SOLN
4.0000 mg | Freq: Once | INTRAMUSCULAR | Status: AC
  Administered 2020-10-05: 4 mg via INTRAVENOUS
  Filled 2020-10-05: qty 2

## 2020-10-05 MED ORDER — METRONIDAZOLE 500 MG PO TABS
500.0000 mg | ORAL_TABLET | Freq: Once | ORAL | Status: AC
  Administered 2020-10-05: 500 mg via ORAL
  Filled 2020-10-05: qty 1

## 2020-10-05 NOTE — ED Provider Notes (Addendum)
[facility] EMERGENCY DEPARTMENT Provider Note   CSN: [id number] Arrival date & time: 10/04/20  2204     History Chief Complaint  Patient presents with  . Flank Pain    Brittany Durham is a 50 y.o. female.  Patient presents to the emergency department for evaluation of right-sided flank pain.  Patient reports symptoms began over a week ago.  She was seen in urgent care and was told that it was a pulled muscle.  She does not remember any direct injury.  She reports that the medicine is not helping.  Pain is worsening.  No associated nausea, vomiting, diarrhea.  No dysuria or urinary frequency, has noted that her urine is darker than usual.        Past Medical History:  Diagnosis Date  . Anemia   . Asthma    childhood  . Carotidynia   . COPD (chronic obstructive pulmonary disease) ([place])   . DDD (degenerative disc disease), cervical   . GERD (gastroesophageal reflux disease)   . Hypertension   . Knee pain    left meniscus tear s/p repair 11/2014  . Seizures ([place])    last seizure was over 10 years ago, unknown etiology  . Stuttering    patient reports this started after her knee surgery in 11/2014.   Marland Kitchen Thyroid nodule     Patient Active Problem List   Diagnosis Date Noted  . Iron deficiency anemia due to chronic blood loss 09/15/2020  . Symptomatic anemia 07/20/2020  . History of peptic ulcer disease 07/20/2020  . Depression, major, single episode, moderate ([place]) 07/15/2020  . Other fatigue 07/15/2020  . Vertigo 01/26/2020  . Preventative health care 12/02/2019  . Diarrhea 09/28/2019  . GAD (generalized anxiety disorder) 07/28/2019  . S/P cervical spinal fusion 03/20/2019  . Chest pain 12/31/2018  . Osteoarthritis 11/18/2017  . Insomnia 11/18/2017  . 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 Tear of lateral meniscus of right knee, current 10/28/2017  . 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 Language difficulty 09/25/2016  . Nausea with vomiting  07/18/2016  . PUD (peptic ulcer disease) 07/18/2016  . Slurred speech   . GI bleed 05/02/2016  . Upper GI bleed 05/02/2016  . Sinus tachycardia 05/02/2016  . Acute blood loss anemia 05/02/2016  . Abdominal pain, epigastric 05/02/2016  . 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 Thyroid nodule 12/14/2014  . Left knee pain 12/14/2014  . Seizures ([place])   . Altered mental status 12/12/2014  . Vitamin D deficiency 06/30/2014  . GERD (gastroesophageal reflux disease) 06/29/2014  . SYNCOPE 03/02/2010  . HYPERTENSION, BENIGN 03/01/2010    Past Surgical History:  Procedure Laterality Date  . ANTERIOR CERVICAL DECOMP/DISCECTOMY FUSION N/A 03/20/2019   Procedure: Anterior Cervical Decompression Fusion - Cervical Four-Cervical Five - Cervical Five-Cervical Six;  Surgeon: Eustace Moore, MD;  Location: [place];  Service: Neurosurgery;  Laterality: N/A;  Anterior Cervical Decompression Fusion - Cervical Four-Cervical Five - Cervical Five-Cervical Six  . BIOPSY  09/20/2020   Procedure: BIOPSY;  Surgeon: Daneil Dolin, MD;  [facility];  Service: Endoscopy;;  . COLONOSCOPY N/A 08/23/2016   Procedure: COLONOSCOPY;  Surgeon: Daneil Dolin, MD;  [facility];  Service: Endoscopy;  Laterality: N/A;  1:00pm  . COLONOSCOPY N/A 08/24/2020   Procedure: COLONOSCOPY;  Surgeon: Daneil Dolin, MD;  [facility];  Service: Endoscopy;  Laterality: N/A;  AM or PM (early as possible as has to have transportation)  .  ESOPHAGOGASTRODUODENOSCOPY N/A 05/03/2016   Procedure: ESOPHAGOGASTRODUODENOSCOPY (EGD);  Surgeon: Daneil Dolin, MD;  [facility];  Service: Endoscopy;  Laterality: N/A;  . ESOPHAGOGASTRODUODENOSCOPY N/A 08/23/2016   Procedure: ESOPHAGOGASTRODUODENOSCOPY (EGD);  Surgeon: Daneil Dolin, MD;  [facility];  Service: Endoscopy;  Laterality: N/A;  . ESOPHAGOGASTRODUODENOSCOPY (EGD) WITH PROPOFOL N/A 09/20/2020   Procedure: ESOPHAGOGASTRODUODENOSCOPY (EGD) WITH  PROPOFOL;  Surgeon: Daneil Dolin, MD;  [facility];  Service: Endoscopy;  Laterality: N/A;  11:00am  . KNEE ARTHROSCOPY Right   . KNEE ARTHROSCOPY WITH MEDIAL MENISECTOMY Left 12/09/2014   Procedure: KNEE ARTHROSCOPY WITH PARTIAL MEDIAL AND LATERAL MENISECTOMY;  Surgeon: Sanjuana Kava, MD;  Location: [facility];  Service: Orthopedics;  Laterality: Left;  . TUBAL LIGATION    . WISDOM TOOTH EXTRACTION       OB History    Gravida  1   Para  1   Term  1   Preterm      AB      Living  1     SAB      IAB      Ectopic      Multiple      Live Births              Family History  Problem Relation Age of Onset  . Hypertension Mother   . Lung cancer Father   . Hypertension Brother   . Colon cancer Neg Hx   . Gastric cancer Neg Hx   . Esophageal cancer Neg Hx     Social History   Tobacco Use  . Smoking status: Never Smoker  . Smokeless tobacco: Never Used  Vaping Use  . Vaping Use: Never used  Substance Use Topics  . Alcohol use: No  . Drug use: No    Home Medications Prior to Admission medications   Medication Sig Start Date End Date Taking? Authorizing Provider  cefdinir (OMNICEF) 300 MG capsule Take 1 capsule (300 mg total) by mouth 2 (two) times daily. 10/05/20  Yes Tru Rana, Gwenyth Allegra, MD  metroNIDAZOLE (FLAGYL) 500 MG tablet Take 1 tablet (500 mg total) by mouth 3 (three) times daily. 10/05/20  Yes Caellum Mancil, Gwenyth Allegra, MD  amLODipine (NORVASC) 10 MG tablet TAKE 1 TABLET ONCE DAILY Patient taking differently: Take 10 mg by mouth daily. 04/01/20   Sharion Balloon, FNP  busPIRone (BUSPAR) 5 MG tablet Take 1 tablet (5 mg total) by mouth 3 (three) times daily. 07/15/20   Ivy Lynn, NP  CLENPIQ 10-3.5-12 MG-GM -GM/160ML SOLN SMARTSIG:320 Milliliter(s) By Mouth As Directed 07/21/20   [provider]  diclofenac (VOLTAREN) 75 MG EC tablet Take 75 mg by mouth daily. 05/21/19   [provider]  diclofenac (VOLTAREN) 75 MG EC  tablet Take by mouth.    [provider]  docusate sodium (COLACE) 250 MG capsule Take 250 mg by mouth daily.    [provider]  Docusate Sodium (DSS) 250 MG CAPS Take by mouth.    [provider]  escitalopram (LEXAPRO) 20 MG tablet Take 1 tablet (20 mg total) by mouth daily. 07/15/20   Ivy Lynn, NP  escitalopram (LEXAPRO) 20 MG tablet Take 1 tablet by mouth daily. 07/15/20   [provider]  ferrous sulfate 325 (65 FE) MG tablet Take 1 tablet (325 mg total) by mouth daily. 07/18/20   Marcello Fennel, PA-C  ferrous sulfate 325 (65 FE) MG tablet Take 1 tablet by mouth  daily. 07/18/20   [provider]  lisinopril (ZESTRIL) 20 MG tablet TAKE 1 TABLET DAILY Patient taking differently: Take 20 mg by mouth daily. 04/01/20   Evelina Dun A, FNP  lisinopril (ZESTRIL) 20 MG tablet Take 1 tablet by mouth daily. 04/01/20   [provider]  meclizine (ANTIVERT) 25 MG tablet Take 1 tablet (25 mg total) by mouth 3 (three) times daily as needed for dizziness. 01/15/20   Milton Ferguson, MD  methocarbamol (ROBAXIN) 500 MG tablet Take by mouth. 04/28/20   [provider]  pantoprazole (PROTONIX) 40 MG tablet TAKE 1 TABLET ONCE DAILY BEFORE BREAKFAST Patient taking differently: Take 40 mg by mouth daily. 03/23/20   Evelina Dun A, FNP  predniSONE (DELTASONE) 5 MG tablet Take by mouth. 09/26/20   [provider]  predniSONE (DELTASONE) 5 MG tablet Take 6-5-4-3-2-1 po qd 09/24/20   [provider]  tiZANidine (ZANAFLEX) 4 MG tablet Take 4 mg by mouth 3 (three) times daily. 09/26/20   [provider]  tiZANidine (ZANAFLEX) 4 MG tablet Take by mouth. 09/24/20 09/24/21  [provider]  Vitamin D, Ergocalciferol, (DRISDOL) 1.25 MG (50000 UNIT) CAPS capsule Take 50,000 Units by mouth once a week. 05/29/19   [provider]    Allergies    Patient has no known allergies.  Review of Systems   Review of  Systems  Genitourinary: Positive for flank pain.  All other systems reviewed and are negative.   Physical Exam Updated Vital Signs BP (!) 149/97   Pulse 100   Temp 99.2 F (37.3 C) (Oral)   Resp 18   Ht 5\' 7"  (1.702 m)   Wt 104.8 kg   LMP 02/18/2019   SpO2 100%   BMI 36.18 kg/m   Physical Exam Vitals and nursing note reviewed.  Constitutional:      General: She is not in acute distress.    Appearance: Normal appearance. She is well-developed.  HENT:     Head: Normocephalic and atraumatic.     Right Ear: Hearing normal.     Left Ear: Hearing normal.     Nose: Nose normal.  Eyes:     Conjunctiva/sclera: Conjunctivae normal.     Pupils: Pupils are equal, round, and reactive to light.  Cardiovascular:     Rate and Rhythm: Regular rhythm.     Heart sounds: S1 normal and S2 normal. No murmur heard. No friction rub. No gallop.   Pulmonary:     Effort: Pulmonary effort is normal. No respiratory distress.     Breath sounds: Normal breath sounds.  Chest:     Chest wall: No tenderness.  Abdominal:     General: Bowel sounds are normal.     Palpations: Abdomen is soft.     Tenderness: There is no abdominal tenderness. There is no guarding or rebound. Negative signs include Murphy's sign and McBurney's sign.     Hernia: No hernia is present.       Comments: Tenderness of lateral abdomen in mid axillary line region, no right upper quadrant, right lower quadrant tenderness.  Musculoskeletal:        General: Normal range of motion.     Cervical back: Normal range of motion and neck supple.  Skin:    General: Skin is warm and dry.     Findings: No rash.  Neurological:     Mental Status: She is alert and oriented to person, place, and time.     GCS: GCS eye subscore  is 4. GCS verbal subscore is 5. GCS motor subscore is 6.     Cranial Nerves: No cranial nerve deficit.     Sensory: No sensory deficit.     Coordination: Coordination normal.  Psychiatric:        Speech: Speech  normal.        Behavior: Behavior normal.        Thought Content: Thought content normal.     ED Results / Procedures / Treatments   Labs (all labs ordered are listed, but only abnormal results are displayed) Labs Reviewed  CBC WITH DIFFERENTIAL/PLATELET - Abnormal; Notable for the following components:      Result Value   RBC 3.71 (*)    Hemoglobin 10.5 (*)    HCT 33.9 (*)    RDW 17.6 (*)    All other components within normal limits  URINALYSIS, ROUTINE W REFLEX MICROSCOPIC - Abnormal; Notable for the following components:   APPearance CLOUDY (*)    Ketones, ur 5 (*)    Protein, ur 30 (*)    Bacteria, UA RARE (*)    All other components within normal limits  URINE CULTURE  COMPREHENSIVE METABOLIC PANEL  LIPASE, BLOOD    EKG None  Radiology CT RENAL STONE STUDY  Result Date: 10/05/2020 CLINICAL DATA:  Right flank pain. EXAM: CT ABDOMEN AND PELVIS WITHOUT CONTRAST TECHNIQUE: Multidetector CT imaging of the abdomen and pelvis was performed following the standard protocol without IV contrast. COMPARISON:  CT abdomen pelvis 04/09/2016 FINDINGS: Lower chest: Bilateral lower lobe subsegmental atelectasis. Moderate volume hiatal hernia. Hepatobiliary: No focal liver abnormality. No gallstones, gallbladder wall thickening, or pericholecystic fluid. No biliary dilatation. Pancreas: No focal lesion. Normal pancreatic contour. No surrounding inflammatory changes. No main pancreatic ductal dilatation. Spleen: Normal in size without focal abnormality. Adrenals/Urinary Tract: No adrenal nodule bilaterally. No nephrolithiasis, no hydronephrosis, and no contour-deforming renal mass. No ureterolithiasis or hydroureter. The urinary bladder is unremarkable. Stomach/Bowel: Stomach is within normal limits. No evidence of bowel wall thickening or dilatation. Scattered colonic diverticulosis with associated pericolonic fat stranding along a ascending colon diverticula (2:45). Associated mild focal bowel  thickening. Appendix appears normal. Vascular/Lymphatic: No abdominal aorta or iliac aneurysm. Mild atherosclerotic plaque of the aorta and its branches. No abdominal, pelvic, or inguinal lymphadenopathy. Reproductive: Large coarsely calcified at least 5 cm lesion within the uterus likely representing a degenerative uterine fibroid. Other: No intraperitoneal free fluid. No intraperitoneal free gas. No organized fluid collection. Musculoskeletal: No abdominal wall hernia or abnormality. No suspicious lytic or blastic osseous lesions. No acute displaced fracture. Multilevel degenerative changes of the spine. IMPRESSION: 1. Scattered colonic diverticulosis with uncomplicated acute diverticulitis of the ascending colon. Recommend colonoscopy status post treatment and status post complete resolution of inflammatory changes to evaluate for an underlying lesion. 2. Moderate volume hiatal hernia. 3. Degenerative uterine fibroid measuring up to at least 5 cm. Electronically Signed   By: Iven Finn M.D.   On: 10/05/2020 01:33    Procedures Procedures   Medications Ordered in ED Medications  sodium chloride 0.9 % bolus 500 mL (has no administration in time range)  cefTRIAXone (ROCEPHIN) 1 g in sodium chloride 0.9 % 100 mL IVPB (has no administration in time range)  metroNIDAZOLE (FLAGYL) tablet 500 mg (has no administration in time range)  morphine 4 MG/ML injection 4 mg (4 mg Intravenous Given 10/05/20 0041)  ondansetron (ZOFRAN) injection 4 mg (4 mg Intravenous Given 10/05/20 0041)    ED Course  I have reviewed  the triage vital signs and the nursing notes.  Pertinent labs & imaging results that were available during my care of the patient were reviewed by me and considered in my medical decision making (see chart for details).    MDM Rules/Calculators/A&P                          Patient presents to the emergency department for evaluation of right-sided flank pain that has been ongoing for a week.   She has had dark urine but no other urinary symptoms.  Patient afebrile at arrival.  Blood work is unremarkable.  Urinalysis equivocal.  CT renal stone study does not show any kidney stones which she does have a sending: Diverticulitis.  This explains her current symptoms.  Treat with antibiotics, follow-up with GI (patient has an appointment scheduled already for the 16th of next month).  Final Clinical Impression(s) / ED Diagnoses Final diagnoses:  Diverticulitis    Rx / DC Orders ED Discharge Orders         Ordered    cefdinir (OMNICEF) 300 MG capsule  2 times daily        10/05/20 0147    metroNIDAZOLE (FLAGYL) 500 MG tablet  3 times daily        10/05/20 0147           Orpah Greek, MD 10/05/20 0147    Orpah Greek, MD 10/05/20 [phone]

## 2020-10-05 NOTE — ED Notes (Signed)
Patient transported to CT 

## 2020-10-06 LAB — URINE CULTURE: Culture: NO GROWTH

## 2020-10-07 ENCOUNTER — Encounter: Payer: Self-pay | Admitting: Internal Medicine

## 2020-10-24 ENCOUNTER — Encounter (HOSPITAL_COMMUNITY): Payer: Self-pay | Admitting: Hematology

## 2020-10-24 ENCOUNTER — Other Ambulatory Visit: Payer: Self-pay

## 2020-10-24 ENCOUNTER — Ambulatory Visit: Payer: Medicare Other | Admitting: Nurse Practitioner

## 2020-10-24 ENCOUNTER — Ambulatory Visit (INDEPENDENT_AMBULATORY_CARE_PROVIDER_SITE_OTHER): Payer: Medicare Other | Admitting: Family

## 2020-10-24 ENCOUNTER — Encounter: Payer: Self-pay | Admitting: Family

## 2020-10-24 VITALS — BP 132/86 | HR 111 | Temp 97.8°F | Ht 67.0 in | Wt 231.0 lb

## 2020-10-24 DIAGNOSIS — R109 Unspecified abdominal pain: Secondary | ICD-10-CM | POA: Diagnosis not present

## 2020-10-24 DIAGNOSIS — K5792 Diverticulitis of intestine, part unspecified, without perforation or abscess without bleeding: Secondary | ICD-10-CM | POA: Diagnosis not present

## 2020-10-24 LAB — MICROSCOPIC EXAMINATION

## 2020-10-24 LAB — URINALYSIS, COMPLETE
Bilirubin, UA: NEGATIVE
Glucose, UA: NEGATIVE
Nitrite, UA: NEGATIVE
RBC, UA: NEGATIVE
Specific Gravity, UA: 1.01 (ref 1.005–1.030)
Urobilinogen, Ur: 0.2 mg/dL (ref 0.2–1.0)
pH, UA: 7 (ref 5.0–7.5)

## 2020-10-24 MED ORDER — FLUCONAZOLE 150 MG PO TABS: 150.0000 mg | ORAL_TABLET | ORAL | 0 refills | Status: DC | PRN

## 2020-10-24 MED ORDER — METRONIDAZOLE 500 MG PO TABS: 500.0000 mg | ORAL_TABLET | Freq: Two times a day (BID) | ORAL | 0 refills | Status: DC

## 2020-10-24 MED ORDER — CIPROFLOXACIN HCL 500 MG PO TABS: 500.0000 mg | ORAL_TABLET | Freq: Two times a day (BID) | ORAL | 0 refills | Status: DC

## 2020-10-24 MED ORDER — CEFTRIAXONE SODIUM 1 G IJ SOLR
1.0000 g | Freq: Once | INTRAMUSCULAR | Status: AC
  Administered 2020-10-24: 1 g via INTRAMUSCULAR

## 2020-10-24 NOTE — Patient Instructions (Signed)
Flank Pain, Adult Flank pain is pain that is located on the side of the body between the upper abdomen and the back. This area is called the flank. The pain may occur over a short period of time (acute), or it may be long-term or recurring (chronic). It may be mild or severe. Flank pain can be caused by many things, including: Muscle soreness or injury. Kidney stones or kidney disease. Stress. A disease of the spine (vertebral disk disease). A lung infection (pneumonia). Fluid around the lungs (pulmonary edema). A skin rash caused by the chickenpox virus (shingles). Tumors that affect the back of the abdomen. Gallbladder disease. Follow these instructions at home:  Drink enough fluid to keep your urine clear or pale yellow. Rest as told by your health care provider. Take over-the-counter and prescription medicines only as told by your health care provider. Keep a journal to track what has caused your flank pain and what has made it feel better. Keep all follow-up visits as told by your health care provider. This is important. Contact a health care provider if: Your pain is not controlled with medicine. You have new symptoms. Your pain gets worse. You have a fever. Your symptoms last longer than 2-3 days. You have trouble urinating or you are urinating very frequently. Get help right away if: You have trouble breathing or you are short of breath. Your abdomen hurts or it is swollen or red. You have nausea or vomiting. You feel faint or you pass out. You have blood in your urine. Summary Flank pain is pain that is located on the side of the body between the upper abdomen and the back. The pain may occur over a short period of time (acute), or it may be long-term or recurring (chronic). It may be mild or severe. Flank pain can be caused by many things. Contact your health care provider if your symptoms get worse or they last longer than 2-3 days. This information is not intended to  replace advice given to you by your health care provider. Make sure you discuss any questions you have with your healthcare provider. Document Revised: 01/19/2020 Document Reviewed: 01/22/2020 Elsevier Patient Education  2022 [employer].

## 2020-10-24 NOTE — Progress Notes (Signed)
Subjective:    Patient ID: Brittany Durham, female    DOB: Aug 14, 1970, 50 y.o.   MRN: [id number]  Chief Complaint  Patient presents with   Flank Pain    Right    PT presents to the office today with right flank pain that has been on going for two months. She was seen in the ED on 10/05/20 and diagnosed with diverticulitis given cefdinir and flagyl. She reports this did not help.  She had a CT scan that showed, "1. Scattered colonic diverticulosis with uncomplicated acute diverticulitis of the ascending colon. Recommend colonoscopy status post treatment and status post complete resolution of inflammatory changes to evaluate for an underlying lesion. 2. Moderate volume hiatal hernia. 3. Degenerative uterine fibroid measuring up to at least 5 cm."  She has a follow up with GI on 10/27/20.  Flank Pain This is a recurrent problem. The current episode started more than 1 month ago. The problem has been waxing and waning since onset. The pain is at a severity of 10/10. The pain is moderate.     Review of Systems  Genitourinary:  Positive for flank pain.  All other systems reviewed and are negative.     Objective:   Physical Exam Vitals reviewed.  Constitutional:      General: She is not in acute distress.    Appearance: She is well-developed.  HENT:     Head: Normocephalic and atraumatic.  Eyes:     Pupils: Pupils are equal, round, and reactive to light.  Neck:     Thyroid: No thyromegaly.  Cardiovascular:     Rate and Rhythm: Normal rate and regular rhythm.     Heart sounds: Normal heart sounds. No murmur heard. Pulmonary:     Effort: Pulmonary effort is normal. No respiratory distress.     Breath sounds: Normal breath sounds. No wheezing.  Abdominal:     General: Bowel sounds are normal. There is no distension.     Palpations: Abdomen is soft.     Tenderness: There is no abdominal tenderness. There is right CVA tenderness.  Musculoskeletal:        General: No tenderness.  Normal range of motion.     Cervical back: Normal range of motion and neck supple.  Skin:    General: Skin is warm and dry.  Neurological:     Mental Status: She is alert and oriented to person, place, and time.     Cranial Nerves: No cranial nerve deficit.     Deep Tendon Reflexes: Reflexes are normal and symmetric.  Psychiatric:        Behavior: Behavior normal.        Thought Content: Thought content normal.        Judgment: Judgment normal.      BP 132/86   Pulse (!) 111   Temp 97.8 F (36.6 C) (Temporal)   Ht _0  (1.702 m)   Wt 231 lb (104.8 kg)   LMP 02/18/2019   BMI 36.18 kg/m      Assessment & Plan:  Nyeisha Goodall comes in today with chief complaint of Flank Pain (Right )   Diagnosis and orders addressed:  1. Flank pain - Urinalysis, Complete - CBC with Differential/Platelet - CMP14+EGFR  2. Diverticulitis - cefTRIAXone (ROCEPHIN) injection 1 g - ciprofloxacin (CIPRO) 500 MG tablet; Take 1 tablet (500 mg total) by mouth 2 (two) times daily.  Dispense: 14 tablet; Refill: 0 - metroNIDAZOLE (FLAGYL) 500 MG tablet; Take 1 tablet (500  mg total) by mouth 2 (two) times daily.  Dispense: 14 tablet; Refill: 0 - CBC with Differential/Platelet - CMP14+EGFR  3. Abdominal pain, unspecified abdominal location - CT ABDOMEN PELVIS W WO CONTRAST; Future - CBC with Differential/Platelet - CMP14+EGFR   Labs pending Does not present as classic diverticulitis? No abdomen pain on exam. Extreme Flank pain with palpation.  Start antibiotics today CT abdomen pending Keep GI appt     Evelina Dun, FNP

## 2020-10-25 ENCOUNTER — Ambulatory Visit (HOSPITAL_COMMUNITY)
Admission: RE | Admit: 2020-10-25 | Discharge: 2020-10-25 | Disposition: A | Discharge status: Home / Self Care | Payer: Medicare Other | Source: Ambulatory Visit | Attending: Family | Admitting: Family

## 2020-10-25 ENCOUNTER — Ambulatory Visit: Payer: Medicare Other | Admitting: Nurse Practitioner

## 2020-10-25 ENCOUNTER — Encounter (HOSPITAL_COMMUNITY): Payer: Self-pay | Admitting: Radiology

## 2020-10-25 DIAGNOSIS — R197 Diarrhea, unspecified: Secondary | ICD-10-CM | POA: Diagnosis not present

## 2020-10-25 DIAGNOSIS — K449 Diaphragmatic hernia without obstruction or gangrene: Secondary | ICD-10-CM | POA: Diagnosis not present

## 2020-10-25 DIAGNOSIS — R109 Unspecified abdominal pain: Secondary | ICD-10-CM | POA: Insufficient documentation

## 2020-10-25 DIAGNOSIS — K5730 Diverticulosis of large intestine without perforation or abscess without bleeding: Secondary | ICD-10-CM | POA: Diagnosis not present

## 2020-10-25 DIAGNOSIS — D259 Leiomyoma of uterus, unspecified: Secondary | ICD-10-CM | POA: Diagnosis not present

## 2020-10-25 LAB — CMP14+EGFR
ALT: 17 IU/L (ref 0–32)
AST: 13 IU/L (ref 0–40)
Albumin/Globulin Ratio: 1.4 (ref 1.2–2.2)
Albumin: 4.9 g/dL (ref 3.8–4.9)
Alkaline Phosphatase: 102 IU/L (ref 44–121)
BUN/Creatinine Ratio: 14 (ref 9–23)
BUN: 11 mg/dL (ref 6–24)
Bilirubin Total: 0.3 mg/dL (ref 0.0–1.2)
CO2: 28 mmol/L (ref 20–29)
Calcium: 10.2 mg/dL (ref 8.7–10.2)
Chloride: 98 mmol/L (ref 96–106)
Creatinine, Ser: 0.76 mg/dL (ref 0.57–1.00)
Globulin, Total: 3.4 g/dL (ref 1.5–4.5)
Glucose: 80 mg/dL (ref 65–99)
Potassium: 4.2 mmol/L (ref 3.5–5.2)
Sodium: 141 mmol/L (ref 134–144)
Total Protein: 8.3 g/dL (ref 6.0–8.5)
eGFR: 95 mL/min/{1.73_m2} (ref >59)

## 2020-10-25 LAB — CBC WITH DIFFERENTIAL/PLATELET
Basophils Absolute: 0 10*3/uL (ref 0.0–0.2)
Basos: 0 %
EOS (ABSOLUTE): 0.2 10*3/uL (ref 0.0–0.4)
Eos: 3 %
Hematocrit: 39 % (ref 34.0–46.6)
Hemoglobin: 12.6 g/dL (ref 11.1–15.9)
Immature Grans (Abs): 0 10*3/uL (ref 0.0–0.1)
Immature Granulocytes: 0 %
Lymphocytes Absolute: 2.3 10*3/uL (ref 0.7–3.1)
Lymphs: 31 %
MCH: 28.4 pg (ref 26.6–33.0)
MCHC: 32.3 g/dL (ref 31.5–35.7)
MCV: 88 fL (ref 79–97)
Monocytes Absolute: 0.6 10*3/uL (ref 0.1–0.9)
Monocytes: 9 %
Neutrophils Absolute: 4.1 10*3/uL (ref 1.4–7.0)
Neutrophils: 57 %
Platelets: 277 10*3/uL (ref 150–450)
RBC: 4.43 x10E6/uL (ref 3.77–5.28)
RDW: 14.8 % (ref 11.7–15.4)
WBC: 7.2 10*3/uL (ref 3.4–10.8)

## 2020-10-25 MED ORDER — IOHEXOL 9 MG/ML PO SOLN
ORAL | Status: AC
  Filled 2020-10-25: qty 1000

## 2020-10-25 MED ORDER — IOHEXOL 300 MG/ML  SOLN
25.0000 mL | Freq: Once | INTRAMUSCULAR | Status: AC | PRN
  Administered 2020-10-25: 25 mL via INTRAVENOUS

## 2020-10-25 NOTE — Progress Notes (Signed)
Pt called and aware

## 2020-10-27 ENCOUNTER — Other Ambulatory Visit (HOSPITAL_COMMUNITY)
Admission: RE | Admit: 2020-10-27 | Discharge: 2020-10-27 | Disposition: A | Discharge status: Home / Self Care | Payer: Medicare Other | Source: Ambulatory Visit | Attending: Gastroenterology | Admitting: Gastroenterology

## 2020-10-27 ENCOUNTER — Other Ambulatory Visit: Payer: Self-pay

## 2020-10-27 ENCOUNTER — Ambulatory Visit (INDEPENDENT_AMBULATORY_CARE_PROVIDER_SITE_OTHER): Payer: Medicare Other | Admitting: Gastroenterology

## 2020-10-27 ENCOUNTER — Ambulatory Visit (HOSPITAL_COMMUNITY)
Admission: RE | Admit: 2020-10-27 | Discharge: 2020-10-27 | Disposition: A | Discharge status: Home / Self Care | Payer: Medicare Other | Source: Ambulatory Visit | Attending: Gastroenterology | Admitting: Gastroenterology

## 2020-10-27 ENCOUNTER — Encounter: Payer: Self-pay | Admitting: Gastroenterology

## 2020-10-27 VITALS — BP 124/80 | HR 113 | Temp 97.5°F | Ht 67.0 in | Wt 232.0 lb

## 2020-10-27 DIAGNOSIS — K219 Gastro-esophageal reflux disease without esophagitis: Secondary | ICD-10-CM | POA: Diagnosis not present

## 2020-10-27 DIAGNOSIS — R109 Unspecified abdominal pain: Secondary | ICD-10-CM | POA: Diagnosis not present

## 2020-10-27 DIAGNOSIS — R197 Diarrhea, unspecified: Secondary | ICD-10-CM | POA: Diagnosis not present

## 2020-10-27 DIAGNOSIS — R10A1 Flank pain, right side: Secondary | ICD-10-CM

## 2020-10-27 DIAGNOSIS — M545 Low back pain, unspecified: Secondary | ICD-10-CM | POA: Diagnosis not present

## 2020-10-27 NOTE — Progress Notes (Signed)
Referring Provider: Sharion Balloon, FNP Primary Care Physician:  Sharion Balloon, FNP Primary GI Physician: Dr. Gala Romney  Chief Complaint  Patient presents with   Anemia    Stools are dark but takes iron   Diarrhea    Stool is loose and has incontinent episodes. Rectum burns   Diverticulitis    Currently taking Cipro and Flagyl per pt    HPI:   Brittany Durham is a 50 y.o. female presenting today with a history of Iron deficiency anemia, last seen in our office 07/20/20 after being referred to emergency room by PCP on 07/18/20 for hgb 5.8. She denied any overt bleeding at the time but did endorse fatigue. Hemocult negative and initial Hgb 5.7 in ED, patient received 2 Units PRBCs. Repeat hgb was 7.5. discharged on supplemental PO Iron and referred to Hematology.  Previous history of anemia at the end of 2017 related to gastric ulcer present on EGD 05/03/16, suspected r/t NSAID use at that time. Placed on PPI with repeat EGD 08/23/16 showed complete healing.   Recent Diverticulitis noted on CT performed in ED on 5/25, treated with flagyl. Currently on cipro and flagyl by PCP for suspicion of recurrent diverticulitis.    GERD: doing well on protonix daily, denies reflux symptoms, dysphagia or odynophagia.   Iron deficient Anemia: Saw hematology for initial visit on 07/29/20 with Hgb 8.5 (microcytic with MCV 78.2).  Ferritin 7,  Iron sat 32%.  She received IV Venofer x 800mg  in divided doses from 08/02/2020 through 08/11/2020.  Repeat labs from 09/13/2020 show Hgb improved to 11.2 with normal MCV 86.8. Iron saturation remained low at 9%, with ferritin 23. Most recent hgb 12.6 as of 10/24/20. Continues to be followed by hematology, receiving IV iron as well as continued on PO Iron.She does endorse dark stools but states these began after starting PO Iron. No BRBPR noted. Colonoscopy in April unremarkable. EGD in may showed cameron lesions which were suspected source of her anemia r/t voltaren use. No  NSAID use at this time. Denies dizziness or fatigue.   Diarrhea:  reports diarrhea with fecal incontinence since being placed on flagyl in the ED in May, now on second course of flagyl since then as well as cipro by PCP for suspected recurrent diverticulitis. She reports fecal incontinence, as well as nocturnal fecal incontinence. Reports BMs are watery. Denies abdominal pain.   R Flank/Hip pain: ongoing R flank pain x2 months, notes pain is constant.  Currently on cipro and flagyl as of 6/13 for suspected diverticulitis by PCP, however, CT imaging negative for acute diverticulitis. She was seen at Urgent care in May for the same, told she had a muscle strain and was treated with prednisone but denies any relief of symptoms at that time. Denies urinary symptoms. No radiation of pain or difficulty ambulating.    Last Colonoscopy: 08/24/20- Diverticulosis in the entire examined colon. Redundant colon. Exam otherwise normal  Last Endoscopy: 5/10/22Normal esophagus. Large hiatal hernia. Multiple gastric erosions (Cameron lesions). Normal duodenal bulb and second portion of the duodenum. Suspicion Lysbeth Galas lesions causing intermittent bleeding in the setting of ongoing NSAID use (Voltaren). Negative H. Pylori. No capsule deployment.   Recommendations:  Repeat screening colonoscopy in 2032 Repeat EGD not recommended unless clinically indicated.    Past Medical History:  Diagnosis Date   Anemia    Asthma    childhood   Carotidynia    COPD (chronic obstructive pulmonary disease) ([place])    DDD (degenerative disc  disease), cervical    GERD (gastroesophageal reflux disease)    Hypertension    Knee pain    left meniscus tear s/p repair 11/2014   Seizures ([place])    last seizure was over 10 years ago, unknown etiology   Stuttering    patient reports this started after her knee surgery in 11/2014.    Thyroid nodule     Past Surgical History:  Procedure Laterality Date   ANTERIOR CERVICAL  DECOMP/DISCECTOMY FUSION N/A 03/20/2019   Procedure: Anterior Cervical Decompression Fusion - Cervical Four-Cervical Five - Cervical Five-Cervical Six;  Surgeon: Eustace Moore, MD;  Location: [facility];  Service: Neurosurgery;  Laterality: N/A;  Anterior Cervical Decompression Fusion - Cervical Four-Cervical Five - Cervical Five-Cervical Six   BIOPSY  09/20/2020   Procedure: BIOPSY;  Surgeon: Daneil Dolin, MD;  [facility];  Service: Endoscopy;;   COLONOSCOPY N/A 08/23/2016   Procedure: COLONOSCOPY;  Surgeon: Daneil Dolin, MD;  [facility];  Service: Endoscopy;  Laterality: N/A;  1:00pm   COLONOSCOPY N/A 08/24/2020   Diverticulosis in the entire examined colon. Redundant colon. Exam otherwise normal   ESOPHAGOGASTRODUODENOSCOPY N/A 05/03/2016   Procedure: ESOPHAGOGASTRODUODENOSCOPY (EGD);  Surgeon: Daneil Dolin, MD;  [facility];  Service: Endoscopy;  Laterality: N/A;   ESOPHAGOGASTRODUODENOSCOPY N/A 08/23/2016   Procedure: ESOPHAGOGASTRODUODENOSCOPY (EGD);  Surgeon: Daneil Dolin, MD;  [facility];  Service: Endoscopy;  Laterality: N/A;   ESOPHAGOGASTRODUODENOSCOPY (EGD) WITH PROPOFOL N/A 09/20/2020   normal esophagus. Large hiatal hernia. Multiple gastric erosions (Cameron lesions). Normal duodenal bulb and second portion of the duodenum. Suspicion Lysbeth Galas lesions causing intermittent bleeding   KNEE ARTHROSCOPY Right    KNEE ARTHROSCOPY WITH MEDIAL MENISECTOMY Left 12/09/2014   Procedure: KNEE ARTHROSCOPY WITH PARTIAL MEDIAL AND LATERAL MENISECTOMY;  Surgeon: Sanjuana Kava, MD;  Location: [facility];  Service: Orthopedics;  Laterality: Left;   TUBAL LIGATION     WISDOM TOOTH EXTRACTION      Current Outpatient Medications  Medication Sig Dispense Refill   amLODipine (NORVASC) 10 MG tablet TAKE 1 TABLET ONCE DAILY (Patient taking differently: Take 10 mg by mouth daily.) 90 tablet 1   busPIRone (BUSPAR) 5 MG tablet Take 1 tablet (5 mg total) by  mouth 3 (three) times daily. 60 tablet 0   ciprofloxacin (CIPRO) 500 MG tablet Take 1 tablet (500 mg total) by mouth 2 (two) times daily. 14 tablet 0   diclofenac (VOLTAREN) 75 MG EC tablet Take 75 mg by mouth daily.     diclofenac (VOLTAREN) 75 MG EC tablet Take by mouth.     escitalopram (LEXAPRO) 20 MG tablet Take 1 tablet (20 mg total) by mouth daily. 60 tablet 0   escitalopram (LEXAPRO) 20 MG tablet Take 1 tablet by mouth daily.     ferrous sulfate 325 (65 FE) MG tablet Take 1 tablet (325 mg total) by mouth daily. 30 tablet 0   ferrous sulfate 325 (65 FE) MG tablet Take 1 tablet by mouth daily.     fluconazole (DIFLUCAN) 150 MG tablet Take 1 tablet (150 mg total) by mouth every three (3) days as needed. 3 tablet 0   lisinopril (ZESTRIL) 20 MG tablet TAKE 1 TABLET DAILY (Patient taking differently: Take 20 mg by mouth daily.) 90 tablet 1   lisinopril (ZESTRIL) 20 MG tablet Take 1 tablet by mouth daily.     meclizine (ANTIVERT) 25 MG tablet Take 1 tablet (25 mg total) by mouth 3 (three)  times daily as needed for dizziness. 15 tablet 0   methocarbamol (ROBAXIN) 500 MG tablet Take by mouth.     metroNIDAZOLE (FLAGYL) 500 MG tablet Take 1 tablet (500 mg total) by mouth 2 (two) times daily. 14 tablet 0   pantoprazole (PROTONIX) 40 MG tablet TAKE 1 TABLET ONCE DAILY BEFORE BREAKFAST (Patient taking differently: Take 40 mg by mouth daily.) 30 tablet 5   tiZANidine (ZANAFLEX) 4 MG tablet Take 4 mg by mouth 3 (three) times daily.     tiZANidine (ZANAFLEX) 4 MG tablet Take by mouth.     Vitamin D, Ergocalciferol, (DRISDOL) 1.25 MG (50000 UNIT) CAPS capsule Take 50,000 Units by mouth once a week.     No current facility-administered medications for this visit.    Allergies as of 10/27/2020   (No Known Allergies)    Family History  Problem Relation Age of Onset   Hypertension Mother    Lung cancer Father    Hypertension Brother    Colon cancer Neg Hx    Gastric cancer Neg Hx    Esophageal  cancer Neg Hx     Social History   Socioeconomic History   Marital status: Single    Spouse name: Not on file   Number of children: 1   Years of education: 12   Highest education level: Not on file  Occupational History   Occupation: Umemployed    Comment: Disability  Tobacco Use   Smoking status: Never   Smokeless tobacco: Never  Vaping Use   Vaping Use: Never used  Substance and Sexual Activity   Alcohol use: No   Drug use: No   Sexual activity: Not Currently    Birth control/protection: Surgical  Other Topics Concern   Not on file  Social History Narrative   Lives at home alone.   Right-handed.   Occasional caffeine use.   Social Determinants of Health   Financial Resource Strain: Not on file  Food Insecurity: Not on file  Transportation Needs: Not on file  Physical Activity: Not on file  Stress: Not on file  Social Connections: Not on file    Review of Systems: Gen: Denies fever, chills, anorexia. Denies fatigue, weakness, weight loss.  CV: Denies chest pain, palpitations, syncope, peripheral edema, and claudication. Resp: Denies dyspnea at rest, cough, wheezing, coughing up blood, and pleurisy. GI: Denies vomiting blood, jaundice, Denies dysphagia or odynophagia. Endorses fecal incontinence and diarrhea.  Derm: Denies rash, itching, dry skin Psych: Denies depression, anxiety, memory loss, confusion. No homicidal or suicidal ideation.  Heme: Denies bruising, bleeding, and enlarged lymph nodes.  Physical Exam: BP 124/80   Pulse (!) 113   Temp (!) 97.5 F (36.4 C) (Temporal)   Ht 5\' 7"  (1.702 m)   Wt 232 lb (105.2 kg)   LMP 02/18/2019   BMI 36.34 kg/m  General:   Alert and oriented. No distress noted. Pleasant and cooperative.  Head:  Normocephalic and atraumatic. Eyes:  Conjuctiva clear without scleral icterus. Mouth:  Oral mucosa pink and moist. Good dentition. No lesions. Heart: Normal rate and rhythm, s1 and s2 heart sounds present.  Lungs: Clear  lung sounds in all lobes. Respirations equal and unlabored. Abdomen:  +BS, soft, non-tender and non-distended. No rebound or guarding. No HSM or masses noted. Msk:  Symmetrical without gross deformities. Normal posture. TTP of R flank/hip area.  Extremities:  Without edema. Neurologic:  Alert and  oriented x4 Psych:  Alert and cooperative. Normal mood and affect.  ASSESSMENT:  Brittany Durham is a 50 y.o. female presenting today for follow up of anemia and GERD as well as acute c/o R flank/hip pain and diarrhea with fecal incontinence that has been ongoing since May.   GERD doing well on daily protonix, no breakthrough reflux symptoms, no dysphagia or odynophagia.    Hemoglobin stable at 12.6 as of 10/24/20, however ferritin remains low at 23, pt followed by hematology and currently on PO Iron and IV iron with venofer. No dizziness or weakness, no BRBPR or melena. . Colonoscopy 08/24/20 unremarkable. Anemia was suspected r/t  cameron lesions secondary to NSAID use found on EGD 09/20/20. Avoiding NSAID use at this time. Patient has f/u with hematology in august.   She reports watery diarrhea that began after initial course of flagyl 10/05/20 for diverticulitis. Is having reported fecal incontinence with nocturnal fecal incontinence as well. No other associated symptoms. Suspicion C diff r/t ongoing antibiotic use over the past month.   R flank/hip pain has been ongoing since May despite previous course of prednisone after initial diagnosis of muscle strain at urgent care. Saw PCP 6/13 and was treated empirically with cipro and flagyl for suspicion of recurrent diverticulitis, however, CT abd/pelvis on 10/25/20 negative for diverticulitis and urinalysis and urine culture were also unremarkable. she denies any associated abdominal pain or urinary symptoms.  She is very tender upon palpation of R flank/hip area, suspicion musculoskeletal etiology.    PLAN:  C diff stool testing 2. Lumbar xray to further  evaluate R flank/hip pain 3. Stop flagyl and cipro as CT negative for diverticulitis, no indication for continuing these medications 4. Follow up with PCP regarding ongoing flank pain  5. Keep hematology appt to follow anemia  6. Will plan for capsule study if hemoglobin declines again. 7. Continue Protonix daily with good result 8. Continue to avoid all NSAID use   Follow Up:3 Months  Chelsea L. Alver Sorrow, MSN, APRN, AGNP-C [facility]  Visit was completed in conjunction with Scherrie Gerlach, MSN, APRN, AGNP-C. I agree with above.  Annitta Needs, PhD, ANP-BC [facility]

## 2020-10-27 NOTE — Patient Instructions (Addendum)
  Please stop cipro and flagyl.  We will order xray of lumbar area to evaluate your flank pain further and complete C diff stool testing to evaluate cause of your diarrhea.  Please follow up with your PCP regarding your ongoing flank pain  Keep hematology appt to follow up on anemia.   Will pursue capsule study if hemoglobin declines again.   Follow up 3 months.

## 2020-10-31 ENCOUNTER — Other Ambulatory Visit (HOSPITAL_COMMUNITY)
Admission: RE | Admit: 2020-10-31 | Discharge: 2020-10-31 | Disposition: A | Discharge status: Home / Self Care | Payer: Medicare Other | Source: Ambulatory Visit | Attending: Gastroenterology | Admitting: Gastroenterology

## 2020-10-31 ENCOUNTER — Other Ambulatory Visit: Payer: Self-pay

## 2020-10-31 DIAGNOSIS — R197 Diarrhea, unspecified: Secondary | ICD-10-CM | POA: Diagnosis not present

## 2020-10-31 LAB — C DIFFICILE QUICK SCREEN W PCR REFLEX
C Diff antigen: NEGATIVE
C Diff interpretation: NOT DETECTED
C Diff toxin: NEGATIVE

## 2020-11-01 ENCOUNTER — Telehealth: Payer: Self-pay | Admitting: Internal Medicine

## 2020-11-01 NOTE — Telephone Encounter (Signed)
Pt's caregiver called for patient saying she hada CT done and hasn't heard from [place] about her results. I told her that Roseanne Kaufman, NP sent the patient a message via MyChart letting her know that her results were negative and recommended she follow up with her PCP. Then she said the medications were not helping her and patient was crying with pain. Please advise. [phone] she asked to call between 1230 pm and 1 pm.

## 2020-11-01 NOTE — Telephone Encounter (Signed)
Spoke to daughter.  She was made aware of results from CT and informed that negative for Cdiff.  She voiced understanding.    Roseanne Kaufman, NP:  Pt still with diarrhea.  She thinks it is related to medications that she is on.  On antibiotics as well due to recent urine results from 10/25/2020.  Having stomach pain more towards her side and hurts in her back as well.  Has ov with PCP on 11/09/2020 but wants to know if we can give any recommendations before her ov with PCP for flank pain.

## 2020-11-02 MED ORDER — DICYCLOMINE HCL 10 MG PO CAPS: 10.0000 mg | ORAL_CAPSULE | Freq: Three times a day (TID) | ORAL | 3 refills | Status: DC

## 2020-11-02 NOTE — Telephone Encounter (Signed)
I sent in dicyclomine capsules. Take one before meals and at bedtime, no more than 4 a day, for abdominal cramping/discomfort and looser stool. Side effects can include dry mouth, dizziness, and constipation. Stop if constipated or other side effects. Let me know if this is helpful!

## 2020-11-02 NOTE — Telephone Encounter (Addendum)
Spoke with pt.  She was informed that Roseanne Kaufman, NP sent in RX for dicyclomine capsules.  She was advised on how to take it and was informed to stop it if any side effects.  Discussed side effects with pt.  She was informed to let us know if this helps. Pt voiced understanding.

## 2020-11-04 ENCOUNTER — Other Ambulatory Visit: Payer: Self-pay | Admitting: Family

## 2020-11-04 ENCOUNTER — Other Ambulatory Visit: Payer: Self-pay | Admitting: Family Medicine

## 2020-11-04 DIAGNOSIS — F411 Generalized anxiety disorder: Secondary | ICD-10-CM

## 2020-11-09 ENCOUNTER — Encounter: Payer: Self-pay | Admitting: Family

## 2020-11-09 ENCOUNTER — Other Ambulatory Visit: Payer: Self-pay

## 2020-11-09 ENCOUNTER — Ambulatory Visit (INDEPENDENT_AMBULATORY_CARE_PROVIDER_SITE_OTHER): Payer: Medicare Other | Admitting: Family

## 2020-11-09 VITALS — BP 111/72 | HR 102 | Temp 98.2°F | Ht 67.0 in | Wt 232.8 lb

## 2020-11-09 DIAGNOSIS — M8949 Other hypertrophic osteoarthropathy, multiple sites: Secondary | ICD-10-CM

## 2020-11-09 DIAGNOSIS — G4700 Insomnia, unspecified: Secondary | ICD-10-CM

## 2020-11-09 DIAGNOSIS — I10 Essential (primary) hypertension: Secondary | ICD-10-CM | POA: Diagnosis not present

## 2020-11-09 DIAGNOSIS — M159 Polyosteoarthritis, unspecified: Secondary | ICD-10-CM

## 2020-11-09 DIAGNOSIS — F321 Major depressive disorder, single episode, moderate: Secondary | ICD-10-CM | POA: Diagnosis not present

## 2020-11-09 DIAGNOSIS — F809 Developmental disorder of speech and language, unspecified: Secondary | ICD-10-CM | POA: Diagnosis not present

## 2020-11-09 DIAGNOSIS — D649 Anemia, unspecified: Secondary | ICD-10-CM

## 2020-11-09 DIAGNOSIS — F411 Generalized anxiety disorder: Secondary | ICD-10-CM | POA: Diagnosis not present

## 2020-11-09 DIAGNOSIS — K219 Gastro-esophageal reflux disease without esophagitis: Secondary | ICD-10-CM

## 2020-11-09 NOTE — Patient Instructions (Signed)
Health Maintenance, Female Adopting a healthy lifestyle and getting preventive care are important in promoting health and wellness. Ask your health care provider about: The right schedule for you to have regular tests and exams. Things you can do on your own to prevent diseases and keep yourself healthy. What should I know about diet, weight, and exercise? Eat a healthy diet  Eat a diet that includes plenty of vegetables, fruits, low-fat dairy products, and lean protein. Do not eat a lot of foods that are high in solid fats, added sugars, or sodium.  Maintain a healthy weight Body mass index (BMI) is used to identify weight problems. It estimates body fat based on height and weight. Your health care provider can help determineyour BMI and help you achieve or maintain a healthy weight. Get regular exercise Get regular exercise. This is one of the most important things you can do for your health. Most adults should: Exercise for at least 150 minutes each week. The exercise should increase your heart rate and make you sweat (moderate-intensity exercise). Do strengthening exercises at least twice a week. This is in addition to the moderate-intensity exercise. Spend less time sitting. Even light physical activity can be beneficial. Watch cholesterol and blood lipids Have your blood tested for lipids and cholesterol at 52 years of age, then havethis test every 5 years. Have your cholesterol levels checked more often if: Your lipid or cholesterol levels are high. You are older than 52 years of age. You are at high risk for heart disease. What should I know about cancer screening? Depending on your health history and family history, you may need to have cancer screening at various ages. This may include screening for: Breast cancer. Cervical cancer. Colorectal cancer. Skin cancer. Lung cancer. What should I know about heart disease, diabetes, and high blood pressure? Blood pressure and heart  disease High blood pressure causes heart disease and increases the risk of stroke. This is more likely to develop in people who have high blood pressure readings, are of African descent, or are overweight. Have your blood pressure checked: Every 3-5 years if you are 18-39 years of age. Every year if you are 40 years old or older. Diabetes Have regular diabetes screenings. This checks your fasting blood sugar level. Have the screening done: Once every three years after age 40 if you are at a normal weight and have a low risk for diabetes. More often and at a younger age if you are overweight or have a high risk for diabetes. What should I know about preventing infection? Hepatitis B If you have a higher risk for hepatitis B, you should be screened for this virus. Talk with your health care provider to find out if you are at risk forhepatitis B infection. Hepatitis C Testing is recommended for: Everyone born from 1945 through 1965. Anyone with known risk factors for hepatitis C. Sexually transmitted infections (STIs) Get screened for STIs, including gonorrhea and chlamydia, if: You are sexually active and are younger than 52 years of age. You are older than 52 years of age and your health care provider tells you that you are at risk for this type of infection. Your sexual activity has changed since you were last screened, and you are at increased risk for chlamydia or gonorrhea. Ask your health care provider if you are at risk. Ask your health care provider about whether you are at high risk for HIV. Your health care provider may recommend a prescription medicine to help   prevent HIV infection. If you choose to take medicine to prevent HIV, you should first get tested for HIV. You should then be tested every 3 months for as long as you are taking the medicine. Pregnancy If you are about to stop having your period (premenopausal) and you may become pregnant, seek counseling before you get  pregnant. Take 400 to 800 micrograms (mcg) of folic acid every day if you become pregnant. Ask for birth control (contraception) if you want to prevent pregnancy. Osteoporosis and menopause Osteoporosis is a disease in which the bones lose minerals and strength with aging. This can result in bone fractures. If you are 65 years old or older, or if you are at risk for osteoporosis and fractures, ask your health care provider if you should: Be screened for bone loss. Take a calcium or vitamin D supplement to lower your risk of fractures. Be given hormone replacement therapy (HRT) to treat symptoms of menopause. Follow these instructions at home: Lifestyle Do not use any products that contain nicotine or tobacco, such as cigarettes, e-cigarettes, and chewing tobacco. If you need help quitting, ask your health care provider. Do not use street drugs. Do not share needles. Ask your health care provider for help if you need support or information about quitting drugs. Alcohol use Do not drink alcohol if: Your health care provider tells you not to drink. You are pregnant, may be pregnant, or are planning to become pregnant. If you drink alcohol: Limit how much you use to 0-1 drink a day. Limit intake if you are breastfeeding. Be aware of how much alcohol is in your drink. In the [place] one drink equals one 12 oz bottle of beer (355 mL), one 5 oz glass of wine (148 mL), or one 1 oz glass of hard liquor (44 mL). General instructions Schedule regular health, dental, and eye exams. Stay current with your vaccines. Tell your health care provider if: You often feel depressed. You have ever been abused or do not feel safe at home. Summary Adopting a healthy lifestyle and getting preventive care are important in promoting health and wellness. Follow your health care provider's instructions about healthy diet, exercising, and getting tested or screened for diseases. Follow your health care provider's  instructions on monitoring your cholesterol and blood pressure. This information is not intended to replace advice given to you by your health care provider. Make sure you discuss any questions you have with your healthcare provider. Document Revised: 04/23/2018 Document Reviewed: 04/23/2018 Elsevier Patient Education  2022 [employer]  

## 2020-11-09 NOTE — Progress Notes (Signed)
Subjective:    Patient ID: Brittany Durham, female    DOB: Aug 15, 1970, 50 y.o.   MRN: [id number]  Chief Complaint  Patient presents with   Flank Pain    Rt side patient still having scan was good only showed diverticulitis    PT presents to the office today for chronic follow up. Pt is followed by Neurologists for Speech difficulties. She is followed by GI every 6 months for GERD and diverticulitis.   Flank Pain This is a recurrent problem. The current episode started more than 1 month ago. The problem occurs intermittently. The pain is at a severity of 7/10. Pertinent negatives include no dysuria or fever.  Hypertension This is a chronic problem. The current episode started more than 1 year ago. The problem has been resolved since onset. The problem is controlled. Associated symptoms include anxiety and malaise/fatigue. Pertinent negatives include no peripheral edema or shortness of breath. The current treatment provides moderate improvement.  Gastroesophageal Reflux She complains of belching and heartburn. This is a chronic problem. The current episode started more than 1 year ago. The problem occurs occasionally. Risk factors include obesity. She has tried a PPI for the symptoms. The treatment provided moderate relief.  Arthritis Presents for follow-up visit. She complains of pain and stiffness. Affected locations include the left knee, right knee, left MCP and right MCP. Her pain is at a severity of 0/10. Pertinent negatives include no dysuria or fever.  Anemia Presents for follow-up visit. Symptoms include malaise/fatigue. There has been no bruising/bleeding easily or fever.  Anxiety Presents for follow-up visit. Symptoms include depressed mood, excessive worry, nervous/anxious behavior and restlessness. Patient reports no insomnia, obsessions or shortness of breath. Symptoms occur most days. The severity of symptoms is moderate.   Her past medical history is significant for anemia.   Depression        This is a chronic problem.  The current episode started more than 1 year ago.   The onset quality is gradual.   The problem occurs intermittently.  Associated symptoms include restlessness and sad.  Associated symptoms include no helplessness, no hopelessness and does not have insomnia.  Past medical history includes anxiety.      Review of Systems  Constitutional:  Positive for malaise/fatigue. Negative for fever.  Respiratory:  Negative for shortness of breath.   Gastrointestinal:  Positive for heartburn.  Genitourinary:  Positive for flank pain. Negative for dysuria.  Musculoskeletal:  Positive for arthritis and stiffness.  Hematological:  Does not bruise/bleed easily.  Psychiatric/Behavioral:  Positive for depression. The patient is nervous/anxious. The patient does not have insomnia.   All other systems reviewed and are negative.     Objective:   Physical Exam Vitals reviewed.  Constitutional:      General: She is not in acute distress.    Appearance: She is well-developed.  HENT:     Head: Normocephalic and atraumatic.     Right Ear: Tympanic membrane normal.     Left Ear: Tympanic membrane normal.  Eyes:     Pupils: Pupils are equal, round, and reactive to light.  Neck:     Thyroid: No thyromegaly.  Cardiovascular:     Rate and Rhythm: Normal rate and regular rhythm.     Heart sounds: Normal heart sounds. No murmur heard. Pulmonary:     Effort: Pulmonary effort is normal. No respiratory distress.     Breath sounds: Normal breath sounds. No wheezing.  Abdominal:     General: Bowel sounds  are normal. There is no distension.     Palpations: Abdomen is soft.     Tenderness: There is no abdominal tenderness. There is right CVA tenderness.  Musculoskeletal:        General: No tenderness. Normal range of motion.     Cervical back: Normal range of motion and neck supple.  Skin:    General: Skin is warm and dry.  Neurological:     Mental Status: She is  alert and oriented to person, place, and time.     Cranial Nerves: No cranial nerve deficit.     Deep Tendon Reflexes: Reflexes are normal and symmetric.  Psychiatric:        Behavior: Behavior normal.        Thought Content: Thought content normal.        Judgment: Judgment normal.      BP 111/72   Pulse (!) 102   Temp 98.2 F (36.8 C) (Temporal)   Ht 5\' 7"  (1.702 m)   Wt 232 lb 12.8 oz (105.6 kg)   LMP 02/18/2019   SpO2 94%   BMI 36.46 kg/m      Assessment & Plan:  Brittany Durham comes in today with chief complaint of Flank Pain (Rt side patient still having scan was good only showed diverticulitis )   Diagnosis and orders addressed:  1. HYPERTENSION, BENIGN  2. Gastroesophageal reflux disease without esophagitis  3. Primary osteoarthritis involving multiple joints  4. Language difficulty  5. Insomnia, unspecified type  6. GAD (generalized anxiety disorder)  7. Depression, major, single episode, moderate ([place])  8. Symptomatic anemia   Labs pending Health Maintenance reviewed Diet and exercise encouraged  Follow up plan: 3 months for CPE with pap, keep all specialists follow up   Evelina Dun, FNP

## 2020-11-10 DIAGNOSIS — M5412 Radiculopathy, cervical region: Secondary | ICD-10-CM | POA: Diagnosis not present

## 2020-11-14 DIAGNOSIS — Z20822 Contact with and (suspected) exposure to covid-19: Secondary | ICD-10-CM | POA: Diagnosis not present

## 2020-11-16 ENCOUNTER — Other Ambulatory Visit: Payer: Self-pay | Admitting: Family

## 2020-11-16 DIAGNOSIS — Z1231 Encounter for screening mammogram for malignant neoplasm of breast: Secondary | ICD-10-CM

## 2020-12-13 ENCOUNTER — Ambulatory Visit (INDEPENDENT_AMBULATORY_CARE_PROVIDER_SITE_OTHER): Payer: Medicare Other

## 2020-12-13 VITALS — Ht 67.0 in | Wt 240.0 lb

## 2020-12-13 DIAGNOSIS — Z0000 Encounter for general adult medical examination without abnormal findings: Secondary | ICD-10-CM | POA: Diagnosis not present

## 2020-12-13 NOTE — Progress Notes (Signed)
Subjective:   Brittany Durham is a 50 y.o. female who presents for Medicare Annual (Subsequent) preventive examination.  Virtual Visit via Telephone Note  I connected with  Brittany Durham on 12/13/20 at  3:30 PM EDT by telephone and verified that I am speaking with the correct person using two identifiers.  Location: Patient: Home Provider: [facility] Persons participating in the virtual visit: patient/Nurse Health Advisor   I discussed the limitations, risks, security and privacy concerns of performing an evaluation and management service by telephone and the availability of in person appointments. The patient expressed understanding and agreed to proceed.  Interactive audio and video telecommunications were attempted between this nurse and patient, however failed, due to patient having technical difficulties OR patient did not have access to video capability.  We continued and completed visit with audio only.  Some vital signs may be absent or patient reported.   Brittany Durham E Orlandis Sanden, LPN   Review of Systems     Cardiac Risk Factors include: obesity (BMI >30kg/m2);sedentary lifestyle;hypertension;dyslipidemia;Other (see comment), Risk factor comments: anemia     Objective:    Today's Vitals   12/13/20 1536  Weight: 240 lb (108.9 kg)  Height: '5\' 7"'$  (1.702 m)  PainSc: 10-Worst pain ever   Body mass index is 37.59 kg/m.  Advanced Directives 12/13/2020 10/03/2020 09/26/2020 09/20/2020 09/16/2020 08/24/2020 08/11/2020  Does Patient Have a Medical Advance Directive? No Yes Yes No No No Yes  Type of Advance Directive - [place];Living will [place];Living will - - - [place];Living will  Does patient want to make changes to medical advance directive? - No - Patient declined No - Patient declined - - - -  Copy of [place] in Chart? - No - copy requested No - copy requested - - - No - copy requested  Would patient like  information on creating a medical advance directive? No - Patient declined No - Patient declined No - Patient declined No - Patient declined No - Patient declined Yes (MAU/Ambulatory/Procedural Areas - Information given) -    Current Medications (verified) Outpatient Encounter Medications as of 12/13/2020  Medication Sig   amLODipine (NORVASC) 10 MG tablet TAKE 1 TABLET ONCE DAILY (Patient taking differently: Take 10 mg by mouth daily.)   busPIRone (BUSPAR) 5 MG tablet TAKE (1) TABLET UP TO THREE TIMES DAILY.   diclofenac (VOLTAREN) 75 MG EC tablet Take 75 mg by mouth daily.   dicyclomine (BENTYL) 10 MG capsule Take 1 capsule (10 mg total) by mouth 4 (four) times daily -  before meals and at bedtime. For cramping and loose stool. Stop if constipation.   escitalopram (LEXAPRO) 20 MG tablet Take 1 tablet by mouth daily.   ferrous sulfate 325 (65 FE) MG tablet Take 1 tablet (325 mg total) by mouth daily.   ferrous sulfate 325 (65 FE) MG tablet Take 1 tablet by mouth daily.   lisinopril (ZESTRIL) 20 MG tablet TAKE 1 TABLET DAILY (Patient taking differently: Take 20 mg by mouth daily.)   lisinopril (ZESTRIL) 20 MG tablet Take 1 tablet by mouth daily.   meclizine (ANTIVERT) 25 MG tablet Take 1 tablet (25 mg total) by mouth 3 (three) times daily as needed for dizziness.   methocarbamol (ROBAXIN) 500 MG tablet Take by mouth.   pantoprazole (PROTONIX) 40 MG tablet TAKE 1 TABLET ONCE DAILY BEFORE BREAKFAST (Patient taking differently: Take 40 mg by mouth daily.)   tiZANidine (ZANAFLEX) 4 MG tablet Take  by mouth.   Vitamin D, Ergocalciferol, (DRISDOL) 1.25 MG (50000 UNIT) CAPS capsule Take 50,000 Units by mouth once a week.   No facility-administered encounter medications on file as of 12/13/2020.    Allergies (verified) Patient has no known allergies.   History: Past Medical History:  Diagnosis Date   Anemia    Asthma    childhood   Carotidynia    COPD (chronic obstructive pulmonary disease)  ([place])    DDD (degenerative disc disease), cervical    GERD (gastroesophageal reflux disease)    Hypertension    Knee pain    left meniscus tear s/p repair 11/2014   Seizures ([place])    last seizure was over 10 years ago, unknown etiology   Stuttering    patient reports this started after her knee surgery in 11/2014.    Thyroid nodule    Past Surgical History:  Procedure Laterality Date   ANTERIOR CERVICAL DECOMP/DISCECTOMY FUSION N/A 03/20/2019   Procedure: Anterior Cervical Decompression Fusion - Cervical Four-Cervical Five - Cervical Five-Cervical Six;  Surgeon: Eustace Moore, MD;  Location: [facility];  Service: Neurosurgery;  Laterality: N/A;  Anterior Cervical Decompression Fusion - Cervical Four-Cervical Five - Cervical Five-Cervical Six   BIOPSY  09/20/2020   Procedure: BIOPSY;  Surgeon: Daneil Dolin, MD;  [facility];  Service: Endoscopy;;   COLONOSCOPY N/A 08/23/2016   Procedure: COLONOSCOPY;  Surgeon: Daneil Dolin, MD;  [facility];  Service: Endoscopy;  Laterality: N/A;  1:00pm   COLONOSCOPY N/A 08/24/2020   Diverticulosis in the entire examined colon. Redundant colon. Exam otherwise normal   ESOPHAGOGASTRODUODENOSCOPY N/A 05/03/2016   Procedure: ESOPHAGOGASTRODUODENOSCOPY (EGD);  Surgeon: Daneil Dolin, MD;  [facility];  Service: Endoscopy;  Laterality: N/A;   ESOPHAGOGASTRODUODENOSCOPY N/A 08/23/2016   Procedure: ESOPHAGOGASTRODUODENOSCOPY (EGD);  Surgeon: Daneil Dolin, MD;  [facility];  Service: Endoscopy;  Laterality: N/A;   ESOPHAGOGASTRODUODENOSCOPY (EGD) WITH PROPOFOL N/A 09/20/2020   normal esophagus. Large hiatal hernia. Multiple gastric erosions (Cameron lesions). Normal duodenal bulb and second portion of the duodenum. Suspicion Lysbeth Galas lesions causing intermittent bleeding   KNEE ARTHROSCOPY Right    KNEE ARTHROSCOPY WITH MEDIAL MENISECTOMY Left 12/09/2014   Procedure: KNEE ARTHROSCOPY WITH PARTIAL MEDIAL AND  LATERAL MENISECTOMY;  Surgeon: Sanjuana Kava, MD;  Location: [facility];  Service: Orthopedics;  Laterality: Left;   TUBAL LIGATION     WISDOM TOOTH EXTRACTION     Family History  Problem Relation Age of Onset   Hypertension Mother    Lung cancer Father    Hypertension Brother    Colon cancer Neg Hx    Gastric cancer Neg Hx    Esophageal cancer Neg Hx    Social History   Socioeconomic History   Marital status: Single    Spouse name: Not on file   Number of children: 1   Years of education: 12   Highest education level: Not on file  Occupational History   Occupation: Umemployed    Comment: Disability  Tobacco Use   Smoking status: Never   Smokeless tobacco: Never  Vaping Use   Vaping Use: Never used  Substance and Sexual Activity   Alcohol use: No   Drug use: No   Sexual activity: Not Currently    Birth control/protection: Surgical  Other Topics Concern   Not on file  Social History Narrative   Lives at home alone.   Disabled - speech difficulties, cannot drive   Right-handed.  Occasional caffeine use.   Her daughter lives about 30-45 minutes away   She has lots of family nearby - uncle, mother, sister, brother   Social Determinants of Radio broadcast assistant Strain: Low Risk    Difficulty of Paying Living Expenses: Not hard at all  Food Insecurity: No Food Insecurity   Worried About Charity fundraiser in the Last Year: Never true   Arboriculturist in the Last Year: Never true  Transportation Needs: No Transportation Needs   Lack of Transportation (Medical): No   Lack of Transportation (Non-Medical): No  Physical Activity: Insufficiently Active   Days of Exercise per Week: 2 days   Minutes of Exercise per Session: 30 min  Stress: No Stress Concern Present   Feeling of Stress : Not at all  Social Connections: Moderately Integrated   Frequency of Communication with Friends and Family: More than three times a week   Frequency of Social Gatherings with  Friends and Family: More than three times a week   Attends Religious Services: More than 4 times per year   Active Member of [employer] or Organizations: Yes   Attends Music therapist: More than 4 times per year   Marital Status: Never married    Tobacco Counseling Counseling given: Not Answered   Clinical Intake:  Pre-visit preparation completed: Yes  Pain : 0-10 Pain Score: 10-Worst pain ever Pain Type: Acute pain, Intractable pain Pain Location: Flank Pain Orientation: Right Pain Descriptors / Indicators: Sharp, Restless, Crying, Grimacing, Tender Pain Onset: In the past 7 days Pain Frequency: Constant     BMI - recorded: 37.59 Nutritional Status: BMI > 30  Obese Nutritional Risks: Nausea/ vomitting/ diarrhea (diarrhea - is taking medication for this which helps) Diabetes: No  How often do you need to have someone help you when you read instructions, pamphlets, or other written materials from your doctor or pharmacy?: 1 - Never  Diabetic? No  Interpreter Needed?: No  Information entered by :: Wyley Hack, LPN   Activities of Daily Living In your present state of health, do you have any difficulty performing the following activities: 12/13/2020 09/16/2020  Hearing? N N  Vision? N N  Difficulty concentrating or making decisions? Y N  Walking or climbing stairs? Y Y  Comment - bilateral knee pain  Dressing or bathing? N N  Doing errands, shopping? Y N  Comment she doesn't drive -  Conservation officer, nature and eating ? N -  Using the Toilet? N -  In the past six months, have you accidently leaked urine? N -  Do you have problems with loss of bowel control? N -  Managing your Medications? N -  Managing your Finances? N -  Housekeeping or managing your Housekeeping? N -  Some recent data might be hidden    Patient Care Team: Sharion Balloon, FNP as PCP - General (Nurse Practitioner) Buford Dresser, MD as PCP - Cardiology (Cardiology) Gala Romney Cristopher Estimable, MD  as Consulting Physician (Gastroenterology) Annitta Needs, NP (Gastroenterology) Konrad Saha as Physician Assistant (Oncology)  Indicate any recent Medical Services you may have received from other than [facility] providers in the past year (date may be approximate).     Assessment:   This is a routine wellness examination for Brittany Durham.  Hearing/Vision screen Hearing Screening - Comments:: Denies hearing difficulties  Vision Screening - Comments:: Wears eyeglasses - up to date with annual eye exam - can't remember optometrist name  Dietary  issues and exercise activities discussed: Current Exercise Habits: Home exercise routine, Type of exercise: walking, Time (Minutes): 30, Frequency (Times/Week): 2, Weekly Exercise (Minutes/Week): 60, Intensity: Mild, Exercise limited by: neurologic condition(s)   Goals Addressed             This Visit's Progress    Exercise 3x per week (30 min per time)   Not on track    Pullman a shower chair and elevated toilet seat Use a cane or walker for added support Do seated exercises to strengthen legs Try the balance and strength exercises       Depression Screen PHQ 2/9 Scores 12/13/2020 11/09/2020 07/15/2020 07/15/2020 07/15/2020 01/26/2020 12/09/2019  PHQ - 2 Score '1 1 6 '$ 0 0 0 0  PHQ- 9 Score '5 3 21 '$ - - - -    Fall Risk Fall Risk  12/13/2020 11/09/2020 07/15/2020 07/15/2020 01/26/2020  Falls in the past year? '1 1 1 '$ 0 0  Number falls in past yr: '1 1 1 '$ - -  Comment - - - - -  Injury with Fall? 1 1 0 - -  Comment - - - - -  Risk for fall due to : History of fall(s);Impaired balance/gait;Orthopedic patient;Impaired vision;Medication side effect History of fall(s) History of fall(s);Impaired balance/gait - -  Follow up Education provided;Falls prevention discussed - Falls evaluation completed - -    FALL RISK PREVENTION PERTAINING TO THE HOME:  Any stairs in or around the home? No  If so, are there any without  handrails? No  Home free of loose throw rugs in walkways, pet beds, electrical cords, etc? Yes  Adequate lighting in your home to reduce risk of falls? Yes   ASSISTIVE DEVICES UTILIZED TO PREVENT FALLS:  Life alert? No  Use of a cane, walker or w/c? No  Grab bars in the bathroom? Yes  Shower chair or bench in shower? No  Elevated toilet seat or a handicapped toilet? No   TIMED UP AND GO:  Was the test performed? No . Telephonic visit  Cognitive Function:     6CIT Screen 12/13/2020 12/09/2019 12/09/2019 10/29/2018  What Year? 0 points - 0 points 0 points  What month? 0 points - 0 points 0 points  What time? 0 points - 0 points 0 points  Count back from 20 0 points - 0 points 0 points  Months in reverse 0 points 2 points 0 points 0 points  Repeat phrase 0 points - 0 points 0 points  Total Score 0 - 0 0    Immunizations Immunization History  Administered Date(s) Administered   Moderna Sars-Covid-2 Vaccination 07/25/2019, 08/26/2019    TDAP status: Due, Education has been provided regarding the importance of this vaccine. Advised may receive this vaccine at local pharmacy or Health Dept. Aware to provide a copy of the vaccination record if obtained from local pharmacy or Health Dept. Verbalized acceptance and understanding.  Flu Vaccine status: Due, Education has been provided regarding the importance of this vaccine. Advised may receive this vaccine at local pharmacy or Health Dept. Aware to provide a copy of the vaccination record if obtained from local pharmacy or Health Dept. Verbalized acceptance and understanding.  Pneumococcal vaccine status: Due, Education has been provided regarding the importance of this vaccine. Advised may receive this vaccine at local pharmacy or Health Dept. Aware to provide a copy of the vaccination record if obtained from local pharmacy or Health Dept.  Verbalized acceptance and understanding.  Covid-19 vaccine status: Completed vaccines  Qualifies  for Shingles Vaccine? Yes   Zostavax completed No   Shingrix Completed?: No.    Education has been provided regarding the importance of this vaccine. Patient has been advised to call insurance company to determine out of pocket expense if they have not yet received this vaccine. Advised may also receive vaccine at local pharmacy or Health Dept. Verbalized acceptance and understanding.  Screening Tests Health Maintenance  Topic Date Due   COVID-19 Vaccine (3 - Moderna risk series) 09/23/2019   PAP SMEAR-Modifier  04/30/2020   INFLUENZA VACCINE  12/12/2020   Zoster Vaccines- Shingrix (1 of 2) 02/09/2021 (Originally 11/12/1987)   Pneumococcal Vaccine 46-41 Years old (1 - PCV) 11/09/2021 (Originally 11/12/1974)   MAMMOGRAM  11/09/2021 (Originally 11/12/1986)   TETANUS/TDAP  11/09/2021 (Originally 11/12/1987)   COLONOSCOPY (Pts 45-70yr Insurance coverage will need to be confirmed)  08/25/2030   Hepatitis C Screening  Completed   HIV Screening  Completed   HPV VACCINES  Aged Out    Health Maintenance  Health Maintenance Due  Topic Date Due   COVID-19 Vaccine (3 - Moderna risk series) 09/23/2019   PAP SMEAR-Modifier  04/30/2020   INFLUENZA VACCINE  12/12/2020    Colorectal cancer screening: Type of screening: Colonoscopy. Completed 08/24/2020. Repeat every 10 years  Mammogram status: Ordered 12/2020. Pt provided with contact info and advised to call to schedule appt.  She has an appt 01/23/21  Lung Cancer Screening: (Low Dose CT Chest recommended if Age 52-80years, 30 pack-year currently smoking OR have quit w/in 15years.) does not qualify.   Additional Screening:  Hepatitis C Screening: does not qualify; Completed 01/14/2020  Vision Screening: Recommended annual ophthalmology exams for early detection of glaucoma and other disorders of the eye. Is the patient up to date with their annual eye exam?  Yes  Who is the provider or what is the name of the office in which the patient attends annual  eye exams? unknown If pt is not established with a provider, would they like to be referred to a provider to establish care? No .   Dental Screening: Recommended annual dental exams for proper oral hygiene  Community Resource Referral / Chronic Care Management: CRR required this visit?  No   CCM required this visit?  No      Plan:     I have personally reviewed and noted the following in the patient's chart:   Medical and social history Use of alcohol, tobacco or illicit drugs  Current medications and supplements including opioid prescriptions.  Functional ability and status Nutritional status Physical activity Advanced directives List of other physicians Hospitalizations, surgeries, and ER visits in previous 12 months Vitals Screenings to include cognitive, depression, and falls Referrals and appointments  In addition, I have reviewed and discussed with patient certain preventive protocols, quality metrics, and best practice recommendations. A written personalized care plan for preventive services as well as general preventive health recommendations were provided to patient.     ASandrea Hammond LPN   [id number]  Nurse Notes: None

## 2020-12-13 NOTE — Patient Instructions (Signed)
Brittany Durham , Thank you for taking time to come for your Medicare Wellness Visit. I appreciate your ongoing commitment to your health goals. Please review the following plan we discussed and let me know if I can assist you in the future.   Screening recommendations/referrals: Colonoscopy: Done 08/24/20 - Repeat in 10 years Mammogram: Due. Keep appointment for 01/23/21 Bone Density: Due at age 50 Recommended yearly ophthalmology/optometry visit for glaucoma screening and checkup Recommended yearly dental visit for hygiene and checkup  Vaccinations: Influenza vaccine: Due every fall Pneumococcal vaccine: Due - 2 vaccines one year apart Tdap vaccine: Due every 10 years Shingles vaccine: Due. 2 doses 2-6 months apart  Covid-19: Done 07/25/19 & 08/26/19  Advanced directives: Please bring a copy of your health care power of attorney and living will to the office to be added to your chart at your convenience.   Conditions/risks identified: Do your best to prevent falls - get a cane or walker for added support. Get a shower chair, elevated toilet seat. Get rid of throw rugs. Avoid stairs. Try balance and strength exercises.  Next appointment: Follow up in one year for your annual wellness visit.   Preventive Care 40-64 Years, Female Preventive care refers to lifestyle choices and visits with your health care provider that can promote health and wellness. What does preventive care include? A yearly physical exam. This is also called an annual well check. Dental exams once or twice a year. Routine eye exams. Ask your health care provider how often you should have your eyes checked. Personal lifestyle choices, including: Daily care of your teeth and gums. Regular physical activity. Eating a healthy diet. Avoiding tobacco and drug use. Limiting alcohol use. Practicing safe sex. Taking low-dose aspirin daily starting at age 1. Taking vitamin and mineral supplements as recommended by your health  care provider. What happens during an annual well check? The services and screenings done by your health care provider during your annual well check will depend on your age, overall health, lifestyle risk factors, and family history of disease. Counseling  Your health care provider may ask you questions about your: Alcohol use. Tobacco use. Drug use. Emotional well-being. Home and relationship well-being. Sexual activity. Eating habits. Work and work Statistician. Method of birth control. Menstrual cycle. Pregnancy history. Screening  You may have the following tests or measurements: Height, weight, and BMI. Blood pressure. Lipid and cholesterol levels. These may be checked every 5 years, or more frequently if you are over 41 years old. Skin check. Lung cancer screening. You may have this screening every year starting at age 37 if you have a 30-pack-year history of smoking and currently smoke or have quit within the past 15 years. Fecal occult blood test (FOBT) of the stool. You may have this test every year starting at age 71. Flexible sigmoidoscopy or colonoscopy. You may have a sigmoidoscopy every 5 years or a colonoscopy every 10 years starting at age 65. Hepatitis C blood test. Hepatitis B blood test. Sexually transmitted disease (STD) testing. Diabetes screening. This is done by checking your blood sugar (glucose) after you have not eaten for a while (fasting). You may have this done every 1-3 years. Mammogram. This may be done every 1-2 years. Talk to your health care provider about when you should start having regular mammograms. This may depend on whether you have a family history of breast cancer. BRCA-related cancer screening. This may be done if you have a family history of breast, ovarian, tubal, or  peritoneal cancers. Pelvic exam and Pap test. This may be done every 3 years starting at age 46. Starting at age 68, this may be done every 5 years if you have a Pap test in  combination with an HPV test. Bone density scan. This is done to screen for osteoporosis. You may have this scan if you are at high risk for osteoporosis. Discuss your test results, treatment options, and if necessary, the need for more tests with your health care provider. Vaccines  Your health care provider may recommend certain vaccines, such as: Influenza vaccine. This is recommended every year. Tetanus, diphtheria, and acellular pertussis (Tdap, Td) vaccine. You may need a Td booster every 10 years. Zoster vaccine. You may need this after age 92. Pneumococcal 13-valent conjugate (PCV13) vaccine. You may need this if you have certain conditions and were not previously vaccinated. Pneumococcal polysaccharide (PPSV23) vaccine. You may need one or two doses if you smoke cigarettes or if you have certain conditions. Talk to your health care provider about which screenings and vaccines you need and how often you need them. This information is not intended to replace advice given to you by your health care provider. Make sure you discuss any questions you have with your health care provider. Document Released: 05/27/2015 Document Revised: 01/18/2016 Document Reviewed: 03/01/2015 Elsevier Interactive Patient Education  2017 [employer] Prevention in the Home Falls can cause injuries. They can happen to people of all ages. There are many things you can do to make your home safe and to help prevent falls. What can I do on the outside of my home? Regularly fix the edges of walkways and driveways and fix any cracks. Remove anything that might make you trip as you walk through a door, such as a raised step or threshold. Trim any bushes or trees on the path to your home. Use bright outdoor lighting. Clear any walking paths of anything that might make someone trip, such as rocks or tools. Regularly check to see if handrails are loose or broken. Make sure that both sides of any steps have  handrails. Any raised decks and porches should have guardrails on the edges. Have any leaves, snow, or ice cleared regularly. Use sand or salt on walking paths during winter. Clean up any spills in your garage right away. This includes oil or grease spills. What can I do in the bathroom? Use night lights. Install grab bars by the toilet and in the tub and shower. Do not use towel bars as grab bars. Use non-skid mats or decals in the tub or shower. If you need to sit down in the shower, use a plastic, non-slip stool. Keep the floor dry. Clean up any water that spills on the floor as soon as it happens. Remove soap buildup in the tub or shower regularly. Attach bath mats securely with double-sided non-slip rug tape. Do not have throw rugs and other things on the floor that can make you trip. What can I do in the bedroom? Use night lights. Make sure that you have a light by your bed that is easy to reach. Do not use any sheets or blankets that are too big for your bed. They should not hang down onto the floor. Have a firm chair that has side arms. You can use this for support while you get dressed. Do not have throw rugs and other things on the floor that can make you trip. What can I do in  the kitchen? Clean up any spills right away. Avoid walking on wet floors. Keep items that you use a lot in easy-to-reach places. If you need to reach something above you, use a strong step stool that has a grab bar. Keep electrical cords out of the way. Do not use floor polish or wax that makes floors slippery. If you must use wax, use non-skid floor wax. Do not have throw rugs and other things on the floor that can make you trip. What can I do with my stairs? Do not leave any items on the stairs. Make sure that there are handrails on both sides of the stairs and use them. Fix handrails that are broken or loose. Make sure that handrails are as long as the stairways. Check any carpeting to make sure  that it is firmly attached to the stairs. Fix any carpet that is loose or worn. Avoid having throw rugs at the top or bottom of the stairs. If you do have throw rugs, attach them to the floor with carpet tape. Make sure that you have a light switch at the top of the stairs and the bottom of the stairs. If you do not have them, ask someone to add them for you. What else can I do to help prevent falls? Wear shoes that: Do not have high heels. Have rubber bottoms. Are comfortable and fit you well. Are closed at the toe. Do not wear sandals. If you use a stepladder: Make sure that it is fully opened. Do not climb a closed stepladder. Make sure that both sides of the stepladder are locked into place. Ask someone to hold it for you, if possible. Clearly mark and make sure that you can see: Any grab bars or handrails. First and last steps. Where the edge of each step is. Use tools that help you move around (mobility aids) if they are needed. These include: Canes. Walkers. Scooters. Crutches. Turn on the lights when you go into a dark area. Replace any light bulbs as soon as they burn out. Set up your furniture so you have a clear path. Avoid moving your furniture around. If any of your floors are uneven, fix them. If there are any pets around you, be aware of where they are. Review your medicines with your doctor. Some medicines can make you feel dizzy. This can increase your chance of falling. Ask your doctor what other things that you can do to help prevent falls. This information is not intended to replace advice given to you by your health care provider. Make sure you discuss any questions you have with your health care provider. Document Released: 02/24/2009 Document Revised: 10/06/2015 Document Reviewed: 06/04/2014 Elsevier Interactive Patient Education  2017 [employer].

## 2020-12-15 ENCOUNTER — Other Ambulatory Visit: Payer: Self-pay | Admitting: Family

## 2020-12-15 DIAGNOSIS — R5381 Other malaise: Secondary | ICD-10-CM

## 2020-12-15 NOTE — Progress Notes (Signed)
RX sent for  shower chair or elevated toilet seat.

## 2020-12-16 ENCOUNTER — Other Ambulatory Visit: Payer: Self-pay

## 2020-12-16 ENCOUNTER — Ambulatory Visit (INDEPENDENT_AMBULATORY_CARE_PROVIDER_SITE_OTHER): Payer: Medicare Other | Admitting: Gastroenterology

## 2020-12-16 ENCOUNTER — Encounter: Payer: Self-pay | Admitting: Gastroenterology

## 2020-12-16 ENCOUNTER — Inpatient Hospital Stay (HOSPITAL_COMMUNITY): Payer: Medicare Other | Attending: Hematology

## 2020-12-16 ENCOUNTER — Other Ambulatory Visit: Payer: Self-pay | Admitting: Gastroenterology

## 2020-12-16 ENCOUNTER — Other Ambulatory Visit (HOSPITAL_COMMUNITY)
Admission: RE | Admit: 2020-12-16 | Discharge: 2020-12-16 | Disposition: A | Discharge status: Home / Self Care | Payer: Medicare Other | Source: Ambulatory Visit | Attending: Gastroenterology | Admitting: Gastroenterology

## 2020-12-16 VITALS — BP 131/87 | HR 85 | Temp 98.0°F | Ht 67.0 in | Wt 230.4 lb

## 2020-12-16 DIAGNOSIS — K5792 Diverticulitis of intestine, part unspecified, without perforation or abscess without bleeding: Secondary | ICD-10-CM

## 2020-12-16 DIAGNOSIS — D509 Iron deficiency anemia, unspecified: Secondary | ICD-10-CM | POA: Diagnosis not present

## 2020-12-16 DIAGNOSIS — R109 Unspecified abdominal pain: Secondary | ICD-10-CM

## 2020-12-16 DIAGNOSIS — D500 Iron deficiency anemia secondary to blood loss (chronic): Secondary | ICD-10-CM

## 2020-12-16 DIAGNOSIS — E876 Hypokalemia: Secondary | ICD-10-CM

## 2020-12-16 LAB — COMPREHENSIVE METABOLIC PANEL
ALT: 16 U/L (ref 0–44)
AST: 14 U/L — ABNORMAL LOW (ref 15–41)
Albumin: 4.1 g/dL (ref 3.5–5.0)
Alkaline Phosphatase: 82 U/L (ref 38–126)
Anion gap: 8 (ref 5–15)
BUN: 14 mg/dL (ref 6–20)
CO2: 28 mmol/L (ref 22–32)
Calcium: 9.1 mg/dL (ref 8.9–10.3)
Chloride: 102 mmol/L (ref 98–111)
Creatinine, Ser: 0.69 mg/dL (ref 0.44–1.00)
GFR, Estimated: >60 mL/min (ref >60)
Glucose, Bld: 144 mg/dL — ABNORMAL HIGH (ref 70–99)
Potassium: 3.3 mmol/L — ABNORMAL LOW (ref 3.5–5.1)
Sodium: 138 mmol/L (ref 135–145)
Total Bilirubin: 0.5 mg/dL (ref 0.3–1.2)
Total Protein: 7.5 g/dL (ref 6.5–8.1)

## 2020-12-16 LAB — URINALYSIS, ROUTINE W REFLEX MICROSCOPIC
Bilirubin Urine: NEGATIVE
Glucose, UA: NEGATIVE mg/dL
Hgb urine dipstick: NEGATIVE
Ketones, ur: NEGATIVE mg/dL
Leukocytes,Ua: NEGATIVE
Nitrite: NEGATIVE
Protein, ur: NEGATIVE mg/dL
Specific Gravity, Urine: 1.027 (ref 1.005–1.030)
pH: 6 (ref 5.0–8.0)

## 2020-12-16 LAB — CBC WITH DIFFERENTIAL/PLATELET
Abs Immature Granulocytes: 0.03 10*3/uL (ref 0.00–0.07)
Basophils Absolute: 0 10*3/uL (ref 0.0–0.1)
Basophils Relative: 0 %
Eosinophils Absolute: 0.1 10*3/uL (ref 0.0–0.5)
Eosinophils Relative: 2 %
HCT: 36.8 % (ref 36.0–46.0)
Hemoglobin: 11.9 g/dL — ABNORMAL LOW (ref 12.0–15.0)
Immature Granulocytes: 1 %
Lymphocytes Relative: 35 %
Lymphs Abs: 1.9 10*3/uL (ref 0.7–4.0)
MCH: 30.3 pg (ref 26.0–34.0)
MCHC: 32.3 g/dL (ref 30.0–36.0)
MCV: 93.6 fL (ref 80.0–100.0)
Monocytes Absolute: 0.3 10*3/uL (ref 0.1–1.0)
Monocytes Relative: 6 %
Neutro Abs: 3.1 10*3/uL (ref 1.7–7.7)
Neutrophils Relative %: 56 %
Platelets: 201 10*3/uL (ref 150–400)
RBC: 3.93 MIL/uL (ref 3.87–5.11)
RDW: 13.2 % (ref 11.5–15.5)
WBC: 5.5 10*3/uL (ref 4.0–10.5)
nRBC: 0 % (ref 0.0–0.2)

## 2020-12-16 LAB — LIPASE, BLOOD: Lipase: 28 U/L (ref 11–51)

## 2020-12-16 LAB — IRON AND TIBC
Iron: 59 ug/dL (ref 28–170)
Saturation Ratios: 16 % (ref 10.4–31.8)
TIBC: 363 ug/dL (ref 250–450)
UIBC: 304 ug/dL

## 2020-12-16 LAB — FERRITIN: Ferritin: 43 ng/mL (ref 11–307)

## 2020-12-16 MED ORDER — AMOXICILLIN-POT CLAVULANATE 875-125 MG PO TABS: 1.0000 | ORAL_TABLET | Freq: Two times a day (BID) | ORAL | 0 refills | Status: AC

## 2020-12-16 MED ORDER — POTASSIUM CHLORIDE CRYS ER 20 MEQ PO TBCR: 20.0000 meq | EXTENDED_RELEASE_TABLET | Freq: Two times a day (BID) | ORAL | 0 refills | Status: DC

## 2020-12-16 NOTE — Progress Notes (Signed)
Sending in Rx's at request of Neil Crouch, PA for suspected recurrent diverticulitis and hypokalemia. Recommended Augmentin 875/125 BID x 10 days and Potassium Chloride 20 mg BID x 2 days.

## 2020-12-16 NOTE — Addendum Note (Signed)
Addended by: Mahala Menghini on: 12/16/2020 10:24 AM   Modules accepted: Orders

## 2020-12-16 NOTE — Patient Instructions (Addendum)
Please have labs and urine test done today. We will be in touch when results return. Hopefully later today. Stop diclofenac, this can contribute to your anemia and bleeding from your stomach. If your symptoms/pain worsen, go to the ER.

## 2020-12-16 NOTE — Progress Notes (Signed)
Primary Care Physician: Sharion Balloon, FNP  Primary Gastroenterologist:  Garfield Cornea, MD   Chief Complaint  Patient presents with   Abdominal Pain    All along right side, x 1 week, constant   Nausea    No vomiting    HPI: Brittany Durham is a 50 y.o. female here for urgent visit for abdominal pain. Patient last seen in June 2022.  History of GERD, iron deficiency anemia, diarrhea.  Patient presents today complaining of recurrent right flank pain.  Pain has been intermittent for several months.  Work-up has included 2 CTs, lumbar film labs, UA.  CT renal stone protocol in May with suspicion of diverticulitis, she was treated with Omnicef and Flagyl, 10-day course.  No improvement in flank pain.  Seen by PCP who gave injection of Rocephin along with 7-day course of Cipro and Flagyl.  Patient states that her symptoms somewhat improved but never went away.  Last week symptoms started flaring again.  Was woke up with severe right flank pain. No known injury. Hurts with walking, eating. Can't sleep. Does not feel like eating. Nausea but no vomiting. No fever. Dicyclomine helping diarrhea, does not help abdominal pain. Having two BMs a day. Stools soft. Taking dicyclomine twice daily. No melena, brbpr. Urinary urgency but no dysuria, hematuria. No period for five years but had a period last week.   After last office visit C. difficile was negative.  She was started on trial of dicyclomine.  Labs from June 2022 with normal CMET, hemoglobin 12.6.  Last ferritin 23 in May 2022.  Scheduled for labs with hematology today for follow-up of IDA.  CT abdomen pelvis with and without contrast 06/27/2020: IMPRESSION: 1. No acute findings are noted in the abdomen or pelvis to account for the patient's symptoms. 2. Colonic diverticulosis without evidence of acute diverticulitis at this time. 3. Fibroid uterus. 4. Large hiatal hernia. 5. Aortic atherosclerosis.  Colonoscopy April 2022:  Diverticulosis in the entire examined colon.  Redundant colon.  Exam otherwise normal.  Screening colonoscopy in 2032.  EGD May 2022: Large hiatal hernia, multiple gastric erosions (Cameron lesions).  Suspicion of Lysbeth Galas lesions causing intermittent bleeding in the setting of ongoing NSAID use (Voltaren).  Negative for H. pylori.  Current Outpatient Medications  Medication Sig Dispense Refill   amLODipine (NORVASC) 10 MG tablet TAKE 1 TABLET ONCE DAILY (Patient taking differently: Take 10 mg by mouth daily.) 90 tablet 1   busPIRone (BUSPAR) 5 MG tablet TAKE (1) TABLET UP TO THREE TIMES DAILY. 90 tablet 2   Cholecalciferol (D 2000) 50 MCG (2000 UT) TABS Take by mouth daily.     diclofenac (VOLTAREN) 75 MG EC tablet Take 75 mg by mouth daily.     dicyclomine (BENTYL) 10 MG capsule Take 1 capsule (10 mg total) by mouth 4 (four) times daily -  before meals and at bedtime. For cramping and loose stool. Stop if constipation. 120 capsule 3   docusate sodium (COLACE) 100 MG capsule Take 100 mg by mouth daily.     escitalopram (LEXAPRO) 20 MG tablet Take 1 tablet by mouth daily.     ferrous sulfate 325 (65 FE) MG tablet Take 1 tablet by mouth daily.     lisinopril (ZESTRIL) 20 MG tablet TAKE 1 TABLET DAILY (Patient taking differently: Take 20 mg by mouth daily.) 90 tablet 1   meclizine (ANTIVERT) 25 MG tablet Take 1 tablet (25 mg total) by mouth 3 (three) times daily as  needed for dizziness. 15 tablet 0   methocarbamol (ROBAXIN) 500 MG tablet Take by mouth every 8 (eight) hours as needed.     pantoprazole (PROTONIX) 40 MG tablet TAKE 1 TABLET ONCE DAILY BEFORE BREAKFAST (Patient taking differently: Take 40 mg by mouth daily.) 30 tablet 5   Vitamin D, Ergocalciferol, (DRISDOL) 1.25 MG (50000 UNIT) CAPS capsule Take 50,000 Units by mouth once a week.     No current facility-administered medications for this visit.    Allergies as of 12/16/2020   (No Known Allergies)    ROS:  General: Negative for  anorexia, weight loss, fever, chills, fatigue, weakness. ENT: Negative for hoarseness, difficulty swallowing , nasal congestion. CV: Negative for chest pain, angina, palpitations, dyspnea on exertion, peripheral edema.  Respiratory: Negative for dyspnea at rest, dyspnea on exertion, cough, sputum, wheezing.  GI: See history of present illness. GU:  Negative for dysuria, hematuria, urinary incontinence,   nocturnal urination.  See HPI Endo: Negative for unusual weight change.    Physical Examination:   BP 131/87   Pulse 85   Temp 98 F (36.7 C)   Ht '5\' 7"'$  (1.702 m)   Wt 230 lb 6.4 oz (104.5 kg)   LMP 02/18/2019   BMI 36.09 kg/m   General: Well-nourished, well-developed in no acute distress.  Eyes: No icterus. Mouth: masked. Lungs: Clear to auscultation bilaterally.  Heart: Regular rate and rhythm, no murmurs rubs or gallops.  Abdomen: Bowel sounds are normal,  nondistended, no hepatosplenomegaly or masses, no abdominal bruits or hernia , no rebound or guarding.  Moderate tenderness with palpation in the right CVA/flank area but extending into RUQ. Abd very soft. No rashes.  Extremities: No lower extremity edema. No clubbing or deformities. Neuro: Alert and oriented x 4   Skin: Warm and dry, no jaundice.   Psych: Alert and cooperative, normal mood and affect.  Labs:  Lab Results  Component Value Date   WBC 7.2 10/24/2020   HGB 12.6 10/24/2020   HCT 39.0 10/24/2020   MCV 88 10/24/2020   PLT 277 10/24/2020   Lab Results  Component Value Date   IRON 40 09/13/2020   TIBC 436 09/13/2020   FERRITIN 23 09/13/2020   Lab Results  Component Value Date   ALT 17 10/24/2020   AST 13 10/24/2020   ALKPHOS 102 10/24/2020   BILITOT 0.3 10/24/2020   Lab Results  Component Value Date   CREATININE 0.76 10/24/2020   BUN 11 10/24/2020   NA 141 10/24/2020   K 4.2 10/24/2020   CL 98 10/24/2020   CO2 28 10/24/2020     Imaging Studies: No results found.   Assessment:  Right  flank pain: Intermittent for couple of months.  Suspected to have diverticulitis of the ascending colon on CT in May.  Treated with 2 different rounds of antibiotics, patient noted minimal improvement however she did note that her pain seemed to have lessened the month of July.  No evidence of rash to suggest shingles.  Moderate tenderness noted with minimal palpation on exam, extending from the right upper quadrant to the right flank and right CVA area.  Musculoskeletal remains on the differential, UTI/renal stone as well.  Given widespread nature of her pain, I doubt we are dealing with diverticulitis but cannot exclude.  IDA: Continues to follow with hematology.  Denies any overt GI bleeding, hematuria.  She had a menstrual cycle last week, first time in the past 5 years.  She is due for labs  today with hematology.  Follow-up visit next week.  Diclofenac is on her list today, she was provided instructions to stop medication.  GERD: Doing well on pantoprazole.  Diarrhea: Seems to have resolved.  Doing well on dicyclomine.  Plan: Stop diclofenac. Continue pantoprazole daily. Continue dicyclomine up to 4 times daily for diarrhea. Sent for stat labs, she will also get IDA labs today.  Check UA with culture reflex.  He asked that I contact her daughter Darcella Cheshire with results. If she has worsening pain in the interim, she should go to the ED.

## 2020-12-19 ENCOUNTER — Other Ambulatory Visit: Payer: Self-pay | Admitting: *Deleted

## 2020-12-19 DIAGNOSIS — R109 Unspecified abdominal pain: Secondary | ICD-10-CM

## 2020-12-19 LAB — METHYLMALONIC ACID, SERUM: Methylmalonic Acid, Quantitative: 162 nmol/L (ref 0–378)

## 2020-12-19 LAB — COPPER, SERUM: Copper: 95 ug/dL (ref 80–158)

## 2020-12-22 NOTE — Progress Notes (Signed)
[facility], [place] [address]   CLINIC:  Medical Oncology/Hematology  PCP:  Sharion Balloon, [place] [address] [phone]   REASON FOR VISIT:  Follow-up for iron deficiency anemia  CURRENT THERAPY: Intermittent IV iron  INTERVAL HISTORY:  Brittany Durham 50 y.o. female returns for routine follow-up of iron deficiency anemia, suspected to be from occult GI bleeding.  She was last seen by Tarri Abernethy PA-C on 09/15/2020.  At today's visit, she reports feeling fair..  No recent hospitalizations, surgeries, or changes in baseline health status.  Patient denies any signs of GI blood loss such as hematemesis, hematochezia, or melena.  However, she reports that she had vaginal bleeding for 5 days last month, and says that she had not had a period since 2017.  She has discussed this with her gynecologist, who is following.  Her biggest concern at today's appointment is her persistent right-sided flank pain.  She had this for about 2 months in April/May 2022, but it resolved on its own.  The pain has recurred in the last week of July and has been unrelenting since that time.  It is described as a sharp pain, 10 out of 10, localized to her right flank without radiation.  No aggravating or alleviating factors.  Pain has been worked up by her PCP and her GI specialist, but without clear cause.  She has been referred to another specialist in [place], with an appointment coming up on 12/27/2020.  She reports that the pain has increased her fatigue, she currently reports her energy is about 60%.  However, she does report that after receiving IV iron her fatigue had improved.  No chest pain, dyspnea, palpitations, syncope.  She has 60% energy and 75% appetite. She endorses that she is maintaining a stable weight.    REVIEW OF SYSTEMS:  Review of Systems  Constitutional:  Positive for fatigue. Negative for appetite change, chills,  diaphoresis, fever and unexpected weight change.  HENT:   Negative for lump/mass and nosebleeds.   Eyes:  Negative for eye problems.  Respiratory:  Negative for cough, hemoptysis and shortness of breath.   Cardiovascular:  Negative for chest pain, leg swelling and palpitations.  Gastrointestinal:  Positive for abdominal pain (right-sided flank pain) and nausea. Negative for blood in stool, constipation, diarrhea and vomiting.  Genitourinary:  Negative for hematuria.   Skin: Negative.   Neurological:  Positive for dizziness and numbness (tingling in left arm). Negative for headaches and light-headedness.  Hematological:  Does not bruise/bleed easily.  All other systems reviewed and are negative.    PAST MEDICAL/SURGICAL HISTORY:  Past Medical History:  Diagnosis Date   Anemia    Asthma    childhood   Carotidynia    COPD (chronic obstructive pulmonary disease) ([place])    DDD (degenerative disc disease), cervical    GERD (gastroesophageal reflux disease)    Hypertension    Knee pain    left meniscus tear s/p repair 11/2014   Seizures ([place])    last seizure was over 10 years ago, unknown etiology   Stuttering    patient reports this started after her knee surgery in 11/2014.    Thyroid nodule    Past Surgical History:  Procedure Laterality Date   ANTERIOR CERVICAL DECOMP/DISCECTOMY FUSION N/A 03/20/2019   Procedure: Anterior Cervical Decompression Fusion - Cervical Four-Cervical Five - Cervical Five-Cervical Six;  Surgeon: Eustace Moore, MD;  Location: [facility];  Service: Neurosurgery;  Laterality: N/A;  Anterior Cervical Decompression Fusion - Cervical Four-Cervical Five - Cervical Five-Cervical Six   BIOPSY  09/20/2020   Procedure: BIOPSY;  Surgeon: Daneil Dolin, MD;  [facility];  Service: Endoscopy;;   COLONOSCOPY N/A 08/23/2016   Procedure: COLONOSCOPY;  Surgeon: Daneil Dolin, MD;  [facility];  Service: Endoscopy;  Laterality: N/A;  1:00pm   COLONOSCOPY  N/A 08/24/2020   Diverticulosis in the entire examined colon. Redundant colon. Exam otherwise normal   ESOPHAGOGASTRODUODENOSCOPY N/A 05/03/2016   Procedure: ESOPHAGOGASTRODUODENOSCOPY (EGD);  Surgeon: Daneil Dolin, MD;  [facility];  Service: Endoscopy;  Laterality: N/A;   ESOPHAGOGASTRODUODENOSCOPY N/A 08/23/2016   Procedure: ESOPHAGOGASTRODUODENOSCOPY (EGD);  Surgeon: Daneil Dolin, MD;  [facility];  Service: Endoscopy;  Laterality: N/A;   ESOPHAGOGASTRODUODENOSCOPY (EGD) WITH PROPOFOL N/A 09/20/2020   normal esophagus. Large hiatal hernia. Multiple gastric erosions (Cameron lesions). Normal duodenal bulb and second portion of the duodenum. Suspicion Lysbeth Galas lesions causing intermittent bleeding   KNEE ARTHROSCOPY Right    KNEE ARTHROSCOPY WITH MEDIAL MENISECTOMY Left 12/09/2014   Procedure: KNEE ARTHROSCOPY WITH PARTIAL MEDIAL AND LATERAL MENISECTOMY;  Surgeon: Sanjuana Kava, MD;  Location: [facility];  Service: Orthopedics;  Laterality: Left;   TUBAL LIGATION     WISDOM TOOTH EXTRACTION       SOCIAL HISTORY:  Social History   Socioeconomic History   Marital status: Single    Spouse name: Not on file   Number of children: 1   Years of education: 12   Highest education level: Not on file  Occupational History   Occupation: Umemployed    Comment: Disability  Tobacco Use   Smoking status: Never   Smokeless tobacco: Never  Vaping Use   Vaping Use: Never used  Substance and Sexual Activity   Alcohol use: No   Drug use: No   Sexual activity: Not Currently    Birth control/protection: Surgical  Other Topics Concern   Not on file  Social History Narrative   Lives at home alone.   Disabled - speech difficulties, cannot drive   Right-handed.   Occasional caffeine use.   Her daughter lives about 30-45 minutes away   She has lots of family nearby - uncle, mother, sister, brother   Social Determinants of Radio broadcast assistant Strain: Low Risk     Difficulty of Paying Living Expenses: Not hard at all  Food Insecurity: No Food Insecurity   Worried About Charity fundraiser in the Last Year: Never true   Arboriculturist in the Last Year: Never true  Transportation Needs: No Transportation Needs   Lack of Transportation (Medical): No   Lack of Transportation (Non-Medical): No  Physical Activity: Insufficiently Active   Days of Exercise per Week: 2 days   Minutes of Exercise per Session: 30 min  Stress: No Stress Concern Present   Feeling of Stress : Not at all  Social Connections: Moderately Integrated   Frequency of Communication with Friends and Family: More than three times a week   Frequency of Social Gatherings with Friends and Family: More than three times a week   Attends Religious Services: More than 4 times per year   Active Member of [employer] or Organizations: Yes   Attends Music therapist: More than 4 times per year   Marital Status: Never married  Intimate Partner Violence: Not At Risk   Fear of Current or Ex-Partner:  No   Emotionally Abused: No   Physically Abused: No   Sexually Abused: No    FAMILY HISTORY:  Family History  Problem Relation Age of Onset   Hypertension Mother    Lung cancer Father    Hypertension Brother    Colon cancer Neg Hx    Gastric cancer Neg Hx    Esophageal cancer Neg Hx     CURRENT MEDICATIONS:  Outpatient Encounter Medications as of 12/23/2020  Medication Sig Note   amLODipine (NORVASC) 10 MG tablet TAKE 1 TABLET ONCE DAILY (Patient taking differently: Take 10 mg by mouth daily.)    amoxicillin-clavulanate (AUGMENTIN) 875-125 MG tablet Take 1 tablet by mouth 2 (two) times daily for 10 days.    busPIRone (BUSPAR) 5 MG tablet TAKE (1) TABLET UP TO THREE TIMES DAILY.    Cholecalciferol (D 2000) 50 MCG (2000 UT) TABS Take by mouth daily.    dicyclomine (BENTYL) 10 MG capsule Take 1 capsule (10 mg total) by mouth 4 (four) times daily -  before meals and at bedtime. For  cramping and loose stool. Stop if constipation.    docusate sodium (COLACE) 100 MG capsule Take 100 mg by mouth daily.    escitalopram (LEXAPRO) 20 MG tablet Take 1 tablet by mouth daily.    ferrous sulfate 325 (65 FE) MG tablet Take 1 tablet by mouth daily.    lisinopril (ZESTRIL) 20 MG tablet TAKE 1 TABLET DAILY (Patient taking differently: Take 20 mg by mouth daily.)    meclizine (ANTIVERT) 25 MG tablet Take 1 tablet (25 mg total) by mouth 3 (three) times daily as needed for dizziness.    methocarbamol (ROBAXIN) 500 MG tablet Take by mouth every 8 (eight) hours as needed.    pantoprazole (PROTONIX) 40 MG tablet TAKE 1 TABLET ONCE DAILY BEFORE BREAKFAST (Patient taking differently: Take 40 mg by mouth daily.)    potassium chloride SA (KLOR-CON) 20 MEQ tablet Take 1 tablet (20 mEq total) by mouth 2 (two) times daily.    Vitamin D, Ergocalciferol, (DRISDOL) 1.25 MG (50000 UNIT) CAPS capsule Take 50,000 Units by mouth once a week. 08/18/2020: Wednesdays    No facility-administered encounter medications on file as of 12/23/2020.    ALLERGIES:  No Known Allergies   PHYSICAL EXAM:  ECOG PERFORMANCE STATUS: 1 - Symptomatic but completely ambulatory  There were no vitals filed for this visit. There were no vitals filed for this visit. Physical Exam Constitutional:      Appearance: Normal appearance. She is obese.  HENT:     Head: Normocephalic and atraumatic.     Mouth/Throat:     Mouth: Mucous membranes are moist.  Eyes:     Extraocular Movements: Extraocular movements intact.     Pupils: Pupils are equal, round, and reactive to light.  Cardiovascular:     Rate and Rhythm: Normal rate and regular rhythm.     Pulses: Normal pulses.     Heart sounds: Normal heart sounds.  Pulmonary:     Effort: Pulmonary effort is normal.     Breath sounds: Normal breath sounds.  Abdominal:     General: Bowel sounds are normal.     Palpations: Abdomen is soft.     Tenderness: There is abdominal  tenderness (markedly tneder to palpation in right flank).  Musculoskeletal:        General: No swelling.     Right lower leg: No edema.     Left lower leg: No edema.  Lymphadenopathy:  Cervical: No cervical adenopathy.  Skin:    General: Skin is warm and dry.  Neurological:     General: No focal deficit present.     Mental Status: She is alert and oriented to person, place, and time.     Cranial Nerves: Dysarthria (chronic speech impediment) present.  Psychiatric:        Mood and Affect: Mood normal.        Behavior: Behavior normal.     LABORATORY DATA:  I have reviewed the labs as listed.  CBC    Component Value Date/Time   WBC 5.5 12/16/2020 1132   RBC 3.93 12/16/2020 1132   HGB 11.9 (L) 12/16/2020 1132   HGB 12.6 10/24/2020 1318   HCT 36.8 12/16/2020 1132   HCT 39.0 10/24/2020 1318   PLT 201 12/16/2020 1132   PLT 277 10/24/2020 1318   MCV 93.6 12/16/2020 1132   MCV 88 10/24/2020 1318   MCH 30.3 12/16/2020 1132   MCHC 32.3 12/16/2020 1132   RDW 13.2 12/16/2020 1132   RDW 14.8 10/24/2020 1318   LYMPHSABS 1.9 12/16/2020 1132   LYMPHSABS 2.3 10/24/2020 1318   MONOABS 0.3 12/16/2020 1132   EOSABS 0.1 12/16/2020 1132   EOSABS 0.2 10/24/2020 1318   BASOSABS 0.0 12/16/2020 1132   BASOSABS 0.0 10/24/2020 1318   CMP Latest Ref Rng & Units 12/16/2020 10/24/2020 10/05/2020  Glucose 70 - 99 mg/dL 144(H) 80 110(H)  BUN 6 - 20 mg/dL '14 11 20  '$ Creatinine 0.44 - 1.00 mg/dL 0.69 0.76 0.92  Sodium 135 - 145 mmol/L 138 141 138  Potassium 3.5 - 5.1 mmol/L 3.3(L) 4.2 3.7  Chloride 98 - 111 mmol/L 102 98 103  CO2 22 - 32 mmol/L '28 28 27  '$ Calcium 8.9 - 10.3 mg/dL 9.1 10.2 9.1  Total Protein 6.5 - 8.1 g/dL 7.5 8.3 7.4  Total Bilirubin 0.3 - 1.2 mg/dL 0.5 0.3 0.3  Alkaline Phos 38 - 126 U/L 82 102 92  AST 15 - 41 U/L 14(L) 13 14(L)  ALT 0 - 44 U/L '16 17 19    '$ DIAGNOSTIC IMAGING:  I have independently reviewed the relevant imaging and discussed with the patient.  ASSESSMENT &  PLAN: 1.  Iron deficiency anemia with suspected occult GI bleed - ED on 07/18/2020 with Hgb 5.7, fatigue, lightheadedness.  Denied overt GI bleeding and was Hemoccult-negative, but does have a history of peptic ulcer disease.  She was transfused 2 units PRBCs on 07/18/2020, and followed up with gastroenterology.  - Colonoscopy (08/24/2020) showed diverticulosis, but was otherwise normal. - EGD (09/20/2020): Large hiatal hernia, Cameron lesions and antral erosions (suspicious for causing intermittent bleeding in the setting of ongoing NSAID use per GI note) - She is on Protonix for suspected recurrent peptic ulcer disease - Hematology work-up (07/29/2020) showed Hgb 8.5 (microcytic with MCV 78.2).  Ferritin was 7, with 32% iron saturation.  Reticulocytes 3.4 (reticulocyte index 1.61, consistent with hypoproliferation in the setting of iron deficiency).   - She received IV Venofer x '800mg'$  in divided doses from 08/02/2020 through 08/11/2020.   - Repeat labs (09/13/2020) - Hgb improved to 11.2, normal MCV 86.8.  Iron saturation was low at 9%, with ferritin 23. - Additional IV iron (Venofer x1000 mg in 3 divided doses) from 09/26/2020 through 10/03/2020 - Denies any GI blood loss, but reports abnormal vaginal bleeding x5 days this month, is following with her GYN - Admits to fatigue, energy about 60% - Most recent labs (12/15/2020): Hgb 11.9,  MCV 93.6, ferritin 43, iron saturation 16%; copper, methylmalonic acid normal - PLAN: Due to persistent iron deficiency and fatigue, recommend IV Venofer 400 mg x 2 doses.  Continue GI and GYN follow-up.  RTC in 4 months for repeat labs and follow-up.   PLAN SUMMARY & DISPOSITION: -IV Feraheme 400 mg x 2 doses - Labs and RTC in 4 months  All questions were answered. The patient knows to call the clinic with any problems, questions or concerns.  Medical decision making: Low  Time spent on visit: I spent 15 minutes counseling the patient face to face. The total time spent in  the appointment was 25 minutes and more than 50% was on counseling.   Harriett Rush, PA-C  12/23/2020 10:58 AM

## 2020-12-23 ENCOUNTER — Inpatient Hospital Stay (HOSPITAL_BASED_OUTPATIENT_CLINIC_OR_DEPARTMENT_OTHER): Payer: Medicare Other | Admitting: Physician Assistant

## 2020-12-23 ENCOUNTER — Other Ambulatory Visit: Payer: Self-pay

## 2020-12-23 VITALS — BP 134/82 | HR 101 | Temp 98.5°F | Resp 16 | Wt 229.9 lb

## 2020-12-23 DIAGNOSIS — D500 Iron deficiency anemia secondary to blood loss (chronic): Secondary | ICD-10-CM

## 2020-12-23 DIAGNOSIS — D509 Iron deficiency anemia, unspecified: Secondary | ICD-10-CM | POA: Diagnosis not present

## 2020-12-23 NOTE — Patient Instructions (Signed)
[place] at [facility] Discharge Instructions  You were seen today by Tarri Abernethy PA-C for your iron deficiency anemia.  Your blood and iron levels have improved, but are not yet back to normal.  We recommend additional IV iron (Venofer 400 mg x 2 doses).  We will schedule you for this in the upcoming weeks.  We will see you for follow-up in 4 months.  LABS: Return in 4 months for repeat labs  MEDICATIONS: No changes to home medications  FOLLOW-UP APPOINTMENT: Office visit in 4 months   Thank you for choosing [place] [facility] to provide your oncology and hematology care.  To afford each patient quality time with our provider, please arrive at least 15 minutes before your scheduled appointment time.   If you have a lab appointment with the [facility] please come in thru the Main Entrance and check in at the main information desk.  You need to re-schedule your appointment should you arrive 10 or more minutes late.  We strive to give you quality time with our providers, and arriving late affects you and other patients whose appointments are after yours.  Also, if you no show three or more times for appointments you may be dismissed from the clinic at the providers discretion.     Again, thank you for choosing [facility].  Our hope is that these requests will decrease the amount of time that you wait before being seen by our physicians.       _____________________________________________________________  Should you have questions after your visit to [facility], please contact our office at [phone] and follow the prompts.  Our office hours are 8:00 a.m. and 4:30 p.m. Monday - Friday.  Please note that voicemails left after 4:00 p.m. may not be returned until the following business day.  We are closed weekends and major holidays.  You do have access to a nurse 24-7, just call the main number to the  clinic [phone] and do not press any options, hold on the line and a nurse will answer the phone.    For prescription refill requests, have your pharmacy contact our office and allow 72 hours.    Due to Covid, you will need to wear a mask upon entering the hospital. If you do not have a mask, a mask will be given to you at the Main Entrance upon arrival. For doctor visits, patients may have 1 support person age 77 or older with them. For treatment visits, patients can not have anyone with them due to social distancing guidelines and our immunocompromised population.

## 2020-12-26 ENCOUNTER — Other Ambulatory Visit: Payer: Self-pay

## 2020-12-26 ENCOUNTER — Inpatient Hospital Stay (HOSPITAL_COMMUNITY): Payer: Medicare Other

## 2020-12-26 ENCOUNTER — Encounter (HOSPITAL_COMMUNITY): Payer: Self-pay

## 2020-12-26 VITALS — BP 135/90 | HR 84 | Temp 96.9°F | Resp 18

## 2020-12-26 DIAGNOSIS — D62 Acute posthemorrhagic anemia: Secondary | ICD-10-CM

## 2020-12-26 DIAGNOSIS — D509 Iron deficiency anemia, unspecified: Secondary | ICD-10-CM | POA: Diagnosis not present

## 2020-12-26 DIAGNOSIS — D500 Iron deficiency anemia secondary to blood loss (chronic): Secondary | ICD-10-CM

## 2020-12-26 MED ORDER — LORATADINE 10 MG PO TABS
10.0000 mg | ORAL_TABLET | Freq: Once | ORAL | Status: AC
  Administered 2020-12-26: 10 mg via ORAL
  Filled 2020-12-26: qty 1

## 2020-12-26 MED ORDER — SODIUM CHLORIDE 0.9 % IV SOLN: Freq: Once | INTRAVENOUS | Status: AC

## 2020-12-26 MED ORDER — SODIUM CHLORIDE 0.9 % IV SOLN
400.0000 mg | Freq: Once | INTRAVENOUS | Status: AC
  Administered 2020-12-26: 400 mg via INTRAVENOUS
  Filled 2020-12-26: qty 20

## 2020-12-26 MED ORDER — ACETAMINOPHEN 325 MG PO TABS
650.0000 mg | ORAL_TABLET | Freq: Once | ORAL | Status: AC
  Administered 2020-12-26: 650 mg via ORAL
  Filled 2020-12-26: qty 2

## 2020-12-26 NOTE — Patient Instructions (Signed)
[facility]  Discharge Instructions: Thank you for choosing [facility] to provide your oncology and hematology care.  If you have a lab appointment with the Cancer Center, please come in thru the Main Entrance and check in at the main information desk.  Wear comfortable clothing and clothing appropriate for easy access to any Portacath or PICC line.   We strive to give you quality time with your provider. You may need to reschedule your appointment if you arrive late (15 or more minutes).  Arriving late affects you and other patients whose appointments are after yours.  Also, if you miss three or more appointments without notifying the office, you may be dismissed from the clinic at the provider's discretion.      For prescription refill requests, have your pharmacy contact our office and allow 72 hours for refills to be completed.    Today you received Venofer 400mg IV iron infusion.    BELOW ARE SYMPTOMS THAT SHOULD BE REPORTED IMMEDIATELY: *FEVER GREATER THAN 100.4 F (38 C) OR HIGHER *CHILLS OR SWEATING *NAUSEA AND VOMITING THAT IS NOT CONTROLLED WITH YOUR NAUSEA MEDICATION *UNUSUAL SHORTNESS OF BREATH *UNUSUAL BRUISING OR BLEEDING *URINARY PROBLEMS (pain or burning when urinating, or frequent urination) *BOWEL PROBLEMS (unusual diarrhea, constipation, pain near the anus) TENDERNESS IN MOUTH AND THROAT WITH OR WITHOUT PRESENCE OF ULCERS (sore throat, sores in mouth, or a toothache) UNUSUAL RASH, SWELLING OR PAIN  UNUSUAL VAGINAL DISCHARGE OR ITCHING   Items with * indicate a potential emergency and should be followed up as soon as possible or go to the Emergency Department if any problems should occur.  Please show the CHEMOTHERAPY ALERT CARD or IMMUNOTHERAPY ALERT CARD at check-in to the Emergency Department and triage nurse.  Should you have questions after your visit or need to cancel or reschedule your appointment, please contact [facility] [phone]  and follow the prompts.  Office hours are 8:00 a.m. to 4:30 p.m. Monday - Friday. Please note that voicemails left after 4:00 p.m. may not be returned until the following business day.  We are closed weekends and major holidays. You have access to a nurse at all times for urgent questions. Please call the main number to the clinic [phone] and follow the prompts.  For any non-urgent questions, you may also contact your provider using MyChart. We now offer e-Visits for anyone 18 and older to request care online for non-urgent symptoms. For details visit [email or web address].   Also download the MyChart app! Go to the app store, search "MyChart", open the app, select [facility], and log in with your MyChart username and password.  Due to Covid, a mask is required upon entering the hospital/clinic. If you do not have a mask, one will be given to you upon arrival. For doctor visits, patients may have 1 support person aged 18 or older with them. For treatment visits, patients cannot have anyone with them due to current Covid guidelines and our immunocompromised population.  

## 2020-12-26 NOTE — Progress Notes (Signed)
Pt presents today for Venofer 400 mg per provider's order. Vital signs stable and pt voiced no new complaints at this time.   Peripheral IV started with good blood return pre and post infusion.  Venofer given today per MD orders. Tolerated infusion without adverse affects. Vital signs stable. No complaints at this time. Discharged from clinic ambulatory in stable condition. Alert and oriented x 3. F/U with [facility] as scheduled.

## 2020-12-27 ENCOUNTER — Ambulatory Visit (HOSPITAL_COMMUNITY): Payer: Medicare Other

## 2020-12-27 DIAGNOSIS — R1011 Right upper quadrant pain: Secondary | ICD-10-CM | POA: Diagnosis not present

## 2020-12-27 DIAGNOSIS — R109 Unspecified abdominal pain: Secondary | ICD-10-CM | POA: Diagnosis not present

## 2021-01-02 ENCOUNTER — Inpatient Hospital Stay (HOSPITAL_COMMUNITY): Payer: Medicare Other

## 2021-01-02 ENCOUNTER — Other Ambulatory Visit: Payer: Self-pay

## 2021-01-02 ENCOUNTER — Encounter (HOSPITAL_COMMUNITY): Payer: Self-pay

## 2021-01-02 VITALS — BP 130/81 | HR 95 | Temp 98.5°F | Resp 18

## 2021-01-02 DIAGNOSIS — D509 Iron deficiency anemia, unspecified: Secondary | ICD-10-CM | POA: Diagnosis not present

## 2021-01-02 DIAGNOSIS — D500 Iron deficiency anemia secondary to blood loss (chronic): Secondary | ICD-10-CM

## 2021-01-02 DIAGNOSIS — D62 Acute posthemorrhagic anemia: Secondary | ICD-10-CM

## 2021-01-02 MED ORDER — SODIUM CHLORIDE 0.9 % IV SOLN
400.0000 mg | Freq: Once | INTRAVENOUS | Status: AC
  Administered 2021-01-02: 400 mg via INTRAVENOUS
  Filled 2021-01-02: qty 20

## 2021-01-02 MED ORDER — LORATADINE 10 MG PO TABS
10.0000 mg | ORAL_TABLET | Freq: Once | ORAL | Status: AC
  Administered 2021-01-02: 10 mg via ORAL
  Filled 2021-01-02: qty 1

## 2021-01-02 MED ORDER — ACETAMINOPHEN 325 MG PO TABS
650.0000 mg | ORAL_TABLET | Freq: Once | ORAL | Status: AC
  Administered 2021-01-02: 650 mg via ORAL
  Filled 2021-01-02: qty 2

## 2021-01-02 MED ORDER — SODIUM CHLORIDE 0.9 % IV SOLN: Freq: Once | INTRAVENOUS | Status: AC

## 2021-01-02 NOTE — Progress Notes (Signed)
Patient tolerated iron infusion with no complaints voiced.  Peripheral IV site clean and dry with good blood return noted before and after infusion.  Band aid applied.  VSS with discharge and left in satisfactory condition with no s/s of distress noted.   

## 2021-01-02 NOTE — Patient Instructions (Signed)
[facility]  Discharge Instructions: Thank you for choosing [facility] to provide your oncology and hematology care.  If you have a lab appointment with the Cancer Center, please come in thru the Main Entrance and check in at the main information desk.  Wear comfortable clothing and clothing appropriate for easy access to any Portacath or PICC line.   We strive to give you quality time with your provider. You may need to reschedule your appointment if you arrive late (15 or more minutes).  Arriving late affects you and other patients whose appointments are after yours.  Also, if you miss three or more appointments without notifying the office, you may be dismissed from the clinic at the provider's discretion.      For prescription refill requests, have your pharmacy contact our office and allow 72 hours for refills to be completed.    Today you received the following: Venofer, return as scheduled.   To help prevent nausea and vomiting after your treatment, we encourage you to take your nausea medication as directed.  BELOW ARE SYMPTOMS THAT SHOULD BE REPORTED IMMEDIATELY: *FEVER GREATER THAN 100.4 F (38 C) OR HIGHER *CHILLS OR SWEATING *NAUSEA AND VOMITING THAT IS NOT CONTROLLED WITH YOUR NAUSEA MEDICATION *UNUSUAL SHORTNESS OF BREATH *UNUSUAL BRUISING OR BLEEDING *URINARY PROBLEMS (pain or burning when urinating, or frequent urination) *BOWEL PROBLEMS (unusual diarrhea, constipation, pain near the anus) TENDERNESS IN MOUTH AND THROAT WITH OR WITHOUT PRESENCE OF ULCERS (sore throat, sores in mouth, or a toothache) UNUSUAL RASH, SWELLING OR PAIN  UNUSUAL VAGINAL DISCHARGE OR ITCHING   Items with * indicate a potential emergency and should be followed up as soon as possible or go to the Emergency Department if any problems should occur.  Please show the CHEMOTHERAPY ALERT CARD or IMMUNOTHERAPY ALERT CARD at check-in to the Emergency Department and triage  nurse.  Should you have questions after your visit or need to cancel or reschedule your appointment, please contact [facility] [phone]  and follow the prompts.  Office hours are 8:00 a.m. to 4:30 p.m. Monday - Friday. Please note that voicemails left after 4:00 p.m. may not be returned until the following business day.  We are closed weekends and major holidays. You have access to a nurse at all times for urgent questions. Please call the main number to the clinic [phone] and follow the prompts.  For any non-urgent questions, you may also contact your provider using MyChart. We now offer e-Visits for anyone 18 and older to request care online for non-urgent symptoms. For details visit [email or web address].   Also download the MyChart app! Go to the app store, search "MyChart", open the app, select [facility], and log in with your MyChart username and password.  Due to Covid, a mask is required upon entering the hospital/clinic. If you do not have a mask, one will be given to you upon arrival. For doctor visits, patients may have 1 support person aged 18 or older with them. For treatment visits, patients cannot have anyone with them due to current Covid guidelines and our immunocompromised population.  

## 2021-01-03 ENCOUNTER — Ambulatory Visit (HOSPITAL_COMMUNITY): Payer: Medicare Other

## 2021-01-03 ENCOUNTER — Other Ambulatory Visit: Payer: Self-pay | Admitting: Family

## 2021-01-03 DIAGNOSIS — I10 Essential (primary) hypertension: Secondary | ICD-10-CM

## 2021-01-03 DIAGNOSIS — K279 Peptic ulcer, site unspecified, unspecified as acute or chronic, without hemorrhage or perforation: Secondary | ICD-10-CM

## 2021-01-09 DIAGNOSIS — K5732 Diverticulitis of large intestine without perforation or abscess without bleeding: Secondary | ICD-10-CM | POA: Diagnosis not present

## 2021-01-09 DIAGNOSIS — R109 Unspecified abdominal pain: Secondary | ICD-10-CM | POA: Diagnosis not present

## 2021-01-25 DIAGNOSIS — R109 Unspecified abdominal pain: Secondary | ICD-10-CM | POA: Diagnosis not present

## 2021-01-25 DIAGNOSIS — R1011 Right upper quadrant pain: Secondary | ICD-10-CM | POA: Diagnosis not present

## 2021-02-01 DIAGNOSIS — R1011 Right upper quadrant pain: Secondary | ICD-10-CM | POA: Diagnosis not present

## 2021-02-01 DIAGNOSIS — K828 Other specified diseases of gallbladder: Secondary | ICD-10-CM | POA: Diagnosis not present

## 2021-02-09 ENCOUNTER — Encounter: Payer: Self-pay | Admitting: Family

## 2021-02-09 ENCOUNTER — Ambulatory Visit (INDEPENDENT_AMBULATORY_CARE_PROVIDER_SITE_OTHER): Payer: Medicare Other | Admitting: Family

## 2021-02-09 ENCOUNTER — Other Ambulatory Visit: Payer: Self-pay

## 2021-02-09 VITALS — BP 119/83 | HR 112 | Temp 97.7°F | Ht 67.0 in | Wt 231.2 lb

## 2021-02-09 DIAGNOSIS — D500 Iron deficiency anemia secondary to blood loss (chronic): Secondary | ICD-10-CM | POA: Diagnosis not present

## 2021-02-09 DIAGNOSIS — M159 Polyosteoarthritis, unspecified: Secondary | ICD-10-CM

## 2021-02-09 DIAGNOSIS — K219 Gastro-esophageal reflux disease without esophagitis: Secondary | ICD-10-CM | POA: Diagnosis not present

## 2021-02-09 DIAGNOSIS — E559 Vitamin D deficiency, unspecified: Secondary | ICD-10-CM | POA: Diagnosis not present

## 2021-02-09 DIAGNOSIS — F411 Generalized anxiety disorder: Secondary | ICD-10-CM

## 2021-02-09 DIAGNOSIS — F809 Developmental disorder of speech and language, unspecified: Secondary | ICD-10-CM | POA: Diagnosis not present

## 2021-02-09 DIAGNOSIS — I10 Essential (primary) hypertension: Secondary | ICD-10-CM | POA: Diagnosis not present

## 2021-02-09 DIAGNOSIS — F321 Major depressive disorder, single episode, moderate: Secondary | ICD-10-CM | POA: Diagnosis not present

## 2021-02-09 DIAGNOSIS — M8949 Other hypertrophic osteoarthropathy, multiple sites: Secondary | ICD-10-CM

## 2021-02-09 DIAGNOSIS — D649 Anemia, unspecified: Secondary | ICD-10-CM | POA: Diagnosis not present

## 2021-02-09 DIAGNOSIS — G4700 Insomnia, unspecified: Secondary | ICD-10-CM | POA: Diagnosis not present

## 2021-02-09 NOTE — Progress Notes (Signed)
Subjective:    Patient ID: Brittany Durham, female    DOB: 05-27-1970, 50 y.o.   MRN: [id number]  Chief Complaint  Patient presents with   Medical Management of Chronic Issues   PT presents to the office today for chronic follow up. Pt is followed by Neurologists for Speech difficulties. She is followed by GI every 6 months for GERD and diverticulitis and right flank pain. She is scheduled for Cholecystectomy on 02/23/21. Hypertension This is a chronic problem. The current episode started more than 1 year ago. The problem has been resolved since onset. The problem is controlled. Associated symptoms include anxiety. Pertinent negatives include no malaise/fatigue, peripheral edema or shortness of breath. Risk factors for coronary artery disease include dyslipidemia and obesity. The current treatment provides moderate improvement.  Gastroesophageal Reflux She complains of belching and heartburn. This is a chronic problem. The current episode started more than 1 year ago. The problem occurs occasionally. The problem has been waxing and waning. She has tried a PPI for the symptoms. The treatment provided moderate relief.  Arthritis Presents for follow-up visit. She complains of pain and stiffness. The symptoms have been stable. Affected locations include the left knee and right knee. Her pain is at a severity of 1/10.  Anemia Presents for follow-up visit. There has been no bruising/bleeding easily or malaise/fatigue.  Anxiety Presents for follow-up visit. Symptoms include depressed mood, excessive worry, irritability, nervous/anxious behavior and restlessness. Patient reports no shortness of breath.   Her past medical history is significant for anemia.  Depression        This is a chronic problem.  The current episode started more than 1 year ago.   The onset quality is gradual.   The problem occurs intermittently.  Associated symptoms include helplessness, hopelessness, irritable and restlessness.   Associated symptoms include not sad.  Past medical history includes anxiety.      Review of Systems  Constitutional:  Positive for irritability. Negative for malaise/fatigue.  Respiratory:  Negative for shortness of breath.   Gastrointestinal:  Positive for heartburn.  Musculoskeletal:  Positive for arthritis and stiffness.  Hematological:  Does not bruise/bleed easily.  Psychiatric/Behavioral:  Positive for depression. The patient is nervous/anxious.   All other systems reviewed and are negative.     Objective:   Physical Exam Vitals reviewed.  Constitutional:      General: She is irritable. She is not in acute distress.    Appearance: She is well-developed. She is obese.  HENT:     Head: Normocephalic and atraumatic.     Right Ear: Tympanic membrane normal.     Left Ear: Tympanic membrane normal.  Eyes:     Pupils: Pupils are equal, round, and reactive to light.  Neck:     Thyroid: No thyromegaly.  Cardiovascular:     Rate and Rhythm: Normal rate and regular rhythm.     Heart sounds: Normal heart sounds. No murmur heard. Pulmonary:     Effort: Pulmonary effort is normal. No respiratory distress.     Breath sounds: Normal breath sounds. No wheezing.  Abdominal:     General: Bowel sounds are normal. There is no distension.     Palpations: Abdomen is soft.     Tenderness: There is abdominal tenderness. There is right CVA tenderness.  Musculoskeletal:        General: No tenderness. Normal range of motion.     Cervical back: Normal range of motion and neck supple.  Skin:    General:  Skin is warm and dry.  Neurological:     Mental Status: She is alert and oriented to person, place, and time.     Cranial Nerves: No cranial nerve deficit.     Deep Tendon Reflexes: Reflexes are normal and symmetric.  Psychiatric:        Speech: Slurred: stuttering.        Behavior: Behavior normal.        Thought Content: Thought content normal.        Judgment: Judgment normal.       BP 119/83   Pulse (!) 112   Temp 97.7 F (36.5 C) (Temporal)   Ht 5\' 7"  (1.702 m)   Wt 231 lb 3.2 oz (104.9 kg)   LMP 02/18/2019   BMI 36.21 kg/m      Assessment & Plan:  Brittany Durham comes in today with chief complaint of Medical Management of Chronic Issues   Diagnosis and orders addressed:  1. HYPERTENSION, BENIGN  2. Gastroesophageal reflux disease without esophagitis  3. Primary osteoarthritis involving multiple joints  4. Language difficulty  5. Insomnia, unspecified type  6. GAD (generalized anxiety disorder)  7. Depression, major, single episode, moderate ([place])  8. Iron deficiency anemia due to chronic blood loss  9. Vitamin D deficiency  10. Symptomatic anemia   Labs reviewed from hospital. Health Maintenance reviewed Diet and exercise encouraged  Follow up plan: 6 months    Evelina Dun, FNP

## 2021-02-09 NOTE — Patient Instructions (Signed)
Health Maintenance, Female Adopting a healthy lifestyle and getting preventive care are important in promoting health and wellness. Ask your health care provider about: The right schedule for you to have regular tests and exams. Things you can do on your own to prevent diseases and keep yourself healthy. What should I know about diet, weight, and exercise? Eat a healthy diet  Eat a diet that includes plenty of vegetables, fruits, low-fat dairy products, and lean protein. Do not eat a lot of foods that are high in solid fats, added sugars, or sodium. Maintain a healthy weight Body mass index (BMI) is used to identify weight problems. It estimates body fat based on height and weight. Your health care provider can help determine your BMI and help you achieve or maintain a healthy weight. Get regular exercise Get regular exercise. This is one of the most important things you can do for your health. Most adults should: Exercise for at least 150 minutes each week. The exercise should increase your heart rate and make you sweat (moderate-intensity exercise). Do strengthening exercises at least twice a week. This is in addition to the moderate-intensity exercise. Spend less time sitting. Even light physical activity can be beneficial. Watch cholesterol and blood lipids Have your blood tested for lipids and cholesterol at 52 years of age, then have this test every 5 years. Have your cholesterol levels checked more often if: Your lipid or cholesterol levels are high. You are older than 52 years of age. You are at high risk for heart disease. What should I know about cancer screening? Depending on your health history and family history, you may need to have cancer screening at various ages. This may include screening for: Breast cancer. Cervical cancer. Colorectal cancer. Skin cancer. Lung cancer. What should I know about heart disease, diabetes, and high blood pressure? Blood pressure and heart  disease High blood pressure causes heart disease and increases the risk of stroke. This is more likely to develop in people who have high blood pressure readings, are of African descent, or are overweight. Have your blood pressure checked: Every 3-5 years if you are 18-39 years of age. Every year if you are 40 years old or older. Diabetes Have regular diabetes screenings. This checks your fasting blood sugar level. Have the screening done: Once every three years after age 40 if you are at a normal weight and have a low risk for diabetes. More often and at a younger age if you are overweight or have a high risk for diabetes. What should I know about preventing infection? Hepatitis B If you have a higher risk for hepatitis B, you should be screened for this virus. Talk with your health care provider to find out if you are at risk for hepatitis B infection. Hepatitis C Testing is recommended for: Everyone born from 1945 through 1965. Anyone with known risk factors for hepatitis C. Sexually transmitted infections (STIs) Get screened for STIs, including gonorrhea and chlamydia, if: You are sexually active and are younger than 52 years of age. You are older than 52 years of age and your health care provider tells you that you are at risk for this type of infection. Your sexual activity has changed since you were last screened, and you are at increased risk for chlamydia or gonorrhea. Ask your health care provider if you are at risk. Ask your health care provider about whether you are at high risk for HIV. Your health care provider may recommend a prescription medicine   to help prevent HIV infection. If you choose to take medicine to prevent HIV, you should first get tested for HIV. You should then be tested every 3 months for as long as you are taking the medicine. Pregnancy If you are about to stop having your period (premenopausal) and you may become pregnant, seek counseling before you get  pregnant. Take 400 to 800 micrograms (mcg) of folic acid every day if you become pregnant. Ask for birth control (contraception) if you want to prevent pregnancy. Osteoporosis and menopause Osteoporosis is a disease in which the bones lose minerals and strength with aging. This can result in bone fractures. If you are 65 years old or older, or if you are at risk for osteoporosis and fractures, ask your health care provider if you should: Be screened for bone loss. Take a calcium or vitamin D supplement to lower your risk of fractures. Be given hormone replacement therapy (HRT) to treat symptoms of menopause. Follow these instructions at home: Lifestyle Do not use any products that contain nicotine or tobacco, such as cigarettes, e-cigarettes, and chewing tobacco. If you need help quitting, ask your health care provider. Do not use street drugs. Do not share needles. Ask your health care provider for help if you need support or information about quitting drugs. Alcohol use Do not drink alcohol if: Your health care provider tells you not to drink. You are pregnant, may be pregnant, or are planning to become pregnant. If you drink alcohol: Limit how much you use to 0-1 drink a day. Limit intake if you are breastfeeding. Be aware of how much alcohol is in your drink. In the [place] one drink equals one 12 oz bottle of beer (355 mL), one 5 oz glass of wine (148 mL), or one 1 oz glass of hard liquor (44 mL). General instructions Schedule regular health, dental, and eye exams. Stay current with your vaccines. Tell your health care provider if: You often feel depressed. You have ever been abused or do not feel safe at home. Summary Adopting a healthy lifestyle and getting preventive care are important in promoting health and wellness. Follow your health care provider's instructions about healthy diet, exercising, and getting tested or screened for diseases. Follow your health care provider's  instructions on monitoring your cholesterol and blood pressure. This information is not intended to replace advice given to you by your health care provider. Make sure you discuss any questions you have with your health care provider. Document Revised: 07/08/2020 Document Reviewed: 04/23/2018 Elsevier Patient Education  2022 Elsevier Inc.  

## 2021-02-14 ENCOUNTER — Other Ambulatory Visit: Payer: Self-pay | Admitting: Family

## 2021-02-23 DIAGNOSIS — R000 Tachycardia, unspecified: Secondary | ICD-10-CM | POA: Diagnosis not present

## 2021-02-23 DIAGNOSIS — R110 Nausea: Secondary | ICD-10-CM | POA: Diagnosis not present

## 2021-02-23 DIAGNOSIS — K811 Chronic cholecystitis: Secondary | ICD-10-CM | POA: Diagnosis not present

## 2021-02-23 DIAGNOSIS — R599 Enlarged lymph nodes, unspecified: Secondary | ICD-10-CM | POA: Diagnosis not present

## 2021-02-23 DIAGNOSIS — K828 Other specified diseases of gallbladder: Secondary | ICD-10-CM | POA: Diagnosis not present

## 2021-02-23 DIAGNOSIS — R1011 Right upper quadrant pain: Secondary | ICD-10-CM | POA: Diagnosis not present

## 2021-03-01 ENCOUNTER — Ambulatory Visit: Payer: Medicare Other | Admitting: Gastroenterology

## 2021-03-14 DIAGNOSIS — R197 Diarrhea, unspecified: Secondary | ICD-10-CM | POA: Diagnosis not present

## 2021-03-14 DIAGNOSIS — Z9049 Acquired absence of other specified parts of digestive tract: Secondary | ICD-10-CM | POA: Diagnosis not present

## 2021-03-20 ENCOUNTER — Other Ambulatory Visit: Payer: Self-pay | Admitting: Nurse Practitioner

## 2021-03-20 ENCOUNTER — Other Ambulatory Visit: Payer: Self-pay

## 2021-03-20 ENCOUNTER — Other Ambulatory Visit: Payer: Self-pay | Admitting: Family

## 2021-03-20 ENCOUNTER — Ambulatory Visit
Admission: RE | Admit: 2021-03-20 | Discharge: 2021-03-20 | Disposition: A | Discharge status: Home / Self Care | Payer: Medicare Other | Source: Ambulatory Visit | Attending: Family | Admitting: Family

## 2021-03-20 DIAGNOSIS — Z1231 Encounter for screening mammogram for malignant neoplasm of breast: Secondary | ICD-10-CM

## 2021-03-20 DIAGNOSIS — K279 Peptic ulcer, site unspecified, unspecified as acute or chronic, without hemorrhage or perforation: Secondary | ICD-10-CM

## 2021-03-21 DIAGNOSIS — Z20828 Contact with and (suspected) exposure to other viral communicable diseases: Secondary | ICD-10-CM | POA: Diagnosis not present

## 2021-04-18 ENCOUNTER — Other Ambulatory Visit: Payer: Self-pay | Admitting: Family

## 2021-04-18 DIAGNOSIS — I10 Essential (primary) hypertension: Secondary | ICD-10-CM

## 2021-04-21 ENCOUNTER — Inpatient Hospital Stay (HOSPITAL_COMMUNITY): Payer: Medicare Other | Attending: Hematology

## 2021-04-21 ENCOUNTER — Other Ambulatory Visit: Payer: Self-pay

## 2021-04-21 DIAGNOSIS — D500 Iron deficiency anemia secondary to blood loss (chronic): Secondary | ICD-10-CM

## 2021-04-21 DIAGNOSIS — D509 Iron deficiency anemia, unspecified: Secondary | ICD-10-CM | POA: Diagnosis not present

## 2021-04-21 LAB — CBC WITH DIFFERENTIAL/PLATELET
Abs Immature Granulocytes: 0.02 10*3/uL (ref 0.00–0.07)
Basophils Absolute: 0 10*3/uL (ref 0.0–0.1)
Basophils Relative: 0 %
Eosinophils Absolute: 0.1 10*3/uL (ref 0.0–0.5)
Eosinophils Relative: 2 %
HCT: 36.1 % (ref 36.0–46.0)
Hemoglobin: 11.7 g/dL — ABNORMAL LOW (ref 12.0–15.0)
Immature Granulocytes: 0 %
Lymphocytes Relative: 31 %
Lymphs Abs: 2 10*3/uL (ref 0.7–4.0)
MCH: 29.4 pg (ref 26.0–34.0)
MCHC: 32.4 g/dL (ref 30.0–36.0)
MCV: 90.7 fL (ref 80.0–100.0)
Monocytes Absolute: 0.4 10*3/uL (ref 0.1–1.0)
Monocytes Relative: 7 %
Neutro Abs: 3.7 10*3/uL (ref 1.7–7.7)
Neutrophils Relative %: 60 %
Platelets: 220 10*3/uL (ref 150–400)
RBC: 3.98 MIL/uL (ref 3.87–5.11)
RDW: 12.8 % (ref 11.5–15.5)
WBC: 6.3 10*3/uL (ref 4.0–10.5)
nRBC: 0 % (ref 0.0–0.2)

## 2021-04-21 LAB — IRON AND TIBC
Iron: 43 ug/dL (ref 28–170)
Saturation Ratios: 10 % — ABNORMAL LOW (ref 10.4–31.8)
TIBC: 422 ug/dL (ref 250–450)
UIBC: 379 ug/dL

## 2021-04-21 LAB — FERRITIN: Ferritin: 22 ng/mL (ref 11–307)

## 2021-04-27 NOTE — Progress Notes (Signed)
Brittany Durham, [place] [address]   CLINIC:  Medical Oncology/Hematology  PCP:  Sharion Balloon, [address] [place] [address] [phone]   REASON FOR VISIT:  Follow-up for iron deficiency anemia  CURRENT THERAPY: Intermittent IV iron (last Venofer 01/02/2021)  INTERVAL HISTORY:  [name] 50 y.o. female returns for routine follow-up of her iron deficiency anemia.  She was last seen by Tarri Abernethy PA-C on 12/23/2020 and received IV Venofer 400 mg x 2 doses on 12/26/2020 and 01/02/2021.  At today's visit, she reports feeling fairly well.  She had cholecystectomy on 02/23/2021, and reports that this has resolved her previous issues with abdominal pain.  She denies any major bleeding events such as hematemesis, hematochezia, melena, or epistaxis.  She reports that her stool tends to be dark due to the iron supplementation.  At her last visit, she had noted some vaginal bleeding despite having not had a period since 2017.  This has resolved, and she has been advised to follow-up with her GYN.  She does feel fatigued, with energy about 40%.  She attributes this in part to arthritic pain in her knees.  She has some mild shortness of breath with exertion.  She denies any pica, restless legs, headaches, chest pain, lightheadedness, syncope.  She has 40% energy and 75% appetite. She endorses that she is maintaining a stable weight.    REVIEW OF SYSTEMS:  Review of Systems  Constitutional:  Positive for fatigue. Negative for appetite change, chills, diaphoresis, fever and unexpected weight change.  HENT:   Negative for lump/mass and nosebleeds.   Eyes:  Negative for eye problems.  Respiratory:  Positive for shortness of breath (with exertion). Negative for cough and hemoptysis.   Cardiovascular:  Negative for chest pain, leg swelling and palpitations.  Gastrointestinal:  Negative for abdominal pain, blood in stool, constipation,  diarrhea, nausea and vomiting.  Genitourinary:  Negative for hematuria.   Musculoskeletal:  Positive for arthralgias (knees).  Skin: Negative.   Neurological:  Negative for dizziness, headaches and light-headedness.  Hematological:  Does not bruise/bleed easily.     PAST MEDICAL/SURGICAL HISTORY:  Past Medical History:  Diagnosis Date   Anemia    Asthma    childhood   Carotidynia    COPD (chronic obstructive pulmonary disease) ([place])    DDD (degenerative disc disease), cervical    GERD (gastroesophageal reflux disease)    Hypertension    Knee pain    left meniscus tear s/p repair 11/2014   Seizures ([place])    last seizure was over 10 years ago, unknown etiology   Stuttering    patient reports this started after her knee surgery in 11/2014.    Thyroid nodule    Past Surgical History:  Procedure Laterality Date   ANTERIOR CERVICAL DECOMP/DISCECTOMY FUSION N/A 03/20/2019   Procedure: Anterior Cervical Decompression Fusion - Cervical Four-Cervical Five - Cervical Five-Cervical Six;  Surgeon: Eustace Moore, MD;  Location: [facility];  Service: Neurosurgery;  Laterality: N/A;  Anterior Cervical Decompression Fusion - Cervical Four-Cervical Five - Cervical Five-Cervical Six   BIOPSY  09/20/2020   Procedure: BIOPSY;  Surgeon: Daneil Dolin, MD;  [facility];  Service: Endoscopy;;   COLONOSCOPY N/A 08/23/2016   Procedure: COLONOSCOPY;  Surgeon: Daneil Dolin, MD;  [facility];  Service: Endoscopy;  Laterality: N/A;  1:00pm   COLONOSCOPY N/A 08/24/2020   Diverticulosis in the entire examined colon. Redundant colon.  Exam otherwise normal   ESOPHAGOGASTRODUODENOSCOPY N/A 05/03/2016   Procedure: ESOPHAGOGASTRODUODENOSCOPY (EGD);  Surgeon: Daneil Dolin, MD;  [facility];  Service: Endoscopy;  Laterality: N/A;   ESOPHAGOGASTRODUODENOSCOPY N/A 08/23/2016   Procedure: ESOPHAGOGASTRODUODENOSCOPY (EGD);  Surgeon: Daneil Dolin, MD;  [facility];   Service: Endoscopy;  Laterality: N/A;   ESOPHAGOGASTRODUODENOSCOPY (EGD) WITH PROPOFOL N/A 09/20/2020   normal esophagus. Large hiatal hernia. Multiple gastric erosions (Cameron lesions). Normal duodenal bulb and second portion of the duodenum. Suspicion Lysbeth Galas lesions causing intermittent bleeding   KNEE ARTHROSCOPY Right    KNEE ARTHROSCOPY WITH MEDIAL MENISECTOMY Left 12/09/2014   Procedure: KNEE ARTHROSCOPY WITH PARTIAL MEDIAL AND LATERAL MENISECTOMY;  Surgeon: Sanjuana Kava, MD;  Location: [facility];  Service: Orthopedics;  Laterality: Left;   TUBAL LIGATION     WISDOM TOOTH EXTRACTION       SOCIAL HISTORY:  Social History   Socioeconomic History   Marital status: Single    Spouse name: Not on file   Number of children: 1   Years of education: 12   Highest education level: Not on file  Occupational History   Occupation: Umemployed    Comment: Disability  Tobacco Use   Smoking status: Never   Smokeless tobacco: Never  Vaping Use   Vaping Use: Never used  Substance and Sexual Activity   Alcohol use: No   Drug use: No   Sexual activity: Not Currently    Birth control/protection: Surgical  Other Topics Concern   Not on file  Social History Narrative   Lives at home alone.   Disabled - speech difficulties, cannot drive   Right-handed.   Occasional caffeine use.   Her daughter lives about 30-45 minutes away   She has lots of family nearby - uncle, mother, sister, brother   Social Determinants of Radio broadcast assistant Strain: Low Risk    Difficulty of Paying Living Expenses: Not hard at all  Food Insecurity: No Food Insecurity   Worried About Charity fundraiser in the Last Year: Never true   Arboriculturist in the Last Year: Never true  Transportation Needs: No Transportation Needs   Lack of Transportation (Medical): No   Lack of Transportation (Non-Medical): No  Physical Activity: Insufficiently Active   Days of Exercise per Week: 2 days   Minutes of  Exercise per Session: 30 min  Stress: No Stress Concern Present   Feeling of Stress : Not at all  Social Connections: Moderately Integrated   Frequency of Communication with Friends and Family: More than three times a week   Frequency of Social Gatherings with Friends and Family: More than three times a week   Attends Religious Services: More than 4 times per year   Active Member of [employer] or Organizations: Yes   Attends Music therapist: More than 4 times per year   Marital Status: Never married  Human resources officer Violence: Not At Risk   Fear of Current or Ex-Partner: No   Emotionally Abused: No   Physically Abused: No   Sexually Abused: No    FAMILY HISTORY:  Family History  Problem Relation Age of Onset   Hypertension Mother    Lung cancer Father    Hypertension Brother    Colon cancer Neg Hx    Gastric cancer Neg Hx    Esophageal cancer Neg Hx     CURRENT MEDICATIONS:  Outpatient Encounter Medications as of 04/28/2021  Medication Sig Note  amLODipine (NORVASC) 10 MG tablet TAKE 1 TABLET ONCE DAILY    busPIRone (BUSPAR) 5 MG tablet TAKE (1) TABLET UP TO THREE TIMES DAILY.    Cholecalciferol (D 2000) 50 MCG (2000 UT) TABS Take by mouth daily.    dicyclomine (BENTYL) 10 MG capsule Take 1 capsule (10 mg total) by mouth 4 (four) times daily -  before meals and at bedtime. For cramping and loose stool. Stop if constipation.    docusate sodium (COLACE) 100 MG capsule Take 100 mg by mouth daily.    escitalopram (LEXAPRO) 20 MG tablet TAKE 1 TABLET DAILY.    ferrous sulfate 325 (65 FE) MG tablet Take 1 tablet by mouth daily.    lisinopril (ZESTRIL) 20 MG tablet TAKE 1 TABLET DAILY    meclizine (ANTIVERT) 25 MG tablet Take 1 tablet (25 mg total) by mouth 3 (three) times daily as needed for dizziness.    methocarbamol (ROBAXIN) 500 MG tablet Take by mouth every 8 (eight) hours as needed.    pantoprazole (PROTONIX) 40 MG tablet TAKE 1 TABLET ONCE DAILY BEFORE BREAKFAST     potassium chloride SA (KLOR-CON) 20 MEQ tablet Take 1 tablet (20 mEq total) by mouth 2 (two) times daily.    Vitamin D, Ergocalciferol, (DRISDOL) 1.25 MG (50000 UNIT) CAPS capsule Take 50,000 Units by mouth once a week. 08/18/2020: Wednesdays    No facility-administered encounter medications on file as of 04/28/2021.    ALLERGIES:  No Known Allergies   PHYSICAL EXAM:  ECOG PERFORMANCE STATUS: 1 - Symptomatic but completely ambulatory  There were no vitals filed for this visit. There were no vitals filed for this visit. Physical Exam Constitutional:      Appearance: Normal appearance. She is obese.  HENT:     Head: Normocephalic and atraumatic.     Mouth/Throat:     Mouth: Mucous membranes are moist.  Eyes:     Extraocular Movements: Extraocular movements intact.     Pupils: Pupils are equal, round, and reactive to light.  Cardiovascular:     Rate and Rhythm: Regular rhythm. Tachycardia present.     Pulses: Normal pulses.     Heart sounds: Normal heart sounds.  Pulmonary:     Effort: Pulmonary effort is normal.     Breath sounds: Normal breath sounds.  Abdominal:     General: Bowel sounds are normal.     Palpations: Abdomen is soft.     Tenderness: There is no abdominal tenderness.  Musculoskeletal:        General: No swelling.     Right lower leg: No edema.     Left lower leg: No edema.  Lymphadenopathy:     Cervical: No cervical adenopathy.  Skin:    General: Skin is warm and dry.  Neurological:     General: No focal deficit present.     Mental Status: She is alert and oriented to person, place, and time.  Psychiatric:        Mood and Affect: Mood normal.        Behavior: Behavior normal.     LABORATORY DATA:  I have reviewed the labs as listed.  CBC    Component Value Date/Time   WBC 6.3 04/21/2021 1025   RBC 3.98 04/21/2021 1025   HGB 11.7 (L) 04/21/2021 1025   HGB 12.6 10/24/2020 1318   HCT 36.1 04/21/2021 1025   HCT 39.0 10/24/2020 1318   PLT 220  04/21/2021 1025   PLT 277 10/24/2020 1318  MCV 90.7 04/21/2021 1025   MCV 88 10/24/2020 1318   MCH 29.4 04/21/2021 1025   MCHC 32.4 04/21/2021 1025   RDW 12.8 04/21/2021 1025   RDW 14.8 10/24/2020 1318   LYMPHSABS 2.0 04/21/2021 1025   LYMPHSABS 2.3 10/24/2020 1318   MONOABS 0.4 04/21/2021 1025   EOSABS 0.1 04/21/2021 1025   EOSABS 0.2 10/24/2020 1318   BASOSABS 0.0 04/21/2021 1025   BASOSABS 0.0 10/24/2020 1318   CMP Latest Ref Rng & Units 12/16/2020 10/24/2020 10/05/2020  Glucose 70 - 99 mg/dL 144(H) 80 110(H)  BUN 6 - 20 mg/dL 14 11 20   Creatinine 0.44 - 1.00 mg/dL 0.69 0.76 0.92  Sodium 135 - 145 mmol/L 138 141 138  Potassium 3.5 - 5.1 mmol/L 3.3(L) 4.2 3.7  Chloride 98 - 111 mmol/L 102 98 103  CO2 22 - 32 mmol/L 28 28 27   Calcium 8.9 - 10.3 mg/dL 9.1 10.2 9.1  Total Protein 6.5 - 8.1 g/dL 7.5 8.3 7.4  Total Bilirubin 0.3 - 1.2 mg/dL 0.5 0.3 0.3  Alkaline Phos 38 - 126 U/L 82 102 92  AST 15 - 41 U/L 14(L) 13 14(L)  ALT 0 - 44 U/L 16 17 19     DIAGNOSTIC IMAGING:  I have independently reviewed the relevant imaging and discussed with the patient.  ASSESSMENT & PLAN: 1.  Iron deficiency anemia with suspected occult GI bleed - ED on 07/18/2020 with Hgb 5.7, fatigue, lightheadedness.  Denied overt GI bleeding and was Hemoccult-negative, but does have a history of peptic ulcer disease.  She was transfused 2 units PRBCs on 07/18/2020, and followed up with gastroenterology.  - Colonoscopy (08/24/2020) showed diverticulosis, but was otherwise normal. - EGD (09/20/2020): Large hiatal hernia, Cameron lesions and antral erosions (suspicious for causing intermittent bleeding in the setting of ongoing NSAID use per GI note) - She is on Protonix for suspected recurrent peptic ulcer disease - Hematology work-up (07/29/2020) showed Hgb 8.5 (microcytic with MCV 78.2).  Ferritin was 7, with 32% iron saturation.  Reticulocytes 3.4 (reticulocyte index 1.61, consistent with hypoproliferation in the  setting of iron deficiency).   - Most recent IV Venofer 400 mg x 2 doses on 12/26/2020 and 01/02/2021 - Denies any GI blood loss, but reports abnormal vaginal bleeding x5 days earlier this year (despite last menstrual period in 2017), is following with her GYN   - Admits to fatigue, energy about 40% - Most recent labs (04/21/2021): Hgb 11.7, ferritin 22, iron saturation 10% - PLAN: Recommend IV Venofer 400 mg x 2 doses. - Continue GI and GYN follow-up.   - RTC in 4 months for repeat labs and follow-up.   PLAN SUMMARY & DISPOSITION: IV Venofer 400 mg x 2 doses Repeat labs and RTC in 4 months  All questions were answered. The patient knows to call the clinic with any problems, questions or concerns.  Medical decision making: Low  Time spent on visit: I spent 15 minutes counseling the patient face to face. The total time spent in the appointment was 20 minutes and more than 50% was on counseling.   Harriett Rush, PA-C  04/28/2021 10:09 AM

## 2021-04-28 ENCOUNTER — Inpatient Hospital Stay (HOSPITAL_BASED_OUTPATIENT_CLINIC_OR_DEPARTMENT_OTHER): Payer: Medicare Other | Admitting: Physician Assistant

## 2021-04-28 ENCOUNTER — Other Ambulatory Visit: Payer: Self-pay

## 2021-04-28 VITALS — BP 139/86 | HR 100 | Temp 97.9°F | Resp 18 | Ht 67.0 in | Wt 217.5 lb

## 2021-04-28 DIAGNOSIS — D500 Iron deficiency anemia secondary to blood loss (chronic): Secondary | ICD-10-CM

## 2021-04-28 DIAGNOSIS — D509 Iron deficiency anemia, unspecified: Secondary | ICD-10-CM | POA: Diagnosis not present

## 2021-04-28 NOTE — Patient Instructions (Signed)
[place] at [facility] Discharge Instructions  You were seen today by Tarri Abernethy PA-C for your iron deficiency anemia.  Your blood and iron levels remain low.  We recommend additional IV iron (Venofer 400 mg x 2 doses).  We will schedule you for this in the upcoming weeks.  We will see you for follow-up in 4 months.  LABS: Return in 4 months for repeat labs  MEDICATIONS: No changes to home medications  FOLLOW-UP APPOINTMENT: Office visit in 4 months   Thank you for choosing [place] at [facility] to provide your oncology and hematology care.  To afford each patient quality time with our provider, please arrive at least 15 minutes before your scheduled appointment time.   If you have a lab appointment with the [facility] please come in thru the Main Entrance and check in at the main information desk.  You need to re-schedule your appointment should you arrive 10 or more minutes late.  We strive to give you quality time with our providers, and arriving late affects you and other patients whose appointments are after yours.  Also, if you no show three or more times for appointments you may be dismissed from the clinic at the providers discretion.     Again, thank you for choosing [facility]  Our hope is that these requests will decrease the amount of time that you wait before being seen by our physicians.       _____________________________________________________________  Should you have questions after your visit to [facility], please contact our office at [phone] and follow the prompts.  Our office hours are 8:00 a.m. and 4:30 p.m. Monday - Friday.  Please note that voicemails left after 4:00 p.m. may not be returned until the following business day.  We are closed weekends and major holidays.  You do have access to a nurse 24-7, just call the main number to the clinic [phone] and do not  press any options, hold on the line and a nurse will answer the phone.    For prescription refill requests, have your pharmacy contact our office and allow 72 hours.    Due to Covid, you will need to wear a mask upon entering the hospital. If you do not have a mask, a mask will be given to you at the Main Entrance upon arrival. For doctor visits, patients may have 1 support person age 14 or older with them. For treatment visits, patients can not have anyone with them due to social distancing guidelines and our immunocompromised population.

## 2021-05-04 ENCOUNTER — Inpatient Hospital Stay (HOSPITAL_COMMUNITY): Payer: Medicare Other

## 2021-05-04 ENCOUNTER — Other Ambulatory Visit: Payer: Self-pay

## 2021-05-04 VITALS — BP 138/83 | HR 91 | Temp 97.1°F | Resp 18

## 2021-05-04 DIAGNOSIS — D500 Iron deficiency anemia secondary to blood loss (chronic): Secondary | ICD-10-CM

## 2021-05-04 DIAGNOSIS — D62 Acute posthemorrhagic anemia: Secondary | ICD-10-CM

## 2021-05-04 DIAGNOSIS — D509 Iron deficiency anemia, unspecified: Secondary | ICD-10-CM | POA: Diagnosis not present

## 2021-05-04 MED ORDER — ACETAMINOPHEN 325 MG PO TABS
650.0000 mg | ORAL_TABLET | Freq: Once | ORAL | Status: AC
  Administered 2021-05-04: 09:00:00 650 mg via ORAL
  Filled 2021-05-04: qty 2

## 2021-05-04 MED ORDER — SODIUM CHLORIDE 0.9 % IV SOLN
400.0000 mg | Freq: Once | INTRAVENOUS | Status: AC
  Administered 2021-05-04: 09:00:00 400 mg via INTRAVENOUS
  Filled 2021-05-04: qty 20

## 2021-05-04 MED ORDER — LORATADINE 10 MG PO TABS
10.0000 mg | ORAL_TABLET | Freq: Once | ORAL | Status: AC
  Administered 2021-05-04: 09:00:00 10 mg via ORAL
  Filled 2021-05-04: qty 1

## 2021-05-04 MED ORDER — SODIUM CHLORIDE 0.9 % IV SOLN: Freq: Once | INTRAVENOUS | Status: AC

## 2021-05-04 NOTE — Patient Instructions (Signed)
[facility]  Discharge Instructions: Thank you for choosing [facility] to provide your oncology and hematology care.  If you have a lab appointment with the Cancer Center, please come in thru the Main Entrance and check in at the main information desk.  Wear comfortable clothing and clothing appropriate for easy access to any Portacath or PICC line.   We strive to give you quality time with your provider. You may need to reschedule your appointment if you arrive late (15 or more minutes).  Arriving late affects you and other patients whose appointments are after yours.  Also, if you miss three or more appointments without notifying the office, you may be dismissed from the clinic at the provider's discretion.      For prescription refill requests, have your pharmacy contact our office and allow 72 hours for refills to be completed.        To help prevent nausea and vomiting after your treatment, we encourage you to take your nausea medication as directed.  BELOW ARE SYMPTOMS THAT SHOULD BE REPORTED IMMEDIATELY: *FEVER GREATER THAN 100.4 F (38 C) OR HIGHER *CHILLS OR SWEATING *NAUSEA AND VOMITING THAT IS NOT CONTROLLED WITH YOUR NAUSEA MEDICATION *UNUSUAL SHORTNESS OF BREATH *UNUSUAL BRUISING OR BLEEDING *URINARY PROBLEMS (pain or burning when urinating, or frequent urination) *BOWEL PROBLEMS (unusual diarrhea, constipation, pain near the anus) TENDERNESS IN MOUTH AND THROAT WITH OR WITHOUT PRESENCE OF ULCERS (sore throat, sores in mouth, or a toothache) UNUSUAL RASH, SWELLING OR PAIN  UNUSUAL VAGINAL DISCHARGE OR ITCHING   Items with * indicate a potential emergency and should be followed up as soon as possible or go to the Emergency Department if any problems should occur.  Please show the CHEMOTHERAPY ALERT CARD or IMMUNOTHERAPY ALERT CARD at check-in to the Emergency Department and triage nurse.  Should you have questions after your visit or need to cancel  or reschedule your appointment, please contact [facility] [phone]  and follow the prompts.  Office hours are 8:00 a.m. to 4:30 p.m. Monday - Friday. Please note that voicemails left after 4:00 p.m. may not be returned until the following business day.  We are closed weekends and major holidays. You have access to a nurse at all times for urgent questions. Please call the main number to the clinic [phone] and follow the prompts.  For any non-urgent questions, you may also contact your provider using MyChart. We now offer e-Visits for anyone 18 and older to request care online for non-urgent symptoms. For details visit [email or web address].   Also download the MyChart app! Go to the app store, search "MyChart", open the app, select [facility], and log in with your MyChart username and password.  Due to Covid, a mask is required upon entering the hospital/clinic. If you do not have a mask, one will be given to you upon arrival. For doctor visits, patients may have 1 support person aged 18 or older with them. For treatment visits, patients cannot have anyone with them due to current Covid guidelines and our immunocompromised population.  

## 2021-05-04 NOTE — Progress Notes (Signed)
Patient presents today for iron infusion.  Patient is in satisfactory condition with no new complaints voiced.  Vital signs are stable.  We will proceed with treatment per MD orders.   Patient tolerated treatment well with no complaints voiced.  Patient left ambulatory in stable condition.  Vital signs stable at discharge.  Follow up as scheduled.    

## 2021-05-12 ENCOUNTER — Inpatient Hospital Stay (HOSPITAL_COMMUNITY): Payer: Medicare Other

## 2021-05-19 ENCOUNTER — Inpatient Hospital Stay (HOSPITAL_COMMUNITY): Payer: Medicare Other | Attending: Hematology

## 2021-05-19 ENCOUNTER — Other Ambulatory Visit: Payer: Self-pay

## 2021-05-19 VITALS — BP 124/92 | HR 98 | Temp 97.3°F | Resp 18

## 2021-05-19 DIAGNOSIS — D509 Iron deficiency anemia, unspecified: Secondary | ICD-10-CM | POA: Insufficient documentation

## 2021-05-19 DIAGNOSIS — D62 Acute posthemorrhagic anemia: Secondary | ICD-10-CM

## 2021-05-19 DIAGNOSIS — D500 Iron deficiency anemia secondary to blood loss (chronic): Secondary | ICD-10-CM

## 2021-05-19 MED ORDER — LORATADINE 10 MG PO TABS
10.0000 mg | ORAL_TABLET | Freq: Once | ORAL | Status: AC
  Administered 2021-05-19: 10 mg via ORAL
  Filled 2021-05-19: qty 1

## 2021-05-19 MED ORDER — ACETAMINOPHEN 325 MG PO TABS
650.0000 mg | ORAL_TABLET | Freq: Once | ORAL | Status: AC
  Administered 2021-05-19: 650 mg via ORAL
  Filled 2021-05-19: qty 2

## 2021-05-19 MED ORDER — SODIUM CHLORIDE 0.9 % IV SOLN: Freq: Once | INTRAVENOUS | Status: AC

## 2021-05-19 MED ORDER — SODIUM CHLORIDE 0.9 % IV SOLN
400.0000 mg | Freq: Once | INTRAVENOUS | Status: AC
  Administered 2021-05-19: 400 mg via INTRAVENOUS
  Filled 2021-05-19: qty 20

## 2021-05-19 NOTE — Progress Notes (Signed)
Patient presents today for 400 Venofer infusion per providers order.  Vital signs WNL.  Patient has no new complaints at this time.    Peripheral IV started and blood return noted pre and post infusion.  Venofer infusion given today per MD orders.  Stable during infusion without adverse affects.  Vital signs stable.  No complaints at this time.    Discharge from clinic ambulatory in stable condition.  Alert and oriented X 3.  Follow up with [facility] as scheduled.

## 2021-05-19 NOTE — Patient Instructions (Addendum)
[facility]  Discharge Instructions: Thank you for choosing [facility] to provide your oncology and hematology care.  If you have a lab appointment with the Cancer Center, please come in thru the Main Entrance and check in at the main information desk.  Wear comfortable clothing and clothing appropriate for easy access to any Portacath or PICC line.   We strive to give you quality time with your provider. You may need to reschedule your appointment if you arrive late (15 or more minutes).  Arriving late affects you and other patients whose appointments are after yours.  Also, if you miss three or more appointments without notifying the office, you may be dismissed from the clinic at the provider's discretion.      For prescription refill requests, have your pharmacy contact our office and allow 72 hours for refills to be completed.    Today you received the following: Venofer   To help prevent nausea and vomiting after your treatment, we encourage you to take your nausea medication as directed.  BELOW ARE SYMPTOMS THAT SHOULD BE REPORTED IMMEDIATELY: *FEVER GREATER THAN 100.4 F (38 C) OR HIGHER *CHILLS OR SWEATING *NAUSEA AND VOMITING THAT IS NOT CONTROLLED WITH YOUR NAUSEA MEDICATION *UNUSUAL SHORTNESS OF BREATH *UNUSUAL BRUISING OR BLEEDING *URINARY PROBLEMS (pain or burning when urinating, or frequent urination) *BOWEL PROBLEMS (unusual diarrhea, constipation, pain near the anus) TENDERNESS IN MOUTH AND THROAT WITH OR WITHOUT PRESENCE OF ULCERS (sore throat, sores in mouth, or a toothache) UNUSUAL RASH, SWELLING OR PAIN  UNUSUAL VAGINAL DISCHARGE OR ITCHING   Items with * indicate a potential emergency and should be followed up as soon as possible or go to the Emergency Department if any problems should occur.  Please show the CHEMOTHERAPY ALERT CARD or IMMUNOTHERAPY ALERT CARD at check-in to the Emergency Department and triage nurse.  Should you have  questions after your visit or need to cancel or reschedule your appointment, please contact [facility] [phone]  and follow the prompts.  Office hours are 8:00 a.m. to 4:30 p.m. Monday - Friday. Please note that voicemails left after 4:00 p.m. may not be returned until the following business day.  We are closed weekends and major holidays. You have access to a nurse at all times for urgent questions. Please call the main number to the clinic [phone] and follow the prompts.  For any non-urgent questions, you may also contact your provider using MyChart. We now offer e-Visits for anyone 18 and older to request care online for non-urgent symptoms. For details visit [email or web address].   Also download the MyChart app! Go to the app store, search "MyChart", open the app, select [facility], and log in with your MyChart username and password.  Due to Covid, a mask is required upon entering the hospital/clinic. If you do not have a mask, one will be given to you upon arrival. For doctor visits, patients may have 1 support person aged 18 or older with them. For treatment visits, patients cannot have anyone with them due to current Covid guidelines and our immunocompromised population.  

## 2021-06-15 ENCOUNTER — Other Ambulatory Visit: Payer: Self-pay | Admitting: Family

## 2021-06-15 DIAGNOSIS — F411 Generalized anxiety disorder: Secondary | ICD-10-CM

## 2021-06-16 DIAGNOSIS — Z20822 Contact with and (suspected) exposure to covid-19: Secondary | ICD-10-CM | POA: Diagnosis not present

## 2021-07-18 DIAGNOSIS — Z9049 Acquired absence of other specified parts of digestive tract: Secondary | ICD-10-CM | POA: Diagnosis not present

## 2021-07-18 DIAGNOSIS — R634 Abnormal weight loss: Secondary | ICD-10-CM | POA: Diagnosis not present

## 2021-07-18 DIAGNOSIS — K9089 Other intestinal malabsorption: Secondary | ICD-10-CM | POA: Diagnosis not present

## 2021-07-19 ENCOUNTER — Ambulatory Visit: Payer: Medicare Other | Admitting: Gastroenterology

## 2021-07-19 ENCOUNTER — Telehealth: Payer: Self-pay | Admitting: Gastroenterology

## 2021-07-19 NOTE — Telephone Encounter (Signed)
I have attempted to call patient. I left a message. She has established care with [facility] and actually saw them yesterday.  ? ?I have told her we need to cancel this appt today. She has sought care elsewhere and is established. IF she happens to come today, please let her know this.  ? ?I have tried to call just in case.  ?

## 2021-07-25 ENCOUNTER — Encounter: Payer: Self-pay | Admitting: Family

## 2021-07-25 ENCOUNTER — Ambulatory Visit (INDEPENDENT_AMBULATORY_CARE_PROVIDER_SITE_OTHER): Payer: Medicare Other | Admitting: Family

## 2021-07-25 VITALS — BP 107/64 | HR 99 | Temp 97.1°F | Ht 67.0 in | Wt 216.6 lb

## 2021-07-25 DIAGNOSIS — W19XXXA Unspecified fall, initial encounter: Secondary | ICD-10-CM | POA: Diagnosis not present

## 2021-07-25 DIAGNOSIS — M25561 Pain in right knee: Secondary | ICD-10-CM | POA: Diagnosis not present

## 2021-07-25 DIAGNOSIS — M25562 Pain in left knee: Secondary | ICD-10-CM | POA: Diagnosis not present

## 2021-07-25 DIAGNOSIS — R531 Weakness: Secondary | ICD-10-CM

## 2021-07-25 MED ORDER — DICLOFENAC SODIUM 1 % EX GEL: 2.0000 g | Freq: Four times a day (QID) | CUTANEOUS | 2 refills | Status: DC

## 2021-07-25 MED ORDER — DICLOFENAC SODIUM 75 MG PO TBEC: 75.0000 mg | DELAYED_RELEASE_TABLET | Freq: Two times a day (BID) | ORAL | 0 refills | Status: DC

## 2021-07-25 NOTE — Progress Notes (Signed)
? ?Subjective:  ? ? Patient ID: Brittany Durham, female    DOB: 20-Mar-1971, 51 y.o.   MRN: [id number] ? ?Chief Complaint  ?Patient presents with  ? Knee Pain  ?  Bilateral knee pain and its making her fall they are giving out on her   ? ? ?Knee Pain  ?The incident occurred 2 days ago. The injury mechanism was a fall. The pain is present in the right knee and left knee. The pain is at a severity of 10/10. The pain is moderate. The pain has been Constant since onset. Pertinent negatives include no numbness or tingling. The symptoms are aggravated by weight bearing. She has tried acetaminophen for the symptoms. The treatment provided mild relief.  ?Fall ?The accident occurred 2 days ago. She landed on Concrete. The volume of blood lost was minimal. The pain is at a severity of 10/10. The pain is mild. Pertinent negatives include no numbness or tingling. She has tried acetaminophen for the symptoms. The treatment provided mild relief.  ? ? ? ?Review of Systems  ?Neurological:  Negative for tingling and numbness.  ?All other systems reviewed and are negative. ? ?   ?Objective:  ? Physical Exam ?Vitals reviewed.  ?Constitutional:   ?   General: She is not in acute distress. ?   Appearance: She is well-developed.  ?HENT:  ?   Head: Normocephalic and atraumatic.  ?Eyes:  ?   Pupils: Pupils are equal, round, and reactive to light.  ?Neck:  ?   Thyroid: No thyromegaly.  ?Cardiovascular:  ?   Rate and Rhythm: Normal rate and regular rhythm.  ?   Heart sounds: Normal heart sounds. No murmur heard. ?Pulmonary:  ?   Effort: Pulmonary effort is normal. No respiratory distress.  ?   Breath sounds: Normal breath sounds. No wheezing.  ?Abdominal:  ?   General: Bowel sounds are normal. There is no distension.  ?   Palpations: Abdomen is soft.  ?   Tenderness: There is no abdominal tenderness.  ?Musculoskeletal:     ?   General: Tenderness present. Normal range of motion.  ?   Cervical back: Normal range of motion and neck supple.  ?    Comments: Full ROM of bilateral knees, pain with flexion and extension   ?Skin: ?   General: Skin is warm and dry.  ?Neurological:  ?   Mental Status: She is alert and oriented to person, place, and time.  ?   Cranial Nerves: No cranial nerve deficit.  ?   Deep Tendon Reflexes: Reflexes are normal and symmetric.  ?Psychiatric:     ?   Behavior: Behavior normal.     ?   Thought Content: Thought content normal.     ?   Judgment: Judgment normal.  ? ? ?BP 107/64   Pulse 99   Temp (!) 97.1 ?F (36.2 ?C) (Temporal)   Ht '5\' 7"'$  (1.702 m)   Wt 216 lb 9.6 oz (98.2 kg)   LMP 02/18/2019   BMI 33.92 kg/m?  ? ? ? ?   ?Assessment & Plan:  ?Brittany Durham comes in today with chief complaint of Knee Pain (Bilateral knee pain and its making her fall they are giving out on her ) ? ? ?Diagnosis and orders addressed: ? ?1. Acute pain of both knees ?- diclofenac (VOLTAREN) 75 MG EC tablet; Take 1 tablet (75 mg total) by mouth 2 (two) times daily.  Dispense: 60 tablet; Refill: 0 ?- Ambulatory referral to Physical  Therapy ?- diclofenac Sodium (VOLTAREN) 1 % GEL; Apply 2 g topically 4 (four) times daily.  Dispense: 350 g; Refill: 2 ? ?2. Fall on concrete ?- diclofenac (VOLTAREN) 75 MG EC tablet; Take 1 tablet (75 mg total) by mouth 2 (two) times daily.  Dispense: 60 tablet; Refill: 0 ?- Ambulatory referral to Physical Therapy ?- diclofenac Sodium (VOLTAREN) 1 % GEL; Apply 2 g topically 4 (four) times daily.  Dispense: 350 g; Refill: 2 ? ?3. Weakness ?- diclofenac (VOLTAREN) 75 MG EC tablet; Take 1 tablet (75 mg total) by mouth 2 (two) times daily.  Dispense: 60 tablet; Refill: 0 ?- Ambulatory referral to Physical Therapy ?- diclofenac Sodium (VOLTAREN) 1 % GEL; Apply 2 g topically 4 (four) times daily.  Dispense: 350 g; Refill: 2 ? ?Diclofenac BID with food ?No other NSAID's ?Voltaren gel as needed ?Referral to PT to help with strengthening  ? ? ?Evelina Dun, FNP ? ? ? ?

## 2021-07-25 NOTE — Patient Instructions (Signed)
Acute Knee Pain, Adult Acute knee pain is sudden and may be caused by damage, swelling, or irritation of the muscles and tissues that support the knee. Pain may result from: A fall. An injury to the knee from twisting motions. A hit to the knee. Infection. Acute knee pain may go away on its own with time and rest. If it does not, your health care provider may order tests to find the cause of the pain. These may include: Imaging tests, such as an X-ray, MRI, CT scan, or ultrasound. Joint aspiration. In this test, fluid is removed from the knee and evaluated. Arthroscopy. In this test, a lighted tube is inserted into the knee and an image is projected onto a TV screen. Biopsy. In this test, a sample of tissue is removed from the body and studied under a microscope. Follow these instructions at home: If you have a knee sleeve or brace:  Wear the knee sleeve or brace as told by your health care provider. Remove it only as told by your health care provider. Loosen it if your toes tingle, become numb, or turn cold and blue. Keep it clean. If the knee sleeve or brace is not waterproof: Do not let it get wet. Cover it with a watertight covering when you take a bath or shower.  Activity Rest your knee. Do not do things that cause pain or make pain worse. Avoid high-impact activities or exercises, such as running, jumping rope, or doing jumping jacks. Work with a physical therapist to make a safe exercise program, as recommended by your health care provider. Do exercises as told by your physical therapist. Managing pain, stiffness, and swelling  If directed, put ice on the affected knee. To do this: If you have a removable knee sleeve or brace, remove it as told by your health care provider. Put ice in a plastic bag. Place a towel between your skin and the bag. Leave the ice on for 20 minutes, 2-3 times a day. Remove the ice if your skin turns bright red. This is very important. If you cannot  feel pain, heat, or cold, you have a greater risk of damage to the area. If directed, use an elastic bandage to put pressure (compression) on your injured knee. This may control swelling, give support, and help with discomfort. Raise (elevate) your knee above the level of your heart while you are sitting or lying down. Sleep with a pillow under your knee.  General instructions Take over-the-counter and prescription medicines only as told by your health care provider. Do not use any products that contain nicotine or tobacco, such as cigarettes, e-cigarettes, and chewing tobacco. If you need help quitting, ask your health care provider. If you are overweight, work with your health care provider and a dietitian to set a weight-loss goal that is healthy and reasonable for you. Extra weight can put pressure on your knee. Pay attention to any changes in your symptoms. Keep all follow-up visits. This is important. Contact a health care provider if: Your knee pain continues, changes, or gets worse. You have a fever along with knee pain. Your knee feels warm to the touch or is red. Your knee buckles or locks up. Get help right away if: Your knee swells, and the swelling becomes worse. You cannot move your knee. You have severe pain in your knee that cannot be managed with pain medicine. Summary Acute knee pain can be caused by a fall, an injury, an infection, or damage, swelling,   or irritation of the tissues that support your knee. Your health care provider may perform tests to find out the cause of the pain. Pay attention to any changes in your symptoms. Relieve your pain with rest, medicines, light activity, and the use of ice. Get help right away if your knee swells, you cannot move your knee, or you have severe pain that cannot be managed with medicine. This information is not intended to replace advice given to you by your health care provider. Make sure you discuss any questions you have with  your healthcare provider. Document Revised: 10/14/2019 Document Reviewed: 10/14/2019 Elsevier Patient Education  2022 [employer]  

## 2021-08-03 NOTE — Progress Notes (Signed)
?Cardiology Office Note:   ? ?Date:  08/07/2021  ? ?ID:  Brittany Durham, DOB December 30, 1970, MRN [id number] ? ?PCP:  Sharion Balloon, FNP  ?Cardiologist:  Buford Dresser, MD PhD ? ?Referring MD: Sharion Balloon, FNP  ? ?CC: follow up ? ?History of Present Illness:   ? ?Brittany Durham is a 51 y.o. female with a hx of atypical chest pain, hypertension, speech disorder who is seen for follow up. I initially saw her 01/29/19 as a new consult at the request of Sharion Balloon, FNP for the evaluation and management of chest pain. ? ?Cardiac history:  ?Brittany Durham has an undefined speech issue at baseline. Has been seen in [place] [facility] 01/06/19 for chest pain. Had gone frequently in the past but had not been in some time. That event was the result of gradually increasing central, sharp chest pain, worse with deep breathing, that became so severe that she sought emergency medical attention. At that visit, HsTn 6-> 7, not consistent with ACS. D-dimer normal. CXR without acute changes. ECG was sinus tach. She improved with toradal, ativan, and fluids. ? ?Chest pain history: ?-Initial onset: ~10 years ago ?-Quality: sharp, central, nonradiating, sometimes pleuritic ?-Frequency: had two years between event in August and prior event. ?-Associated symptoms: short of breath, nauseated, dizzy. Distant syncope but none recently (2-3 years ago) ?-Aggravating/alleviating factors: better when she sleeps, better with sucralfate but then comes back ?-Prior cardiac history: no MI. 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 Has never seen neurologist. ? ?Today: ?Since her last visit, she underwent laparoscopic cholecystectomy on 02/23/2021. ? ?Overall, she appears well. However, she is concerned about chest pain and palpitations that have been waking her up at night. These episodes occur at night, never in the day. They seem to be more frequent lately. Pushing on the  affected area will cause soreness. Soon after she wakes up, she may then develop a cough, and then notices hard and racing heart beats. Typically the episodes will last 10-15 minutes. She will sit up on the side of her bed, and wait until her symptoms resolve. Generally she lies in bed and watches TV until she falls asleep. Frequency is difficult to predict, does not think it would likely occur in a 2 week window. ? ?Also, she reports multiple falls lately. This has been attributable to weakness in her bilateral knees.  ? ?She denies any peripheral edema. No lightheadedness, headaches, syncope, orthopnea, or PND. ? ?Past Medical History:  ?Diagnosis Date  ? Anemia   ? Asthma   ? childhood  ? Carotidynia   ? COPD (chronic obstructive pulmonary disease) ([place])   ? DDD (degenerative disc disease), cervical   ? GERD (gastroesophageal reflux disease)   ? Hypertension   ? Knee pain   ? left meniscus tear s/p repair 11/2014  ? Seizures ([place])   ? last seizure was over 10 years ago, unknown etiology  ? Stuttering   ? patient reports this started after her knee surgery in 11/2014.   ? Thyroid nodule   ? ? ?Past Surgical History:  ?Procedure Laterality Date  ? ANTERIOR CERVICAL DECOMP/DISCECTOMY FUSION N/A 03/20/2019  ? Procedure: Anterior Cervical Decompression Fusion - Cervical Four-Cervical Five - Cervical Five-Cervical Six;  Surgeon: Eustace Moore, MD;  Location: [place];  Service: Neurosurgery;  Laterality: N/A;  Anterior Cervical Decompression Fusion - Cervical Four-Cervical Five - Cervical Five-Cervical Six  ? BIOPSY  09/20/2020  ? Procedure: BIOPSY;  Surgeon: Daneil Dolin, MD;  [facility];  Service: Endoscopy;;  ? COLONOSCOPY N/A 08/23/2016  ? Procedure: COLONOSCOPY;  Surgeon: Daneil Dolin, MD;  [facility];  Service: Endoscopy;  Laterality: N/A;  1:00pm  ? COLONOSCOPY N/A 08/24/2020  ? Diverticulosis in the entire examined colon. Redundant colon. Exam otherwise normal  ?  ESOPHAGOGASTRODUODENOSCOPY N/A 05/03/2016  ? Procedure: ESOPHAGOGASTRODUODENOSCOPY (EGD);  Surgeon: Daneil Dolin, MD;  [facility];  Service: Endoscopy;  Laterality: N/A;  ? ESOPHAGOGASTRODUODENOSCOPY N/A 08/23/2016  ? Procedure: ESOPHAGOGASTRODUODENOSCOPY (EGD);  Surgeon: Daneil Dolin, MD;  [facility];  Service: Endoscopy;  Laterality: N/A;  ? ESOPHAGOGASTRODUODENOSCOPY (EGD) WITH PROPOFOL N/A 09/20/2020  ? normal esophagus. Large hiatal hernia. Multiple gastric erosions (Cameron lesions). Normal duodenal bulb and second portion of the duodenum. Suspicion Brittany Durham lesions causing intermittent bleeding  ? KNEE ARTHROSCOPY Right   ? KNEE ARTHROSCOPY WITH MEDIAL MENISECTOMY Left 12/09/2014  ? Procedure: KNEE ARTHROSCOPY WITH PARTIAL MEDIAL AND LATERAL MENISECTOMY;  Surgeon: Sanjuana Kava, MD;  [facility];  Service: Orthopedics;  Laterality: Left;  ? TUBAL LIGATION    ? WISDOM TOOTH EXTRACTION    ? ? ?Current Medications: ?Current Outpatient Medications on File Prior to Visit  ?Medication Sig  ? amLODipine (NORVASC) 10 MG tablet TAKE 1 TABLET ONCE DAILY  ? busPIRone (BUSPAR) 5 MG tablet TAKE (1) TABLET UP TO THREE TIMES DAILY.  ? Cholecalciferol (D 2000) 50 MCG (2000 UT) TABS Take by mouth daily.  ? diclofenac (VOLTAREN) 75 MG EC tablet Take 1 tablet (75 mg total) by mouth 2 (two) times daily.  ? diclofenac Sodium (VOLTAREN) 1 % GEL Apply 2 g topically 4 (four) times daily.  ? dicyclomine (BENTYL) 10 MG capsule Take 1 capsule (10 mg total) by mouth 4 (four) times daily -  before meals and at bedtime. For cramping and loose stool. Stop if constipation.  ? docusate sodium (COLACE) 100 MG capsule Take 100 mg by mouth daily.  ? escitalopram (LEXAPRO) 20 MG tablet TAKE 1 TABLET DAILY.  ? ferrous sulfate 325 (65 FE) MG tablet Take 1 tablet by mouth daily.  ? lisinopril (ZESTRIL) 20 MG tablet TAKE 1 TABLET DAILY  ? meclizine (ANTIVERT) 25 MG tablet Take 1 tablet (25 mg total) by mouth 3  (three) times daily as needed for dizziness.  ? methocarbamol (ROBAXIN) 500 MG tablet Take by mouth every 8 (eight) hours as needed.  ? pantoprazole (PROTONIX) 40 MG tablet TAKE 1 TABLET ONCE DAILY BEFORE BREAKFAST  ? potassium chloride SA (KLOR-CON) 20 MEQ tablet Take 1 tablet (20 mEq total) by mouth 2 (two) times daily.  ? Vitamin D, Ergocalciferol, (DRISDOL) 1.25 MG (50000 UNIT) CAPS capsule Take 50,000 Units by mouth once a week.  ? ?No current facility-administered medications on file prior to visit.  ?  ? ?Allergies:   Patient has no known allergies.  ? ?Social History  ? ?Tobacco Use  ? Smoking status: Never  ? Smokeless tobacco: Never  ?Vaping Use  ? Vaping Use: Never used  ?Substance Use Topics  ? Alcohol use: No  ? Drug use: No  ? ? ?Family History: ?The patient's family history includes Hypertension in her brother and mother; Lung cancer in her father. There is no history of Colon cancer, Gastric cancer, or Esophageal cancer. ? ?ROS:   ?Please see the history of present illness.   ?(+) Nocturnal chest pain, palpitations ?(+) Cough ?(+)  Mechanical falls ?(+) Weakness in bilateral knees ?Additional pertinent ROS otherwise unremarkable. ? ?EKGs/Labs/Other Studies Reviewed:   ? ?The following studies were reviewed today: ? ?Lexiscan 03/19/19 ?Nuclear stress EF: 51%. Visually, the EF appears to be greater than the computer calculated 51% and appears normal ?There was no ST segment deviation noted during stress. ?This is a low risk study. There is no evidence of ischemia or previous infarction ?The study is normal ? ?Echo 09/2016: ?Study Conclusions  ? ?- Left ventricle: The cavity size was normal. Wall thickness was  ?  normal. Systolic function was normal. The estimated ejection  ?  fraction was in the range of 60% to 65%. Wall motion was normal;  ?  there were no regional wall motion abnormalities. Doppler  ?  parameters are consistent with abnormal left ventricular  ?  relaxation (grade 1 diastolic  dysfunction).  ?- Aortic valve: Valve area (VTI): 1.62 cm^2. Valve area (Vmax):  ?  1.62 cm^2.  ?- Technically adequate study.  ? ?Echo 12/2014:  ?Study Conclusions  ? ?- Left ventricle: The cavity size was normal. Wall thickness was  ?  increa

## 2021-08-07 ENCOUNTER — Ambulatory Visit (INDEPENDENT_AMBULATORY_CARE_PROVIDER_SITE_OTHER): Payer: Medicare Other | Admitting: Cardiology

## 2021-08-07 ENCOUNTER — Other Ambulatory Visit: Payer: Self-pay

## 2021-08-07 ENCOUNTER — Encounter (HOSPITAL_BASED_OUTPATIENT_CLINIC_OR_DEPARTMENT_OTHER): Payer: Self-pay | Admitting: Cardiology

## 2021-08-07 VITALS — BP 110/70 | HR 92 | Ht 67.0 in | Wt 217.9 lb

## 2021-08-07 DIAGNOSIS — I10 Essential (primary) hypertension: Secondary | ICD-10-CM

## 2021-08-07 DIAGNOSIS — Z7189 Other specified counseling: Secondary | ICD-10-CM

## 2021-08-07 DIAGNOSIS — R072 Precordial pain: Secondary | ICD-10-CM

## 2021-08-07 DIAGNOSIS — R002 Palpitations: Secondary | ICD-10-CM | POA: Diagnosis not present

## 2021-08-07 NOTE — Patient Instructions (Signed)
Medication Instructions:  ?Your Physician recommend you continue on your current medication as directed.   ? ?*If you need a refill on your cardiac medications before your next appointment, please call your pharmacy* ? ? ?Lab Work: ?None ordered today ? ? ?Testing/Procedures: ?None ordered today ? ? ?Follow-Up: ?At [facility], you and your health needs are our priority.  As part of our continuing mission to provide you with exceptional heart care, we have created designated Provider Care Teams.  These Care Teams include your primary Cardiologist (physician) and Advanced Practice Providers (APPs -  Physician Assistants and Nurse Practitioners) who all work together to provide you with the care you need, when you need it. ? ?We recommend signing up for the patient portal called "MyChart".  Sign up information is provided on this After Visit Summary.  MyChart is used to connect with patients for Virtual Visits (Telemedicine).  Patients are able to view lab/test results, encounter notes, upcoming appointments, etc.  Non-urgent messages can be sent to your provider as well.   ?To learn more about what you can do with MyChart, go to [email or web address].   ? ?Your next appointment:   ?1 year(s) ? ?The format for your next appointment:   ?In Person ? ?Provider:   ?Buford Dresser, MD{ ? ?I ?

## 2021-08-09 ENCOUNTER — Ambulatory Visit: Payer: Medicare Other | Attending: Family

## 2021-08-09 ENCOUNTER — Other Ambulatory Visit: Payer: Self-pay

## 2021-08-09 DIAGNOSIS — M25561 Pain in right knee: Secondary | ICD-10-CM | POA: Diagnosis not present

## 2021-08-09 DIAGNOSIS — G8929 Other chronic pain: Secondary | ICD-10-CM | POA: Diagnosis not present

## 2021-08-09 DIAGNOSIS — M6281 Muscle weakness (generalized): Secondary | ICD-10-CM | POA: Diagnosis not present

## 2021-08-09 DIAGNOSIS — M25562 Pain in left knee: Secondary | ICD-10-CM | POA: Insufficient documentation

## 2021-08-09 DIAGNOSIS — W19XXXA Unspecified fall, initial encounter: Secondary | ICD-10-CM | POA: Insufficient documentation

## 2021-08-09 DIAGNOSIS — Z9181 History of falling: Secondary | ICD-10-CM | POA: Insufficient documentation

## 2021-08-09 NOTE — Therapy (Signed)
[facility] ?[place] ?[place], [place], [address] ?Phone: [phone]   Fax:  [phone] ? ?Physical Therapy Evaluation ? ?Patient Details  ?Name: Brittany Durham ?MRN: [id number] ?Date of Birth: 1970/09/21 ?Referring Provider (PT): Lenna Gilford, [place] ? ? ?Encounter Date: 08/09/2021 ? ? PT End of Session - 08/09/21 1120   ? ? Visit Number 1   ? Number of Visits 12   ? Date for PT Re-Evaluation 10/06/21   ? PT Start Time 1119   ? PT Stop Time 1202   ? PT Time Calculation (min) 43 min   ? Activity Tolerance Patient tolerated treatment well   ? Behavior During Therapy [facility] for tasks assessed/performed   ? ?  ?  ? ?  ? ? ?Past Medical History:  ?Diagnosis Date  ? Anemia   ? Asthma   ? childhood  ? Carotidynia   ? COPD (chronic obstructive pulmonary disease) ([place])   ? DDD (degenerative disc disease), cervical   ? GERD (gastroesophageal reflux disease)   ? Hypertension   ? Knee pain   ? left meniscus tear s/p repair 11/2014  ? Seizures ([place])   ? last seizure was over 10 years ago, unknown etiology  ? Stuttering   ? patient reports this started after her knee surgery in 11/2014.   ? Thyroid nodule   ? ? ?Past Surgical History:  ?Procedure Laterality Date  ? ANTERIOR CERVICAL DECOMP/DISCECTOMY FUSION N/A 03/20/2019  ? Procedure: Anterior Cervical Decompression Fusion - Cervical Four-Cervical Five - Cervical Five-Cervical Six;  Surgeon: Eustace Moore, MD;  Location: [place];  Service: Neurosurgery;  Laterality: N/A;  Anterior Cervical Decompression Fusion - Cervical Four-Cervical Five - Cervical Five-Cervical Six  ? BIOPSY  09/20/2020  ? Procedure: BIOPSY;  Surgeon: Daneil Dolin, MD;  [facility];  Service: Endoscopy;;  ? COLONOSCOPY N/A 08/23/2016  ? Procedure: COLONOSCOPY;  Surgeon: Daneil Dolin, MD;  [facility];  Service: Endoscopy;  Laterality: N/A;  1:00pm  ? COLONOSCOPY N/A 08/24/2020  ? Diverticulosis in the entire examined colon. Redundant colon. Exam  otherwise normal  ? ESOPHAGOGASTRODUODENOSCOPY N/A 05/03/2016  ? Procedure: ESOPHAGOGASTRODUODENOSCOPY (EGD);  Surgeon: Daneil Dolin, MD;  [facility];  Service: Endoscopy;  Laterality: N/A;  ? ESOPHAGOGASTRODUODENOSCOPY N/A 08/23/2016  ? Procedure: ESOPHAGOGASTRODUODENOSCOPY (EGD);  Surgeon: Daneil Dolin, MD;  [facility];  Service: Endoscopy;  Laterality: N/A;  ? ESOPHAGOGASTRODUODENOSCOPY (EGD) WITH PROPOFOL N/A 09/20/2020  ? normal esophagus. Large hiatal hernia. Multiple gastric erosions (Cameron lesions). Normal duodenal bulb and second portion of the duodenum. Suspicion Lysbeth Galas lesions causing intermittent bleeding  ? KNEE ARTHROSCOPY Right   ? KNEE ARTHROSCOPY WITH MEDIAL MENISECTOMY Left 12/09/2014  ? Procedure: KNEE ARTHROSCOPY WITH PARTIAL MEDIAL AND LATERAL MENISECTOMY;  Surgeon: Sanjuana Kava, MD;  [facility];  Service: Orthopedics;  Laterality: Left;  ? TUBAL LIGATION    ? WISDOM TOOTH EXTRACTION    ? ? ?There were no vitals filed for this visit. ? ? ? Subjective Assessment - 08/09/21 1120   ? ? Subjective Patient reports that her legs started get weak in November 2022. She feels that her legs will give away which will cause her to fall. She feels that it has been getting worse since then. She has to ride on the scooters to go shopping because there is no warning on when her legs will give out. She has a walker at home, but does not use it.   ?  Limitations Walking;Standing   ? How long can you stand comfortably? pain with any standing or walking   ? How long can you walk comfortably? pain with any standing or walking   ? Patient Stated Goals sleep through the night (2 hours), reduced pain, reduced fall risk   ? Currently in Pain? Yes   ? Pain Score 10-Worst pain ever   ? Pain Location Knee   ? Pain Orientation Right;Left;Anterior   ? Pain Descriptors / Indicators Aching;Sharp   ? Pain Type Chronic pain   ? Pain Onset More than a month ago   ? Pain Frequency Constant    ? Aggravating Factors  standing, walking   ? Pain Relieving Factors none found   ? Effect of Pain on Daily Activities she has to do as much as she can, but she lives alone   ? ?  ?  ? ?  ? ? ? ? ? [facility] PT Assessment - 08/09/21 0001   ? ?  ? Assessment  ? Medical Diagnosis Acute pain of both knees   ? Referring Provider (PT) Lenna Gilford, [place]   ? Onset Date/Surgical Date --   2022  ? Next MD Visit 08/10/21   ? Prior Therapy Yes, years ago   ?  ? Precautions  ? Precautions Fall   ?  ? Restrictions  ? Weight Bearing Restrictions No   ?  ? Balance Screen  ? Has the patient fallen in the past 6 months Yes   ? How many times? 3   ? Has the patient had a decrease in activity level because of a fear of falling?  Yes   ? Is the patient reluctant to leave their home because of a fear of falling?  Yes   ?  ? Home Environment  ? Living Environment Private residence   ? Living Arrangements Alone   ? Type of Home Apartment   ? Home Access Stairs to enter   ? Entrance Stairs-Number of Steps 2   ? Entrance Stairs-Rails None   ? Home Layout One level   ? Long - 2 wheels   ?  ? Prior Function  ? Level of Independence Independent   ? Leisure caring for her grandson   ?  ? Cognition  ? Overall Cognitive Status Within Functional Limits for tasks assessed   ? Attention Focused   ? Focused Attention Appears intact   ? Memory Appears intact   ? Awareness Appears intact   ? Problem Solving Appears intact   ?  ? Sensation  ? Additional Comments Patient reports no numbness or tingling   ?  ? ROM / Strength  ? AROM / PROM / Strength Strength;AROM   ?  ? AROM  ? Overall AROM  Within functional limits for tasks performed   ?  ? Strength  ? Strength Assessment Site Hip;Knee;Ankle   ? Right/Left Hip Right;Left   ? Right Hip Flexion 4/5   ? Left Hip Flexion 4/5   ? Right/Left Knee Right;Left   ? Right Knee Flexion 4/5   ? Right Knee Extension 4-/5   ? Left Knee Flexion 4/5   painful  ? Left Knee Extension 4/5   painful  ? Right/Left Ankle  Right;Left   ? Right Ankle Dorsiflexion 4-/5   ? Left Ankle Dorsiflexion 4-/5   ?  ? Palpation  ? Patella mobility Hypomobile and painful   ? Palpation comment TTP: bilateral quadriceps tendons, patella, and patellar  tendons   ?  ? Transfers  ? Transfers Sit to Stand;Stand to Sit   ? Sit to Stand 6: Modified independent (Device/Increase time);Without upper extremity assist   ? Five time sit to stand comments  43.54   ? Stand to Sit 6: Modified independent (Device/Increase time);Without upper extremity assist   ?  ? Ambulation/Gait  ? Ambulation/Gait Yes   ? Ambulation/Gait Assistance 7: Independent;6: Modified independent (Device/Increase time)   recommended that she utilize an assistive device for improved safety  ? Assistive device None   ? Gait Pattern Step-through pattern;Decreased stride length;Poor foot clearance - left;Poor foot clearance - right   ? Ambulation Surface Level;Indoor   ? Gait velocity decreased   ? ?  ?  ? ?  ? ? ? ? ? ? ? ? ? ? ? ? ? ?Objective measurements completed on examination: See above findings.  ? ? ? ? ? [place] [facility] Adult PT Treatment/Exercise - 08/09/21 0001   ? ?  ? Modalities  ? Modalities Cryotherapy   ?  ? Cryotherapy  ? Number Minutes Cryotherapy 10 Minutes   ? Cryotherapy Location Knee   both knees  ? Type of Cryotherapy Ice pack   ? ?  ?  ? ?  ? ? ? ? ? ? ? ? ? ? ? ? ? ? ? PT Long Term Goals - 08/09/21 1308   ? ?  ? PT LONG TERM GOAL #1  ? Title Patient will be independent with her HEP.   ? Time 4   ? Period Weeks   ? Status New   ? Target Date 09/06/21   ?  ? PT LONG TERM GOAL #2  ? Title Patient will be able to improve her five time sit to stand time to 25 seconds or less for imporved lower extremity power.   ? Time 4   ? Period Weeks   ? Status New   ? Target Date 09/06/21   ?  ? PT LONG TERM GOAL #3  ? Title Patient will report being able to sleep at least 4 hours without being awakened by her familiar knee pain.   ? Time 4   ? Period Weeks   ? Status New   ? Target Date 09/06/21    ?  ? PT LONG TERM GOAL #4  ? Title Patient will be able to safely navigate at least 2 steps for improved safety getting into and out of her appartment.   ? Time 4   ? Period Weeks   ? Status New   ? T

## 2021-08-10 ENCOUNTER — Encounter: Payer: Self-pay | Admitting: Family

## 2021-08-10 ENCOUNTER — Ambulatory Visit (INDEPENDENT_AMBULATORY_CARE_PROVIDER_SITE_OTHER): Payer: Medicare Other | Admitting: Family

## 2021-08-10 VITALS — BP 104/64 | HR 114 | Temp 97.0°F | Ht 67.0 in | Wt 215.6 lb

## 2021-08-10 DIAGNOSIS — D500 Iron deficiency anemia secondary to blood loss (chronic): Secondary | ICD-10-CM | POA: Diagnosis not present

## 2021-08-10 DIAGNOSIS — M159 Polyosteoarthritis, unspecified: Secondary | ICD-10-CM | POA: Diagnosis not present

## 2021-08-10 DIAGNOSIS — K219 Gastro-esophageal reflux disease without esophagitis: Secondary | ICD-10-CM | POA: Diagnosis not present

## 2021-08-10 DIAGNOSIS — F321 Major depressive disorder, single episode, moderate: Secondary | ICD-10-CM

## 2021-08-10 DIAGNOSIS — G4700 Insomnia, unspecified: Secondary | ICD-10-CM | POA: Diagnosis not present

## 2021-08-10 DIAGNOSIS — Z23 Encounter for immunization: Secondary | ICD-10-CM

## 2021-08-10 DIAGNOSIS — I10 Essential (primary) hypertension: Secondary | ICD-10-CM

## 2021-08-10 DIAGNOSIS — F411 Generalized anxiety disorder: Secondary | ICD-10-CM

## 2021-08-10 NOTE — Patient Instructions (Signed)
Health Maintenance After Age 53 After age 53, you are at a higher risk for certain long-term diseases and infections as well as injuries from falls. Falls are a major cause of broken bones and head injuries in people who are older than age 53. Getting regular preventive care can help to keep you healthy and well. Preventive care includes getting regular testing and making lifestyle changes as recommended by your health care provider. Talk with your health care provider about: Which screenings and tests you should have. A screening is a test that checks for a disease when you have no symptoms. A diet and exercise plan that is right for you. What should I know about screenings and tests to prevent falls? Screening and testing are the best ways to find a health problem early. Early diagnosis and treatment give you the best chance of managing medical conditions that are common after age 53. Certain conditions and lifestyle choices may make you more likely to have a fall. Your health care provider may recommend: Regular vision checks. Poor vision and conditions such as cataracts can make you more likely to have a fall. If you wear glasses, make sure to get your prescription updated if your vision changes. Medicine review. Work with your health care provider to regularly review all of the medicines you are taking, including over-the-counter medicines. Ask your health care provider about any side effects that may make you more likely to have a fall. Tell your health care provider if any medicines that you take make you feel dizzy or sleepy. Strength and balance checks. Your health care provider may recommend certain tests to check your strength and balance while standing, walking, or changing positions. Foot health exam. Foot pain and numbness, as well as not wearing proper footwear, can make you more likely to have a fall. Screenings, including: Osteoporosis screening. Osteoporosis is a condition that causes  the bones to get weaker and break more easily. Blood pressure screening. Blood pressure changes and medicines to control blood pressure can make you feel dizzy. Depression screening. You may be more likely to have a fall if you have a fear of falling, feel depressed, or feel unable to do activities that you used to do. Alcohol use screening. Using too much alcohol can affect your balance and may make you more likely to have a fall. Follow these instructions at home: Lifestyle Do not drink alcohol if: Your health care provider tells you not to drink. If you drink alcohol: Limit how much you have to: 0-1 drink a day for women. 0-2 drinks a day for men. Know how much alcohol is in your drink. In the [place] one drink equals one 12 oz bottle of beer (355 mL), one 5 oz glass of wine (148 mL), or one 1 oz glass of hard liquor (44 mL). Do not use any products that contain nicotine or tobacco. These products include cigarettes, chewing tobacco, and vaping devices, such as e-cigarettes. If you need help quitting, ask your health care provider. Activity  Follow a regular exercise program to stay fit. This will help you maintain your balance. Ask your health care provider what types of exercise are appropriate for you. If you need a cane or walker, use it as recommended by your health care provider. Wear supportive shoes that have nonskid soles. Safety  Remove any tripping hazards, such as rugs, cords, and clutter. Install safety equipment such as grab bars in bathrooms and safety rails on stairs. Keep rooms and walkways   well-lit. General instructions Talk with your health care provider about your risks for falling. Tell your health care provider if: You fall. Be sure to tell your health care provider about all falls, even ones that seem minor. You feel dizzy, tiredness (fatigue), or off-balance. Take over-the-counter and prescription medicines only as told by your health care provider. These include  supplements. Eat a healthy diet and maintain a healthy weight. A healthy diet includes low-fat dairy products, low-fat (lean) meats, and fiber from whole grains, beans, and lots of fruits and vegetables. Stay current with your vaccines. Schedule regular health, dental, and eye exams. Summary Having a healthy lifestyle and getting preventive care can help to protect your health and wellness after age 53. Screening and testing are the best way to find a health problem early and help you avoid having a fall. Early diagnosis and treatment give you the best chance for managing medical conditions that are more common for people who are older than age 53. Falls are a major cause of broken bones and head injuries in people who are older than age 53. Take precautions to prevent a fall at home. Work with your health care provider to learn what changes you can make to improve your health and wellness and to prevent falls. This information is not intended to replace advice given to you by your health care provider. Make sure you discuss any questions you have with your health care provider. Document Revised: 09/19/2020 Document Reviewed: 09/19/2020 Elsevier Patient Education  2022 [employer]  

## 2021-08-10 NOTE — Addendum Note (Signed)
Addended by: Brynda Peon F on: 08/10/2021 11:58 AM ? ? Modules accepted: Orders ? ?

## 2021-08-10 NOTE — Progress Notes (Signed)
? ?Subjective:  ? ? Patient ID: Brittany Durham, female    DOB: 1970/10/20, 51 y.o.   MRN: [id number] ? ?Chief Complaint  ?Patient presents with  ? Medical Management of Chronic Issues  ? ?PT presents to the office today for chronic follow up. Pt is followed by Neurologists for Speech difficulties. She is followed by GI every 6 months for GERD and diverticulitis and right flank pain. She is scheduled for Cholecystectomy on 02/23/21. Since then her flank pain has improved.  ?Hypertension ?This is a chronic problem. The current episode started more than 1 year ago. The problem has been resolved since onset. The problem is controlled. Associated symptoms include anxiety. Pertinent negatives include no malaise/fatigue, peripheral edema or shortness of breath. Risk factors for coronary artery disease include dyslipidemia and obesity. The current treatment provides moderate improvement.  ?Gastroesophageal Reflux ?She complains of belching and heartburn. She reports no hoarse voice. This is a chronic problem. The current episode started more than 1 year ago. The problem occurs occasionally. The problem has been waxing and waning. Risk factors include obesity. She has tried a PPI for the symptoms. The treatment provided moderate relief.  ?Arthritis ?Presents for follow-up visit. She complains of pain and stiffness. The symptoms have been stable. Affected locations include the left knee, right knee, right MCP and left MCP. Her pain is at a severity of 7/10.  ?Anemia ?Presents for follow-up visit. There has been no confusion, leg swelling or malaise/fatigue.  ?Insomnia ?Primary symptoms: difficulty falling asleep, frequent awakening, no malaise/fatigue.   ?The current episode started more than one year. PMH includes: depression.   ?Anxiety ?Presents for follow-up visit. Symptoms include excessive worry, insomnia, irritability and nervous/anxious behavior. Patient reports no confusion or shortness of breath. The severity of  symptoms is moderate.  ? ?Her past medical history is significant for anemia.  ?Depression ?       This is a chronic problem.  The current episode started more than 1 year ago.   The onset quality is gradual.   The problem occurs intermittently.  Associated symptoms include insomnia.  Associated symptoms include no helplessness, no hopelessness and not irritable.  Past medical history includes anxiety.   ? ? ? ?Review of Systems  ?Constitutional:  Positive for irritability. Negative for malaise/fatigue.  ?HENT:  Negative for hoarse voice.   ?Respiratory:  Negative for shortness of breath.   ?Gastrointestinal:  Positive for heartburn.  ?Musculoskeletal:  Positive for arthritis and stiffness.  ?Psychiatric/Behavioral:  Positive for depression. Negative for confusion. The patient is nervous/anxious and has insomnia.   ?All other systems reviewed and are negative. ? ?   ?Objective:  ? Physical Exam ?Vitals reviewed.  ?Constitutional:   ?   General: She is not irritable.She is not in acute distress. ?   Appearance: She is well-developed.  ?HENT:  ?   Head: Normocephalic and atraumatic.  ?   Right Ear: Tympanic membrane normal.  ?   Left Ear: Tympanic membrane normal.  ?Eyes:  ?   Pupils: Pupils are equal, round, and reactive to light.  ?Neck:  ?   Thyroid: No thyromegaly.  ?Cardiovascular:  ?   Rate and Rhythm: Normal rate and regular rhythm.  ?   Heart sounds: Normal heart sounds. No murmur heard. ?Pulmonary:  ?   Effort: Pulmonary effort is normal. No respiratory distress.  ?   Breath sounds: Normal breath sounds. No wheezing.  ?Abdominal:  ?   General: Bowel sounds are normal. There is no  distension.  ?   Palpations: Abdomen is soft.  ?   Tenderness: There is no abdominal tenderness.  ?Musculoskeletal:     ?   General: No tenderness. Normal range of motion.  ?   Cervical back: Normal range of motion and neck supple.  ?Skin: ?   General: Skin is warm and dry.  ?Neurological:  ?   Mental Status: She is alert and  oriented to person, place, and time.  ?   Cranial Nerves: No cranial nerve deficit.  ?   Deep Tendon Reflexes: Reflexes are normal and symmetric.  ?Psychiatric:     ?   Speech: Slurred: stuttering.     ?   Behavior: Behavior normal.     ?   Thought Content: Thought content normal.     ?   Judgment: Judgment normal.  ? ? ? ?  BP 104/64   Pulse (!) 114   Temp (!) 97 ?F (36.1 ?C) (Temporal)   Ht 5' 7"  (1.702 m)   Wt 215 lb 9.6 oz (97.8 kg)   LMP 02/18/2019   BMI 33.77 kg/m?  ? ?Assessment & Plan:  ?Brittany Durham comes in today with chief complaint of Medical Management of Chronic Issues ? ? ?Diagnosis and orders addressed: ? ?1. HYPERTENSION, BENIGN ?- CMP14+EGFR ? ?2. Gastroesophageal reflux disease without esophagitis ?- CMP14+EGFR ? ?3. Primary osteoarthritis involving multiple joints ?- CMP14+EGFR ? ?4. Depression, major, single episode, moderate ([place]) ?- CMP14+EGFR ? ?5. GAD (generalized anxiety disorder) ?- CMP14+EGFR ? ?6. Insomnia, unspecified type ?- CMP14+EGFR ? ?7. Iron deficiency anemia due to chronic blood loss ?- CMP14+EGFR ? ? ?Labs pending ?Health Maintenance reviewed ?Diet and exercise encouraged ? ?Follow up plan: ?3 months for pap ? ? ?Evelina Dun, FNP ? ? ? ?

## 2021-08-11 LAB — CMP14+EGFR
ALT: 16 IU/L (ref 0–32)
AST: 15 IU/L (ref 0–40)
Albumin/Globulin Ratio: 1.8 (ref 1.2–2.2)
Albumin: 4.8 g/dL (ref 3.8–4.9)
Alkaline Phosphatase: 108 IU/L (ref 44–121)
BUN/Creatinine Ratio: 15 (ref 9–23)
BUN: 14 mg/dL (ref 6–24)
Bilirubin Total: 0.4 mg/dL (ref 0.0–1.2)
CO2: 21 mmol/L (ref 20–29)
Calcium: 9.9 mg/dL (ref 8.7–10.2)
Chloride: 103 mmol/L (ref 96–106)
Creatinine, Ser: 0.91 mg/dL (ref 0.57–1.00)
Globulin, Total: 2.6 g/dL (ref 1.5–4.5)
Glucose: 93 mg/dL (ref 70–99)
Potassium: 4.2 mmol/L (ref 3.5–5.2)
Sodium: 144 mmol/L (ref 134–144)
Total Protein: 7.4 g/dL (ref 6.0–8.5)
eGFR: 76 mL/min/{1.73_m2} (ref >59)

## 2021-08-14 ENCOUNTER — Ambulatory Visit: Payer: Medicare Other | Attending: Family

## 2021-08-14 DIAGNOSIS — M25562 Pain in left knee: Secondary | ICD-10-CM | POA: Insufficient documentation

## 2021-08-14 DIAGNOSIS — M6281 Muscle weakness (generalized): Secondary | ICD-10-CM | POA: Diagnosis not present

## 2021-08-14 DIAGNOSIS — M25561 Pain in right knee: Secondary | ICD-10-CM | POA: Insufficient documentation

## 2021-08-14 DIAGNOSIS — G8929 Other chronic pain: Secondary | ICD-10-CM | POA: Insufficient documentation

## 2021-08-14 DIAGNOSIS — Z9181 History of falling: Secondary | ICD-10-CM | POA: Insufficient documentation

## 2021-08-14 NOTE — Therapy (Addendum)
[facility] [place], [place], [address] Phone: [phone]   Fax:  [phone]  Physical Therapy Treatment  Patient Details  Name: Brittany Durham MRN: [id number] Date of Birth: Mar 30, 1971 Referring Provider (PT): [place], [place]   Encounter Date: 08/14/2021   PT End of Session - 08/14/21 1009     Visit Number 2    Number of Visits 12    Date for PT Re-Evaluation 10/06/21    PT Start Time 1005    PT Stop Time 1108    PT Time Calculation (min) 63 min    Activity Tolerance Patient tolerated treatment well    Behavior During Therapy [facility] for tasks assessed/performed             Past Medical History:  Diagnosis Date   Anemia    Asthma    childhood   Carotidynia    COPD (chronic obstructive pulmonary disease) ([place])    DDD (degenerative disc disease), cervical    GERD (gastroesophageal reflux disease)    Hypertension    Knee pain    left meniscus tear s/p repair 11/2014   Seizures ([place])    last seizure was over 10 years ago, unknown etiology   Stuttering    patient reports this started after her knee surgery in 11/2014.    Thyroid nodule     Past Surgical History:  Procedure Laterality Date   ANTERIOR CERVICAL DECOMP/DISCECTOMY FUSION N/A 03/20/2019   Procedure: Anterior Cervical Decompression Fusion - Cervical Four-Cervical Five - Cervical Five-Cervical Six;  Surgeon: Eustace Moore, MD;  Location: [facility];  Service: Neurosurgery;  Laterality: N/A;  Anterior Cervical Decompression Fusion - Cervical Four-Cervical Five - Cervical Five-Cervical Six   BIOPSY  09/20/2020   Procedure: BIOPSY;  Surgeon: Daneil Dolin, MD;  [facility];  Service: Endoscopy;;   COLONOSCOPY N/A 08/23/2016   Procedure: COLONOSCOPY;  Surgeon: Daneil Dolin, MD;  [facility];  Service: Endoscopy;  Laterality: N/A;  1:00pm   COLONOSCOPY N/A 08/24/2020   Diverticulosis in the entire examined colon. Redundant colon. Exam  otherwise normal   ESOPHAGOGASTRODUODENOSCOPY N/A 05/03/2016   Procedure: ESOPHAGOGASTRODUODENOSCOPY (EGD);  Surgeon: Daneil Dolin, MD;  [facility];  Service: Endoscopy;  Laterality: N/A;   ESOPHAGOGASTRODUODENOSCOPY N/A 08/23/2016   Procedure: ESOPHAGOGASTRODUODENOSCOPY (EGD);  Surgeon: Daneil Dolin, MD;  [facility];  Service: Endoscopy;  Laterality: N/A;   ESOPHAGOGASTRODUODENOSCOPY (EGD) WITH PROPOFOL N/A 09/20/2020   normal esophagus. Large hiatal hernia. Multiple gastric erosions (Cameron lesions). Normal duodenal bulb and second portion of the duodenum. Suspicion Lysbeth Galas lesions causing intermittent bleeding   KNEE ARTHROSCOPY Right    KNEE ARTHROSCOPY WITH MEDIAL MENISECTOMY Left 12/09/2014   Procedure: KNEE ARTHROSCOPY WITH PARTIAL MEDIAL AND LATERAL MENISECTOMY;  Surgeon: Sanjuana Kava, MD;  [facility];  Service: Orthopedics;  Laterality: Left;   TUBAL LIGATION     WISDOM TOOTH EXTRACTION      There were no vitals filed for this visit.   Subjective Assessment - 08/14/21 1008     Subjective Pt arrives for today's treatment session reporting 7.5/10 B knee pain.  Pt ambulates into the facility without use of AD.    Limitations Walking;Standing    How long can you stand comfortably? pain with any standing or walking    How long can you walk comfortably? pain with any standing or walking    Patient Stated Goals sleep through the night (2 hours), reduced pain, reduced  fall risk    Currently in Pain? Yes    Pain Score 8    7.5   Pain Location Knee    Pain Orientation Right;Left    Pain Onset More than a month ago                               [facility] Adult PT Treatment/Exercise - 08/14/21 0001       Exercises   Exercises Knee/Hip      Knee/Hip Exercises: Aerobic   Nustep Lvl 3 x 15 mins      Knee/Hip Exercises: Seated   Long Arc Quad Both;Left;Right;20 reps;Weights    Long Arc Quad Weight 2 lbs.    Ball Squeeze 2 mins     Clamshell with TheraBand Red   2 mins   Marching Both;Right;Left;20 reps;Weights    Marching Weights 2 lbs.    Hamstring Curl Both;Strengthening   2 mins   Sit to [employer] 15 reps;without UE support      Modalities   Modalities Psychologist, educational Location B knee    Electrical Stimulation Action IFC 80-150 Hz    Electrical Stimulation Parameters 40% scan x 15 mins    Electrical Stimulation Goals Pain      Vasopneumatic   Number Minutes Vasopneumatic  15 minutes    Vasopnuematic Location  Knee    Vasopneumatic Pressure Low    Vasopneumatic Temperature  34                          PT Long Term Goals - 08/09/21 1308       PT LONG TERM GOAL #1   Title Patient will be independent with her HEP.    Time 4    Period Weeks    Status New    Target Date 09/06/21      PT LONG TERM GOAL #2   Title Patient will be able to improve her five time sit to stand time to 25 seconds or less for imporved lower extremity power.    Time 4    Period Weeks    Status New    Target Date 09/06/21      PT LONG TERM GOAL #3   Title Patient will report being able to sleep at least 4 hours without being awakened by her familiar knee pain.    Time 4    Period Weeks    Status New    Target Date 09/06/21      PT LONG TERM GOAL #4   Title Patient will be able to safely navigate at least 2 steps for improved safety getting into and out of her appartment.    Time 4    Period Weeks    Status New    Target Date 09/06/21                   Plan - 08/14/21 1009     Clinical Impression Statement Pt arrives for today's treatment session reporting 7.5/10 of B knee pain.  Pt introduce to NuStep for warm-up, ROM, endurance, and activity tolerance.  Pt instructed in numerous seated BLE exercises to increase strength, function, and safety.  Pt requiring min cues for proper technique, posture, and eccentric control.   Pt given printed HEP and tband to perform at home.  Pt introduced to estim and  vaso with normal responses noted upon removal.  Pt reported 0/10 B knee pain at completion of today's treatment session.    Personal Factors and Comorbidities Time since onset of injury/illness/exacerbation;Comorbidity 3+    Comorbidities HTN, OA, GAD    Examination-Activity Limitations Locomotion Level;Transfers;Bed Mobility;Carry;Squat;Stairs;Stand;Lift;Sleep    Examination-Participation Restrictions Cleaning;Community Activity;Shop    Stability/Clinical Decision Making Unstable/Unpredictable    Rehab Potential Good    PT Frequency 3x / week    PT Duration 4 weeks    PT Treatment/Interventions ADLs/Self Care Home Management;Cryotherapy;Electrical Stimulation;Moist Heat;Neuromuscular re-education;Balance training;Therapeutic exercise;Therapeutic activities;Functional mobility training;Stair training;Gait training;Patient/family education;Manual techniques;Passive range of motion;Taping;Vasopneumatic Device    PT Next Visit Plan nustep, lower extremity strengthening and balance interventions    Consulted and Agree with Plan of Care Patient             Patient will benefit from skilled therapeutic intervention in order to improve the following deficits and impairments:  Abnormal gait, Difficulty walking, Decreased activity tolerance, Pain, Decreased balance, Hypomobility, Decreased strength, Decreased mobility  Visit Diagnosis: History of falling  Muscle weakness (generalized)  Chronic pain of left knee  Chronic pain of right knee     Problem List Patient Active Problem List   Diagnosis Date Noted   Right flank pain 10/27/2020   Iron deficiency anemia due to chronic blood loss 09/15/2020   Symptomatic anemia 07/20/2020   History of peptic ulcer disease 07/20/2020   Depression, major, single episode, moderate ([facility]) 07/15/2020   Neck pain 04/28/2020   Vertigo 01/26/2020   GAD (generalized anxiety  disorder) 07/28/2019   Elevated blood-pressure reading, without diagnosis of hypertension 07/16/2019   Radiculopathy, cervical region 07/16/2019   S/P cervical spinal fusion 03/20/2019   Osteoarthritis 11/18/2017   Insomnia 11/18/2017   Synovitis, villonodular, knee, right 10/28/2017   Tear of lateral meniscus of right knee, current 10/28/2017   Chondromalacia patellae, 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   Language difficulty 09/25/2016   Nausea with vomiting 07/18/2016   PUD (peptic ulcer disease) 07/18/2016   Slurred speech    GI bleed 05/02/2016   Sinus tachycardia 05/02/2016   Acute blood loss anemia 05/02/2016   Speech disorder 03/31/2015   Thyroid nodule 12/14/2014   Left knee pain 12/14/2014   Seizures ([place])    Vitamin D deficiency 06/30/2014   Gastroesophageal reflux disease without esophagitis 06/29/2014   SYNCOPE 03/02/2010   HYPERTENSION, BENIGN 03/01/2010    Kathrynn Ducking, PTA 08/14/2021, 11:10 AM  [facility] [address] [place], [place], [address] Phone: [phone]   Fax:  [phone]  Name: Brittany Durham MRN: [id number] Date of Birth: 1970/10/16  PHYSICAL THERAPY DISCHARGE SUMMARY  Visits from Start of Care: 2  Current functional level related to goals / functional outcomes: Patient was unable to meet her goals for physical therapy in the two visits she attended. She did not complete her recommended POC.    Remaining deficits: Pain, weakness, and power deficits   Education / Equipment: HEP   Patient agrees to discharge. Patient goals were not met. Patient is being discharged due to not returning since the last visit.  Jacqulynn Cadet, PT, DPT

## 2021-08-17 ENCOUNTER — Other Ambulatory Visit: Payer: Self-pay | Admitting: Family

## 2021-08-17 DIAGNOSIS — K279 Peptic ulcer, site unspecified, unspecified as acute or chronic, without hemorrhage or perforation: Secondary | ICD-10-CM

## 2021-08-24 ENCOUNTER — Inpatient Hospital Stay (HOSPITAL_COMMUNITY): Payer: Medicare Other | Attending: Hematology

## 2021-08-24 DIAGNOSIS — D500 Iron deficiency anemia secondary to blood loss (chronic): Secondary | ICD-10-CM

## 2021-08-24 DIAGNOSIS — D509 Iron deficiency anemia, unspecified: Secondary | ICD-10-CM | POA: Diagnosis not present

## 2021-08-24 LAB — CBC WITH DIFFERENTIAL/PLATELET
Abs Immature Granulocytes: 0.03 10*3/uL (ref 0.00–0.07)
Basophils Absolute: 0 10*3/uL (ref 0.0–0.1)
Basophils Relative: 1 %
Eosinophils Absolute: 0.1 10*3/uL (ref 0.0–0.5)
Eosinophils Relative: 2 %
HCT: 33.4 % — ABNORMAL LOW (ref 36.0–46.0)
Hemoglobin: 10.8 g/dL — ABNORMAL LOW (ref 12.0–15.0)
Immature Granulocytes: 1 %
Lymphocytes Relative: 29 %
Lymphs Abs: 1.8 10*3/uL (ref 0.7–4.0)
MCH: 30.4 pg (ref 26.0–34.0)
MCHC: 32.3 g/dL (ref 30.0–36.0)
MCV: 94.1 fL (ref 80.0–100.0)
Monocytes Absolute: 0.4 10*3/uL (ref 0.1–1.0)
Monocytes Relative: 6 %
Neutro Abs: 3.8 10*3/uL (ref 1.7–7.7)
Neutrophils Relative %: 61 %
Platelets: 255 10*3/uL (ref 150–400)
RBC: 3.55 MIL/uL — ABNORMAL LOW (ref 3.87–5.11)
RDW: 13.8 % (ref 11.5–15.5)
WBC: 6.2 10*3/uL (ref 4.0–10.5)
nRBC: 0 % (ref 0.0–0.2)

## 2021-08-24 LAB — IRON AND TIBC
Iron: 44 ug/dL (ref 28–170)
Saturation Ratios: 10 % — ABNORMAL LOW (ref 10.4–31.8)
TIBC: 435 ug/dL (ref 250–450)
UIBC: 391 ug/dL

## 2021-08-24 LAB — FERRITIN: Ferritin: 21 ng/mL (ref 11–307)

## 2021-08-28 DIAGNOSIS — Z20822 Contact with and (suspected) exposure to covid-19: Secondary | ICD-10-CM | POA: Diagnosis not present

## 2021-08-30 NOTE — Progress Notes (Signed)
? ?[facility] ?[address] ?[place], [place] [address] ? ? ?CLINIC:  ?Medical Oncology/Hematology ? ?PCP:  ?Sharion Balloon, FNP ?[address] ?[place] [address] ?[phone] ? ? ?REASON FOR VISIT:  ?Follow-up for iron deficiency anemia ?  ?CURRENT THERAPY: Intermittent IV iron (last Venofer 05/19/2021) ?  ?INTERVAL HISTORY:  ?Brittany Durham 51 y.o. female returns for routine follow-up of her iron deficiency anemia.  She was last seen by Tarri Abernethy PA-C on 04/28/2021. ?  ?At today's visit, she reports feeling fair apart from fatigue and knee pain.   ?She denies any major bleeding events such as hematemesis, hematochezia, melena, or epistaxis. She reports that her stool tends to be dark due to the iron supplementation.  She has not had any further vaginal bleeding since her isolated episode several months ago. ?She does feel fatigued, with energy about 60%.  She attributes this in part to arthritic pain in her knees. She  denies any shortness of breath with exertion.  She denies any pica, restless legs, headaches, chest pain, lightheadedness, syncope. ?She has 100% appetite. She endorses that she is maintaining a stable weight. ? ? ?REVIEW OF SYSTEMS:  ?Review of Systems  ?Constitutional:  Positive for fatigue. Negative for appetite change, chills, diaphoresis, fever and unexpected weight change.  ?HENT:   Negative for lump/mass and nosebleeds.   ?Eyes:  Negative for eye problems.  ?Respiratory:  Negative for cough, hemoptysis and shortness of breath.   ?Cardiovascular:  Negative for chest pain, leg swelling and palpitations.  ?Gastrointestinal:  Negative for abdominal pain, blood in stool, constipation, diarrhea, nausea and vomiting.  ?Genitourinary:  Negative for hematuria.   ?Musculoskeletal:  Positive for arthralgias.  ?Skin: Negative.   ?Neurological:  Negative for dizziness, headaches and light-headedness.  ?Hematological:  Does not bruise/bleed easily.  ?Psychiatric/Behavioral:   Positive for sleep disturbance.    ? ? ?PAST MEDICAL/SURGICAL HISTORY:  ?Past Medical History:  ?Diagnosis Date  ? Anemia   ? Asthma   ? childhood  ? Carotidynia   ? COPD (chronic obstructive pulmonary disease) ([place])   ? DDD (degenerative disc disease), cervical   ? GERD (gastroesophageal reflux disease)   ? Hypertension   ? Knee pain   ? left meniscus tear s/p repair 11/2014  ? Seizures ([place])   ? last seizure was over 10 years ago, unknown etiology  ? Stuttering   ? patient reports this started after her knee surgery in 11/2014.   ? Thyroid nodule   ? ?Past Surgical History:  ?Procedure Laterality Date  ? ANTERIOR CERVICAL DECOMP/DISCECTOMY FUSION N/A 03/20/2019  ? Procedure: Anterior Cervical Decompression Fusion - Cervical Four-Cervical Five - Cervical Five-Cervical Six;  Surgeon: Eustace Moore, MD;  Location: [facility];  Service: Neurosurgery;  Laterality: N/A;  Anterior Cervical Decompression Fusion - Cervical Four-Cervical Five - Cervical Five-Cervical Six  ? BIOPSY  09/20/2020  ? Procedure: BIOPSY;  Surgeon: Daneil Dolin, MD;  [facility];  Service: Endoscopy;;  ? COLONOSCOPY N/A 08/23/2016  ? Procedure: COLONOSCOPY;  Surgeon: Daneil Dolin, MD;  [facility];  Service: Endoscopy;  Laterality: N/A;  1:00pm  ? COLONOSCOPY N/A 08/24/2020  ? Diverticulosis in the entire examined colon. Redundant colon. Exam otherwise normal  ? ESOPHAGOGASTRODUODENOSCOPY N/A 05/03/2016  ? Procedure: ESOPHAGOGASTRODUODENOSCOPY (EGD);  Surgeon: Daneil Dolin, MD;  [facility];  Service: Endoscopy;  Laterality: N/A;  ? ESOPHAGOGASTRODUODENOSCOPY N/A 08/23/2016  ? Procedure: ESOPHAGOGASTRODUODENOSCOPY (EGD);  Surgeon: Cristopher Estimable  Rourk, MD;  [facility];  Service: Endoscopy;  Laterality: N/A;  ? ESOPHAGOGASTRODUODENOSCOPY (EGD) WITH PROPOFOL N/A 09/20/2020  ? normal esophagus. Large hiatal hernia. Multiple gastric erosions (Cameron lesions). Normal duodenal bulb and second portion of the  duodenum. Suspicion Lysbeth Galas lesions causing intermittent bleeding  ? KNEE ARTHROSCOPY Right   ? KNEE ARTHROSCOPY WITH MEDIAL MENISECTOMY Left 12/09/2014  ? Procedure: KNEE ARTHROSCOPY WITH PARTIAL MEDIAL AND LATERAL MENISECTOMY;  Surgeon: Sanjuana Kava, MD;  [facility];  Service: Orthopedics;  Laterality: Left;  ? TUBAL LIGATION    ? WISDOM TOOTH EXTRACTION    ? ? ? ?SOCIAL HISTORY:  ?Social History  ? ?Socioeconomic History  ? Marital status: Single  ?  Spouse name: Not on file  ? Number of children: 1  ? Years of education: 54  ? Highest education level: Not on file  ?Occupational History  ? Occupation: Umemployed  ?  Comment: Disability  ?Tobacco Use  ? Smoking status: Never  ? Smokeless tobacco: Never  ?Vaping Use  ? Vaping Use: Never used  ?Substance and Sexual Activity  ? Alcohol use: No  ? Drug use: No  ? Sexual activity: Not Currently  ?  Birth control/protection: Surgical  ?Other Topics Concern  ? Not on file  ?Social History Narrative  ? Lives at home alone.  ? Disabled - speech difficulties, cannot drive  ? Right-handed.  ? Occasional caffeine use.  ? Her daughter lives about 30-45 minutes away  ? She has lots of family nearby - uncle, mother, sister, brother  ? ?Social Determinants of Health  ? ?Financial Resource Strain: Low Risk   ? Difficulty of Paying Living Expenses: Not hard at all  ?Food Insecurity: No Food Insecurity  ? Worried About Charity fundraiser in the Last Year: Never true  ? Ran Out of Food in the Last Year: Never true  ?Transportation Needs: No Transportation Needs  ? Lack of Transportation (Medical): No  ? Lack of Transportation (Non-Medical): No  ?Physical Activity: Insufficiently Active  ? Days of Exercise per Week: 2 days  ? Minutes of Exercise per Session: 30 min  ?Stress: No Stress Concern Present  ? Feeling of Stress : Not at all  ?Social Connections: Moderately Integrated  ? Frequency of Communication with Friends and Family: More than three times a week  ? Frequency of  Social Gatherings with Friends and Family: More than three times a week  ? Attends Religious Services: More than 4 times per year  ? Active Member of Clubs or Organizations: Yes  ? Attends Archivist Meetings: More than 4 times per year  ? Marital Status: Never married  ?Intimate Partner Violence: Not At Risk  ? Fear of Current or Ex-Partner: No  ? Emotionally Abused: No  ? Physically Abused: No  ? Sexually Abused: No  ? ? ?FAMILY HISTORY:  ?Family History  ?Problem Relation Age of Onset  ? Hypertension Mother   ? Lung cancer Father   ? Hypertension Brother   ? Colon cancer Neg Hx   ? Gastric cancer Neg Hx   ? Esophageal cancer Neg Hx   ? ? ?CURRENT MEDICATIONS:  ?Outpatient Encounter Medications as of 08/31/2021  ?Medication Sig Note  ? amLODipine (NORVASC) 10 MG tablet TAKE 1 TABLET ONCE DAILY   ? busPIRone (BUSPAR) 5 MG tablet TAKE (1) TABLET UP TO THREE TIMES DAILY.   ? Cholecalciferol (D 2000) 50 MCG (2000 UT) TABS Take by mouth daily.   ?  diclofenac (VOLTAREN) 75 MG EC tablet Take 1 tablet (75 mg total) by mouth 2 (two) times daily.   ? diclofenac Sodium (VOLTAREN) 1 % GEL Apply 2 g topically 4 (four) times daily.   ? dicyclomine (BENTYL) 10 MG capsule Take 1 capsule (10 mg total) by mouth 4 (four) times daily -  before meals and at bedtime. For cramping and loose stool. Stop if constipation.   ? docusate sodium (COLACE) 100 MG capsule Take 100 mg by mouth daily.   ? escitalopram (LEXAPRO) 20 MG tablet TAKE 1 TABLET DAILY.   ? ferrous sulfate 325 (65 FE) MG tablet Take 1 tablet by mouth daily.   ? lisinopril (ZESTRIL) 20 MG tablet TAKE 1 TABLET DAILY   ? meclizine (ANTIVERT) 25 MG tablet Take 1 tablet (25 mg total) by mouth 3 (three) times daily as needed for dizziness.   ? methocarbamol (ROBAXIN) 500 MG tablet Take by mouth every 8 (eight) hours as needed.   ? pantoprazole (PROTONIX) 40 MG tablet TAKE 1 TABLET ONCE DAILY BEFORE BREAKFAST   ? potassium chloride SA (KLOR-CON) 20 MEQ tablet Take 1  tablet (20 mEq total) by mouth 2 (two) times daily.   ? Vitamin D, Ergocalciferol, (DRISDOL) 1.25 MG (50000 UNIT) CAPS capsule Take 50,000 Units by mouth once a week. 08/18/2020: Wednesdays   ? ?No facility-administ

## 2021-08-31 ENCOUNTER — Inpatient Hospital Stay (HOSPITAL_BASED_OUTPATIENT_CLINIC_OR_DEPARTMENT_OTHER): Payer: Medicare Other | Admitting: Physician Assistant

## 2021-08-31 VITALS — BP 126/75 | HR 93 | Temp 98.1°F | Resp 18 | Ht 66.14 in | Wt 215.6 lb

## 2021-08-31 DIAGNOSIS — D509 Iron deficiency anemia, unspecified: Secondary | ICD-10-CM | POA: Diagnosis not present

## 2021-08-31 DIAGNOSIS — D500 Iron deficiency anemia secondary to blood loss (chronic): Secondary | ICD-10-CM | POA: Diagnosis not present

## 2021-08-31 NOTE — Patient Instructions (Signed)
[place] at [facility] ?Discharge Instructions ? ?You were seen today by Tarri Abernethy PA-C for your iron deficiency anemia. ? ?Your blood and iron levels remain low.  We recommend additional IV iron (Venofer x 3 doses).  We will schedule you for this in the upcoming weeks.  We will see you for follow-up in 3 months. ? ?LABS: Return in 3 months for repeat labs ? ?MEDICATIONS: No changes to home medications ? ?FOLLOW-UP APPOINTMENT: Office visit in 3 months ? ? ?Thank you for choosing [place] at [facility] to provide your oncology and hematology care.  To afford each patient quality time with our provider, please arrive at least 15 minutes before your scheduled appointment time.  ? ?If you have a lab appointment with the [facility] please come in thru the Main Entrance and check in at the main information desk. ? ?You need to re-schedule your appointment should you arrive 10 or more minutes late.  We strive to give you quality time with our providers, and arriving late affects you and other patients whose appointments are after yours.  Also, if you no show three or more times for appointments you may be dismissed from the clinic at the providers discretion.     ?Again, thank you for choosing [facility].  Our hope is that these requests will decrease the amount of time that you wait before being seen by our physicians.       ?_____________________________________________________________ ? ?Should you have questions after your visit to [facility], please contact our office at [phone] and follow the prompts.  Our office hours are 8:00 a.m. and 4:30 p.m. Monday - Friday.  Please note that voicemails left after 4:00 p.m. may not be returned until the following business day.  We are closed weekends and major holidays.  You do have access to a nurse 24-7, just call the main number to the clinic [phone] and do not press any  options, hold on the line and a nurse will answer the phone.   ? ?For prescription refill requests, have your pharmacy contact our office and allow 72 hours.   ? ?Due to Covid, you will need to wear a mask upon entering the hospital. If you do not have a mask, a mask will be given to you at the Main Entrance upon arrival. For doctor visits, patients may have 1 support person age 74 or older with them. For treatment visits, patients can not have anyone with them due to social distancing guidelines and our immunocompromised population.  ? ? ? ?

## 2021-09-15 ENCOUNTER — Inpatient Hospital Stay (HOSPITAL_COMMUNITY): Payer: Medicare Other | Attending: Hematology

## 2021-09-15 VITALS — BP 118/76 | HR 86 | Temp 97.7°F | Resp 18

## 2021-09-15 DIAGNOSIS — D509 Iron deficiency anemia, unspecified: Secondary | ICD-10-CM | POA: Diagnosis not present

## 2021-09-15 DIAGNOSIS — D62 Acute posthemorrhagic anemia: Secondary | ICD-10-CM

## 2021-09-15 DIAGNOSIS — D500 Iron deficiency anemia secondary to blood loss (chronic): Secondary | ICD-10-CM

## 2021-09-15 MED ORDER — LORATADINE 10 MG PO TABS
10.0000 mg | ORAL_TABLET | Freq: Once | ORAL | Status: AC
  Administered 2021-09-15: 10 mg via ORAL
  Filled 2021-09-15: qty 1

## 2021-09-15 MED ORDER — SODIUM CHLORIDE 0.9 % IV SOLN
300.0000 mg | Freq: Once | INTRAVENOUS | Status: AC
  Administered 2021-09-15: 300 mg via INTRAVENOUS
  Filled 2021-09-15: qty 300

## 2021-09-15 MED ORDER — ACETAMINOPHEN 325 MG PO TABS
650.0000 mg | ORAL_TABLET | Freq: Once | ORAL | Status: AC
  Administered 2021-09-15: 650 mg via ORAL
  Filled 2021-09-15: qty 2

## 2021-09-15 MED ORDER — SODIUM CHLORIDE 0.9 % IV SOLN: Freq: Once | INTRAVENOUS | Status: AC

## 2021-09-15 NOTE — Patient Instructions (Signed)
[facility]  Discharge Instructions: Thank you for choosing [facility] to provide your oncology and hematology care.  If you have a lab appointment with the Cancer Center, please come in thru the Main Entrance and check in at the main information desk.  Wear comfortable clothing and clothing appropriate for easy access to any Portacath or PICC line.   We strive to give you quality time with your provider. You may need to reschedule your appointment if you arrive late (15 or more minutes).  Arriving late affects you and other patients whose appointments are after yours.  Also, if you miss three or more appointments without notifying the office, you may be dismissed from the clinic at the provider's discretion.      For prescription refill requests, have your pharmacy contact our office and allow 72 hours for refills to be completed.        To help prevent nausea and vomiting after your treatment, we encourage you to take your nausea medication as directed.  BELOW ARE SYMPTOMS THAT SHOULD BE REPORTED IMMEDIATELY: *FEVER GREATER THAN 100.4 F (38 C) OR HIGHER *CHILLS OR SWEATING *NAUSEA AND VOMITING THAT IS NOT CONTROLLED WITH YOUR NAUSEA MEDICATION *UNUSUAL SHORTNESS OF BREATH *UNUSUAL BRUISING OR BLEEDING *URINARY PROBLEMS (pain or burning when urinating, or frequent urination) *BOWEL PROBLEMS (unusual diarrhea, constipation, pain near the anus) TENDERNESS IN MOUTH AND THROAT WITH OR WITHOUT PRESENCE OF ULCERS (sore throat, sores in mouth, or a toothache) UNUSUAL RASH, SWELLING OR PAIN  UNUSUAL VAGINAL DISCHARGE OR ITCHING   Items with * indicate a potential emergency and should be followed up as soon as possible or go to the Emergency Department if any problems should occur.  Please show the CHEMOTHERAPY ALERT CARD or IMMUNOTHERAPY ALERT CARD at check-in to the Emergency Department and triage nurse.  Should you have questions after your visit or need to cancel  or reschedule your appointment, please contact [facility] [phone]  and follow the prompts.  Office hours are 8:00 a.m. to 4:30 p.m. Monday - Friday. Please note that voicemails left after 4:00 p.m. may not be returned until the following business day.  We are closed weekends and major holidays. You have access to a nurse at all times for urgent questions. Please call the main number to the clinic [phone] and follow the prompts.  For any non-urgent questions, you may also contact your provider using MyChart. We now offer e-Visits for anyone 18 and older to request care online for non-urgent symptoms. For details visit [email or web address].   Also download the MyChart app! Go to the app store, search "MyChart", open the app, select [place], and log in with your MyChart username and password.  Due to Covid, a mask is required upon entering the hospital/clinic. If you do not have a mask, one will be given to you upon arrival. For doctor visits, patients may have 1 support person aged 18 or older with them. For treatment visits, patients cannot have anyone with them due to current Covid guidelines and our immunocompromised population.  

## 2021-09-15 NOTE — Progress Notes (Signed)
Patient presents today for iron infusion.  Patient is in satisfactory condition with no complaints voiced.  Vital signs are stable.  We will proceed with treatment per MD orders.  Patient tolerated treatment well with no complaints voiced.  Patient left ambulatory in stable condition.  Vital signs stable at discharge.  Follow up as scheduled.    

## 2021-09-22 ENCOUNTER — Inpatient Hospital Stay (HOSPITAL_COMMUNITY): Payer: Medicare Other

## 2021-09-22 VITALS — BP 98/84 | HR 90 | Temp 97.2°F | Resp 18

## 2021-09-22 DIAGNOSIS — D509 Iron deficiency anemia, unspecified: Secondary | ICD-10-CM | POA: Diagnosis not present

## 2021-09-22 DIAGNOSIS — D62 Acute posthemorrhagic anemia: Secondary | ICD-10-CM

## 2021-09-22 DIAGNOSIS — D500 Iron deficiency anemia secondary to blood loss (chronic): Secondary | ICD-10-CM

## 2021-09-22 MED ORDER — SODIUM CHLORIDE 0.9 % IV SOLN
300.0000 mg | Freq: Once | INTRAVENOUS | Status: AC
  Administered 2021-09-22: 300 mg via INTRAVENOUS
  Filled 2021-09-22: qty 300

## 2021-09-22 MED ORDER — ACETAMINOPHEN 325 MG PO TABS
650.0000 mg | ORAL_TABLET | Freq: Once | ORAL | Status: AC
  Administered 2021-09-22: 650 mg via ORAL
  Filled 2021-09-22: qty 2

## 2021-09-22 MED ORDER — LORATADINE 10 MG PO TABS
10.0000 mg | ORAL_TABLET | Freq: Once | ORAL | Status: AC
  Administered 2021-09-22: 10 mg via ORAL
  Filled 2021-09-22: qty 1

## 2021-09-22 MED ORDER — SODIUM CHLORIDE 0.9 % IV SOLN: Freq: Once | INTRAVENOUS | Status: AC

## 2021-09-22 NOTE — Progress Notes (Signed)
Patient presents today for iron infusion.  Patient is in satisfactory condition with no complaints voiced.  Vital signs are stable.  We will proceed with infusion per MD orders.   Patient tolerated treatment well with no complaints voiced.  Patient left ambulatory in stable condition.  Vital signs stable at discharge.  Follow up as scheduled.    

## 2021-09-22 NOTE — Patient Instructions (Signed)
[facility]  Discharge Instructions: Thank you for choosing [facility] to provide your oncology and hematology care.  If you have a lab appointment with the Cancer Center, please come in thru the Main Entrance and check in at the main information desk.  Wear comfortable clothing and clothing appropriate for easy access to any Portacath or PICC line.   We strive to give you quality time with your provider. You may need to reschedule your appointment if you arrive late (15 or more minutes).  Arriving late affects you and other patients whose appointments are after yours.  Also, if you miss three or more appointments without notifying the office, you may be dismissed from the clinic at the provider's discretion.      For prescription refill requests, have your pharmacy contact our office and allow 72 hours for refills to be completed.        To help prevent nausea and vomiting after your treatment, we encourage you to take your nausea medication as directed.  BELOW ARE SYMPTOMS THAT SHOULD BE REPORTED IMMEDIATELY: *FEVER GREATER THAN 100.4 F (38 C) OR HIGHER *CHILLS OR SWEATING *NAUSEA AND VOMITING THAT IS NOT CONTROLLED WITH YOUR NAUSEA MEDICATION *UNUSUAL SHORTNESS OF BREATH *UNUSUAL BRUISING OR BLEEDING *URINARY PROBLEMS (pain or burning when urinating, or frequent urination) *BOWEL PROBLEMS (unusual diarrhea, constipation, pain near the anus) TENDERNESS IN MOUTH AND THROAT WITH OR WITHOUT PRESENCE OF ULCERS (sore throat, sores in mouth, or a toothache) UNUSUAL RASH, SWELLING OR PAIN  UNUSUAL VAGINAL DISCHARGE OR ITCHING   Items with * indicate a potential emergency and should be followed up as soon as possible or go to the Emergency Department if any problems should occur.  Please show the CHEMOTHERAPY ALERT CARD or IMMUNOTHERAPY ALERT CARD at check-in to the Emergency Department and triage nurse.  Should you have questions after your visit or need to cancel  or reschedule your appointment, please contact [facility] [phone]  and follow the prompts.  Office hours are 8:00 a.m. to 4:30 p.m. Monday - Friday. Please note that voicemails left after 4:00 p.m. may not be returned until the following business day.  We are closed weekends and major holidays. You have access to a nurse at all times for urgent questions. Please call the main number to the clinic [phone] and follow the prompts.  For any non-urgent questions, you may also contact your provider using MyChart. We now offer e-Visits for anyone 18 and older to request care online for non-urgent symptoms. For details visit [email or web address].   Also download the MyChart app! Go to the app store, search "MyChart", open the app, select [place], and log in with your MyChart username and password.  Due to Covid, a mask is required upon entering the hospital/clinic. If you do not have a mask, one will be given to you upon arrival. For doctor visits, patients may have 1 support person aged 18 or older with them. For treatment visits, patients cannot have anyone with them due to current Covid guidelines and our immunocompromised population.  

## 2021-09-29 ENCOUNTER — Encounter (HOSPITAL_COMMUNITY): Payer: Self-pay

## 2021-09-29 ENCOUNTER — Inpatient Hospital Stay (HOSPITAL_COMMUNITY): Payer: Medicare Other

## 2021-09-29 VITALS — BP 140/88 | HR 88 | Temp 97.0°F | Resp 18

## 2021-09-29 DIAGNOSIS — D500 Iron deficiency anemia secondary to blood loss (chronic): Secondary | ICD-10-CM

## 2021-09-29 DIAGNOSIS — D509 Iron deficiency anemia, unspecified: Secondary | ICD-10-CM | POA: Diagnosis not present

## 2021-09-29 DIAGNOSIS — D62 Acute posthemorrhagic anemia: Secondary | ICD-10-CM

## 2021-09-29 MED ORDER — ACETAMINOPHEN 325 MG PO TABS
650.0000 mg | ORAL_TABLET | Freq: Once | ORAL | Status: AC
  Administered 2021-09-29: 650 mg via ORAL
  Filled 2021-09-29: qty 2

## 2021-09-29 MED ORDER — SODIUM CHLORIDE 0.9 % IV SOLN
400.0000 mg | Freq: Once | INTRAVENOUS | Status: AC
  Administered 2021-09-29: 400 mg via INTRAVENOUS
  Filled 2021-09-29: qty 20

## 2021-09-29 MED ORDER — SODIUM CHLORIDE 0.9 % IV SOLN: Freq: Once | INTRAVENOUS | Status: AC

## 2021-09-29 MED ORDER — LORATADINE 10 MG PO TABS
10.0000 mg | ORAL_TABLET | Freq: Once | ORAL | Status: AC
  Administered 2021-09-29: 10 mg via ORAL
  Filled 2021-09-29: qty 1

## 2021-09-29 NOTE — Patient Instructions (Addendum)
[facility]  Discharge Instructions: Thank you for choosing [place] to provide your oncology and hematology care.  If you have a lab appointment with the [facility], please come in thru the Main Entrance and check in at the main information desk.  Wear comfortable clothing and clothing appropriate for easy access to any Portacath or PICC line.   We strive to give you quality time with your provider. You may need to reschedule your appointment if you arrive late (15 or more minutes).  Arriving late affects you and other patients whose appointments are after yours.  Also, if you miss three or more appointments without notifying the office, you may be dismissed from the clinic at the provider's discretion.      For prescription refill requests, have your pharmacy contact our office and allow 72 hours for refills to be completed.    Today you received the following chemotherapy and/or immunotherapy agents Venofer 300 mg       To help prevent nausea and vomiting after your treatment, we encourage you to take your nausea medication as directed.  BELOW ARE SYMPTOMS THAT SHOULD BE REPORTED IMMEDIATELY: *FEVER GREATER THAN 100.4 F (38 C) OR HIGHER *CHILLS OR SWEATING *NAUSEA AND VOMITING THAT IS NOT CONTROLLED WITH YOUR NAUSEA MEDICATION *UNUSUAL SHORTNESS OF BREATH *UNUSUAL BRUISING OR BLEEDING *URINARY PROBLEMS (pain or burning when urinating, or frequent urination) *BOWEL PROBLEMS (unusual diarrhea, constipation, pain near the anus) TENDERNESS IN MOUTH AND THROAT WITH OR WITHOUT PRESENCE OF ULCERS (sore throat, sores in mouth, or a toothache) UNUSUAL RASH, SWELLING OR PAIN  UNUSUAL VAGINAL DISCHARGE OR ITCHING   Items with * indicate a potential emergency and should be followed up as soon as possible or go to the Emergency Department if any problems should occur.  Please show the CHEMOTHERAPY ALERT CARD or IMMUNOTHERAPY ALERT CARD at check-in to the Emergency  Department and triage nurse.  Should you have questions after your visit or need to cancel or reschedule your appointment, please contact [facility] [phone]  and follow the prompts.  Office hours are 8:00 a.m. to 4:30 p.m. Monday - Friday. Please note that voicemails left after 4:00 p.m. may not be returned until the following business day.  We are closed weekends and major holidays. You have access to a nurse at all times for urgent questions. Please call the main number to the clinic [phone] and follow the prompts.  For any non-urgent questions, you may also contact your provider using MyChart. We now offer e-Visits for anyone 46 and older to request care online for non-urgent symptoms. For details visit [email or web address].   Also download the MyChart app! Go to the app store, search "MyChart", open the app, select [name], and log in with your MyChart username and password.  Due to Covid, a mask is required upon entering the hospital/clinic. If you do not have a mask, one will be given to you upon arrival. For doctor visits, patients may have 1 support person aged 75 or older with them. For treatment visits, patients cannot have anyone with them due to current Covid guidelines and our immunocompromised population.

## 2021-09-29 NOTE — Progress Notes (Signed)
Venofer 400 mg given today per MD orders. Tolerated infusion without adverse affects. Vital signs stable. No complaints at this time. Discharged from clinic ambulatory in stable condition. Alert and oriented x 3. F/U with [facility] as scheduled.  

## 2021-10-02 DIAGNOSIS — T7840XA Allergy, unspecified, initial encounter: Secondary | ICD-10-CM | POA: Diagnosis not present

## 2021-10-17 IMAGING — CT CT ABD-PEL WO/W CM
2 of 9 series · 10 of 46 positions shown, 16 images · IV contrast (omnipaque)
Comparison: CT the abdomen and pelvis 10/25/2020.

CLINICAL DATA: 49-year-old female with history of right-sided flank
pain since 10/02/2020. Dark diarrhea. Suspected diverticulitis.

EXAM:
CT ABDOMEN AND PELVIS WITHOUT AND WITH CONTRAST
TECHNIQUE: Multidetector CT imaging of the abdomen and pelvis was performed
following the standard protocol before and following the bolus
administration of intravenous contrast.
CONTRAST:  25mL OMNIPAQUE IOHEXOL 300 MG/ML SOLN, 25mL OMNIPAQUE
IOHEXOL 300 MG/ML SOLN, 25mL OMNIPAQUE IOHEXOL 300 MG/ML SOLN, 25mL
OMNIPAQUE IOHEXOL 300 MG/ML SOLN

[Series 3: axial st · axial · 0.83mm/px · z∈[+883,+1263]mm · 7 of 102 slices shown, 12 images]
[im 13/102  soft-tissue]
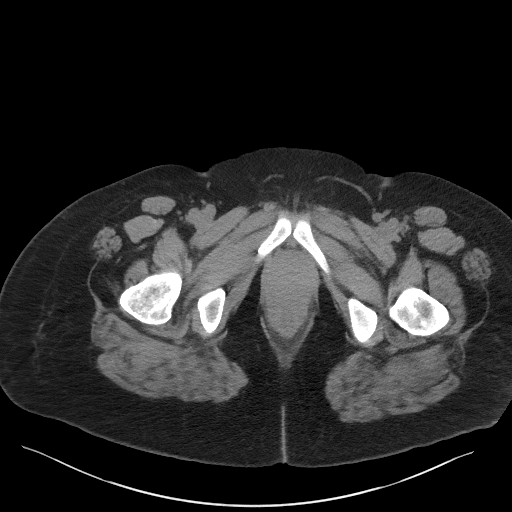
[im 13/102  bone]
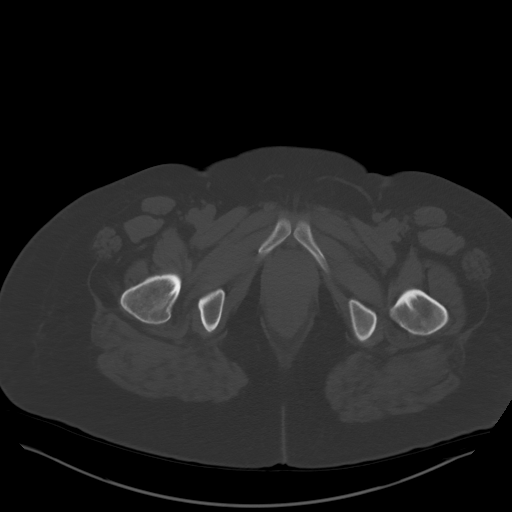
[im 26/102  soft-tissue]
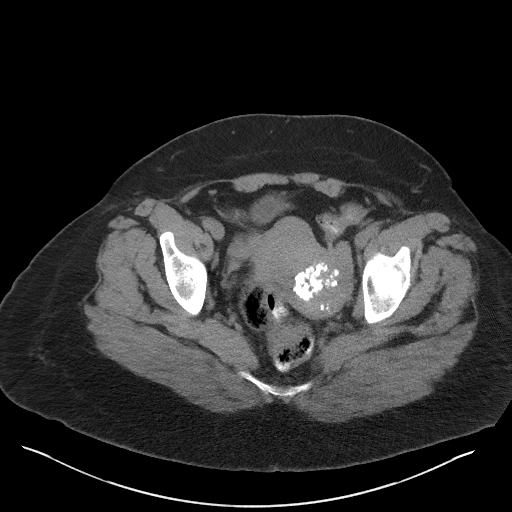
[im 38/102  soft-tissue]
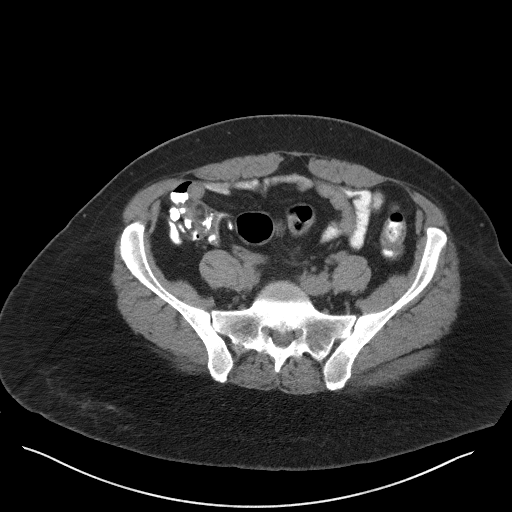
[im 51/102  soft-tissue]
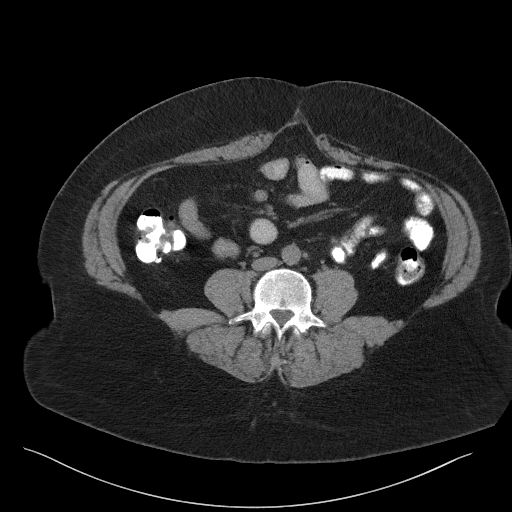
[im 51/102  lung]
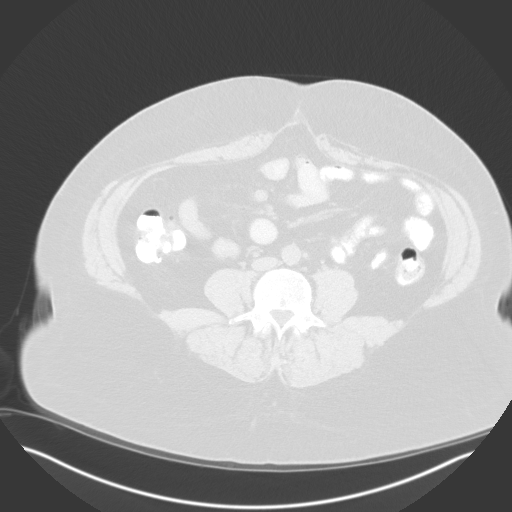
[im 64/102  soft-tissue]
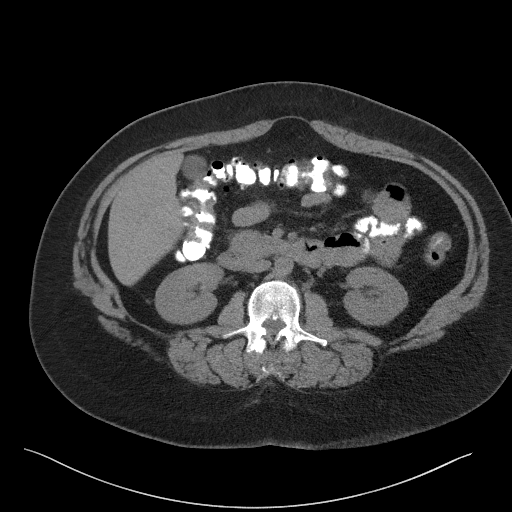
[im 64/102  lung]
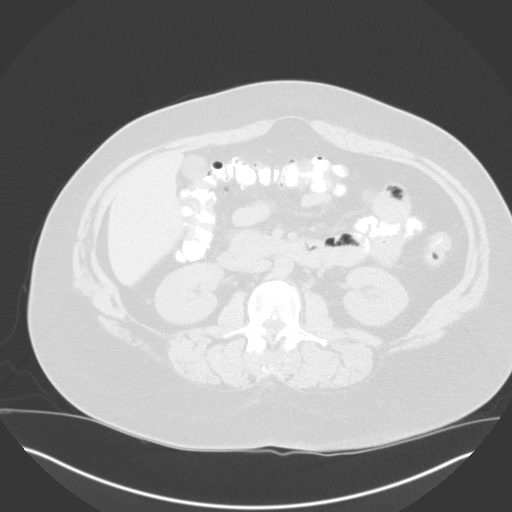
[im 76/102  soft-tissue]
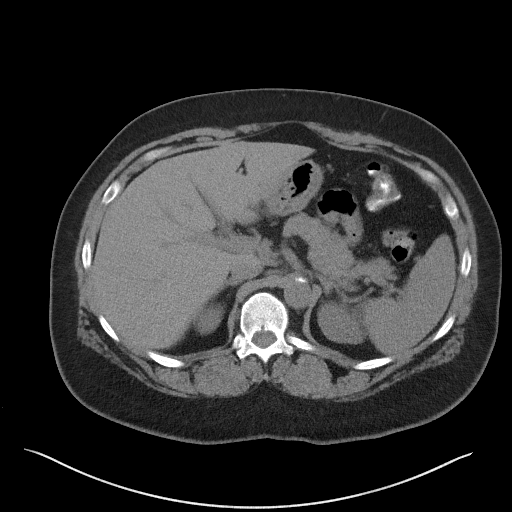
[im 76/102  lung]
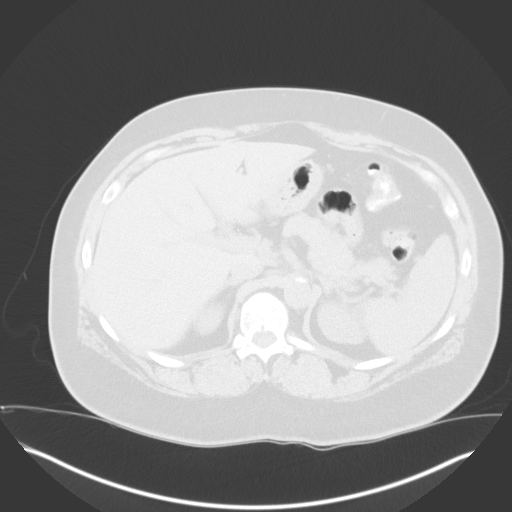
[im 89/102  soft-tissue]
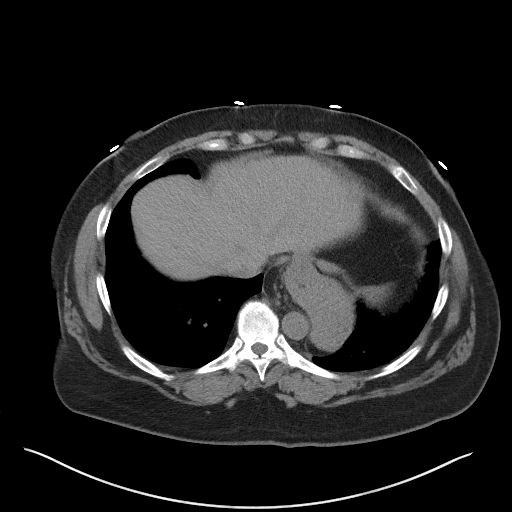
[im 89/102  lung]
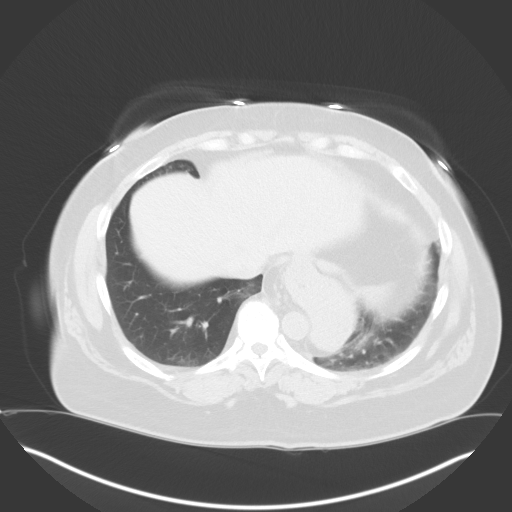

[Series 4: coronal st · coronal · 0.85mm/px · 3 of 101 slices shown, 4 images]
[im 26/101  soft-tissue]
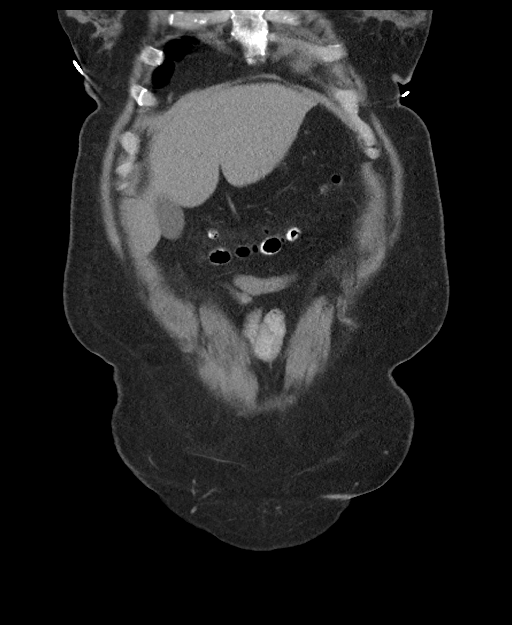
[im 51/101  soft-tissue]
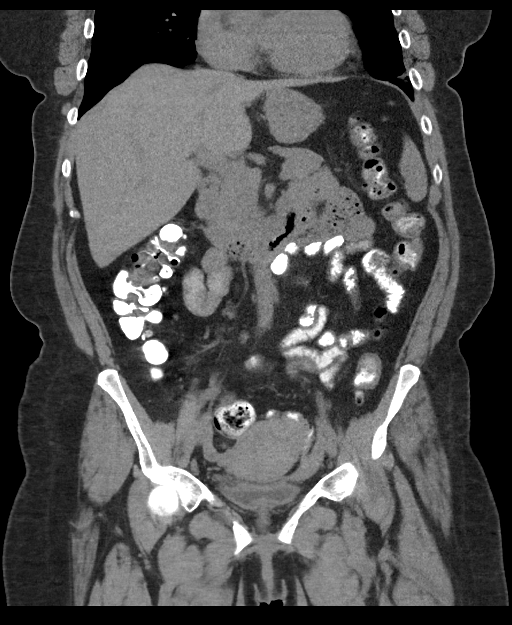
[im 51/101  bone]
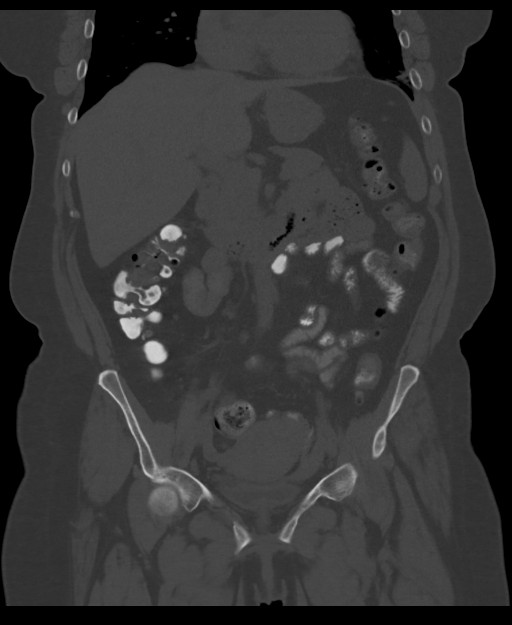
[im 76/101  soft-tissue]
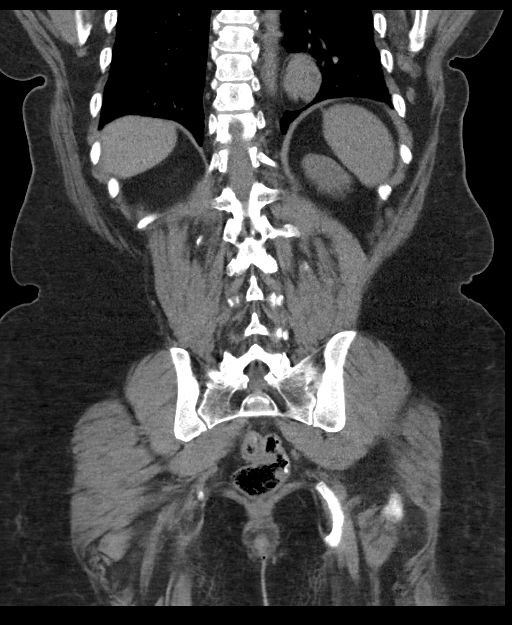

[10 of 46 positions shown; findings below may reference images not displayed]

FINDINGS: Lower chest: Large hiatal hernia.

Hepatobiliary: No suspicious cystic or solid hepatic lesions. No
intra or extrahepatic biliary ductal dilatation. Gallbladder is
normal in appearance.

Pancreas: No pancreatic mass. No pancreatic ductal dilatation. No
pancreatic or peripancreatic fluid collections or inflammatory
changes.

Spleen: Unremarkable.

Adrenals/Urinary Tract: Bilateral kidneys and adrenal glands are
normal in appearance. No hydroureteronephrosis. Urinary bladder is
normal in appearance.

Stomach/Bowel: Normal appearance of the intra-abdominal portion of
the stomach. No pathologic dilatation of small bowel or colon.
Numerous colonic diverticulae are noted, without surrounding
inflammatory changes to suggest an acute diverticulitis at this
time. Normal appendix.

Vascular/Lymphatic: Aortic atherosclerosis. No lymphadenopathy noted
in the abdomen or pelvis.

Reproductive: Uterus is enlarged and heterogeneous in appearance
with multiple lesions, largest of which is densely calcified
measuring at least 4.5 cm in diameter in the posterolateral aspect
of the uterine body on the left side, presumably a fibroid. Ovaries
are unremarkable in appearance.

Other: No significant volume of ascites.  No pneumoperitoneum.

Musculoskeletal: There are no aggressive appearing lytic or blastic
lesions noted in the visualized portions of the skeleton.
IMPRESSION: 1. No acute findings are noted in the abdomen or pelvis to account
for the patient's symptoms.
2. Colonic diverticulosis without evidence of acute diverticulitis
at this time.
3. Fibroid uterus.
4. Large hiatal hernia.
5. Aortic atherosclerosis.

## 2021-10-20 ENCOUNTER — Other Ambulatory Visit: Payer: Self-pay | Admitting: Family

## 2021-10-20 DIAGNOSIS — I10 Essential (primary) hypertension: Secondary | ICD-10-CM

## 2021-11-25 DIAGNOSIS — M109 Gout, unspecified: Secondary | ICD-10-CM | POA: Diagnosis not present

## 2021-11-25 DIAGNOSIS — M25561 Pain in right knee: Secondary | ICD-10-CM | POA: Diagnosis not present

## 2021-11-29 NOTE — Progress Notes (Signed)
[address], [place] [id number]   CLINIC:  Medical Oncology/Hematology  PCP:  Sharion Balloon, [place] [address] [phone]   REASON FOR VISIT:  Follow-up for iron deficiency anemia   CURRENT THERAPY: Intermittent IV iron (last Venofer 09/29/2021)   INTERVAL HISTORY:  Ms. Goldsmith 51 y.o. female returns for routine follow-up of her iron deficiency anemia.  She was last seen by Tarri Abernethy PA-C on 08/31/2021.  Patient reports that she has been feeling somewhat poorly since her last visit.  She has increasing right-sided knee pain from gout, and reports that she is fallen at home 3 times in the past few months.  She reports that 2 days after her most recent IV iron infusion (10/02/2021), patient had rash to her bilateral arms, without any shortness of breath or difficulty breathing.  She was treated at [facility] in [place] with IV Depo-Medrol injection 80 mg.  She reports the symptoms resolved within 3 days after steroid injection.  She has had 2 episodes of bright red blood per rectum since her last visit.  She reports that there was bright red blood in the toilet with bowel movement, as well as bright red blood on tissue when she wipes.  She denies any hematemesis, melena, or epistaxis.  Her stools tend to be dark due to iron supplementation.  She has not had any further vaginal bleeding since her isolated episode in late 2022.  She continues to feel fatigued, with energy about 60%  She  denies any shortness of breath with exertion.  She denies any pica, restless legs, headaches, chest pain, lightheadedness, syncope.  She has 50 % appetite. She endorses that she is maintaining a stable weight.   REVIEW OF SYSTEMS:  Review of Systems  Constitutional:  Positive for fatigue. Negative for appetite change, chills, diaphoresis, fever and unexpected weight change.  HENT:   Negative for lump/mass and nosebleeds.    Eyes:  Negative for eye problems.  Respiratory:  Negative for cough, hemoptysis and shortness of breath.   Cardiovascular:  Negative for chest pain, leg swelling and palpitations.  Gastrointestinal:  Positive for blood in stool (rectal bleeding x 2). Negative for abdominal pain, constipation, diarrhea, nausea and vomiting.  Genitourinary:  Negative for hematuria.   Musculoskeletal:  Positive for arthralgias (arthritis, right knee gout).  Skin: Negative.   Neurological:  Negative for dizziness, headaches and light-headedness.  Hematological:  Does not bruise/bleed easily.  Psychiatric/Behavioral:  Positive for sleep disturbance.       PAST MEDICAL/SURGICAL HISTORY:  Past Medical History:  Diagnosis Date   Anemia    Asthma    childhood   Carotidynia    COPD (chronic obstructive pulmonary disease) ([place])    DDD (degenerative disc disease), cervical    GERD (gastroesophageal reflux disease)    Hypertension    Knee pain    left meniscus tear s/p repair 11/2014   Seizures ([place])    last seizure was over 10 years ago, unknown etiology   Stuttering    patient reports this started after her knee surgery in 11/2014.    Thyroid nodule    Past Surgical History:  Procedure Laterality Date   ANTERIOR CERVICAL DECOMP/DISCECTOMY FUSION N/A 03/20/2019   Procedure: Anterior Cervical Decompression Fusion - Cervical Four-Cervical Five - Cervical Five-Cervical Six;  Surgeon: Eustace Moore, MD;  Location: [facility];  Service: Neurosurgery;  Laterality: N/A;  Anterior Cervical Decompression Fusion - Cervical  Four-Cervical Five - Cervical Five-Cervical Six   BIOPSY  09/20/2020   Procedure: BIOPSY;  Surgeon: Daneil Dolin, MD;  [facility];  Service: Endoscopy;;   COLONOSCOPY N/A 08/23/2016   Procedure: COLONOSCOPY;  Surgeon: Daneil Dolin, MD;  [facility];  Service: Endoscopy;  Laterality: N/A;  1:00pm   COLONOSCOPY N/A 08/24/2020   Diverticulosis in the entire examined colon.  Redundant colon. Exam otherwise normal   ESOPHAGOGASTRODUODENOSCOPY N/A 05/03/2016   Procedure: ESOPHAGOGASTRODUODENOSCOPY (EGD);  Surgeon: Daneil Dolin, MD;  [facility];  Service: Endoscopy;  Laterality: N/A;   ESOPHAGOGASTRODUODENOSCOPY N/A 08/23/2016   Procedure: ESOPHAGOGASTRODUODENOSCOPY (EGD);  Surgeon: Daneil Dolin, MD;  [facility];  Service: Endoscopy;  Laterality: N/A;   ESOPHAGOGASTRODUODENOSCOPY (EGD) WITH PROPOFOL N/A 09/20/2020   normal esophagus. Large hiatal hernia. Multiple gastric erosions (Cameron lesions). Normal duodenal bulb and second portion of the duodenum. Suspicion Lysbeth Galas lesions causing intermittent bleeding   KNEE ARTHROSCOPY Right    KNEE ARTHROSCOPY WITH MEDIAL MENISECTOMY Left 12/09/2014   Procedure: KNEE ARTHROSCOPY WITH PARTIAL MEDIAL AND LATERAL MENISECTOMY;  Surgeon: Sanjuana Kava, MD;  Location[facility];  Service: Orthopedics;  Laterality: Left;   TUBAL LIGATION     WISDOM TOOTH EXTRACTION       SOCIAL HISTORY:  Social History   Socioeconomic History   Marital status: Single    Spouse name: Not on file   Number of children: 1   Years of education: 12   Highest education level: Not on file  Occupational History   Occupation: Umemployed    Comment: Disability  Tobacco Use   Smoking status: Never   Smokeless tobacco: Never  Vaping Use   Vaping Use: Never used  Substance and Sexual Activity   Alcohol use: No   Drug use: No   Sexual activity: Not Currently    Birth control/protection: Surgical  Other Topics Concern   Not on file  Social History Narrative   Lives at home alone.   Disabled - speech difficulties, cannot drive   Right-handed.   Occasional caffeine use.   Her daughter lives about 30-45 minutes away   She has lots of family nearby - uncle, mother, sister, brother   Social Determinants of Health   Financial Resource Strain: [place]  (12/13/2020)   Overall Financial Resource Strain (CARDIA)     Difficulty of Paying Living Expenses: Not hard at all  Food Insecurity: No Food Insecurity (12/13/2020)   Hunger Vital Sign    Worried About Running Out of Food in the Last Year: Never true    [place] in the Last Year: Never true  Transportation Needs: No Transportation Needs (12/13/2020)   PRAPARE - Hydrologist (Medical): No    Lack of Transportation (Non-Medical): No  Physical Activity: Insufficiently Active (12/13/2020)   Exercise Vital Sign    Days of Exercise per Week: 2 days    Minutes of Exercise per Session: 30 min  Stress: No Stress Concern Present (12/13/2020)   [place]    Feeling of Stress : Not at all  Social Connections: Moderately Integrated (12/13/2020)   Social Connection and Isolation Panel [NHANES]    Frequency of Communication with Friends and Family: More than three times a week    Frequency of Social Gatherings with Friends and Family: More than three times a week    Attends Religious Services: More than  4 times per year    Active Member of Clubs or Organizations: Yes    Attends Archivist Meetings: More than 4 times per year    Marital Status: Never married  Intimate Partner Violence: Not At Risk (12/13/2020)   Humiliation, Afraid, Rape, and Kick questionnaire    Fear of Current or Ex-Partner: No    Emotionally Abused: No    Physically Abused: No    Sexually Abused: No    FAMILY HISTORY:  Family History  Problem Relation Age of Onset   Hypertension Mother    Lung cancer Father    Hypertension Brother    Colon cancer Neg Hx    Gastric cancer Neg Hx    Esophageal cancer Neg Hx     CURRENT MEDICATIONS:  Outpatient Encounter Medications as of 11/30/2021  Medication Sig Note   amLODipine (NORVASC) 10 MG tablet TAKE 1 TABLET ONCE DAILY    busPIRone (BUSPAR) 5 MG tablet TAKE (1) TABLET UP TO THREE TIMES DAILY.    Cholecalciferol (D 2000) 50 MCG (2000  UT) TABS Take by mouth daily.    colestipol (COLESTID) 1 g tablet Take 2 g by mouth daily.    diclofenac (VOLTAREN) 75 MG EC tablet Take 1 tablet (75 mg total) by mouth 2 (two) times daily.    diclofenac Sodium (VOLTAREN) 1 % GEL Apply 2 g topically 4 (four) times daily.    dicyclomine (BENTYL) 10 MG capsule Take 1 capsule (10 mg total) by mouth 4 (four) times daily -  before meals and at bedtime. For cramping and loose stool. Stop if constipation.    docusate sodium (COLACE) 100 MG capsule Take 100 mg by mouth daily.    escitalopram (LEXAPRO) 20 MG tablet TAKE ONE TABLET BY MOUTH EVERY DAY    ferrous sulfate 325 (65 FE) MG tablet Take 1 tablet by mouth daily.    lisinopril (ZESTRIL) 20 MG tablet TAKE 1 TABLET DAILY    meclizine (ANTIVERT) 25 MG tablet Take 1 tablet (25 mg total) by mouth 3 (three) times daily as needed for dizziness.    methocarbamol (ROBAXIN) 500 MG tablet Take by mouth every 8 (eight) hours as needed.    pantoprazole (PROTONIX) 40 MG tablet TAKE 1 TABLET ONCE DAILY BEFORE BREAKFAST    potassium chloride SA (KLOR-CON) 20 MEQ tablet Take 1 tablet (20 mEq total) by mouth 2 (two) times daily.    Vitamin D, Ergocalciferol, (DRISDOL) 1.25 MG (50000 UNIT) CAPS capsule Take 50,000 Units by mouth once a week. 08/18/2020: Wednesdays    No facility-administered encounter medications on file as of 11/30/2021.    ALLERGIES:  No Known Allergies   PHYSICAL EXAM:  ECOG PERFORMANCE STATUS: 1 - Symptomatic but completely ambulatory  There were no vitals filed for this visit. There were no vitals filed for this visit. Physical Exam Constitutional:      Appearance: Normal appearance. She is obese.  HENT:     Head: Normocephalic and atraumatic.     Mouth/Throat:     Mouth: Mucous membranes are moist.  Eyes:     Extraocular Movements: Extraocular movements intact.     Pupils: Pupils are equal, round, and reactive to light.  Cardiovascular:     Rate and Rhythm: Regular rhythm.      Pulses: Normal pulses.     Heart sounds: Normal heart sounds.  Pulmonary:     Effort: Pulmonary effort is normal.     Breath sounds: Normal breath sounds.  Abdominal:  General: Bowel sounds are normal.     Palpations: Abdomen is soft.     Tenderness: There is no abdominal tenderness.  Musculoskeletal:        General: No swelling.     Right lower leg: No edema.     Left lower leg: No edema.  Lymphadenopathy:     Cervical: No cervical adenopathy.  Skin:    General: Skin is warm and dry.  Neurological:     General: No focal deficit present.     Mental Status: She is alert and oriented to person, place, and time.  Psychiatric:        Mood and Affect: Mood normal.        Behavior: Behavior normal.     LABORATORY DATA:  I have reviewed the labs as listed.  CBC    Component Value Date/Time   WBC 6.2 08/24/2021 1031   RBC 3.55 (L) 08/24/2021 1031   HGB 10.8 (L) 08/24/2021 1031   HGB 12.6 10/24/2020 1318   HCT 33.4 (L) 08/24/2021 1031   HCT 39.0 10/24/2020 1318   PLT 255 08/24/2021 1031   PLT 277 10/24/2020 1318   MCV 94.1 08/24/2021 1031   MCV 88 10/24/2020 1318   MCH 30.4 08/24/2021 1031   MCHC 32.3 08/24/2021 1031   RDW 13.8 08/24/2021 1031   RDW 14.8 10/24/2020 1318   LYMPHSABS 1.8 08/24/2021 1031   LYMPHSABS 2.3 10/24/2020 1318   MONOABS 0.4 08/24/2021 1031   EOSABS 0.1 08/24/2021 1031   EOSABS 0.2 10/24/2020 1318   BASOSABS 0.0 08/24/2021 1031   BASOSABS 0.0 10/24/2020 1318      Latest Ref Rng & Units 08/10/2021   12:03 PM 12/16/2020   11:35 AM 10/24/2020    1:18 PM  CMP  Glucose 70 - 99 mg/dL 93  144  80   BUN 6 - 24 mg/dL '14  14  11   '$ Creatinine 0.57 - 1.00 mg/dL 0.91  0.69  0.76   Sodium 134 - 144 mmol/L 144  138  141   Potassium 3.5 - 5.2 mmol/L 4.2  3.3  4.2   Chloride 96 - 106 mmol/L 103  102  98   CO2 20 - 29 mmol/L '21  28  28   '$ Calcium 8.7 - 10.2 mg/dL 9.9  9.1  10.2   Total Protein 6.0 - 8.5 g/dL 7.4  7.5  8.3   Total Bilirubin 0.0 - 1.2 mg/dL  0.4  0.5  0.3   Alkaline Phos 44 - 121 IU/L 108  82  102   AST 0 - 40 IU/L '15  14  13   '$ ALT 0 - 32 IU/L '16  16  17     '$ DIAGNOSTIC IMAGING:  I have independently reviewed the relevant imaging and discussed with the patient.  ASSESSMENT & PLAN: 1.  Iron deficiency anemia with suspected occult GI bleed - ED on 07/18/2020 with Hgb 5.7, fatigue, lightheadedness.  Denied overt GI bleeding and was Hemoccult-negative, but does have a history of peptic ulcer disease.  She was transfused 2 units PRBCs on 07/18/2020, and followed up with gastroenterology.  - Colonoscopy (08/24/2020) showed diverticulosis, but was otherwise normal. - EGD (09/20/2020): Large hiatal hernia, Cameron lesions and antral erosions (suspicious for causing intermittent bleeding in the setting of ongoing NSAID use per GI note) - She is on Protonix for suspected recurrent peptic ulcer disease - Hematology work-up (07/29/2020) showed Hgb 8.5 (microcytic with MCV 78.2).  Ferritin was 7, with 32%  iron saturation.  Reticulocytes 3.4 (reticulocyte index 1.61, consistent with hypoproliferation in the setting of iron deficiency).   - Most recent IV Venofer x1000 mg from 09/15/2021 through 09/29/2021. - Per outside medical records, patient had mild postinfusion reaction with rash on bilateral arms after Venofer infusion on 09/29/2021. - She reports 2 episodes of bright blood per rectum in the past 3 months. - Symptomatic with fatigue   - Labs today (11/30/2021): Hgb 10.7/MCV 91.4, ferritin 44, iron saturation 13% - PLAN: Recommend IV Feraheme x2.  We will switch to Feraheme and PREMEDICATE WITH STEROIDS due to possible reaction after most recent Venofer. - Continue GI and GYN follow-up.  Patient advised to contact GI regarding her rectal bleeding. - RTC in 3 months for repeat labs and follow-up.  We will also check CMP and nutritional panel with next labs.    PLAN SUMMARY & DISPOSITION: IV iron with Feraheme x2 Labs in 3 months Office visit 1  week after labs  All questions were answered. The patient knows to call the clinic with any problems, questions or concerns.  Medical decision making: Moderate  Time spent on visit: I spent 20 minutes counseling the patient face to face. The total time spent in the appointment was 30 minutes and more than 50% was on counseling.   Harriett Rush, PA-C  11/30/2021 12:50 PM

## 2021-11-30 ENCOUNTER — Inpatient Hospital Stay (HOSPITAL_COMMUNITY): Payer: Medicare Other

## 2021-11-30 ENCOUNTER — Encounter: Payer: Self-pay | Admitting: Family Medicine

## 2021-11-30 ENCOUNTER — Ambulatory Visit (INDEPENDENT_AMBULATORY_CARE_PROVIDER_SITE_OTHER): Payer: Medicare Other | Admitting: Family Medicine

## 2021-11-30 ENCOUNTER — Inpatient Hospital Stay (HOSPITAL_COMMUNITY): Payer: Medicare Other | Attending: Hematology | Admitting: Physician Assistant

## 2021-11-30 ENCOUNTER — Other Ambulatory Visit: Payer: Self-pay | Admitting: Family

## 2021-11-30 VITALS — BP 105/70 | HR 112 | Temp 98.6°F | Ht 66.0 in | Wt 212.0 lb

## 2021-11-30 VITALS — BP 119/81 | HR 104 | Temp 98.0°F | Resp 18 | Ht 66.0 in | Wt 211.4 lb

## 2021-11-30 DIAGNOSIS — D500 Iron deficiency anemia secondary to blood loss (chronic): Secondary | ICD-10-CM | POA: Diagnosis not present

## 2021-11-30 DIAGNOSIS — D649 Anemia, unspecified: Secondary | ICD-10-CM

## 2021-11-30 DIAGNOSIS — D509 Iron deficiency anemia, unspecified: Secondary | ICD-10-CM | POA: Diagnosis not present

## 2021-11-30 DIAGNOSIS — M10061 Idiopathic gout, right knee: Secondary | ICD-10-CM

## 2021-11-30 DIAGNOSIS — F411 Generalized anxiety disorder: Secondary | ICD-10-CM

## 2021-11-30 LAB — CBC WITH DIFFERENTIAL/PLATELET
Abs Immature Granulocytes: 0.03 10*3/uL (ref 0.00–0.07)
Basophils Absolute: 0 10*3/uL (ref 0.0–0.1)
Basophils Relative: 0 %
Eosinophils Absolute: 0.2 10*3/uL (ref 0.0–0.5)
Eosinophils Relative: 3 %
HCT: 33 % — ABNORMAL LOW (ref 36.0–46.0)
Hemoglobin: 10.7 g/dL — ABNORMAL LOW (ref 12.0–15.0)
Immature Granulocytes: 1 %
Lymphocytes Relative: 32 %
Lymphs Abs: 2 10*3/uL (ref 0.7–4.0)
MCH: 29.6 pg (ref 26.0–34.0)
MCHC: 32.4 g/dL (ref 30.0–36.0)
MCV: 91.4 fL (ref 80.0–100.0)
Monocytes Absolute: 0.4 10*3/uL (ref 0.1–1.0)
Monocytes Relative: 7 %
Neutro Abs: 3.8 10*3/uL (ref 1.7–7.7)
Neutrophils Relative %: 57 %
Platelets: 255 10*3/uL (ref 150–400)
RBC: 3.61 MIL/uL — ABNORMAL LOW (ref 3.87–5.11)
RDW: 13.2 % (ref 11.5–15.5)
WBC: 6.4 10*3/uL (ref 4.0–10.5)
nRBC: 0 % (ref 0.0–0.2)

## 2021-11-30 LAB — FERRITIN: Ferritin: 44 ng/mL (ref 11–307)

## 2021-11-30 LAB — IRON AND TIBC
Iron: 50 ug/dL (ref 28–170)
Saturation Ratios: 13 % (ref 10.4–31.8)
TIBC: 393 ug/dL (ref 250–450)
UIBC: 343 ug/dL

## 2021-11-30 MED ORDER — PREDNISONE 20 MG PO TABS: ORAL_TABLET | ORAL | 0 refills | Status: DC

## 2021-11-30 MED ORDER — METHYLPREDNISOLONE ACETATE 40 MG/ML IJ SUSP
40.0000 mg | Freq: Once | INTRAMUSCULAR | Status: AC
  Administered 2021-11-30: 40 mg via INTRAMUSCULAR

## 2021-11-30 NOTE — Patient Instructions (Signed)
[place] at [facility] Discharge Instructions  You were seen today by Tarri Abernethy PA-C for your iron deficiency anemia.  Your blood and iron levels remain low.  I recommend additional IV iron x 2 doses.  Since you had a rash after your last IV iron, I will give you steroid premedications to decrease the risk of reaction.  I will see you for follow-up in 3 months.  ** Please call your GI doctor to tell them about the blood in your bowel movements.  LABS: Return in 3 months for repeat labs  MEDICATIONS: No changes to home medications  FOLLOW-UP APPOINTMENT: Office visit in 3 months   - - - - - - - - - - - - - - - - - -    Thank you for choosing [place] at [facility] to provide your oncology and hematology care.  To afford each patient quality time with our provider, please arrive at least 15 minutes before your scheduled appointment time.   If you have a lab appointment with the [facility] please come in thru the Main Entrance and check in at the main information desk.  You need to re-schedule your appointment should you arrive 10 or more minutes late.  We strive to give you quality time with our providers, and arriving late affects you and other patients whose appointments are after yours.  Also, if you no show three or more times for appointments you may be dismissed from the clinic at the providers discretion.     Again, thank you for choosing [facility].  Our hope is that these requests will decrease the amount of time that you wait before being seen by our physicians.       _____________________________________________________________  Should you have questions after your visit to [facility], please contact our office at [phone] and follow the prompts.  Our office hours are 8:00 a.m. and 4:30 p.m. Monday - Friday.  Please note that voicemails left after 4:00 p.m. may not be returned until  the following business day.  We are closed weekends and major holidays.  You do have access to a nurse 24-7, just call the main number to the clinic [phone] and do not press any options, hold on the line and a nurse will answer the phone.    For prescription refill requests, have your pharmacy contact our office and allow 72 hours.    Due to Covid, you will need to wear a mask upon entering the hospital. If you do not have a mask, a mask will be given to you at the Main Entrance upon arrival. For doctor visits, patients may have 1 support person age 28 or older with them. For treatment visits, patients can not have anyone with them due to social distancing guidelines and our immunocompromised population.

## 2021-11-30 NOTE — Addendum Note (Signed)
Addended by: Geryl Rankins D on: 11/30/2021 04:16 PM   Modules accepted: Orders

## 2021-11-30 NOTE — Progress Notes (Signed)
Subjective:  Patient ID: Brittany Durham, female    DOB: [birth year], 51 y.o.   MRN: [id number]  Patient Care Team: Sharion Balloon, FNP as PCP - General (Nurse Practitioner) Buford Dresser, MD as PCP - Cardiology (Cardiology) Gala Romney Cristopher Estimable, MD as Consulting Physician (Gastroenterology) Annitta Needs, NP (Gastroenterology) Konrad Saha as Physician Assistant (Oncology)   Chief Complaint:  right knee pain (Urgent care dx gout/Colchicine not helping)   HPI: Brittany Durham is a 51 y.o. female presenting on 11/30/2021 for right knee pain (Urgent care dx gout/Colchicine not helping)   Pt presents today with complaints of ongoing right knee pain. She was seen at UC two days ago for same. She was diagnosed with gout and placed on colchicine. States she was only given 3 pills and this did not improve the pain.   Knee Pain  The incident occurred 3 to 5 days ago. There was no injury mechanism. The pain is present in the right knee. The quality of the pain is described as aching, shooting and burning. The pain is at a severity of 7/10. The pain is moderate. The pain has been Constant since onset. Pertinent negatives include no inability to bear weight, loss of motion, loss of sensation, muscle weakness, numbness or tingling. She reports no foreign bodies present. The symptoms are aggravated by movement, palpation and weight bearing. She has tried NSAIDs for the symptoms. The treatment provided no relief.   Relevant past medical, surgical, family, and social history reviewed and updated as indicated.  Allergies and medications reviewed and updated. Data reviewed: Chart in Epic.   Past Medical History:  Diagnosis Date   Anemia    Asthma    childhood   Carotidynia    COPD (chronic obstructive pulmonary disease) ([place])    DDD (degenerative disc disease), cervical    GERD (gastroesophageal reflux disease)    Hypertension    Knee pain    left meniscus tear s/p repair  11/2014   Seizures ([place])    last seizure was over 10 years ago, unknown etiology   Stuttering    patient reports this started after her knee surgery in 11/2014.    Thyroid nodule     Past Surgical History:  Procedure Laterality Date   ANTERIOR CERVICAL DECOMP/DISCECTOMY FUSION N/A 03/20/2019   Procedure: Anterior Cervical Decompression Fusion - Cervical Four-Cervical Five - Cervical Five-Cervical Six;  Surgeon: Eustace Moore, MD;  Location: [facility];  Service: Neurosurgery;  Laterality: N/A;  Anterior Cervical Decompression Fusion - Cervical Four-Cervical Five - Cervical Five-Cervical Six   BIOPSY  09/20/2020   Procedure: BIOPSY;  Surgeon: Daneil Dolin, MD;  [facility];  Service: Endoscopy;;   COLONOSCOPY N/A 08/23/2016   Procedure: COLONOSCOPY;  Surgeon: Daneil Dolin, MD;  [facility];  Service: Endoscopy;  Laterality: N/A;  1:00pm   COLONOSCOPY N/A 08/24/2020   Diverticulosis in the entire examined colon. Redundant colon. Exam otherwise normal   ESOPHAGOGASTRODUODENOSCOPY N/A 05/03/2016   Procedure: ESOPHAGOGASTRODUODENOSCOPY (EGD);  Surgeon: Daneil Dolin, MD;  [facility];  Service: Endoscopy;  Laterality: N/A;   ESOPHAGOGASTRODUODENOSCOPY N/A 08/23/2016   Procedure: ESOPHAGOGASTRODUODENOSCOPY (EGD);  Surgeon: Daneil Dolin, MD;  [facility];  Service: Endoscopy;  Laterality: N/A;   ESOPHAGOGASTRODUODENOSCOPY (EGD) WITH PROPOFOL N/A 09/20/2020   normal esophagus. Large hiatal hernia. Multiple gastric erosions (Cameron lesions). Normal duodenal bulb and second portion of the duodenum. Suspicion Lysbeth Galas lesions causing intermittent bleeding  KNEE ARTHROSCOPY Right    KNEE ARTHROSCOPY WITH MEDIAL MENISECTOMY Left 12/09/2014   Procedure: KNEE ARTHROSCOPY WITH PARTIAL MEDIAL AND LATERAL MENISECTOMY;  Surgeon: Sanjuana Kava, MD;  Location: [facility];  Service: Orthopedics;  Laterality: Left;   TUBAL LIGATION     WISDOM TOOTH EXTRACTION       Social History   Socioeconomic History   Marital status: Single    Spouse name: Not on file   Number of children: 1   Years of education: 12   Highest education level: Not on file  Occupational History   Occupation: Umemployed    Comment: Disability  Tobacco Use   Smoking status: Never   Smokeless tobacco: Never  Vaping Use   Vaping Use: Never used  Substance and Sexual Activity   Alcohol use: No   Drug use: No   Sexual activity: Not Currently    Birth control/protection: Surgical  Other Topics Concern   Not on file  Social History Narrative   Lives at home alone.   Disabled - speech difficulties, cannot drive   Right-handed.   Occasional caffeine use.   Her daughter lives about 30-45 minutes away   She has lots of family nearby - uncle, mother, sister, brother   Social Determinants of Health   Financial Resource Strain: [place]  (12/13/2020)   Overall Financial Resource Strain (CARDIA)    Difficulty of Paying Living Expenses: Not hard at all  Food Insecurity: No Food Insecurity (12/13/2020)   Hunger Vital Sign    Worried About Running Out of Food in the Last Year: Never true    [place] in the Last Year: Never true  Transportation Needs: No Transportation Needs (12/13/2020)   PRAPARE - Hydrologist (Medical): No    Lack of Transportation (Non-Medical): No  Physical Activity: Insufficiently Active (12/13/2020)   Exercise Vital Sign    Days of Exercise per Week: 2 days    Minutes of Exercise per Session: 30 min  Stress: No Stress Concern Present (12/13/2020)   [place]    Feeling of Stress : Not at all  Social Connections: Moderately Integrated (12/13/2020)   Social Connection and Isolation Panel [NHANES]    Frequency of Communication with Friends and Family: More than three times a week    Frequency of Social Gatherings with Friends and Family: More than three times  a week    Attends Religious Services: More than 4 times per year    Active Member of [employer] or Organizations: Yes    Attends Archivist Meetings: More than 4 times per year    Marital Status: Never married  Intimate Partner Violence: Not At Risk (12/13/2020)   Humiliation, Afraid, Rape, and Kick questionnaire    Fear of Current or Ex-Partner: No    Emotionally Abused: No    Physically Abused: No    Sexually Abused: No    Outpatient Encounter Medications as of 11/30/2021  Medication Sig   amLODipine (NORVASC) 10 MG tablet TAKE 1 TABLET ONCE DAILY   busPIRone (BUSPAR) 5 MG tablet TAKE (1) TABLET UP TO THREE TIMES DAILY.   Cholecalciferol (D 2000) 50 MCG (2000 UT) TABS Take by mouth daily.   colchicine 0.6 MG tablet Take by mouth.   colestipol (COLESTID) 1 g tablet Take 2 g by mouth daily.   diclofenac (VOLTAREN) 75 MG EC tablet Take 1 tablet (75 mg total) by mouth 2 (  two) times daily.   docusate sodium (COLACE) 100 MG capsule Take 100 mg by mouth daily.   escitalopram (LEXAPRO) 20 MG tablet TAKE ONE TABLET BY MOUTH EVERY DAY   ferrous sulfate 325 (65 FE) MG tablet Take 1 tablet by mouth daily.   lisinopril (ZESTRIL) 20 MG tablet TAKE 1 TABLET DAILY   meclizine (ANTIVERT) 25 MG tablet Take 1 tablet (25 mg total) by mouth 3 (three) times daily as needed for dizziness.   methocarbamol (ROBAXIN) 500 MG tablet Take by mouth every 8 (eight) hours as needed.   pantoprazole (PROTONIX) 40 MG tablet TAKE 1 TABLET ONCE DAILY BEFORE BREAKFAST   potassium chloride SA (KLOR-CON) 20 MEQ tablet Take 1 tablet (20 mEq total) by mouth 2 (two) times daily.   predniSONE (DELTASONE) 20 MG tablet 2 po at sametime daily for 5 days- start tomorrow   Vitamin D, Ergocalciferol, (DRISDOL) 1.25 MG (50000 UNIT) CAPS capsule Take 50,000 Units by mouth once a week.   [DISCONTINUED] diclofenac Sodium (VOLTAREN) 1 % GEL Apply 2 g topically 4 (four) times daily.   [DISCONTINUED] dicyclomine (BENTYL) 10 MG capsule  Take 1 capsule (10 mg total) by mouth 4 (four) times daily -  before meals and at bedtime. For cramping and loose stool. Stop if constipation.   No facility-administered encounter medications on file as of 11/30/2021.    Allergies  Allergen Reactions   Venofer [Iron Sucrose] Rash    Review of Systems  Constitutional:  Negative for activity change, appetite change, chills, diaphoresis, fatigue, fever and unexpected weight change.  HENT: Negative.    Eyes: Negative.   Respiratory:  Negative for cough, chest tightness and shortness of breath.   Cardiovascular:  Negative for chest pain, palpitations and leg swelling.  Gastrointestinal:  Negative for abdominal pain, blood in stool, constipation, diarrhea, nausea and vomiting.  Endocrine: Negative.   Genitourinary:  Negative for dysuria, frequency and urgency.  Musculoskeletal:  Positive for arthralgias, gait problem and joint swelling. Negative for back pain, myalgias, neck pain and neck stiffness.  Skin: Negative.   Allergic/Immunologic: Negative.   Neurological:  Negative for dizziness, tingling, numbness and headaches.  Hematological: Negative.   Psychiatric/Behavioral:  Negative for confusion, hallucinations, sleep disturbance and suicidal ideas.   All other systems reviewed and are negative.       Objective:  BP 105/70   Pulse (!) 112   Temp 98.6 F (37 C)   Ht '5\' 6"'$  (1.676 m)   Wt 212 lb (96.2 kg)   LMP 09/16/2017   SpO2 95%   BMI 34.22 kg/m    Wt Readings from Last 3 Encounters:  11/30/21 212 lb (96.2 kg)  11/30/21 211 lb 6.4 oz (95.9 kg)  08/31/21 215 lb 9.8 oz (97.8 kg)    Physical Exam Vitals and nursing note reviewed.  Constitutional:      General: She is not in acute distress.    Appearance: Normal appearance. She is obese. She is not ill-appearing, toxic-appearing or diaphoretic.  HENT:     Head: Normocephalic and atraumatic.  Eyes:     Pupils: Pupils are equal, round, and reactive to light.   Cardiovascular:     Rate and Rhythm: Normal rate and regular rhythm.     Pulses: Normal pulses.     Heart sounds: Normal heart sounds.  Pulmonary:     Effort: Pulmonary effort is normal.     Breath sounds: Normal breath sounds.  Musculoskeletal:     Right upper leg: Normal.  Left upper leg: Normal.     Right knee: Swelling and erythema present. No deformity, effusion, ecchymosis, lacerations, bony tenderness or crepitus. Normal range of motion. Tenderness present. No medial joint line, lateral joint line, MCL, LCL, ACL, PCL or patellar tendon tenderness. No LCL laxity, MCL laxity, ACL laxity or PCL laxity. Normal alignment, normal meniscus and normal patellar mobility. Normal pulse.     Right lower leg: Normal.     Left lower leg: Normal.  Neurological:     General: No focal deficit present.     Mental Status: She is alert and oriented to person, place, and time.  Psychiatric:        Mood and Affect: Mood normal.        Behavior: Behavior normal.        Thought Content: Thought content normal.        Judgment: Judgment normal.     Comments: Stuttering     Results for orders placed or performed in visit on 11/30/21  CBC with Differential/Platelet  Result Value Ref Range   WBC 6.4 4.0 - 10.5 K/uL   RBC 3.61 (L) 3.87 - 5.11 MIL/uL   Hemoglobin 10.7 (L) 12.0 - 15.0 g/dL   HCT 33.0 (L) 36.0 - 46.0 %   MCV 91.4 80.0 - 100.0 fL   MCH 29.6 26.0 - 34.0 pg   MCHC 32.4 30.0 - 36.0 g/dL   RDW 13.2 11.5 - 15.5 %   Platelets 255 150 - 400 K/uL   nRBC 0.0 0.0 - 0.2 %   Neutrophils Relative % 57 %   Neutro Abs 3.8 1.7 - 7.7 K/uL   Lymphocytes Relative 32 %   Lymphs Abs 2.0 0.7 - 4.0 K/uL   Monocytes Relative 7 %   Monocytes Absolute 0.4 0.1 - 1.0 K/uL   Eosinophils Relative 3 %   Eosinophils Absolute 0.2 0.0 - 0.5 K/uL   Basophils Relative 0 %   Basophils Absolute 0.0 0.0 - 0.1 K/uL   Immature Granulocytes 1 %   Abs Immature Granulocytes 0.03 0.00 - 0.07 K/uL  Iron and TIBC   Result Value Ref Range   Iron 50 28 - 170 ug/dL   TIBC 393 250 - 450 ug/dL   Saturation Ratios 13 10.4 - 31.8 %   UIBC 343 ug/dL  Ferritin  Result Value Ref Range   Ferritin 44 11 - 307 ng/mL       Pertinent labs & imaging results that were available during my care of the patient were reviewed by me and considered in my medical decision making.  Assessment & Plan:  Brittany Durham was seen today for right knee pain.  Diagnoses and all orders for this visit:  Acute idiopathic gout of right knee Failed colchicine treatment for gout flare, was only dosed with 3 pills. Will burst with steroids. No indications of septic joint. No systemic signs of infection. Pt aware of red flags. Report new, worsening, or persistent symptoms.  -     predniSONE (DELTASONE) 20 MG tablet; 2 po at sametime daily for 5 days- start tomorrow     Continue all other maintenance medications.  Follow up plan: Return if symptoms worsen or fail to improve.   Continue healthy lifestyle choices, including diet (rich in fruits, vegetables, and lean proteins, and low in salt and simple carbohydrates) and exercise (at least 30 minutes of moderate physical activity daily).  Educational handout given for gout  The above assessment and management plan was discussed with the  patient. The patient verbalized understanding of and has agreed to the management plan. Patient is aware to call the clinic if they develop any new symptoms or if symptoms persist or worsen. Patient is aware when to return to the clinic for a follow-up visit. Patient educated on when it is appropriate to go to the emergency department.   Brittany Pouch, FNP-C [facility] [phone]

## 2021-12-07 ENCOUNTER — Inpatient Hospital Stay (HOSPITAL_COMMUNITY): Payer: Medicare Other

## 2021-12-07 VITALS — BP 100/66 | HR 87 | Temp 98.6°F | Resp 18

## 2021-12-07 DIAGNOSIS — D62 Acute posthemorrhagic anemia: Secondary | ICD-10-CM

## 2021-12-07 DIAGNOSIS — D500 Iron deficiency anemia secondary to blood loss (chronic): Secondary | ICD-10-CM

## 2021-12-07 DIAGNOSIS — D509 Iron deficiency anemia, unspecified: Secondary | ICD-10-CM | POA: Diagnosis not present

## 2021-12-07 MED ORDER — FAMOTIDINE IN NACL 20-0.9 MG/50ML-% IV SOLN
20.0000 mg | Freq: Once | INTRAVENOUS | Status: AC
  Administered 2021-12-07: 20 mg via INTRAVENOUS
  Filled 2021-12-07: qty 50

## 2021-12-07 MED ORDER — LORATADINE 10 MG PO TABS
10.0000 mg | ORAL_TABLET | Freq: Once | ORAL | Status: AC
  Administered 2021-12-07: 10 mg via ORAL
  Filled 2021-12-07: qty 1

## 2021-12-07 MED ORDER — METHYLPREDNISOLONE SODIUM SUCC 125 MG IJ SOLR
125.0000 mg | Freq: Once | INTRAMUSCULAR | Status: AC
  Administered 2021-12-07: 125 mg via INTRAVENOUS
  Filled 2021-12-07: qty 2

## 2021-12-07 MED ORDER — SODIUM CHLORIDE 0.9 % IV SOLN
510.0000 mg | Freq: Once | INTRAVENOUS | Status: AC
  Administered 2021-12-07: 510 mg via INTRAVENOUS
  Filled 2021-12-07: qty 510
  Filled 2021-12-07: qty 17

## 2021-12-07 MED ORDER — SODIUM CHLORIDE 0.9 % IV SOLN: Freq: Once | INTRAVENOUS | Status: AC

## 2021-12-07 MED ORDER — ACETAMINOPHEN 325 MG PO TABS
650.0000 mg | ORAL_TABLET | Freq: Once | ORAL | Status: AC
  Administered 2021-12-07: 650 mg via ORAL
  Filled 2021-12-07: qty 2

## 2021-12-07 NOTE — Progress Notes (Signed)
Treatment given per orders. Patient tolerated it well without problems. Vitals stable and discharged home from clinic ambulatory. Follow up as scheduled.  

## 2021-12-07 NOTE — Patient Instructions (Signed)
[facility]  Discharge Instructions: Thank you for choosing [facility] to provide your oncology and hematology care.  If you have a lab appointment with the Cancer Center, please come in thru the Main Entrance and check in at the main information desk.  Wear comfortable clothing and clothing appropriate for easy access to any Portacath or PICC line.   We strive to give you quality time with your provider. You may need to reschedule your appointment if you arrive late (15 or more minutes).  Arriving late affects you and other patients whose appointments are after yours.  Also, if you miss three or more appointments without notifying the office, you may be dismissed from the clinic at the provider's discretion.      For prescription refill requests, have your pharmacy contact our office and allow 72 hours for refills to be completed.         To help prevent nausea and vomiting after your treatment, we encourage you to take your nausea medication as directed.  BELOW ARE SYMPTOMS THAT SHOULD BE REPORTED IMMEDIATELY: *FEVER GREATER THAN 100.4 F (38 C) OR HIGHER *CHILLS OR SWEATING *NAUSEA AND VOMITING THAT IS NOT CONTROLLED WITH YOUR NAUSEA MEDICATION *UNUSUAL SHORTNESS OF BREATH *UNUSUAL BRUISING OR BLEEDING *URINARY PROBLEMS (pain or burning when urinating, or frequent urination) *BOWEL PROBLEMS (unusual diarrhea, constipation, pain near the anus) TENDERNESS IN MOUTH AND THROAT WITH OR WITHOUT PRESENCE OF ULCERS (sore throat, sores in mouth, or a toothache) UNUSUAL RASH, SWELLING OR PAIN  UNUSUAL VAGINAL DISCHARGE OR ITCHING   Items with * indicate a potential emergency and should be followed up as soon as possible or go to the Emergency Department if any problems should occur.  Please show the CHEMOTHERAPY ALERT CARD or IMMUNOTHERAPY ALERT CARD at check-in to the Emergency Department and triage nurse.  Should you have questions after your visit or need to  cancel or reschedule your appointment, please contact [facility] [phone]  and follow the prompts.  Office hours are 8:00 a.m. to 4:30 p.m. Monday - Friday. Please note that voicemails left after 4:00 p.m. may not be returned until the following business day.  We are closed weekends and major holidays. You have access to a nurse at all times for urgent questions. Please call the main number to the clinic [phone] and follow the prompts.  For any non-urgent questions, you may also contact your provider using MyChart. We now offer e-Visits for anyone 18 and older to request care online for non-urgent symptoms. For details visit [email or web address].   Also download the MyChart app! Go to the app store, search "MyChart", open the app, select [facility], and log in with your MyChart username and password.  Masks are optional in the cancer centers. If you would like for your care team to wear a mask while they are taking care of you, please let them know. For doctor visits, patients may have with them one support person who is at least 53 years old. At this time, visitors are not allowed in the infusion area.  

## 2021-12-14 ENCOUNTER — Ambulatory Visit (INDEPENDENT_AMBULATORY_CARE_PROVIDER_SITE_OTHER): Payer: Medicare Other

## 2021-12-14 VITALS — Wt 212.0 lb

## 2021-12-14 DIAGNOSIS — Z0000 Encounter for general adult medical examination without abnormal findings: Secondary | ICD-10-CM | POA: Diagnosis not present

## 2021-12-14 DIAGNOSIS — R197 Diarrhea, unspecified: Secondary | ICD-10-CM | POA: Diagnosis not present

## 2021-12-14 DIAGNOSIS — K529 Noninfective gastroenteritis and colitis, unspecified: Secondary | ICD-10-CM | POA: Diagnosis not present

## 2021-12-14 DIAGNOSIS — Z79899 Other long term (current) drug therapy: Secondary | ICD-10-CM | POA: Diagnosis not present

## 2021-12-14 DIAGNOSIS — K921 Melena: Secondary | ICD-10-CM | POA: Diagnosis not present

## 2021-12-14 DIAGNOSIS — Z9049 Acquired absence of other specified parts of digestive tract: Secondary | ICD-10-CM | POA: Diagnosis not present

## 2021-12-14 DIAGNOSIS — Q438 Other specified congenital malformations of intestine: Secondary | ICD-10-CM | POA: Diagnosis not present

## 2021-12-14 NOTE — Progress Notes (Signed)
Virtual Visit via Telephone Note  I connected with  Brittany Durham on 12/14/21 at  2:30 PM EDT by telephone and verified that I am speaking with the correct person using two identifiers.  Location: Patient: home Provider: [facility] Persons participating in the virtual visit: patient/Nurse Health Advisor   I discussed the limitations, risks, security and privacy concerns of performing an evaluation and management service by telephone and the availability of in person appointments. The patient expressed understanding and agreed to proceed.  Interactive audio and video telecommunications were attempted between this nurse and patient, however failed, due to patient having technical difficulties OR patient did not have access to video capability.  We continued and completed visit with audio only.  Some vital signs may be absent or patient reported.   Dionisio David, LPN  Subjective:   Brittany Durham is a 51 y.o. female who presents for Medicare Annual (Subsequent) preventive examination.  Review of Systems           Objective:    There were no vitals filed for this visit. There is no height or weight on file to calculate BMI.     11/30/2021   10:15 AM 09/29/2021    9:39 AM 09/22/2021    9:48 AM 08/31/2021   10:12 AM 08/09/2021    1:23 PM 05/19/2021    9:07 AM 05/04/2021    8:26 AM  Advanced Directives  Does Patient Have a Medical Advance Directive? No No No No No No No  Type of Teacher, early years/pre;Living will   Does patient want to make changes to medical advance directive?  No - Patient declined No - Patient declined    No - Patient declined  Copy of [place] in Chart?      No - copy requested   Would patient like information on creating a medical advance directive? No - Patient declined No - Patient declined No - Patient declined No - Patient declined  No - Patient declined No - Patient declined    Current Medications  (verified) Outpatient Encounter Medications as of 12/14/2021  Medication Sig   amLODipine (NORVASC) 10 MG tablet TAKE 1 TABLET ONCE DAILY   busPIRone (BUSPAR) 5 MG tablet TAKE ONE TABLET BY MOUTH UP TO THREE TIMES DAILY.   Cholecalciferol (D 2000) 50 MCG (2000 UT) TABS Take by mouth daily.   colchicine 0.6 MG tablet Take by mouth.   colestipol (COLESTID) 1 g tablet Take 2 g by mouth daily.   docusate sodium (COLACE) 100 MG capsule Take 100 mg by mouth daily.   escitalopram (LEXAPRO) 20 MG tablet TAKE ONE TABLET BY MOUTH EVERY DAY   ferrous sulfate 325 (65 FE) MG tablet Take 1 tablet by mouth daily.   lisinopril (ZESTRIL) 20 MG tablet TAKE 1 TABLET DAILY   meclizine (ANTIVERT) 25 MG tablet Take 1 tablet (25 mg total) by mouth 3 (three) times daily as needed for dizziness.   pantoprazole (PROTONIX) 40 MG tablet TAKE 1 TABLET ONCE DAILY BEFORE BREAKFAST   potassium chloride SA (KLOR-CON) 20 MEQ tablet Take 1 tablet (20 mEq total) by mouth 2 (two) times daily.   Vitamin D, Ergocalciferol, (DRISDOL) 1.25 MG (50000 UNIT) CAPS capsule Take 50,000 Units by mouth once a week.   diclofenac (VOLTAREN) 75 MG EC tablet Take 1 tablet (75 mg total) by mouth 2 (two) times daily. (Patient not taking: Reported on 12/14/2021)   methocarbamol (ROBAXIN) 500 MG  tablet Take by mouth every 8 (eight) hours as needed. (Patient not taking: Reported on 12/14/2021)   predniSONE (DELTASONE) 20 MG tablet 2 po at sametime daily for 5 days- start tomorrow (Patient not taking: Reported on 12/14/2021)   No facility-administered encounter medications on file as of 12/14/2021.    Allergies (verified) Venofer [iron sucrose]   History: Past Medical History:  Diagnosis Date   Anemia    Asthma    childhood   Carotidynia    COPD (chronic obstructive pulmonary disease) (HCC)    DDD (degenerative disc disease), cervical    GERD (gastroesophageal reflux disease)    Hypertension    Knee pain    left meniscus tear s/p repair 11/2014    Seizures ([place])    last seizure was over 10 years ago, unknown etiology   Stuttering    patient reports this started after her knee surgery in 11/2014.    Thyroid nodule    Past Surgical History:  Procedure Laterality Date   ANTERIOR CERVICAL DECOMP/DISCECTOMY FUSION N/A 03/20/2019   Procedure: Anterior Cervical Decompression Fusion - Cervical Four-Cervical Five - Cervical Five-Cervical Six;  Surgeon: Eustace Moore, MD;  Location: [facility];  Service: Neurosurgery;  Laterality: N/A;  Anterior Cervical Decompression Fusion - Cervical Four-Cervical Five - Cervical Five-Cervical Six   BIOPSY  09/20/2020   Procedure: BIOPSY;  Surgeon: Daneil Dolin, MD;  [facility];  Service: Endoscopy;;   COLONOSCOPY N/A 08/23/2016   Procedure: COLONOSCOPY;  Surgeon: Daneil Dolin, MD;  [facility];  Service: Endoscopy;  Laterality: N/A;  1:00pm   COLONOSCOPY N/A 08/24/2020   Diverticulosis in the entire examined colon. Redundant colon. Exam otherwise normal   ESOPHAGOGASTRODUODENOSCOPY N/A 05/03/2016   Procedure: ESOPHAGOGASTRODUODENOSCOPY (EGD);  Surgeon: Daneil Dolin, MD;  [facility];  Service: Endoscopy;  Laterality: N/A;   ESOPHAGOGASTRODUODENOSCOPY N/A 08/23/2016   Procedure: ESOPHAGOGASTRODUODENOSCOPY (EGD);  Surgeon: Daneil Dolin, MD;  [facility];  Service: Endoscopy;  Laterality: N/A;   ESOPHAGOGASTRODUODENOSCOPY (EGD) WITH PROPOFOL N/A 09/20/2020   normal esophagus. Large hiatal hernia. Multiple gastric erosions (Cameron lesions). Normal duodenal bulb and second portion of the duodenum. Suspicion Lysbeth Galas lesions causing intermittent bleeding   KNEE ARTHROSCOPY Right    KNEE ARTHROSCOPY WITH MEDIAL MENISECTOMY Left 12/09/2014   Procedure: KNEE ARTHROSCOPY WITH PARTIAL MEDIAL AND LATERAL MENISECTOMY;  Surgeon: Sanjuana Kava, MD;  Location: [facility];  Service: Orthopedics;  Laterality: Left;   TUBAL LIGATION     WISDOM TOOTH EXTRACTION     Family  History  Problem Relation Age of Onset   Hypertension Mother    Lung cancer Father    Hypertension Brother    Colon cancer Neg Hx    Gastric cancer Neg Hx    Esophageal cancer Neg Hx    Social History   Socioeconomic History   Marital status: Single    Spouse name: Not on file   Number of children: 1   Years of education: 12   Highest education level: Not on file  Occupational History   Occupation: Umemployed    Comment: Disability  Tobacco Use   Smoking status: Never   Smokeless tobacco: Never  Vaping Use   Vaping Use: Never used  Substance and Sexual Activity   Alcohol use: No   Drug use: No   Sexual activity: Not Currently    Birth control/protection: Surgical  Other Topics Concern   Not on file  Social History Narrative  Lives at home alone.   Disabled - speech difficulties, cannot drive   Right-handed.   Occasional caffeine use.   Her daughter lives about 30-45 minutes away   She has lots of family nearby - uncle, mother, sister, brother   Social Determinants of Health   Financial Resource Strain: [place]  (12/13/2020)   Overall Financial Resource Strain (CARDIA)    Difficulty of Paying Living Expenses: Not hard at all  Food Insecurity: No Food Insecurity (12/13/2020)   Hunger Vital Sign    Worried About Running Out of Food in the Last Year: Never true    [place] in the Last Year: Never true  Transportation Needs: No Transportation Needs (12/13/2020)   PRAPARE - Hydrologist (Medical): No    Lack of Transportation (Non-Medical): No  Physical Activity: Insufficiently Active (12/14/2021)   Exercise Vital Sign    Days of Exercise per Week: 2 days    Minutes of Exercise per Session: 20 min  Stress: No Stress Concern Present (12/14/2021)   [place]    Feeling of Stress : Not at all  Social Connections: Moderately Integrated (12/13/2020)   Social Connection and  Isolation Panel [NHANES]    Frequency of Communication with Friends and Family: More than three times a week    Frequency of Social Gatherings with Friends and Family: More than three times a week    Attends Religious Services: More than 4 times per year    Active Member of Clubs or Organizations: Yes    Attends Music therapist: More than 4 times per year    Marital Status: Never married    Tobacco Counseling Counseling given: Not Answered   Clinical Intake:  Pre-visit preparation completed: Yes  Pain : No/denies pain     Nutritional Risks: None Diabetes: No  How often do you need to have someone help you when you read instructions, pamphlets, or other written materials from your doctor or pharmacy?: 1 - Never  Diabetic?no  Interpreter Needed?: No  Information entered by :: Kirke Shaggy, LPN   Activities of Daily Living     No data to display          Patient Care Team: Sharion Balloon, FNP as PCP - General (Nurse Practitioner) Buford Dresser, MD as PCP - Cardiology (Cardiology) Gala Romney Cristopher Estimable, MD as Consulting Physician (Gastroenterology) Annitta Needs, NP (Gastroenterology) Konrad Saha as Physician Assistant (Oncology)  Indicate any recent Medical Services you may have received from other than [facility] providers in the past year (date may be approximate).     Assessment:   This is a routine wellness examination for Brittany Durham.  Hearing/Vision screen No results found.  Dietary issues and exercise activities discussed:     Goals Addressed             This Visit's Progress    DIET - EAT MORE FRUITS AND VEGETABLES         Depression Screen    12/14/2021    2:36 PM 11/30/2021    3:50 PM 07/25/2021    3:59 PM 02/09/2021   10:18 AM 12/13/2020    3:46 PM 11/09/2020    3:59 PM 07/15/2020   10:05 AM  PHQ 2/9 Scores  PHQ - 2 Score '1 3 3 '$ 0 '1 1 6  '$ PHQ- 9 Score '1 10 9  5 3 21    '$ Fall Risk  07/25/2021    3:56 PM  12/13/2020    4:00 PM 11/09/2020    3:59 PM 07/15/2020    9:33 AM 07/15/2020    9:12 AM  Fall Risk   Falls in the past year? '1 1 1 1 '$ 0  Number falls in past yr: '1 1 1 1   '$ Injury with Fall? 0 1 1 0   Risk for fall due to : Orthopedic patient History of fall(s);Impaired balance/gait;Orthopedic patient;Impaired vision;Medication side effect History of fall(s) History of fall(s);Impaired balance/gait   Follow up Falls evaluation completed Education provided;Falls prevention discussed  Falls evaluation completed     FALL RISK PREVENTION PERTAINING TO THE HOME:  Any stairs in or around the home? No  If so, are there any without handrails? No  Home free of loose throw rugs in walkways, pet beds, electrical cords, etc? Yes  Adequate lighting in your home to reduce risk of falls? Yes   ASSISTIVE DEVICES UTILIZED TO PREVENT FALLS:  Life alert? No  Use of a cane, walker or w/c? No  Grab bars in the bathroom? No  Shower chair or bench in shower? No  Elevated toilet seat or a handicapped toilet? No    Cognitive Function:declined        12/13/2020    3:55 PM 12/09/2019    1:37 PM 12/09/2019    1:29 PM 10/29/2018    3:39 PM  6CIT Screen  What Year? 0 points  0 points 0 points  What month? 0 points  0 points 0 points  What time? 0 points  0 points 0 points  Count back from 20 0 points  0 points 0 points  Months in reverse 0 points 2 points 0 points 0 points  Repeat phrase 0 points  0 points 0 points  Total Score 0 points  0 points 0 points    Immunizations Immunization History  Administered Date(s) Administered   Moderna Sars-Covid-2 Vaccination 07/25/2019, 08/26/2019   Tdap 08/10/2021    TDAP status: Up to date  Flu Vaccine status: Declined, Education has been provided regarding the importance of this vaccine but patient still declined. Advised may receive this vaccine at local pharmacy or Health Dept. Aware to provide a copy of the vaccination record if obtained from local pharmacy or  Health Dept. Verbalized acceptance and understanding.  Pneumococcal vaccine status: Declined,  Education has been provided regarding the importance of this vaccine but patient still declined. Advised may receive this vaccine at local pharmacy or Health Dept. Aware to provide a copy of the vaccination record if obtained from local pharmacy or Health Dept. Verbalized acceptance and understanding.   Covid-19 vaccine status: Completed vaccines  Qualifies for Shingles Vaccine? Yes   Zostavax completed No   Shingrix Completed?: No.    Education has been provided regarding the importance of this vaccine. Patient has been advised to call insurance company to determine out of pocket expense if they have not yet received this vaccine. Advised may also receive vaccine at local pharmacy or Health Dept. Verbalized acceptance and understanding.  Screening Tests Health Maintenance  Topic Date Due   Zoster Vaccines- Shingrix (1 of 2) Never done   COVID-19 Vaccine (3 - Moderna series) 10/21/2019   PAP SMEAR-Modifier  04/30/2020   INFLUENZA VACCINE  12/12/2021   MAMMOGRAM  03/21/2023   COLONOSCOPY (Pts 45-45yr Insurance coverage will need to be confirmed)  08/25/2030   TETANUS/TDAP  08/11/2031   Hepatitis C Screening  Completed   HIV Screening  Completed   HPV VACCINES  Aged Out    Health Maintenance  Health Maintenance Due  Topic Date Due   Zoster Vaccines- Shingrix (1 of 2) Never done   COVID-19 Vaccine (3 - Moderna series) 10/21/2019   PAP SMEAR-Modifier  04/30/2020   INFLUENZA VACCINE  12/12/2021    Colorectal cancer screening: Type of screening: Colonoscopy. Completed 08/24/20. Repeat every 10 years  Mammogram status: Completed 03/20/21. Repeat every year    Lung Cancer Screening: (Low Dose CT Chest recommended if Age 26-80 years, 30 pack-year currently smoking OR have quit w/in 15years.) does not qualify.    Additional Screening:  Hepatitis C Screening: does qualify; Completed  01/14/20  Vision Screening: Recommended annual ophthalmology exams for early detection of glaucoma and other disorders of the eye. Is the patient up to date with their annual eye exam?  Yes  Who is the provider or what is the name of the office in which the patient attends annual eye exams? [facility] [place] If pt is not established with a provider, would they like to be referred to a provider to establish care? No .   Dental Screening: Recommended annual dental exams for proper oral hygiene  Community Resource Referral / Chronic Care Management: CRR required this visit?  No   CCM required this visit?  No      Plan:     I have personally reviewed and noted the following in the patient's chart:   Medical and social history Use of alcohol, tobacco or illicit drugs  Current medications and supplements including opioid prescriptions.  Functional ability and status Nutritional status Physical activity Advanced directives List of other physicians Hospitalizations, surgeries, and ER visits in previous 12 months Vitals Screenings to include cognitive, depression, and falls Referrals and appointments  In addition, I have reviewed and discussed with patient certain preventive protocols, quality metrics, and best practice recommendations. A written personalized care plan for preventive services as well as general preventive health recommendations were provided to patient.     Dionisio David, LPN   10/20/319   Nurse Notes: none

## 2021-12-14 NOTE — Patient Instructions (Signed)
[name] , Thank you for taking time to come for your Medicare Wellness Visit. I appreciate your ongoing commitment to your health goals. Please review the following plan we discussed and let me know if I can assist you in the future.   Screening recommendations/referrals: Colonoscopy: 08/24/20 Mammogram: 03/20/21 Bone Density: too young Recommended yearly ophthalmology/optometry visit for glaucoma screening and checkup Recommended yearly dental visit for hygiene and checkup  Vaccinations: Influenza vaccine: n/d Pneumococcal vaccine: n/d Tdap vaccine: 08/10/21 Shingles vaccine: n/d  Covid-19: 07/25/19, 08/26/19  Advanced directives: no  Conditions/risks identified: none  Next appointment: Follow up in one year for your annual wellness visit. 12/17/22 @ 1:45 pm by phone  Preventive Care 40-64 Years, Female Preventive care refers to lifestyle choices and visits with your health care provider that can promote health and wellness. What does preventive care include? A yearly physical exam. This is also called an annual well check. Dental exams once or twice a year. Routine eye exams. Ask your health care provider how often you should have your eyes checked. Personal lifestyle choices, including: Daily care of your teeth and gums. Regular physical activity. Eating a healthy diet. Avoiding tobacco and drug use. Limiting alcohol use. Practicing safe sex. Taking low-dose aspirin daily starting at age 18. Taking vitamin and mineral supplements as recommended by your health care provider. What happens during an annual well check? The services and screenings done by your health care provider during your annual well check will depend on your age, overall health, lifestyle risk factors, and family history of disease. Counseling  Your health care provider may ask you questions about your: Alcohol use. Tobacco use. Drug use. Emotional well-being. Home and relationship well-being. Sexual  activity. Eating habits. Work and work Statistician. Method of birth control. Menstrual cycle. Pregnancy history. Screening  You may have the following tests or measurements: Height, weight, and BMI. Blood pressure. Lipid and cholesterol levels. These may be checked every 5 years, or more frequently if you are over 24 years old. Skin check. Lung cancer screening. You may have this screening every year starting at age 23 if you have a 30-pack-year history of smoking and currently smoke or have quit within the past 15 years. Fecal occult blood test (FOBT) of the stool. You may have this test every year starting at age 53. Flexible sigmoidoscopy or colonoscopy. You may have a sigmoidoscopy every 5 years or a colonoscopy every 10 years starting at age 55. Hepatitis C blood test. Hepatitis B blood test. Sexually transmitted disease (STD) testing. Diabetes screening. This is done by checking your blood sugar (glucose) after you have not eaten for a while (fasting). You may have this done every 1-3 years. Mammogram. This may be done every 1-2 years. Talk to your health care provider about when you should start having regular mammograms. This may depend on whether you have a family history of breast cancer. BRCA-related cancer screening. This may be done if you have a family history of breast, ovarian, tubal, or peritoneal cancers. Pelvic exam and Pap test. This may be done every 3 years starting at age 64. Starting at age 33, this may be done every 5 years if you have a Pap test in combination with an HPV test. Bone density scan. This is done to screen for osteoporosis. You may have this scan if you are at high risk for osteoporosis. Discuss your test results, treatment options, and if necessary, the need for more tests with your health care provider. Vaccines  Your health care provider may recommend certain vaccines, such as: Influenza vaccine. This is recommended every year. Tetanus, diphtheria,  and acellular pertussis (Tdap, Td) vaccine. You may need a Td booster every 10 years. Zoster vaccine. You may need this after age 60. Pneumococcal 13-valent conjugate (PCV13) vaccine. You may need this if you have certain conditions and were not previously vaccinated. Pneumococcal polysaccharide (PPSV23) vaccine. You may need one or two doses if you smoke cigarettes or if you have certain conditions. Talk to your health care provider about which screenings and vaccines you need and how often you need them. This information is not intended to replace advice given to you by your health care provider. Make sure you discuss any questions you have with your health care provider. Document Released: 05/27/2015 Document Revised: 01/18/2016 Document Reviewed: 03/01/2015 Elsevier Interactive Patient Education  2017 Elsevier Inc[employer]    Fall Prevention in the Home Falls can cause injuries. They can happen to people of all ages. There are many things you can do to make your home safe and to help prevent falls. What can I do on the outside of my home? Regularly fix the edges of walkways and driveways and fix any cracks. Remove anything that might make you trip as you walk through a door, such as a raised step or threshold. Trim any bushes or trees on the path to your home. Use bright outdoor lighting. Clear any walking paths of anything that might make someone trip, such as rocks or tools. Regularly check to see if handrails are loose or broken. Make sure that both sides of any steps have handrails. Any raised decks and porches should have guardrails on the edges. Have any leaves, snow, or ice cleared regularly. Use sand or salt on walking paths during winter. Clean up any spills in your garage right away. This includes oil or grease spills. What can I do in the bathroom? Use night lights. Install grab bars by the toilet and in the tub and shower. Do not use towel bars as grab bars. Use non-skid mats or  decals in the tub or shower. If you need to sit down in the shower, use a plastic, non-slip stool. Keep the floor dry. Clean up any water that spills on the floor as soon as it happens. Remove soap buildup in the tub or shower regularly. Attach bath mats securely with double-sided non-slip rug tape. Do not have throw rugs and other things on the floor that can make you trip. What can I do in the bedroom? Use night lights. Make sure that you have a light by your bed that is easy to reach. Do not use any sheets or blankets that are too big for your bed. They should not hang down onto the floor. Have a firm chair that has side arms. You can use this for support while you get dressed. Do not have throw rugs and other things on the floor that can make you trip. What can I do in the kitchen? Clean up any spills right away. Avoid walking on wet floors. Keep items that you use a lot in easy-to-reach places. If you need to reach something above you, use a strong step stool that has a grab bar. Keep electrical cords out of the way. Do not use floor polish or wax that makes floors slippery. If you must use wax, use non-skid floor wax. Do not have throw rugs and other things on the floor that can make you trip. What   can I do with my stairs? Do not leave any items on the stairs. Make sure that there are handrails on both sides of the stairs and use them. Fix handrails that are broken or loose. Make sure that handrails are as long as the stairways. Check any carpeting to make sure that it is firmly attached to the stairs. Fix any carpet that is loose or worn. Avoid having throw rugs at the top or bottom of the stairs. If you do have throw rugs, attach them to the floor with carpet tape. Make sure that you have a light switch at the top of the stairs and the bottom of the stairs. If you do not have them, ask someone to add them for you. What else can I do to help prevent falls? Wear shoes that: Do not  have high heels. Have rubber bottoms. Are comfortable and fit you well. Are closed at the toe. Do not wear sandals. If you use a stepladder: Make sure that it is fully opened. Do not climb a closed stepladder. Make sure that both sides of the stepladder are locked into place. Ask someone to hold it for you, if possible. Clearly mark and make sure that you can see: Any grab bars or handrails. First and last steps. Where the edge of each step is. Use tools that help you move around (mobility aids) if they are needed. These include: Canes. Walkers. Scooters. Crutches. Turn on the lights when you go into a dark area. Replace any light bulbs as soon as they burn out. Set up your furniture so you have a clear path. Avoid moving your furniture around. If any of your floors are uneven, fix them. If there are any pets around you, be aware of where they are. Review your medicines with your doctor. Some medicines can make you feel dizzy. This can increase your chance of falling. Ask your doctor what other things that you can do to help prevent falls. This information is not intended to replace advice given to you by your health care provider. Make sure you discuss any questions you have with your health care provider. Document Released: 02/24/2009 Document Revised: 10/06/2015 Document Reviewed: 06/04/2014 Elsevier Interactive Patient Education  2017 [employer]vier Inc.  

## 2021-12-15 ENCOUNTER — Inpatient Hospital Stay: Payer: Medicare Other

## 2021-12-22 ENCOUNTER — Inpatient Hospital Stay: Payer: Medicare Other | Attending: Hematology

## 2021-12-22 VITALS — BP 109/84 | HR 92 | Temp 97.6°F | Resp 18

## 2021-12-22 DIAGNOSIS — D500 Iron deficiency anemia secondary to blood loss (chronic): Secondary | ICD-10-CM

## 2021-12-22 DIAGNOSIS — D62 Acute posthemorrhagic anemia: Secondary | ICD-10-CM

## 2021-12-22 DIAGNOSIS — D509 Iron deficiency anemia, unspecified: Secondary | ICD-10-CM | POA: Insufficient documentation

## 2021-12-22 MED ORDER — LORATADINE 10 MG PO TABS
10.0000 mg | ORAL_TABLET | Freq: Once | ORAL | Status: AC
  Administered 2021-12-22: 10 mg via ORAL

## 2021-12-22 MED ORDER — ACETAMINOPHEN 325 MG PO TABS
650.0000 mg | ORAL_TABLET | Freq: Once | ORAL | Status: AC
  Administered 2021-12-22: 650 mg via ORAL

## 2021-12-22 MED ORDER — FAMOTIDINE IN NACL 20-0.9 MG/50ML-% IV SOLN
20.0000 mg | Freq: Once | INTRAVENOUS | Status: AC
  Administered 2021-12-22: 20 mg via INTRAVENOUS

## 2021-12-22 MED ORDER — SODIUM CHLORIDE 0.9 % IV SOLN: Freq: Once | INTRAVENOUS | Status: AC

## 2021-12-22 MED ORDER — METHYLPREDNISOLONE SODIUM SUCC 125 MG IJ SOLR
125.0000 mg | Freq: Once | INTRAMUSCULAR | Status: AC
  Administered 2021-12-22: 125 mg via INTRAVENOUS

## 2021-12-22 MED ORDER — SODIUM CHLORIDE 0.9 % IV SOLN
510.0000 mg | Freq: Once | INTRAVENOUS | Status: AC
  Administered 2021-12-22: 510 mg via INTRAVENOUS
  Filled 2021-12-22: qty 510

## 2021-12-22 NOTE — Progress Notes (Signed)
Patient tolerated iron infusion with no complaints voiced.  Peripheral IV site clean and dry with good blood return noted before and after infusion.  Band aid applied.  VSS with discharge and left in satisfactory condition with no s/s of distress noted.   

## 2021-12-22 NOTE — Patient Instructions (Signed)
[facility]  Discharge Instructions: Thank you for choosing [facility] to provide your oncology and hematology care.  If you have a lab appointment with the [facility], please come in thru the Main Entrance and check in at the main information desk.  Wear comfortable clothing and clothing appropriate for easy access to any Portacath or PICC line.   We strive to give you quality time with your provider. You may need to reschedule your appointment if you arrive late (15 or more minutes).  Arriving late affects you and other patients whose appointments are after yours.  Also, if you miss three or more appointments without notifying the office, you may be dismissed from the clinic at the provider's discretion.      For prescription refill requests, have your pharmacy contact our office and allow 72 hours for refills to be completed.    Today you received the following chemotherapy and/or immunotherapy agents ferahame Ferumoxytol Injection What is this medication? FERUMOXYTOL (FER ue MOX i tol) treats low levels of iron in your body (iron deficiency anemia). Iron is a mineral that plays an important role in making red blood cells, which carry oxygen from your lungs to the rest of your body. This medicine may be used for other purposes; ask your health care provider or pharmacist if you have questions. COMMON BRAND NAME(S): Feraheme What should I tell my care team before I take this medication? They need to know if you have any of these conditions: Anemia not caused by low iron levels High levels of iron in the blood Magnetic resonance imaging (MRI) test scheduled An unusual or allergic reaction to iron, other medications, foods, dyes, or preservatives Pregnant or trying to get pregnant Breast-feeding How should I use this medication? This medication is for injection into a vein. It is given in a hospital or clinic setting. Talk to your care team the use of  this medication in children. Special care may be needed. Overdosage: If you think you have taken too much of this medicine contact a poison control center or emergency room at once. NOTE: This medicine is only for you. Do not share this medicine with others. What if I miss a dose? It is important not to miss your dose. Call your care team if you are unable to keep an appointment. What may interact with this medication? Other iron products This list may not describe all possible interactions. Give your health care provider a list of all the medicines, herbs, non-prescription drugs, or dietary supplements you use. Also tell them if you smoke, drink alcohol, or use illegal drugs. Some items may interact with your medicine. What should I watch for while using this medication? Visit your care team regularly. Tell your care team if your symptoms do not start to get better or if they get worse. You may need blood work done while you are taking this medication. You may need to follow a special diet. Talk to your care team. Foods that contain iron include: whole grains/cereals, dried fruits, beans, or peas, leafy green vegetables, and organ meats (liver, kidney). What side effects may I notice from receiving this medication? Side effects that you should report to your care team as soon as possible: Allergic reactions--skin rash, itching, hives, swelling of the face, lips, tongue, or throat Low blood pressure--dizziness, feeling faint or lightheaded, blurry vision Shortness of breath Side effects that usually do not require medical attention (report to your care team if  they continue or are bothersome): Flushing Headache Joint pain Muscle pain Nausea Pain, redness, or irritation at injection site This list may not describe all possible side effects. Call your doctor for medical advice about side effects. You may report side effects to FDA at 1-800-FDA-1088. Where should I keep my medication? This  medication is given in a hospital or clinic and will not be stored at home. NOTE: This sheet is a summary. It may not cover all possible information. If you have questions about this medicine, talk to your doctor, pharmacist, or health care provider.  2023 Elsevier/Gold Standard (2020-09-23 00:00:00)       To help prevent nausea and vomiting after your treatment, we encourage you to take your nausea medication as directed.  BELOW ARE SYMPTOMS THAT SHOULD BE REPORTED IMMEDIATELY: *FEVER GREATER THAN 100.4 F (38 C) OR HIGHER *CHILLS OR SWEATING *NAUSEA AND VOMITING THAT IS NOT CONTROLLED WITH YOUR NAUSEA MEDICATION *UNUSUAL SHORTNESS OF BREATH *UNUSUAL BRUISING OR BLEEDING *URINARY PROBLEMS (pain or burning when urinating, or frequent urination) *BOWEL PROBLEMS (unusual diarrhea, constipation, pain near the anus) TENDERNESS IN MOUTH AND THROAT WITH OR WITHOUT PRESENCE OF ULCERS (sore throat, sores in mouth, or a toothache) UNUSUAL RASH, SWELLING OR PAIN  UNUSUAL VAGINAL DISCHARGE OR ITCHING   Items with * indicate a potential emergency and should be followed up as soon as possible or go to the Emergency Department if any problems should occur.  Please show the CHEMOTHERAPY ALERT CARD or IMMUNOTHERAPY ALERT CARD at check-in to the Emergency Department and triage nurse.  Should you have questions after your visit or need to cancel or reschedule your appointment, please contact [facility] [phone]  and follow the prompts.  Office hours are 8:00 a.m. to 4:30 p.m. Monday - Friday. Please note that voicemails left after 4:00 p.m. may not be returned until the following business day.  We are closed weekends and major holidays. You have access to a nurse at all times for urgent questions. Please call the main number to the clinic [phone] and follow the prompts.  For any non-urgent questions, you may also contact your provider using MyChart. We now offer e-Visits for  anyone 5 and older to request care online for non-urgent symptoms. For details visit [email or web address].   Also download the MyChart app! Go to the app store, search "MyChart", open the app, select [facility], and log in with your MyChart username and password.  Masks are optional in the cancer centers. If you would like for your care team to wear a mask while they are taking care of you, please let them know. For doctor visits, patients may have with them one support person who is at least 53 years old. At this time, visitors are not allowed in the infusion area.

## 2022-01-29 ENCOUNTER — Other Ambulatory Visit: Payer: Self-pay | Admitting: Family

## 2022-01-29 DIAGNOSIS — I10 Essential (primary) hypertension: Secondary | ICD-10-CM

## 2022-03-01 ENCOUNTER — Other Ambulatory Visit: Payer: Self-pay | Admitting: Family Medicine

## 2022-03-01 ENCOUNTER — Telehealth: Payer: Self-pay | Admitting: Family

## 2022-03-01 DIAGNOSIS — I10 Essential (primary) hypertension: Secondary | ICD-10-CM

## 2022-03-01 NOTE — Telephone Encounter (Signed)
Patient aware.

## 2022-03-01 NOTE — Telephone Encounter (Signed)
Pt needs to be seen or she can post-pone. She can call GI and ask if she can get her labs drawn on 03/06/22.

## 2022-03-02 ENCOUNTER — Inpatient Hospital Stay: Payer: Medicare Other | Attending: Hematology

## 2022-03-02 DIAGNOSIS — D649 Anemia, unspecified: Secondary | ICD-10-CM

## 2022-03-02 DIAGNOSIS — D509 Iron deficiency anemia, unspecified: Secondary | ICD-10-CM | POA: Insufficient documentation

## 2022-03-02 DIAGNOSIS — D500 Iron deficiency anemia secondary to blood loss (chronic): Secondary | ICD-10-CM

## 2022-03-02 LAB — COMPREHENSIVE METABOLIC PANEL
ALT: 17 U/L (ref 0–44)
AST: 14 U/L — ABNORMAL LOW (ref 15–41)
Albumin: 4.2 g/dL (ref 3.5–5.0)
Alkaline Phosphatase: 69 U/L (ref 38–126)
Anion gap: 9 (ref 5–15)
BUN: 11 mg/dL (ref 6–20)
CO2: 27 mmol/L (ref 22–32)
Calcium: 9.8 mg/dL (ref 8.9–10.3)
Chloride: 104 mmol/L (ref 98–111)
Creatinine, Ser: 0.74 mg/dL (ref 0.44–1.00)
GFR, Estimated: >60 mL/min (ref >60)
Glucose, Bld: 95 mg/dL (ref 70–99)
Potassium: 3.7 mmol/L (ref 3.5–5.1)
Sodium: 140 mmol/L (ref 135–145)
Total Bilirubin: 0.4 mg/dL (ref 0.3–1.2)
Total Protein: 7.5 g/dL (ref 6.5–8.1)

## 2022-03-02 LAB — CBC WITH DIFFERENTIAL/PLATELET
Abs Immature Granulocytes: 0.05 10*3/uL (ref 0.00–0.07)
Basophils Absolute: 0 10*3/uL (ref 0.0–0.1)
Basophils Relative: 0 %
Eosinophils Absolute: 0.1 10*3/uL (ref 0.0–0.5)
Eosinophils Relative: 1 %
HCT: 27.8 % — ABNORMAL LOW (ref 36.0–46.0)
Hemoglobin: 8.7 g/dL — ABNORMAL LOW (ref 12.0–15.0)
Immature Granulocytes: 1 %
Lymphocytes Relative: 30 %
Lymphs Abs: 1.8 10*3/uL (ref 0.7–4.0)
MCH: 29.2 pg (ref 26.0–34.0)
MCHC: 31.3 g/dL (ref 30.0–36.0)
MCV: 93.3 fL (ref 80.0–100.0)
Monocytes Absolute: 0.4 10*3/uL (ref 0.1–1.0)
Monocytes Relative: 7 %
Neutro Abs: 3.6 10*3/uL (ref 1.7–7.7)
Neutrophils Relative %: 61 %
Platelets: 240 10*3/uL (ref 150–400)
RBC: 2.98 MIL/uL — ABNORMAL LOW (ref 3.87–5.11)
RDW: 12.9 % (ref 11.5–15.5)
WBC: 6 10*3/uL (ref 4.0–10.5)
nRBC: 0 % (ref 0.0–0.2)

## 2022-03-02 LAB — FOLATE: Folate: 7.5 ng/mL (ref >5.9)

## 2022-03-02 LAB — FERRITIN: Ferritin: 12 ng/mL (ref 11–307)

## 2022-03-02 LAB — LACTATE DEHYDROGENASE: LDH: 121 U/L (ref 98–192)

## 2022-03-02 LAB — IRON AND TIBC
Iron: 27 ug/dL — ABNORMAL LOW (ref 28–170)
Saturation Ratios: 6 % — ABNORMAL LOW (ref 10.4–31.8)
TIBC: 420 ug/dL (ref 250–450)
UIBC: 393 ug/dL

## 2022-03-02 LAB — VITAMIN B12: Vitamin B-12: 263 pg/mL (ref 180–914)

## 2022-03-04 LAB — HAPTOGLOBIN: Haptoglobin: 201 mg/dL (ref 33–346)

## 2022-03-05 ENCOUNTER — Other Ambulatory Visit: Payer: Self-pay | Admitting: Physician Assistant

## 2022-03-05 LAB — METHYLMALONIC ACID, SERUM: Methylmalonic Acid, Quantitative: 177 nmol/L (ref 0–378)

## 2022-03-05 NOTE — Progress Notes (Signed)
Patient scheduled for follow-up with me on 03/19/2022, but blood and iron levels are quite low.  We will go ahead and schedule her for IV Feraheme as soon as possible.

## 2022-03-06 ENCOUNTER — Ambulatory Visit (INDEPENDENT_AMBULATORY_CARE_PROVIDER_SITE_OTHER): Payer: Medicare Other | Admitting: Family

## 2022-03-06 ENCOUNTER — Encounter: Payer: Self-pay | Admitting: Family

## 2022-03-06 VITALS — BP 112/72 | HR 110 | Temp 97.8°F | Ht 66.0 in | Wt 210.0 lb

## 2022-03-06 DIAGNOSIS — E559 Vitamin D deficiency, unspecified: Secondary | ICD-10-CM

## 2022-03-06 DIAGNOSIS — M159 Polyosteoarthritis, unspecified: Secondary | ICD-10-CM

## 2022-03-06 DIAGNOSIS — I10 Essential (primary) hypertension: Secondary | ICD-10-CM | POA: Diagnosis not present

## 2022-03-06 DIAGNOSIS — Z23 Encounter for immunization: Secondary | ICD-10-CM | POA: Diagnosis not present

## 2022-03-06 DIAGNOSIS — K219 Gastro-esophageal reflux disease without esophagitis: Secondary | ICD-10-CM

## 2022-03-06 DIAGNOSIS — D649 Anemia, unspecified: Secondary | ICD-10-CM | POA: Diagnosis not present

## 2022-03-06 DIAGNOSIS — R479 Unspecified speech disturbances: Secondary | ICD-10-CM

## 2022-03-06 DIAGNOSIS — F411 Generalized anxiety disorder: Secondary | ICD-10-CM | POA: Diagnosis not present

## 2022-03-06 DIAGNOSIS — K279 Peptic ulcer, site unspecified, unspecified as acute or chronic, without hemorrhage or perforation: Secondary | ICD-10-CM | POA: Diagnosis not present

## 2022-03-06 DIAGNOSIS — D500 Iron deficiency anemia secondary to blood loss (chronic): Secondary | ICD-10-CM | POA: Diagnosis not present

## 2022-03-06 DIAGNOSIS — G4700 Insomnia, unspecified: Secondary | ICD-10-CM | POA: Diagnosis not present

## 2022-03-06 DIAGNOSIS — F321 Major depressive disorder, single episode, moderate: Secondary | ICD-10-CM | POA: Diagnosis not present

## 2022-03-06 LAB — PROTEIN ELECTROPHORESIS, SERUM
A/G Ratio: 1.3 (ref 0.7–1.7)
Albumin ELP: 3.9 g/dL (ref 2.9–4.4)
Alpha-1-Globulin: 0.3 g/dL (ref 0.0–0.4)
Alpha-2-Globulin: 0.8 g/dL (ref 0.4–1.0)
Beta Globulin: 1 g/dL (ref 0.7–1.3)
Gamma Globulin: 1 g/dL (ref 0.4–1.8)
Globulin, Total: 3 g/dL (ref 2.2–3.9)
Total Protein ELP: 6.9 g/dL (ref 6.0–8.5)

## 2022-03-06 MED ORDER — AMLODIPINE BESYLATE 10 MG PO TABS: 10.0000 mg | ORAL_TABLET | Freq: Every day | ORAL | 2 refills | Status: DC

## 2022-03-06 MED ORDER — ESCITALOPRAM OXALATE 20 MG PO TABS: 20.0000 mg | ORAL_TABLET | Freq: Every day | ORAL | 1 refills | Status: DC

## 2022-03-06 MED ORDER — BUSPIRONE HCL 5 MG PO TABS: ORAL_TABLET | ORAL | 1 refills | Status: DC

## 2022-03-06 MED ORDER — LISINOPRIL 20 MG PO TABS: 20.0000 mg | ORAL_TABLET | Freq: Every day | ORAL | 2 refills | Status: DC

## 2022-03-06 MED ORDER — PANTOPRAZOLE SODIUM 40 MG PO TBEC: DELAYED_RELEASE_TABLET | ORAL | 2 refills | Status: DC

## 2022-03-06 NOTE — Progress Notes (Signed)
Subjective:    Patient ID: Brittany Durham, female    DOB: 1970/07/14, 51 y.o.   MRN: [id number]  Chief Complaint  Patient presents with   Medical Management of Chronic Issues    Flu shot today    PT presents to the office today for chronic follow up. Pt is followed by Neurologists for Speech difficulties and seizures.   She is followed by GI every 6 months for GERD and diverticulitis and right flank pain. She had ar Cholecystectomy on 02/23/21. Since then her flank pain has improved.    She is followed by Hematologists for iron deficiency and getting iron infusions.   Hypertension This is a chronic problem. The current episode started more than 1 year ago. Associated symptoms include anxiety. Pertinent negatives include no malaise/fatigue, peripheral edema or shortness of breath. Risk factors for coronary artery disease include dyslipidemia, obesity and sedentary lifestyle. The current treatment provides moderate improvement.  Gastroesophageal Reflux She complains of belching and heartburn. This is a chronic problem. The current episode started more than 1 year ago. The problem occurs occasionally. She has tried a PPI for the symptoms. The treatment provided moderate relief.  Arthritis Presents for follow-up visit. She complains of pain and stiffness. The symptoms have been resolved. Affected locations include the left knee and right knee. Her pain is at a severity of 0/10.  Anemia Presents for follow-up visit. There has been no malaise/fatigue.  Anxiety Presents for follow-up visit. Symptoms include insomnia. Patient reports no excessive worry, irritability, nervous/anxious behavior or shortness of breath. Symptoms occur rarely. The severity of symptoms is mild.   Her past medical history is significant for anemia.  Depression        This is a chronic problem.  The current episode started more than 1 year ago.   Associated symptoms include insomnia.  Associated symptoms include no  helplessness, no hopelessness and not sad.  Past treatments include SSRIs - Selective serotonin reuptake inhibitors.  Past medical history includes anxiety.   Insomnia Primary symptoms: difficulty falling asleep, frequent awakening, no malaise/fatigue.   The current episode started more than one year. The onset quality is gradual. The problem occurs intermittently. PMH includes: depression.       Review of Systems  Constitutional:  Negative for irritability and malaise/fatigue.  Respiratory:  Negative for shortness of breath.   Gastrointestinal:  Positive for heartburn.  Musculoskeletal:  Positive for arthritis and stiffness.  Psychiatric/Behavioral:  Positive for depression. The patient has insomnia. The patient is not nervous/anxious.   All other systems reviewed and are negative.      Objective:   Physical Exam Vitals reviewed.  Constitutional:      General: She is not in acute distress.    Appearance: She is well-developed. She is obese.  HENT:     Head: Normocephalic and atraumatic.     Right Ear: Tympanic membrane normal.     Left Ear: Tympanic membrane normal.  Eyes:     Pupils: Pupils are equal, round, and reactive to light.  Neck:     Thyroid: No thyromegaly.  Cardiovascular:     Rate and Rhythm: Normal rate and regular rhythm.     Heart sounds: Normal heart sounds. No murmur heard. Pulmonary:     Effort: Pulmonary effort is normal. No respiratory distress.     Breath sounds: Normal breath sounds. No wheezing.  Abdominal:     General: Bowel sounds are normal. There is no distension.     Palpations: Abdomen  is soft.     Tenderness: There is no abdominal tenderness.  Musculoskeletal:        General: No tenderness. Normal range of motion.     Cervical back: Normal range of motion and neck supple.  Skin:    General: Skin is warm and dry.  Neurological:     Mental Status: She is alert and oriented to person, place, and time.     Cranial Nerves: No cranial nerve  deficit.     Deep Tendon Reflexes: Reflexes are normal and symmetric.  Psychiatric:        Behavior: Behavior normal.        Thought Content: Thought content normal.        Judgment: Judgment normal.       BP 112/72   Pulse (!) 110   Temp 97.8 F (36.6 C) (Temporal)   Ht '5\' 6"'$  (1.676 m)   Wt 210 lb (95.3 kg)   LMP 09/16/2017   SpO2 97%   BMI 33.89 kg/m      Assessment & Plan:  Melodee Lupe comes in today with chief complaint of Medical Management of Chronic Issues (Flu shot today )   Diagnosis and orders addressed:  1. GAD (generalized anxiety disorder) - busPIRone (BUSPAR) 5 MG tablet; TAKE ONE TABLET BY MOUTH UP TO THREE TIMES DAILY.  Dispense: 90 tablet; Refill: 1 - escitalopram (LEXAPRO) 20 MG tablet; Take 1 tablet (20 mg total) by mouth daily.  Dispense: 180 tablet; Refill: 1  2. HYPERTENSION, BENIGN - amLODipine (NORVASC) 10 MG tablet; Take 1 tablet (10 mg total) by mouth daily. (NEEDS TO BE SEEN BEFORE NEXT REFILL)  Dispense: 90 tablet; Refill: 2 - lisinopril (ZESTRIL) 20 MG tablet; Take 1 tablet (20 mg total) by mouth daily. (NEEDS TO BE SEEN BEFORE NEXT REFILL)  Dispense: 90 tablet; Refill: 2  3. PUD (peptic ulcer disease)  - pantoprazole (PROTONIX) 40 MG tablet; TAKE 1 TABLET ONCE DAILY BEFORE BREAKFAST  Dispense: 90 tablet; Refill: 2  4. Gastroesophageal reflux disease without esophagitis  5. Primary osteoarthritis involving multiple joints  6. Symptomatic anemia   7. Vitamin D deficiency  8. Speech disorder   9. Insomnia, unspecified type  10. Iron deficiency anemia due to chronic blood loss  11. Depression, major, single episode, moderate (HCC) - escitalopram (LEXAPRO) 20 MG tablet; Take 1 tablet (20 mg total) by mouth daily.  Dispense: 180 tablet; Refill: 1   Labs pending Health Maintenance reviewed Diet and exercise encouraged  Follow up plan: 6 months    Evelina Dun, FNP

## 2022-03-06 NOTE — Patient Instructions (Signed)
Iron Deficiency Anemia, Adult  Iron deficiency anemia is a condition in which the concentration of red blood cells or hemoglobin in the blood is below normal because of too little iron. Hemoglobin is a substance in red blood cells that carries oxygen to the body's tissues. When the concentration of red blood cells or hemoglobin is too low, not enough oxygen reaches these tissues. Iron deficiency anemia is usually long-lasting, and it develops over time. It may or may not cause symptoms. It is a common type of anemia. What are the causes? This condition may be caused by: Not enough iron in the diet. Abnormal absorption in the gut. Blood loss. What increases the risk? You are more likely to develop this condition if you get menstrual periods (menstruate) or are pregnant. What are the signs or symptoms? Symptoms of this condition may include: Pale skin, lips, and nail beds. Weakness, dizziness, and getting tired easily. Shortness of breath when moving or exercising. Cold hands or feet. Mild anemia may not cause any symptoms. How is this diagnosed? This condition is diagnosed based on: Your medical history. A physical exam. Blood tests. How is this treated? This condition is treated by correcting the cause of your iron deficiency. Treatment may involve: Adding iron-rich foods to your diet. Taking iron supplements. If you are pregnant or breastfeeding, you may need to take extra iron because your normal diet usually does not provide the amount of iron that you need. Increasing vitamin C intake. Vitamin C helps your body absorb iron. Your health care provider may recommend that you take iron supplements along with a glass of orange juice or a vitamin C supplement. Medicines to make heavy menstrual flow lighter. Surgery or additional testing procedures to determine the cause of your anemia. You may need repeat blood tests to determine whether treatment is working. If the treatment does not  seem to be working, you may need more tests. Follow these instructions at home: Medicines Take over-the-counter and prescription medicines only as told by your health care provider. This includes iron supplements and vitamins. This is important because too much iron can be harmful. For the best iron absorption, you should take iron supplements when your stomach is empty. If you cannot tolerate them on an empty stomach, you may need to take them with food. Do not drink milk or take antacids at the same time as your iron supplements. Milk and antacids may interfere with how your body absorbs iron. Iron supplements may turn stool (feces) a darker color and it may appear black. If you cannot tolerate taking iron supplements by mouth, talk with your health care provider about taking them through an IV or through an injection into a muscle. Eating and drinking Talk with your health care provider before changing your diet. Your provider may recommend that you eat foods that contain a lot of iron, such as: Liver. Low-fat (lean) beef. Breads and cereals that have iron added to them (are fortified). Eggs. Dried fruit. Dark green, leafy vegetables. To help your body use the iron from iron-rich foods, eat those foods at the same time as fresh fruits and vegetables that are high in vitamin C. Foods that are high in vitamin C include: Oranges. Peppers. Tomatoes. Mangoes. Managing constipation If you are taking an iron supplement, it may cause constipation. To prevent or treat constipation, you may need to: Drink enough fluid to keep your urine pale yellow. Take over-the-counter or prescription medicines. Eat foods that are high in fiber, such   as beans, whole grains, and fresh fruits and vegetables. Limit foods that are high in fat and processed sugars, such as fried or sweet foods. General instructions Return to your normal activities as told by your health care provider. Ask your health care provider  what activities are safe for you. Keep all follow-up visits. Contact a health care provider if: You feel nauseous or you vomit. You feel weak. You become light-headed when getting up from a sitting or lying down position. You have unexplained sweating. You develop symptoms of constipation. You have a heaviness in your chest. You have trouble breathing with physical activity. Get help right away if: You faint. If this happens, do not drive yourself to the hospital. You have an irregular or rapid heartbeat. Summary Iron deficiency anemia is a condition in which the concentration of red blood cells or hemoglobin in the blood is below normal because of too little iron. This condition is treated by correcting the cause of your iron deficiency. Take over-the-counter and prescription medicines only as told by your health care provider. This includes iron supplements and vitamins. To help your body use the iron from iron-rich foods, eat those foods at the same time as fresh fruits and vegetables that are high in vitamin C. Seek medical help if you have signs or symptoms of worsening anemia. This information is not intended to replace advice given to you by your health care provider. Make sure you discuss any questions you have with your health care provider. Document Revised: 06/07/2021 Document Reviewed: 06/07/2021 Elsevier Patient Education  2023 Elsevier Inc.  

## 2022-03-09 ENCOUNTER — Ambulatory Visit: Payer: Medicare Other | Admitting: Physician Assistant

## 2022-03-12 ENCOUNTER — Inpatient Hospital Stay: Payer: Medicare Other

## 2022-03-12 VITALS — BP 111/66 | HR 97 | Temp 98.2°F | Resp 18

## 2022-03-12 DIAGNOSIS — D62 Acute posthemorrhagic anemia: Secondary | ICD-10-CM

## 2022-03-12 DIAGNOSIS — D500 Iron deficiency anemia secondary to blood loss (chronic): Secondary | ICD-10-CM

## 2022-03-12 DIAGNOSIS — D509 Iron deficiency anemia, unspecified: Secondary | ICD-10-CM | POA: Diagnosis not present

## 2022-03-12 MED ORDER — ACETAMINOPHEN 325 MG PO TABS
650.0000 mg | ORAL_TABLET | Freq: Once | ORAL | Status: DC
  Filled 2022-03-12: qty 2

## 2022-03-12 MED ORDER — LORATADINE 10 MG PO TABS
10.0000 mg | ORAL_TABLET | Freq: Once | ORAL | Status: AC
  Administered 2022-03-12: 10 mg via ORAL
  Filled 2022-03-12: qty 1

## 2022-03-12 MED ORDER — SODIUM CHLORIDE 0.9 % IV SOLN
510.0000 mg | Freq: Once | INTRAVENOUS | Status: AC
  Administered 2022-03-12: 510 mg via INTRAVENOUS
  Filled 2022-03-12: qty 510

## 2022-03-12 MED ORDER — METHYLPREDNISOLONE SODIUM SUCC 125 MG IJ SOLR
125.0000 mg | Freq: Once | INTRAMUSCULAR | Status: AC
  Administered 2022-03-12: 125 mg via INTRAVENOUS
  Filled 2022-03-12: qty 2

## 2022-03-12 MED ORDER — FAMOTIDINE IN NACL 20-0.9 MG/50ML-% IV SOLN
20.0000 mg | Freq: Once | INTRAVENOUS | Status: AC
  Administered 2022-03-12: 20 mg via INTRAVENOUS
  Filled 2022-03-12: qty 50

## 2022-03-12 MED ORDER — SODIUM CHLORIDE 0.9 % IV SOLN: Freq: Once | INTRAVENOUS | Status: AC

## 2022-03-12 NOTE — Patient Instructions (Signed)
[facility]  Discharge Instructions: Thank you for choosing [facility] to provide your oncology and hematology care.  If you have a lab appointment with the [facility], please come in thru the Main Entrance and check in at the main information desk.  Wear comfortable clothing and clothing appropriate for easy access to any Portacath or PICC line.   We strive to give you quality time with your provider. You may need to reschedule your appointment if you arrive late (15 or more minutes).  Arriving late affects you and other patients whose appointments are after yours.  Also, if you miss three or more appointments without notifying the office, you may be dismissed from the clinic at the provider's discretion.      For prescription refill requests, have your pharmacy contact our office and allow 72 hours for refills to be completed.    Today you received Feraheme IV iron.     BELOW ARE SYMPTOMS THAT SHOULD BE REPORTED IMMEDIATELY: *FEVER GREATER THAN 100.4 F (38 C) OR HIGHER *CHILLS OR SWEATING *NAUSEA AND VOMITING THAT IS NOT CONTROLLED WITH YOUR NAUSEA MEDICATION *UNUSUAL SHORTNESS OF BREATH *UNUSUAL BRUISING OR BLEEDING *URINARY PROBLEMS (pain or burning when urinating, or frequent urination) *BOWEL PROBLEMS (unusual diarrhea, constipation, pain near the anus) TENDERNESS IN MOUTH AND THROAT WITH OR WITHOUT PRESENCE OF ULCERS (sore throat, sores in mouth, or a toothache) UNUSUAL RASH, SWELLING OR PAIN  UNUSUAL VAGINAL DISCHARGE OR ITCHING   Items with * indicate a potential emergency and should be followed up as soon as possible or go to the Emergency Department if any problems should occur.  Please show the CHEMOTHERAPY ALERT CARD or IMMUNOTHERAPY ALERT CARD at check-in to the Emergency Department and triage nurse.  Should you have questions after your visit or need to cancel or reschedule your appointment, please contact [facility] [phone]  and follow the prompts.  Office hours are 8:00 a.m. to 4:30 p.m. Monday - Friday. Please note that voicemails left after 4:00 p.m. may not be returned until the following business day.  We are closed weekends and major holidays. You have access to a nurse at all times for urgent questions. Please call the main number to the clinic [phone] and follow the prompts.  For any non-urgent questions, you may also contact your provider using MyChart. We now offer e-Visits for anyone 84 and older to request care online for non-urgent symptoms. For details visit [email or web address].   Also download the MyChart app! Go to the app store, search "MyChart", open the app, select [facility], and log in with your MyChart username and password.  Masks are optional in the cancer centers. If you would like for your care team to wear a mask while they are taking care of you, please let them know. You may have one support person who is at least 53 years old accompany you for your appointments.

## 2022-03-12 NOTE — Progress Notes (Signed)
Pt presents today for Feraheme IV iron infusion per provider's order. Vital signs stable, pt c/o weakness today. Pt stated her hemoglobin was 8.7 last week at her PCP office visit. Pt is scheduled for a colonoscopy and EGD in November. Pt wanted to know if it was okay to proceed with iron infusion. Message sent to [facility] and she stated it was okay to proceed with iron infusion, she stated it should boost energy and blood levels. Pt made aware and verbalized understanding.   Peripheral IV started with good blood return pre and post infusion.  Feraheme given today per MD orders. Tolerated infusion without adverse affects. Vital signs stable. No complaints at this time. Discharged from clinic ambulatory in stable condition. Alert and oriented x 3. F/U with [facility] as scheduled.

## 2022-03-16 NOTE — Progress Notes (Unsigned)
[place], [place] [address]   CLINIC:  Medical Oncology/Hematology  PCP:  Sharion Balloon, [place] [address] [phone]   REASON FOR VISIT:  Follow-up for iron deficiency anemia   CURRENT THERAPY: Intermittent IV iron )   INTERVAL HISTORY:  Brittany Durham 51 y.o. female returns for routine follow-up of her iron deficiency anemia.  She was last seen by Tarri Abernethy PA-C on 11/30/2021.  Since her last visit, patient reports that she has had decreased appetite and has been losing weight.  She also reports polydipsia and polyuria.  She had prior hypersensitivity reaction to IV Venofer, recently received Feraheme on 03/12/2022 with steroid premedications, and tolerated this well.    She continues to have intermittent rectal bleeding, last episode 2 to 3 weeks ago.  She also reports intermittent "blackish" watery stools, but is not currently on iron supplementation.  She denies any hematemesis or epistaxis.  She has not had any further vaginal bleeding since her isolated episode in late 2022.  She continues to feel fatigued, with energy about 70%  She  denies any shortness of breath with exertion. She denies any pica, restless legs, headaches, chest pain, lightheadedness, syncope. She has 60% appetite.    REVIEW OF SYSTEMS:  Review of Systems  Constitutional:  Positive for fatigue. Negative for appetite change, chills, diaphoresis, fever and unexpected weight change.  HENT:   Negative for lump/mass and nosebleeds.   Eyes:  Negative for eye problems.  Respiratory:  Negative for cough, hemoptysis and shortness of breath.   Cardiovascular:  Negative for chest pain, leg swelling and palpitations.  Gastrointestinal:  Positive for blood in stool (Intermittent rectal bleeding, possible melena) and diarrhea. Negative for abdominal pain, constipation, nausea and vomiting.  Genitourinary:  Negative for hematuria.    Musculoskeletal:  Positive for arthralgias (arthritis, right knee gout).  Skin: Negative.   Neurological:  Negative for dizziness, headaches and light-headedness.  Hematological:  Does not bruise/bleed easily.  Psychiatric/Behavioral:  Positive for sleep disturbance.       PAST MEDICAL/SURGICAL HISTORY:  Past Medical History:  Diagnosis Date   Anemia    Asthma    childhood   Carotidynia    COPD (chronic obstructive pulmonary disease) ([facility])    DDD (degenerative disc disease), cervical    GERD (gastroesophageal reflux disease)    Hypertension    Knee pain    left meniscus tear s/p repair 11/2014   Seizures ([facility])    last seizure was over 10 years ago, unknown etiology   Stuttering    patient reports this started after her knee surgery in 11/2014.    Thyroid nodule    Past Surgical History:  Procedure Laterality Date   ANTERIOR CERVICAL DECOMP/DISCECTOMY FUSION N/A 03/20/2019   Procedure: Anterior Cervical Decompression Fusion - Cervical Four-Cervical Five - Cervical Five-Cervical Six;  Surgeon: Eustace Moore, MD;  Location: [facility];  Service: Neurosurgery;  Laterality: N/A;  Anterior Cervical Decompression Fusion - Cervical Four-Cervical Five - Cervical Five-Cervical Six   BIOPSY  09/20/2020   Procedure: BIOPSY;  Surgeon: Daneil Dolin, MD;  [facility];  Service: Endoscopy;;   COLONOSCOPY N/A 08/23/2016   Procedure: COLONOSCOPY;  Surgeon: Daneil Dolin, MD;  [facility];  Service: Endoscopy;  Laterality: N/A;  1:00pm   COLONOSCOPY N/A 08/24/2020   Diverticulosis in the entire examined colon. Redundant colon. Exam otherwise normal   ESOPHAGOGASTRODUODENOSCOPY N/A 05/03/2016  Procedure: ESOPHAGOGASTRODUODENOSCOPY (EGD);  Surgeon: Daneil Dolin, MD;  [facility];  Service: Endoscopy;  Laterality: N/A;   ESOPHAGOGASTRODUODENOSCOPY N/A 08/23/2016   Procedure: ESOPHAGOGASTRODUODENOSCOPY (EGD);  Surgeon: Daneil Dolin, MD;  [facility];  Service: Endoscopy;  Laterality: N/A;   ESOPHAGOGASTRODUODENOSCOPY (EGD) WITH PROPOFOL N/A 09/20/2020   normal esophagus. Large hiatal hernia. Multiple gastric erosions (Cameron lesions). Normal duodenal bulb and second portion of the duodenum. Suspicion Lysbeth Galas lesions causing intermittent bleeding   KNEE ARTHROSCOPY Right    KNEE ARTHROSCOPY WITH MEDIAL MENISECTOMY Left 12/09/2014   Procedure: KNEE ARTHROSCOPY WITH PARTIAL MEDIAL AND LATERAL MENISECTOMY;  Surgeon: Sanjuana Kava, MD;  Location: [facility];  Service: Orthopedics;  Laterality: Left;   TUBAL LIGATION     WISDOM TOOTH EXTRACTION       SOCIAL HISTORY:  Social History   Socioeconomic History   Marital status: Single    Spouse name: Not on file   Number of children: 1   Years of education: 12   Highest education level: Not on file  Occupational History   Occupation: Umemployed    Comment: Disability  Tobacco Use   Smoking status: Never   Smokeless tobacco: Never  Vaping Use   Vaping Use: Never used  Substance and Sexual Activity   Alcohol use: No   Drug use: No   Sexual activity: Not Currently    Birth control/protection: Surgical  Other Topics Concern   Not on file  Social History Narrative   Lives at home alone.   Disabled - speech difficulties, cannot drive   Right-handed.   Occasional caffeine use.   Her daughter lives about 30-45 minutes away   She has lots of family nearby - uncle, mother, sister, brother   Social Determinants of Health   Financial Resource Strain: [place]  (12/14/2021)   Overall Financial Resource Strain (CARDIA)    Difficulty of Paying Living Expenses: Not hard at all  Food Insecurity: No Food Insecurity (12/14/2021)   Hunger Vital Sign    Worried About Running Out of Food in the Last Year: Never true    [place] in the Last Year: Never true  Transportation Needs: No Transportation Needs (12/14/2021)   PRAPARE - Hydrologist (Medical): No     Lack of Transportation (Non-Medical): No  Physical Activity: Insufficiently Active (12/14/2021)   Exercise Vital Sign    Days of Exercise per Week: 2 days    Minutes of Exercise per Session: 20 min  Stress: No Stress Concern Present (12/14/2021)   [place]    Feeling of Stress : Not at all  Social Connections: Moderately Isolated (12/14/2021)   Social Connection and Isolation Panel [NHANES]    Frequency of Communication with Friends and Family: More than three times a week    Frequency of Social Gatherings with Friends and Family: Three times a week    Attends Religious Services: More than 4 times per year    Active Member of Clubs or Organizations: No    Attends Archivist Meetings: Never    Marital Status: Never married  Intimate Partner Violence: Not At Risk (12/14/2021)   Humiliation, Afraid, Rape, and Kick questionnaire    Fear of Current or Ex-Partner: No    Emotionally Abused: No    Physically Abused: No    Sexually Abused: No    FAMILY HISTORY:  Family History  Problem Relation  Age of Onset   Hypertension Mother    Lung cancer Father    Hypertension Brother    Colon cancer Neg Hx    Gastric cancer Neg Hx    Esophageal cancer Neg Hx     CURRENT MEDICATIONS:  Outpatient Encounter Medications as of 03/19/2022  Medication Sig   amLODipine (NORVASC) 10 MG tablet Take 1 tablet (10 mg total) by mouth daily. (NEEDS TO BE SEEN BEFORE NEXT REFILL)   busPIRone (BUSPAR) 5 MG tablet TAKE ONE TABLET BY MOUTH UP TO THREE TIMES DAILY.   escitalopram (LEXAPRO) 20 MG tablet Take 1 tablet (20 mg total) by mouth daily.   ferrous sulfate 325 (65 FE) MG tablet Take 1 tablet by mouth daily.   lisinopril (ZESTRIL) 20 MG tablet Take 1 tablet (20 mg total) by mouth daily. (NEEDS TO BE SEEN BEFORE NEXT REFILL)   pantoprazole (PROTONIX) 40 MG tablet TAKE 1 TABLET ONCE DAILY BEFORE BREAKFAST   No facility-administered  encounter medications on file as of 03/19/2022.    ALLERGIES:  Allergies  Allergen Reactions   Acetaminophen Other (See Comments)    MD requests not to be given   Venofer [Iron Sucrose] Rash     PHYSICAL EXAM:  ECOG PERFORMANCE STATUS: 1 - Symptomatic but completely ambulatory  There were no vitals filed for this visit. There were no vitals filed for this visit. Physical Exam Constitutional:      Appearance: Normal appearance. She is obese.  HENT:     Head: Normocephalic and atraumatic.     Mouth/Throat:     Mouth: Mucous membranes are moist.  Eyes:     Extraocular Movements: Extraocular movements intact.     Pupils: Pupils are equal, round, and reactive to light.  Cardiovascular:     Rate and Rhythm: Regular rhythm.     Pulses: Normal pulses.     Heart sounds: Normal heart sounds.  Pulmonary:     Effort: Pulmonary effort is normal.     Breath sounds: Normal breath sounds.  Abdominal:     General: Bowel sounds are normal.     Palpations: Abdomen is soft.     Tenderness: There is no abdominal tenderness.  Musculoskeletal:        General: No swelling.     Right lower leg: No edema.     Left lower leg: No edema.  Lymphadenopathy:     Cervical: No cervical adenopathy.  Skin:    General: Skin is warm and dry.  Neurological:     General: No focal deficit present.     Mental Status: She is alert and oriented to person, place, and time.  Psychiatric:        Mood and Affect: Mood normal.        Behavior: Behavior normal.    LABORATORY DATA:  I have reviewed the labs as listed.  CBC    Component Value Date/Time   WBC 6.0 03/02/2022 1114   RBC 2.98 (L) 03/02/2022 1114   HGB 8.7 (L) 03/02/2022 1114   HGB 12.6 10/24/2020 1318   HCT 27.8 (L) 03/02/2022 1114   HCT 39.0 10/24/2020 1318   PLT 240 03/02/2022 1114   PLT 277 10/24/2020 1318   MCV 93.3 03/02/2022 1114   MCV 88 10/24/2020 1318   MCH 29.2 03/02/2022 1114   MCHC 31.3 03/02/2022 1114   RDW 12.9 03/02/2022  1114   RDW 14.8 10/24/2020 1318   LYMPHSABS 1.8 03/02/2022 1114   LYMPHSABS 2.3 10/24/2020 1318  MONOABS 0.4 03/02/2022 1114   EOSABS 0.1 03/02/2022 1114   EOSABS 0.2 10/24/2020 1318   BASOSABS 0.0 03/02/2022 1114   BASOSABS 0.0 10/24/2020 1318      Latest Ref Rng & Units 03/02/2022   11:14 AM 08/10/2021   12:03 PM 12/16/2020   11:35 AM  CMP  Glucose 70 - 99 mg/dL 95  93  144   BUN 6 - 20 mg/dL '11  14  14   '$ Creatinine 0.44 - 1.00 mg/dL 0.74  0.91  0.69   Sodium 135 - 145 mmol/L 140  144  138   Potassium 3.5 - 5.1 mmol/L 3.7  4.2  3.3   Chloride 98 - 111 mmol/L 104  103  102   CO2 22 - 32 mmol/L '27  21  28   '$ Calcium 8.9 - 10.3 mg/dL 9.8  9.9  9.1   Total Protein 6.5 - 8.1 g/dL 7.5  7.4  7.5   Total Bilirubin 0.3 - 1.2 mg/dL 0.4  0.4  0.5   Alkaline Phos 38 - 126 U/L 69  108  82   AST 15 - 41 U/L '14  15  14   '$ ALT 0 - 44 U/L '17  16  16     '$ DIAGNOSTIC IMAGING:  I have independently reviewed the relevant imaging and discussed with the patient.  ASSESSMENT & PLAN: 1.  Iron deficiency anemia with suspected occult GI bleed - ED on 07/18/2020 with Hgb 5.7, fatigue, lightheadedness.  Denied overt GI bleeding and was Hemoccult-negative, but does have a history of peptic ulcer disease.  She was transfused 2 units PRBCs on 07/18/2020, and followed up with gastroenterology.  - Colonoscopy (08/24/2020) showed diverticulosis, but was otherwise normal. - EGD (09/20/2020): Large hiatal hernia, Cameron lesions and antral erosions (suspicious for causing intermittent bleeding in the setting of ongoing NSAID use per GI note) - She is on Protonix for suspected recurrent peptic ulcer disease - Hematology work-up (07/29/2020) showed Hgb 8.5 (microcytic with MCV 78.2).  Ferritin was 7, with 32% iron saturation.  Reticulocytes 3.4 (reticulocyte index 1.61, consistent with hypoproliferation in the setting of iron deficiency).   - Most recent IV iron with Feraheme x2 on 12/07/2021 and 12/22/2021 - Per outside  medical records, patient had mild postinfusion reaction with rash on bilateral arms after Venofer infusion on 09/29/2021. - Intermittent rectal bleeding and melanotic diarrhea - Follows with [facility] Brittany Alfred PA-C) - per recent telephone call, was recommended to move up her EGD/colonoscopy and to stop all NSAIDs (including diclofenac); EGD/colonoscopy scheduled for November 17 per patient report - Symptomatic with fatigue - Most recent labs (03/02/2022): Hgb 8.7/MCV 93.3, ferritin 12, iron saturation 6%.  CMP unremarkable, LDH normal.  Normal B12, MMA, folate, SPEP, haptoglobin. - PLAN: Recommend IV Feraheme x2 (received first dose on 03/12/2022).  We will switch to Feraheme and PREMEDICATE WITH STEROIDS due to possible reaction after most recent Venofer. - Continue GI and GYN follow-up.  Patient advised to contact GI regarding her rectal bleeding. - RTC in 2 months for repeat labs and follow-up.  We will also check CMP and nutritional panel with next labs.   2.  Other issues - Advised patient to speak with her PCP regarding symptoms of polydipsia and polyuria, as well as weight loss   PLAN SUMMARY & DISPOSITION: (Second dose to be received today) IV iron with Feraheme x2 Labs in 2 months Office visit 1 week after labs  All questions were answered. The patient  knows to call the clinic with any problems, questions or concerns.  Medical decision making: Low  Time spent on visit: I spent 15 minutes counseling the patient face to face. The total time spent in the appointment was 22 minutes and more than 50% was on counseling.   Harriett Rush, PA-C  03/19/22 12:39 PM

## 2022-03-19 ENCOUNTER — Inpatient Hospital Stay: Payer: Medicare Other | Attending: Hematology | Admitting: Physician Assistant

## 2022-03-19 ENCOUNTER — Inpatient Hospital Stay: Payer: Medicare Other

## 2022-03-19 VITALS — BP 108/81 | HR 100 | Temp 97.9°F | Resp 19 | Ht 67.0 in | Wt 205.7 lb

## 2022-03-19 VITALS — BP 108/72 | HR 93 | Temp 98.6°F | Resp 18

## 2022-03-19 DIAGNOSIS — Z79899 Other long term (current) drug therapy: Secondary | ICD-10-CM | POA: Insufficient documentation

## 2022-03-19 DIAGNOSIS — D500 Iron deficiency anemia secondary to blood loss (chronic): Secondary | ICD-10-CM

## 2022-03-19 DIAGNOSIS — D509 Iron deficiency anemia, unspecified: Secondary | ICD-10-CM | POA: Insufficient documentation

## 2022-03-19 DIAGNOSIS — D62 Acute posthemorrhagic anemia: Secondary | ICD-10-CM

## 2022-03-19 MED ORDER — SODIUM CHLORIDE 0.9 % IV SOLN
510.0000 mg | Freq: Once | INTRAVENOUS | Status: AC
  Administered 2022-03-19: 510 mg via INTRAVENOUS
  Filled 2022-03-19: qty 510

## 2022-03-19 MED ORDER — METHYLPREDNISOLONE SODIUM SUCC 125 MG IJ SOLR
125.0000 mg | Freq: Once | INTRAMUSCULAR | Status: AC
  Administered 2022-03-19: 125 mg via INTRAVENOUS
  Filled 2022-03-19: qty 2

## 2022-03-19 MED ORDER — FAMOTIDINE IN NACL 20-0.9 MG/50ML-% IV SOLN
20.0000 mg | Freq: Once | INTRAVENOUS | Status: AC
  Administered 2022-03-19: 20 mg via INTRAVENOUS
  Filled 2022-03-19: qty 50

## 2022-03-19 MED ORDER — ACETAMINOPHEN 325 MG PO TABS
650.0000 mg | ORAL_TABLET | Freq: Once | ORAL | Status: AC
  Administered 2022-03-19: 650 mg via ORAL
  Filled 2022-03-19: qty 2

## 2022-03-19 MED ORDER — LORATADINE 10 MG PO TABS
10.0000 mg | ORAL_TABLET | Freq: Once | ORAL | Status: AC
  Administered 2022-03-19: 10 mg via ORAL
  Filled 2022-03-19: qty 1

## 2022-03-19 MED ORDER — SODIUM CHLORIDE 0.9 % IV SOLN: Freq: Once | INTRAVENOUS | Status: AC

## 2022-03-19 NOTE — Progress Notes (Signed)
Patient presents today for Feraheme infusion per providers order.  Vital signs WNL.  Patient has no new complaints at this time.  Peripheral IV started and blood return noted pre and post infusion.    Stable during infusion without adverse affects.  Vital signs stable.  No complaints at this time.  Discharge from clinic ambulatory in stable condition.  Alert and oriented X 3.  Follow up with [facility] as scheduled.  

## 2022-03-19 NOTE — Patient Instructions (Signed)
[facility]  Discharge Instructions: Thank you for choosing [facility] to provide your oncology and hematology care.  If you have a lab appointment with the Cancer Center, please come in thru the Main Entrance and check in at the main information desk.  Wear comfortable clothing and clothing appropriate for easy access to any Portacath or PICC line.   We strive to give you quality time with your provider. You may need to reschedule your appointment if you arrive late (15 or more minutes).  Arriving late affects you and other patients whose appointments are after yours.  Also, if you miss three or more appointments without notifying the office, you may be dismissed from the clinic at the provider's discretion.      For prescription refill requests, have your pharmacy contact our office and allow 72 hours for refills to be completed.    Today you received the following chemotherapy and/or immunotherapy agents Feraheme      To help prevent nausea and vomiting after your treatment, we encourage you to take your nausea medication as directed.  BELOW ARE SYMPTOMS THAT SHOULD BE REPORTED IMMEDIATELY: *FEVER GREATER THAN 100.4 F (38 C) OR HIGHER *CHILLS OR SWEATING *NAUSEA AND VOMITING THAT IS NOT CONTROLLED WITH YOUR NAUSEA MEDICATION *UNUSUAL SHORTNESS OF BREATH *UNUSUAL BRUISING OR BLEEDING *URINARY PROBLEMS (pain or burning when urinating, or frequent urination) *BOWEL PROBLEMS (unusual diarrhea, constipation, pain near the anus) TENDERNESS IN MOUTH AND THROAT WITH OR WITHOUT PRESENCE OF ULCERS (sore throat, sores in mouth, or a toothache) UNUSUAL RASH, SWELLING OR PAIN  UNUSUAL VAGINAL DISCHARGE OR ITCHING   Items with * indicate a potential emergency and should be followed up as soon as possible or go to the Emergency Department if any problems should occur.  Please show the CHEMOTHERAPY ALERT CARD or IMMUNOTHERAPY ALERT CARD at check-in to the  Emergency Department and triage nurse.  Should you have questions after your visit or need to cancel or reschedule your appointment, please contact [facility] [phone]  and follow the prompts.  Office hours are 8:00 a.m. to 4:30 p.m. Monday - Friday. Please note that voicemails left after 4:00 p.m. may not be returned until the following business day.  We are closed weekends and major holidays. You have access to a nurse at all times for urgent questions. Please call the main number to the clinic [phone] and follow the prompts.  For any non-urgent questions, you may also contact your provider using MyChart. We now offer e-Visits for anyone 18 and older to request care online for non-urgent symptoms. For details visit [email or web address].   Also download the MyChart app! Go to the app store, search "MyChart", open the app, select [facility], and log in with your MyChart username and password.  Masks are optional in the cancer centers. If you would like for your care team to wear a mask while they are taking care of you, please let them know. You may have one support person who is at least 53 years old accompany you for your appointments.  

## 2022-03-19 NOTE — Patient Instructions (Signed)
[place] Discharge Instructions  You were seen today by Tarri Abernethy PA-C for your iron deficiency anemia.  Your blood and iron levels remain low.  You will receive your second dose of IV iron today.  I will recheck your blood and iron levels in 2 months along with an office visit.  Continue to follow-up with your GI doctor regarding possible bleeding in your stomach and intestines.  FOLLOW-UP APPOINTMENT: Labs in 2 months.  Office visit 1 week after labs.   - - - - - - - - - - - - - - - - - -    Thank you for choosing [place] at [facility] to provide your oncology and hematology care.  To afford each patient quality time with our provider, please arrive at least 15 minutes before your scheduled appointment time.   If you have a lab appointment with the [facility] please come in thru the Main Entrance and check in at the main information desk.  You need to re-schedule your appointment should you arrive 10 or more minutes late.  We strive to give you quality time with our providers, and arriving late affects you and other patients whose appointments are after yours.  Also, if you no show three or more times for appointments you may be dismissed from the clinic at the providers discretion.     Again, thank you for choosing [facility].  Our hope is that these requests will decrease the amount of time that you wait before being seen by our physicians.       _____________________________________________________________  Should you have questions after your visit to [facility], please contact our office at [phone] and follow the prompts.  Our office hours are 8:00 a.m. and 4:30 p.m. Monday - Friday.  Please note that voicemails left after 4:00 p.m. may not be returned until the following business day.  We are closed weekends and major holidays.  You do have access to a nurse 24-7, just call  the main number to the clinic [phone] and do not press any options, hold on the line and a nurse will answer the phone.    For prescription refill requests, have your pharmacy contact our office and allow 72 hours.    Due to Covid, you will need to wear a mask upon entering the hospital. If you do not have a mask, a mask will be given to you at the Main Entrance upon arrival. For doctor visits, patients may have 1 support person age 52 or older with them. For treatment visits, patients can not have anyone with them due to social distancing guidelines and our immunocompromised population.

## 2022-03-26 ENCOUNTER — Ambulatory Visit (INDEPENDENT_AMBULATORY_CARE_PROVIDER_SITE_OTHER): Payer: Medicare Other | Admitting: Family

## 2022-03-26 ENCOUNTER — Encounter: Payer: Self-pay | Admitting: Family

## 2022-03-26 VITALS — BP 124/74 | HR 106 | Temp 98.5°F | Ht 67.0 in | Wt 206.8 lb

## 2022-03-26 DIAGNOSIS — F321 Major depressive disorder, single episode, moderate: Secondary | ICD-10-CM | POA: Diagnosis not present

## 2022-03-26 DIAGNOSIS — I10 Essential (primary) hypertension: Secondary | ICD-10-CM | POA: Diagnosis not present

## 2022-03-26 DIAGNOSIS — R634 Abnormal weight loss: Secondary | ICD-10-CM

## 2022-03-26 DIAGNOSIS — K219 Gastro-esophageal reflux disease without esophagitis: Secondary | ICD-10-CM | POA: Diagnosis not present

## 2022-03-26 DIAGNOSIS — F411 Generalized anxiety disorder: Secondary | ICD-10-CM | POA: Diagnosis not present

## 2022-03-26 DIAGNOSIS — Z23 Encounter for immunization: Secondary | ICD-10-CM

## 2022-03-26 MED ORDER — BUPROPION HCL ER (XL) 150 MG PO TB24: 150.0000 mg | ORAL_TABLET | Freq: Every day | ORAL | 1 refills | Status: DC

## 2022-03-26 NOTE — Patient Instructions (Signed)

## 2022-03-26 NOTE — Progress Notes (Signed)
Subjective:    Patient ID: Brittany Durham, female    DOB: [birth year], 51 y.o.   MRN: [id number]  Chief Complaint  Patient presents with   Weight Check    Patient states she is losing wt and not trying    Pt presents to the office today for unintentional weight loss. Per EPIC 11/30/21 she weighed 212 lbs and today is 206 lb. She is worked up today crying and worried she has cancer. States her father died of cancer.      2022-04-01    8:52 AM 03/19/2022   11:12 AM 03/06/2022   10:23 AM  Last 3 Weights  Weight (lbs) 206 lb 12.8 oz 205 lb 11.2 oz 210 lb  Weight (kg) 93.804 kg 93.305 kg 95.255 kg    Pt denies any headache, palpitations, SOB, N&V, or edema at this time.   She had a CT abdomen on 10/25/20 that showed, "1. No acute findings are noted in the abdomen or pelvis to account for the patient's symptoms. 2. Colonic diverticulosis without evidence of acute diverticulitis at this time. 3. Fibroid uterus. 4. Large hiatal hernia. 5. Aortic atherosclerosis"  Hypertension This is a chronic problem. The current episode started more than 1 year ago. The problem has been resolved since onset. The problem is controlled. Associated symptoms include anxiety and malaise/fatigue. Pertinent negatives include no peripheral edema. Risk factors for coronary artery disease include dyslipidemia and sedentary lifestyle. The current treatment provides moderate improvement.  Gastroesophageal Reflux She complains of belching, heartburn and a hoarse voice. This is a chronic problem. The current episode started more than 1 year ago. The problem occurs occasionally. Associated symptoms include fatigue. She has tried a PPI for the symptoms. The treatment provided moderate relief.  Anxiety Presents for follow-up visit. Symptoms include depressed mood, excessive worry, irritability, nervous/anxious behavior, panic and restlessness. Symptoms occur occasionally. The severity of symptoms is moderate.     Depression        This is a chronic problem.  The current episode started more than 1 year ago.   Associated symptoms include fatigue, restlessness, decreased interest, appetite change and sad.  Associated symptoms include no helplessness and no hopelessness.  Past treatments include SSRIs - Selective serotonin reuptake inhibitors.  Past medical history includes anxiety.       Review of Systems  Constitutional:  Positive for appetite change, fatigue, irritability and malaise/fatigue.  HENT:  Positive for hoarse voice.   Gastrointestinal:  Positive for heartburn.  Psychiatric/Behavioral:  Positive for depression. The patient is nervous/anxious.   All other systems reviewed and are negative.      Objective:   Physical Exam Vitals reviewed.  Constitutional:      General: She is not in acute distress.    Appearance: She is well-developed.  HENT:     Head: Normocephalic and atraumatic.     Right Ear: Tympanic membrane normal.     Left Ear: Tympanic membrane normal.  Eyes:     Pupils: Pupils are equal, round, and reactive to light.  Neck:     Thyroid: No thyromegaly.  Cardiovascular:     Rate and Rhythm: Normal rate and regular rhythm.     Heart sounds: Normal heart sounds. No murmur heard. Pulmonary:     Effort: Pulmonary effort is normal. No respiratory distress.     Breath sounds: Normal breath sounds. No wheezing.  Abdominal:     General: Bowel sounds are normal. There is no distension.     Palpations:  Abdomen is soft.     Tenderness: There is no abdominal tenderness.  Musculoskeletal:        General: No tenderness. Normal range of motion.     Cervical back: Normal range of motion and neck supple.  Skin:    General: Skin is warm and dry.  Neurological:     Mental Status: She is alert and oriented to person, place, and time.     Cranial Nerves: No cranial nerve deficit.     Deep Tendon Reflexes: Reflexes are normal and symmetric.  Psychiatric:        Mood and  Affect: Mood is anxious. Affect is tearful.        Behavior: Behavior normal.        Thought Content: Thought content normal.        Judgment: Judgment normal.          BP 124/74   Pulse (!) 106   Temp 98.5 F (36.9 C) (Temporal)   Ht '5\' 7"'$  (1.702 m)   Wt 206 lb 12.8 oz (93.8 kg)   LMP 09/16/2017   SpO2 95%   BMI 32.39 kg/m   Assessment & Plan:  Brittany Durham comes in today with chief complaint of Weight Check (Patient states she is losing wt and not trying )   Diagnosis and orders addressed:  1. HYPERTENSION, BENIGN  2. Gastroesophageal reflux disease without esophagitis  3. Depression, major, single episode, moderate (HCC) - buPROPion (WELLBUTRIN XL) 150 MG 24 hr tablet; Take 1 tablet (150 mg total) by mouth daily.  Dispense: 90 tablet; Refill: 1  4. GAD (generalized anxiety disorder) - buPROPion (WELLBUTRIN XL) 150 MG 24 hr tablet; Take 1 tablet (150 mg total) by mouth daily.  Dispense: 90 tablet; Refill: 1  5. Weight loss, unintentional   Labs reviewed Will start Wellbutrin 150 mg I am not very concern of 6 lb weight loss in last 4 months. I think this may be coming from uncontrolled GAD.  Health Maintenance reviewed Diet and exercise encouraged  Follow up plan: 4 weeks as pap   Evelina Dun, FNP

## 2022-03-26 NOTE — Addendum Note (Signed)
Addended by: Ladean Raya on: 03/26/2022 09:38 AM   Modules accepted: Orders

## 2022-03-30 DIAGNOSIS — K921 Melena: Secondary | ICD-10-CM | POA: Diagnosis not present

## 2022-03-30 DIAGNOSIS — R197 Diarrhea, unspecified: Secondary | ICD-10-CM | POA: Diagnosis not present

## 2022-03-30 DIAGNOSIS — K5730 Diverticulosis of large intestine without perforation or abscess without bleeding: Secondary | ICD-10-CM | POA: Diagnosis not present

## 2022-03-30 DIAGNOSIS — D500 Iron deficiency anemia secondary to blood loss (chronic): Secondary | ICD-10-CM | POA: Diagnosis not present

## 2022-04-21 ENCOUNTER — Emergency Department (HOSPITAL_COMMUNITY): Payer: Medicare Other

## 2022-04-21 ENCOUNTER — Emergency Department (HOSPITAL_COMMUNITY)
Admission: EM | Admit: 2022-04-21 | Discharge: 2022-04-21 | Disposition: A | Discharge status: Home / Self Care | Payer: Medicare Other | Attending: Emergency Medicine | Admitting: Emergency Medicine

## 2022-04-21 ENCOUNTER — Other Ambulatory Visit: Payer: Self-pay

## 2022-04-21 ENCOUNTER — Encounter (HOSPITAL_COMMUNITY): Payer: Self-pay | Admitting: *Deleted

## 2022-04-21 DIAGNOSIS — Z79899 Other long term (current) drug therapy: Secondary | ICD-10-CM | POA: Diagnosis not present

## 2022-04-21 DIAGNOSIS — M5412 Radiculopathy, cervical region: Secondary | ICD-10-CM

## 2022-04-21 DIAGNOSIS — I10 Essential (primary) hypertension: Secondary | ICD-10-CM | POA: Insufficient documentation

## 2022-04-21 DIAGNOSIS — M25512 Pain in left shoulder: Secondary | ICD-10-CM | POA: Diagnosis present

## 2022-04-21 DIAGNOSIS — M19012 Primary osteoarthritis, left shoulder: Secondary | ICD-10-CM | POA: Diagnosis not present

## 2022-04-21 DIAGNOSIS — M542 Cervicalgia: Secondary | ICD-10-CM | POA: Diagnosis not present

## 2022-04-21 MED ORDER — OXYCODONE HCL 5 MG PO TABS
5.0000 mg | ORAL_TABLET | Freq: Once | ORAL | Status: AC
  Administered 2022-04-21: 5 mg via ORAL
  Filled 2022-04-21: qty 1

## 2022-04-21 MED ORDER — OXYCODONE-ACETAMINOPHEN 5-325 MG PO TABS: 1.0000 | ORAL_TABLET | Freq: Four times a day (QID) | ORAL | 0 refills | Status: DC | PRN

## 2022-04-21 MED ORDER — METHOCARBAMOL 500 MG PO TABS: 500.0000 mg | ORAL_TABLET | Freq: Two times a day (BID) | ORAL | 0 refills | Status: DC

## 2022-04-21 MED ORDER — METHOCARBAMOL 500 MG PO TABS
1000.0000 mg | ORAL_TABLET | Freq: Once | ORAL | Status: AC
  Administered 2022-04-21: 1000 mg via ORAL
  Filled 2022-04-21: qty 2

## 2022-04-21 NOTE — Discharge Instructions (Addendum)
You came to the emergency department today with left shoulder pain and difficulty grasping with both of your hands.  Your presentation was inconsistent with a stroke however it is concerning for some type of nerve impingement.  As we discussed, we are unable to do an MRI tonight.  You did not want to be transferred to [facility] or [facility] and preferred to follow-up with [place] may present to the [facility] emergency department and tell them about your visit today.  With any worsening symptoms, please present to the nearest emergency department.  I sent 2 medications to your pharmacy.  The first is Percocet and the second is Robaxin.  The Percocet is a controlled substance and can be very addictive so keep it out of the reach of others.  Robaxin is something that may make you drowsy so do not take it during the day if you have things that you need to do.  It was a pleasure to meet you and we hope you feel better.  Again, please return to the nearest emergency department with any further concerns

## 2022-04-21 NOTE — ED Provider Notes (Signed)
[facility] EMERGENCY DEPARTMENT Provider Note   CSN: [id number] Arrival date & time: 04/21/22  1939     History  Chief Complaint  Patient presents with   Shoulder Pain    Brittany Durham is a 51 y.o. female with a past medical history of degenerative disc disease and cervical radiculopathy status post cervical spine surgery presenting today with left shoulder pain and decreased strength in her bilateral hands.  She reports that last night she was having some pain to her left shoulder and this morning when she woke up she was having difficulty grasping things with her bilateral hands.  She says that they both feel very weak.  No falls.  No blurred vision, facial droop or numbness.   Shoulder Pain      Home Medications Prior to Admission medications   Medication Sig Start Date End Date Taking? Authorizing Provider  amLODipine (NORVASC) 10 MG tablet Take 1 tablet (10 mg total) by mouth daily. (NEEDS TO BE SEEN BEFORE NEXT REFILL) 03/06/22   Evelina Dun A, FNP  buPROPion (WELLBUTRIN XL) 150 MG 24 hr tablet Take 1 tablet (150 mg total) by mouth daily. 03/26/22   Hawks, Alyse Low A, FNP  busPIRone (BUSPAR) 5 MG tablet TAKE ONE TABLET BY MOUTH UP TO THREE TIMES DAILY. 03/06/22   Evelina Dun A, FNP  escitalopram (LEXAPRO) 20 MG tablet Take 1 tablet (20 mg total) by mouth daily. 03/06/22   Evelina Dun A, FNP  ferrous sulfate 325 (65 FE) MG tablet Take 1 tablet by mouth daily. 07/18/20   [provider]  lisinopril (ZESTRIL) 20 MG tablet Take 1 tablet (20 mg total) by mouth daily. (NEEDS TO BE SEEN BEFORE NEXT REFILL) 03/06/22   Sharion Balloon, FNP  pantoprazole (PROTONIX) 40 MG tablet TAKE 1 TABLET ONCE DAILY BEFORE BREAKFAST 03/06/22   Evelina Dun A, FNP      Allergies    Acetaminophen and Venofer [iron sucrose]    Review of Systems   Review of Systems  Physical Exam Updated Vital Signs BP 108/85   Pulse (!) 117   Temp (!) 97.5 F (36.4 C) (Oral)   Resp 14    Ht '5\' 7"'$  (1.702 m)   Wt 94.9 kg   LMP 09/16/2017   SpO2 98%   BMI 32.77 kg/m  Physical Exam Vitals and nursing note reviewed.  Constitutional:      Appearance: Normal appearance.  HENT:     Head: Normocephalic and atraumatic.  Eyes:     General: No scleral icterus.    Conjunctiva/sclera: Conjunctivae normal.  Neck:     Comments: No midline tenderness Pulmonary:     Effort: Pulmonary effort is normal. No respiratory distress.  Musculoskeletal:     Cervical back: Normal range of motion.     Comments: Full range of motion of right upper extremity.  2 out of 5 finger grip bilaterally.  Left shoulder flexion and abduction limited secondary to pain.  Strong radial pulse bilaterally.  Normal cap refill in all digits of the bilateral upper extremities  Skin:    General: Skin is warm and dry.     Findings: No rash.  Neurological:     Mental Status: She is alert.     Comments: PERRLA, EOMs intact.  Normal strength in bilateral lower extremities with normal pulses.  No facial droop.  Patient has aphasia and a speech problem at baseline.  Daughter reports that this is no more pronounced than usual.  Psychiatric:  Mood and Affect: Mood normal.     ED Results / Procedures / Treatments   Labs (all labs ordered are listed, but only abnormal results are displayed) Labs Reviewed - No data to display  EKG EKG Interpretation  Date/Time:  Saturday April 21 2022 19:55:41 EST Ventricular Rate:  117 PR Interval:  142 QRS Duration: 72 QT Interval:  324 QTC Calculation: 451 R Axis:   56 Text Interpretation: Sinus tachycardia Otherwise normal ECG When compared with ECG of 15-Jan-2020 08:52, No significant change since last tracing Confirmed by Aletta Edouard [phone]) on 04/21/2022 8:00:48 PM  Radiology DG Shoulder Left  Result Date: 04/21/2022 CLINICAL DATA:  Left shoulder pain for 2 days. Limited range of motion. EXAM: LEFT SHOULDER - 2+ VIEW COMPARISON:  T1 2020. FINDINGS: There  is no evidence of fracture or dislocation. Mild degenerative changes at the acromioclavicular and glenohumeral joints. Soft tissues are unremarkable. IMPRESSION: 1. No acute fracture or dislocation. 2. Mild degenerative changes at the acromioclavicular and glenohumeral joints. Electronically Signed   By: Brett Fairy M.D.   On: 04/21/2022 20:21    Procedures Procedures   Medications Ordered in ED Medications  oxyCODONE (Oxy IR/ROXICODONE) immediate release tablet 5 mg (5 mg Oral Given 04/21/22 2116)    ED Course/ Medical Decision Making/ A&P                           Medical Decision Making Amount and/or Complexity of Data Reviewed Radiology: ordered.  Risk Prescription drug management.   51 year old female presenting with left shoulder pain and decreased hand strength.  Differential includes but is not limited to CVA, radiculopathy, hardware failure, demyelinating condition such as MS/myasthenia gravis/Guillain-Barr, muscle, ligamental or nerve injury.    This is not an exhaustive differential.    Past Medical History / Co-morbidities / Social History: Hypertension, cervical spinal decompression, slurred speech   Additional history: Per chart review patient had an anterior cervical decompression fusion at C4/C5 and C5/C6.  This was done by Dr. Ronnald Ramp with neurosurgery in 2020.   Physical Exam: Pertinent physical exam findings include Normal strength in bilateral lower extremities 2 out of 5 finger grip bilaterally Normal sensation and normal cap refill in upper extremities.  Strong distal pulses in bilateral upper and lower Aphasia and stuttered speech is patient's baseline.  Daughter reports its no different. Active range of motion of the left upper extremity is limited secondary to pain however passive range of motion remains intact    Imaging Studies: X-ray ordered by nursing staff.  Viewed and interpreted by me.  I agree that there are no signs of acute fracture or  dislocation.  Due to patient's bilateral symptoms and decreased strength of finger grip CT of her cervical spine was ordered.  This revealed no function in her previous hardware.  No other explanation for her bilateral symptoms.    Medications: I ordered medication including oxycodone and Robaxin. Reevaluation of the patient after these medicines showed that the patient improved.     MDM/Disposition: This is a 51 year old female with a past medical history of cervical spine surgery presenting today with the complaint of left shoulder pain and bilateral hand weakness.  Triage nurse ordered an x-ray of her shoulder which was unremarkable outside of arthritis.  CT scan was ordered by me to evaluate patient's hardware from 2020 surgery.  There were no concerning findings.  Ultimately patient needs an MRI.  We discussed transfer to [facility] or  [facility] for this imaging and she says that she is unable to do that tonight.  Her daughter is at bedside and they say they would prefer to go to [facility] where most of her care has been performed.  I told them I would be unable to schedule an outpatient MRI with [facility] and they said that they would still prefer to go there and we will go on Monday.  I do not believe I need to sign the patient out AMA.  No signs of acute CVA neurologic condition requiring emergent intervention at this moment.  Strict return precautions were given for the nearest emergency department.  They voiced understanding and patient was discharged with Percocet and Robaxin proper instructions for both.     I discussed this case with my attending physician Dr. Melina Copa who cosigned this note including patient's presenting symptoms, physical exam, and planned diagnostics and interventions. Attending physician stated agreement with plan or made changes to plan which were implemented.     Final Clinical Impression(s) / ED Diagnoses Final diagnoses:  Cervical radiculopathy    Rx / DC  Orders ED Discharge Orders          Ordered    oxyCODONE-acetaminophen (PERCOCET/ROXICET) 5-325 MG tablet  Every 6 hours PRN        04/21/22 2234    methocarbamol (ROBAXIN) 500 MG tablet  2 times daily        04/21/22 2234           Results and diagnoses were explained to the patient. Return precautions discussed in full. Patient had no additional questions and expressed complete understanding.   This chart was dictated using voice recognition software.  Despite best efforts to proofread,  errors can occur which can change the documentation meaning.    Darliss Ridgel 04/21/22 2253    Hayden Rasmussen, MD 04/22/22 1030

## 2022-04-21 NOTE — ED Triage Notes (Signed)
Pt states she woke up with left shoulder pain 2 days ago.  Limited rom due to pain. Not able to grip with either hand.  Denies any injury.

## 2022-04-26 ENCOUNTER — Ambulatory Visit (INDEPENDENT_AMBULATORY_CARE_PROVIDER_SITE_OTHER): Payer: Medicare Other | Admitting: Family

## 2022-04-26 ENCOUNTER — Encounter: Payer: Self-pay | Admitting: Family

## 2022-04-26 ENCOUNTER — Other Ambulatory Visit (HOSPITAL_COMMUNITY)
Admission: RE | Admit: 2022-04-26 | Discharge: 2022-04-26 | Disposition: A | Discharge status: Home / Self Care | Payer: Medicare Other | Source: Ambulatory Visit | Attending: Family | Admitting: Family

## 2022-04-26 VITALS — BP 122/78 | HR 101 | Temp 97.8°F | Ht 67.0 in | Wt 207.0 lb

## 2022-04-26 DIAGNOSIS — F321 Major depressive disorder, single episode, moderate: Secondary | ICD-10-CM

## 2022-04-26 DIAGNOSIS — E559 Vitamin D deficiency, unspecified: Secondary | ICD-10-CM | POA: Diagnosis not present

## 2022-04-26 DIAGNOSIS — K219 Gastro-esophageal reflux disease without esophagitis: Secondary | ICD-10-CM

## 2022-04-26 DIAGNOSIS — M150 Primary generalized (osteo)arthritis: Secondary | ICD-10-CM

## 2022-04-26 DIAGNOSIS — Z01419 Encounter for gynecological examination (general) (routine) without abnormal findings: Secondary | ICD-10-CM | POA: Insufficient documentation

## 2022-04-26 DIAGNOSIS — I10 Essential (primary) hypertension: Secondary | ICD-10-CM

## 2022-04-26 DIAGNOSIS — G4700 Insomnia, unspecified: Secondary | ICD-10-CM

## 2022-04-26 DIAGNOSIS — F411 Generalized anxiety disorder: Secondary | ICD-10-CM

## 2022-04-26 DIAGNOSIS — M159 Polyosteoarthritis, unspecified: Secondary | ICD-10-CM | POA: Diagnosis not present

## 2022-04-26 DIAGNOSIS — Z1151 Encounter for screening for human papillomavirus (HPV): Secondary | ICD-10-CM | POA: Insufficient documentation

## 2022-04-26 NOTE — Patient Instructions (Signed)

## 2022-04-26 NOTE — Progress Notes (Signed)
Subjective:    Patient ID: Brittany Durham, female    DOB: 1970-07-18, 51 y.o.   MRN: [id number]  Chief Complaint  Patient presents with   Gynecologic Exam   PT presents to the office today for CPE with pap and chronic follow up. Pt is followed by Neurologists for Speech difficulties and seizures.    She is followed by GI every 6 months for GERD and diverticulitis and right flank pain. She had ar Cholecystectomy on 02/23/21. Since then her flank pain has improved.    She is followed by Hematologists for iron deficiency and getting iron infusions.  Gynecologic Exam The patient's pertinent negatives include no genital itching, genital odor or vaginal discharge. This is a chronic problem. The current episode started more than 1 year ago. The patient is experiencing no pain.  Hypertension This is a chronic problem. The current episode started more than 1 year ago. The problem has been resolved since onset. The problem is controlled. Associated symptoms include anxiety. Pertinent negatives include no malaise/fatigue, peripheral edema or shortness of breath. Risk factors for coronary artery disease include dyslipidemia, obesity and sedentary lifestyle. The current treatment provides moderate improvement. There is no history of CVA or heart failure.  Gastroesophageal Reflux She complains of belching and heartburn. This is a chronic problem. The current episode started more than 1 year ago. The problem occurs occasionally. She has tried a PPI for the symptoms. The treatment provided moderate relief.  Arthritis Presents for follow-up visit. She reports no pain or stiffness. The symptoms have been resolved. Her pain is at a severity of 0/10.  Anemia Presents for follow-up visit. There has been no malaise/fatigue. There is no history of heart failure.  Depression        This is a chronic problem.  The current episode started more than 1 year ago.   The onset quality is gradual.   The problem occurs  intermittently.  Associated symptoms include insomnia.  Associated symptoms include no helplessness, no hopelessness and not sad.  Past treatments include SSRIs - Selective serotonin reuptake inhibitors.  Compliance with treatment is good.  Past medical history includes anxiety.   Anxiety Presents for follow-up visit. Symptoms include excessive worry, insomnia and nervous/anxious behavior. Patient reports no shortness of breath. Symptoms occur occasionally. The severity of symptoms is mild.   Her past medical history is significant for anemia.  Insomnia Primary symptoms: difficulty falling asleep, no malaise/fatigue.   The current episode started more than one year. The problem occurs intermittently. PMH includes: depression.       Review of Systems  Constitutional:  Negative for malaise/fatigue.  Respiratory:  Negative for shortness of breath.   Gastrointestinal:  Positive for heartburn.  Genitourinary:  Negative for vaginal discharge.  Musculoskeletal:  Positive for arthritis. Negative for stiffness.  Psychiatric/Behavioral:  Positive for depression. The patient is nervous/anxious and has insomnia.   All other systems reviewed and are negative.  Family History  Problem Relation Age of Onset   Hypertension Mother    Lung cancer Father    Hypertension Brother    Colon cancer Neg Hx    Gastric cancer Neg Hx    Esophageal cancer Neg Hx    Social History   Socioeconomic History   Marital status: Single    Spouse name: Not on file   Number of children: 1   Years of education: 12   Highest education level: Not on file  Occupational History   Occupation: Umemployed  Comment: Disability  Tobacco Use   Smoking status: Never   Smokeless tobacco: Never  Vaping Use   Vaping Use: Never used  Substance and Sexual Activity   Alcohol use: No   Drug use: No   Sexual activity: Not Currently    Birth control/protection: Surgical  Other Topics Concern   Not on file  Social History  Narrative   Lives at home alone.   Disabled - speech difficulties, cannot drive   Right-handed.   Occasional caffeine use.   Her daughter lives about 30-45 minutes away   She has lots of family nearby - uncle, mother, sister, brother   Social Determinants of Health   Financial Resource Strain: [place]  (12/14/2021)   Overall Financial Resource Strain (CARDIA)    Difficulty of Paying Living Expenses: Not hard at all  Food Insecurity: No Food Insecurity (12/14/2021)   Hunger Vital Sign    Worried About Running Out of Food in the Last Year: Never true    [place] in the Last Year: Never true  Transportation Needs: No Transportation Needs (12/14/2021)   PRAPARE - Hydrologist (Medical): No    Lack of Transportation (Non-Medical): No  Physical Activity: Insufficiently Active (12/14/2021)   Exercise Vital Sign    Days of Exercise per Week: 2 days    Minutes of Exercise per Session: 20 min  Stress: No Stress Concern Present (12/14/2021)   [place]    Feeling of Stress : Not at all  Social Connections: Moderately Isolated (12/14/2021)   Social Connection and Isolation Panel [NHANES]    Frequency of Communication with Friends and Family: More than three times a week    Frequency of Social Gatherings with Friends and Family: Three times a week    Attends Religious Services: More than 4 times per year    Active Member of Clubs or Organizations: No    Attends Archivist Meetings: Never    Marital Status: Never married       Objective:   Physical Exam Vitals reviewed.  Constitutional:      General: She is not in acute distress.    Appearance: She is well-developed.  HENT:     Head: Normocephalic and atraumatic.     Right Ear: Tympanic membrane normal.     Left Ear: Tympanic membrane normal.  Eyes:     Pupils: Pupils are equal, round, and reactive to light.  Neck:     Thyroid:  No thyromegaly.  Cardiovascular:     Rate and Rhythm: Normal rate and regular rhythm.     Heart sounds: Normal heart sounds. No murmur heard. Pulmonary:     Effort: Pulmonary effort is normal. No respiratory distress.     Breath sounds: Normal breath sounds. No wheezing.  Chest:  Breasts:    Right: No swelling, bleeding, inverted nipple, mass, nipple discharge, skin change or tenderness.     Left: No swelling, bleeding, inverted nipple, mass, nipple discharge, skin change or tenderness.  Abdominal:     General: Bowel sounds are normal. There is no distension.     Palpations: Abdomen is soft.     Tenderness: There is no abdominal tenderness.  Genitourinary:    Comments: Bimanual exam- no adnexal masses or tenderness, ovaries nonpalpable   Cervix parous and pink- No discharge  Musculoskeletal:        General: No tenderness. Normal range of motion.  Cervical back: Normal range of motion and neck supple.  Skin:    General: Skin is warm and dry.  Neurological:     Mental Status: She is alert and oriented to person, place, and time.     Cranial Nerves: No cranial nerve deficit.     Deep Tendon Reflexes: Reflexes are normal and symmetric.  Psychiatric:        Behavior: Behavior normal.        Thought Content: Thought content normal.        Judgment: Judgment normal.       BP 122/78   Pulse (!) 101   Temp 97.8 F (36.6 C) (Temporal)   Ht '5\' 7"'$  (1.702 m)   Wt 207 lb (93.9 kg)   LMP 09/16/2017   SpO2 96%   BMI 32.42 kg/m      Assessment & Plan:  [facility] comes in today with chief complaint of Gynecologic Exam   Diagnosis and orders addressed:  1. Primary osteoarthritis involving multiple joints  2. Encounter for gynecological examination without abnormal finding  3. Vitamin D deficiency  4. HYPERTENSION, BENIGN  5. Insomnia, unspecified type  6. Gastroesophageal reflux disease without esophagitis  7. Depression, major, single episode, moderate  ([place])  8. GAD (generalized anxiety disorder)   9. Gynecologic exam normal - Cytology - PAP([place])    Health Maintenance reviewed Diet and exercise encouraged  Follow up plan: 6 months    Evelina Dun, FNP

## 2022-05-01 LAB — CYTOLOGY - PAP
Adequacy: ABSENT
Chlamydia: NEGATIVE
Comment: NEGATIVE
Comment: NEGATIVE
Comment: NEGATIVE
Comment: NORMAL
Diagnosis: NEGATIVE
Diagnosis: REACTIVE
High risk HPV: NEGATIVE
Neisseria Gonorrhea: NEGATIVE
Trichomonas: NEGATIVE

## 2022-05-17 ENCOUNTER — Inpatient Hospital Stay: Payer: 59 | Attending: Hematology

## 2022-05-17 DIAGNOSIS — K625 Hemorrhage of anus and rectum: Secondary | ICD-10-CM | POA: Diagnosis not present

## 2022-05-17 DIAGNOSIS — D509 Iron deficiency anemia, unspecified: Secondary | ICD-10-CM | POA: Insufficient documentation

## 2022-05-17 DIAGNOSIS — Z79899 Other long term (current) drug therapy: Secondary | ICD-10-CM | POA: Diagnosis not present

## 2022-05-17 DIAGNOSIS — D500 Iron deficiency anemia secondary to blood loss (chronic): Secondary | ICD-10-CM

## 2022-05-17 LAB — CBC WITH DIFFERENTIAL/PLATELET
Abs Immature Granulocytes: 0.01 10*3/uL (ref 0.00–0.07)
Basophils Absolute: 0 10*3/uL (ref 0.0–0.1)
Basophils Relative: 0 %
Eosinophils Absolute: 0.2 10*3/uL (ref 0.0–0.5)
Eosinophils Relative: 3 %
HCT: 34.9 % — ABNORMAL LOW (ref 36.0–46.0)
Hemoglobin: 11.2 g/dL — ABNORMAL LOW (ref 12.0–15.0)
Immature Granulocytes: 0 %
Lymphocytes Relative: 29 %
Lymphs Abs: 1.4 10*3/uL (ref 0.7–4.0)
MCH: 28.1 pg (ref 26.0–34.0)
MCHC: 32.1 g/dL (ref 30.0–36.0)
MCV: 87.5 fL (ref 80.0–100.0)
Monocytes Absolute: 0.4 10*3/uL (ref 0.1–1.0)
Monocytes Relative: 8 %
Neutro Abs: 2.9 10*3/uL (ref 1.7–7.7)
Neutrophils Relative %: 60 %
Platelets: 254 10*3/uL (ref 150–400)
RBC: 3.99 MIL/uL (ref 3.87–5.11)
RDW: 14 % (ref 11.5–15.5)
WBC: 4.9 10*3/uL (ref 4.0–10.5)
nRBC: 0 % (ref 0.0–0.2)

## 2022-05-17 LAB — IRON AND TIBC
Iron: 46 ug/dL (ref 28–170)
Saturation Ratios: 12 % (ref 10.4–31.8)
TIBC: 391 ug/dL (ref 250–450)
UIBC: 345 ug/dL

## 2022-05-17 LAB — FERRITIN: Ferritin: 17 ng/mL (ref 11–307)

## 2022-05-18 ENCOUNTER — Inpatient Hospital Stay: Payer: 59

## 2022-05-25 ENCOUNTER — Inpatient Hospital Stay: Payer: 59 | Admitting: Physician Assistant

## 2022-05-26 ENCOUNTER — Encounter: Payer: Self-pay | Admitting: Hematology

## 2022-05-28 NOTE — Progress Notes (Unsigned)
Brittany Durham, [place] [address]   CLINIC:  Medical Oncology/Hematology  PCP:  Sharion Balloon, [place] [address] [phone]   REASON FOR VISIT:  Follow-up for iron deficiency anemia   CURRENT THERAPY: Intermittent IV iron  INTERVAL HISTORY:   [name] 52 y.o. female returns for routine follow-up of her iron deficiency anemia.  She was last seen by Tarri Abernethy PA-C on 03/19/2022.   At today's visit, she reports feeling fairly well.  Since her last visit, she had EGD and colonoscopy at [facility] on 03/30/2022, which showed multiple scattered pancolonic diverticula of moderate severity otherwise normal colonoscopy; EGD was normal.   She had prior hypersensitivity reaction to IV Venofer, recently received Feraheme on 03/12/2022 and 03/19/2022 with steroid premedications, and tolerated this well.  She still has some residual fatigue.   She reports occasional chest pain (none today).  No pica, dyspnea, lightheadedness, syncope, or palpitations.  She continues to have intermittent rectal bleeding.  She denies any recent melena or black/tarry bowel movements.  She denies any hematemesis or epistaxis.  She has not had any further vaginal bleeding since her isolated episode in late 2022.  She has 50% energy and 50% appetite. She endorses that she is maintaining a stable weight.   ASSESSMENT & PLAN:  1.  Iron deficiency anemia with suspected occult GI bleed - ED on 07/18/2020 with Hgb 5.7, fatigue, lightheadedness.  Denied overt GI bleeding and was Hemoccult-negative, but does have a history of peptic ulcer disease.  She was transfused 2 units PRBCs on 07/18/2020, and followed up with gastroenterology.  - Colonoscopy (08/24/2020) showed diverticulosis, but was otherwise normal. - EGD (09/20/2020): Large hiatal hernia, Cameron lesions and antral erosions (suspicious for causing intermittent bleeding in the setting  of ongoing NSAID use per GI note) - EGD and colonoscopy at [facility] on 03/30/2022, which showed multiple scattered pancolonic diverticula of moderate severity otherwise normal colonoscopy; EGD was normal  - She is on Protonix for suspected recurrent peptic ulcer disease - Hematology work-up (07/29/2020) showed Hgb 8.5 (microcytic with MCV 78.2).  Ferritin was 7, with 32% iron saturation.  Reticulocytes 3.4 (reticulocyte index 1.61, consistent with hypoproliferation in the setting of iron deficiency).   - Most recent IV iron with Feraheme x2 on 03/12/2022 and 03/19/2022 - Per outside medical records, patient had mild postinfusion reaction with rash on bilateral arms after Venofer infusion on 09/29/2021. - Intermittent rectal bleeding.  No recent signs of melena. - Follows with [facility] Shanon Brow Leimberger PA-C)  - Symptomatic with fatigue, improved after IV iron - Most recent labs (05/17/2022): Hgb 11.2/MCV 87.5, ferritin 17, iron saturation 12%.  Labs from October 2023 showed CMP unremarkable, LDH normal.  Normal B12, MMA, folate, SPEP, haptoglobin. - PLAN: Recommend additional IV Feraheme x2 - PREMEDICATE WITH STEROIDS due to possible reaction after most recent Venofer. - Continue GI and GYN follow-up.  Patient advised to contact GI regarding her rectal bleeding. - RTC in 3 months for repeat CBC and iron panel with same-day office visit   PLAN SUMMARY: >> Feraheme x 2 >> Same-day labs (CBC, ferritin, iron/TIBC) + OFFICE visit in 3 months     REVIEW OF SYSTEMS:   Review of Systems  Constitutional:  Positive for fatigue. Negative for appetite change, chills, diaphoresis, fever and unexpected weight change.  HENT:   Negative for lump/mass and nosebleeds.   Eyes:  Negative for  eye problems.  Respiratory:  Negative for cough, hemoptysis and shortness of breath.   Cardiovascular:  Positive for chest pain (none today). Negative for leg swelling and palpitations.  Gastrointestinal:   Positive for diarrhea. Negative for abdominal pain, blood in stool, constipation, nausea and vomiting.  Genitourinary:  Negative for hematuria.   Musculoskeletal:  Positive for arthralgias.  Skin: Negative.   Neurological:  Positive for numbness. Negative for dizziness, headaches and light-headedness.  Hematological:  Does not bruise/bleed easily.  Psychiatric/Behavioral:  Positive for sleep disturbance.      PHYSICAL EXAM:  ECOG PERFORMANCE STATUS: 1 - Symptomatic but completely ambulatory  There were no vitals filed for this visit. There were no vitals filed for this visit. Physical Exam Constitutional:      Appearance: Normal appearance. She is obese.  HENT:     Head: Normocephalic and atraumatic.     Mouth/Throat:     Mouth: Mucous membranes are moist.  Eyes:     Extraocular Movements: Extraocular movements intact.     Pupils: Pupils are equal, round, and reactive to light.  Cardiovascular:     Rate and Rhythm: Regular rhythm.     Pulses: Normal pulses.     Heart sounds: Normal heart sounds.  Pulmonary:     Effort: Pulmonary effort is normal.     Breath sounds: Normal breath sounds.  Abdominal:     General: Bowel sounds are normal.     Palpations: Abdomen is soft.     Tenderness: There is no abdominal tenderness.  Musculoskeletal:        General: No swelling.     Right lower leg: No edema.     Left lower leg: No edema.  Lymphadenopathy:     Cervical: No cervical adenopathy.  Skin:    General: Skin is warm and dry.  Neurological:     General: No focal deficit present.     Mental Status: She is alert and oriented to person, place, and time.  Psychiatric:        Mood and Affect: Mood normal.        Behavior: Behavior normal.     PAST MEDICAL/SURGICAL HISTORY:  Past Medical History:  Diagnosis Date   Anemia    Asthma    childhood   Carotidynia    COPD (chronic obstructive pulmonary disease) ([place])    DDD (degenerative disc disease), cervical    GERD  (gastroesophageal reflux disease)    Hypertension    Knee pain    left meniscus tear s/p repair 11/2014   Seizures ([place])    last seizure was over 10 years ago, unknown etiology   Stuttering    patient reports this started after her knee surgery in 11/2014.    Thyroid nodule    Past Surgical History:  Procedure Laterality Date   ANTERIOR CERVICAL DECOMP/DISCECTOMY FUSION N/A 03/20/2019   Procedure: Anterior Cervical Decompression Fusion - Cervical Four-Cervical Five - Cervical Five-Cervical Six;  Surgeon: Eustace Moore, MD;  Location: [facility];  Service: Neurosurgery;  Laterality: N/A;  Anterior Cervical Decompression Fusion - Cervical Four-Cervical Five - Cervical Five-Cervical Six   BIOPSY  09/20/2020   Procedure: BIOPSY;  Surgeon: Daneil Dolin, MD;  [facility];  Service: Endoscopy;;   COLONOSCOPY N/A 08/23/2016   Procedure: COLONOSCOPY;  Surgeon: Daneil Dolin, MD;  [facility];  Service: Endoscopy;  Laterality: N/A;  1:00pm   COLONOSCOPY N/A 08/24/2020   Diverticulosis in the entire examined colon. Redundant colon.  Exam otherwise normal   ESOPHAGOGASTRODUODENOSCOPY N/A 05/03/2016   Procedure: ESOPHAGOGASTRODUODENOSCOPY (EGD);  Surgeon: Daneil Dolin, MD;  [facility];  Service: Endoscopy;  Laterality: N/A;   ESOPHAGOGASTRODUODENOSCOPY N/A 08/23/2016   Procedure: ESOPHAGOGASTRODUODENOSCOPY (EGD);  Surgeon: Daneil Dolin, MD;  [facility];  Service: Endoscopy;  Laterality: N/A;   ESOPHAGOGASTRODUODENOSCOPY (EGD) WITH PROPOFOL N/A 09/20/2020   normal esophagus. Large hiatal hernia. Multiple gastric erosions (Cameron lesions). Normal duodenal bulb and second portion of the duodenum. Suspicion Lysbeth Galas lesions causing intermittent bleeding   KNEE ARTHROSCOPY Right    KNEE ARTHROSCOPY WITH MEDIAL MENISECTOMY Left 12/09/2014   Procedure: KNEE ARTHROSCOPY WITH PARTIAL MEDIAL AND LATERAL MENISECTOMY;  Surgeon: Sanjuana Kava, MD;  Location: [facility];  Service: Orthopedics;  Laterality: Left;   TUBAL LIGATION     WISDOM TOOTH EXTRACTION      SOCIAL HISTORY:  Social History   Socioeconomic History   Marital status: Single    Spouse name: Not on file   Number of children: 1   Years of education: 12   Highest education level: Not on file  Occupational History   Occupation: Umemployed    Comment: Disability  Tobacco Use   Smoking status: Never   Smokeless tobacco: Never  Vaping Use   Vaping Use: Never used  Substance and Sexual Activity   Alcohol use: No   Drug use: No   Sexual activity: Not Currently    Birth control/protection: Surgical  Other Topics Concern   Not on file  Social History Narrative   Lives at home alone.   Disabled - speech difficulties, cannot drive   Right-handed.   Occasional caffeine use.   Her daughter lives about 30-45 minutes away   She has lots of family nearby - uncle, mother, sister, brother   Social Determinants of Health   Financial Resource Strain: [place]  (12/14/2021)   Overall Financial Resource Strain (CARDIA)    Difficulty of Paying Living Expenses: Not hard at all  Food Insecurity: No Food Insecurity (12/14/2021)   Hunger Vital Sign    Worried About Running Out of Food in the Last Year: Never true    [place] in the Last Year: Never true  Transportation Needs: No Transportation Needs (12/14/2021)   PRAPARE - Hydrologist (Medical): No    Lack of Transportation (Non-Medical): No  Physical Activity: Insufficiently Active (12/14/2021)   Exercise Vital Sign    Days of Exercise per Week: 2 days    Minutes of Exercise per Session: 20 min  Stress: No Stress Concern Present (12/14/2021)   [place]    Feeling of Stress : Not at all  Social Connections: Moderately Isolated (12/14/2021)   Social Connection and Isolation Panel [NHANES]    Frequency of Communication with Friends and Family:  More than three times a week    Frequency of Social Gatherings with Friends and Family: Three times a week    Attends Religious Services: More than 4 times per year    Active Member of Clubs or Organizations: No    Attends Archivist Meetings: Never    Marital Status: Never married  Intimate Partner Violence: Not At Risk (12/14/2021)   Humiliation, Afraid, Rape, and Kick questionnaire    Fear of Current or Ex-Partner: No    Emotionally Abused: No    Physically Abused: No    Sexually Abused: No  FAMILY HISTORY:  Family History  Problem Relation Age of Onset   Hypertension Mother    Lung cancer Father    Hypertension Brother    Colon cancer Neg Hx    Gastric cancer Neg Hx    Esophageal cancer Neg Hx     CURRENT MEDICATIONS:  Outpatient Encounter Medications as of 05/29/2022  Medication Sig   amLODipine (NORVASC) 10 MG tablet Take 1 tablet (10 mg total) by mouth daily. (NEEDS TO BE SEEN BEFORE NEXT REFILL)   buPROPion (WELLBUTRIN XL) 150 MG 24 hr tablet Take 1 tablet (150 mg total) by mouth daily.   busPIRone (BUSPAR) 5 MG tablet TAKE ONE TABLET BY MOUTH UP TO THREE TIMES DAILY.   escitalopram (LEXAPRO) 20 MG tablet Take 1 tablet (20 mg total) by mouth daily.   ferrous sulfate 325 (65 FE) MG tablet Take 1 tablet by mouth daily.   lisinopril (ZESTRIL) 20 MG tablet Take 1 tablet (20 mg total) by mouth daily. (NEEDS TO BE SEEN BEFORE NEXT REFILL)   methocarbamol (ROBAXIN) 500 MG tablet Take 1 tablet (500 mg total) by mouth 2 (two) times daily.   oxyCODONE-acetaminophen (PERCOCET/ROXICET) 5-325 MG tablet Take 1 tablet by mouth every 6 (six) hours as needed for severe pain.   pantoprazole (PROTONIX) 40 MG tablet TAKE 1 TABLET ONCE DAILY BEFORE BREAKFAST   No facility-administered encounter medications on file as of 05/29/2022.    ALLERGIES:  Allergies  Allergen Reactions   Acetaminophen Other (See Comments)    MD requests not to be given   Venofer [Iron Sucrose] Rash     LABORATORY DATA:  I have reviewed the labs as listed.  CBC    Component Value Date/Time   WBC 4.9 05/17/2022 1032   RBC 3.99 05/17/2022 1032   HGB 11.2 (L) 05/17/2022 1032   HGB 12.6 10/24/2020 1318   HCT 34.9 (L) 05/17/2022 1032   HCT 39.0 10/24/2020 1318   PLT 254 05/17/2022 1032   PLT 277 10/24/2020 1318   MCV 87.5 05/17/2022 1032   MCV 88 10/24/2020 1318   MCH 28.1 05/17/2022 1032   MCHC 32.1 05/17/2022 1032   RDW 14.0 05/17/2022 1032   RDW 14.8 10/24/2020 1318   LYMPHSABS 1.4 05/17/2022 1032   LYMPHSABS 2.3 10/24/2020 1318   MONOABS 0.4 05/17/2022 1032   EOSABS 0.2 05/17/2022 1032   EOSABS 0.2 10/24/2020 1318   BASOSABS 0.0 05/17/2022 1032   BASOSABS 0.0 10/24/2020 1318      Latest Ref Rng & Units 03/02/2022   11:14 AM 08/10/2021   12:03 PM 12/16/2020   11:35 AM  CMP  Glucose 70 - 99 mg/dL 95  93  144   BUN 6 - 20 mg/dL '11  14  14   '$ Creatinine 0.44 - 1.00 mg/dL 0.74  0.91  0.69   Sodium 135 - 145 mmol/L 140  144  138   Potassium 3.5 - 5.1 mmol/L 3.7  4.2  3.3   Chloride 98 - 111 mmol/L 104  103  102   CO2 22 - 32 mmol/L '27  21  28   '$ Calcium 8.9 - 10.3 mg/dL 9.8  9.9  9.1   Total Protein 6.5 - 8.1 g/dL 7.5  7.4  7.5   Total Bilirubin 0.3 - 1.2 mg/dL 0.4  0.4  0.5   Alkaline Phos 38 - 126 U/L 69  108  82   AST 15 - 41 U/L '14  15  14   '$ ALT 0 - 44  U/L '17  16  16     '$ DIAGNOSTIC IMAGING:  I have independently reviewed the relevant imaging and discussed with the patient.   WRAP UP:  All questions were answered. The patient knows to call the clinic with any problems, questions or concerns.  Medical decision making: Low  Time spent on visit: I spent 15 minutes counseling the patient face to face. The total time spent in the appointment was 22 minutes and more than 50% was on counseling.  Harriett Rush, PA-C  05/29/22 3:01 PM

## 2022-05-29 ENCOUNTER — Encounter: Payer: Self-pay | Admitting: Physician Assistant

## 2022-05-29 ENCOUNTER — Inpatient Hospital Stay (HOSPITAL_BASED_OUTPATIENT_CLINIC_OR_DEPARTMENT_OTHER): Payer: 59 | Admitting: Physician Assistant

## 2022-05-29 ENCOUNTER — Other Ambulatory Visit: Payer: Self-pay

## 2022-05-29 VITALS — BP 129/84 | HR 109 | Temp 97.8°F | Resp 19 | Ht 67.0 in | Wt 205.0 lb

## 2022-05-29 DIAGNOSIS — D500 Iron deficiency anemia secondary to blood loss (chronic): Secondary | ICD-10-CM

## 2022-05-29 DIAGNOSIS — D649 Anemia, unspecified: Secondary | ICD-10-CM

## 2022-05-29 DIAGNOSIS — K625 Hemorrhage of anus and rectum: Secondary | ICD-10-CM | POA: Diagnosis not present

## 2022-05-29 DIAGNOSIS — Z79899 Other long term (current) drug therapy: Secondary | ICD-10-CM | POA: Diagnosis not present

## 2022-05-29 DIAGNOSIS — D509 Iron deficiency anemia, unspecified: Secondary | ICD-10-CM | POA: Diagnosis not present

## 2022-05-29 NOTE — Patient Instructions (Signed)
[place] at [facility] Discharge Instructions  You were seen today by Tarri Abernethy PA-C for your iron deficiency anemia.  Your blood levels have improved after your IV iron, but your iron levels are still low.  I recommend that we schedule you for an additional IV iron x 2 doses.  We will recheck your blood and iron levels in 3 months along with an office visit.  Continue to follow-up with your GI doctor regarding possible bleeding in your stomach and intestines.  FOLLOW-UP APPOINTMENT: Same-day labs and office visit in 3 months   - - - - - - - - - - - - - - - - - -    Thank you for choosing [place] at [facility] to provide your oncology and hematology care.  To afford each patient quality time with our provider, please arrive at least 15 minutes before your scheduled appointment time.   If you have a lab appointment with the [facility] please come in thru the Main Entrance and check in at the main information desk.  You need to re-schedule your appointment should you arrive 10 or more minutes late.  We strive to give you quality time with our providers, and arriving late affects you and other patients whose appointments are after yours.  Also, if you no show three or more times for appointments you may be dismissed from the clinic at the providers discretion.     Again, thank you for choosing [facility].  Our hope is that these requests will decrease the amount of time that you wait before being seen by our physicians.       _____________________________________________________________  Should you have questions after your visit to [facility], please contact our office at [phone] and follow the prompts.  Our office hours are 8:00 a.m. and 4:30 p.m. Monday - Friday.  Please note that voicemails left after 4:00 p.m. may not be returned until the following business day.  We are closed weekends and  major holidays.  You do have access to a nurse 24-7, just call the main number to the clinic [phone] and do not press any options, hold on the line and a nurse will answer the phone.    For prescription refill requests, have your pharmacy contact our office and allow 72 hours.    Due to Covid, you will need to wear a mask upon entering the hospital. If you do not have a mask, a mask will be given to you at the Main Entrance upon arrival. For doctor visits, patients may have 1 support person age 43 or older with them. For treatment visits, patients can not have anyone with them due to social distancing guidelines and our immunocompromised population.

## 2022-05-30 ENCOUNTER — Encounter: Payer: Self-pay | Admitting: Hematology

## 2022-05-31 ENCOUNTER — Inpatient Hospital Stay: Payer: 59

## 2022-06-01 ENCOUNTER — Ambulatory Visit: Payer: Medicare Other | Admitting: Nurse Practitioner

## 2022-06-07 ENCOUNTER — Inpatient Hospital Stay: Payer: 59

## 2022-06-07 ENCOUNTER — Encounter: Payer: Self-pay | Admitting: Nurse Practitioner

## 2022-06-07 ENCOUNTER — Ambulatory Visit (INDEPENDENT_AMBULATORY_CARE_PROVIDER_SITE_OTHER): Payer: 59 | Admitting: Nurse Practitioner

## 2022-06-07 VITALS — BP 125/82 | HR 107 | Temp 97.8°F | Resp 20 | Ht 67.0 in | Wt 203.0 lb

## 2022-06-07 VITALS — BP 125/67 | HR 90 | Temp 98.9°F | Resp 18

## 2022-06-07 DIAGNOSIS — D62 Acute posthemorrhagic anemia: Secondary | ICD-10-CM

## 2022-06-07 DIAGNOSIS — K625 Hemorrhage of anus and rectum: Secondary | ICD-10-CM | POA: Diagnosis not present

## 2022-06-07 DIAGNOSIS — M25511 Pain in right shoulder: Secondary | ICD-10-CM

## 2022-06-07 DIAGNOSIS — D509 Iron deficiency anemia, unspecified: Secondary | ICD-10-CM | POA: Diagnosis not present

## 2022-06-07 DIAGNOSIS — D500 Iron deficiency anemia secondary to blood loss (chronic): Secondary | ICD-10-CM

## 2022-06-07 DIAGNOSIS — M25512 Pain in left shoulder: Secondary | ICD-10-CM | POA: Diagnosis not present

## 2022-06-07 DIAGNOSIS — Z79899 Other long term (current) drug therapy: Secondary | ICD-10-CM | POA: Diagnosis not present

## 2022-06-07 MED ORDER — SODIUM CHLORIDE 0.9 % IV SOLN
510.0000 mg | Freq: Once | INTRAVENOUS | Status: AC
  Administered 2022-06-07: 510 mg via INTRAVENOUS
  Filled 2022-06-07: qty 510

## 2022-06-07 MED ORDER — METHYLPREDNISOLONE SODIUM SUCC 125 MG IJ SOLR
125.0000 mg | Freq: Once | INTRAMUSCULAR | Status: AC
  Administered 2022-06-07: 125 mg via INTRAVENOUS
  Filled 2022-06-07: qty 2

## 2022-06-07 MED ORDER — SODIUM CHLORIDE 0.9 % IV SOLN: Freq: Once | INTRAVENOUS | Status: AC

## 2022-06-07 MED ORDER — LORATADINE 10 MG PO TABS
10.0000 mg | ORAL_TABLET | Freq: Once | ORAL | Status: AC
  Administered 2022-06-07: 10 mg via ORAL
  Filled 2022-06-07: qty 1

## 2022-06-07 MED ORDER — FAMOTIDINE IN NACL 20-0.9 MG/50ML-% IV SOLN
20.0000 mg | Freq: Once | INTRAVENOUS | Status: AC
  Administered 2022-06-07: 20 mg via INTRAVENOUS

## 2022-06-07 NOTE — Patient Instructions (Signed)
Shoulder Pain Many things can cause shoulder pain, including: An injury to the shoulder. Overuse of the shoulder. Arthritis. The source of the pain can be: Inflammation. An injury to the shoulder joint. An injury to a tendon, ligament, or bone. Follow these instructions at home: Pay attention to changes in your symptoms. Let your health care provider know about them. Follow these instructions to relieve your pain. If you have a sling: Wear the sling as told by your health care provider. Remove it only as told by your health care provider. Loosen the sling if your fingers tingle, become numb, or turn cold and blue. Keep the sling clean. If the sling is not waterproof: Do not let it get wet. Remove it to shower or bathe. Move your arm as little as possible, but keep your hand moving to prevent swelling. Managing pain, stiffness, and swelling  If directed, put ice on the painful area: Put ice in a plastic bag. Place a towel between your skin and the bag. Leave the ice on for 20 minutes, 2-3 times per day. Stop applying ice if it does not help with the pain. Squeeze a soft ball or a foam pad as much as possible. This helps to keep the shoulder from swelling. It also helps to strengthen the arm. General instructions Take over-the-counter and prescription medicines only as told by your health care provider. Keep all follow-up visits as told by your health care provider. This is important. Contact a health care provider if: Your pain gets worse. Your pain is not relieved with medicines. New pain develops in your arm, hand, or fingers. Get help right away if: Your arm, hand, or fingers: Tingle. Become numb. Become swollen. Become painful. Turn white or blue. Summary Shoulder pain can be caused by an injury, overuse, or arthritis. Pay attention to changes in your symptoms. Let your health care provider know about them. This condition may be treated with a sling, ice, and pain  medicines. Contact your health care provider if the pain gets worse or new pain develops. Get help right away if your arm, hand, or fingers tingle or become numb, swollen, or painful. Keep all follow-up visits as told by your health care provider. This is important. This information is not intended to replace advice given to you by your health care provider. Make sure you discuss any questions you have with your health care provider. Document Revised: 01/13/2021 Document Reviewed: 01/13/2021 Elsevier Patient Education  [place].

## 2022-06-07 NOTE — Progress Notes (Signed)
Patient presents today for Feraheme infusion per providers order.  Vital signs WNL.  Patient has no new complaints at this time.    Peripheral IV started and blood noted pre and post infusion.    Stable during infusion without adverse affects.  Vital signs stable.  No complaints at this time.  Discharge from clinic ambulatory in stable condition.  Alert and oriented X 3.  Follow up with [facility] as scheduled.

## 2022-06-07 NOTE — Patient Instructions (Signed)
[facility]  Discharge Instructions: Thank you for choosing [facility] to provide your oncology and hematology care.  If you have a lab appointment with the Cancer Center, please come in thru the Main Entrance and check in at the main information desk.  Wear comfortable clothing and clothing appropriate for easy access to any Portacath or PICC line.   We strive to give you quality time with your provider. You may need to reschedule your appointment if you arrive late (15 or more minutes).  Arriving late affects you and other patients whose appointments are after yours.  Also, if you miss three or more appointments without notifying the office, you may be dismissed from the clinic at the provider's discretion.      For prescription refill requests, have your pharmacy contact our office and allow 72 hours for refills to be completed.    Today you received the following chemotherapy and/or immunotherapy agents feraheme      To help prevent nausea and vomiting after your treatment, we encourage you to take your nausea medication as directed.  BELOW ARE SYMPTOMS THAT SHOULD BE REPORTED IMMEDIATELY: *FEVER GREATER THAN 100.4 F (38 C) OR HIGHER *CHILLS OR SWEATING *NAUSEA AND VOMITING THAT IS NOT CONTROLLED WITH YOUR NAUSEA MEDICATION *UNUSUAL SHORTNESS OF BREATH *UNUSUAL BRUISING OR BLEEDING *URINARY PROBLEMS (pain or burning when urinating, or frequent urination) *BOWEL PROBLEMS (unusual diarrhea, constipation, pain near the anus) TENDERNESS IN MOUTH AND THROAT WITH OR WITHOUT PRESENCE OF ULCERS (sore throat, sores in mouth, or a toothache) UNUSUAL RASH, SWELLING OR PAIN  UNUSUAL VAGINAL DISCHARGE OR ITCHING   Items with * indicate a potential emergency and should be followed up as soon as possible or go to the Emergency Department if any problems should occur.  Please show the CHEMOTHERAPY ALERT CARD or IMMUNOTHERAPY ALERT CARD at check-in to the  Emergency Department and triage nurse.  Should you have questions after your visit or need to cancel or reschedule your appointment, please contact [facility] [phone]  and follow the prompts.  Office hours are 8:00 a.m. to 4:30 p.m. Monday - Friday. Please note that voicemails left after 4:00 p.m. may not be returned until the following business day.  We are closed weekends and major holidays. You have access to a nurse at all times for urgent questions. Please call the main number to the clinic [phone] and follow the prompts.  For any non-urgent questions, you may also contact your provider using MyChart. We now offer e-Visits for anyone 18 and older to request care online for non-urgent symptoms. For details visit [email or web address].   Also download the MyChart app! Go to the app store, search "MyChart", open the app, select [facility], and log in with your MyChart username and password.   

## 2022-06-07 NOTE — Progress Notes (Signed)
Subjective:    Patient ID: Brittany Durham, female    DOB: August 06, 1970, 52 y.o.   MRN: [id number]   Chief Complaint: Bilateral shoulder, arm, and hand pain   HPI Patient come sin c/o bil shoulder and arm pain. Pain can go all  the way down to hands. She has had cervical surgery in the past. She says pain is on both sides at the same time. Rates pain 0/10 currently . Pain is intermittent and can get to 10/10. She takes tylenol which helps.    Review of Systems  Constitutional:  Negative for diaphoresis.  Eyes:  Negative for pain.  Respiratory:  Negative for shortness of breath.   Cardiovascular:  Negative for chest pain, palpitations and leg swelling.  Gastrointestinal:  Negative for abdominal pain.  Endocrine: Negative for polydipsia.  Skin:  Negative for rash.  Neurological:  Negative for dizziness, weakness and headaches.  Hematological:  Does not bruise/bleed easily.  All other systems reviewed and are negative.      Objective:   Physical Exam Vitals and nursing note reviewed.  Constitutional:      General: She is not in acute distress.    Appearance: Normal appearance. She is well-developed.  Neck:     Vascular: No carotid bruit or JVD.  Cardiovascular:     Rate and Rhythm: Normal rate and regular rhythm.     Heart sounds: Normal heart sounds.  Pulmonary:     Effort: Pulmonary effort is normal. No respiratory distress.     Breath sounds: Normal breath sounds. No wheezing or rales.  Chest:     Chest wall: No tenderness.  Abdominal:     General: Bowel sounds are normal. There is no distension or abdominal bruit.     Palpations: Abdomen is soft. There is no hepatomegaly, splenomegaly, mass or pulsatile mass.     Tenderness: There is no abdominal tenderness.  Musculoskeletal:        General: Normal range of motion.     Cervical back: Normal range of motion and neck supple.  Lymphadenopathy:     Cervical: No cervical adenopathy.  Skin:    General: Skin is warm and  dry.  Neurological:     Mental Status: She is alert and oriented to person, place, and time.     Deep Tendon Reflexes: Reflexes are normal and symmetric.  Psychiatric:        Behavior: Behavior normal.        Thought Content: Thought content normal.        Judgment: Judgment normal.     BP 125/82   Pulse (!) 107   Temp 97.8 F (36.6 C) (Temporal)   Resp 20   Ht '5\' 7"'$  (1.702 m)   Wt 203 lb (92.1 kg)   LMP 09/16/2017   SpO2 95%   BMI 31.79 kg/m        Assessment & Plan:   Brittany Durham in today with chief complaint of Bilateral shoulder, arm, and hand pain   1. Acute pain of both shoulders Moist heat Rest Cannot do NSAIDS due to history of GI bleed Tylenol as needed OTC If reoccurs- come in during episodes so we can treat with toradol pr steroids    The above assessment and management plan was discussed with the patient. The patient verbalized understanding of and has agreed to the management plan. Patient is aware to call the clinic if symptoms persist or worsen. Patient is aware when to return to the clinic for  a follow-up visit. Patient educated on when it is appropriate to go to the emergency department.   Brittany Durham Done, FNP

## 2022-06-15 ENCOUNTER — Inpatient Hospital Stay: Payer: 59 | Attending: Hematology

## 2022-06-15 VITALS — BP 120/71 | HR 99 | Temp 98.9°F | Resp 18

## 2022-06-15 DIAGNOSIS — D509 Iron deficiency anemia, unspecified: Secondary | ICD-10-CM | POA: Diagnosis not present

## 2022-06-15 DIAGNOSIS — D500 Iron deficiency anemia secondary to blood loss (chronic): Secondary | ICD-10-CM

## 2022-06-15 DIAGNOSIS — D62 Acute posthemorrhagic anemia: Secondary | ICD-10-CM

## 2022-06-15 MED ORDER — SODIUM CHLORIDE 0.9 % IV SOLN: Freq: Once | INTRAVENOUS | Status: AC

## 2022-06-15 MED ORDER — METHYLPREDNISOLONE SODIUM SUCC 125 MG IJ SOLR
125.0000 mg | Freq: Once | INTRAMUSCULAR | Status: AC
  Administered 2022-06-15: 125 mg via INTRAVENOUS
  Filled 2022-06-15: qty 2

## 2022-06-15 MED ORDER — FAMOTIDINE IN NACL 20-0.9 MG/50ML-% IV SOLN
20.0000 mg | Freq: Once | INTRAVENOUS | Status: AC
  Administered 2022-06-15: 20 mg via INTRAVENOUS
  Filled 2022-06-15: qty 50

## 2022-06-15 MED ORDER — LORATADINE 10 MG PO TABS: 10.0000 mg | ORAL_TABLET | Freq: Once | ORAL | Status: DC

## 2022-06-15 MED ORDER — CETIRIZINE HCL 10 MG PO TABS
10.0000 mg | ORAL_TABLET | Freq: Once | ORAL | Status: AC
  Administered 2022-06-15: 10 mg via ORAL
  Filled 2022-06-15: qty 1

## 2022-06-15 MED ORDER — SODIUM CHLORIDE 0.9 % IV SOLN
510.0000 mg | Freq: Once | INTRAVENOUS | Status: AC
  Administered 2022-06-15: 510 mg via INTRAVENOUS
  Filled 2022-06-15: qty 17

## 2022-06-15 NOTE — Progress Notes (Signed)
Stable during Feraheme infusion without adverse affects.  Vital signs stable.  No complaints at this time.  Discharge from clinic ambulatory in stable condition.  Alert and oriented X 3.  Follow up with [facility] as scheduled.

## 2022-06-15 NOTE — Patient Instructions (Signed)
[facility]  Discharge Instructions: Thank you for choosing [facility] to provide your oncology and hematology care.  If you have a lab appointment with the Cancer Center, please come in thru the Main Entrance and check in at the main information desk.  Wear comfortable clothing and clothing appropriate for easy access to any Portacath or PICC line.   We strive to give you quality time with your provider. You may need to reschedule your appointment if you arrive late (15 or more minutes).  Arriving late affects you and other patients whose appointments are after yours.  Also, if you miss three or more appointments without notifying the office, you may be dismissed from the clinic at the provider's discretion.      For prescription refill requests, have your pharmacy contact our office and allow 72 hours for refills to be completed.    Today you received the following chemotherapy and/or immunotherapy agents Feraheme      To help prevent nausea and vomiting after your treatment, we encourage you to take your nausea medication as directed.  BELOW ARE SYMPTOMS THAT SHOULD BE REPORTED IMMEDIATELY: *FEVER GREATER THAN 100.4 F (38 C) OR HIGHER *CHILLS OR SWEATING *NAUSEA AND VOMITING THAT IS NOT CONTROLLED WITH YOUR NAUSEA MEDICATION *UNUSUAL SHORTNESS OF BREATH *UNUSUAL BRUISING OR BLEEDING *URINARY PROBLEMS (pain or burning when urinating, or frequent urination) *BOWEL PROBLEMS (unusual diarrhea, constipation, pain near the anus) TENDERNESS IN MOUTH AND THROAT WITH OR WITHOUT PRESENCE OF ULCERS (sore throat, sores in mouth, or a toothache) UNUSUAL RASH, SWELLING OR PAIN  UNUSUAL VAGINAL DISCHARGE OR ITCHING   Items with * indicate a potential emergency and should be followed up as soon as possible or go to the Emergency Department if any problems should occur.  Please show the CHEMOTHERAPY ALERT CARD or IMMUNOTHERAPY ALERT CARD at check-in to the  Emergency Department and triage nurse.  Should you have questions after your visit or need to cancel or reschedule your appointment, please contact [facility] AT  [phone]  and follow the prompts.  Office hours are 8:00 a.m. to 4:30 p.m. Monday - Friday. Please note that voicemails left after 4:00 p.m. may not be returned until the following business day.  We are closed weekends and major holidays. You have access to a nurse at all times for urgent questions. Please call the main number to the clinic [phone] and follow the prompts.  For any non-urgent questions, you may also contact your provider using MyChart. We now offer e-Visits for anyone 18 and older to request care online for non-urgent symptoms. For details visit [email or web address].   Also download the MyChart app! Go to the app store, search "MyChart", open the app, select [facility], and log in with your MyChart username and password.   

## 2022-06-15 NOTE — Progress Notes (Signed)
Patient presents today for Feraheme IV iron infusion. Vital signs are stable. Patient denies any changes since the last iron infusion. Patient denies any complaints today. MAR reviewed and updated.

## 2022-07-16 DIAGNOSIS — R350 Frequency of micturition: Secondary | ICD-10-CM | POA: Diagnosis not present

## 2022-07-16 DIAGNOSIS — M545 Low back pain, unspecified: Secondary | ICD-10-CM | POA: Diagnosis not present

## 2022-07-16 DIAGNOSIS — N3000 Acute cystitis without hematuria: Secondary | ICD-10-CM | POA: Diagnosis not present

## 2022-07-22 DIAGNOSIS — M5489 Other dorsalgia: Secondary | ICD-10-CM | POA: Diagnosis not present

## 2022-07-22 DIAGNOSIS — E785 Hyperlipidemia, unspecified: Secondary | ICD-10-CM | POA: Diagnosis not present

## 2022-07-22 DIAGNOSIS — M461 Sacroiliitis, not elsewhere classified: Secondary | ICD-10-CM | POA: Diagnosis not present

## 2022-07-22 DIAGNOSIS — N3001 Acute cystitis with hematuria: Secondary | ICD-10-CM | POA: Diagnosis not present

## 2022-07-22 DIAGNOSIS — M545 Low back pain, unspecified: Secondary | ICD-10-CM | POA: Diagnosis not present

## 2022-07-22 DIAGNOSIS — N3000 Acute cystitis without hematuria: Secondary | ICD-10-CM | POA: Diagnosis not present

## 2022-07-22 DIAGNOSIS — R109 Unspecified abdominal pain: Secondary | ICD-10-CM | POA: Diagnosis not present

## 2022-07-22 DIAGNOSIS — Z79899 Other long term (current) drug therapy: Secondary | ICD-10-CM | POA: Diagnosis not present

## 2022-07-22 DIAGNOSIS — I10 Essential (primary) hypertension: Secondary | ICD-10-CM | POA: Diagnosis not present

## 2022-07-22 DIAGNOSIS — R000 Tachycardia, unspecified: Secondary | ICD-10-CM | POA: Diagnosis not present

## 2022-07-23 ENCOUNTER — Telehealth: Payer: Self-pay

## 2022-07-23 NOTE — Transitions of Care (Post Inpatient/ED Visit) (Signed)
   07/23/2022  Name: Liylah Najarro MRN: [id number] DOB: [birth year]  Today's TOC FU Call Status: Today's TOC FU Call Status:: Successful TOC FU Call Competed TOC FU Call Complete Date: 07/23/22  Transition Care Management Follow-up Telephone Call Date of Discharge: 07/22/22 Discharge Facility: Other ([facility]) Name of Other (Non-[facility]) Discharge Facility: [facility] Type of Discharge: Emergency Department Reason for ED Visit: Other: (UTI) How have you been since you were released from the hospital?: Same Any questions or concerns?: No  Items Reviewed: Did you receive and understand the discharge instructions provided?: Yes Medications obtained and verified?: Yes (Medications Reviewed) Any new allergies since your discharge?: No Dietary orders reviewed?: NA Do you have support at home?: Yes People in Home: other relative(s)  Home Care and Equipment/Supplies: [place] Ordered?: NA Any new equipment or medical supplies ordered?: NA  Functional Questionnaire: Do you need assistance with bathing/showering or dressing?: No Do you need assistance with meal preparation?: No Do you need assistance with eating?: No Do you have difficulty maintaining continence: No Do you need assistance with getting out of bed/getting out of a chair/moving?: No Do you have difficulty managing or taking your medications?: No  Folllow up appointments reviewed: PCP Follow-up appointment confirmed?: No (no avail appt times, sent message to staff to schedule) MD Provider Line Number:[phone] Given: No [facility] Follow-up appointment confirmed?: NA Do you need transportation to your follow-up appointment?: No Do you understand care options if your condition(s) worsen?: Yes-patient verbalized understanding    [place], [place] Nurse Health Advisor Direct Dial [phone]

## 2022-07-24 DIAGNOSIS — M722 Plantar fascial fibromatosis: Secondary | ICD-10-CM | POA: Diagnosis not present

## 2022-07-26 ENCOUNTER — Encounter: Payer: Self-pay | Admitting: Family

## 2022-07-26 ENCOUNTER — Ambulatory Visit (INDEPENDENT_AMBULATORY_CARE_PROVIDER_SITE_OTHER): Payer: 59 | Admitting: Family

## 2022-07-26 ENCOUNTER — Ambulatory Visit (HOSPITAL_COMMUNITY)
Admission: RE | Admit: 2022-07-26 | Discharge: 2022-07-26 | Disposition: A | Discharge status: Home / Self Care | Payer: 59 | Source: Ambulatory Visit | Attending: Family | Admitting: Family

## 2022-07-26 ENCOUNTER — Other Ambulatory Visit: Payer: Self-pay | Admitting: Family

## 2022-07-26 VITALS — BP 125/85 | HR 118 | Temp 97.4°F | Ht 67.0 in | Wt 204.8 lb

## 2022-07-26 DIAGNOSIS — N3001 Acute cystitis with hematuria: Secondary | ICD-10-CM | POA: Diagnosis not present

## 2022-07-26 DIAGNOSIS — R000 Tachycardia, unspecified: Secondary | ICD-10-CM

## 2022-07-26 DIAGNOSIS — Z09 Encounter for follow-up examination after completed treatment for conditions other than malignant neoplasm: Secondary | ICD-10-CM

## 2022-07-26 DIAGNOSIS — Z791 Long term (current) use of non-steroidal anti-inflammatories (NSAID): Secondary | ICD-10-CM | POA: Diagnosis not present

## 2022-07-26 DIAGNOSIS — Z79899 Other long term (current) drug therapy: Secondary | ICD-10-CM | POA: Diagnosis not present

## 2022-07-26 DIAGNOSIS — M5442 Lumbago with sciatica, left side: Secondary | ICD-10-CM

## 2022-07-26 DIAGNOSIS — R109 Unspecified abdominal pain: Secondary | ICD-10-CM | POA: Diagnosis not present

## 2022-07-26 DIAGNOSIS — M1990 Unspecified osteoarthritis, unspecified site: Secondary | ICD-10-CM | POA: Diagnosis not present

## 2022-07-26 DIAGNOSIS — Z7984 Long term (current) use of oral hypoglycemic drugs: Secondary | ICD-10-CM | POA: Diagnosis not present

## 2022-07-26 DIAGNOSIS — F17210 Nicotine dependence, cigarettes, uncomplicated: Secondary | ICD-10-CM | POA: Diagnosis not present

## 2022-07-26 DIAGNOSIS — K5730 Diverticulosis of large intestine without perforation or abscess without bleeding: Secondary | ICD-10-CM | POA: Diagnosis not present

## 2022-07-26 LAB — URINALYSIS, COMPLETE
Bilirubin, UA: NEGATIVE
Glucose, UA: NEGATIVE
Ketones, UA: NEGATIVE
Nitrite, UA: NEGATIVE
Protein,UA: NEGATIVE
Specific Gravity, UA: 1.015 (ref 1.005–1.030)
Urobilinogen, Ur: 0.2 mg/dL (ref 0.2–1.0)
pH, UA: 6 (ref 5.0–7.5)

## 2022-07-26 LAB — MICROSCOPIC EXAMINATION
RBC, Urine: NONE SEEN /hpf (ref 0–2)
Renal Epithel, UA: NONE SEEN /hpf

## 2022-07-26 MED ORDER — KETOROLAC TROMETHAMINE 60 MG/2ML IM SOLN
60.0000 mg | Freq: Once | INTRAMUSCULAR | Status: AC
  Administered 2022-07-26: 60 mg via INTRAMUSCULAR

## 2022-07-26 MED ORDER — CEFTRIAXONE SODIUM 1 G IJ SOLR
1.0000 g | Freq: Once | INTRAMUSCULAR | Status: AC
  Administered 2022-07-26: 1 g via INTRAMUSCULAR

## 2022-07-26 MED ORDER — PREDNISONE 10 MG (21) PO TBPK: ORAL_TABLET | ORAL | 0 refills | Status: DC

## 2022-07-26 MED ORDER — DICLOFENAC SODIUM 75 MG PO TBEC: 75.0000 mg | DELAYED_RELEASE_TABLET | Freq: Two times a day (BID) | ORAL | 0 refills | Status: DC

## 2022-07-26 NOTE — Progress Notes (Addendum)
Subjective:    Patient ID: Brittany Durham, female    DOB: [birth year], 52 y.o.   MRN: [id number]  Chief Complaint  Patient presents with   Hospitalization Follow-up   Pt presents to the office today for hospital follow up. She went to the ED for 07/22/22 with left sided low back pain with sciatic. She was diagnosed with a UTI and back pain. She was told she "might" have a kidney stone, but no CT was performed.   She was given cipro.  Urinary Tract Infection  This is a new problem. The current episode started in the past 7 days. The problem occurs every urination. The problem has been waxing and waning. The pain is moderate. There has been no fever. Associated symptoms include frequency and urgency. Pertinent negatives include no hematuria, nausea or vomiting. She has tried antibiotics and increased fluids for the symptoms. The treatment provided mild relief.  Back Pain This is a new problem. The current episode started 1 to 4 weeks ago. The problem has been waxing and waning since onset. The pain is present in the lumbar spine and gluteal. The quality of the pain is described as aching. The pain radiates to the left foot. The pain is at a severity of 10/10. The pain is moderate. The symptoms are aggravated by standing. Associated symptoms include leg pain. She has tried bed rest for the symptoms. The treatment provided mild relief.      Review of Systems  Gastrointestinal:  Negative for nausea and vomiting.  Genitourinary:  Positive for frequency and urgency. Negative for hematuria.  Musculoskeletal:  Positive for back pain.  All other systems reviewed and are negative.  Family History  Problem Relation Age of Onset   Hypertension Mother    Lung cancer Father    Hypertension Brother    Colon cancer Neg Hx    Gastric cancer Neg Hx    Esophageal cancer Neg Hx    Social History   Socioeconomic History   Marital status: Single    Spouse name: Not on file   Number of children: 1    Years of education: 12   Highest education level: Not on file  Occupational History   Occupation: Umemployed    Comment: Disability  Tobacco Use   Smoking status: Never   Smokeless tobacco: Never  Vaping Use   Vaping Use: Never used  Substance and Sexual Activity   Alcohol use: No   Drug use: No   Sexual activity: Not Currently    Birth control/protection: Surgical  Other Topics Concern   Not on file  Social History Narrative   Lives at home alone.   Disabled - speech difficulties, cannot drive   Right-handed.   Occasional caffeine use.   Her daughter lives about 30-45 minutes away   She has lots of family nearby - uncle, mother, sister, brother   Social Determinants of Health   Financial Resource Strain: [place]  (12/14/2021)   Overall Financial Resource Strain (CARDIA)    Difficulty of Paying Living Expenses: Not hard at all  Food Insecurity: No Food Insecurity (12/14/2021)   Hunger Vital Sign    Worried About Running Out of Food in the Last Year: Never true    [place] in the Last Year: Never true  Transportation Needs: No Transportation Needs (12/14/2021)   PRAPARE - Hydrologist (Medical): No    Lack of Transportation (Non-Medical): No  Physical Activity: Insufficiently Active (  12/14/2021)   Exercise Vital Sign    Days of Exercise per Week: 2 days    Minutes of Exercise per Session: 20 min  Stress: No Stress Concern Present (12/14/2021)   [place]    Feeling of Stress : Not at all  Social Connections: Moderately Isolated (12/14/2021)   Social Connection and Isolation Panel [NHANES]    Frequency of Communication with Friends and Family: More than three times a week    Frequency of Social Gatherings with Friends and Family: Three times a week    Attends Religious Services: More than 4 times per year    Active Member of Clubs or Organizations: No    Attends Theatre manager Meetings: Never    Marital Status: Never married       Objective:   Physical Exam Vitals reviewed.  Constitutional:      General: She is not in acute distress.    Appearance: She is well-developed.  HENT:     Head: Normocephalic and atraumatic.     Right Ear: Tympanic membrane normal.     Left Ear: Tympanic membrane normal.  Eyes:     Pupils: Pupils are equal, round, and reactive to light.  Neck:     Thyroid: No thyromegaly.  Cardiovascular:     Rate and Rhythm: Regular rhythm. Tachycardia present.     Heart sounds: Normal heart sounds. No murmur heard. Pulmonary:     Effort: Pulmonary effort is normal. No respiratory distress.     Breath sounds: Normal breath sounds. No wheezing.  Abdominal:     General: Bowel sounds are normal. There is no distension.     Palpations: Abdomen is soft.     Tenderness: There is no abdominal tenderness. There is left CVA tenderness.  Musculoskeletal:        General: No tenderness. Normal range of motion.     Cervical back: Normal range of motion and neck supple.  Skin:    General: Skin is warm and dry.  Neurological:     Mental Status: She is alert and oriented to person, place, and time.     Cranial Nerves: No cranial nerve deficit.     Deep Tendon Reflexes: Reflexes are normal and symmetric.  Psychiatric:        Behavior: Behavior normal.        Thought Content: Thought content normal.        Judgment: Judgment normal.        BP 125/85   Pulse (!) 118   Temp (!) 97.4 F (36.3 C) (Temporal)   Ht '5\' 7"'$  (1.702 m)   Wt 204 lb 12.8 oz (92.9 kg)   LMP 09/16/2017   SpO2 100%   BMI 32.08 kg/m   Assessment & Plan:  Brittany Durham comes in today with chief complaint of Hospitalization Follow-up   Diagnosis and orders addressed:  1. Acute cystitis with hematuria - CT RENAL STONE STUDY; Future - Urinalysis, Complete  2. Flank pain - CT RENAL STONE STUDY; Future - Urinalysis, Complete - cefTRIAXone (ROCEPHIN)  injection 1 g  3. Acute left-sided low back pain with left-sided sciatica - CT RENAL STONE STUDY; Future - ketorolac (TORADOL) injection 60 mg  4. Hospital discharge follow-up - CT RENAL STONE STUDY; Future  5. Abdominal pain, unspecified abdominal location  6. Tachycardia   Labs pending Given CVA tenderness and the amount of pain patient is in will do CT scan.  Urine mild,  but not consistent with the amount of pain.  Unsure if pain is from kidney or MSK? Will give Toradol for pain relief.  Approx 45 mins spent with patient and reviewing tests Diet and exercise encouraged  Follow up plan: 1-2 weeks   Evelina Dun, FNP

## 2022-07-26 NOTE — Addendum Note (Signed)
Addended by: Evelina Dun A on: 07/26/2022 11:38 AM   Modules accepted: Level of Service

## 2022-08-28 ENCOUNTER — Inpatient Hospital Stay: Payer: 59 | Attending: Hematology

## 2022-08-28 DIAGNOSIS — I351 Nonrheumatic aortic (valve) insufficiency: Secondary | ICD-10-CM | POA: Diagnosis not present

## 2022-08-28 DIAGNOSIS — D649 Anemia, unspecified: Secondary | ICD-10-CM

## 2022-08-28 DIAGNOSIS — R55 Syncope and collapse: Secondary | ICD-10-CM | POA: Diagnosis not present

## 2022-08-28 DIAGNOSIS — I10 Essential (primary) hypertension: Secondary | ICD-10-CM | POA: Diagnosis not present

## 2022-08-28 DIAGNOSIS — D509 Iron deficiency anemia, unspecified: Secondary | ICD-10-CM | POA: Diagnosis not present

## 2022-08-28 DIAGNOSIS — D500 Iron deficiency anemia secondary to blood loss (chronic): Secondary | ICD-10-CM

## 2022-08-28 LAB — IRON AND TIBC
Iron: 38 ug/dL (ref 28–170)
Saturation Ratios: 9 % — ABNORMAL LOW (ref 10.4–31.8)
TIBC: 412 ug/dL (ref 250–450)
UIBC: 374 ug/dL

## 2022-08-28 LAB — CBC WITH DIFFERENTIAL/PLATELET
Abs Immature Granulocytes: 0.03 10*3/uL (ref 0.00–0.07)
Basophils Absolute: 0 10*3/uL (ref 0.0–0.1)
Basophils Relative: 0 %
Eosinophils Absolute: 0.3 10*3/uL (ref 0.0–0.5)
Eosinophils Relative: 4 %
HCT: 32.7 % — ABNORMAL LOW (ref 36.0–46.0)
Hemoglobin: 10.3 g/dL — ABNORMAL LOW (ref 12.0–15.0)
Immature Granulocytes: 0 %
Lymphocytes Relative: 27 %
Lymphs Abs: 2.3 10*3/uL (ref 0.7–4.0)
MCH: 28.8 pg (ref 26.0–34.0)
MCHC: 31.5 g/dL (ref 30.0–36.0)
MCV: 91.3 fL (ref 80.0–100.0)
Monocytes Absolute: 0.9 10*3/uL (ref 0.1–1.0)
Monocytes Relative: 10 %
Neutro Abs: 5.1 10*3/uL (ref 1.7–7.7)
Neutrophils Relative %: 59 %
Platelets: 276 10*3/uL (ref 150–400)
RBC: 3.58 MIL/uL — ABNORMAL LOW (ref 3.87–5.11)
RDW: 13.2 % (ref 11.5–15.5)
WBC: 8.6 10*3/uL (ref 4.0–10.5)
nRBC: 0 % (ref 0.0–0.2)

## 2022-08-28 LAB — FERRITIN: Ferritin: 10 ng/mL — ABNORMAL LOW (ref 11–307)

## 2022-08-28 NOTE — Progress Notes (Unsigned)
[facility] [address], [place] [address]   CLINIC:  Medical Oncology/Hematology  PCP:  [name], FNP [address] [place] [address] [phone]   REASON FOR VISIT:  Follow-up for iron deficiency anemia   CURRENT THERAPY: Intermittent IV iron  INTERVAL HISTORY:   Brittany Durham 52 y.o. female returns for routine follow-up of her iron deficiency anemia.  She was last seen by Rojelio Brenner PA-C on 05/29/2022.   At today's visit, she reports feeling poorly due to back pain.   She received IV Feraheme x 2 in January/February 2024, and reports feeling transient increase in energy after IV iron.  She had recurrent fatigue after a few weeks.  She reports occasional chest pain (none today) and palpitations.  No pica, dyspnea, lightheadedness, or syncope.  She has not noticed any recent rectal bleeding or melena since her colonoscopy in November 2023.  She has not had any further vaginal bleeding since her isolated episode in late 2022.  She has little to no energy and 70% appetite. She endorses that she is maintaining a stable weight.  ASSESSMENT & PLAN:  1.  Iron deficiency anemia with suspected occult GI bleed - ED on 07/18/2020 with Hgb 5.7, fatigue, lightheadedness.  Denied overt GI bleeding and was Hemoccult-negative, but does have a history of peptic ulcer disease.  She was transfused 2 units PRBCs on 07/18/2020, and followed up with gastroenterology.  - Colonoscopy (08/24/2020) showed diverticulosis, but was otherwise normal. - EGD (09/20/2020): Large hiatal hernia, Cameron lesions and antral erosions (suspicious for causing intermittent bleeding in the setting of ongoing NSAID use per GI note) - EGD and colonoscopy at [facility] on 03/30/2022, which showed multiple scattered pancolonic diverticula of moderate severity otherwise normal colonoscopy; EGD was normal  - She is on Protonix for peptic ulcer disease - Hematology work-up  (07/29/2020) showed Hgb 8.5 (microcytic with MCV 78.2).  Ferritin was 7, with 32% iron saturation.  Reticulocytes 3.4 (reticulocyte index 1.61, consistent with hypoproliferation in the setting of iron deficiency).   - Most recent IV iron with Feraheme x2 on 06/07/2022 and 06/15/2022 - Per outside medical records, patient had mild postinfusion reaction with rash on bilateral arms after Venofer infusion on 09/29/2021.  Tolerated Feraheme well with PREMEDICATION. - Intermittent rectal bleeding.  No recent signs of melena. - Follows with [facility] Berna Bue PA-C) - has an upcoming appointment in June - Symptomatic with fatigue - Most recent labs (08/28/2022): Hgb 10.3/MCV 91.3, ferritin 10, iron saturation 9%.   Labs from October 2023 showed CMP unremarkable, LDH normal; normal B12, MMA, folate, SPEP, haptoglobin. - PLAN: Recommend additional IV Feraheme x3 - PREMEDICATE WITH STEROIDS due to possible reaction after most recent Venofer. - Continue GI and GYN follow-up.  - RTC in 3 months for repeat CBC and iron panel followed by office visit  PLAN SUMMARY: >> Feraheme x 3 >> Labs in 3 months = CBC/D, ferritin, iron/TIBC >> OFFICE visit in 3 months    REVIEW OF SYSTEMS:  Review of Systems  Constitutional:  Positive for fatigue. Negative for appetite change, chills, diaphoresis, fever and unexpected weight change.  HENT:   Negative for lump/mass and nosebleeds.   Eyes:  Negative for eye problems.  Respiratory:  Negative for cough, hemoptysis and shortness of breath.   Cardiovascular:  Positive for chest pain (none today) and palpitations. Negative for leg swelling.  Gastrointestinal:  Negative for abdominal pain, blood in stool, constipation,  diarrhea, nausea and vomiting.  Genitourinary:  Negative for hematuria.   Musculoskeletal:  Positive for arthralgias and back pain.  Skin: Negative.   Neurological:  Positive for numbness. Negative for dizziness, headaches and light-headedness.   Hematological:  Does not bruise/bleed easily.  Psychiatric/Behavioral:  Positive for sleep disturbance.      PHYSICAL EXAM:  ECOG PERFORMANCE STATUS: 1 - Symptomatic but completely ambulatory  There were no vitals filed for this visit. There were no vitals filed for this visit. Physical Exam Constitutional:      Appearance: Normal appearance. She is obese.  HENT:     Head: Normocephalic and atraumatic.     Mouth/Throat:     Mouth: Mucous membranes are moist.  Eyes:     Extraocular Movements: Extraocular movements intact.     Pupils: Pupils are equal, round, and reactive to light.  Cardiovascular:     Rate and Rhythm: Regular rhythm.     Pulses: Normal pulses.     Heart sounds: Normal heart sounds.  Pulmonary:     Effort: Pulmonary effort is normal.     Breath sounds: Normal breath sounds.  Abdominal:     General: Bowel sounds are normal.     Palpations: Abdomen is soft.     Tenderness: There is no abdominal tenderness.  Musculoskeletal:        General: No swelling.     Right lower leg: No edema.     Left lower leg: No edema.  Lymphadenopathy:     Cervical: No cervical adenopathy.  Skin:    General: Skin is warm and dry.  Neurological:     General: No focal deficit present.     Mental Status: She is alert and oriented to person, place, and time.  Psychiatric:        Mood and Affect: Mood normal.        Behavior: Behavior normal.     PAST MEDICAL/SURGICAL HISTORY:  Past Medical History:  Diagnosis Date   Anemia    Asthma    childhood   Carotidynia    COPD (chronic obstructive pulmonary disease) (HCC)    DDD (degenerative disc disease), cervical    GERD (gastroesophageal reflux disease)    Hypertension    Knee pain    left meniscus tear s/p repair 11/2014   Seizures (HCC)    last seizure was over 10 years ago, unknown etiology   Stuttering    patient reports this started after her knee surgery in 11/2014.    Thyroid nodule    Past Surgical History:   Procedure Laterality Date   ANTERIOR CERVICAL DECOMP/DISCECTOMY FUSION N/A 03/20/2019   Procedure: Anterior Cervical Decompression Fusion - Cervical Four-Cervical Five - Cervical Five-Cervical Six;  Surgeon: Tia Alert, MD;  Location: [facility] OR;  Service: Neurosurgery;  Laterality: N/A;  Anterior Cervical Decompression Fusion - Cervical Four-Cervical Five - Cervical Five-Cervical Six   BIOPSY  09/20/2020   Procedure: BIOPSY;  Surgeon: Corbin Ade, MD;  Location: [facility] SUITE;  Service: Endoscopy;;   COLONOSCOPY N/A 08/23/2016   Procedure: COLONOSCOPY;  Surgeon: Corbin Ade, MD;  Location: [facility];  Service: Endoscopy;  Laterality: N/A;  1:00pm   COLONOSCOPY N/A 08/24/2020   Diverticulosis in the entire examined colon. Redundant colon. Exam otherwise normal   ESOPHAGOGASTRODUODENOSCOPY N/A 05/03/2016   Procedure: ESOPHAGOGASTRODUODENOSCOPY (EGD);  Surgeon: Corbin Ade, MD;  Location: [facility];  Service: Endoscopy;  Laterality: N/A;   ESOPHAGOGASTRODUODENOSCOPY N/A 08/23/2016   Procedure: ESOPHAGOGASTRODUODENOSCOPY (  EGD);  Surgeon: Corbin Ade, MD;  Location: [facility];  Service: Endoscopy;  Laterality: N/A;   ESOPHAGOGASTRODUODENOSCOPY (EGD) WITH PROPOFOL N/A 09/20/2020   normal esophagus. Large hiatal hernia. Multiple gastric erosions (Cameron lesions). Normal duodenal bulb and second portion of the duodenum. Suspicion Sheria Lang lesions causing intermittent bleeding   KNEE ARTHROSCOPY Right    KNEE ARTHROSCOPY WITH MEDIAL MENISECTOMY Left 12/09/2014   Procedure: KNEE ARTHROSCOPY WITH PARTIAL MEDIAL AND LATERAL MENISECTOMY;  Surgeon: Darreld Mclean, MD;  Location: [facility] ORS;  Service: Orthopedics;  Laterality: Left;   TUBAL LIGATION     WISDOM TOOTH EXTRACTION      SOCIAL HISTORY:  Social History   Socioeconomic History   Marital status: Single    Spouse name: Not on file   Number of children: 1   Years of education: 12   Highest education level: Not on file   Occupational History   Occupation: Umemployed    Comment: Disability  Tobacco Use   Smoking status: Never   Smokeless tobacco: Never  Vaping Use   Vaping Use: Never used  Substance and Sexual Activity   Alcohol use: No   Drug use: No   Sexual activity: Not Currently    Birth control/protection: Surgical  Other Topics Concern   Not on file  Social History Narrative   Lives at home alone.   Disabled - speech difficulties, cannot drive   Right-handed.   Occasional caffeine use.   Her daughter lives about 30-45 minutes away   She has lots of family nearby - uncle, mother, sister, brother   Social Determinants of Health   Financial Resource Strain: Low Risk  (12/14/2021)   Overall Financial Resource Strain (CARDIA)    Difficulty of Paying Living Expenses: Not hard at all  Food Insecurity: No Food Insecurity (12/14/2021)   Hunger Vital Sign    Worried About Running Out of Food in the Last Year: Never true    Ran Out of Food in the Last Year: Never true  Transportation Needs: No Transportation Needs (12/14/2021)   PRAPARE - Administrator, Civil Service (Medical): No    Lack of Transportation (Non-Medical): No  Physical Activity: Insufficiently Active (12/14/2021)   Exercise Vital Sign    Days of Exercise per Week: 2 days    Minutes of Exercise per Session: 20 min  Stress: No Stress Concern Present (12/14/2021)   [employer] of Occupational Health - Occupational Stress Questionnaire    Feeling of Stress : Not at all  Social Connections: Moderately Isolated (12/14/2021)   Social Connection and Isolation Panel [NHANES]    Frequency of Communication with Friends and Family: More than three times a week    Frequency of Social Gatherings with Friends and Family: Three times a week    Attends Religious Services: More than 4 times per year    Active Member of Clubs or Organizations: No    Attends Banker Meetings: Never    Marital Status: Never married   Intimate Partner Violence: Not At Risk (12/14/2021)   Humiliation, Afraid, Rape, and Kick questionnaire    Fear of Current or Ex-Partner: No    Emotionally Abused: No    Physically Abused: No    Sexually Abused: No    FAMILY HISTORY:  Family History  Problem Relation Age of Onset   Hypertension Mother    Lung cancer Father    Hypertension Brother    Colon cancer Neg Hx    Gastric cancer  Neg Hx    Esophageal cancer Neg Hx     CURRENT MEDICATIONS:  Outpatient Encounter Medications as of 08/29/2022  Medication Sig   amLODipine (NORVASC) 10 MG tablet Take 1 tablet (10 mg total) by mouth daily. (NEEDS TO BE SEEN BEFORE NEXT REFILL)   buPROPion (WELLBUTRIN XL) 150 MG 24 hr tablet Take 1 tablet (150 mg total) by mouth daily.   busPIRone (BUSPAR) 5 MG tablet TAKE ONE TABLET BY MOUTH UP TO THREE TIMES DAILY.   diclofenac (VOLTAREN) 75 MG EC tablet Take 1 tablet (75 mg total) by mouth 2 (two) times daily.   escitalopram (LEXAPRO) 20 MG tablet Take 1 tablet (20 mg total) by mouth daily.   ferrous sulfate 325 (65 FE) MG tablet Take 1 tablet by mouth daily.   lisinopril (ZESTRIL) 20 MG tablet Take 1 tablet (20 mg total) by mouth daily. (NEEDS TO BE SEEN BEFORE NEXT REFILL)   pantoprazole (PROTONIX) 40 MG tablet TAKE 1 TABLET ONCE DAILY BEFORE BREAKFAST   predniSONE (STERAPRED UNI-PAK 21 TAB) 10 MG (21) TBPK tablet Use as directed   No facility-administered encounter medications on file as of 08/29/2022.    ALLERGIES:  Allergies  Allergen Reactions   Acetaminophen Other (See Comments)    MD requests not to be given   Venofer [Iron Sucrose] Rash    LABORATORY DATA:  I have reviewed the labs as listed.  CBC    Component Value Date/Time   WBC 4.9 05/17/2022 1032   RBC 3.99 05/17/2022 1032   HGB 11.2 (L) 05/17/2022 1032   HGB 12.6 10/24/2020 1318   HCT 34.9 (L) 05/17/2022 1032   HCT 39.0 10/24/2020 1318   PLT 254 05/17/2022 1032   PLT 277 10/24/2020 1318   MCV 87.5 05/17/2022 1032    MCV 88 10/24/2020 1318   MCH 28.1 05/17/2022 1032   MCHC 32.1 05/17/2022 1032   RDW 14.0 05/17/2022 1032   RDW 14.8 10/24/2020 1318   LYMPHSABS 1.4 05/17/2022 1032   LYMPHSABS 2.3 10/24/2020 1318   MONOABS 0.4 05/17/2022 1032   EOSABS 0.2 05/17/2022 1032   EOSABS 0.2 10/24/2020 1318   BASOSABS 0.0 05/17/2022 1032   BASOSABS 0.0 10/24/2020 1318      Latest Ref Rng & Units 03/02/2022   11:14 AM 08/10/2021   12:03 PM 12/16/2020   11:35 AM  CMP  Glucose 70 - 99 mg/dL 95  93  161   BUN 6 - 20 mg/dL 11  14  14    Creatinine 0.44 - 1.00 mg/dL 0.96  0.45  4.09   Sodium 135 - 145 mmol/L 140  144  138   Potassium 3.5 - 5.1 mmol/L 3.7  4.2  3.3   Chloride 98 - 111 mmol/L 104  103  102   CO2 22 - 32 mmol/L 27  21  28    Calcium 8.9 - 10.3 mg/dL 9.8  9.9  9.1   Total Protein 6.5 - 8.1 g/dL 7.5  7.4  7.5   Total Bilirubin 0.3 - 1.2 mg/dL 0.4  0.4  0.5   Alkaline Phos 38 - 126 U/L 69  108  82   AST 15 - 41 U/L 14  15  14    ALT 0 - 44 U/L 17  16  16      DIAGNOSTIC IMAGING:  I have independently reviewed the relevant imaging and discussed with the patient.   WRAP UP:  All questions were answered. The patient knows to call the clinic with any  problems, questions or concerns.  Medical decision making: Low  Time spent on visit: I spent 15 minutes counseling the patient face to face. The total time spent in the appointment was 22 minutes and more than 50% was on counseling.  Carnella Guadalajara, PA-C  08/29/22 9:26 AM

## 2022-08-29 ENCOUNTER — Other Ambulatory Visit: Payer: Self-pay

## 2022-08-29 ENCOUNTER — Ambulatory Visit: Payer: 59 | Admitting: Physician Assistant

## 2022-08-29 ENCOUNTER — Other Ambulatory Visit: Payer: 59

## 2022-08-29 ENCOUNTER — Inpatient Hospital Stay (HOSPITAL_BASED_OUTPATIENT_CLINIC_OR_DEPARTMENT_OTHER): Payer: 59 | Admitting: Physician Assistant

## 2022-08-29 VITALS — BP 120/73 | HR 96 | Temp 98.3°F | Resp 18 | Wt 203.0 lb

## 2022-08-29 DIAGNOSIS — D649 Anemia, unspecified: Secondary | ICD-10-CM

## 2022-08-29 DIAGNOSIS — D500 Iron deficiency anemia secondary to blood loss (chronic): Secondary | ICD-10-CM

## 2022-08-29 DIAGNOSIS — D509 Iron deficiency anemia, unspecified: Secondary | ICD-10-CM | POA: Diagnosis not present

## 2022-08-29 DIAGNOSIS — I351 Nonrheumatic aortic (valve) insufficiency: Secondary | ICD-10-CM | POA: Diagnosis not present

## 2022-08-29 DIAGNOSIS — R55 Syncope and collapse: Secondary | ICD-10-CM | POA: Diagnosis not present

## 2022-08-29 DIAGNOSIS — I10 Essential (primary) hypertension: Secondary | ICD-10-CM | POA: Diagnosis not present

## 2022-08-29 NOTE — Patient Instructions (Signed)
[facility] at [facility] Discharge Instructions  You were seen today by Rojelio Brenner PA-C for your iron deficiency anemia.  Your blood levels and iron levels are still low.    I recommend that we schedule you for an additional IV iron x 3 doses.  We will recheck your blood and iron levels in 3 months along with an office visit.  Continue to follow-up with your GI doctor regarding possible bleeding in your stomach and intestines.  FOLLOW-UP APPOINTMENT: Same-day labs and office visit in 3 months   - - - - - - - - - - - - - - - - - -    Thank you for choosing [facility] at [facility] to provide your oncology and hematology care.  To afford each patient quality time with our provider, please arrive at least 15 minutes before your scheduled appointment time.   If you have a lab appointment with the Cancer Center please come in thru the Main Entrance and check in at the main information desk.  You need to re-schedule your appointment should you arrive 10 or more minutes late.  We strive to give you quality time with our providers, and arriving late affects you and other patients whose appointments are after yours.  Also, if you no show three or more times for appointments you may be dismissed from the clinic at the providers discretion.     Again, thank you for choosing [facility].  Our hope is that these requests will decrease the amount of time that you wait before being seen by our physicians.       _____________________________________________________________  Should you have questions after your visit to [facility], please contact our office at [phone] and follow the prompts.  Our office hours are 8:00 a.m. and 4:30 p.m. Monday - Friday.  Please note that voicemails left after 4:00 p.m. may not be returned until the following business day.  We are closed weekends and major holidays.  You do have access  to a nurse 24-7, just call the main number to the clinic [phone] and do not press any options, hold on the line and a nurse will answer the phone.    For prescription refill requests, have your pharmacy contact our office and allow 72 hours.    Due to Covid, you will need to wear a mask upon entering the hospital. If you do not have a mask, a mask will be given to you at the Main Entrance upon arrival. For doctor visits, patients may have 1 support person age 46 or older with them. For treatment visits, patients can not have anyone with them due to social distancing guidelines and our immunocompromised population.

## 2022-08-31 ENCOUNTER — Ambulatory Visit (INDEPENDENT_AMBULATORY_CARE_PROVIDER_SITE_OTHER): Payer: 59

## 2022-08-31 ENCOUNTER — Encounter: Payer: Self-pay | Admitting: Family

## 2022-08-31 ENCOUNTER — Ambulatory Visit (INDEPENDENT_AMBULATORY_CARE_PROVIDER_SITE_OTHER): Payer: 59 | Admitting: Family

## 2022-08-31 ENCOUNTER — Other Ambulatory Visit: Payer: Self-pay | Admitting: Family

## 2022-08-31 VITALS — BP 116/73 | HR 94 | Temp 97.0°F | Ht 67.0 in | Wt 201.0 lb

## 2022-08-31 DIAGNOSIS — F411 Generalized anxiety disorder: Secondary | ICD-10-CM

## 2022-08-31 DIAGNOSIS — M546 Pain in thoracic spine: Secondary | ICD-10-CM

## 2022-08-31 DIAGNOSIS — F321 Major depressive disorder, single episode, moderate: Secondary | ICD-10-CM

## 2022-08-31 MED ORDER — CELECOXIB 200 MG PO CAPS
200.0000 mg | ORAL_CAPSULE | Freq: Two times a day (BID) | ORAL | 1 refills | Status: DC
Start: 2022-08-31 — End: 2022-11-05

## 2022-08-31 MED ORDER — PREDNISONE 10 MG (21) PO TBPK
ORAL_TABLET | ORAL | 0 refills | Status: DC
Start: 2022-08-31 — End: 2023-02-19

## 2022-08-31 MED ORDER — BACLOFEN 10 MG PO TABS
10.0000 mg | ORAL_TABLET | Freq: Three times a day (TID) | ORAL | 0 refills | Status: DC
Start: 2022-08-31 — End: 2022-09-25

## 2022-08-31 NOTE — Progress Notes (Signed)
Subjective:    Patient ID: [name], female    DOB: 1971-02-02, 52 y.o.   MRN: [id number]  Chief Complaint  Patient presents with   Back Pain    No better    Pt presents to the office today with recurrent back pain. She went to the ED for 07/22/22 with left sided low back pain with sciatic. She was diagnosed with a UTI and back pain. She was then seen on 07/26/22 with continued back pain and had a CT renal that was negative. Was treated as thoracic back pain. She was given ketorolac injection that helped.    Back Pain This is a recurrent problem. The current episode started more than 1 month ago. The problem occurs intermittently. The problem has been waxing and waning since onset. The pain is present in the thoracic spine. The quality of the pain is described as aching. The pain is at a severity of 10/10. The pain is moderate. The symptoms are aggravated by position and coughing. She has tried bed rest and NSAIDs for the symptoms. The treatment provided mild relief.      Review of Systems  Musculoskeletal:  Positive for back pain.  All other systems reviewed and are negative.      Objective:   Physical Exam Vitals reviewed.  Constitutional:      General: She is not in acute distress.    Appearance: She is well-developed.  HENT:     Head: Normocephalic and atraumatic.  Eyes:     Pupils: Pupils are equal, round, and reactive to light.  Neck:     Thyroid: No thyromegaly.  Cardiovascular:     Rate and Rhythm: Normal rate and regular rhythm.     Heart sounds: Normal heart sounds. No murmur heard. Pulmonary:     Effort: Pulmonary effort is normal. No respiratory distress.     Breath sounds: Normal breath sounds. No wheezing.  Abdominal:     General: Bowel sounds are normal. There is no distension.     Palpations: Abdomen is soft.     Tenderness: There is no abdominal tenderness.  Musculoskeletal:        General: No tenderness.       Arms:     Cervical back: Normal  range of motion and neck supple.     Comments: Left thoracic pain with rotation and extension  Skin:    General: Skin is warm and dry.  Neurological:     Mental Status: She is alert and oriented to person, place, and time.     Cranial Nerves: No cranial nerve deficit.     Deep Tendon Reflexes: Reflexes are normal and symmetric.  Psychiatric:        Behavior: Behavior normal.        Thought Content: Thought content normal.        Judgment: Judgment normal.       BP 116/73   Pulse 94   Temp (!) 97 F (36.1 C) (Temporal)   Ht  (1.702 m)   Wt 201 lb (91.2 kg)   LMP 09/16/2017   SpO2 95%   BMI 31.48 kg/m      Assessment & Plan:  Braelee Herrle comes in today with chief complaint of Back Pain (No better )   Diagnosis and orders addressed:  1. Acute left-sided thoracic back pain Rest Ice  ROM exercises No other NSAID"s while taking Celebrex Sedation precautions discussed  Follow up if symptoms worsen or do not improve  -  predniSONE (STERAPRED UNI-PAK 21 TAB) 10 MG (21) TBPK tablet; Use as directed  Dispense: 21 tablet; Refill: 0 - celecoxib (CELEBREX) 200 MG capsule; Take 1 capsule (200 mg total) by mouth 2 (two) times daily.  Dispense: 60 capsule; Refill: 1 - baclofen (LIORESAL) 10 MG tablet; Take 1 tablet (10 mg total) by mouth 3 (three) times daily.  Dispense: 30 each; Refill: 0 - DG Thoracic Spine 2 View   Jannifer Rodney, [place]

## 2022-08-31 NOTE — Patient Instructions (Signed)
Thoracic Strain Rehab Ask your health care provider which exercises are safe for you. Do exercises exactly as told by your provider and adjust them as directed. It is normal to feel mild stretching, pulling, tightness, or discomfort as you do these exercises. Stop right away if you feel sudden pain or your pain gets worse. Do not begin these exercises until told by your provider. Stretching and range-of-motion exercise This exercise warms up your muscles and joints and improves the movement and flexibility of your back and shoulders. This exercise also helps to relieve pain. Chest and spine stretch  Lie down on your back on a firm surface. Roll a towel or a small blanket so it is about 4 inches (10 cm) in diameter. Put the towel under the middle of your back so it is under your spine, but not under your shoulder blades. Put your hands behind your head and let your elbows fall to your sides. This will increase your stretch. Take a deep breath (inhale). Hold for __________ seconds. Relax after you breathe out (exhale). Repeat __________ times. Complete this exercise __________ times a day. Strengthening exercises These exercises build strength and endurance in your back and your shoulder blade muscles. Endurance is the ability to use your muscles for a long time, even after they get tired. Alternating arm and leg raises  Get on your hands and knees on a firm surface. If you are on a hard floor, you may want to use padding, such as an exercise mat, to cushion your knees. Line up your arms and legs. Your hands should be directly below your shoulders, and your knees should be directly below your hips. Lift your left leg behind you. At the same time, raise your right arm and straighten it in front of you. Do not lift your leg higher than your hip. Do not lift your arm higher than your shoulder. Keep your abdominal and back muscles tight. Keep your hips facing the ground. Do not arch your  back. Carefully stay balanced. Do not hold your breath. Hold for __________ seconds. Slowly return to the starting position and repeat with your right leg and your left arm. Repeat __________ times. Complete this exercise __________ times a day. Straight arm rows This exercise is also called the shoulder extension exercise. Stand with your feet shoulder width apart. Secure an exercise band to a stable object in front of you so the band is at or above shoulder height. Hold one end of the exercise band in each hand. Straighten your elbows and lift your hands up to shoulder height. Step back, away from the secured end of the exercise band, until the band stretches. Squeeze your shoulder blades together and pull your hands down to the sides of your thighs. Stop when your hands are straight down by your sides. This is shoulder extension. Do not let your hands go behind your body. Hold for __________ seconds. Slowly return to the starting position. Repeat __________ times. Complete this exercise __________ times a day. Rowing scapular retraction This is an exercise in which the shoulder blades (scapulae) are pulled toward each other (retraction). Sit in a stable chair without armrests, or stand up. Secure an exercise band to a stable object in front of you so the band is at shoulder height. Hold one end of the exercise band in each hand. Your palms should face toward each other. Bring your arms out straight in front of you. Step back, away from the secured end of the   exercise band, until the band stretches. Pull the band backward. As you do this, bend your elbows and squeeze your shoulder blades together, but avoid letting the rest of your body move. Do not shrug your shoulders upward while you do this. Stop when your elbows are at your sides or slightly behind your body. Hold for __________ seconds. Slowly straighten your arms to return to the starting position. Repeat __________ times.  Complete this exercise __________ times a day. Posture and body mechanics Good posture and healthy body mechanics can help to relieve stress in your body's tissues and joints. Body mechanics refers to the movements and positions of your body while you do your daily activities. Posture is part of body mechanics. Good posture means: Your spine is in its natural S-curve position (neutral). Your shoulders are pulled back slightly. Your head is not tipped forward. Follow these guidelines to improve your posture and body mechanics in your everyday activities. Standing  When standing, keep your spine neutral and your feet about hip width apart. Keep a slight bend in your knees. Your ears, shoulders, and hips should line up with each other. When you do a task in which you lean forward while standing in one place for a long time, place one foot up on a stable object that is 2-4 inches (5-10 cm) high, such as a footstool. This helps keep your spine neutral. Sitting  When sitting, keep your spine neutral and keep your feet flat on the floor. Use a footrest if needed. Keep your thighs parallel to the floor. Avoid rounding your shoulders, and avoid tilting your head forward. When working at a desk or a computer, keep your desk at a height where your hands are slightly lower than your elbows. Slide your chair under your desk so you are close enough to maintain good posture. When working at a computer, place your monitor at a height where you are looking straight ahead and you do not have to tilt your head forward or downward to look at the screen. Resting When lying down and resting, avoid positions that are most painful for you. If you have pain with activities such as sitting, bending, stooping, or squatting (flexion-basedactivities), lie in a position in which your body does not bend very much. For example, avoid curling up on your side with your arms and knees near your chest (fetal position). If you have  pain with activities such as standing for a long time or reaching with your arms (extension-basedactivities), lie with your spine in a neutral position and bend your knees slightly. Try the following positions: Lie on your side with a pillow between your knees. Lie on your back with a pillow under your knees.  Lifting  When lifting objects, keep your feet at least shoulder width apart and tighten your abdominal muscles. Bend your knees and hips and keep your spine neutral. It is important to lift using the strength of your legs, not your back. Do not lock your knees straight out. Always ask for help to lift heavy or awkward objects. This information is not intended to replace advice given to you by your health care provider. Make sure you discuss any questions you have with your health care provider. Document Revised: 12/18/2021 Document Reviewed: 12/18/2021 Elsevier Patient Education  2023 Elsevier Inc.  

## 2022-09-06 ENCOUNTER — Encounter: Payer: Self-pay | Admitting: Family

## 2022-09-06 ENCOUNTER — Ambulatory Visit (INDEPENDENT_AMBULATORY_CARE_PROVIDER_SITE_OTHER): Payer: 59 | Admitting: Family

## 2022-09-06 VITALS — BP 113/68 | HR 105 | Temp 97.4°F | Ht 67.0 in | Wt 205.0 lb

## 2022-09-06 DIAGNOSIS — M159 Polyosteoarthritis, unspecified: Secondary | ICD-10-CM | POA: Diagnosis not present

## 2022-09-06 DIAGNOSIS — R569 Unspecified convulsions: Secondary | ICD-10-CM | POA: Diagnosis not present

## 2022-09-06 DIAGNOSIS — F809 Developmental disorder of speech and language, unspecified: Secondary | ICD-10-CM | POA: Diagnosis not present

## 2022-09-06 DIAGNOSIS — D500 Iron deficiency anemia secondary to blood loss (chronic): Secondary | ICD-10-CM | POA: Diagnosis not present

## 2022-09-06 DIAGNOSIS — M150 Primary generalized (osteo)arthritis: Secondary | ICD-10-CM

## 2022-09-06 DIAGNOSIS — F411 Generalized anxiety disorder: Secondary | ICD-10-CM

## 2022-09-06 DIAGNOSIS — I10 Essential (primary) hypertension: Secondary | ICD-10-CM | POA: Diagnosis not present

## 2022-09-06 DIAGNOSIS — F321 Major depressive disorder, single episode, moderate: Secondary | ICD-10-CM | POA: Diagnosis not present

## 2022-09-06 DIAGNOSIS — M546 Pain in thoracic spine: Secondary | ICD-10-CM | POA: Diagnosis not present

## 2022-09-06 DIAGNOSIS — R479 Unspecified speech disturbances: Secondary | ICD-10-CM

## 2022-09-06 LAB — CMP14+EGFR
ALT: 15 IU/L (ref 0–32)
AST: 10 IU/L (ref 0–40)
Albumin/Globulin Ratio: 1.8 (ref 1.2–2.2)
Albumin: 4.4 g/dL (ref 3.8–4.9)
Alkaline Phosphatase: 102 IU/L (ref 44–121)
BUN/Creatinine Ratio: 26 — ABNORMAL HIGH (ref 9–23)
BUN: 21 mg/dL (ref 6–24)
Bilirubin Total: 0.2 mg/dL (ref 0.0–1.2)
CO2: 22 mmol/L (ref 20–29)
Calcium: 9.8 mg/dL (ref 8.7–10.2)
Chloride: 102 mmol/L (ref 96–106)
Creatinine, Ser: 0.81 mg/dL (ref 0.57–1.00)
Globulin, Total: 2.5 g/dL (ref 1.5–4.5)
Glucose: 84 mg/dL (ref 70–99)
Potassium: 4 mmol/L (ref 3.5–5.2)
Sodium: 140 mmol/L (ref 134–144)
Total Protein: 6.9 g/dL (ref 6.0–8.5)
eGFR: 87 mL/min/{1.73_m2} (ref >59)

## 2022-09-06 NOTE — Patient Instructions (Signed)
Thoracic Strain A thoracic strain is an injury to the muscles or tendons that attach to the upper part of your back behind your chest. Tendons are tissues that connect muscle to bone. This injury is sometimes called a mid-back strain. It happens when a muscle is stretched too far or overloaded. Thoracic strains can range from mild to severe. Mild strains may involve stretching a muscle or tendon without tearing it. These injuries may heal in 1-2 weeks. More severe strains involve tearing of muscle fibers or tendons. These will cause more pain and may take 6-8 weeks to heal. What are the causes? This condition may be caused by: Trauma, such as a fall or a hit to the body. Twisting or overstretching the back. This may result from doing activities that take a lot of energy, such as lifting heavy objects. In some cases, the cause may not be known. What increases the risk? This injury is more common in: Athletes. People with obesity. What are the signs or symptoms? The main symptom of this condition is pain in the middle back, especially with movement. Other symptoms include: Stiffness or limited range of motion. Sudden muscle tightening (spasms). How is this diagnosed? This condition may be diagnosed based on: Your symptoms. Your medical history. A physical exam. Imaging tests, such as X-rays, a CT scan, or an MRI. How is this treated? This condition may be treated with: Resting the injured area. Applying heat and cold to the injured area. Over-the-counter medicines for pain and inflammation, such as NSAIDs. Prescription pain medicine or muscle relaxants. These may be needed for a short time. Doing exercises to improve movement and strength in your back (physical therapy). Treatments applied to the painful area, such as: Electrical stimulation. Stimulating the muscle with small needles (dry needling). Injections of medicine (trigger point injections). Follow these instructions at  home: Managing pain, stiffness, and swelling     If told, put ice on the injured area. Put ice in a plastic bag. Place a towel between your skin and the bag. Leave the ice on for 20 minutes, 2-3 times a day. If told, apply heat to the affected area as often as told by your health care provider. Use the heat source that your provider recommends, such as a moist heat pack or a heating pad. Place a towel between your skin and the heat source. Leave the heat on for 20-30 minutes. If your skin turns bright red, remove the ice or heat right away to prevent skin damage. The risk of damage is higher if you cannot feel pain, heat, or cold. Activity Rest and return to your normal activities as told by your provider. Ask your provider what activities are safe for you. Do exercises as told by your provider. Medicines Take over-the-counter and prescription medicines only as told by your provider. Ask your provider if the medicine prescribed to you: Requires you to avoid driving or using machinery. Can cause constipation. You may need to take these actions to prevent or treat constipation: Drink enough fluid to keep your pee (urine) pale yellow. Take over-the-counter or prescription medicines. Eat foods that are high in fiber, such as beans, whole grains, and fresh fruits and vegetables. Limit foods that are high in fat and processed sugars, such as fried or sweet foods. General instructions Do not use any products that contain nicotine or tobacco. These products include cigarettes, chewing tobacco, and vaping devices, such as e-cigarettes. If you need help quitting, ask your provider. Keep all follow-up   visits. Your provider will monitor your injury and activity. How is this prevented? To prevent a future mid-back injury: Always warm up before physical activity or sports. Cool down and stretch after being active. Use correct form when playing sports and lifting heavy objects. Bend your knees  before you lift heavy objects. Use good posture when sitting and standing. Stay physically fit and maintain a healthy weight. Do at least 150 minutes of moderate-intensity exercise each week, such as brisk walking or water aerobics. Do strength exercises at least 2 times each week. Contact a health care provider if: Your pain is not helped by medicine. Your pain or stiffness is getting worse. You develop pain or stiffness in your neck or lower back. Get help right away if: You have shortness of breath. You have chest pain. You have numbness, tingling, or weakness in your legs. You are not able to control when you pee (urinary incontinence). These symptoms may be an emergency. Get help right away. Call 911. Do not wait to see if the symptoms will go away. Do not drive yourself to the hospital. This information is not intended to replace advice given to you by your health care provider. Make sure you discuss any questions you have with your health care provider. Document Revised: 12/18/2021 Document Reviewed: 12/18/2021 Elsevier Patient Education  2023 Elsevier Inc.  

## 2022-09-06 NOTE — Progress Notes (Signed)
Subjective:    Patient ID: Brittany Durham, female    DOB: [birth year], 52 y.o.   MRN: [id number]  Chief Complaint  Patient presents with   Medical Management of Chronic Issues   PT presents to the office today for chronic follow up. Pt has seen Neurologists for Speech difficulties and seizures, but has not seen them in awhile.    She is followed by Hematologists for iron deficiency and getting iron infusions.   She continues to have left thoracic back pain. Reports this pain is a 10 out 10. 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 She has taken baclofen, Celebrex, and prednisone without relief.  Hypertension This is a chronic problem. The current episode started more than 1 year ago. The problem has been resolved since onset. The problem is controlled. Associated symptoms include anxiety and malaise/fatigue. Pertinent negatives include no peripheral edema or shortness of breath. Risk factors for coronary artery disease include dyslipidemia and obesity. The current treatment provides moderate improvement. There is no history of heart failure.  Gastroesophageal Reflux She complains of belching and heartburn. This is a chronic problem. The current episode started more than 1 year ago. The problem occurs occasionally. Risk factors include obesity. She has tried a PPI for the symptoms.  Arthritis Presents for follow-up visit. She complains of pain and stiffness. Affected locations include the left knee and right knee. Her pain is at a severity of 9/10.  Anemia Presents for follow-up visit. Symptoms include malaise/fatigue. There is no history of heart failure.  Anxiety Presents for follow-up visit. Symptoms include depressed mood, excessive worry and nervous/anxious behavior. Patient reports no shortness of breath. Symptoms occur occasionally. The severity of symptoms is mild.   Her past medical history is significant for anemia.  Depression        This is a chronic problem.   The current episode started more than 1 year ago.   The problem occurs intermittently.  Associated symptoms include helplessness, hopelessness and sad.  Past treatments include SSRIs - Selective serotonin reuptake inhibitors.  Past medical history includes anxiety.       Review of Systems  Constitutional:  Positive for malaise/fatigue.  Respiratory:  Negative for shortness of breath.   Gastrointestinal:  Positive for heartburn.  Musculoskeletal:  Positive for arthritis and stiffness.  Psychiatric/Behavioral:  Positive for depression. The patient is nervous/anxious.   All other systems reviewed and are negative.      Objective:   Physical Exam Vitals reviewed.  Constitutional:      General: She is not in acute distress.    Appearance: She is well-developed.  HENT:     Head: Normocephalic and atraumatic.     Right Ear: Tympanic membrane normal.     Left Ear: Tympanic membrane normal.  Eyes:     Pupils: Pupils are equal, round, and reactive to light.  Neck:     Thyroid: No thyromegaly.  Cardiovascular:     Rate and Rhythm: Normal rate and regular rhythm.     Heart sounds: Normal heart sounds. No murmur heard. Pulmonary:     Effort: Pulmonary effort is normal. No respiratory distress.     Breath sounds: Normal breath sounds. No wheezing.  Abdominal:     General: Bowel sounds are normal. There is no distension.     Palpations: Abdomen is soft.     Tenderness: There is no abdominal tenderness.  Musculoskeletal:        General: No tenderness. Normal range  of motion.     Cervical back: Normal range of motion and neck supple.  Skin:    General: Skin is warm and dry.  Neurological:     Mental Status: She is alert and oriented to person, place, and time.     Cranial Nerves: No cranial nerve deficit.     Deep Tendon Reflexes: Reflexes are normal and symmetric.  Psychiatric:        Behavior: Behavior normal.        Thought Content: Thought content normal.        Judgment:  Judgment normal.       BP 113/68   Pulse (!) 105   Temp (!) 97.4 F (36.3 C) (Temporal)   Ht  (1.702 m)   Wt 205 lb (93 kg)   LMP 09/16/2017   SpO2 99%   BMI 32.11 kg/m      Assessment & Plan:  Brittany Durham comes in today with chief complaint of Medical Management of Chronic Issues   Diagnosis and orders addressed:  1. Depression, major, single episode, moderate - CMP14+EGFR  2. GAD (generalized anxiety disorder) - CMP14+EGFR  3. HYPERTENSION, BENIGN - CMP14+EGFR  4. Iron deficiency anemia due to chronic blood loss - CMP14+EGFR  5. Language difficulty - CMP14+EGFR  6. Primary osteoarthritis involving multiple joints  - CMP14+EGFR  7. Acute left-sided thoracic back pain Continue baclofen and Celebrex  Referral pending to PT and  ORtho - Ambulatory referral to Physical Therapy - Ambulatory referral to Orthopedic Surgery - CMP14+EGFR  8. Seizures  9. Speech disorder   Labs pending Continue current medications  Health Maintenance reviewed Diet and exercise encouraged  Follow up plan: 3 months    Jannifer Rodney, FNP

## 2022-09-07 ENCOUNTER — Inpatient Hospital Stay: Payer: 59

## 2022-09-07 VITALS — BP 122/68 | HR 99 | Temp 98.7°F | Resp 18

## 2022-09-07 DIAGNOSIS — D500 Iron deficiency anemia secondary to blood loss (chronic): Secondary | ICD-10-CM

## 2022-09-07 DIAGNOSIS — I351 Nonrheumatic aortic (valve) insufficiency: Secondary | ICD-10-CM | POA: Diagnosis not present

## 2022-09-07 DIAGNOSIS — D62 Acute posthemorrhagic anemia: Secondary | ICD-10-CM

## 2022-09-07 DIAGNOSIS — D509 Iron deficiency anemia, unspecified: Secondary | ICD-10-CM | POA: Diagnosis not present

## 2022-09-07 DIAGNOSIS — I10 Essential (primary) hypertension: Secondary | ICD-10-CM | POA: Diagnosis not present

## 2022-09-07 DIAGNOSIS — R55 Syncope and collapse: Secondary | ICD-10-CM | POA: Diagnosis not present

## 2022-09-07 MED ORDER — METHYLPREDNISOLONE SODIUM SUCC 125 MG IJ SOLR
125.0000 mg | Freq: Once | INTRAMUSCULAR | Status: AC
  Administered 2022-09-07: 125 mg via INTRAVENOUS
  Filled 2022-09-07: qty 2

## 2022-09-07 MED ORDER — SODIUM CHLORIDE 0.9 % IV SOLN
510.0000 mg | Freq: Once | INTRAVENOUS | Status: AC
  Administered 2022-09-07: 510 mg via INTRAVENOUS
  Filled 2022-09-07: qty 510

## 2022-09-07 MED ORDER — CETIRIZINE HCL 10 MG PO TABS
10.0000 mg | ORAL_TABLET | Freq: Once | ORAL | Status: AC
  Administered 2022-09-07: 10 mg via ORAL
  Filled 2022-09-07: qty 1

## 2022-09-07 MED ORDER — FAMOTIDINE IN NACL 20-0.9 MG/50ML-% IV SOLN
20.0000 mg | Freq: Once | INTRAVENOUS | Status: AC
  Administered 2022-09-07: 20 mg via INTRAVENOUS
  Filled 2022-09-07: qty 50

## 2022-09-07 MED ORDER — SODIUM CHLORIDE 0.9 % IV SOLN: Freq: Once | INTRAVENOUS | Status: AC

## 2022-09-07 NOTE — Patient Instructions (Signed)
[facility]  Discharge Instructions: Thank you for choosing [facility] to provide your oncology and hematology care.  If you have a lab appointment with the Cancer Center - please note that after April 8th, 2024, all labs will be drawn in the cancer center.  You do not have to check in or register with the main entrance as you have in the past but will complete your check-in in the cancer center.  Wear comfortable clothing and clothing appropriate for easy access to any Portacath or PICC line.   We strive to give you quality time with your provider. You may need to reschedule your appointment if you arrive late (15 or more minutes).  Arriving late affects you and other patients whose appointments are after yours.  Also, if you miss three or more appointments without notifying the office, you may be dismissed from the clinic at the provider's discretion.      For prescription refill requests, have your pharmacy contact our office and allow 72 hours for refills to be completed.    Today you received the following chemotherapy and/or immunotherapy agents ferahemeFerumoxytol Injection What is this medication? FERUMOXYTOL (FER ue MOX i tol) treats low levels of iron in your body (iron deficiency anemia). Iron is a mineral that plays an important role in making red blood cells, which carry oxygen from your lungs to the rest of your body. This medicine may be used for other purposes; ask your health care provider or pharmacist if you have questions. COMMON BRAND NAME(S): Feraheme What should I tell my care team before I take this medication? They need to know if you have any of these conditions: Anemia not caused by low iron levels High levels of iron in the blood Magnetic resonance imaging (MRI) test scheduled An unusual or allergic reaction to iron, other medications, foods, dyes, or preservatives Pregnant or trying to get pregnant Breastfeeding How should I use  this medication? This medication is injected into a vein. It is given by your care team in a hospital or clinic setting. Talk to your care team the use of this medication in children. Special care may be needed. Overdosage: If you think you have taken too much of this medicine contact a poison control center or emergency room at once. NOTE: This medicine is only for you. Do not share this medicine with others. What if I miss a dose? It is important not to miss your dose. Call your care team if you are unable to keep an appointment. What may interact with this medication? Other iron products This list may not describe all possible interactions. Give your health care provider a list of all the medicines, herbs, non-prescription drugs, or dietary supplements you use. Also tell them if you smoke, drink alcohol, or use illegal drugs. Some items may interact with your medicine. What should I watch for while using this medication? Visit your care team regularly. Tell your care team if your symptoms do not start to get better or if they get worse. You may need blood work done while you are taking this medication. You may need to follow a special diet. Talk to your care team. Foods that contain iron include: whole grains/cereals, dried fruits, beans, or peas, leafy green vegetables, and organ meats (liver, kidney). What side effects may I notice from receiving this medication? Side effects that you should report to your care team as soon as possible: Allergic reactions--skin rash, itching, hives, swelling of   the face, lips, tongue, or throat Low blood pressure--dizziness, feeling faint or lightheaded, blurry vision Shortness of breath Side effects that usually do not require medical attention (report to your care team if they continue or are bothersome): Flushing Headache Joint pain Muscle pain Nausea Pain, redness, or irritation at injection site This list may not describe all possible side effects.  Call your doctor for medical advice about side effects. You may report side effects to FDA at 1-800-FDA-1088. Where should I keep my medication? This medication is given in a hospital or clinic and will not be stored at home. NOTE: This sheet is a summary. It may not cover all possible information. If you have questions about this medicine, talk to your doctor, pharmacist, or health care provider.  2023 Elsevier/Gold Standard (2020-09-21 00:00:00)       To help prevent nausea and vomiting after your treatment, we encourage you to take your nausea medication as directed.  BELOW ARE SYMPTOMS THAT SHOULD BE REPORTED IMMEDIATELY: *FEVER GREATER THAN 100.4 F (38 C) OR HIGHER *CHILLS OR SWEATING *NAUSEA AND VOMITING THAT IS NOT CONTROLLED WITH YOUR NAUSEA MEDICATION *UNUSUAL SHORTNESS OF BREATH *UNUSUAL BRUISING OR BLEEDING *URINARY PROBLEMS (pain or burning when urinating, or frequent urination) *BOWEL PROBLEMS (unusual diarrhea, constipation, pain near the anus) TENDERNESS IN MOUTH AND THROAT WITH OR WITHOUT PRESENCE OF ULCERS (sore throat, sores in mouth, or a toothache) UNUSUAL RASH, SWELLING OR PAIN  UNUSUAL VAGINAL DISCHARGE OR ITCHING   Items with * indicate a potential emergency and should be followed up as soon as possible or go to the Emergency Department if any problems should occur.  Please show the CHEMOTHERAPY ALERT CARD or IMMUNOTHERAPY ALERT CARD at check-in to the Emergency Department and triage nurse.  Should you have questions after your visit or need to cancel or reschedule your appointment, please contact [facility] [phone]  and follow the prompts.  Office hours are 8:00 a.m. to 4:30 p.m. Monday - Friday. Please note that voicemails left after 4:00 p.m. may not be returned until the following business day.  We are closed weekends and major holidays. You have access to a nurse at all times for urgent questions. Please call the main number to the  clinic [phone] and follow the prompts.  For any non-urgent questions, you may also contact your provider using MyChart. We now offer e-Visits for anyone 18 and older to request care online for non-urgent symptoms. For details visit [email or web address].   Also download the MyChart app! Go to the app store, search "MyChart", open the app, select [facility], and log in with your MyChart username and password.   

## 2022-09-07 NOTE — Progress Notes (Signed)
Patient presents today for Feraheme infusion. Vital signs stable. Patient denies any side effects related to her last iron infusion. MAR updated.   Feraheme given today per MD orders. Tolerated infusion without adverse affects. Vital signs stable. No complaints at this time. Discharged from clinic ambulatory in stable condition. Alert and oriented x 3. F/U with [facility] as scheduled.

## 2022-09-13 ENCOUNTER — Encounter (HOSPITAL_BASED_OUTPATIENT_CLINIC_OR_DEPARTMENT_OTHER): Payer: Self-pay | Admitting: Cardiology

## 2022-09-13 ENCOUNTER — Ambulatory Visit (INDEPENDENT_AMBULATORY_CARE_PROVIDER_SITE_OTHER): Payer: 59 | Admitting: Cardiology

## 2022-09-13 VITALS — BP 125/78 | HR 88 | Ht 67.0 in | Wt 206.0 lb

## 2022-09-13 DIAGNOSIS — R072 Precordial pain: Secondary | ICD-10-CM

## 2022-09-13 DIAGNOSIS — I10 Essential (primary) hypertension: Secondary | ICD-10-CM | POA: Diagnosis not present

## 2022-09-13 DIAGNOSIS — R55 Syncope and collapse: Secondary | ICD-10-CM | POA: Diagnosis not present

## 2022-09-13 DIAGNOSIS — R002 Palpitations: Secondary | ICD-10-CM

## 2022-09-13 NOTE — Patient Instructions (Signed)
Medication Instructions:  Continue current medication  *If you need a refill on your cardiac medications before your next appointment, please call your pharmacy*   Lab Work: None Ordered   Testing/Procedures: None Ordered   Follow-Up: At [facility], you and your health needs are our priority.  As part of our continuing mission to provide you with exceptional heart care, we have created designated Provider Care Teams.  These Care Teams include your primary Cardiologist (physician) and Advanced Practice Providers (APPs -  Physician Assistants and Nurse Practitioners) who all work together to provide you with the care you need, when you need it.  We recommend signing up for the patient portal called "MyChart".  Sign up information is provided on this After Visit Summary.  MyChart is used to connect with patients for Virtual Visits (Telemedicine).  Patients are able to view lab/test results, encounter notes, upcoming appointments, etc.  Non-urgent messages can be sent to your provider as well.   To learn more about what you can do with MyChart, go to [email or web address].    Your next appointment:   1 year(s)  Provider:   Jodelle Red, MD    Other Instructions If you pass out again, please give our office a call

## 2022-09-13 NOTE — Progress Notes (Signed)
Cardiology Office Note:    Date:  09/13/2022   ID:  [name], DOB [birth year], MRN [id number]  PCP:  [name], FNP  Cardiologist:  Jodelle Red, MD PhD  Referring MD: [name], FNP   CC: follow up  History of Present Illness:    Brittany Durham is a 52 y.o. female with a hx of atypical chest pain, hypertension, speech disorder, who is seen for follow up. I initially saw her 01/29/19 as a new consult at the request of [name], FNP for the evaluation and management of chest pain.  Cardiac history:  Ms. Stokley has an undefined speech issue at baseline. Has been seen in [facility] 01/06/19 for chest pain. Had gone frequently in the past but had not been in some time. That event was the result of gradually increasing central, sharp chest pain, worse with deep breathing, that became so severe that she sought emergency medical attention. At that visit, HsTn 6-> 7, not consistent with ACS. D-dimer normal. CXR without acute changes. ECG was sinus tach. She improved with toradal, ativan, and fluids.  Chest pain history: -Initial onset: ~10 years ago -Quality: sharp, central, nonradiating, sometimes pleuritic -Frequency: had two years between event in August and prior event. -Associated symptoms: short of breath, nauseated, dizzy. Distant syncope but none recently (2-3 years ago) -Aggravating/alleviating factors: better when she sleeps, better with sucralfate but then comes back -Prior cardiac history: no MI. 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 Has never seen neurologist.  She underwent laparoscopic cholecystectomy on 02/23/2021.  At her last visit she complained of intermittent nocturnal episodes of chest pain and palpitations waking her up. Was reassuring that pressing on the chest changed her symptoms. She is not good with technology and didn't wish to try [facility]. She had multiple  falls attributable to weakness in her bilateral knees. Her blood pressure was at goal.  Today, she reports having 2 syncopal episodes approximately within one month of each other in April. They have been associated with racing heart beats, shortness of breath, nausea, and diaphoresis. The first episode occurred when she was at her daughter's house and playing with her grandson. Her chest started hurting, and then she states that "everything went blank" and she fell over. Per her daughter she was unconscious for 2-3 minutes. When she regained consciousness she was feeling relatively okay. Her second episode occurred when she was walking in the setting of hot weather. She began feeling nauseous and vomited. The next thing she knew she hit the ground. Her daughter then picked her up quickly. She was noted to be dehydrated.  She is still having sharp chest pains that feel the same as before, more often at night. Usually she will lie down when this occurs. Not sure how long the pain lasts, although her chest pain is ongoing by the time she wakes up. She has tried taking tylenol which offers some relief but doesn't resolve the pain.  Also, she continues to have random episodes of nocturnal palpitations and racing heart beats. This usually wakes her up at night. They are not occurring every night.  She denies any peripheral edema, headaches, orthopnea, or PND.   Past Medical History:  Diagnosis Date   Anemia    Asthma    childhood   Carotidynia    COPD (chronic obstructive pulmonary disease) (HCC)    DDD (degenerative disc disease), cervical  GERD (gastroesophageal reflux disease)    Hypertension    Knee pain    left meniscus tear s/p repair 11/2014   Seizures (HCC)    last seizure was over 10 years ago, unknown etiology   Stuttering    patient reports this started after her knee surgery in 11/2014.    Thyroid nodule     Past Surgical History:  Procedure Laterality Date   ANTERIOR CERVICAL  DECOMP/DISCECTOMY FUSION N/A 03/20/2019   Procedure: Anterior Cervical Decompression Fusion - Cervical Four-Cervical Five - Cervical Five-Cervical Six;  Surgeon: Tia Alert, MD;  Location: [facility] OR;  Service: Neurosurgery;  Laterality: N/A;  Anterior Cervical Decompression Fusion - Cervical Four-Cervical Five - Cervical Five-Cervical Six   BIOPSY  09/20/2020   Procedure: BIOPSY;  Surgeon: Corbin Ade, MD;  Location: [facility] SUITE;  Service: Endoscopy;;   COLONOSCOPY N/A 08/23/2016   Procedure: COLONOSCOPY;  Surgeon: Corbin Ade, MD;  Location: [facility];  Service: Endoscopy;  Laterality: N/A;  1:00pm   COLONOSCOPY N/A 08/24/2020   Diverticulosis in the entire examined colon. Redundant colon. Exam otherwise normal   ESOPHAGOGASTRODUODENOSCOPY N/A 05/03/2016   Procedure: ESOPHAGOGASTRODUODENOSCOPY (EGD);  Surgeon: Corbin Ade, MD;  Location: [facility];  Service: Endoscopy;  Laterality: N/A;   ESOPHAGOGASTRODUODENOSCOPY N/A 08/23/2016   Procedure: ESOPHAGOGASTRODUODENOSCOPY (EGD);  Surgeon: Corbin Ade, MD;  Location: [facility];  Service: Endoscopy;  Laterality: N/A;   ESOPHAGOGASTRODUODENOSCOPY (EGD) WITH PROPOFOL N/A 09/20/2020   normal esophagus. Large hiatal hernia. Multiple gastric erosions (Cameron lesions). Normal duodenal bulb and second portion of the duodenum. Suspicion Sheria Lang lesions causing intermittent bleeding   KNEE ARTHROSCOPY Right    KNEE ARTHROSCOPY WITH MEDIAL MENISECTOMY Left 12/09/2014   Procedure: KNEE ARTHROSCOPY WITH PARTIAL MEDIAL AND LATERAL MENISECTOMY;  Surgeon: Darreld Mclean, MD;  Location: [facility] ORS;  Service: Orthopedics;  Laterality: Left;   TUBAL LIGATION     WISDOM TOOTH EXTRACTION      Current Medications: Current Outpatient Medications on File Prior to Visit  Medication Sig   amLODipine (NORVASC) 10 MG tablet Take 1 tablet (10 mg total) by mouth daily. (NEEDS TO BE SEEN BEFORE NEXT REFILL)   baclofen (LIORESAL) 10 MG tablet Take 1  tablet (10 mg total) by mouth 3 (three) times daily.   buPROPion (WELLBUTRIN XL) 150 MG 24 hr tablet TAKE 1 TABLET DAILY   busPIRone (BUSPAR) 5 MG tablet TAKE ONE TABLET BY MOUTH UP TO THREE TIMES DAILY.   celecoxib (CELEBREX) 200 MG capsule Take 1 capsule (200 mg total) by mouth 2 (two) times daily.   escitalopram (LEXAPRO) 20 MG tablet Take 1 tablet (20 mg total) by mouth daily.   lisinopril (ZESTRIL) 20 MG tablet Take 1 tablet (20 mg total) by mouth daily. (NEEDS TO BE SEEN BEFORE NEXT REFILL)   pantoprazole (PROTONIX) 40 MG tablet TAKE 1 TABLET ONCE DAILY BEFORE BREAKFAST   predniSONE (DELTASONE) 10 MG tablet Take by mouth.   predniSONE (STERAPRED UNI-PAK 21 TAB) 10 MG (21) TBPK tablet Use as directed   No current facility-administered medications on file prior to visit.     Allergies:   Acetaminophen and Venofer [iron sucrose]   Social History   Tobacco Use   Smoking status: Never   Smokeless tobacco: Never  Vaping Use   Vaping Use: Never used  Substance Use Topics   Alcohol use: No   Drug use: No    Family History: The patient's family history includes Hypertension in her brother  and mother; Lung cancer in her father. There is no history of Colon cancer, Gastric cancer, or Esophageal cancer.  ROS:   Please see the history of present illness.   (+) Syncope (+) Racing palpitations (+) Sharp chest pains Additional pertinent ROS otherwise unremarkable.  EKGs/Labs/Other Studies Reviewed:    The following studies were reviewed today:  Lexiscan 03/19/19 Nuclear stress EF: 51%. Visually, the EF appears to be greater than the computer calculated 51% and appears normal There was no ST segment deviation noted during stress. This is a low risk study. There is no evidence of ischemia or previous infarction The study is normal  Echo 09/2016: Study Conclusions   - Left ventricle: The cavity size was normal. Wall thickness was    normal. Systolic function was normal. The  estimated ejection    fraction was in the range of 60% to 65%. Wall motion was normal;    there were no regional wall motion abnormalities. Doppler    parameters are consistent with abnormal left ventricular    relaxation (grade 1 diastolic dysfunction).  - Aortic valve: Valve area (VTI): 1.62 cm^2. Valve area (Vmax):    1.62 cm^2.  - Technically adequate study.   Echo 12/2014:  Study Conclusions   - Left ventricle: The cavity size was normal. Wall thickness was    increased in a pattern of mild LVH. Systolic function was normal.    The estimated ejection fraction was in the range of 60% to 65%.    Wall motion was normal; there were no regional wall motion    abnormalities.  - Aortic valve: Mildly calcified annulus.  - Mitral valve: Mildly calcified annulus.  - Atrial septum: Mildly aneurysmal. There was a patent foramen    ovale.    EKG:  EKG is personally reviewed.   09/13/2022:  NSR at 88 bpm 08/07/2021: NSR at 92 bpm 04/27/2020: NSR at 98 bpm  Recent Labs: 08/28/2022: Hemoglobin 10.3; Platelets 276 09/06/2022: ALT 15; BUN 21; Creatinine, Ser 0.81; Potassium 4.0; Sodium 140   Recent Lipid Panel    Component Value Date/Time   CHOL 196 01/29/2019 1039   TRIG 93 01/29/2019 1039   HDL 58 01/29/2019 1039   CHOLHDL 3.4 01/29/2019 1039   CHOLHDL 2.9 12/13/2014 0613   VLDL 10 12/13/2014 0613   LDLCALC 121 (H) 01/29/2019 1039    Physical Exam:    VS:  BP 125/78 (BP Location: Left Arm, Patient Position: Sitting, Cuff Size: Large)   Pulse 88   Ht 5\' 7"  (1.702 m)   Wt 206 lb (93.4 kg)   LMP 09/16/2017   SpO2 97%   BMI 32.26 kg/m     Orthostatic VS for the past 24 hrs (Last 3 readings):  BP- Lying Pulse- Lying BP- Sitting Pulse- Sitting BP- Standing at 0 minutes Pulse- Standing at 0 minutes BP- Standing at 3 minutes Pulse- Standing at 3 minutes  09/13/22 1123 108/73 86 114/75 97 109/74 103 112/75 95     Wt Readings from Last 3 Encounters:  09/13/22 206 lb (93.4 kg)   09/06/22 205 lb (93 kg)  08/31/22 201 lb (91.2 kg)    GEN: Well nourished, well developed in no acute distress HEENT: Normal, moist mucous membranes NECK: No JVD CARDIAC: regular rhythm, normal S1 and S2, no rubs or gallops. No murmur. VASCULAR: Radial and DP pulses 2+ bilaterally. No carotid bruits RESPIRATORY:  Clear to auscultation without rales, wheezing or rhonchi  ABDOMEN: Soft, non-tender, non-distended MUSCULOSKELETAL:  Ambulates independently  SKIN: Warm and dry, no edema NEUROLOGIC:  Alert and oriented x 3. No focal neuro deficits noted beyond chronic speech changes. PSYCHIATRIC:  Normal affect   ASSESSMENT:    1. Syncope and collapse   2. Precordial pain   3. Heart palpitations   4. Essential hypertension     PLAN:    Syncope -not orthostatic -ECG unremarkable -has had recent normal CV workup -she will contact me if this occurs again, would then pursue monitor  Palpitations -only at night, difficult to predict frequency but unlikely to occur within a fixed 2 week period. We discussed that this would make a monitor low yield at this time. She is not good with technology and does not wish to try [facility] -she will contact me if symptoms become more frequent or longer lasting and we will reassess -reviewed red flag warning signs that need immediate medical attention -ECG reassuring today  Precordial pain:  -stress test reassuring. Also reassuring that pressing on the chest changes her symptoms -many other etiologies, including MSK, neurologic, pleuritic, GI, etc. Have improved with toradol, carafate, and nitroglycerin. Instructed on red flag signs that need immediate medical attention.   Hypertension; at goal today.  -continue amlodipine, lisinopril  Cardiac risk counseling and prevention recommendations: -recommend heart healthy/Mediterranean diet, with whole grains, fruits, vegetable, fish, lean meats, nuts, and olive oil. Limit salt. -recommend moderate  walking, 3-5 times/week for 30-50 minutes each session. Aim for at least 150 minutes.week. Goal should be pace of 3 miles/hours, or walking 1.5 miles in 30 minutes -recommend avoidance of tobacco products. Avoid excess alcohol. -ASCVD risk score: The ASCVD Risk score (Arnett DK, et al., 2019) failed to calculate for the following reasons:   Cannot find a previous HDL lab   Cannot find a previous total cholesterol lab    Plan for follow up: 12 months or sooner as needed.  Medication Adjustments/Labs and Tests Ordered: Current medicines are reviewed at length with the patient today.  Concerns regarding medicines are outlined above.   Orders Placed This Encounter  Procedures   EKG 12-Lead   No orders of the defined types were placed in this encounter.  Patient Instructions  Medication Instructions:  Continue current medication  *If you need a refill on your cardiac medications before your next appointment, please call your pharmacy*   Lab Work: None Ordered   Testing/Procedures: None Ordered   Follow-Up: At [facility], you and your health needs are our priority.  As part of our continuing mission to provide you with exceptional heart care, we have created designated Provider Care Teams.  These Care Teams include your primary Cardiologist (physician) and Advanced Practice Providers (APPs -  Physician Assistants and Nurse Practitioners) who all work together to provide you with the care you need, when you need it.  We recommend signing up for the patient portal called "MyChart".  Sign up information is provided on this After Visit Summary.  MyChart is used to connect with patients for Virtual Visits (Telemedicine).  Patients are able to view lab/test results, encounter notes, upcoming appointments, etc.  Non-urgent messages can be sent to your provider as well.   To learn more about what you can do with MyChart, go to [email or web address].    Your next appointment:    1 year(s)  Provider:   Jodelle Red, MD    Other Instructions If you pass out again, please give our office a call    I,Mathew Stumpf,acting as a scribe for [employer]  Cristal Deer, MD.,have documented all relevant documentation on the behalf of Jodelle Red, MD,as directed by  Jodelle Red, MD while in the presence of Jodelle Red, MD.  I, Jodelle Red, MD, have reviewed all documentation for this visit. The documentation on 09/13/22 for the exam, diagnosis, procedures, and orders are all accurate and complete.   Signed, Jodelle Red, MD PhD 09/13/2022   [facility]

## 2022-09-14 ENCOUNTER — Inpatient Hospital Stay: Payer: 59

## 2022-09-19 ENCOUNTER — Other Ambulatory Visit: Payer: Self-pay

## 2022-09-19 ENCOUNTER — Ambulatory Visit: Payer: 59 | Attending: Family

## 2022-09-19 DIAGNOSIS — M546 Pain in thoracic spine: Secondary | ICD-10-CM | POA: Insufficient documentation

## 2022-09-19 DIAGNOSIS — M6281 Muscle weakness (generalized): Secondary | ICD-10-CM | POA: Diagnosis not present

## 2022-09-19 DIAGNOSIS — Z9181 History of falling: Secondary | ICD-10-CM | POA: Diagnosis not present

## 2022-09-19 DIAGNOSIS — M5459 Other low back pain: Secondary | ICD-10-CM | POA: Diagnosis not present

## 2022-09-19 NOTE — Therapy (Signed)
OUTPATIENT PHYSICAL THERAPY THORACOLUMBAR EVALUATION   Patient Name: Brittany Durham MRN: [id number] DOB:[birth year], 52 y.o., female Today's Date: 09/19/2022  END OF SESSION:  PT End of Session - 09/19/22 1037     Visit Number 1    Number of Visits 8    Date for PT Re-Evaluation 11/09/22    PT Start Time 1040    PT Stop Time 1119    PT Time Calculation (min) 39 min    Activity Tolerance Patient limited by pain    Behavior During Therapy [facility] for tasks assessed/performed             Past Medical History:  Diagnosis Date   Anemia    Asthma    childhood   Carotidynia    COPD (chronic obstructive pulmonary disease) (HCC)    DDD (degenerative disc disease), cervical    GERD (gastroesophageal reflux disease)    Hypertension    Knee pain    left meniscus tear s/p repair 11/2014   Seizures (HCC)    last seizure was over 10 years ago, unknown etiology   Stuttering    patient reports this started after her knee surgery in 11/2014.    Thyroid nodule    Past Surgical History:  Procedure Laterality Date   ANTERIOR CERVICAL DECOMP/DISCECTOMY FUSION N/A 03/20/2019   Procedure: Anterior Cervical Decompression Fusion - Cervical Four-Cervical Five - Cervical Five-Cervical Six;  Surgeon: Tia Alert, MD;  Location: [facility] OR;  Service: Neurosurgery;  Laterality: N/A;  Anterior Cervical Decompression Fusion - Cervical Four-Cervical Five - Cervical Five-Cervical Six   BIOPSY  09/20/2020   Procedure: BIOPSY;  Surgeon: Corbin Ade, MD;  [facility];  Service: Endoscopy;;   COLONOSCOPY N/A 08/23/2016   Procedure: COLONOSCOPY;  Surgeon: Corbin Ade, MD;  [facility];  Service: Endoscopy;  Laterality: N/A;  1:00pm   COLONOSCOPY N/A 08/24/2020   Diverticulosis in the entire examined colon. Redundant colon. Exam otherwise normal   ESOPHAGOGASTRODUODENOSCOPY N/A 05/03/2016   Procedure: ESOPHAGOGASTRODUODENOSCOPY (EGD);  Surgeon: Corbin Ade, MD;  [facility];  Service: Endoscopy;  Laterality: N/A;   ESOPHAGOGASTRODUODENOSCOPY N/A 08/23/2016   Procedure: ESOPHAGOGASTRODUODENOSCOPY (EGD);  Surgeon: Corbin Ade, MD;  [facility];  Service: Endoscopy;  Laterality: N/A;   ESOPHAGOGASTRODUODENOSCOPY (EGD) WITH PROPOFOL N/A 09/20/2020   normal esophagus. Large hiatal hernia. Multiple gastric erosions (Cameron lesions). Normal duodenal bulb and second portion of the duodenum. Suspicion Sheria Lang lesions causing intermittent bleeding   KNEE ARTHROSCOPY Right    KNEE ARTHROSCOPY WITH MEDIAL MENISECTOMY Left 12/09/2014   Procedure: KNEE ARTHROSCOPY WITH PARTIAL MEDIAL AND LATERAL MENISECTOMY;  Surgeon: Darreld Mclean, MD;  Location: [facility];  Service: Orthopedics;  Laterality: Left;   TUBAL LIGATION     WISDOM TOOTH EXTRACTION     Patient Active Problem List   Diagnosis Date Noted   Biliary dyskinesia 02/01/2021   Right flank pain 10/27/2020   Iron deficiency anemia due to chronic blood loss 09/15/2020   Symptomatic anemia 07/20/2020   Depression, major, single episode, moderate (HCC) 07/15/2020   Vertigo 01/26/2020   GAD (generalized anxiety disorder) 07/28/2019   Radiculopathy, cervical region 07/16/2019   S/P cervical spinal fusion 03/20/2019   Osteoarthritis 11/18/2017   Insomnia 11/18/2017   Synovitis, villonodular, knee, right 10/28/2017   Tear of lateral meniscus of right knee, current 10/28/2017   Chondromalacia patellae, 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   Language difficulty 09/25/2016   Nausea with vomiting 07/18/2016   PUD (  peptic ulcer disease) 07/18/2016   Slurred speech    GI bleed 05/02/2016   Sinus tachycardia 05/02/2016   Acute blood loss anemia 05/02/2016   Speech disorder 03/31/2015   Thyroid nodule 12/14/2014   Left knee pain 12/14/2014   Seizures (HCC)    Vitamin D deficiency 06/30/2014   Gastroesophageal reflux disease without esophagitis 06/29/2014   SYNCOPE 03/02/2010   HYPERTENSION, BENIGN 03/01/2010    REFERRING PROVIDER: [name], FNP   REFERRING DIAG: Acute left-sided thoracic back pain   Rationale for Evaluation and Treatment: Rehabilitation  THERAPY DIAG:  Other low back pain  ONSET DATE: April 2024   SUBJECTIVE:                                                                                                                                                                                           SUBJECTIVE STATEMENT: Patient reports that her back has been bothering for about a month. She notes that she was at her daughter's house when she woke up and her back was really hurting. She is not able to do her normal activities because of her back pain. She notes that her pain is primarily on her left side, but her right side has begun to hurt recently. She is not able to sleep much at night due to the pain as it wakes her up about every 4 hours. She has experienced back pain before, but nothing like this.   PERTINENT HISTORY:  HTN, OA, history of seizures, anxiety, depression, asthma, and COPD  PAIN:  Are you having pain? Yes: NPRS scale: 9-10/10 Pain location: left low back Pain description: constant sharp pain  Aggravating factors: none known Relieving factors: taking a hot shower   PRECAUTIONS: None  WEIGHT BEARING RESTRICTIONS: No  FALLS:  Has patient fallen in last 6 months? No  LIVING ENVIRONMENT: Lives with: lives alone Lives in: House/apartment Stairs: Yes: External: 2 steps; none; step to pattern Has following equipment at home: Walker - 2 wheeled  OCCUPATION: not working  PLOF: Independent  PATIENT GOALS: reduced pain, be abel to walk longer, and be able to sleep better  NEXT MD VISIT: 09/20/22 (Dr. Ophelia Charter)   OBJECTIVE:   DIAGNOSTIC FINDINGS: 08/31/22 Thoracic x-ray IMPRESSION: Negative.  SCREENING FOR RED FLAGS: Bowel or bladder incontinence: No Spinal tumors: No Cauda equina syndrome: No Compression fracture: No Abdominal aneurysm:  No  COGNITION: Overall cognitive status: Within functional limits for tasks assessed     SENSATION: Patient reports no numbness or tingling  POSTURE: forward head  PALPATION: Familiar pain reproduced with palpation to: left lumbar paraspinals, QL, and latissimus dorsi  LUMBAR ROM:   AROM eval  Flexion 24; familiar pain  Extension 8; familiar pain  Right lateral flexion   Left lateral flexion   Right rotation 25% limited  Left rotation 25% limited; painful   (Blank rows = not tested)  LOWER EXTREMITY ROM: WFL for activities assessed  LOWER EXTREMITY MMT:    MMT Right eval Left eval  Hip flexion 4+/5; low back pain  4+/5  Hip extension    Hip abduction    Hip adduction    Hip internal rotation    Hip external rotation    Knee flexion 4+/5 4+//5  Knee extension 4+/5 5/5  Ankle dorsiflexion 4/5 4/5  Ankle plantarflexion    Ankle inversion    Ankle eversion     (Blank rows = not tested)  LUMBAR SPECIAL TESTS:  Unable to be assessed due to pain severity and irritability  GAIT: Assistive device utilized: None Level of assistance: Complete Independence Comments: decreased gait speed and stride length  TODAY'S TREATMENT:                                                                                                                              DATE:   Modalities  Date:  Hot Pack: Lumbar, 10 mins, Pain and Tone  PATIENT EDUCATION:  Education details: POC, healing, prognosis, anatomy, and goals for therapy Person educated: Patient Education method: Explanation Education comprehension: verbalized understanding  HOME EXERCISE PROGRAM:   ASSESSMENT:  CLINICAL IMPRESSION: Patient is a 52 y.o. female who was seen today for physical therapy evaluation and treatment for acute left sided low back pain. She presented with high pain severity and irritability with lumbar active range of motion and palpation to her left lumbar musculature being the most aggravating to  her familiar symptoms. Recommend that she continue with skilled physical therapy to address her impairments to return to her prior level of function.    OBJECTIVE IMPAIRMENTS: Abnormal gait, decreased activity tolerance, decreased mobility, difficulty walking, decreased ROM, decreased strength, hypomobility, impaired tone, postural dysfunction, and pain.   ACTIVITY LIMITATIONS: carrying, lifting, bending, sitting, standing, sleeping, stairs, transfers, bed mobility, locomotion level, and caring for others  PARTICIPATION LIMITATIONS: meal prep, cleaning, laundry, shopping, and community activity  PERSONAL FACTORS: Time since onset of injury/illness/exacerbation and 3+ comorbidities: HTN, OA, history of seizures, anxiety, depression, asthma, and COPD  are also affecting patient's functional outcome.   REHAB POTENTIAL: Fair    CLINICAL DECISION MAKING: Unstable/unpredictable  EVALUATION COMPLEXITY: High   GOALS: Goals reviewed with patient? Yes  LONG TERM GOALS: Target date: 10/17/22  Patient will be independent with her HEP.  Baseline:  Goal status: INITIAL  2.  Patient will be able to complete her daily activities without her familiar pain exceeding 8/10.  Baseline:  Goal status: INITIAL  3.  Patient will be able to demonstrate at least 30 degrees of lumbar flexion for improved function picking items up from the floor.  Baseline:  Goal status: INITIAL  4.  Patient will report being able to play with her grandchildren without being limited by her familiar low back pain.  Baseline:  Goal status: INITIAL  5.  Patient will report being able to sleep at least 6 hours without being awakened by her familiar low back pain.  Baseline:  Goal status: INITIAL  PLAN:  PT FREQUENCY: 2x/week  PT DURATION: 4 weeks  PLANNED INTERVENTIONS: Therapeutic exercises, Therapeutic activity, Neuromuscular re-education, Gait training, Patient/Family education, Self Care, Joint mobilization, Stair  training, Dry Needling, Electrical stimulation, Spinal mobilization, Cryotherapy, Moist heat, Ultrasound, Manual therapy, and Re-evaluation.  PLAN FOR NEXT SESSION: Nustep, manual therapy, isometrics, and modalities as needed   [place]anville Lewis, PT 09/19/2022, 12:13 PM

## 2022-09-20 ENCOUNTER — Encounter: Payer: Self-pay | Admitting: Orthopaedic Surgery

## 2022-09-20 ENCOUNTER — Ambulatory Visit (INDEPENDENT_AMBULATORY_CARE_PROVIDER_SITE_OTHER): Payer: 59 | Admitting: Orthopaedic Surgery

## 2022-09-20 ENCOUNTER — Other Ambulatory Visit (INDEPENDENT_AMBULATORY_CARE_PROVIDER_SITE_OTHER): Payer: 59

## 2022-09-20 VITALS — Ht 67.0 in | Wt 206.0 lb

## 2022-09-20 DIAGNOSIS — M545 Low back pain, unspecified: Secondary | ICD-10-CM

## 2022-09-20 NOTE — Progress Notes (Signed)
Office Visit Note   Patient: Brittany Durham           Date of Birth: 1970/08/21           MRN: [id number] Visit Date: 09/20/2022              Requested by: [name], FNP [id number] [place],  [place] [address] PCP: [name], FNP   Assessment & Plan: Visit Diagnoses:  1. Acute left-sided low back pain, unspecified whether sciatica present     Plan: Patient will proceed with therapy and recheck in 5 weeks if she is having persistent symptoms and failing to respond that we can consider diagnostic MRI imaging.  Follow-Up Instructions: Return in about 5 weeks (around 10/25/2022).   Orders:  Orders Placed This Encounter  Procedures   XR Lumbar Spine 2-3 Views   No orders of the defined types were placed in this encounter.     Procedures: No procedures performed   Clinical Data: No additional findings.   Subjective: Chief Complaint  Patient presents with   Lower Back - Pain    HPI 51 year old female partially treated by May 2019 per PVNS left knee is seen with a new problem which is low back pain after she was sleeping in her grandsons bed second week of April.  She had trouble getting upright the following morning has had aching pain in her back no pain down her legs stiffness problems bending turning twisting.  She has been treated with Celebrex also prednisone Dosepak without relief.  No associated bowel or bladder symptoms.  Physical therapy was ordered and she just had her initial visit with evaluation only.  Therapy starting next week.  Patient does have history of GERD tachycardia hypertension.  PVNS left knee.  CT scan for abdominal pain rule out kidney stone noted 07/26/2022 and reviewed with her today.  Review of Systems other systems noncontributory to HPI.   Objective: Vital Signs: Ht 5\' 7"  (1.702 m)   Wt 206 lb (93.4 kg)   LMP 09/16/2017   BMI 32.26 kg/m   Physical Exam Constitutional:      Appearance: She is well-developed.  HENT:      Head: Normocephalic.     Right Ear: External ear normal.     Left Ear: External ear normal. There is no impacted cerumen.  Eyes:     Pupils: Pupils are equal, round, and reactive to light.  Neck:     Thyroid: No thyromegaly.     Trachea: No tracheal deviation.  Cardiovascular:     Rate and Rhythm: Normal rate.  Pulmonary:     Effort: Pulmonary effort is normal.  Abdominal:     Palpations: Abdomen is soft.  Musculoskeletal:     Cervical back: No rigidity.  Skin:    General: Skin is warm and dry.  Neurological:     Mental Status: She is alert and oriented to person, place, and time.  Psychiatric:        Behavior: Behavior normal.     Ortho Exam patient is able to get from sitting standing she has tenderness lumbar spine.  Normal heel-toe gait.  No rash or exposed skin sensation is intact.  Specialty Comments:  No specialty comments available.  Imaging: No results found.   PMFS History: Patient Active Problem List   Diagnosis Date Noted   Biliary dyskinesia 02/01/2021   Right flank pain 10/27/2020   Iron deficiency anemia due to chronic blood loss 09/15/2020  Symptomatic anemia 07/20/2020   Depression, major, single episode, moderate (HCC) 07/15/2020   Vertigo 01/26/2020   GAD (generalized anxiety disorder) 07/28/2019   Radiculopathy, cervical region 07/16/2019   S/P cervical spinal fusion 03/20/2019   Osteoarthritis 11/18/2017   Insomnia 11/18/2017   Synovitis, villonodular, knee, right 10/28/2017   Tear of lateral meniscus of right knee, current 10/28/2017   Chondromalacia patellae, 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   Language difficulty 09/25/2016   Nausea with vomiting 07/18/2016   PUD (peptic ulcer disease) 07/18/2016   Slurred speech    GI bleed 05/02/2016   Sinus tachycardia 05/02/2016   Acute blood loss anemia 05/02/2016   Speech disorder 03/31/2015   Thyroid nodule 12/14/2014   Left knee pain 12/14/2014   Seizures (HCC)    Vitamin D deficiency 06/30/2014    Gastroesophageal reflux disease without esophagitis 06/29/2014   SYNCOPE 03/02/2010   HYPERTENSION, BENIGN 03/01/2010   Past Medical History:  Diagnosis Date   Anemia    Asthma    childhood   Carotidynia    COPD (chronic obstructive pulmonary disease) (HCC)    DDD (degenerative disc disease), cervical    GERD (gastroesophageal reflux disease)    Hypertension    Knee pain    left meniscus tear s/p repair 11/2014   Seizures (HCC)    last seizure was over 10 years ago, unknown etiology   Stuttering    patient reports this started after her knee surgery in 11/2014.    Thyroid nodule     Family History  Problem Relation Age of Onset   Hypertension Mother    Lung cancer Father    Hypertension Brother    Colon cancer Neg Hx    Gastric cancer Neg Hx    Esophageal cancer Neg Hx     Past Surgical History:  Procedure Laterality Date   ANTERIOR CERVICAL DECOMP/DISCECTOMY FUSION N/A 03/20/2019   Procedure: Anterior Cervical Decompression Fusion - Cervical Four-Cervical Five - Cervical Five-Cervical Six;  Surgeon: Tia Alert, MD;  Location: [facility] OR;  Service: Neurosurgery;  Laterality: N/A;  Anterior Cervical Decompression Fusion - Cervical Four-Cervical Five - Cervical Five-Cervical Six   BIOPSY  09/20/2020   Procedure: BIOPSY;  Surgeon: Corbin Ade, MD;  [facility];  Service: Endoscopy;;   COLONOSCOPY N/A 08/23/2016   Procedure: COLONOSCOPY;  Surgeon: Corbin Ade, MD;  [facility];  Service: Endoscopy;  Laterality: N/A;  1:00pm   COLONOSCOPY N/A 08/24/2020   Diverticulosis in the entire examined colon. Redundant colon. Exam otherwise normal   ESOPHAGOGASTRODUODENOSCOPY N/A 05/03/2016   Procedure: ESOPHAGOGASTRODUODENOSCOPY (EGD);  Surgeon: Corbin Ade, MD;  [facility];  Service: Endoscopy;  Laterality: N/A;   ESOPHAGOGASTRODUODENOSCOPY N/A 08/23/2016   Procedure: ESOPHAGOGASTRODUODENOSCOPY (EGD);  Surgeon: Corbin Ade, MD;   [facility];  Service: Endoscopy;  Laterality: N/A;   ESOPHAGOGASTRODUODENOSCOPY (EGD) WITH PROPOFOL N/A 09/20/2020   normal esophagus. Large hiatal hernia. Multiple gastric erosions (Cameron lesions). Normal duodenal bulb and second portion of the duodenum. Suspicion Sheria Lang lesions causing intermittent bleeding   KNEE ARTHROSCOPY Right    KNEE ARTHROSCOPY WITH MEDIAL MENISECTOMY Left 12/09/2014   Procedure: KNEE ARTHROSCOPY WITH PARTIAL MEDIAL AND LATERAL MENISECTOMY;  Surgeon: Darreld Mclean, MD;  Location: [facility];  Service: Orthopedics;  Laterality: Left;   TUBAL LIGATION     WISDOM TOOTH EXTRACTION     Social History   Occupational History   Occupation: Umemployed    Comment: Disability  Tobacco Use  Smoking status: Never   Smokeless tobacco: Never  Vaping Use   Vaping Use: Never used  Substance and Sexual Activity   Alcohol use: No   Drug use: No   Sexual activity: Not Currently    Birth control/protection: Surgical

## 2022-09-21 ENCOUNTER — Inpatient Hospital Stay: Payer: 59 | Attending: Hematology

## 2022-09-21 VITALS — BP 107/75 | HR 89 | Temp 97.5°F | Resp 18

## 2022-09-21 DIAGNOSIS — D509 Iron deficiency anemia, unspecified: Secondary | ICD-10-CM | POA: Diagnosis not present

## 2022-09-21 DIAGNOSIS — D62 Acute posthemorrhagic anemia: Secondary | ICD-10-CM

## 2022-09-21 DIAGNOSIS — D500 Iron deficiency anemia secondary to blood loss (chronic): Secondary | ICD-10-CM

## 2022-09-21 MED ORDER — FAMOTIDINE 20 MG IN NS 100 ML IVPB
20.0000 mg | Freq: Once | INTRAVENOUS | Status: AC
  Administered 2022-09-21: 20 mg via INTRAVENOUS
  Filled 2022-09-21: qty 100

## 2022-09-21 MED ORDER — SODIUM CHLORIDE 0.9 % IV SOLN
510.0000 mg | Freq: Once | INTRAVENOUS | Status: AC
  Administered 2022-09-21: 510 mg via INTRAVENOUS
  Filled 2022-09-21: qty 510

## 2022-09-21 MED ORDER — SODIUM CHLORIDE 0.9 % IV SOLN: Freq: Once | INTRAVENOUS | Status: AC

## 2022-09-21 MED ORDER — CETIRIZINE HCL 10 MG PO TABS
10.0000 mg | ORAL_TABLET | Freq: Once | ORAL | Status: AC
  Administered 2022-09-21: 10 mg via ORAL
  Filled 2022-09-21: qty 1

## 2022-09-21 MED ORDER — METHYLPREDNISOLONE SODIUM SUCC 125 MG IJ SOLR
125.0000 mg | Freq: Once | INTRAMUSCULAR | Status: AC
  Administered 2022-09-21: 125 mg via INTRAVENOUS
  Filled 2022-09-21: qty 2

## 2022-09-21 MED ORDER — FAMOTIDINE IN NACL 20-0.9 MG/50ML-% IV SOLN: 20.0000 mg | Freq: Once | INTRAVENOUS | Status: DC

## 2022-09-21 NOTE — Patient Instructions (Signed)
[facility] [place]  Discharge Instructions: Thank you for choosing [facility] to provide your oncology and hematology care.  If you have a lab appointment with the Cancer Center - please note that after April 8th, 2024, all labs will be drawn in the cancer center.  You do not have to check in or register with the main entrance as you have in the past but will complete your check-in in the cancer center.  Wear comfortable clothing and clothing appropriate for easy access to any Portacath or PICC line.   We strive to give you quality time with your provider. You may need to reschedule your appointment if you arrive late (15 or more minutes).  Arriving late affects you and other patients whose appointments are after yours.  Also, if you miss three or more appointments without notifying the office, you may be dismissed from the clinic at the provider's discretion.      For prescription refill requests, have your pharmacy contact our office and allow 72 hours for refills to be completed.    Today you received the following chemotherapy and/or immunotherapy agents Feraheme      To help prevent nausea and vomiting after your treatment, we encourage you to take your nausea medication as directed.  BELOW ARE SYMPTOMS THAT SHOULD BE REPORTED IMMEDIATELY: *FEVER GREATER THAN 100.4 F (38 C) OR HIGHER *CHILLS OR SWEATING *NAUSEA AND VOMITING THAT IS NOT CONTROLLED WITH YOUR NAUSEA MEDICATION *UNUSUAL SHORTNESS OF BREATH *UNUSUAL BRUISING OR BLEEDING *URINARY PROBLEMS (pain or burning when urinating, or frequent urination) *BOWEL PROBLEMS (unusual diarrhea, constipation, pain near the anus) TENDERNESS IN MOUTH AND THROAT WITH OR WITHOUT PRESENCE OF ULCERS (sore throat, sores in mouth, or a toothache) UNUSUAL RASH, SWELLING OR PAIN  UNUSUAL VAGINAL DISCHARGE OR ITCHING   Items with * indicate a potential emergency and should be followed up as soon as possible or go to the  Emergency Department if any problems should occur.  Please show the CHEMOTHERAPY ALERT CARD or IMMUNOTHERAPY ALERT CARD at check-in to the Emergency Department and triage nurse.  Should you have questions after your visit or need to cancel or reschedule your appointment, please contact [facility] [place] [phone]  and follow the prompts.  Office hours are 8:00 a.m. to 4:30 p.m. Monday - Friday. Please note that voicemails left after 4:00 p.m. may not be returned until the following business day.  We are closed weekends and major holidays. You have access to a nurse at all times for urgent questions. Please call the main number to the clinic [phone] and follow the prompts.  For any non-urgent questions, you may also contact your provider using MyChart. We now offer e-Visits for anyone 18 and older to request care online for non-urgent symptoms. For details visit [email or web address].   Also download the MyChart app! Go to the app store, search "MyChart", open the app, select [place], and log in with your MyChart username and password.   

## 2022-09-21 NOTE — Progress Notes (Signed)
Patient presents today for Feraheme infusion per providers order.  Vital signs WNL.  Patient has no new complaints at this time.  Peripheral IV started and blood return noted pre and post infusion.  Stable during infusion without adverse affects.  Vital signs stable.  No complaints at this time.  Discharge from clinic ambulatory in stable condition.  Alert and oriented X 3.  Follow up with [facility] as scheduled.  

## 2022-09-25 ENCOUNTER — Encounter: Payer: Self-pay | Admitting: Physical Therapy

## 2022-09-25 ENCOUNTER — Other Ambulatory Visit: Payer: Self-pay | Admitting: Family

## 2022-09-25 ENCOUNTER — Ambulatory Visit: Payer: 59 | Admitting: Physical Therapy

## 2022-09-25 DIAGNOSIS — M5459 Other low back pain: Secondary | ICD-10-CM

## 2022-09-25 DIAGNOSIS — M6281 Muscle weakness (generalized): Secondary | ICD-10-CM | POA: Diagnosis not present

## 2022-09-25 DIAGNOSIS — M546 Pain in thoracic spine: Secondary | ICD-10-CM

## 2022-09-25 DIAGNOSIS — Z9181 History of falling: Secondary | ICD-10-CM | POA: Diagnosis not present

## 2022-09-25 NOTE — Telephone Encounter (Signed)
Last OV 08/31/22. Last RF 08/31/22.

## 2022-09-25 NOTE — Therapy (Signed)
OUTPATIENT PHYSICAL THERAPY THORACOLUMBAR EVALUATION   Patient Name: Brittany Durham MRN: [id number] DOB:Apr 10, 1971, 52 y.o., female Today's Date: 09/25/2022  END OF SESSION:  PT End of Session - 09/25/22 1045     Visit Number 2    Number of Visits 8    Date for PT Re-Evaluation 11/09/22    PT Start Time 1040    PT Stop Time 1123    PT Time Calculation (min) 43 min    Activity Tolerance Patient limited by pain    Behavior During Therapy [facility] for tasks assessed/performed             Past Medical History:  Diagnosis Date   Anemia    Asthma    childhood   Carotidynia    COPD (chronic obstructive pulmonary disease) (HCC)    DDD (degenerative disc disease), cervical    GERD (gastroesophageal reflux disease)    Hypertension    Knee pain    left meniscus tear s/p repair 11/2014   Seizures (HCC)    last seizure was over 10 years ago, unknown etiology   Stuttering    patient reports this started after her knee surgery in 11/2014.    Thyroid nodule    Past Surgical History:  Procedure Laterality Date   ANTERIOR CERVICAL DECOMP/DISCECTOMY FUSION N/A 03/20/2019   Procedure: Anterior Cervical Decompression Fusion - Cervical Four-Cervical Five - Cervical Five-Cervical Six;  Surgeon: Tia Alert, MD;  Location: [facility];  Service: Neurosurgery;  Laterality: N/A;  Anterior Cervical Decompression Fusion - Cervical Four-Cervical Five - Cervical Five-Cervical Six   BIOPSY  09/20/2020   Procedure: BIOPSY;  Surgeon: Corbin Ade, MD;  Location: [facility];  Service: Endoscopy;;   COLONOSCOPY N/A 08/23/2016   Procedure: COLONOSCOPY;  Surgeon: Corbin Ade, MD;  Location: [facility];  Service: Endoscopy;  Laterality: N/A;  1:00pm   COLONOSCOPY N/A 08/24/2020   Diverticulosis in the entire examined colon. Redundant colon. Exam otherwise normal   ESOPHAGOGASTRODUODENOSCOPY N/A 05/03/2016   Procedure: ESOPHAGOGASTRODUODENOSCOPY (EGD);  Surgeon: Corbin Ade, MD;  Location: [facility];  Service: Endoscopy;  Laterality: N/A;   ESOPHAGOGASTRODUODENOSCOPY N/A 08/23/2016   Procedure: ESOPHAGOGASTRODUODENOSCOPY (EGD);  Surgeon: Corbin Ade, MD;  Location: [facility];  Service: Endoscopy;  Laterality: N/A;   ESOPHAGOGASTRODUODENOSCOPY (EGD) WITH PROPOFOL N/A 09/20/2020   normal esophagus. Large hiatal hernia. Multiple gastric erosions (Cameron lesions). Normal duodenal bulb and second portion of the duodenum. Suspicion Sheria Lang lesions causing intermittent bleeding   KNEE ARTHROSCOPY Right    KNEE ARTHROSCOPY WITH MEDIAL MENISECTOMY Left 12/09/2014   Procedure: KNEE ARTHROSCOPY WITH PARTIAL MEDIAL AND LATERAL MENISECTOMY;  Surgeon: Darreld Mclean, MD;  Location: [facility] ORS;  Service: Orthopedics;  Laterality: Left;   TUBAL LIGATION     WISDOM TOOTH EXTRACTION     Patient Active Problem List   Diagnosis Date Noted   Biliary dyskinesia 02/01/2021   Right flank pain 10/27/2020   Iron deficiency anemia due to chronic blood loss 09/15/2020   Symptomatic anemia 07/20/2020   Depression, major, single episode, moderate (HCC) 07/15/2020   Vertigo 01/26/2020   GAD (generalized anxiety disorder) 07/28/2019   Radiculopathy, cervical region 07/16/2019   S/P cervical spinal fusion 03/20/2019   Osteoarthritis 11/18/2017   Insomnia 11/18/2017   Synovitis, villonodular, knee, right 10/28/2017   Tear of lateral meniscus of right knee, current 10/28/2017   Chondromalacia patellae, 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   Language difficulty 09/25/2016   Nausea with vomiting 07/18/2016   PUD (  peptic ulcer disease) 07/18/2016   Slurred speech    GI bleed 05/02/2016   Sinus tachycardia 05/02/2016   Acute blood loss anemia 05/02/2016   Speech disorder 03/31/2015   Thyroid nodule 12/14/2014   Left knee pain 12/14/2014   Seizures (HCC)    Vitamin D deficiency 06/30/2014   Gastroesophageal reflux disease without esophagitis 06/29/2014   SYNCOPE 03/02/2010   HYPERTENSION, BENIGN 03/01/2010    REFERRING PROVIDER: [name], FNP   REFERRING DIAG: Acute left-sided thoracic back pain   Rationale for Evaluation and Treatment: Rehabilitation  THERAPY DIAG:  Other low back pain  ONSET DATE: April 2024   SUBJECTIVE:                                                                                                                                                                                           SUBJECTIVE STATEMENT: Hurting a lot today.   PERTINENT HISTORY:  HTN, OA, history of seizures, anxiety, depression, asthma, and COPD  PAIN:  Are you having pain? Yes: NPRS scale: 10/10 Pain location: left low back Pain description: constant sharp pain  Aggravating factors: none known Relieving factors: taking a hot shower   PRECAUTIONS: None  PATIENT GOALS: reduced pain, be abel to walk longer, and be able to sleep better  NEXT MD VISIT: 09/20/22 (Dr. Ophelia Charter)   OBJECTIVE:   DIAGNOSTIC FINDINGS: 08/31/22 Thoracic x-ray IMPRESSION: Negative.  SCREENING FOR RED FLAGS: Bowel or bladder incontinence: No Spinal tumors: No Cauda equina syndrome: No Compression fracture: No Abdominal aneurysm: No  COGNITION: Overall cognitive status: Within functional limits for tasks assessed     SENSATION: Patient reports no numbness or tingling  POSTURE: forward head  PALPATION: Familiar pain reproduced with palpation to: left lumbar paraspinals, QL, and latissimus dorsi  LUMBAR ROM:   AROM eval  Flexion 24; familiar pain  Extension 8; familiar pain  Right lateral flexion   Left lateral flexion   Right rotation 25% limited  Left rotation 25% limited; painful   (Blank rows = not tested)  LOWER EXTREMITY ROM: WFL for activities assessed  LOWER EXTREMITY MMT:    MMT Right eval Left eval  Hip flexion 4+/5; low back pain  4+/5  Hip extension    Hip abduction    Hip adduction    Hip internal rotation    Hip external rotation    Knee flexion 4+/5 4+//5  Knee  extension 4+/5 5/5  Ankle dorsiflexion 4/5 4/5  Ankle plantarflexion    Ankle inversion    Ankle eversion     (Blank rows = not tested)  LUMBAR SPECIAL TESTS:  Unable to  be assessed due to pain severity and irritability  GAIT: Assistive device utilized: None Level of assistance: Complete Independence Comments: decreased gait speed and stride length  TODAY'S TREATMENT:                                                                                                                              DATE:    5/14  EXERCISE LOG  Exercise Repetitions and Resistance Comments  Nustep  L3 x12 min                    Blank cell = exercise not performed today   Manual Therapy Soft Tissue Mobilization: B lumbar paraspinals/ QL, reduce guarding and pain  Modalities  Date: 09/25/22 Unattended Estim: Lumbar, IFC, 10 mins, Pain and Tone  PATIENT EDUCATION:  Education details: POC, healing, prognosis, anatomy, and goals for therapy Person educated: Patient Education method: Explanation Education comprehension: verbalized understanding  HOME EXERCISE PROGRAM:   ASSESSMENT:  CLINICAL IMPRESSION: Patient presented in clinic with reports of increased LBP especially L side. Patient guarded with all transfers and bed mobility due to pain. Intermittent facial grimacing observed with certain movements for transfers. Increased muscle guarding noted in L lumbar musculature with tenderness also reported. Normal stimulation response noted following removal of the modality.  OBJECTIVE IMPAIRMENTS: Abnormal gait, decreased activity tolerance, decreased mobility, difficulty walking, decreased ROM, decreased strength, hypomobility, impaired tone, postural dysfunction, and pain.   ACTIVITY LIMITATIONS: carrying, lifting, bending, sitting, standing, sleeping, stairs, transfers, bed mobility, locomotion level, and caring for others  PARTICIPATION LIMITATIONS: meal prep, cleaning, laundry, shopping, and  community activity  PERSONAL FACTORS: Time since onset of injury/illness/exacerbation and 3+ comorbidities: HTN, OA, history of seizures, anxiety, depression, asthma, and COPD  are also affecting patient's functional outcome.   REHAB POTENTIAL: Fair    CLINICAL DECISION MAKING: Unstable/unpredictable  EVALUATION COMPLEXITY: High   GOALS: Goals reviewed with patient? Yes  LONG TERM GOALS: Target date: 10/17/22  Patient will be independent with her HEP.  Baseline:  Goal status: INITIAL  2.  Patient will be able to complete her daily activities without her familiar pain exceeding 8/10.  Baseline:  Goal status: INITIAL  3.  Patient will be able to demonstrate at least 30 degrees of lumbar flexion for improved function picking items up from the floor.  Baseline:  Goal status: INITIAL  4.  Patient will report being able to play with her grandchildren without being limited by her familiar low back pain.  Baseline:  Goal status: INITIAL  5.  Patient will report being able to sleep at least 6 hours without being awakened by her familiar low back pain.  Baseline:  Goal status: INITIAL  PLAN:  PT FREQUENCY: 2x/week  PT DURATION: 4 weeks  PLANNED INTERVENTIONS: Therapeutic exercises, Therapeutic activity, Neuromuscular re-education, Gait training, Patient/Family education, Self Care, Joint mobilization, Stair training, Dry Needling, Electrical stimulation, Spinal mobilization, Cryotherapy, Moist heat, Ultrasound, Manual therapy, and Re-evaluation.  PLAN FOR NEXT SESSION: Nustep, manual therapy, isometrics, and modalities as needed   Marvell Fuller, PTA 09/25/2022, 12:11 PM

## 2022-09-27 ENCOUNTER — Ambulatory Visit: Payer: 59 | Admitting: Physical Therapy

## 2022-09-27 ENCOUNTER — Encounter: Payer: Self-pay | Admitting: Physical Therapy

## 2022-09-27 DIAGNOSIS — M6281 Muscle weakness (generalized): Secondary | ICD-10-CM | POA: Diagnosis not present

## 2022-09-27 DIAGNOSIS — Z9181 History of falling: Secondary | ICD-10-CM | POA: Diagnosis not present

## 2022-09-27 DIAGNOSIS — M5459 Other low back pain: Secondary | ICD-10-CM

## 2022-09-27 DIAGNOSIS — M546 Pain in thoracic spine: Secondary | ICD-10-CM | POA: Diagnosis not present

## 2022-09-27 NOTE — Therapy (Signed)
OUTPATIENT PHYSICAL THERAPY THORACOLUMBAR TREATMENT   Patient Name: Brittany Durham MRN: [id number] DOB:08-21-1970, 52 y.o., female Today's Date: 09/27/2022  END OF SESSION:  PT End of Session - 09/27/22 1042     Visit Number 3    Number of Visits 8    Date for PT Re-Evaluation 11/09/22    PT Start Time 1037    PT Stop Time 1124    PT Time Calculation (min) 47 min    Activity Tolerance Patient tolerated treatment well    Behavior During Therapy WFL for tasks assessed/performed            Past Medical History:  Diagnosis Date   Anemia    Asthma    childhood   Carotidynia    COPD (chronic obstructive pulmonary disease) (HCC)    DDD (degenerative disc disease), cervical    GERD (gastroesophageal reflux disease)    Hypertension    Knee pain    left meniscus tear s/p repair 11/2014   Seizures (HCC)    last seizure was over 10 years ago, unknown etiology   Stuttering    patient reports this started after her knee surgery in 11/2014.    Thyroid nodule    Past Surgical History:  Procedure Laterality Date   ANTERIOR CERVICAL DECOMP/DISCECTOMY FUSION N/A 03/20/2019   Procedure: Anterior Cervical Decompression Fusion - Cervical Four-Cervical Five - Cervical Five-Cervical Six;  Surgeon: Tia Alert, MD;  Location: [facility] OR;  Service: Neurosurgery;  Laterality: N/A;  Anterior Cervical Decompression Fusion - Cervical Four-Cervical Five - Cervical Five-Cervical Six   BIOPSY  09/20/2020   Procedure: BIOPSY;  Surgeon: Corbin Ade, MD;  Location: [facility];  Service: Endoscopy;;   COLONOSCOPY N/A 08/23/2016   Procedure: COLONOSCOPY;  Surgeon: Corbin Ade, MD;  Location: [facility];  Service: Endoscopy;  Laterality: N/A;  1:00pm   COLONOSCOPY N/A 08/24/2020   Diverticulosis in the entire examined colon. Redundant colon. Exam otherwise normal   ESOPHAGOGASTRODUODENOSCOPY N/A 05/03/2016   Procedure: ESOPHAGOGASTRODUODENOSCOPY (EGD);  Surgeon: Corbin Ade, MD;  Location: [facility];  Service: Endoscopy;  Laterality: N/A;   ESOPHAGOGASTRODUODENOSCOPY N/A 08/23/2016   Procedure: ESOPHAGOGASTRODUODENOSCOPY (EGD);  Surgeon: Corbin Ade, MD;  Location: [facility];  Service: Endoscopy;  Laterality: N/A;   ESOPHAGOGASTRODUODENOSCOPY (EGD) WITH PROPOFOL N/A 09/20/2020   normal esophagus. Large hiatal hernia. Multiple gastric erosions (Cameron lesions). Normal duodenal bulb and second portion of the duodenum. Suspicion Sheria Lang lesions causing intermittent bleeding   KNEE ARTHROSCOPY Right    KNEE ARTHROSCOPY WITH MEDIAL MENISECTOMY Left 12/09/2014   Procedure: KNEE ARTHROSCOPY WITH PARTIAL MEDIAL AND LATERAL MENISECTOMY;  Surgeon: Darreld Mclean, MD;  Location: [facility];  Service: Orthopedics;  Laterality: Left;   TUBAL LIGATION     WISDOM TOOTH EXTRACTION     Patient Active Problem List   Diagnosis Date Noted   Biliary dyskinesia 02/01/2021   Right flank pain 10/27/2020   Iron deficiency anemia due to chronic blood loss 09/15/2020   Symptomatic anemia 07/20/2020   Depression, major, single episode, moderate (HCC) 07/15/2020   Vertigo 01/26/2020   GAD (generalized anxiety disorder) 07/28/2019   Radiculopathy, cervical region 07/16/2019   S/P cervical spinal fusion 03/20/2019   Osteoarthritis 11/18/2017   Insomnia 11/18/2017   Synovitis, villonodular, knee, right 10/28/2017   Tear of lateral meniscus of right knee, current 10/28/2017   Chondromalacia patellae, 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   Language difficulty 09/25/2016   Nausea with vomiting 07/18/2016   PUD (peptic  ulcer disease) 07/18/2016   Slurred speech    GI bleed 05/02/2016   Sinus tachycardia 05/02/2016   Acute blood loss anemia 05/02/2016   Speech disorder 03/31/2015   Thyroid nodule 12/14/2014   Left knee pain 12/14/2014   Seizures (HCC)    Vitamin D deficiency 06/30/2014   Gastroesophageal reflux disease without esophagitis 06/29/2014   SYNCOPE 03/02/2010   HYPERTENSION, BENIGN 03/01/2010    REFERRING PROVIDER: [name], FNP   REFERRING DIAG: Acute left-sided thoracic back pain   Rationale for Evaluation and Treatment: Rehabilitation  THERAPY DIAG:  Other low back pain  ONSET DATE: April 2024   SUBJECTIVE:                                                                                                                                                                                           SUBJECTIVE STATEMENT: Has minimal improvement.  PERTINENT HISTORY:  HTN, OA, history of seizures, anxiety, depression, asthma, and COPD  PAIN:  Are you having pain? Yes: NPRS scale: 8/10 Pain location: left low back Pain description: constant sharp pain  Aggravating factors: none known Relieving factors: taking a hot shower   PRECAUTIONS: None  PATIENT GOALS: reduced pain, be abel to walk longer, and be able to sleep better  NEXT MD VISIT: 09/20/22 (Dr. Ophelia Charter)   OBJECTIVE:   DIAGNOSTIC FINDINGS: 08/31/22 Thoracic x-ray IMPRESSION: Negative.  SCREENING FOR RED FLAGS: Bowel or bladder incontinence: No Spinal tumors: No Cauda equina syndrome: No Compression fracture: No Abdominal aneurysm: No  COGNITION: Overall cognitive status: Within functional limits for tasks assessed     SENSATION: Patient reports no numbness or tingling  POSTURE: forward head  PALPATION: Familiar pain reproduced with palpation to: left lumbar paraspinals, QL, and latissimus dorsi  LUMBAR ROM:   AROM eval  Flexion 24; familiar pain  Extension 8; familiar pain  Right lateral flexion   Left lateral flexion   Right rotation 25% limited  Left rotation 25% limited; painful   (Blank rows = not tested)  LOWER EXTREMITY ROM: WFL for activities assessed  LOWER EXTREMITY MMT:    MMT Right eval Left eval  Hip flexion 4+/5; low back pain  4+/5  Hip extension    Hip abduction    Hip adduction    Hip internal rotation    Hip external rotation    Knee flexion 4+/5 4+//5  Knee  extension 4+/5 5/5  Ankle dorsiflexion 4/5 4/5  Ankle plantarflexion    Ankle inversion    Ankle eversion     (Blank rows = not tested)  LUMBAR SPECIAL TESTS:  Unable to be assessed due  to pain severity and irritability  GAIT: Assistive device utilized: None Level of assistance: Complete Independence Comments: decreased gait speed and stride length  TODAY'S TREATMENT:                                                                                                                              DATE:    5/16  EXERCISE LOG  Exercise Repetitions and Resistance Comments  Nustep  L3 x15 min                    Blank cell = exercise not performed today   Manual Therapy Soft Tissue Mobilization: L lumbar paraspinals/ QL, reduce guarding and pain  Modalities  Date: 09/27/22 Unattended Estim: Lumbar, IFC, 10 mins, Pain and Tone Ultrasound: Lumbar, 1.5 w/cm2, 100%, 1 mhz, 10 mins, Pain and Tone  PATIENT EDUCATION:  Education details: POC, healing, prognosis, anatomy, and goals for therapy Person educated: Patient Education method: Explanation Education comprehension: verbalized understanding  HOME EXERCISE PROGRAM:   ASSESSMENT:  CLINICAL IMPRESSION: Patient presented in clinic with minimal reduction of LBP. Less guarding and facial grimacing noted with transfers and bed mobility. Patient still moderately sensitive to palpation of L lumbar musculature with moderate tone noted. Normal modalities response noted following removal of the modalities.  OBJECTIVE IMPAIRMENTS: Abnormal gait, decreased activity tolerance, decreased mobility, difficulty walking, decreased ROM, decreased strength, hypomobility, impaired tone, postural dysfunction, and pain.   ACTIVITY LIMITATIONS: carrying, lifting, bending, sitting, standing, sleeping, stairs, transfers, bed mobility, locomotion level, and caring for others  PARTICIPATION LIMITATIONS: meal prep, cleaning, laundry, shopping, and community  activity  PERSONAL FACTORS: Time since onset of injury/illness/exacerbation and 3+ comorbidities: HTN, OA, history of seizures, anxiety, depression, asthma, and COPD  are also affecting patient's functional outcome.   REHAB POTENTIAL: Fair    CLINICAL DECISION MAKING: Unstable/unpredictable  EVALUATION COMPLEXITY: High   GOALS: Goals reviewed with patient? Yes  LONG TERM GOALS: Target date: 10/17/22  Patient will be independent with her HEP.  Baseline:  Goal status: INITIAL  2.  Patient will be able to complete her daily activities without her familiar pain exceeding 8/10.  Baseline:  Goal status: INITIAL  3.  Patient will be able to demonstrate at least 30 degrees of lumbar flexion for improved function picking items up from the floor.  Baseline:  Goal status: INITIAL  4.  Patient will report being able to play with her grandchildren without being limited by her familiar low back pain.  Baseline:  Goal status: INITIAL  5.  Patient will report being able to sleep at least 6 hours without being awakened by her familiar low back pain.  Baseline:  Goal status: INITIAL  PLAN:  PT FREQUENCY: 2x/week  PT DURATION: 4 weeks  PLANNED INTERVENTIONS: Therapeutic exercises, Therapeutic activity, Neuromuscular re-education, Gait training, Patient/Family education, Self Care, Joint mobilization, Stair training, Dry Needling, Electrical stimulation, Spinal mobilization, Cryotherapy, Moist heat, Ultrasound, Manual therapy, and Re-evaluation.  PLAN FOR NEXT SESSION: Nustep, manual therapy, isometrics, and modalities as needed   Marvell Fuller, PTA 09/27/2022, 12:09 PM

## 2022-09-28 ENCOUNTER — Inpatient Hospital Stay: Payer: 59

## 2022-10-01 ENCOUNTER — Ambulatory Visit: Payer: 59 | Admitting: Physical Therapy

## 2022-10-01 ENCOUNTER — Inpatient Hospital Stay: Payer: 59

## 2022-10-04 ENCOUNTER — Ambulatory Visit: Payer: 59 | Admitting: Physical Therapy

## 2022-10-04 ENCOUNTER — Encounter: Payer: Self-pay | Admitting: Physical Therapy

## 2022-10-04 DIAGNOSIS — Z9181 History of falling: Secondary | ICD-10-CM | POA: Diagnosis not present

## 2022-10-04 DIAGNOSIS — M6281 Muscle weakness (generalized): Secondary | ICD-10-CM

## 2022-10-04 DIAGNOSIS — M546 Pain in thoracic spine: Secondary | ICD-10-CM | POA: Diagnosis not present

## 2022-10-04 DIAGNOSIS — M5459 Other low back pain: Secondary | ICD-10-CM

## 2022-10-04 NOTE — Therapy (Signed)
OUTPATIENT PHYSICAL THERAPY THORACOLUMBAR TREATMENT   Patient Name: Brittany Durham MRN: [id number] DOB:11-09-1970, 52 y.o., female Today's Date: 10/04/2022  END OF SESSION:  PT End of Session - 10/04/22 1011     Visit Number 4    Number of Visits 8    Date for PT Re-Evaluation 11/09/22    PT Start Time 0955   MHP not included in billing   PT Stop Time 1027    PT Time Calculation (min) 32 min    Activity Tolerance Patient tolerated treatment well    Behavior During Therapy [facility] for tasks assessed/performed             Past Medical History:  Diagnosis Date   Anemia    Asthma    childhood   Carotidynia    COPD (chronic obstructive pulmonary disease) (HCC)    DDD (degenerative disc disease), cervical    GERD (gastroesophageal reflux disease)    Hypertension    Knee pain    left meniscus tear s/p repair 11/2014   Seizures (HCC)    last seizure was over 10 years ago, unknown etiology   Stuttering    patient reports this started after her knee surgery in 11/2014.    Thyroid nodule    Past Surgical History:  Procedure Laterality Date   ANTERIOR CERVICAL DECOMP/DISCECTOMY FUSION N/A 03/20/2019   Procedure: Anterior Cervical Decompression Fusion - Cervical Four-Cervical Five - Cervical Five-Cervical Six;  Surgeon: Tia Alert, MD;  Location: [facility] OR;  Service: Neurosurgery;  Laterality: N/A;  Anterior Cervical Decompression Fusion - Cervical Four-Cervical Five - Cervical Five-Cervical Six   BIOPSY  09/20/2020   Procedure: BIOPSY;  Surgeon: Corbin Ade, MD;  [facility];  Service: Endoscopy;;   COLONOSCOPY N/A 08/23/2016   Procedure: COLONOSCOPY;  Surgeon: Corbin Ade, MD;  Location: [facility];  Service: Endoscopy;  Laterality: N/A;  1:00pm   COLONOSCOPY N/A 08/24/2020   Diverticulosis in the entire examined colon. Redundant colon. Exam otherwise normal   ESOPHAGOGASTRODUODENOSCOPY N/A 05/03/2016   Procedure: ESOPHAGOGASTRODUODENOSCOPY (EGD);  Surgeon:  Corbin Ade, MD;  [facility];  Service: Endoscopy;  Laterality: N/A;   ESOPHAGOGASTRODUODENOSCOPY N/A 08/23/2016   Procedure: ESOPHAGOGASTRODUODENOSCOPY (EGD);  Surgeon: Corbin Ade, MD;  [facility];  Service: Endoscopy;  Laterality: N/A;   ESOPHAGOGASTRODUODENOSCOPY (EGD) WITH PROPOFOL N/A 09/20/2020   normal esophagus. Large hiatal hernia. Multiple gastric erosions (Cameron lesions). Normal duodenal bulb and second portion of the duodenum. Suspicion Sheria Lang lesions causing intermittent bleeding   KNEE ARTHROSCOPY Right    KNEE ARTHROSCOPY WITH MEDIAL MENISECTOMY Left 12/09/2014   Procedure: KNEE ARTHROSCOPY WITH PARTIAL MEDIAL AND LATERAL MENISECTOMY;  Surgeon: Darreld Mclean, MD;  Location: [facility];  Service: Orthopedics;  Laterality: Left;   TUBAL LIGATION     WISDOM TOOTH EXTRACTION     Patient Active Problem List   Diagnosis Date Noted   Biliary dyskinesia 02/01/2021   Right flank pain 10/27/2020   Iron deficiency anemia due to chronic blood loss 09/15/2020   Symptomatic anemia 07/20/2020   Depression, major, single episode, moderate (HCC) 07/15/2020   Vertigo 01/26/2020   GAD (generalized anxiety disorder) 07/28/2019   Radiculopathy, cervical region 07/16/2019   S/P cervical spinal fusion 03/20/2019   Osteoarthritis 11/18/2017   Insomnia 11/18/2017   Synovitis, villonodular, knee, right 10/28/2017   Tear of lateral meniscus of right knee, current 10/28/2017   Chondromalacia patellae, 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   Language difficulty 09/25/2016   Nausea  with vomiting 07/18/2016   PUD (peptic ulcer disease) 07/18/2016   Slurred speech    GI bleed 05/02/2016   Sinus tachycardia 05/02/2016   Acute blood loss anemia 05/02/2016   Speech disorder 03/31/2015   Thyroid nodule 12/14/2014   Left knee pain 12/14/2014   Seizures (HCC)    Vitamin D deficiency 06/30/2014   Gastroesophageal reflux disease without esophagitis 06/29/2014   SYNCOPE 03/02/2010    HYPERTENSION, BENIGN 03/01/2010   REFERRING PROVIDER: [name], FNP   REFERRING DIAG: Acute left-sided thoracic back pain   Rationale for Evaluation and Treatment: Rehabilitation  THERAPY DIAG:  Other low back pain  History of falling  Muscle weakness (generalized)  ONSET DATE: April 2024   SUBJECTIVE:                                                                                                                                                                                           SUBJECTIVE STATEMENT:  I'm hurting today, not noticing a big difference in how I'm feeling or what I've been able to do.   PERTINENT HISTORY:  HTN, OA, history of seizures, anxiety, depression, asthma, and COPD  PAIN:  Are you having pain? Yes: NPRS scale: 10/10 Pain location: Left low back but is moving to the center  Pain description: stabbing pain Aggravating factors: everything makes it hurt, "doesn't matter"  Relieving factors: ice or heat   PRECAUTIONS: None  PATIENT GOALS: reduced pain, be abel to walk longer, and be able to sleep better  NEXT MD VISIT: 09/20/22 (Dr. Ophelia Charter)   OBJECTIVE:   DIAGNOSTIC FINDINGS: 08/31/22 Thoracic x-ray IMPRESSION: Negative.  SCREENING FOR RED FLAGS: Bowel or bladder incontinence: No Spinal tumors: No Cauda equina syndrome: No Compression fracture: No Abdominal aneurysm: No  COGNITION: Overall cognitive status: Within functional limits for tasks assessed     SENSATION: Patient reports no numbness or tingling  POSTURE: forward head  PALPATION: Familiar pain reproduced with palpation to: left lumbar paraspinals, QL, and latissimus dorsi  LUMBAR ROM:   AROM eval  Flexion 24; familiar pain  Extension 8; familiar pain  Right lateral flexion   Left lateral flexion   Right rotation 25% limited  Left rotation 25% limited; painful   (Blank rows = not tested)  LOWER EXTREMITY ROM: WFL for activities assessed  LOWER EXTREMITY MMT:     MMT Right eval Left eval  Hip flexion 4+/5; low back pain  4+/5  Hip extension    Hip abduction    Hip adduction    Hip internal rotation    Hip external rotation    Knee flexion 4+/5 4+//5  Knee extension 4+/5 5/5  Ankle dorsiflexion 4/5 4/5  Ankle plantarflexion    Ankle inversion    Ankle eversion     (Blank rows = not tested)  LUMBAR SPECIAL TESTS:  Unable to be assessed due to pain severity and irritability  GAIT: Assistive device utilized: None Level of assistance: Complete Independence Comments: decreased gait speed and stride length  TODAY'S TREATMENT:                                                                                                                              DATE:      10/04/22  MHP lumbar spine and glues x8 minutes (not included in billing)  TherEx  Nustep L4 x6 minutes BUEs/BLEs  SKTC 5x5 seconds B  TA sets x15 with 3 second holds  TA set + lumbar rotation 5x5 seconds B PPT x10 with 3 second holds Bridges through partial range x10  Figure 4 piriformis stretch 2x30 seconds B       PATIENT EDUCATION:  Education details: POC, healing, prognosis, anatomy, and goals for therapy Person educated: Patient Education method: Explanation Education comprehension: verbalized understanding  HOME EXERCISE PROGRAM:  Access Code: [email or web address] URL: [email or web address] Date: 10/04/2022 Prepared by: Nedra Hai  Exercises - Supine Transversus Abdominis Bracing - Hands on Stomach  - 3 x daily - 7 x weekly - 1 sets - 10 reps - 3 hold - Supine Single Knee to Chest  - 3 x daily - 7 x weekly - 1 sets - 10 reps - 3 hold - Supine Lower Trunk Rotation  - 3 x daily - 7 x weekly - 1 sets - 10 reps - 3 hold - Supine Posterior Pelvic Tilt  - 3 x daily - 7 x weekly - 1 sets - 10 reps - 3 hold  ASSESSMENT:  CLINICAL IMPRESSION:  Mikael arrives today doing OK, she tells me she still has not had significant improvement in her pain and  function. Held modalities today as they do not seem to be making significant functional changes, focused on use of MHP (major pain relieving factor per pt report) and functional exercises for hip and lumbar mobility with significant improvement in pain noted. Updated HEP. Encouraged her to get personal TENS unit, we can show her how to use this at home so PT can focus on functional exercise progressions moving forward. Pain at 10/10 at beginning of session, down to 4/10 at EOS.   OBJECTIVE IMPAIRMENTS: Abnormal gait, decreased activity tolerance, decreased mobility, difficulty walking, decreased ROM, decreased strength, hypomobility, impaired tone, postural dysfunction, and pain.   ACTIVITY LIMITATIONS: carrying, lifting, bending, sitting, standing, sleeping, stairs, transfers, bed mobility, locomotion level, and caring for others  PARTICIPATION LIMITATIONS: meal prep, cleaning, laundry, shopping, and community activity  PERSONAL FACTORS: Time since onset of injury/illness/exacerbation and 3+ comorbidities: HTN, OA, history of seizures, anxiety, depression, asthma, and COPD  are also affecting patient's functional outcome.   REHAB POTENTIAL: Fair  CLINICAL DECISION MAKING: Unstable/unpredictable  EVALUATION COMPLEXITY: High   GOALS: Goals reviewed with patient? Yes  LONG TERM GOALS: Target date: 10/17/22  Patient will be independent with her HEP.  Baseline:  Goal status: INITIAL  2.  Patient will be able to complete her daily activities without her familiar pain exceeding 8/10.  Baseline:  Goal status: INITIAL  3.  Patient will be able to demonstrate at least 30 degrees of lumbar flexion for improved function picking items up from the floor.  Baseline:  Goal status: INITIAL  4.  Patient will report being able to play with her grandchildren without being limited by her familiar low back pain.  Baseline:  Goal status: INITIAL  5.  Patient will report being able to sleep at least 6  hours without being awakened by her familiar low back pain.  Baseline:  Goal status: INITIAL  PLAN:  PT FREQUENCY: 2x/week  PT DURATION: 4 weeks  PLANNED INTERVENTIONS: Therapeutic exercises, Therapeutic activity, Neuromuscular re-education, Gait training, Patient/Family education, Self Care, Joint mobilization, Stair training, Dry Needling, Electrical stimulation, Spinal mobilization, Cryotherapy, Moist heat, Ultrasound, Manual therapy, and Re-evaluation.  PLAN FOR NEXT SESSION: Nustep, manual therapy, isometrics, lumbar and hip mobility. Progress away from passive modalities as able. Did she buy and bring personal TENS unit? If so show her how to safely use this   Nedra Hai PT DPT PN2

## 2022-10-05 ENCOUNTER — Inpatient Hospital Stay: Payer: 59

## 2022-10-05 VITALS — BP 111/67 | HR 95 | Temp 98.0°F | Resp 20

## 2022-10-05 DIAGNOSIS — D509 Iron deficiency anemia, unspecified: Secondary | ICD-10-CM | POA: Diagnosis not present

## 2022-10-05 DIAGNOSIS — D62 Acute posthemorrhagic anemia: Secondary | ICD-10-CM

## 2022-10-05 DIAGNOSIS — D500 Iron deficiency anemia secondary to blood loss (chronic): Secondary | ICD-10-CM

## 2022-10-05 MED ORDER — SODIUM CHLORIDE 0.9 % IV SOLN
510.0000 mg | Freq: Once | INTRAVENOUS | Status: AC
  Administered 2022-10-05: 510 mg via INTRAVENOUS
  Filled 2022-10-05: qty 510

## 2022-10-05 MED ORDER — CETIRIZINE HCL 10 MG PO TABS
10.0000 mg | ORAL_TABLET | Freq: Once | ORAL | Status: AC
  Administered 2022-10-05: 10 mg via ORAL
  Filled 2022-10-05: qty 1

## 2022-10-05 MED ORDER — SODIUM CHLORIDE 0.9 % IV SOLN: Freq: Once | INTRAVENOUS | Status: AC

## 2022-10-05 MED ORDER — FAMOTIDINE IN NACL 20-0.9 MG/50ML-% IV SOLN
20.0000 mg | Freq: Once | INTRAVENOUS | Status: AC
  Administered 2022-10-05: 20 mg via INTRAVENOUS
  Filled 2022-10-05: qty 50

## 2022-10-05 MED ORDER — METHYLPREDNISOLONE SODIUM SUCC 125 MG IJ SOLR
125.0000 mg | Freq: Once | INTRAMUSCULAR | Status: AC
  Administered 2022-10-05: 125 mg via INTRAVENOUS
  Filled 2022-10-05: qty 2

## 2022-10-05 NOTE — Progress Notes (Signed)
Feraheme infusion given per orders. Patient tolerated it well without problems. Vitals stable and discharged home from clinic ambulatory. Follow up as scheduled.  

## 2022-10-05 NOTE — Patient Instructions (Signed)
[facility] [place]  Discharge Instructions: Thank you for choosing [facility] to provide your oncology and hematology care.  If you have a lab appointment with the Cancer Center - please note that after April 8th, 2024, all labs will be drawn in the cancer center.  You do not have to check in or register with the main entrance as you have in the past but will complete your check-in in the cancer center.  Wear comfortable clothing and clothing appropriate for easy access to any Portacath or PICC line.   We strive to give you quality time with your provider. You may need to reschedule your appointment if you arrive late (15 or more minutes).  Arriving late affects you and other patients whose appointments are after yours.  Also, if you miss three or more appointments without notifying the office, you may be dismissed from the clinic at the provider's discretion.      For prescription refill requests, have your pharmacy contact our office and allow 72 hours for refills to be completed.    Today you received the following iron infusion   To help prevent nausea and vomiting after your treatment, we encourage you to take your nausea medication as directed.  BELOW ARE SYMPTOMS THAT SHOULD BE REPORTED IMMEDIATELY: *FEVER GREATER THAN 100.4 F (38 C) OR HIGHER *CHILLS OR SWEATING *NAUSEA AND VOMITING THAT IS NOT CONTROLLED WITH YOUR NAUSEA MEDICATION *UNUSUAL SHORTNESS OF BREATH *UNUSUAL BRUISING OR BLEEDING *URINARY PROBLEMS (pain or burning when urinating, or frequent urination) *BOWEL PROBLEMS (unusual diarrhea, constipation, pain near the anus) TENDERNESS IN MOUTH AND THROAT WITH OR WITHOUT PRESENCE OF ULCERS (sore throat, sores in mouth, or a toothache) UNUSUAL RASH, SWELLING OR PAIN  UNUSUAL VAGINAL DISCHARGE OR ITCHING   Items with * indicate a potential emergency and should be followed up as soon as possible or go to the Emergency Department if any problems  should occur.  Please show the CHEMOTHERAPY ALERT CARD or IMMUNOTHERAPY ALERT CARD at check-in to the Emergency Department and triage nurse.  Should you have questions after your visit or need to cancel or reschedule your appointment, please contact [facility] [phone]  and follow the prompts.  Office hours are 8:00 a.m. to 4:30 p.m. Monday - Friday. Please note that voicemails left after 4:00 p.m. may not be returned until the following business day.  We are closed weekends and major holidays. You have access to a nurse at all times for urgent questions. Please call the main number to the clinic [phone] and follow the prompts.  For any non-urgent questions, you may also contact your provider using MyChart. We now offer e-Visits for anyone 18 and older to request care online for non-urgent symptoms. For details visit [email or web address].   Also download the MyChart app! Go to the app store, search "MyChart", open the app, select [place], and log in with your MyChart username and password.   

## 2022-10-10 ENCOUNTER — Ambulatory Visit: Payer: 59 | Admitting: *Deleted

## 2022-10-10 DIAGNOSIS — M6281 Muscle weakness (generalized): Secondary | ICD-10-CM | POA: Diagnosis not present

## 2022-10-10 DIAGNOSIS — Z9181 History of falling: Secondary | ICD-10-CM

## 2022-10-10 DIAGNOSIS — M5459 Other low back pain: Secondary | ICD-10-CM

## 2022-10-10 DIAGNOSIS — M546 Pain in thoracic spine: Secondary | ICD-10-CM | POA: Diagnosis not present

## 2022-10-10 NOTE — Therapy (Signed)
OUTPATIENT PHYSICAL THERAPY THORACOLUMBAR TREATMENT   Patient Name: Brittany Durham MRN: [id number] DOB:[birth year], 52 y.o., female Today's Date: 10/10/2022  END OF SESSION:  PT End of Session - 10/10/22 1211     Visit Number 5    Number of Visits 8    Date for PT Re-Evaluation 11/09/22    PT Start Time 0955    PT Stop Time 1030    PT Time Calculation (min) 35 min             Past Medical History:  Diagnosis Date   Anemia    Asthma    childhood   Carotidynia    COPD (chronic obstructive pulmonary disease) (HCC)    DDD (degenerative disc disease), cervical    GERD (gastroesophageal reflux disease)    Hypertension    Knee pain    left meniscus tear s/p repair 11/2014   Seizures (HCC)    last seizure was over 10 years ago, unknown etiology   Stuttering    patient reports this started after her knee surgery in 11/2014.    Thyroid nodule    Past Surgical History:  Procedure Laterality Date   ANTERIOR CERVICAL DECOMP/DISCECTOMY FUSION N/A 03/20/2019   Procedure: Anterior Cervical Decompression Fusion - Cervical Four-Cervical Five - Cervical Five-Cervical Six;  Surgeon: Tia Alert, MD;  Location: [facility] OR;  Service: Neurosurgery;  Laterality: N/A;  Anterior Cervical Decompression Fusion - Cervical Four-Cervical Five - Cervical Five-Cervical Six   BIOPSY  09/20/2020   Procedure: BIOPSY;  Surgeon: Corbin Ade, MD;  [facility];  Service: Endoscopy;;   COLONOSCOPY N/A 08/23/2016   Procedure: COLONOSCOPY;  Surgeon: Corbin Ade, MD;  [facility];  Service: Endoscopy;  Laterality: N/A;  1:00pm   COLONOSCOPY N/A 08/24/2020   Diverticulosis in the entire examined colon. Redundant colon. Exam otherwise normal   ESOPHAGOGASTRODUODENOSCOPY N/A 05/03/2016   Procedure: ESOPHAGOGASTRODUODENOSCOPY (EGD);  Surgeon: Corbin Ade, MD;  [facility];  Service: Endoscopy;  Laterality: N/A;   ESOPHAGOGASTRODUODENOSCOPY N/A 08/23/2016   Procedure:  ESOPHAGOGASTRODUODENOSCOPY (EGD);  Surgeon: Corbin Ade, MD;  [facility];  Service: Endoscopy;  Laterality: N/A;   ESOPHAGOGASTRODUODENOSCOPY (EGD) WITH PROPOFOL N/A 09/20/2020   normal esophagus. Large hiatal hernia. Multiple gastric erosions (Cameron lesions). Normal duodenal bulb and second portion of the duodenum. Suspicion Sheria Lang lesions causing intermittent bleeding   KNEE ARTHROSCOPY Right    KNEE ARTHROSCOPY WITH MEDIAL MENISECTOMY Left 12/09/2014   Procedure: KNEE ARTHROSCOPY WITH PARTIAL MEDIAL AND LATERAL MENISECTOMY;  Surgeon: Darreld Mclean, MD;  Location: [facility];  Service: Orthopedics;  Laterality: Left;   TUBAL LIGATION     WISDOM TOOTH EXTRACTION     Patient Active Problem List   Diagnosis Date Noted   Biliary dyskinesia 02/01/2021   Right flank pain 10/27/2020   Iron deficiency anemia due to chronic blood loss 09/15/2020   Symptomatic anemia 07/20/2020   Depression, major, single episode, moderate (HCC) 07/15/2020   Vertigo 01/26/2020   GAD (generalized anxiety disorder) 07/28/2019   Radiculopathy, cervical region 07/16/2019   S/P cervical spinal fusion 03/20/2019   Osteoarthritis 11/18/2017   Insomnia 11/18/2017   Synovitis, villonodular, knee, right 10/28/2017   Tear of lateral meniscus of right knee, current 10/28/2017   Chondromalacia patellae, 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   Language difficulty 09/25/2016   Nausea with vomiting 07/18/2016   PUD (peptic ulcer disease) 07/18/2016   Slurred speech    GI bleed 05/02/2016   Sinus tachycardia 05/02/2016  Acute blood loss anemia 05/02/2016   Speech disorder 03/31/2015   Thyroid nodule 12/14/2014   Left knee pain 12/14/2014   Seizures (HCC)    Vitamin D deficiency 06/30/2014   Gastroesophageal reflux disease without esophagitis 06/29/2014   SYNCOPE 03/02/2010   HYPERTENSION, BENIGN 03/01/2010   REFERRING PROVIDER: [name], FNP   REFERRING DIAG: Acute left-sided thoracic back pain    Rationale for Evaluation and Treatment: Rehabilitation  THERAPY DIAG:  Other low back pain  History of falling  Muscle weakness (generalized)  ONSET DATE: April 2024   SUBJECTIVE:                                                                                                                                                                                           SUBJECTIVE STATEMENT:  I got my days mixed up and I'm really hurting today. The pain isn't letting up. 10/10.   PERTINENT HISTORY:  HTN, OA, history of seizures, anxiety, depression, asthma, and COPD  PAIN:  Are you having pain? Yes: NPRS scale: 10/10 Pain location: Left low back but is moving to the center  Pain description: stabbing pain Aggravating factors: everything makes it hurt, "doesn't matter"  Relieving factors: ice or heat   PRECAUTIONS: None  PATIENT GOALS: reduced pain, be abel to walk longer, and be able to sleep better  NEXT MD VISIT: 09/20/22 (Dr. Ophelia Charter)   OBJECTIVE:   DIAGNOSTIC FINDINGS: 08/31/22 Thoracic x-ray IMPRESSION: Negative.  SCREENING FOR RED FLAGS: Bowel or bladder incontinence: No Spinal tumors: No Cauda equina syndrome: No Compression fracture: No Abdominal aneurysm: No  COGNITION: Overall cognitive status: Within functional limits for tasks assessed     SENSATION: Patient reports no numbness or tingling  POSTURE: forward head  PALPATION: Familiar pain reproduced with palpation to: left lumbar paraspinals, QL, and latissimus dorsi  LUMBAR ROM:   AROM eval  Flexion 24; familiar pain  Extension 8; familiar pain  Right lateral flexion   Left lateral flexion   Right rotation 25% limited  Left rotation 25% limited; painful   (Blank rows = not tested)  LOWER EXTREMITY ROM: WFL for activities assessed  LOWER EXTREMITY MMT:    MMT Right eval Left eval  Hip flexion 4+/5; low back pain  4+/5  Hip extension    Hip abduction    Hip adduction    Hip internal  rotation    Hip external rotation    Knee flexion 4+/5 4+//5  Knee extension 4+/5 5/5  Ankle dorsiflexion 4/5 4/5  Ankle plantarflexion    Ankle inversion    Ankle eversion     (Blank rows =  not tested)  LUMBAR SPECIAL TESTS:  Unable to be assessed due to pain severity and irritability  GAIT: Assistive device utilized: None Level of assistance: Complete Independence Comments: decreased gait speed and stride length  TODAY'S TREATMENT:                                                                                                                              DATE:   10-10-22 Manual STW/ TPR TO BIL LB paras with Pt in RT side lying IFC x15 mins 80-150hz  to LB paras in RT side lying with HMP         10/04/22  TherEx  Nustep L4 x6 minutes BUEs/BLEs  SKTC 5x5 seconds B  TA sets x15 with 3 second holds  TA set + lumbar rotation 5x5 seconds B PPT x10 with 3 second holds Bridges through partial range x10  Figure 4 piriformis stretch 2x30 seconds B       PATIENT EDUCATION:  Education details: POC, healing, prognosis, anatomy, and goals for therapy Person educated: Patient Education method: Explanation Education comprehension: verbalized understanding  HOME EXERCISE PROGRAM:  Access Code: [email or web address] URL: [email or web address] Date: 10/04/2022 Prepared by: Nedra Hai  Exercises - Supine Transversus Abdominis Bracing - Hands on Stomach  - 3 x daily - 7 x weekly - 1 sets - 10 reps - 3 hold - Supine Single Knee to Chest  - 3 x daily - 7 x weekly - 1 sets - 10 reps - 3 hold - Supine Lower Trunk Rotation  - 3 x daily - 7 x weekly - 1 sets - 10 reps - 3 hold - Supine Posterior Pelvic Tilt  - 3 x daily - 7 x weekly - 1 sets - 10 reps - 3 hold  ASSESSMENT:  CLINICAL IMPRESSION:  Pt arrives late today due to appt time mixup. X focused on pain reduction due to high pain level 10/10. STW/ TPR performed f/b HMP and IFC to LB paras. Reports decreased pain end  of session. Shortened Rx due to being late.      OBJECTIVE IMPAIRMENTS: Abnormal gait, decreased activity tolerance, decreased mobility, difficulty walking, decreased ROM, decreased strength, hypomobility, impaired tone, postural dysfunction, and pain.   ACTIVITY LIMITATIONS: carrying, lifting, bending, sitting, standing, sleeping, stairs, transfers, bed mobility, locomotion level, and caring for others  PARTICIPATION LIMITATIONS: meal prep, cleaning, laundry, shopping, and community activity  PERSONAL FACTORS: Time since onset of injury/illness/exacerbation and 3+ comorbidities: HTN, OA, history of seizures, anxiety, depression, asthma, and COPD  are also affecting patient's functional outcome.   REHAB POTENTIAL: Fair    CLINICAL DECISION MAKING: Unstable/unpredictable  EVALUATION COMPLEXITY: High   GOALS: Goals reviewed with patient? Yes  LONG TERM GOALS: Target date: 10/17/22  Patient will be independent with her HEP.  Baseline:  Goal status: INITIAL  2.  Patient will be able to complete her daily activities without her familiar pain exceeding 8/10.  Baseline:  Goal status: INITIAL  3.  Patient will be able to demonstrate at least 30 degrees of lumbar flexion for improved function picking items up from the floor.  Baseline:  Goal status: INITIAL  4.  Patient will report being able to play with her grandchildren without being limited by her familiar low back pain.  Baseline:  Goal status: INITIAL  5.  Patient will report being able to sleep at least 6 hours without being awakened by her familiar low back pain.  Baseline:  Goal status: INITIAL  PLAN:  PT FREQUENCY: 2x/week  PT DURATION: 4 weeks  PLANNED INTERVENTIONS: Therapeutic exercises, Therapeutic activity, Neuromuscular re-education, Gait training, Patient/Family education, Self Care, Joint mobilization, Stair training, Dry Needling, Electrical stimulation, Spinal mobilization, Cryotherapy, Moist heat,  Ultrasound, Manual therapy, and Re-evaluation.  PLAN FOR NEXT SESSION: Nustep, manual therapy, isometrics, lumbar and hip mobility. Progress away from passive modalities as able. Did she buy and bring personal TENS unit? If so show her how to safely use this   Nedra Hai PT DPT PN2

## 2022-10-11 ENCOUNTER — Ambulatory Visit: Payer: 59

## 2022-10-15 ENCOUNTER — Telehealth: Payer: Self-pay | Admitting: Cardiology

## 2022-10-15 NOTE — Telephone Encounter (Signed)
Spoke with patient and daughter Patient has been having chest pains since yesterday, middle of chest No belching  Denies shortness of breath, radiating pain, or swelling but does have some nausea Took tylenol but didn't help Pain is 8-9 out of 10  Advised patient and daughter recommendations for active chest pains is ED but they would like Dr Di Kindle recommendations as well  Will discuss with Dr Cristal Deer and call daughter back

## 2022-10-15 NOTE — Telephone Encounter (Signed)
Discussed with Dr Cristal Deer who suggested ED   Advised daughter, verbalized understanding

## 2022-10-15 NOTE — Telephone Encounter (Signed)
Pt c/o of Chest Pain: STAT if CP now or developed within 24 hours  1. Are you having CP right now? Yes   2. Are you experiencing any other symptoms (ex. SOB, nausea, vomiting, sweating)? No   3. How long have you been experiencing CP? Yesterday   4. Is your CP continuous or coming and going? Coming and going  5. Have you taken Nitroglycerin? No  ?  

## 2022-10-16 ENCOUNTER — Ambulatory Visit: Payer: 59 | Attending: Family

## 2022-10-16 DIAGNOSIS — M6281 Muscle weakness (generalized): Secondary | ICD-10-CM | POA: Diagnosis not present

## 2022-10-16 DIAGNOSIS — Z9181 History of falling: Secondary | ICD-10-CM | POA: Insufficient documentation

## 2022-10-16 DIAGNOSIS — M5459 Other low back pain: Secondary | ICD-10-CM | POA: Diagnosis not present

## 2022-10-16 NOTE — Therapy (Signed)
OUTPATIENT PHYSICAL THERAPY THORACOLUMBAR TREATMENT   Patient Name: Brittany Durham MRN: [id number] DOB:1970/07/05, 52 y.o., female Today's Date: 10/16/2022  END OF SESSION:  PT End of Session - 10/16/22 1018     Visit Number 6    Number of Visits 8    Date for PT Re-Evaluation 11/09/22    PT Start Time 1015    PT Stop Time 1115    PT Time Calculation (min) 60 min    Activity Tolerance Patient tolerated treatment well    Behavior During Therapy [facility] for tasks assessed/performed             Past Medical History:  Diagnosis Date   Anemia    Asthma    childhood   Carotidynia    COPD (chronic obstructive pulmonary disease) (HCC)    DDD (degenerative disc disease), cervical    GERD (gastroesophageal reflux disease)    Hypertension    Knee pain    left meniscus tear s/p repair 11/2014   Seizures (HCC)    last seizure was over 10 years ago, unknown etiology   Stuttering    patient reports this started after her knee surgery in 11/2014.    Thyroid nodule    Past Surgical History:  Procedure Laterality Date   ANTERIOR CERVICAL DECOMP/DISCECTOMY FUSION N/A 03/20/2019   Procedure: Anterior Cervical Decompression Fusion - Cervical Four-Cervical Five - Cervical Five-Cervical Six;  Surgeon: Tia Alert, MD;  Location: [facility] OR;  Service: Neurosurgery;  Laterality: N/A;  Anterior Cervical Decompression Fusion - Cervical Four-Cervical Five - Cervical Five-Cervical Six   BIOPSY  09/20/2020   Procedure: BIOPSY;  Surgeon: Corbin Ade, MD;  Location: [facility];  Service: Endoscopy;;   COLONOSCOPY N/A 08/23/2016   Procedure: COLONOSCOPY;  Surgeon: Corbin Ade, MD;  Location: [facility];  Service: Endoscopy;  Laterality: N/A;  1:00pm   COLONOSCOPY N/A 08/24/2020   Diverticulosis in the entire examined colon. Redundant colon. Exam otherwise normal   ESOPHAGOGASTRODUODENOSCOPY N/A 05/03/2016   Procedure: ESOPHAGOGASTRODUODENOSCOPY (EGD);  Surgeon: Corbin Ade, MD;  Location:  [facility];  Service: Endoscopy;  Laterality: N/A;   ESOPHAGOGASTRODUODENOSCOPY N/A 08/23/2016   Procedure: ESOPHAGOGASTRODUODENOSCOPY (EGD);  Surgeon: Corbin Ade, MD;  Location: [facility];  Service: Endoscopy;  Laterality: N/A;   ESOPHAGOGASTRODUODENOSCOPY (EGD) WITH PROPOFOL N/A 09/20/2020   normal esophagus. Large hiatal hernia. Multiple gastric erosions (Cameron lesions). Normal duodenal bulb and second portion of the duodenum. Suspicion Sheria Lang lesions causing intermittent bleeding   KNEE ARTHROSCOPY Right    KNEE ARTHROSCOPY WITH MEDIAL MENISECTOMY Left 12/09/2014   Procedure: KNEE ARTHROSCOPY WITH PARTIAL MEDIAL AND LATERAL MENISECTOMY;  Surgeon: Darreld Mclean, MD;  Location: [facility]S;  Service: Orthopedics;  Laterality: Left;   TUBAL LIGATION     WISDOM TOOTH EXTRACTION     Patient Active Problem List   Diagnosis Date Noted   Biliary dyskinesia 02/01/2021   Right flank pain 10/27/2020   Iron deficiency anemia due to chronic blood loss 09/15/2020   Symptomatic anemia 07/20/2020   Depression, major, single episode, moderate (HCC) 07/15/2020   Vertigo 01/26/2020   GAD (generalized anxiety disorder) 07/28/2019   Radiculopathy, cervical region 07/16/2019   S/P cervical spinal fusion 03/20/2019   Osteoarthritis 11/18/2017   Insomnia 11/18/2017   Synovitis, villonodular, knee, right 10/28/2017   Tear of lateral meniscus of right knee, current 10/28/2017   Chondromalacia patellae, 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   Language difficulty 09/25/2016   Nausea with vomiting 07/18/2016   PUD (  peptic ulcer disease) 07/18/2016   Slurred speech    GI bleed 05/02/2016   Sinus tachycardia 05/02/2016   Acute blood loss anemia 05/02/2016   Speech disorder 03/31/2015   Thyroid nodule 12/14/2014   Left knee pain 12/14/2014   Seizures (HCC)    Vitamin D deficiency 06/30/2014   Gastroesophageal reflux disease without esophagitis 06/29/2014   SYNCOPE 03/02/2010   HYPERTENSION, BENIGN 03/01/2010    REFERRING PROVIDER: [name], FNP   REFERRING DIAG: Acute left-sided thoracic back pain   Rationale for Evaluation and Treatment: Rehabilitation  THERAPY DIAG:  Other low back pain  ONSET DATE: April 2024   SUBJECTIVE:                                                                                                                                                                                           SUBJECTIVE STATEMENT: Patient reports that she is still hurting today, but not as bad as it was at her last appointment.   PERTINENT HISTORY:  HTN, OA, history of seizures, anxiety, depression, asthma, and COPD  PAIN:  Are you having pain? Yes: NPRS scale: 7/10 Pain location: Left low back but is moving to the center  Pain description: stabbing pain Aggravating factors: everything makes it hurt, "doesn't matter"  Relieving factors: ice or heat   PRECAUTIONS: None  PATIENT GOALS: reduced pain, be abel to walk longer, and be able to sleep better  NEXT MD VISIT: 09/20/22 (Dr. Ophelia Charter)   OBJECTIVE:   DIAGNOSTIC FINDINGS: 08/31/22 Thoracic x-ray IMPRESSION: Negative.  SCREENING FOR RED FLAGS: Bowel or bladder incontinence: No Spinal tumors: No Cauda equina syndrome: No Compression fracture: No Abdominal aneurysm: No  COGNITION: Overall cognitive status: Within functional limits for tasks assessed     SENSATION: Patient reports no numbness or tingling  POSTURE: forward head  PALPATION: Familiar pain reproduced with palpation to: left lumbar paraspinals, QL, and latissimus dorsi  LUMBAR ROM:   AROM eval  Flexion 24; familiar pain  Extension 8; familiar pain  Right lateral flexion   Left lateral flexion   Right rotation 25% limited  Left rotation 25% limited; painful   (Blank rows = not tested)  LOWER EXTREMITY ROM: WFL for activities assessed  LOWER EXTREMITY MMT:    MMT Right eval Left eval  Hip flexion 4+/5; low back pain  4+/5  Hip extension     Hip abduction    Hip adduction    Hip internal rotation    Hip external rotation    Knee flexion 4+/5 4+//5  Knee extension 4+/5 5/5  Ankle dorsiflexion 4/5 4/5  Ankle plantarflexion  Ankle inversion    Ankle eversion     (Blank rows = not tested)  LUMBAR SPECIAL TESTS:  Unable to be assessed due to pain severity and irritability  GAIT: Assistive device utilized: None Level of assistance: Complete Independence Comments: decreased gait speed and stride length  TODAY'S TREATMENT:                                                                                                                              DATE:                                    10/16/22 EXERCISE LOG  Exercise Repetitions and Resistance Comments  Nustep  L3 x 15 minutes   Resisted pull down  Blue XTS x 20 reps    Slouch overcorrect  2 minutes Added to HEP   Isometric ball press 3 minutes w/ 5 second hold  Added to HEP   Seated lumbar flexion w/ rotation  3 minutes  Alternating sides; added to HEP   Seated hip ADD isometric 3 minutes w/ 5 second hold     Blank cell = exercise not performed today  Modalities  Date:  Unattended Estim: Lumbar, IFC @ 80-150 Hz w/ 40% scan, 15 mins, Pain and Tone Hot Pack: Lumbar, 15 mins, Pain and Tone  PATIENT EDUCATION:  Education details: POC, healing, prognosis, anatomy, and goals for therapy Person educated: Patient Education method: Explanation Education comprehension: verbalized understanding  HOME EXERCISE PROGRAM: [id number]  ASSESSMENT:  CLINICAL IMPRESSION: Patient was introduced to multiple new interventions for reduced low back pain with excellent results. She required minimal cueing with resisted pull downs for lumbar stability to prevent trunk flexion with this movement. She reported feeling good as she was not hurting at all upon the conclusion of treatment. She continues to require skilled physical therapy to address her remaining impairments to return to  her prior level of function.   OBJECTIVE IMPAIRMENTS: Abnormal gait, decreased activity tolerance, decreased mobility, difficulty walking, decreased ROM, decreased strength, hypomobility, impaired tone, postural dysfunction, and pain.   ACTIVITY LIMITATIONS: carrying, lifting, bending, sitting, standing, sleeping, stairs, transfers, bed mobility, locomotion level, and caring for others  PARTICIPATION LIMITATIONS: meal prep, cleaning, laundry, shopping, and community activity  PERSONAL FACTORS: Time since onset of injury/illness/exacerbation and 3+ comorbidities: HTN, OA, history of seizures, anxiety, depression, asthma, and COPD  are also affecting patient's functional outcome.   REHAB POTENTIAL: Fair    CLINICAL DECISION MAKING: Unstable/unpredictable  EVALUATION COMPLEXITY: High   GOALS: Goals reviewed with patient? Yes  LONG TERM GOALS: Target date: 10/17/22  Patient will be independent with her HEP.  Baseline:  Goal status: INITIAL  2.  Patient will be able to complete her daily activities without her familiar pain exceeding 8/10.  Baseline:  Goal status: INITIAL  3.  Patient will be able to demonstrate at least  30 degrees of lumbar flexion for improved function picking items up from the floor.  Baseline:  Goal status: INITIAL  4.  Patient will report being able to play with her grandchildren without being limited by her familiar low back pain.  Baseline:  Goal status: INITIAL  5.  Patient will report being able to sleep at least 6 hours without being awakened by her familiar low back pain.  Baseline:  Goal status: INITIAL  PLAN:  PT FREQUENCY: 2x/week  PT DURATION: 4 weeks  PLANNED INTERVENTIONS: Therapeutic exercises, Therapeutic activity, Neuromuscular re-education, Gait training, Patient/Family education, Self Care, Joint mobilization, Stair training, Dry Needling, Electrical stimulation, Spinal mobilization, Cryotherapy, Moist heat, Ultrasound, Manual therapy,  and Re-evaluation.  PLAN FOR NEXT SESSION: Nustep, manual therapy, isometrics, lumbar and hip mobility. Progress away from passive modalities as able. Did she buy and bring personal TENS unit? If so show her how to safely use this   Candi Leash, PT, DPT

## 2022-10-23 ENCOUNTER — Encounter: Payer: Self-pay | Admitting: Physical Therapy

## 2022-10-23 ENCOUNTER — Ambulatory Visit: Payer: 59 | Admitting: Physical Therapy

## 2022-10-23 DIAGNOSIS — M5459 Other low back pain: Secondary | ICD-10-CM

## 2022-10-23 DIAGNOSIS — Z9181 History of falling: Secondary | ICD-10-CM | POA: Diagnosis not present

## 2022-10-23 DIAGNOSIS — M6281 Muscle weakness (generalized): Secondary | ICD-10-CM

## 2022-10-23 NOTE — Therapy (Addendum)
 OUTPATIENT PHYSICAL THERAPY THORACOLUMBAR TREATMENT   Patient Name: Brittany Durham MRN: [id number] DOB:May 04, 1971, 52 y.o., female Today's Date: 10/23/2022  END OF SESSION:  PT End of Session - 10/23/22 1006     Visit Number 7    Number of Visits 8    Date for PT Re-Evaluation 11/09/22    PT Start Time 1005    PT Stop Time 1057    PT Time Calculation (min) 52 min    Activity Tolerance Patient tolerated treatment well    Behavior During Therapy WFL for tasks assessed/performed            Past Medical History:  Diagnosis Date   Anemia    Asthma    childhood   Carotidynia    COPD (chronic obstructive pulmonary disease) (HCC)    DDD (degenerative disc disease), cervical    GERD (gastroesophageal reflux disease)    Hypertension    Knee pain    left meniscus tear s/p repair 11/2014   Seizures (HCC)    last seizure was over 10 years ago, unknown etiology   Stuttering    patient reports this started after her knee surgery in 11/2014.    Thyroid nodule    Past Surgical History:  Procedure Laterality Date   ANTERIOR CERVICAL DECOMP/DISCECTOMY FUSION N/A 03/20/2019   Procedure: Anterior Cervical Decompression Fusion - Cervical Four-Cervical Five - Cervical Five-Cervical Six;  Surgeon: Tia Alert, MD;  Location: [facility] OR;  Service: Neurosurgery;  Laterality: N/A;  Anterior Cervical Decompression Fusion - Cervical Four-Cervical Five - Cervical Five-Cervical Six   BIOPSY  09/20/2020   Procedure: BIOPSY;  Surgeon: Corbin Ade, MD;  Location: [facility];  Service: Endoscopy;;   COLONOSCOPY N/A 08/23/2016   Procedure: COLONOSCOPY;  Surgeon: Corbin Ade, MD;  Location: [facility];  Service: Endoscopy;  Laterality: N/A;  1:00pm   COLONOSCOPY N/A 08/24/2020   Diverticulosis in the entire examined colon. Redundant colon. Exam otherwise normal   ESOPHAGOGASTRODUODENOSCOPY N/A 05/03/2016   Procedure: ESOPHAGOGASTRODUODENOSCOPY (EGD);  Surgeon: Corbin Ade, MD;  Location: [facility];  Service: Endoscopy;  Laterality: N/A;   ESOPHAGOGASTRODUODENOSCOPY N/A 08/23/2016   Procedure: ESOPHAGOGASTRODUODENOSCOPY (EGD);  Surgeon: Corbin Ade, MD;  Location: [facility];  Service: Endoscopy;  Laterality: N/A;   ESOPHAGOGASTRODUODENOSCOPY (EGD) WITH PROPOFOL N/A 09/20/2020   normal esophagus. Large hiatal hernia. Multiple gastric erosions (Cameron lesions). Normal duodenal bulb and second portion of the duodenum. Suspicion Sheria Lang lesions causing intermittent bleeding   KNEE ARTHROSCOPY Right    KNEE ARTHROSCOPY WITH MEDIAL MENISECTOMY Left 12/09/2014   Procedure: KNEE ARTHROSCOPY WITH PARTIAL MEDIAL AND LATERAL MENISECTOMY;  Surgeon: Darreld Mclean, MD;  Location: [facility];  Service: Orthopedics;  Laterality: Left;   TUBAL LIGATION     WISDOM TOOTH EXTRACTION     Patient Active Problem List   Diagnosis Date Noted   Biliary dyskinesia 02/01/2021   Right flank pain 10/27/2020   Iron deficiency anemia due to chronic blood loss 09/15/2020   Symptomatic anemia 07/20/2020   Depression, major, single episode, moderate (HCC) 07/15/2020   Vertigo 01/26/2020   GAD (generalized anxiety disorder) 07/28/2019   Radiculopathy, cervical region 07/16/2019   S/P cervical spinal fusion 03/20/2019   Osteoarthritis 11/18/2017   Insomnia 11/18/2017   Synovitis, villonodular, knee, right 10/28/2017   Tear of lateral meniscus of right knee, current 10/28/2017   Chondromalacia patellae, 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   Language difficulty 09/25/2016   Nausea with vomiting 07/18/2016   PUD (peptic  ulcer disease) 07/18/2016   Slurred speech    GI bleed 05/02/2016   Sinus tachycardia 05/02/2016   Acute blood loss anemia 05/02/2016   Speech disorder 03/31/2015   Thyroid nodule 12/14/2014   Left knee pain 12/14/2014   Seizures (HCC)    Vitamin D deficiency 06/30/2014   Gastroesophageal reflux disease without esophagitis 06/29/2014   SYNCOPE 03/02/2010   HYPERTENSION, BENIGN 03/01/2010    REFERRING PROVIDER: [name], FNP   REFERRING DIAG: Acute left-sided thoracic back pain   Rationale for Evaluation and Treatment: Rehabilitation  THERAPY DIAG:  Other low back pain  History of falling  Muscle weakness (generalized)  ONSET DATE: April 2024   SUBJECTIVE:                                                                                                                                                                                           SUBJECTIVE STATEMENT: Patient reports that she is still hurting today. Goes MD next week.  PERTINENT HISTORY:  HTN, OA, history of seizures, anxiety, depression, asthma, and COPD  PAIN:  Are you having pain? Yes: NPRS scale: 8/10 Pain location: Left low back but is moving to the center  Pain description: stabbing pain Aggravating factors: everything makes it hurt, "doesn't matter"  Relieving factors: ice or heat   PRECAUTIONS: None  PATIENT GOALS: reduced pain, be abel to walk longer, and be able to sleep better  NEXT MD VISIT: 09/20/22 (Dr. Ophelia Charter)   OBJECTIVE:   DIAGNOSTIC FINDINGS: 08/31/22 Thoracic x-ray IMPRESSION: Negative.  SCREENING FOR RED FLAGS: Bowel or bladder incontinence: No Spinal tumors: No Cauda equina syndrome: No Compression fracture: No Abdominal aneurysm: No  COGNITION: Overall cognitive status: Within functional limits for tasks assessed     SENSATION: Patient reports no numbness or tingling  POSTURE: forward head  PALPATION: Familiar pain reproduced with palpation to: left lumbar paraspinals, QL, and latissimus dorsi  LUMBAR ROM:   AROM eval  Flexion 24; familiar pain  Extension 8; familiar pain  Right lateral flexion   Left lateral flexion   Right rotation 25% limited  Left rotation 25% limited; painful   (Blank rows = not tested)  LOWER EXTREMITY ROM: WFL for activities assessed  LOWER EXTREMITY MMT:    MMT Right eval Left eval  Hip flexion 4+/5; low back pain   4+/5  Hip extension    Hip abduction    Hip adduction    Hip internal rotation    Hip external rotation    Knee flexion 4+/5 4+//5  Knee extension 4+/5 5/5  Ankle dorsiflexion 4/5 4/5  Ankle plantarflexion  Ankle inversion    Ankle eversion     (Blank rows = not tested)  LUMBAR SPECIAL TESTS:  Unable to be assessed due to pain severity and irritability  GAIT: Assistive device utilized: None Level of assistance: Complete Independence Comments: decreased gait speed and stride length  TODAY'S TREATMENT:                                                                                                                              DATE: 10/23/22 EXERCISE LOG  Exercise Repetitions and Resistance Comments  Nustep  L3 x 20 minutes   Resisted pull down  Blue XTS x 20 reps    Bridge X30 reps 5 sec holds   Marching  X30 reps            Blank cell = exercise not performed today   Modalities  Date: 10/23/22 Unattended Estim: Lumbar, Pre-Mod, 10 mins, Pain  PATIENT EDUCATION:  Education details: POC, healing, prognosis, anatomy, and goals for therapy Person educated: Patient Education method: Explanation Education comprehension: verbalized understanding  HOME EXERCISE PROGRAM: [id number]  ASSESSMENT:  CLINICAL IMPRESSION: Patient presented in clinic with reports of increased LBP. Patient able to tolerate therex with increased reps and no complaints of any increased pain. Patient reports having to sit for rest with ADL such as laundry. Normal stimulation response noted following removal of the modality.  PHYSICAL THERAPY DISCHARGE SUMMARY  Visits from Start of Care: 7  Current functional level related to goals / functional outcomes: Patient was unable to meet her goals for skilled physical therapy as she did not complete her recommended plan of care.    Remaining deficits: Lumbar AROM and pain    Education / Equipment: HEP    Patient agrees to discharge. Patient goals  were not met. Patient is being discharged due to not returning since the last visit.  Candi Leash, PT, DPT    OBJECTIVE IMPAIRMENTS: Abnormal gait, decreased activity tolerance, decreased mobility, difficulty walking, decreased ROM, decreased strength, hypomobility, impaired tone, postural dysfunction, and pain.   ACTIVITY LIMITATIONS: carrying, lifting, bending, sitting, standing, sleeping, stairs, transfers, bed mobility, locomotion level, and caring for others  PARTICIPATION LIMITATIONS: meal prep, cleaning, laundry, shopping, and community activity  PERSONAL FACTORS: Time since onset of injury/illness/exacerbation and 3+ comorbidities: HTN, OA, history of seizures, anxiety, depression, asthma, and COPD  are also affecting patient's functional outcome.   REHAB POTENTIAL: Fair    CLINICAL DECISION MAKING: Unstable/unpredictable  EVALUATION COMPLEXITY: High   GOALS: Goals reviewed with patient? Yes  LONG TERM GOALS: Target date: 10/17/22  Patient will be independent with her HEP.  Baseline:  Goal status: INITIAL  2.  Patient will be able to complete her daily activities without her familiar pain exceeding 8/10.  Baseline:  Goal status: INITIAL  3.  Patient will be able to demonstrate at least 30 degrees of lumbar flexion for improved function picking items up from the floor.  Baseline:  Goal status: INITIAL  4.  Patient will report being able to play with her grandchildren without being limited by her familiar low back pain.  Baseline:  Goal status: INITIAL  5.  Patient will report being able to sleep at least 6 hours without being awakened by her familiar low back pain.  Baseline:  Goal status: INITIAL  PLAN:  PT FREQUENCY: 2x/week  PT DURATION: 4 weeks  PLANNED INTERVENTIONS: Therapeutic exercises, Therapeutic activity, Neuromuscular re-education, Gait training, Patient/Family education, Self Care, Joint mobilization, Stair training, Dry Needling, Electrical  stimulation, Spinal mobilization, Cryotherapy, Moist heat, Ultrasound, Manual therapy, and Re-evaluation.  PLAN FOR NEXT SESSION: Nustep, manual therapy, isometrics, lumbar and hip mobility. Progress away from passive modalities as able. Did she buy and bring personal TENS unit? If so show her how to safely use this   Candi Leash, PT, DPT

## 2022-10-29 ENCOUNTER — Ambulatory Visit: Payer: 59 | Admitting: *Deleted

## 2022-10-30 DIAGNOSIS — R079 Chest pain, unspecified: Secondary | ICD-10-CM | POA: Diagnosis not present

## 2022-10-30 DIAGNOSIS — K449 Diaphragmatic hernia without obstruction or gangrene: Secondary | ICD-10-CM | POA: Diagnosis not present

## 2022-10-30 DIAGNOSIS — K219 Gastro-esophageal reflux disease without esophagitis: Secondary | ICD-10-CM | POA: Diagnosis not present

## 2022-11-01 ENCOUNTER — Encounter: Payer: Self-pay | Admitting: Orthopaedic Surgery

## 2022-11-01 ENCOUNTER — Ambulatory Visit (INDEPENDENT_AMBULATORY_CARE_PROVIDER_SITE_OTHER): Payer: 59 | Admitting: Orthopaedic Surgery

## 2022-11-01 VITALS — Ht 67.0 in | Wt 208.0 lb

## 2022-11-01 DIAGNOSIS — M545 Low back pain, unspecified: Secondary | ICD-10-CM

## 2022-11-01 MED ORDER — DIAZEPAM 5 MG PO TABS: ORAL_TABLET | ORAL | 0 refills | Status: DC

## 2022-11-01 NOTE — Progress Notes (Signed)
Office Visit Note   Patient: Brittany Durham           Date of Birth: May 15, 1970           MRN: [id number] Visit Date: 11/01/2022              Requested by: [name], FNP [address] [place],  [place] [address] PCP: [name], FNP   Assessment & Plan: Visit Diagnoses:  1. Acute left-sided low back pain, unspecified whether sciatica present     Plan: Plain radiographs demonstrated some facet arthropathy L5-S1.  She has had persistent symptoms failed conservative treatment not responding to medications and multiple weeks physical therapy since last visit 09/20/2022.  Will seek authorization for MRI scan for evaluation for her severe pain that she is rating 10 out of 10.  Valium prescribed for claustrophobia.  Follow-up after MRI scan.  Follow-Up Instructions: No follow-ups on file.   Orders:  Orders Placed This Encounter  Procedures   MR Lumbar Spine w/o contrast   No orders of the defined types were placed in this encounter.     Procedures: No procedures performed   Clinical Data: No additional findings.   Subjective: Chief Complaint  Patient presents with   Lower Back - Pain, Follow-up    HPI 52 year old female returns with persistent problems with her back.  She has been through therapy without improvement states her pain is severe 10 out of 10.  She notes slight improvement with heat.  She has pain with sitting with standing worse with activities worse with twisting.  Radiates into both legs not all the way down to her feet consistently.  Patient mentions she does have claustrophobia.  Patient has expressive aphasia.  Review of Systems all other systems noncontributory HPI.   Objective: Vital Signs: Ht 5\' 7"  (1.702 m)   Wt 208 lb (94.3 kg)   LMP 09/16/2017   BMI 32.58 kg/m   Physical Exam Constitutional:      Appearance: She is well-developed.  HENT:     Head: Normocephalic.     Right Ear: External ear normal.     Left Ear: External ear  normal. There is no impacted cerumen.  Eyes:     Pupils: Pupils are equal, round, and reactive to light.  Neck:     Thyroid: No thyromegaly.     Trachea: No tracheal deviation.  Cardiovascular:     Rate and Rhythm: Normal rate.  Pulmonary:     Effort: Pulmonary effort is normal.  Abdominal:     Palpations: Abdomen is soft.  Musculoskeletal:     Cervical back: No rigidity.  Skin:    General: Skin is warm and dry.  Neurological:     Mental Status: She is alert and oriented to person, place, and time.  Psychiatric:        Behavior: Behavior normal.     Ortho Exam tenderness of the lumbar spine negative straight leg raising 90 degrees.  Pedal pulses are 2+ knee and ankle jerk are intact.  There is sciatic notch tenderness right left minimal trochanteric bursal tenderness.  Specialty Comments:  No specialty comments available.  Imaging: No results found.   PMFS History: Patient Active Problem List   Diagnosis Date Noted   Biliary dyskinesia 02/01/2021   Right flank pain 10/27/2020   Iron deficiency anemia due to chronic blood loss 09/15/2020   Symptomatic anemia 07/20/2020   Depression, major, single episode, moderate (HCC) 07/15/2020   Vertigo 01/26/2020  GAD (generalized anxiety disorder) 07/28/2019   Radiculopathy, cervical region 07/16/2019   S/P cervical spinal fusion 03/20/2019   Osteoarthritis 11/18/2017   Insomnia 11/18/2017   Synovitis, villonodular, knee, right 10/28/2017   Tear of lateral meniscus of right knee, current 10/28/2017   Chondromalacia patellae, 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   Language difficulty 09/25/2016   Nausea with vomiting 07/18/2016   PUD (peptic ulcer disease) 07/18/2016   Slurred speech    GI bleed 05/02/2016   Sinus tachycardia 05/02/2016   Acute blood loss anemia 05/02/2016   Speech disorder 03/31/2015   Thyroid nodule 12/14/2014   Left knee pain 12/14/2014   Seizures (HCC)    Vitamin D deficiency 06/30/2014   Gastroesophageal reflux  disease without esophagitis 06/29/2014   SYNCOPE 03/02/2010   HYPERTENSION, BENIGN 03/01/2010   Past Medical History:  Diagnosis Date   Anemia    Asthma    childhood   Carotidynia    COPD (chronic obstructive pulmonary disease) (HCC)    DDD (degenerative disc disease), cervical    GERD (gastroesophageal reflux disease)    Hypertension    Knee pain    left meniscus tear s/p repair 11/2014   Seizures (HCC)    last seizure was over 10 years ago, unknown etiology   Stuttering    patient reports this started after her knee surgery in 11/2014.    Thyroid nodule     Family History  Problem Relation Age of Onset   Hypertension Mother    Lung cancer Father    Hypertension Brother    Colon cancer Neg Hx    Gastric cancer Neg Hx    Esophageal cancer Neg Hx     Past Surgical History:  Procedure Laterality Date   ANTERIOR CERVICAL DECOMP/DISCECTOMY FUSION N/A 03/20/2019   Procedure: Anterior Cervical Decompression Fusion - Cervical Four-Cervical Five - Cervical Five-Cervical Six;  Surgeon: Tia Alert, MD;  Location: [facility] OR;  Service: Neurosurgery;  Laterality: N/A;  Anterior Cervical Decompression Fusion - Cervical Four-Cervical Five - Cervical Five-Cervical Six   BIOPSY  09/20/2020   Procedure: BIOPSY;  Surgeon: Corbin Ade, MD;  Location: [facility];  Service: Endoscopy;;   COLONOSCOPY N/A 08/23/2016   Procedure: COLONOSCOPY;  Surgeon: Corbin Ade, MD;  Location: [facility];  Service: Endoscopy;  Laterality: N/A;  1:00pm   COLONOSCOPY N/A 08/24/2020   Diverticulosis in the entire examined colon. Redundant colon. Exam otherwise normal   ESOPHAGOGASTRODUODENOSCOPY N/A 05/03/2016   Procedure: ESOPHAGOGASTRODUODENOSCOPY (EGD);  Surgeon: Corbin Ade, MD;  Location: [facility];  Service: Endoscopy;  Laterality: N/A;   ESOPHAGOGASTRODUODENOSCOPY N/A 08/23/2016   Procedure: ESOPHAGOGASTRODUODENOSCOPY (EGD);  Surgeon: Corbin Ade, MD;  Location: [facility];  Service:  Endoscopy;  Laterality: N/A;   ESOPHAGOGASTRODUODENOSCOPY (EGD) WITH PROPOFOL N/A 09/20/2020   normal esophagus. Large hiatal hernia. Multiple gastric erosions (Cameron lesions). Normal duodenal bulb and second portion of the duodenum. Suspicion Sheria Lang lesions causing intermittent bleeding   KNEE ARTHROSCOPY Right    KNEE ARTHROSCOPY WITH MEDIAL MENISECTOMY Left 12/09/2014   Procedure: KNEE ARTHROSCOPY WITH PARTIAL MEDIAL AND LATERAL MENISECTOMY;  Surgeon: Darreld Mclean, MD;  Location: [facility];  Service: Orthopedics;  Laterality: Left;   TUBAL LIGATION     WISDOM TOOTH EXTRACTION     Social History   Occupational History   Occupation: U[employer]ployed    Comment: Disability  Tobacco Use   Smoking status: Never   Smokeless tobacco: Never  Vaping Use   Vaping Use: Never used  Substance and Sexual Activity   Alcohol use: No   Drug use: No   Sexual activity: Not Currently    Birth control/protection: Surgical

## 2022-11-02 ENCOUNTER — Other Ambulatory Visit: Payer: Self-pay | Admitting: Family

## 2022-11-02 DIAGNOSIS — M546 Pain in thoracic spine: Secondary | ICD-10-CM

## 2022-11-05 ENCOUNTER — Other Ambulatory Visit: Payer: Self-pay | Admitting: Family

## 2022-11-05 DIAGNOSIS — M546 Pain in thoracic spine: Secondary | ICD-10-CM

## 2022-11-14 ENCOUNTER — Encounter: Payer: Self-pay | Admitting: Hematology

## 2022-11-16 DIAGNOSIS — K449 Diaphragmatic hernia without obstruction or gangrene: Secondary | ICD-10-CM | POA: Diagnosis not present

## 2022-11-16 DIAGNOSIS — K219 Gastro-esophageal reflux disease without esophagitis: Secondary | ICD-10-CM | POA: Diagnosis not present

## 2022-11-16 DIAGNOSIS — R079 Chest pain, unspecified: Secondary | ICD-10-CM | POA: Diagnosis not present

## 2022-11-17 ENCOUNTER — Ambulatory Visit
Admission: RE | Admit: 2022-11-17 | Discharge: 2022-11-17 | Disposition: A | Discharge status: Home / Self Care | Payer: 59 | Source: Ambulatory Visit | Attending: Orthopaedic Surgery | Admitting: Orthopaedic Surgery

## 2022-11-17 DIAGNOSIS — M47816 Spondylosis without myelopathy or radiculopathy, lumbar region: Secondary | ICD-10-CM | POA: Diagnosis not present

## 2022-11-17 DIAGNOSIS — M545 Low back pain, unspecified: Secondary | ICD-10-CM

## 2022-11-17 DIAGNOSIS — M5126 Other intervertebral disc displacement, lumbar region: Secondary | ICD-10-CM | POA: Diagnosis not present

## 2022-11-28 ENCOUNTER — Other Ambulatory Visit: Payer: Self-pay | Admitting: Family

## 2022-11-28 DIAGNOSIS — K279 Peptic ulcer, site unspecified, unspecified as acute or chronic, without hemorrhage or perforation: Secondary | ICD-10-CM

## 2022-11-28 DIAGNOSIS — I10 Essential (primary) hypertension: Secondary | ICD-10-CM

## 2022-11-28 DIAGNOSIS — F411 Generalized anxiety disorder: Secondary | ICD-10-CM

## 2022-11-28 DIAGNOSIS — F321 Major depressive disorder, single episode, moderate: Secondary | ICD-10-CM

## 2022-11-29 ENCOUNTER — Ambulatory Visit (INDEPENDENT_AMBULATORY_CARE_PROVIDER_SITE_OTHER): Payer: 59 | Admitting: Orthopaedic Surgery

## 2022-11-29 ENCOUNTER — Encounter: Payer: Self-pay | Admitting: Orthopaedic Surgery

## 2022-11-29 VITALS — Ht 67.0 in | Wt 208.0 lb

## 2022-11-29 DIAGNOSIS — M5126 Other intervertebral disc displacement, lumbar region: Secondary | ICD-10-CM | POA: Diagnosis not present

## 2022-11-29 NOTE — Progress Notes (Signed)
Office Visit Note   Patient: Brittany Durham           Date of Birth: 06/09/1970           MRN: [id number] Visit Date: 11/29/2022              Requested by: [name], FNP [address] [place],  [place] [address] PCP: [name], FNP   Assessment & Plan: Visit Diagnoses:  1. Protrusion of lumbar intervertebral disc     Plan: If she gets worse she will call and we will schedule her for an epidural injection with Dr. Alvester Morin on the left at L4-5 where she has the extraforaminal moderate protrusion.  Recheck 2 months otherwise.  Follow-Up Instructions: Return in about 2 months (around 01/30/2023).   Orders:  No orders of the defined types were placed in this encounter.  No orders of the defined types were placed in this encounter.     Procedures: No procedures performed   Clinical Data: No additional findings.   Subjective: Chief Complaint  Patient presents with   Lower Back - Pain, Follow-up    MRI review    HPI 52 year old female returns with ongoing problems lumbar pain lumbosacral junction radiates in the upper buttocks does not go down her leg.  MRI scan has been obtained and is reviewed today.  This shows moderate size extraforaminal disc extrusion on the left at L4-5 with left L4 nerve root encroachment.  She not had any weakness in her leg no falling.  Crease discomfort bending and twisting.  Review of Systems all systems updated unchanged   Objective: Vital Signs: Ht 5\' 7"  (1.702 m)   Wt 208 lb (94.3 kg)   LMP 09/16/2017   BMI 32.58 kg/m   Physical Exam Constitutional:      Appearance: She is well-developed.  HENT:     Head: Normocephalic.     Right Ear: External ear normal.     Left Ear: External ear normal. There is no impacted cerumen.  Eyes:     Pupils: Pupils are equal, round, and reactive to light.  Neck:     Thyroid: No thyromegaly.     Trachea: No tracheal deviation.  Cardiovascular:     Rate and Rhythm: Normal rate.   Pulmonary:     Effort: Pulmonary effort is normal.  Abdominal:     Palpations: Abdomen is soft.  Musculoskeletal:     Cervical back: No rigidity.  Skin:    General: Skin is warm and dry.  Neurological:     Mental Status: She is alert and oriented to person, place, and time.  Psychiatric:        Behavior: Behavior normal.     Ortho Exam anterior tib gastrocsoleus is strong.  No sciatic notch tenderness on the left or right.  Tenderness at the lumbosacral junction.  Specialty Comments:  No specialty comments available.  Imaging: Narrative & Impression  CLINICAL DATA:  Low back pain with claudication for 3 months. No known injury or prior relevant surgery.   EXAM: MRI LUMBAR SPINE WITHOUT CONTRAST   TECHNIQUE: Multiplanar, multisequence MR imaging of the lumbar spine was performed. No intravenous contrast was administered.   COMPARISON:  Lumbar spine radiographs 10/27/2020. Abdominopelvic CT 07/26/2022.   FINDINGS: Segmentation: Conventional anatomy assumed, with the last open disc space designated L5-S1.Concordant with prior imaging.   Alignment:  Minimal convex left scoliosis.   Vertebrae: There is diffusely decreased T1 and T2 marrow signal without focally  suspicious lesion, acute fracture or pars defect. The visualized sacroiliac joints appear unremarkable.   Conus medullaris: Extends to the L1 level and appears normal.   Paraspinal and other soft tissues: No significant paraspinal findings. Uterine fibroids noted.   Disc levels:   Sagittal images demonstrate mild disc bulging at T11-12 and T12-L1 without resulting spinal stenosis or nerve root encroachment.   L1-2: Disc desiccation with mild bulging and bilateral facet hypertrophy. No significant spinal stenosis or nerve root encroachment.   L2-3: Preserved disc height and hydration. Mild disc bulging eccentric to the left and mild bilateral facet hypertrophy. No spinal stenosis or nerve root  encroachment.   L3-4: Preserved disc height with mild disc bulging, facet and ligamentous hypertrophy. No significant central spinal stenosis. Mild foraminal narrowing bilaterally without definite L3 nerve root encroachment.   L4-5: Preserved disc height with mild disc bulging and a moderate-sized extraforaminal disc extrusion on the left. Resulting left foraminal narrowing and L4 nerve root encroachment. Mild facet and ligamentous hypertrophy. The left lateral recess is mildly narrowed. The spinal canal and right foramen are patent.   L5-S1: Preserved disc height and hydration. Moderate bilateral facet hypertrophy. No spinal stenosis or nerve root encroachment.   IMPRESSION: 1. Moderate-sized extraforaminal disc extrusion on the left at L4-5 with resulting left L4 nerve root encroachment. Correlate with specific radicular symptoms. 2. No other significant spinal stenosis or nerve root encroachment. 3. Nonspecific diffusely decreased T1 and T2 marrow signal without focally suspicious lesion. This may be related to anemia, smoking or obesity.     Electronically Signed   By: Carey Bullocks M.D.   On: 11/25/2022 13:21       PMFS History: Patient Active Problem List   Diagnosis Date Noted   Protrusion of lumbar intervertebral disc 11/29/2022   Biliary dyskinesia 02/01/2021   Right flank pain 10/27/2020   Iron deficiency anemia due to chronic blood loss 09/15/2020   Symptomatic anemia 07/20/2020   Depression, major, single episode, moderate (HCC) 07/15/2020   Vertigo 01/26/2020   GAD (generalized anxiety disorder) 07/28/2019   Radiculopathy, cervical region 07/16/2019   S/P cervical spinal fusion 03/20/2019   Osteoarthritis 11/18/2017   Insomnia 11/18/2017   Synovitis, villonodular, knee, right 10/28/2017   Tear of lateral meniscus of right knee, current 10/28/2017   Chondromalacia patellae, 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   Language difficulty 09/25/2016   Nausea with vomiting  07/18/2016   PUD (peptic ulcer disease) 07/18/2016   Slurred speech    GI bleed 05/02/2016   Sinus tachycardia 05/02/2016   Acute blood loss anemia 05/02/2016   Speech disorder 03/31/2015   Thyroid nodule 12/14/2014   Left knee pain 12/14/2014   Seizures (HCC)    Vitamin D deficiency 06/30/2014   Gastroesophageal reflux disease without esophagitis 06/29/2014   SYNCOPE 03/02/2010   HYPERTENSION, BENIGN 03/01/2010   Past Medical History:  Diagnosis Date   Anemia    Asthma    childhood   Carotidynia    COPD (chronic obstructive pulmonary disease) (HCC)    DDD (degenerative disc disease), cervical    GERD (gastroesophageal reflux disease)    Hypertension    Knee pain    left meniscus tear s/p repair 11/2014   Seizures (HCC)    last seizure was over 10 years ago, unknown etiology   Stuttering    patient reports this started after her knee surgery in 11/2014.    Thyroid nodule     Family History  Problem Relation Age of Onset  Hypertension Mother    Lung cancer Father    Hypertension Brother    Colon cancer Neg Hx    Gastric cancer Neg Hx    Esophageal cancer Neg Hx     Past Surgical History:  Procedure Laterality Date   ANTERIOR CERVICAL DECOMP/DISCECTOMY FUSION N/A 03/20/2019   Procedure: Anterior Cervical Decompression Fusion - Cervical Four-Cervical Five - Cervical Five-Cervical Six;  Surgeon: Tia Alert, MD;  Location: [facility] OR;  Service: Neurosurgery;  Laterality: N/A;  Anterior Cervical Decompression Fusion - Cervical Four-Cervical Five - Cervical Five-Cervical Six   BIOPSY  09/20/2020   Procedure: BIOPSY;  Surgeon: Corbin Ade, MD;  Location: [facility];  Service: Endoscopy;;   COLONOSCOPY N/A 08/23/2016   Procedure: COLONOSCOPY;  Surgeon: Corbin Ade, MD;  Location: [facility];  Service: Endoscopy;  Laterality: N/A;  1:00pm   COLONOSCOPY N/A 08/24/2020   Diverticulosis in the entire examined colon. Redundant colon. Exam otherwise normal    ESOPHAGOGASTRODUODENOSCOPY N/A 05/03/2016   Procedure: ESOPHAGOGASTRODUODENOSCOPY (EGD);  Surgeon: Corbin Ade, MD;  Location: [facility];  Service: Endoscopy;  Laterality: N/A;   ESOPHAGOGASTRODUODENOSCOPY N/A 08/23/2016   Procedure: ESOPHAGOGASTRODUODENOSCOPY (EGD);  Surgeon: Corbin Ade, MD;  Location: [facility];  Service: Endoscopy;  Laterality: N/A;   ESOPHAGOGASTRODUODENOSCOPY (EGD) WITH PROPOFOL N/A 09/20/2020   normal esophagus. Large hiatal hernia. Multiple gastric erosions (Cameron lesions). Normal duodenal bulb and second portion of the duodenum. Suspicion Sheria Lang lesions causing intermittent bleeding   KNEE ARTHROSCOPY Right    KNEE ARTHROSCOPY WITH MEDIAL MENISECTOMY Left 12/09/2014   Procedure: KNEE ARTHROSCOPY WITH PARTIAL MEDIAL AND LATERAL MENISECTOMY;  Surgeon: Darreld Mclean, MD;  Location: [facility];  Service: Orthopedics;  Laterality: Left;   TUBAL LIGATION     WISDOM TOOTH EXTRACTION     Social History   Occupational History   Occupation: [employer]ployed    Comment: Disability  Tobacco Use   Smoking status: Never   Smokeless tobacco: Never  Vaping Use   Vaping status: Never Used  Substance and Sexual Activity   Alcohol use: No   Drug use: No   Sexual activity: Not Currently    Birth control/protection: Surgical

## 2022-11-30 ENCOUNTER — Other Ambulatory Visit: Payer: Self-pay | Admitting: Physician Assistant

## 2022-11-30 ENCOUNTER — Inpatient Hospital Stay: Payer: 59 | Attending: Hematology

## 2022-11-30 DIAGNOSIS — D509 Iron deficiency anemia, unspecified: Secondary | ICD-10-CM | POA: Insufficient documentation

## 2022-11-30 DIAGNOSIS — D649 Anemia, unspecified: Secondary | ICD-10-CM

## 2022-11-30 DIAGNOSIS — D500 Iron deficiency anemia secondary to blood loss (chronic): Secondary | ICD-10-CM

## 2022-11-30 LAB — COMPREHENSIVE METABOLIC PANEL
ALT: 30 U/L (ref 0–44)
AST: 26 U/L (ref 15–41)
Albumin: 4.1 g/dL (ref 3.5–5.0)
Alkaline Phosphatase: 77 U/L (ref 38–126)
Anion gap: 8 (ref 5–15)
BUN: 15 mg/dL (ref 6–20)
CO2: 25 mmol/L (ref 22–32)
Calcium: 9.3 mg/dL (ref 8.9–10.3)
Chloride: 105 mmol/L (ref 98–111)
Creatinine, Ser: 0.84 mg/dL (ref 0.44–1.00)
GFR, Estimated: >60 mL/min (ref >60)
Glucose, Bld: 102 mg/dL — ABNORMAL HIGH (ref 70–99)
Potassium: 3.6 mmol/L (ref 3.5–5.1)
Sodium: 138 mmol/L (ref 135–145)
Total Bilirubin: 0.7 mg/dL (ref 0.3–1.2)
Total Protein: 7.8 g/dL (ref 6.5–8.1)

## 2022-11-30 LAB — CBC WITH DIFFERENTIAL/PLATELET
Abs Immature Granulocytes: 0.01 10*3/uL (ref 0.00–0.07)
Basophils Absolute: 0 10*3/uL (ref 0.0–0.1)
Basophils Relative: 0 %
Eosinophils Absolute: 0.2 10*3/uL (ref 0.0–0.5)
Eosinophils Relative: 4 %
HCT: 27.8 % — ABNORMAL LOW (ref 36.0–46.0)
Hemoglobin: 8.4 g/dL — ABNORMAL LOW (ref 12.0–15.0)
Immature Granulocytes: 0 %
Lymphocytes Relative: 25 %
Lymphs Abs: 1.3 10*3/uL (ref 0.7–4.0)
MCH: 26.3 pg (ref 26.0–34.0)
MCHC: 30.2 g/dL (ref 30.0–36.0)
MCV: 87.1 fL (ref 80.0–100.0)
Monocytes Absolute: 0.5 10*3/uL (ref 0.1–1.0)
Monocytes Relative: 9 %
Neutro Abs: 3.1 10*3/uL (ref 1.7–7.7)
Neutrophils Relative %: 62 %
Platelets: 294 10*3/uL (ref 150–400)
RBC: 3.19 MIL/uL — ABNORMAL LOW (ref 3.87–5.11)
RDW: 14 % (ref 11.5–15.5)
WBC: 5.2 10*3/uL (ref 4.0–10.5)
nRBC: 0 % (ref 0.0–0.2)

## 2022-11-30 LAB — FERRITIN: Ferritin: 7 ng/mL — ABNORMAL LOW (ref 11–307)

## 2022-11-30 LAB — IRON AND TIBC
Iron: 18 ug/dL — ABNORMAL LOW (ref 28–170)
Saturation Ratios: 4 % — ABNORMAL LOW (ref 10.4–31.8)
TIBC: 458 ug/dL — ABNORMAL HIGH (ref 250–450)
UIBC: 440 ug/dL

## 2022-11-30 NOTE — Progress Notes (Signed)
Orders entered for Feraheme x 3. First dose to be given same day as patient appointment next week.

## 2022-12-05 NOTE — Progress Notes (Unsigned)
[facility] [address], [place] [address]   CLINIC:  Medical Oncology/Hematology  PCP:  [name], FNP [address] [place] [address] [phone]   REASON FOR VISIT:  Follow-up for iron deficiency anemia   CURRENT THERAPY: Intermittent IV iron  INTERVAL HISTORY:   Brittany Durham 52 y.o. female returns for routine follow-up of her iron deficiency anemia.  She was last seen by Rojelio Brenner PA-C on 08/29/2022.   At today's visit, she reports feeling poorly due to back pain. ***  She received IV Feraheme x 3 in April/May 2024, and reports feeling transient increase in energy after IV iron.  *** *** She had recurrent fatigue after a few weeks. *** She reports occasional chest pain (none today) and palpitations. *** No pica, dyspnea, lightheadedness, or syncope.  She has not noticed any recent rectal bleeding or melena since her colonoscopy in November 2023.  *** *** She has not had any further vaginal bleeding since her isolated episode in late 2022.  She has little to no*** energy and 70***% appetite. She endorses that she is maintaining a stable weight.  ASSESSMENT & PLAN:  1.  Iron deficiency anemia with suspected occult GI bleed - ED on 07/18/2020 with Hgb 5.7, fatigue, lightheadedness.  Denied overt GI bleeding and was Hemoccult-negative, but does have a history of peptic ulcer disease.  She was transfused 2 units PRBCs on 07/18/2020, and followed up with gastroenterology.  - Colonoscopy (08/24/2020) showed diverticulosis, but was otherwise normal. - EGD (09/20/2020): Large hiatal hernia, Cameron lesions and antral erosions (suspicious for causing intermittent bleeding in the setting of ongoing NSAID use per GI note) - EGD and colonoscopy at [facility] on 03/30/2022, which showed multiple scattered pancolonic diverticula of moderate severity otherwise normal colonoscopy; EGD was normal  - She is on Protonix for peptic ulcer  disease - Hematology work-up (07/29/2020) showed Hgb 8.5 (microcytic with MCV 78.2).  Ferritin was 7, with 32% iron saturation.  Reticulocytes 3.4 (reticulocyte index 1.61, consistent with hypoproliferation in the setting of iron deficiency).   - Most recent IV iron with Feraheme x3 in April/May 2024 - Per outside medical records, patient had mild postinfusion reaction with rash on bilateral arms after Venofer infusion on 09/29/2021.  Tolerated Feraheme well with PREMEDICATION. - Intermittent rectal bleeding.  No recent signs of melena.*** - Follows with [facility] Berna Bue PA-C) - has an upcoming appointment in June*** - Symptomatic with fatigue*** - Most recent labs (11/30/2022): Hgb 8.4/MCV 87.1.  Ferritin 7, iron saturation 4%, TIBC 458.  CMP unremarkable. Labs from October 2023 showed CMP unremarkable, LDH normal; normal B12, MMA, folate, SPEP, haptoglobin. - PLAN: Recommend additional IV Feraheme x3 (first dose today) PREMEDICATE WITH STEROIDS due to possible reaction after most recent Venofer. - Continue GI and GYN follow-up.  - RTC in 2 months for repeat CBC and iron panel followed by office visit  PLAN SUMMARY: >> Feraheme x 3 (first dose given today) >> Labs in 2 months = CBC/D, ferritin, iron/TIBC >> OFFICE visit in 2 months    REVIEW OF SYSTEMS:***  Review of Systems  Constitutional:  Positive for fatigue. Negative for appetite change, chills, diaphoresis, fever and unexpected weight change.  HENT:   Negative for lump/mass and nosebleeds.   Eyes:  Negative for eye problems.  Respiratory:  Negative for cough, hemoptysis and shortness of breath.   Cardiovascular:  Positive for chest pain (none today) and palpitations. Negative for  leg swelling.  Gastrointestinal:  Negative for abdominal pain, blood in stool, constipation, diarrhea, nausea and vomiting.  Genitourinary:  Negative for hematuria.   Musculoskeletal:  Positive for arthralgias and back pain.  Skin: Negative.    Neurological:  Positive for numbness. Negative for dizziness, headaches and light-headedness.  Hematological:  Does not bruise/bleed easily.  Psychiatric/Behavioral:  Positive for sleep disturbance.      PHYSICAL EXAM:***  ECOG PERFORMANCE STATUS: 1 - Symptomatic but completely ambulatory  There were no vitals filed for this visit. There were no vitals filed for this visit. Physical Exam Constitutional:      Appearance: Normal appearance. She is obese.  HENT:     Head: Normocephalic and atraumatic.     Mouth/Throat:     Mouth: Mucous membranes are moist.  Eyes:     Extraocular Movements: Extraocular movements intact.     Pupils: Pupils are equal, round, and reactive to light.  Cardiovascular:     Rate and Rhythm: Regular rhythm.     Pulses: Normal pulses.     Heart sounds: Normal heart sounds.  Pulmonary:     Effort: Pulmonary effort is normal.     Breath sounds: Normal breath sounds.  Abdominal:     General: Bowel sounds are normal.     Palpations: Abdomen is soft.     Tenderness: There is no abdominal tenderness.  Musculoskeletal:        General: No swelling.     Right lower leg: No edema.     Left lower leg: No edema.  Lymphadenopathy:     Cervical: No cervical adenopathy.  Skin:    General: Skin is warm and dry.  Neurological:     General: No focal deficit present.     Mental Status: She is alert and oriented to person, place, and time.  Psychiatric:        Mood and Affect: Mood normal.        Behavior: Behavior normal.     PAST MEDICAL/SURGICAL HISTORY:  Past Medical History:  Diagnosis Date   Anemia    Asthma    childhood   Carotidynia    COPD (chronic obstructive pulmonary disease) (HCC)    DDD (degenerative disc disease), cervical    GERD (gastroesophageal reflux disease)    Hypertension    Knee pain    left meniscus tear s/p repair 11/2014   Seizures (HCC)    last seizure was over 10 years ago, unknown etiology   Stuttering    patient reports  this started after her knee surgery in 11/2014.    Thyroid nodule    Past Surgical History:  Procedure Laterality Date   ANTERIOR CERVICAL DECOMP/DISCECTOMY FUSION N/A 03/20/2019   Procedure: Anterior Cervical Decompression Fusion - Cervical Four-Cervical Five - Cervical Five-Cervical Six;  Surgeon: Tia Alert, MD;  Location: [facility] OR;  Service: Neurosurgery;  Laterality: N/A;  Anterior Cervical Decompression Fusion - Cervical Four-Cervical Five - Cervical Five-Cervical Six   BIOPSY  09/20/2020   Procedure: BIOPSY;  Surgeon: Corbin Ade, MD;  Location: [facility];  Service: Endoscopy;;   COLONOSCOPY N/A 08/23/2016   Procedure: COLONOSCOPY;  Surgeon: Corbin Ade, MD;  Location: [facility];  Service: Endoscopy;  Laterality: N/A;  1:00pm   COLONOSCOPY N/A 08/24/2020   Diverticulosis in the entire examined colon. Redundant colon. Exam otherwise normal   ESOPHAGOGASTRODUODENOSCOPY N/A 05/03/2016   Procedure: ESOPHAGOGASTRODUODENOSCOPY (EGD);  Surgeon: Corbin Ade, MD;  Location: [facility];  Service:  Endoscopy;  Laterality: N/A;   ESOPHAGOGASTRODUODENOSCOPY N/A 08/23/2016   Procedure: ESOPHAGOGASTRODUODENOSCOPY (EGD);  Surgeon: Corbin Ade, MD;  Location: [facility];  Service: Endoscopy;  Laterality: N/A;   ESOPHAGOGASTRODUODENOSCOPY (EGD) WITH PROPOFOL N/A 09/20/2020   normal esophagus. Large hiatal hernia. Multiple gastric erosions (Cameron lesions). Normal duodenal bulb and second portion of the duodenum. Suspicion Sheria Lang lesions causing intermittent bleeding   KNEE ARTHROSCOPY Right    KNEE ARTHROSCOPY WITH MEDIAL MENISECTOMY Left 12/09/2014   Procedure: KNEE ARTHROSCOPY WITH PARTIAL MEDIAL AND LATERAL MENISECTOMY;  Surgeon: Darreld Mclean, MD;  Location: [facility] OR[facility];  Service: Orthopedics;  Laterality: Left;   TUBAL LIGATION     WISDOM TOOTH EXTRACTION      SOCIAL HISTORY:  Social History   Socioeconomic History   Marital status: Single    Spouse name: Not on file    Number of children: 1   Years of education: 12   Highest education level: Not on file  Occupational History   Occupation: Umemployed    Comment: Disability  Tobacco Use   Smoking status: Never   Smokeless tobacco: Never  Vaping Use   Vaping status: Never Used  Substance and Sexual Activity   Alcohol use: No   Drug use: No   Sexual activity: Not Currently    Birth control/protection: Surgical  Other Topics Concern   Not on file  Social History Narrative   Lives at home alone.   Disabled - speech difficulties, cannot drive   Right-handed.   Occasional caffeine use.   Her daughter lives about 30-45 minutes away   She has lots of family nearby - uncle, mother, sister, brother   Social Determinants of Health   Financial Resource Strain: Low Risk  (12/14/2021)   Overall Financial Resource Strain (CARDIA)    Difficulty of Paying Living Expenses: Not hard at all  Food Insecurity: No Food Insecurity (12/14/2021)   Hunger Vital Sign    Worried About Running Out of Food in the Last Year: Never true    Ran Out of Food in the Last Year: Never true  Transportation Needs: No Transportation Needs (12/14/2021)   PRAPARE - Administrator, Civil Service (Medical): No    Lack of Transportation (Non-Medical): No  Physical Activity: Insufficiently Active (12/14/2021)   Exercise Vital Sign    Days of Exercise per Week: 2 days    Minutes of Exercise per Session: 20 min  Stress: No Stress Concern Present (12/14/2021)   [employer] of Occupational Health - Occupational Stress Questionnaire    Feeling of Stress : Not at all  Social Connections: Moderately Isolated (12/14/2021)   Social Connection and Isolation Panel [NHANES]    Frequency of Communication with Friends and Family: More than three times a week    Frequency of Social Gatherings with Friends and Family: Three times a week    Attends Religious Services: More than 4 times per year    Active Member of Clubs or Organizations:  No    Attends Banker Meetings: Never    Marital Status: Never married  Intimate Partner Violence: Not At Risk (12/14/2021)   Humiliation, Afraid, Rape, and Kick questionnaire    Fear of Current or Ex-Partner: No    Emotionally Abused: No    Physically Abused: No    Sexually Abused: No    FAMILY HISTORY:  Family History  Problem Relation Age of Onset   Hypertension Mother    Lung cancer Father    Hypertension  Brother    Colon cancer Neg Hx    Gastric cancer Neg Hx    Esophageal cancer Neg Hx     CURRENT MEDICATIONS:  Outpatient Encounter Medications as of 12/06/2022  Medication Sig   amLODipine (NORVASC) 10 MG tablet TAKE ONE TABLET EVERY DAY   baclofen (LIORESAL) 10 MG tablet TAKE ONE TABLET THREE TIMES DAILY   buPROPion (WELLBUTRIN XL) 150 MG 24 hr tablet TAKE 1 TABLET DAILY   busPIRone (BUSPAR) 5 MG tablet TAKE ONE TABLET BY MOUTH UP TO THREE TIMES DAILY.   celecoxib (CELEBREX) 200 MG capsule TAKE ONE CAPSULE TWICE DAILY   diazepam (VALIUM) 5 MG tablet Take one tablet one hour prior to procedure. May repeat in 30 minutes if needed. MUST HAVE DRIVER.   escitalopram (LEXAPRO) 20 MG tablet TAKE ONE TABLET EVERY DAY   lisinopril (ZESTRIL) 20 MG tablet TAKE ONE TABLET EVERY DAY   pantoprazole (PROTONIX) 40 MG tablet TAKE ONE TABLET EVERY DAY BEFORE BREAKFAST   predniSONE (DELTASONE) 10 MG tablet Take by mouth. (Patient not taking: Reported on 11/01/2022)   predniSONE (STERAPRED UNI-PAK 21 TAB) 10 MG (21) TBPK tablet Use as directed (Patient not taking: Reported on 11/01/2022)   No facility-administered encounter medications on file as of 12/06/2022.    ALLERGIES:  Allergies  Allergen Reactions   Acetaminophen Other (See Comments)    MD requests not to be given   Venofer [Iron Sucrose] Rash    LABORATORY DATA:  I have reviewed the labs as listed.  CBC    Component Value Date/Time   WBC 5.2 11/30/2022 1015   RBC 3.19 (L) 11/30/2022 1015   HGB 8.4 (L)  11/30/2022 1015   HGB 12.6 10/24/2020 1318   HCT 27.8 (L) 11/30/2022 1015   HCT 39.0 10/24/2020 1318   PLT 294 11/30/2022 1015   PLT 277 10/24/2020 1318   MCV 87.1 11/30/2022 1015   MCV 88 10/24/2020 1318   MCH 26.3 11/30/2022 1015   MCHC 30.2 11/30/2022 1015   RDW 14.0 11/30/2022 1015   RDW 14.8 10/24/2020 1318   LYMPHSABS 1.3 11/30/2022 1015   LYMPHSABS 2.3 10/24/2020 1318   MONOABS 0.5 11/30/2022 1015   EOSABS 0.2 11/30/2022 1015   EOSABS 0.2 10/24/2020 1318   BASOSABS 0.0 11/30/2022 1015   BASOSABS 0.0 10/24/2020 1318      Latest Ref Rng & Units 11/30/2022   10:15 AM 09/06/2022   10:55 AM 03/02/2022   11:14 AM  CMP  Glucose 70 - 99 mg/dL 829  84  95   BUN 6 - 20 mg/dL 15  21  11    Creatinine 0.44 - 1.00 mg/dL 5.62  1.30  8.65   Sodium 135 - 145 mmol/L 138  140  140   Potassium 3.5 - 5.1 mmol/L 3.6  4.0  3.7   Chloride 98 - 111 mmol/L 105  102  104   CO2 22 - 32 mmol/L 25  22  27    Calcium 8.9 - 10.3 mg/dL 9.3  9.8  9.8   Total Protein 6.5 - 8.1 g/dL 7.8  6.9  7.5   Total Bilirubin 0.3 - 1.2 mg/dL 0.7  0.2  0.4   Alkaline Phos 38 - 126 U/L 77  102  69   AST 15 - 41 U/L 26  10  14    ALT 0 - 44 U/L 30  15  17      DIAGNOSTIC IMAGING:  I have independently reviewed the relevant imaging and discussed with  the patient.   WRAP UP:  All questions were answered. The patient knows to call the clinic with any problems, questions or concerns.  Medical decision making: Low***  Time spent on visit: I spent 15*** minutes counseling the patient face to face. The total time spent in the appointment was 22*** minutes and more than 50% was on counseling.  Brittany Guadalajara, PA-C  ***

## 2022-12-06 ENCOUNTER — Inpatient Hospital Stay: Payer: 59

## 2022-12-06 ENCOUNTER — Inpatient Hospital Stay (HOSPITAL_BASED_OUTPATIENT_CLINIC_OR_DEPARTMENT_OTHER): Payer: 59 | Admitting: Physician Assistant

## 2022-12-06 VITALS — BP 127/80 | HR 91 | Temp 97.0°F | Resp 18

## 2022-12-06 VITALS — BP 131/79 | HR 96 | Temp 97.4°F | Resp 18 | Wt 204.4 lb

## 2022-12-06 DIAGNOSIS — D62 Acute posthemorrhagic anemia: Secondary | ICD-10-CM

## 2022-12-06 DIAGNOSIS — D509 Iron deficiency anemia, unspecified: Secondary | ICD-10-CM | POA: Diagnosis not present

## 2022-12-06 DIAGNOSIS — D500 Iron deficiency anemia secondary to blood loss (chronic): Secondary | ICD-10-CM

## 2022-12-06 MED ORDER — CETIRIZINE HCL 10 MG/ML IV SOLN
10.0000 mg | Freq: Once | INTRAVENOUS | Status: AC
  Administered 2022-12-06: 10 mg via INTRAVENOUS
  Filled 2022-12-06: qty 1

## 2022-12-06 MED ORDER — SODIUM CHLORIDE 0.9 % IV SOLN
510.0000 mg | Freq: Once | INTRAVENOUS | Status: AC
  Administered 2022-12-06: 510 mg via INTRAVENOUS
  Filled 2022-12-06: qty 510

## 2022-12-06 MED ORDER — METHYLPREDNISOLONE SODIUM SUCC 125 MG IJ SOLR
125.0000 mg | Freq: Once | INTRAMUSCULAR | Status: AC
  Administered 2022-12-06: 125 mg via INTRAVENOUS
  Filled 2022-12-06: qty 2

## 2022-12-06 MED ORDER — FAMOTIDINE IN NACL 20-0.9 MG/50ML-% IV SOLN
20.0000 mg | Freq: Once | INTRAVENOUS | Status: AC
  Administered 2022-12-06: 20 mg via INTRAVENOUS
  Filled 2022-12-06: qty 50

## 2022-12-06 MED ORDER — SODIUM CHLORIDE 0.9 % IV SOLN: Freq: Once | INTRAVENOUS | Status: AC

## 2022-12-06 NOTE — Progress Notes (Signed)
Patient presents today for iron infusion.  Patient is in satisfactory condition with no new complaints voiced.  Vital signs are stable.  IV placed in left AC.  IV flushed well with good blood return noted.  We will proceed with infusion per provider orders.   Patient tolerated infusion well with no complaints voiced.  Patient left ambulatory in stable condition.  Vital signs stable at discharge.  Follow up as scheduled.    

## 2022-12-06 NOTE — Patient Instructions (Signed)
[facility]  Discharge Instructions: Thank you for choosing [facility] to provide your oncology and hematology care.  If you have a lab appointment with the Cancer Center - please note that after April 8th, 2024, all labs will be drawn in the cancer center.  You do not have to check in or register with the main entrance as you have in the past but will complete your check-in in the cancer center.  Wear comfortable clothing and clothing appropriate for easy access to any Portacath or PICC line.   We strive to give you quality time with your provider. You may need to reschedule your appointment if you arrive late (15 or more minutes).  Arriving late affects you and other patients whose appointments are after yours.  Also, if you miss three or more appointments without notifying the office, you may be dismissed from the clinic at the provider's discretion.      For prescription refill requests, have your pharmacy contact our office and allow 72 hours for refills to be completed.    Today you received the following:  Feraheme.  Ferumoxytol Injection What is this medication? FERUMOXYTOL (FER ue MOX i tol) treats low levels of iron in your body (iron deficiency anemia). Iron is a mineral that plays an important role in making red blood cells, which carry oxygen from your lungs to the rest of your body. This medicine may be used for other purposes; ask your health care provider or pharmacist if you have questions. COMMON BRAND NAME(S): Feraheme What should I tell my care team before I take this medication? They need to know if you have any of these conditions: Anemia not caused by low iron levels High levels of iron in the blood Magnetic resonance imaging (MRI) test scheduled An unusual or allergic reaction to iron, other medications, foods, dyes, or preservatives Pregnant or trying to get pregnant Breastfeeding How should I use this medication? This medication  is injected into a vein. It is given by your care team in a hospital or clinic setting. Talk to your care team the use of this medication in children. Special care may be needed. Overdosage: If you think you have taken too much of this medicine contact a poison control center or emergency room at once. NOTE: This medicine is only for you. Do not share this medicine with others. What if I miss a dose? It is important not to miss your dose. Call your care team if you are unable to keep an appointment. What may interact with this medication? Other iron products This list may not describe all possible interactions. Give your health care provider a list of all the medicines, herbs, non-prescription drugs, or dietary supplements you use. Also tell them if you smoke, drink alcohol, or use illegal drugs. Some items may interact with your medicine. What should I watch for while using this medication? Visit your care team regularly. Tell your care team if your symptoms do not start to get better or if they get worse. You may need blood work done while you are taking this medication. You may need to follow a special diet. Talk to your care team. Foods that contain iron include: whole grains/cereals, dried fruits, beans, or peas, leafy green vegetables, and organ meats (liver, kidney). What side effects may I notice from receiving this medication? Side effects that you should report to your care team as soon as possible: Allergic reactions--skin rash, itching, hives, swelling of the  face, lips, tongue, or throat Low blood pressure--dizziness, feeling faint or lightheaded, blurry vision Shortness of breath Side effects that usually do not require medical attention (report to your care team if they continue or are bothersome): Flushing Headache Joint pain Muscle pain Nausea Pain, redness, or irritation at injection site This list may not describe all possible side effects. Call your doctor for medical  advice about side effects. You may report side effects to FDA at 1-800-FDA-1088. Where should I keep my medication? This medication is given in a hospital or clinic. It will not be stored at home. NOTE: This sheet is a summary. It may not cover all possible information. If you have questions about this medicine, talk to your doctor, pharmacist, or health care provider.  2024 Elsevier/Gold Standard (2022-10-05 00:00:00)     To help prevent nausea and vomiting after your treatment, we encourage you to take your nausea medication as directed.  BELOW ARE SYMPTOMS THAT SHOULD BE REPORTED IMMEDIATELY: *FEVER GREATER THAN 100.4 F (38 C) OR HIGHER *CHILLS OR SWEATING *NAUSEA AND VOMITING THAT IS NOT CONTROLLED WITH YOUR NAUSEA MEDICATION *UNUSUAL SHORTNESS OF BREATH *UNUSUAL BRUISING OR BLEEDING *URINARY PROBLEMS (pain or burning when urinating, or frequent urination) *BOWEL PROBLEMS (unusual diarrhea, constipation, pain near the anus) TENDERNESS IN MOUTH AND THROAT WITH OR WITHOUT PRESENCE OF ULCERS (sore throat, sores in mouth, or a toothache) UNUSUAL RASH, SWELLING OR PAIN  UNUSUAL VAGINAL DISCHARGE OR ITCHING   Items with * indicate a potential emergency and should be followed up as soon as possible or go to the Emergency Department if any problems should occur.  Please show the CHEMOTHERAPY ALERT CARD or IMMUNOTHERAPY ALERT CARD at check-in to the Emergency Department and triage nurse.  Should you have questions after your visit or need to cancel or reschedule your appointment, please contact [facility] [phone]  and follow the prompts.  Office hours are 8:00 a.m. to 4:30 p.m. Monday - Friday. Please note that voicemails left after 4:00 p.m. may not be returned until the following business day.  We are closed weekends and major holidays. You have access to a nurse at all times for urgent questions. Please call the main number to the clinic [phone] and follow  the prompts.  For any non-urgent questions, you may also contact your provider using MyChart. We now offer e-Visits for anyone 100 and older to request care online for non-urgent symptoms. For details visit [email or web address].   Also download the MyChart app! Go to the app store, search "MyChart", open the app, select [place], and log in with your MyChart username and password.

## 2022-12-06 NOTE — Patient Instructions (Signed)
[facility] at [facility] Discharge Instructions  You were seen today by Rojelio Brenner PA-C for your iron deficiency anemia.  Your blood levels and iron levels are still low.    I recommend that we schedule you for an additional IV iron x 3 doses.  We will recheck your blood and iron levels in 2 months along with an office visit.  Continue to follow-up with your GI doctor regarding possible bleeding in your stomach and intestines.  FOLLOW-UP APPOINTMENT: Same-day labs and office visit in 2 months   - - - - - - - - - - - - - - - - - -    Thank you for choosing [facility] at [facility] to provide your oncology and hematology care.  To afford each patient quality time with our provider, please arrive at least 15 minutes before your scheduled appointment time.   If you have a lab appointment with the Cancer Center please come in thru the Main Entrance and check in at the main information desk.  You need to re-schedule your appointment should you arrive 10 or more minutes late.  We strive to give you quality time with our providers, and arriving late affects you and other patients whose appointments are after yours.  Also, if you no show three or more times for appointments you may be dismissed from the clinic at the providers discretion.     Again, thank you for choosing [facility].  Our hope is that these requests will decrease the amount of time that you wait before being seen by our physicians.       _____________________________________________________________  Should you have questions after your visit to [facility], please contact our office at [phone] and follow the prompts.  Our office hours are 8:00 a.m. and 4:30 p.m. Monday - Friday.  Please note that voicemails left after 4:00 p.m. may not be returned until the following business day.  We are closed weekends and major holidays.  You do have access  to a nurse 24-7, just call the main number to the clinic [phone] and do not press any options, hold on the line and a nurse will answer the phone.    For prescription refill requests, have your pharmacy contact our office and allow 72 hours.    Due to Covid, you will need to wear a mask upon entering the hospital. If you do not have a mask, a mask will be given to you at the Main Entrance upon arrival. For doctor visits, patients may have 1 support person age 76 or older with them. For treatment visits, patients can not have anyone with them due to social distancing guidelines and our immunocompromised population.

## 2022-12-10 ENCOUNTER — Other Ambulatory Visit: Payer: Self-pay | Admitting: Family

## 2022-12-10 DIAGNOSIS — M546 Pain in thoracic spine: Secondary | ICD-10-CM

## 2022-12-13 MED FILL — Ferumoxytol Inj 510 MG/17ML (30 MG/ML) (Elemental Fe): INTRAVENOUS | Qty: 17 | Status: AC

## 2022-12-14 ENCOUNTER — Inpatient Hospital Stay: Payer: 59

## 2022-12-21 ENCOUNTER — Inpatient Hospital Stay: Payer: 59 | Attending: Hematology

## 2022-12-21 VITALS — BP 101/70 | HR 93 | Temp 97.6°F | Resp 18

## 2022-12-21 DIAGNOSIS — D62 Acute posthemorrhagic anemia: Secondary | ICD-10-CM

## 2022-12-21 DIAGNOSIS — D509 Iron deficiency anemia, unspecified: Secondary | ICD-10-CM | POA: Diagnosis not present

## 2022-12-21 DIAGNOSIS — D500 Iron deficiency anemia secondary to blood loss (chronic): Secondary | ICD-10-CM

## 2022-12-21 MED ORDER — SODIUM CHLORIDE 0.9 % IV SOLN
510.0000 mg | Freq: Once | INTRAVENOUS | Status: AC
  Administered 2022-12-21: 510 mg via INTRAVENOUS
  Filled 2022-12-21: qty 510

## 2022-12-21 MED ORDER — CETIRIZINE HCL 10 MG/ML IV SOLN
10.0000 mg | Freq: Once | INTRAVENOUS | Status: AC
  Administered 2022-12-21: 10 mg via INTRAVENOUS
  Filled 2022-12-21: qty 1

## 2022-12-21 MED ORDER — METHYLPREDNISOLONE SODIUM SUCC 125 MG IJ SOLR
125.0000 mg | Freq: Once | INTRAMUSCULAR | Status: AC
  Administered 2022-12-21: 125 mg via INTRAVENOUS
  Filled 2022-12-21: qty 2

## 2022-12-21 MED ORDER — SODIUM CHLORIDE 0.9 % IV SOLN: Freq: Once | INTRAVENOUS | Status: AC

## 2022-12-21 MED ORDER — FAMOTIDINE IN NACL 20-0.9 MG/50ML-% IV SOLN
20.0000 mg | Freq: Once | INTRAVENOUS | Status: AC
  Administered 2022-12-21: 20 mg via INTRAVENOUS
  Filled 2022-12-21: qty 50

## 2022-12-21 NOTE — Patient Instructions (Signed)
 [facility]  Discharge Instructions: Thank you for choosing [facility] to provide your oncology and hematology care.  If you have a lab appointment with the Cancer Center - please note that after April 8th, 2024, all labs will be drawn in the cancer center.  You do not have to check in or register with the main entrance as you have in the past but will complete your check-in in the cancer center.  Wear comfortable clothing and clothing appropriate for easy access to any Portacath or PICC line.   We strive to give you quality time with your provider. You may need to reschedule your appointment if you arrive late (15 or more minutes).  Arriving late affects you and other patients whose appointments are after yours.  Also, if you miss three or more appointments without notifying the office, you may be dismissed from the clinic at the provider's discretion.      For prescription refill requests, have your pharmacy contact our office and allow 72 hours for refills to be completed.    Today you received the following:  Feraheme.  Ferumoxytol Injection What is this medication? FERUMOXYTOL (FER ue MOX i tol) treats low levels of iron in your body (iron deficiency anemia). Iron is a mineral that plays an important role in making red blood cells, which carry oxygen from your lungs to the rest of your body. This medicine may be used for other purposes; ask your health care provider or pharmacist if you have questions. COMMON BRAND NAME(S): Feraheme What should I tell my care team before I take this medication? They need to know if you have any of these conditions: Anemia not caused by low iron levels High levels of iron in the blood Magnetic resonance imaging (MRI) test scheduled An unusual or allergic reaction to iron, other medications, foods, dyes, or preservatives Pregnant or trying to get pregnant Breastfeeding How should I use this medication? This medication  is injected into a vein. It is given by your care team in a hospital or clinic setting. Talk to your care team the use of this medication in children. Special care may be needed. Overdosage: If you think you have taken too much of this medicine contact a poison control center or emergency room at once. NOTE: This medicine is only for you. Do not share this medicine with others. What if I miss a dose? It is important not to miss your dose. Call your care team if you are unable to keep an appointment. What may interact with this medication? Other iron products This list may not describe all possible interactions. Give your health care provider a list of all the medicines, herbs, non-prescription drugs, or dietary supplements you use. Also tell them if you smoke, drink alcohol, or use illegal drugs. Some items may interact with your medicine. What should I watch for while using this medication? Visit your care team regularly. Tell your care team if your symptoms do not start to get better or if they get worse. You may need blood work done while you are taking this medication. You may need to follow a special diet. Talk to your care team. Foods that contain iron include: whole grains/cereals, dried fruits, beans, or peas, leafy green vegetables, and organ meats (liver, kidney). What side effects may I notice from receiving this medication? Side effects that you should report to your care team as soon as possible: Allergic reactions--skin rash, itching, hives, swelling of the  face, lips, tongue, or throat Low blood pressure--dizziness, feeling faint or lightheaded, blurry vision Shortness of breath Side effects that usually do not require medical attention (report to your care team if they continue or are bothersome): Flushing Headache Joint pain Muscle pain Nausea Pain, redness, or irritation at injection site This list may not describe all possible side effects. Call your doctor for medical  advice about side effects. You may report side effects to FDA at 1-800-FDA-1088. Where should I keep my medication? This medication is given in a hospital or clinic. It will not be stored at home. NOTE: This sheet is a summary. It may not cover all possible information. If you have questions about this medicine, talk to your doctor, pharmacist, or health care provider.  2024 Elsevier/Gold Standard (2022-10-05 00:00:00)     To help prevent nausea and vomiting after your treatment, we encourage you to take your nausea medication as directed.  BELOW ARE SYMPTOMS THAT SHOULD BE REPORTED IMMEDIATELY: *FEVER GREATER THAN 100.4 F (38 C) OR HIGHER *CHILLS OR SWEATING *NAUSEA AND VOMITING THAT IS NOT CONTROLLED WITH YOUR NAUSEA MEDICATION *UNUSUAL SHORTNESS OF BREATH *UNUSUAL BRUISING OR BLEEDING *URINARY PROBLEMS (pain or burning when urinating, or frequent urination) *BOWEL PROBLEMS (unusual diarrhea, constipation, pain near the anus) TENDERNESS IN MOUTH AND THROAT WITH OR WITHOUT PRESENCE OF ULCERS (sore throat, sores in mouth, or a toothache) UNUSUAL RASH, SWELLING OR PAIN  UNUSUAL VAGINAL DISCHARGE OR ITCHING   Items with * indicate a potential emergency and should be followed up as soon as possible or go to the Emergency Department if any problems should occur.  Please show the CHEMOTHERAPY ALERT CARD or IMMUNOTHERAPY ALERT CARD at check-in to the Emergency Department and triage nurse.  Should you have questions after your visit or need to cancel or reschedule your appointment, please contact [facility] [phone]  and follow the prompts.  Office hours are 8:00 a.m. to 4:30 p.m. Monday - Friday. Please note that voicemails left after 4:00 p.m. may not be returned until the following business day.  We are closed weekends and major holidays. You have access to a nurse at all times for urgent questions. Please call the main number to the clinic [phone] and follow  the prompts.  For any non-urgent questions, you may also contact your provider using MyChart. We now offer e-Visits for anyone 100 and older to request care online for non-urgent symptoms. For details visit [email or web address].   Also download the MyChart app! Go to the app store, search "MyChart", open the app, select [place], and log in with your MyChart username and password.

## 2022-12-21 NOTE — Progress Notes (Signed)
 Feraheme given today per MD orders. Tolerated infusion without adverse affects. Vital signs stable. No complaints at this time. Discharged from clinic ambulatory in stable condition. Alert and oriented x 3. F/U with [facility] as scheduled.  

## 2022-12-26 ENCOUNTER — Other Ambulatory Visit: Payer: Self-pay | Admitting: Family

## 2022-12-26 ENCOUNTER — Other Ambulatory Visit: Payer: Self-pay

## 2022-12-26 DIAGNOSIS — F321 Major depressive disorder, single episode, moderate: Secondary | ICD-10-CM

## 2022-12-26 DIAGNOSIS — F411 Generalized anxiety disorder: Secondary | ICD-10-CM

## 2022-12-26 DIAGNOSIS — Z1231 Encounter for screening mammogram for malignant neoplasm of breast: Secondary | ICD-10-CM

## 2022-12-27 ENCOUNTER — Ambulatory Visit (INDEPENDENT_AMBULATORY_CARE_PROVIDER_SITE_OTHER): Payer: 59

## 2022-12-27 VITALS — Ht 67.0 in | Wt 203.0 lb

## 2022-12-27 DIAGNOSIS — Z0000 Encounter for general adult medical examination without abnormal findings: Secondary | ICD-10-CM | POA: Diagnosis not present

## 2022-12-27 NOTE — Progress Notes (Signed)
Subjective:   Brittany Durham is a 52 y.o. female who presents for Medicare Annual (Subsequent) preventive examination.  Visit Complete: Virtual  I connected with  Brittany Durham on 12/27/22 by a audio enabled telemedicine application and verified that I am speaking with the correct person using two identifiers.  Patient Location: Home  Provider Location: Home Office  I discussed the limitations of evaluation and management by telemedicine. The patient expressed understanding and agreed to proceed.  Patient Medicare AWV questionnaire was completed by the patient on 12/27/2022; I have confirmed that all information answered by patient is correct and no changes since this date.  Review of Systems    Vital Signs: Unable to obtain new vitals due to this being a telehealth visit.  Cardiac Risk Factors include: advanced age (>51men, >58 women);dyslipidemia;hypertension     Objective:    Today's Vitals   12/27/22 1402  Weight: 203 lb (92.1 kg)  Height: 5\' 7"  (1.702 m)   Body mass index is 31.79 kg/m.     12/27/2022    2:05 PM 12/06/2022    9:50 AM 09/21/2022   11:25 AM 09/19/2022   11:27 AM 09/07/2022   11:15 AM 08/29/2022    9:00 AM 06/15/2022   11:30 AM  Advanced Directives  Does Patient Have a Medical Advance Directive? Yes No No No No No No  Type of Estate agent of [place];Living will  Healthcare Power of [place];Living will  Healthcare Power of [place];Living will Healthcare Power of [place];Living will   Does patient want to make changes to medical advance directive?     No - Patient declined No - Patient declined No - Patient declined  Copy of Healthcare Power of Attorney in Chart? No - copy requested  No - copy requested  No - copy requested No - copy requested   Would patient like information on creating a medical advance directive?  No - Patient declined No - Patient declined  No - Patient declined No - Patient declined No - Patient declined     Current Medications (verified) Outpatient Encounter Medications as of 12/27/2022  Medication Sig   amLODipine (NORVASC) 10 MG tablet TAKE ONE TABLET EVERY DAY   baclofen (LIORESAL) 10 MG tablet TAKE ONE TABLET THREE TIMES DAILY   buPROPion (WELLBUTRIN XL) 150 MG 24 hr tablet Take 1 tablet (150 mg total) by mouth daily. **NEEDS TO BE SEEN BEFORE NEXT REFILL**   busPIRone (BUSPAR) 5 MG tablet TAKE ONE TABLET BY MOUTH UP TO THREE TIMES DAILY.   celecoxib (CELEBREX) 200 MG capsule TAKE ONE CAPSULE TWICE DAILY   diazepam (VALIUM) 5 MG tablet Take one tablet one hour prior to procedure. May repeat in 30 minutes if needed. MUST HAVE DRIVER.   escitalopram (LEXAPRO) 20 MG tablet TAKE ONE TABLET EVERY DAY   lisinopril (ZESTRIL) 20 MG tablet TAKE ONE TABLET EVERY DAY   pantoprazole (PROTONIX) 40 MG tablet TAKE ONE TABLET EVERY DAY BEFORE BREAKFAST   predniSONE (DELTASONE) 10 MG tablet Take by mouth.   predniSONE (STERAPRED UNI-PAK 21 TAB) 10 MG (21) TBPK tablet Use as directed   No facility-administered encounter medications on file as of 12/27/2022.    Allergies (verified) Acetaminophen and Venofer [iron sucrose]   History: Past Medical History:  Diagnosis Date   Anemia    Asthma    childhood   Carotidynia    COPD (chronic obstructive pulmonary disease) (HCC)    DDD (degenerative disc disease), cervical    GERD (gastroesophageal reflux  disease)    Hypertension    Knee pain    left meniscus tear s/p repair 11/2014   Seizures (HCC)    last seizure was over 10 years ago, unknown etiology   Stuttering    patient reports this started after her knee surgery in 11/2014.    Thyroid nodule    Past Surgical History:  Procedure Laterality Date   ANTERIOR CERVICAL DECOMP/DISCECTOMY FUSION N/A 03/20/2019   Procedure: Anterior Cervical Decompression Fusion - Cervical Four-Cervical Five - Cervical Five-Cervical Six;  Surgeon: Tia Alert, MD;  Location: [facility] OR;  Service: Neurosurgery;   Laterality: N/A;  Anterior Cervical Decompression Fusion - Cervical Four-Cervical Five - Cervical Five-Cervical Six   BIOPSY  09/20/2020   Procedure: BIOPSY;  Surgeon: Corbin Ade, MD;  Location: [facility] SUITE;  Service: Endoscopy;;   COLONOSCOPY N/A 08/23/2016   Procedure: COLONOSCOPY;  Surgeon: Corbin Ade, MD;  Location: [facility];  Service: Endoscopy;  Laterality: N/A;  1:00pm   COLONOSCOPY N/A 08/24/2020   Diverticulosis in the entire examined colon. Redundant colon. Exam otherwise normal   ESOPHAGOGASTRODUODENOSCOPY N/A 05/03/2016   Procedure: ESOPHAGOGASTRODUODENOSCOPY (EGD);  Surgeon: Corbin Ade, MD;  Location: [facility];  Service: Endoscopy;  Laterality: N/A;   ESOPHAGOGASTRODUODENOSCOPY N/A 08/23/2016   Procedure: ESOPHAGOGASTRODUODENOSCOPY (EGD);  Surgeon: Corbin Ade, MD;  Location: [facility];  Service: Endoscopy;  Laterality: N/A;   ESOPHAGOGASTRODUODENOSCOPY (EGD) WITH PROPOFOL N/A 09/20/2020   normal esophagus. Large hiatal hernia. Multiple gastric erosions (Cameron lesions). Normal duodenal bulb and second portion of the duodenum. Suspicion Sheria Lang lesions causing intermittent bleeding   KNEE ARTHROSCOPY Right    KNEE ARTHROSCOPY WITH MEDIAL MENISECTOMY Left 12/09/2014   Procedure: KNEE ARTHROSCOPY WITH PARTIAL MEDIAL AND LATERAL MENISECTOMY;  Surgeon: Darreld Mclean, MD;  Location: [facility] ORS;  Service: Orthopedics;  Laterality: Left;   TUBAL LIGATION     WISDOM TOOTH EXTRACTION     Family History  Problem Relation Age of Onset   Hypertension Mother    Lung cancer Father    Hypertension Brother    Colon cancer Neg Hx    Gastric cancer Neg Hx    Esophageal cancer Neg Hx    Social History   Socioeconomic History   Marital status: Single    Spouse name: Not on file   Number of children: 1   Years of education: 12   Highest education level: Not on file  Occupational History   Occupation: Umemployed    Comment: Disability  Tobacco Use   Smoking  status: Never   Smokeless tobacco: Never  Vaping Use   Vaping status: Never Used  Substance and Sexual Activity   Alcohol use: No   Drug use: No   Sexual activity: Not Currently    Birth control/protection: Surgical  Other Topics Concern   Not on file  Social History Narrative   Lives at home alone.   Disabled - speech difficulties, cannot drive   Right-handed.   Occasional caffeine use.   Her daughter lives about 30-45 minutes away   She has lots of family nearby - uncle, mother, sister, brother   Social Determinants of Health   Financial Resource Strain: Low Risk  (12/27/2022)   Overall Financial Resource Strain (CARDIA)    Difficulty of Paying Living Expenses: Not hard at all  Food Insecurity: No Food Insecurity (12/27/2022)   Hunger Vital Sign    Worried About Running Out of Food in the Last Year: Never true  Ran Out of Food in the Last Year: Never true  Transportation Needs: No Transportation Needs (12/27/2022)   PRAPARE - Administrator, Civil Service (Medical): No    Lack of Transportation (Non-Medical): No  Physical Activity: Inactive (12/27/2022)   Exercise Vital Sign    Days of Exercise per Week: 0 days    Minutes of Exercise per Session: 0 min  Stress: No Stress Concern Present (12/27/2022)   [employer] of Occupational Health - Occupational Stress Questionnaire    Feeling of Stress : Not at all  Social Connections: Moderately Isolated (12/27/2022)   Social Connection and Isolation Panel [NHANES]    Frequency of Communication with Friends and Family: More than three times a week    Frequency of Social Gatherings with Friends and Family: More than three times a week    Attends Religious Services: More than 4 times per year    Active Member of [employer] or Organizations: No    Attends Engineer, structural: Never    Marital Status: Never married    Tobacco Counseling Counseling given: Not Answered   Clinical Intake:  Pre-visit  preparation completed: Yes  Pain : No/denies pain     Nutritional Risks: None Diabetes: No  How often do you need to have someone help you when you read instructions, pamphlets, or other written materials from your doctor or pharmacy?: 1 - Never  Interpreter Needed?: No  Information entered by :: Renie Ora, LPN   Activities of Daily Living    12/27/2022    2:05 PM  In your present state of health, do you have any difficulty performing the following activities:  Hearing? 0  Vision? 0  Difficulty concentrating or making decisions? 0  Walking or climbing stairs? 0  Dressing or bathing? 0  Doing errands, shopping? 0  Preparing Food and eating ? N  Using the Toilet? N  In the past six months, have you accidently leaked urine? N  Do you have problems with loss of bowel control? N  Managing your Medications? N  Managing your Finances? N  Housekeeping or managing your Housekeeping? N    Patient Care Team: [name], FNP as PCP - General (Nurse Practitioner) Jodelle Red, MD as PCP - Cardiology (Cardiology) Jena Gauss Gerrit Friends, MD as Consulting Physician (Gastroenterology) Gelene Mink, NP (Gastroenterology) Darolyn Rua as Physician Assistant (Oncology)  Indicate any recent Medical Services you may have received from other than [facility] providers in the past year (date may be approximate).     Assessment:   This is a routine wellness examination for Brittany Durham.  Hearing/Vision screen Vision Screening - Comments:: Wears rx glasses - up to date with routine eye exams with  Dr Dione Booze   Dietary issues and exercise activities discussed:     Goals Addressed             This Visit's Progress    DIET - EAT MORE FRUITS AND VEGETABLES   On track      Depression Screen    12/27/2022    2:04 PM 09/06/2022   10:22 AM 08/31/2022   11:30 AM 07/26/2022   10:54 AM 06/07/2022   11:39 AM 04/26/2022   10:17 AM 03/26/2022    8:50 AM  PHQ 2/9 Scores   PHQ - 2 Score 0 0 0 6 2 2 5   PHQ- 9 Score   0 18 8 4 15     Fall Risk    12/27/2022  2:03 PM 09/06/2022   10:22 AM 08/31/2022   11:30 AM 07/26/2022   10:54 AM 06/07/2022   11:39 AM  Fall Risk   Falls in the past year? 0 0 0 0 0  Number falls in past yr: 0   0   Injury with Fall? 0   0   Risk for fall due to : No Fall Risks   No Fall Risks   Follow up Falls prevention discussed   Falls evaluation completed     MEDICARE RISK AT HOME:  Medicare Risk at Home - 12/27/22 1403     Any stairs in or around the home? No    If so, are there any without handrails? No    Home free of loose throw rugs in walkways, pet beds, electrical cords, etc? Yes    Adequate lighting in your home to reduce risk of falls? Yes    Life alert? No    Use of a cane, walker or w/c? No    Grab bars in the bathroom? Yes    Shower chair or bench in shower? Yes    Elevated toilet seat or a handicapped toilet? Yes             TIMED UP AND GO:  Was the test performed?  No    Cognitive Function:        12/27/2022    2:05 PM 12/13/2020    3:55 PM 12/09/2019    1:37 PM 12/09/2019    1:29 PM 10/29/2018    3:39 PM  6CIT Screen  What Year? 0 points 0 points  0 points 0 points  What month? 0 points 0 points  0 points 0 points  What time? 0 points 0 points  0 points 0 points  Count back from 20 0 points 0 points  0 points 0 points  Months in reverse 0 points 0 points 2 points 0 points 0 points  Repeat phrase 0 points 0 points  0 points 0 points  Total Score 0 points 0 points  0 points 0 points    Immunizations Immunization History  Administered Date(s) Administered   Influenza,inj,Quad PF,6+ Mos 03/06/2022   Moderna Sars-Covid-2 Vaccination 07/25/2019, 08/26/2019   Tdap 08/10/2021   Zoster Recombinant(Shingrix) 03/26/2022    TDAP status: Up to date  Flu Vaccine status: Up to date  Pneumococcal vaccine status: Up to date  Covid-19 vaccine status: Completed vaccines  Qualifies for Shingles  Vaccine? Yes   Zostavax completed Yes   Shingrix Completed?: Yes  Screening Tests Health Maintenance  Topic Date Due   COVID-19 Vaccine (3 - Moderna risk series) 09/23/2019   Zoster Vaccines- Shingrix (2 of 2) 05/21/2022   INFLUENZA VACCINE  01/28/2023 (Originally 12/13/2022)   MAMMOGRAM  03/21/2023   Medicare Annual Wellness (AWV)  12/27/2023   PAP SMEAR-Modifier  04/26/2025   DTaP/Tdap/Td (2 - Td or Tdap) 08/11/2031   Colonoscopy  03/30/2032   Hepatitis C Screening  Completed   HIV Screening  Completed   HPV VACCINES  Aged Out    Health Maintenance  Health Maintenance Due  Topic Date Due   COVID-19 Vaccine (3 - Moderna risk series) 09/23/2019   Zoster Vaccines- Shingrix (2 of 2) 05/21/2022    Colorectal cancer screening: Type of screening: Colonoscopy. Completed 03/30/2022. Repeat every 10 years  Mammogram status: Ordered 12/26/2022. Pt provided with contact info and advised to call to schedule appt.   Bone Density status: Ordered not of age . Pt provided  with contact info and advised to call to schedule appt.  Lung Cancer Screening: (Low Dose CT Chest recommended if Age 27-80 years, 20 pack-year currently smoking OR have quit w/in 15years.) does not qualify.   Lung Cancer Screening Referral: n/a  Additional Screening:  Hepatitis C Screening: does not qualify; Completed 01/14/2020  Vision Screening: Recommended annual ophthalmology exams for early detection of glaucoma and other disorders of the eye. Is the patient up to date with their annual eye exam?  Yes  Who is the provider or what is the name of the office in which the patient attends annual eye exams? [email or web address]  If pt is not established with a provider, would they like to be referred to a provider to establish care? No .   Dental Screening: Recommended annual dental exams for proper oral hygiene    Community Resource Referral / Chronic Care Management: CRR required this visit?  No   CCM required this visit?   No     Plan:     I have personally reviewed and noted the following in the patient's chart:   Medical and social history Use of alcohol, tobacco or illicit drugs  Current medications and supplements including opioid prescriptions. Patient is not currently taking opioid prescriptions. Functional ability and status Nutritional status Physical activity Advanced directives List of other physicians Hospitalizations, surgeries, and ER visits in previous 12 months Vitals Screenings to include cognitive, depression, and falls Referrals and appointments  In addition, I have reviewed and discussed with patient certain preventive protocols, quality metrics, and best practice recommendations. A written personalized care plan for preventive services as well as general preventive health recommendations were provided to patient.     Lorrene Reid, LPN   [id number]   After Visit Summary: (MyChart) Due to this being a telephonic visit, the after visit summary with patients personalized plan was offered to patient via MyChart   Nurse Notes: Due pneumonia Vaccine

## 2022-12-27 NOTE — Patient Instructions (Signed)
Brittany Durham , Thank you for taking time to come for your Medicare Wellness Visit. I appreciate your ongoing commitment to your health goals. Please review the following plan we discussed and let me know if I can assist you in the future.   Referrals/Orders/Follow-Ups/Clinician Recommendations: Aim for 30 minutes of exercise or brisk walking, 6-8 glasses of water, and 5 servings of fruits and vegetables each day.   This is a list of the screening recommended for you and due dates:  Health Maintenance  Topic Date Due   COVID-19 Vaccine (3 - Moderna risk series) 09/23/2019   Zoster (Shingles) Vaccine (2 of 2) 05/21/2022   Flu Shot  01/28/2023*   Mammogram  03/21/2023   Medicare Annual Wellness Visit  12/27/2023   Pap Smear  04/26/2025   DTaP/Tdap/Td vaccine (2 - Td or Tdap) 08/11/2031   Colon Cancer Screening  03/30/2032   Hepatitis C Screening  Completed   HIV Screening  Completed   HPV Vaccine  Aged Out  *Topic was postponed. The date shown is not the original due date.    Advanced directives: (Provided) Advance directive discussed with you today. I have provided a copy for you to complete at home and have notarized. Once this is complete, please bring a copy in to our office so we can scan it into your chart. Information on Advanced Care Planning can be found at [facility] Advance Health Care Directives Advance Health Care Directives ([email or web address]    Next Medicare Annual Wellness Visit scheduled for next year: Yes  Preventive Care 40-64 Years, Female Preventive care refers to lifestyle choices and visits with your health care provider that can promote health and wellness. What does preventive care include? A yearly physical exam. This is also called an annual well check. Dental exams once or twice a year. Routine eye exams. Ask your health care provider how often you should have your eyes checked. Personal lifestyle choices, including: Daily care of your teeth and  gums. Regular physical activity. Eating a healthy diet. Avoiding tobacco and drug use. Limiting alcohol use. Practicing safe sex. Taking low-dose aspirin daily starting at age 31. Taking vitamin and mineral supplements as recommended by your health care provider. What happens during an annual well check? The services and screenings done by your health care provider during your annual well check will depend on your age, overall health, lifestyle risk factors, and family history of disease. Counseling  Your health care provider may ask you questions about your: Alcohol use. Tobacco use. Drug use. Emotional well-being. Home and relationship well-being. Sexual activity. Eating habits. Work and work Astronomer. Method of birth control. Menstrual cycle. Pregnancy history. Screening  You may have the following tests or measurements: Height, weight, and BMI. Blood pressure. Lipid and cholesterol levels. These may be checked every 5 years, or more frequently if you are over 7 years old. Skin check. Lung cancer screening. You may have this screening every year starting at age 9 if you have a 30-pack-year history of smoking and currently smoke or have quit within the past 15 years. Fecal occult blood test (FOBT) of the stool. You may have this test every year starting at age 33. Flexible sigmoidoscopy or colonoscopy. You may have a sigmoidoscopy every 5 years or a colonoscopy every 10 years starting at age 72. Hepatitis C blood test. Hepatitis B blood test. Sexually transmitted disease (STD) testing. Diabetes screening. This is done by checking your blood sugar (glucose) after you have not  eaten for a while (fasting). You may have this done every 1-3 years. Mammogram. This may be done every 1-2 years. Talk to your health care provider about when you should start having regular mammograms. This may depend on whether you have a family history of breast cancer. BRCA-related cancer  screening. This may be done if you have a family history of breast, ovarian, tubal, or peritoneal cancers. Pelvic exam and Pap test. This may be done every 3 years starting at age 40. Starting at age 34, this may be done every 5 years if you have a Pap test in combination with an HPV test. Bone density scan. This is done to screen for osteoporosis. You may have this scan if you are at high risk for osteoporosis. Discuss your test results, treatment options, and if necessary, the need for more tests with your health care provider. Vaccines  Your health care provider may recommend certain vaccines, such as: Influenza vaccine. This is recommended every year. Tetanus, diphtheria, and acellular pertussis (Tdap, Td) vaccine. You may need a Td booster every 10 years. Zoster vaccine. You may need this after age 30. Pneumococcal 13-valent conjugate (PCV13) vaccine. You may need this if you have certain conditions and were not previously vaccinated. Pneumococcal polysaccharide (PPSV23) vaccine. You may need one or two doses if you smoke cigarettes or if you have certain conditions. Talk to your health care provider about which screenings and vaccines you need and how often you need them. This information is not intended to replace advice given to you by your health care provider. Make sure you discuss any questions you have with your health care provider. Document Released: 05/27/2015 Document Revised: 01/18/2016 Document Reviewed: 03/01/2015 Elsevier Interactive Patient Education  2017 [employer].    Fall Prevention in the Home Falls can cause injuries. They can happen to people of all ages. There are many things you can do to make your home safe and to help prevent falls. What can I do on the outside of my home? Regularly fix the edges of walkways and driveways and fix any cracks. Remove anything that might make you trip as you walk through a door, such as a raised step or threshold. Trim any  bushes or trees on the path to your home. Use bright outdoor lighting. Clear any walking paths of anything that might make someone trip, such as rocks or tools. Regularly check to see if handrails are loose or broken. Make sure that both sides of any steps have handrails. Any raised decks and porches should have guardrails on the edges. Have any leaves, snow, or ice cleared regularly. Use sand or salt on walking paths during winter. Clean up any spills in your garage right away. This includes oil or grease spills. What can I do in the bathroom? Use night lights. Install grab bars by the toilet and in the tub and shower. Do not use towel bars as grab bars. Use non-skid mats or decals in the tub or shower. If you need to sit down in the shower, use a plastic, non-slip stool. Keep the floor dry. Clean up any water that spills on the floor as soon as it happens. Remove soap buildup in the tub or shower regularly. Attach bath mats securely with double-sided non-slip rug tape. Do not have throw rugs and other things on the floor that can make you trip. What can I do in the bedroom? Use night lights. Make sure that you have a light by your bed  that is easy to reach. Do not use any sheets or blankets that are too big for your bed. They should not hang down onto the floor. Have a firm chair that has side arms. You can use this for support while you get dressed. Do not have throw rugs and other things on the floor that can make you trip. What can I do in the kitchen? Clean up any spills right away. Avoid walking on wet floors. Keep items that you use a lot in easy-to-reach places. If you need to reach something above you, use a strong step stool that has a grab bar. Keep electrical cords out of the way. Do not use floor polish or wax that makes floors slippery. If you must use wax, use non-skid floor wax. Do not have throw rugs and other things on the floor that can make you trip. What can I do  with my stairs? Do not leave any items on the stairs. Make sure that there are handrails on both sides of the stairs and use them. Fix handrails that are broken or loose. Make sure that handrails are as long as the stairways. Check any carpeting to make sure that it is firmly attached to the stairs. Fix any carpet that is loose or worn. Avoid having throw rugs at the top or bottom of the stairs. If you do have throw rugs, attach them to the floor with carpet tape. Make sure that you have a light switch at the top of the stairs and the bottom of the stairs. If you do not have them, ask someone to add them for you. What else can I do to help prevent falls? Wear shoes that: Do not have high heels. Have rubber bottoms. Are comfortable and fit you well. Are closed at the toe. Do not wear sandals. If you use a stepladder: Make sure that it is fully opened. Do not climb a closed stepladder. Make sure that both sides of the stepladder are locked into place. Ask someone to hold it for you, if possible. Clearly mark and make sure that you can see: Any grab bars or handrails. First and last steps. Where the edge of each step is. Use tools that help you move around (mobility aids) if they are needed. These include: Canes. Walkers. Scooters. Crutches. Turn on the lights when you go into a dark area. Replace any light bulbs as soon as they burn out. Set up your furniture so you have a clear path. Avoid moving your furniture around. If any of your floors are uneven, fix them. If there are any pets around you, be aware of where they are. Review your medicines with your doctor. Some medicines can make you feel dizzy. This can increase your chance of falling. Ask your doctor what other things that you can do to help prevent falls. This information is not intended to replace advice given to you by your health care provider. Make sure you discuss any questions you have with your health care  provider. Document Released: 02/24/2009 Document Revised: 10/06/2015 Document Reviewed: 06/04/2014 Elsevier Interactive Patient Education  2017 [employer].

## 2022-12-28 ENCOUNTER — Inpatient Hospital Stay: Payer: 59

## 2022-12-28 VITALS — BP 130/74 | HR 80 | Temp 97.7°F | Resp 18

## 2022-12-28 DIAGNOSIS — D62 Acute posthemorrhagic anemia: Secondary | ICD-10-CM

## 2022-12-28 DIAGNOSIS — D500 Iron deficiency anemia secondary to blood loss (chronic): Secondary | ICD-10-CM

## 2022-12-28 DIAGNOSIS — D509 Iron deficiency anemia, unspecified: Secondary | ICD-10-CM | POA: Diagnosis not present

## 2022-12-28 MED ORDER — SODIUM CHLORIDE 0.9 % IV SOLN
510.0000 mg | Freq: Once | INTRAVENOUS | Status: AC
  Administered 2022-12-28: 510 mg via INTRAVENOUS
  Filled 2022-12-28: qty 510

## 2022-12-28 MED ORDER — FAMOTIDINE IN NACL 20-0.9 MG/50ML-% IV SOLN
20.0000 mg | Freq: Once | INTRAVENOUS | Status: AC
  Administered 2022-12-28: 20 mg via INTRAVENOUS
  Filled 2022-12-28: qty 50

## 2022-12-28 MED ORDER — SODIUM CHLORIDE 0.9 % IV SOLN: Freq: Once | INTRAVENOUS | Status: AC

## 2022-12-28 MED ORDER — CETIRIZINE HCL 10 MG/ML IV SOLN
10.0000 mg | Freq: Once | INTRAVENOUS | Status: AC
  Administered 2022-12-28: 10 mg via INTRAVENOUS
  Filled 2022-12-28: qty 1

## 2022-12-28 MED ORDER — METHYLPREDNISOLONE SODIUM SUCC 125 MG IJ SOLR
125.0000 mg | Freq: Once | INTRAMUSCULAR | Status: AC
  Administered 2022-12-28: 125 mg via INTRAVENOUS
  Filled 2022-12-28: qty 2

## 2022-12-28 NOTE — Progress Notes (Signed)
Patient presents today for iron infusion of Feraheme.  Patient is in satisfactory condition with no new complaints voiced.  Vital signs are stable.  We will proceed with infusion per provider orders.    Patient tolerated treatment well with no complaints voiced.  Patient left ambulatory in stable condition.  Vital signs stable at discharge.  Follow up as scheduled.    

## 2022-12-28 NOTE — Patient Instructions (Signed)
[facility]  Discharge Instructions: Thank you for choosing  Cancer Center to provide your oncology and hematology care.  If you have a lab appointment with the Cancer Center - please note that after April 8th, 2024, all labs will be drawn in the cancer center.  You do not have to check in or register with the main entrance as you have in the past but will complete your check-in in the cancer center.  Wear comfortable clothing and clothing appropriate for easy access to any Portacath or PICC line.   We strive to give you quality time with your provider. You may need to reschedule your appointment if you arrive late (15 or more minutes).  Arriving late affects you and other patients whose appointments are after yours.  Also, if you miss three or more appointments without notifying the office, you may be dismissed from the clinic at the provider's discretion.      For prescription refill requests, have your pharmacy contact our office and allow 72 hours for refills to be completed.    Today you received the following Feraheme infusion.  Ferumoxytol Injection What is this medication? FERUMOXYTOL (FER ue MOX i tol) treats low levels of iron in your body (iron deficiency anemia). Iron is a mineral that plays an important role in making red blood cells, which carry oxygen from your lungs to the rest of your body. This medicine may be used for other purposes; ask your health care provider or pharmacist if you have questions. COMMON BRAND NAME(S): Feraheme What should I tell my care team before I take this medication? They need to know if you have any of these conditions: Anemia not caused by low iron levels High levels of iron in the blood Magnetic resonance imaging (MRI) test scheduled An unusual or allergic reaction to iron, other medications, foods, dyes, or preservatives Pregnant or trying to get pregnant Breastfeeding How should I use this medication? This  medication is injected into a vein. It is given by your care team in a hospital or clinic setting. Talk to your care team the use of this medication in children. Special care may be needed. Overdosage: If you think you have taken too much of this medicine contact a poison control center or emergency room at once. NOTE: This medicine is only for you. Do not share this medicine with others. What if I miss a dose? It is important not to miss your dose. Call your care team if you are unable to keep an appointment. What may interact with this medication? Other iron products This list may not describe all possible interactions. Give your health care provider a list of all the medicines, herbs, non-prescription drugs, or dietary supplements you use. Also tell them if you smoke, drink alcohol, or use illegal drugs. Some items may interact with your medicine. What should I watch for while using this medication? Visit your care team regularly. Tell your care team if your symptoms do not start to get better or if they get worse. You may need blood work done while you are taking this medication. You may need to follow a special diet. Talk to your care team. Foods that contain iron include: whole grains/cereals, dried fruits, beans, or peas, leafy green vegetables, and organ meats (liver, kidney). What side effects may I notice from receiving this medication? Side effects that you should report to your care team as soon as possible: Allergic reactions--skin rash, itching, hives, swelling of the  face, lips, tongue, or throat Low blood pressure--dizziness, feeling faint or lightheaded, blurry vision Shortness of breath Side effects that usually do not require medical attention (report to your care team if they continue or are bothersome): Flushing Headache Joint pain Muscle pain Nausea Pain, redness, or irritation at injection site This list may not describe all possible side effects. Call your doctor for  medical advice about side effects. You may report side effects to FDA at 1-800-FDA-1088. Where should I keep my medication? This medication is given in a hospital or clinic. It will not be stored at home. NOTE: This sheet is a summary. It may not cover all possible information. If you have questions about this medicine, talk to your doctor, pharmacist, or health care provider.  2024 Elsevier/Gold Standard (2022-10-05 00:00:00)   To help prevent nausea and vomiting after your treatment, we encourage you to take your nausea medication as directed.  BELOW ARE SYMPTOMS THAT SHOULD BE REPORTED IMMEDIATELY: *FEVER GREATER THAN 100.4 F (38 C) OR HIGHER *CHILLS OR SWEATING *NAUSEA AND VOMITING THAT IS NOT CONTROLLED WITH YOUR NAUSEA MEDICATION *UNUSUAL SHORTNESS OF BREATH *UNUSUAL BRUISING OR BLEEDING *URINARY PROBLEMS (pain or burning when urinating, or frequent urination) *BOWEL PROBLEMS (unusual diarrhea, constipation, pain near the anus) TENDERNESS IN MOUTH AND THROAT WITH OR WITHOUT PRESENCE OF ULCERS (sore throat, sores in mouth, or a toothache) UNUSUAL RASH, SWELLING OR PAIN  UNUSUAL VAGINAL DISCHARGE OR ITCHING   Items with * indicate a potential emergency and should be followed up as soon as possible or go to the Emergency Department if any problems should occur.  Please show the CHEMOTHERAPY ALERT CARD or IMMUNOTHERAPY ALERT CARD at check-in to the Emergency Department and triage nurse.  Should you have questions after your visit or need to cancel or reschedule your appointment, please contact [facility] [phone]  and follow the prompts.  Office hours are 8:00 a.m. to 4:30 p.m. Monday - Friday. Please note that voicemails left after 4:00 p.m. may not be returned until the following business day.  We are closed weekends and major holidays. You have access to a nurse at all times for urgent questions. Please call the main number to the clinic [phone] and  follow the prompts.  For any non-urgent questions, you may also contact your provider using MyChart. We now offer e-Visits for anyone 29 and older to request care online for non-urgent symptoms. For details visit [email or web address].   Also download the MyChart app! Go to the app store, search "MyChart", open the app, select [facility], and log in with your MyChart username and password.

## 2023-01-29 ENCOUNTER — Other Ambulatory Visit: Payer: Self-pay | Admitting: Family

## 2023-01-29 DIAGNOSIS — F411 Generalized anxiety disorder: Secondary | ICD-10-CM

## 2023-01-29 DIAGNOSIS — F321 Major depressive disorder, single episode, moderate: Secondary | ICD-10-CM

## 2023-01-31 ENCOUNTER — Ambulatory Visit: Payer: 59 | Admitting: Orthopaedic Surgery

## 2023-02-06 ENCOUNTER — Inpatient Hospital Stay: Payer: 59 | Attending: Hematology

## 2023-02-06 DIAGNOSIS — D509 Iron deficiency anemia, unspecified: Secondary | ICD-10-CM | POA: Insufficient documentation

## 2023-02-06 DIAGNOSIS — D500 Iron deficiency anemia secondary to blood loss (chronic): Secondary | ICD-10-CM

## 2023-02-06 LAB — CBC WITH DIFFERENTIAL/PLATELET
Abs Immature Granulocytes: 0.03 10*3/uL (ref 0.00–0.07)
Basophils Absolute: 0 10*3/uL (ref 0.0–0.1)
Basophils Relative: 0 %
Eosinophils Absolute: 0.2 10*3/uL (ref 0.0–0.5)
Eosinophils Relative: 3 %
HCT: 32.6 % — ABNORMAL LOW (ref 36.0–46.0)
Hemoglobin: 10.3 g/dL — ABNORMAL LOW (ref 12.0–15.0)
Immature Granulocytes: 0 %
Lymphocytes Relative: 25 %
Lymphs Abs: 1.8 10*3/uL (ref 0.7–4.0)
MCH: 28.6 pg (ref 26.0–34.0)
MCHC: 31.6 g/dL (ref 30.0–36.0)
MCV: 90.6 fL (ref 80.0–100.0)
Monocytes Absolute: 0.7 10*3/uL (ref 0.1–1.0)
Monocytes Relative: 9 %
Neutro Abs: 4.4 10*3/uL (ref 1.7–7.7)
Neutrophils Relative %: 63 %
Platelets: 238 10*3/uL (ref 150–400)
RBC: 3.6 MIL/uL — ABNORMAL LOW (ref 3.87–5.11)
RDW: 14.9 % (ref 11.5–15.5)
WBC: 7.1 10*3/uL (ref 4.0–10.5)
nRBC: 0 % (ref 0.0–0.2)

## 2023-02-06 LAB — IRON AND TIBC
Iron: 41 ug/dL (ref 28–170)
Saturation Ratios: 10 % — ABNORMAL LOW (ref 10.4–31.8)
TIBC: 417 ug/dL (ref 250–450)
UIBC: 376 ug/dL

## 2023-02-06 LAB — FERRITIN: Ferritin: 14 ng/mL (ref 11–307)

## 2023-02-07 ENCOUNTER — Ambulatory Visit: Payer: 59 | Admitting: Orthopaedic Surgery

## 2023-02-07 DIAGNOSIS — M5126 Other intervertebral disc displacement, lumbar region: Secondary | ICD-10-CM

## 2023-02-07 DIAGNOSIS — M545 Low back pain, unspecified: Secondary | ICD-10-CM

## 2023-02-07 NOTE — Progress Notes (Signed)
Office Visit Note   Patient: Brittany Durham           Date of Birth: 03-28-1971           MRN: [id number] Visit Date: 02/07/2023              Requested by: [name], FNP [address] [place],  [place] [address] PCP: [name], FNP   Assessment & Plan: Visit Diagnoses:  1. Lumbar spine pain   2. Protrusion of lumbar intervertebral disc     Plan: Will set patient up for single epidural injection lumbar for her L4-5 disc protrusion without central stenosis.  She can follow-up with me if she is having ongoing symptoms postinjection.  Follow-Up Instructions: No follow-ups on file.   Orders:  Orders Placed This Encounter  Procedures   Ambulatory referral to Physical Medicine Rehab   No orders of the defined types were placed in this encounter.     Procedures: No procedures performed   Clinical Data: No additional findings.   Subjective: No chief complaint on file.   She states at times her pain is severe.  She has been through physical therapy anti-inflammatories, prednisone Dosepak, muscle relaxant with persistent symptoms.  Patient been dealing with chronic anemia has been getting some iron infusions.  MRI showed spotty bone changes consistent with anemia she is is a non-smoker.  Review of Systems all other systems updated unchanged from previous visit.   Objective: Vital Signs: LMP 09/16/2017   Physical Exam Constitutional:      Appearance: She is well-developed.  HENT:     Head: Normocephalic.     Right Ear: External ear normal.     Left Ear: External ear normal. There is no impacted cerumen.  Eyes:     Pupils: Pupils are equal, round, and reactive to light.  Neck:     Thyroid: No thyromegaly.     Trachea: No tracheal deviation.  Cardiovascular:     Rate and Rhythm: Normal rate.  Pulmonary:     Effort: Pulmonary effort is normal.  Abdominal:     Palpations: Abdomen is soft.  Musculoskeletal:     Cervical back: No rigidity.   Skin:    General: Skin is warm and dry.  Neurological:     Mental Status: She is alert and oriented to person, place, and time.  Psychiatric:        Behavior: Behavior normal.   speech impediment stuttering.  Ortho Exam patient gets from sitting to standing she has discomfort turning twisting forward bending.  Some pain with straight raising at 90 degrees.  Specialty Comments:  No specialty comments available.  Imaging: No results found.   PMFS History: Patient Active Problem List   Diagnosis Date Noted   Protrusion of lumbar intervertebral disc 11/29/2022   Biliary dyskinesia 02/01/2021   Right flank pain 10/27/2020   Iron deficiency anemia due to chronic blood loss 09/15/2020   Symptomatic anemia 07/20/2020   Depression, major, single episode, moderate (HCC) 07/15/2020   Vertigo 01/26/2020   GAD (generalized anxiety disorder) 07/28/2019   Radiculopathy, cervical region 07/16/2019   S/P cervical spinal fusion 03/20/2019   Osteoarthritis 11/18/2017   Insomnia 11/18/2017   Synovitis, villonodular, knee, right 10/28/2017   Tear of lateral meniscus of right knee, current 10/28/2017   Chondromalacia patellae, 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   Language difficulty 09/25/2016   Nausea with vomiting 07/18/2016   PUD (peptic ulcer disease) 07/18/2016   Slurred speech  GI bleed 05/02/2016   Sinus tachycardia 05/02/2016   Acute blood loss anemia 05/02/2016   Speech disorder 03/31/2015   Thyroid nodule 12/14/2014   Left knee pain 12/14/2014   Seizures (HCC)    Vitamin D deficiency 06/30/2014   Gastroesophageal reflux disease without esophagitis 06/29/2014   SYNCOPE 03/02/2010   HYPERTENSION, BENIGN 03/01/2010   Past Medical History:  Diagnosis Date   Anemia    Asthma    childhood   Carotidynia    COPD (chronic obstructive pulmonary disease) (HCC)    DDD (degenerative disc disease), cervical    GERD (gastroesophageal reflux disease)    Hypertension    Knee pain    left  meniscus tear s/p repair 11/2014   Seizures (HCC)    last seizure was over 10 years ago, unknown etiology   Stuttering    patient reports this started after her knee surgery in 11/2014.    Thyroid nodule     Family History  Problem Relation Age of Onset   Hypertension Mother    Lung cancer Father    Hypertension Brother    Colon cancer Neg Hx    Gastric cancer Neg Hx    Esophageal cancer Neg Hx     Past Surgical History:  Procedure Laterality Date   ANTERIOR CERVICAL DECOMP/DISCECTOMY FUSION N/A 03/20/2019   Procedure: Anterior Cervical Decompression Fusion - Cervical Four-Cervical Five - Cervical Five-Cervical Six;  Surgeon: Tia Alert, MD;  Location: [facility] OR;  Service: Neurosurgery;  Laterality: N/A;  Anterior Cervical Decompression Fusion - Cervical Four-Cervical Five - Cervical Five-Cervical Six   BIOPSY  09/20/2020   Procedure: BIOPSY;  Surgeon: Corbin Ade, MD;  Location: [facility];  Service: Endoscopy;;   COLONOSCOPY N/A 08/23/2016   Procedure: COLONOSCOPY;  Surgeon: Corbin Ade, MD;  Location: [facility];  Service: Endoscopy;  Laterality: N/A;  1:00pm   COLONOSCOPY N/A 08/24/2020   Diverticulosis in the entire examined colon. Redundant colon. Exam otherwise normal   ESOPHAGOGASTRODUODENOSCOPY N/A 05/03/2016   Procedure: ESOPHAGOGASTRODUODENOSCOPY (EGD);  Surgeon: Corbin Ade, MD;  Location: [facility];  Service: Endoscopy;  Laterality: N/A;   ESOPHAGOGASTRODUODENOSCOPY N/A 08/23/2016   Procedure: ESOPHAGOGASTRODUODENOSCOPY (EGD);  Surgeon: Corbin Ade, MD;  Location: [facility];  Service: Endoscopy;  Laterality: N/A;   ESOPHAGOGASTRODUODENOSCOPY (EGD) WITH PROPOFOL N/A 09/20/2020   normal esophagus. Large hiatal hernia. Multiple gastric erosions (Cameron lesions). Normal duodenal bulb and second portion of the duodenum. Suspicion Sheria Lang lesions causing intermittent bleeding   KNEE ARTHROSCOPY Right    KNEE ARTHROSCOPY WITH MEDIAL MENISECTOMY Left  12/09/2014   Procedure: KNEE ARTHROSCOPY WITH PARTIAL MEDIAL AND LATERAL MENISECTOMY;  Surgeon: Darreld Mclean, MD;  Location: [facility];  Service: Orthopedics;  Laterality: Left;   TUBAL LIGATION     WISDOM TOOTH EXTRACTION     Social History   Occupational History   Occupation: U[employer]ployed    Comment: Disability  Tobacco Use   Smoking status: Never   Smokeless tobacco: Never  Vaping Use   Vaping status: Never Used  Substance and Sexual Activity   Alcohol use: No   Drug use: No   Sexual activity: Not Currently    Birth control/protection: Surgical

## 2023-02-12 ENCOUNTER — Telehealth: Payer: Self-pay | Admitting: Physical Medicine and Rehabilitation

## 2023-02-12 NOTE — Progress Notes (Unsigned)
[facility] [address], [place] [address]   CLINIC:  Medical Oncology/Hematology  PCP:  [name], FNP [address] [place] [address] [phone]   REASON FOR VISIT:  Follow-up for iron deficiency anemia   CURRENT THERAPY: Intermittent IV iron  INTERVAL HISTORY:   Brittany Durham 52 y.o. female returns for routine follow-up of her iron deficiency anemia.  She was last seen by Rojelio Brenner PA-C on 12/06/2022.     At today's visit, she reports feeling poorly due to back pain. *** *** She received IV Feraheme x 3 in July/August 2024, and reports feeling transient increase in energy after IV iron.  ** *** She had recurrent fatigue after a few weeks.  *** No pica, chest pain, palpitations, dyspnea, lightheadedness, or syncope.  She has not noticed any recent rectal bleeding or melena since her colonoscopy in November 2023.  *** ***She has not had any further vaginal bleeding since her isolated episode in late 2022.  She has 50***% energy and 50***% appetite. She endorses that she is maintaining a stable weight.  ASSESSMENT & PLAN:  1.  Iron deficiency anemia with suspected occult GI bleed - ED on 07/18/2020 with Hgb 5.7, fatigue, lightheadedness.  Denied overt GI bleeding and was Hemoccult-negative, but does have a history of peptic ulcer disease.  She was transfused 2 units PRBCs on 07/18/2020, and followed up with gastroenterology.  - Colonoscopy (08/24/2020) showed diverticulosis, but was otherwise normal. - EGD (09/20/2020): Large hiatal hernia, Cameron lesions and antral erosions (suspicious for causing intermittent bleeding in the setting of ongoing NSAID use per GI note) - EGD and colonoscopy at [facility] on 03/30/2022, which showed multiple scattered pancolonic diverticula of moderate severity otherwise normal colonoscopy; EGD was normal  - She is on Protonix for peptic ulcer disease - Hematology work-up (07/29/2020) showed  Hgb 8.5 (microcytic with MCV 78.2).  Ferritin was 7, with 32% iron saturation.  Reticulocytes 3.4 (reticulocyte index 1.61, consistent with hypoproliferation in the setting of iron deficiency).   - Most recent IV iron with Feraheme x3 in July/August 2024 - Per outside medical records, patient had mild postinfusion reaction with rash on bilateral arms after Venofer infusion on 09/29/2021.  Tolerated Feraheme well with PREMEDICATION. - Intermittent rectal bleeding. *** No recent signs of melena.*** - Follows with [facility] Berna Bue PA-C & Dr. Briant Sites) *** - Symptomatic with fatigue*** - Most recent labs (02/06/2023): Hgb 10.3/MCV 90.6.  Ferritin 14, iron saturation 10%, TIBC 417.  Labs from October 2023 showed CMP unremarkable, LDH normal; normal B12, MMA, folate, SPEP, haptoglobin. - PLAN: Recommend additional IV Feraheme x2 - PREMEDICATE WITH STEROIDS due to possible reaction after most recent Venofer. - Continue GI and GYN follow-up.  - RTC in 2 months for repeat CBC and iron panel followed by office visit  PLAN SUMMARY: >> Feraheme x 2 >> Labs in 2 months = CBC/D, ferritin, iron/TIBC >> OFFICE visit in 2 months ***   REVIEW OF SYSTEMS:***  Review of Systems  Constitutional:  Positive for fatigue. Negative for appetite change, chills, diaphoresis, fever and unexpected weight change.  HENT:   Negative for lump/mass and nosebleeds.   Eyes:  Negative for eye problems.  Respiratory:  Negative for cough, hemoptysis and shortness of breath.   Cardiovascular:  Negative for chest pain, leg swelling and palpitations.  Gastrointestinal:  Negative for abdominal pain, blood in stool, constipation, diarrhea, nausea and vomiting.  Genitourinary:  Negative  for hematuria.   Musculoskeletal:  Positive for back pain.  Skin: Negative.   Neurological:  Negative for dizziness, headaches, light-headedness and numbness.  Hematological:  Does not bruise/bleed easily.  Psychiatric/Behavioral:   Positive for depression and sleep disturbance. The patient is nervous/anxious.      PHYSICAL EXAM:  ECOG PERFORMANCE STATUS: 1 - Symptomatic but completely ambulatory *** There were no vitals filed for this visit. There were no vitals filed for this visit. Physical Exam Constitutional:      Appearance: Normal appearance. She is obese.  HENT:     Head: Normocephalic and atraumatic.     Mouth/Throat:     Mouth: Mucous membranes are moist.  Eyes:     Extraocular Movements: Extraocular movements intact.     Pupils: Pupils are equal, round, and reactive to light.  Cardiovascular:     Rate and Rhythm: Regular rhythm.     Pulses: Normal pulses.     Heart sounds: Normal heart sounds.  Pulmonary:     Effort: Pulmonary effort is normal.     Breath sounds: Normal breath sounds.  Abdominal:     General: Bowel sounds are normal.     Palpations: Abdomen is soft.     Tenderness: There is no abdominal tenderness.  Musculoskeletal:        General: No swelling.     Right lower leg: No edema.     Left lower leg: No edema.  Lymphadenopathy:     Cervical: No cervical adenopathy.  Skin:    General: Skin is warm and dry.  Neurological:     General: No focal deficit present.     Mental Status: She is alert and oriented to person, place, and time.  Psychiatric:        Mood and Affect: Mood normal.        Behavior: Behavior normal.     PAST MEDICAL/SURGICAL HISTORY:  Past Medical History:  Diagnosis Date   Anemia    Asthma    childhood   Carotidynia    COPD (chronic obstructive pulmonary disease) (HCC)    DDD (degenerative disc disease), cervical    GERD (gastroesophageal reflux disease)    Hypertension    Knee pain    left meniscus tear s/p repair 11/2014   Seizures (HCC)    last seizure was over 10 years ago, unknown etiology   Stuttering    patient reports this started after her knee surgery in 11/2014.    Thyroid nodule    Past Surgical History:  Procedure Laterality Date    ANTERIOR CERVICAL DECOMP/DISCECTOMY FUSION N/A 03/20/2019   Procedure: Anterior Cervical Decompression Fusion - Cervical Four-Cervical Five - Cervical Five-Cervical Six;  Surgeon: Tia Alert, MD;  Location: [facility] OR;  Service: Neurosurgery;  Laterality: N/A;  Anterior Cervical Decompression Fusion - Cervical Four-Cervical Five - Cervical Five-Cervical Six   BIOPSY  09/20/2020   Procedure: BIOPSY;  Surgeon: Corbin Ade, MD;  Location: [facility];  Service: Endoscopy;;   COLONOSCOPY N/A 08/23/2016   Procedure: COLONOSCOPY;  Surgeon: Corbin Ade, MD;  Location: [facility];  Service: Endoscopy;  Laterality: N/A;  1:00pm   COLONOSCOPY N/A 08/24/2020   Diverticulosis in the entire examined colon. Redundant colon. Exam otherwise normal   ESOPHAGOGASTRODUODENOSCOPY N/A 05/03/2016   Procedure: ESOPHAGOGASTRODUODENOSCOPY (EGD);  Surgeon: Corbin Ade, MD;  Location: [facility];  Service: Endoscopy;  Laterality: N/A;   ESOPHAGOGASTRODUODENOSCOPY N/A 08/23/2016   Procedure: ESOPHAGOGASTRODUODENOSCOPY (EGD);  Surgeon: Corbin Ade,  MD;  Location: [facility];  Service: Endoscopy;  Laterality: N/A;   ESOPHAGOGASTRODUODENOSCOPY (EGD) WITH PROPOFOL N/A 09/20/2020   normal esophagus. Large hiatal hernia. Multiple gastric erosions (Cameron lesions). Normal duodenal bulb and second portion of the duodenum. Suspicion Sheria Lang lesions causing intermittent bleeding   KNEE ARTHROSCOPY Right    KNEE ARTHROSCOPY WITH MEDIAL MENISECTOMY Left 12/09/2014   Procedure: KNEE ARTHROSCOPY WITH PARTIAL MEDIAL AND LATERAL MENISECTOMY;  Surgeon: Darreld Mclean, MD;  Location: [facility];  Service: Orthopedics;  Laterality: Left;   TUBAL LIGATION     WISDOM TOOTH EXTRACTION      SOCIAL HISTORY:  Social History   Socioeconomic History   Marital status: Single    Spouse name: Not on file   Number of children: 1   Years of education: 12   Highest education level: Not on file  Occupational History    Occupation: Umemployed    Comment: Disability  Tobacco Use   Smoking status: Never   Smokeless tobacco: Never  Vaping Use   Vaping status: Never Used  Substance and Sexual Activity   Alcohol use: No   Drug use: No   Sexual activity: Not Currently    Birth control/protection: Surgical  Other Topics Concern   Not on file  Social History Narrative   Lives at home alone.   Disabled - speech difficulties, cannot drive   Right-handed.   Occasional caffeine use.   Her daughter lives about 30-45 minutes away   She has lots of family nearby - uncle, mother, sister, brother   Social Determinants of Health   Financial Resource Strain: Low Risk  (12/27/2022)   Overall Financial Resource Strain (CARDIA)    Difficulty of Paying Living Expenses: Not hard at all  Food Insecurity: No Food Insecurity (12/27/2022)   Hunger Vital Sign    Worried About Running Out of Food in the Last Year: Never true    Ran Out of Food in the Last Year: Never true  Transportation Needs: No Transportation Needs (12/27/2022)   PRAPARE - Administrator, Civil Service (Medical): No    Lack of Transportation (Non-Medical): No  Physical Activity: Inactive (12/27/2022)   Exercise Vital Sign    Days of Exercise per Week: 0 days    Minutes of Exercise per Session: 0 min  Stress: No Stress Concern Present (12/27/2022)   [employer] of Occupational Health - Occupational Stress Questionnaire    Feeling of Stress : Not at all  Social Connections: Moderately Isolated (12/27/2022)   Social Connection and Isolation Panel [NHANES]    Frequency of Communication with Friends and Family: More than three times a week    Frequency of Social Gatherings with Friends and Family: More than three times a week    Attends Religious Services: More than 4 times per year    Active Member of [employer] or Organizations: No    Attends Banker Meetings: Never    Marital Status: Never married  Intimate Partner Violence:  Not At Risk (12/27/2022)   Humiliation, Afraid, Rape, and Kick questionnaire    Fear of Current or Ex-Partner: No    Emotionally Abused: No    Physically Abused: No    Sexually Abused: No    FAMILY HISTORY:  Family History  Problem Relation Age of Onset   Hypertension Mother    Lung cancer Father    Hypertension Brother    Colon cancer Neg Hx    Gastric cancer Neg Hx  Esophageal cancer Neg Hx     CURRENT MEDICATIONS:  Outpatient Encounter Medications as of 02/13/2023  Medication Sig   amLODipine (NORVASC) 10 MG tablet TAKE ONE TABLET EVERY DAY   baclofen (LIORESAL) 10 MG tablet TAKE ONE TABLET THREE TIMES DAILY   buPROPion (WELLBUTRIN XL) 150 MG 24 hr tablet Take 1 tablet (150 mg total) by mouth daily. **NEEDS TO BE SEEN BEFORE NEXT REFILL**   busPIRone (BUSPAR) 5 MG tablet TAKE ONE TABLET BY MOUTH UP TO THREE TIMES DAILY.   celecoxib (CELEBREX) 200 MG capsule TAKE ONE CAPSULE TWICE DAILY   diazepam (VALIUM) 5 MG tablet Take one tablet one hour prior to procedure. May repeat in 30 minutes if needed. MUST HAVE DRIVER.   escitalopram (LEXAPRO) 20 MG tablet TAKE ONE TABLET EVERY DAY   lisinopril (ZESTRIL) 20 MG tablet TAKE ONE TABLET EVERY DAY   pantoprazole (PROTONIX) 40 MG tablet TAKE ONE TABLET EVERY DAY BEFORE BREAKFAST   predniSONE (DELTASONE) 10 MG tablet Take by mouth.   predniSONE (STERAPRED UNI-PAK 21 TAB) 10 MG (21) TBPK tablet Use as directed   No facility-administered encounter medications on file as of 02/13/2023.    ALLERGIES:  Allergies  Allergen Reactions   Acetaminophen Other (See Comments)    MD requests not to be given   Venofer [Iron Sucrose] Rash    LABORATORY DATA:  I have reviewed the labs as listed.  CBC    Component Value Date/Time   WBC 7.1 02/06/2023 1453   RBC 3.60 (L) 02/06/2023 1453   HGB 10.3 (L) 02/06/2023 1453   HGB 12.6 10/24/2020 1318   HCT 32.6 (L) 02/06/2023 1453   HCT 39.0 10/24/2020 1318   PLT 238 02/06/2023 1453   PLT 277  10/24/2020 1318   MCV 90.6 02/06/2023 1453   MCV 88 10/24/2020 1318   MCH 28.6 02/06/2023 1453   MCHC 31.6 02/06/2023 1453   RDW 14.9 02/06/2023 1453   RDW 14.8 10/24/2020 1318   LYMPHSABS 1.8 02/06/2023 1453   LYMPHSABS 2.3 10/24/2020 1318   MONOABS 0.7 02/06/2023 1453   EOSABS 0.2 02/06/2023 1453   EOSABS 0.2 10/24/2020 1318   BASOSABS 0.0 02/06/2023 1453   BASOSABS 0.0 10/24/2020 1318      Latest Ref Rng & Units 11/30/2022   10:15 AM 09/06/2022   10:55 AM 03/02/2022   11:14 AM  CMP  Glucose 70 - 99 mg/dL 253  84  95   BUN 6 - 20 mg/dL 15  21  11    Creatinine 0.44 - 1.00 mg/dL 6.64  4.03  4.74   Sodium 135 - 145 mmol/L 138  140  140   Potassium 3.5 - 5.1 mmol/L 3.6  4.0  3.7   Chloride 98 - 111 mmol/L 105  102  104   CO2 22 - 32 mmol/L 25  22  27    Calcium 8.9 - 10.3 mg/dL 9.3  9.8  9.8   Total Protein 6.5 - 8.1 g/dL 7.8  6.9  7.5   Total Bilirubin 0.3 - 1.2 mg/dL 0.7  0.2  0.4   Alkaline Phos 38 - 126 U/L 77  102  69   AST 15 - 41 U/L 26  10  14    ALT 0 - 44 U/L 30  15  17      DIAGNOSTIC IMAGING:  I have independently reviewed the relevant imaging and discussed with the patient.   WRAP UP:  All questions were answered. The patient knows to call the clinic  with any problems, questions or concerns.  Medical decision making: Low  Time spent on visit: I spent 15 minutes counseling the patient face to face. The total time spent in the appointment was 22 minutes and more than 50% was on counseling.  Carnella Guadalajara, PA-C  ***

## 2023-02-12 NOTE — Telephone Encounter (Signed)
Patient's daugther Brittany Durham called needing to schedule an appointment for her mother. Brittany Durham said she was referred to Dr. Alvester Morin. The number to contact Brittany Durham is [phone]

## 2023-02-13 ENCOUNTER — Telehealth: Payer: Self-pay | Admitting: Physical Medicine and Rehabilitation

## 2023-02-13 ENCOUNTER — Inpatient Hospital Stay: Payer: 59 | Attending: Hematology | Admitting: Physician Assistant

## 2023-02-13 VITALS — BP 108/78 | HR 90 | Temp 98.2°F | Resp 18 | Ht 67.0 in | Wt 204.1 lb

## 2023-02-13 DIAGNOSIS — D500 Iron deficiency anemia secondary to blood loss (chronic): Secondary | ICD-10-CM | POA: Diagnosis not present

## 2023-02-13 DIAGNOSIS — Z79899 Other long term (current) drug therapy: Secondary | ICD-10-CM | POA: Insufficient documentation

## 2023-02-13 DIAGNOSIS — D509 Iron deficiency anemia, unspecified: Secondary | ICD-10-CM | POA: Insufficient documentation

## 2023-02-13 NOTE — Patient Instructions (Signed)
[facility] at [facility] Discharge Instructions  You were seen today by Rojelio Brenner PA-C for your iron deficiency anemia.  Your blood levels and iron levels are still low.    I recommend that we schedule you for an additional IV iron x 3 doses.  We will recheck your blood and iron levels in 2 months along with an office visit.  Continue to follow-up with your GI doctor regarding possible bleeding in your stomach and intestines.  FOLLOW-UP APPOINTMENT: Same-day labs and office visit in 2 months   - - - - - - - - - - - - - - - - - -    Thank you for choosing [facility] at [facility] to provide your oncology and hematology care.  To afford each patient quality time with our provider, please arrive at least 15 minutes before your scheduled appointment time.   If you have a lab appointment with the Cancer Center please come in thru the Main Entrance and check in at the main information desk.  You need to re-schedule your appointment should you arrive 10 or more minutes late.  We strive to give you quality time with our providers, and arriving late affects you and other patients whose appointments are after yours.  Also, if you no show three or more times for appointments you may be dismissed from the clinic at the providers discretion.     Again, thank you for choosing [facility].  Our hope is that these requests will decrease the amount of time that you wait before being seen by our physicians.       _____________________________________________________________  Should you have questions after your visit to [facility], please contact our office at [phone] and follow the prompts.  Our office hours are 8:00 a.m. and 4:30 p.m. Monday - Friday.  Please note that voicemails left after 4:00 p.m. may not be returned until the following business day.  We are closed weekends and major holidays.  You do have access  to a nurse 24-7, just call the main number to the clinic [phone] and do not press any options, hold on the line and a nurse will answer the phone.    For prescription refill requests, have your pharmacy contact our office and allow 72 hours.    Due to Covid, you will need to wear a mask upon entering the hospital. If you do not have a mask, a mask will be given to you at the Main Entrance upon arrival. For doctor visits, patients may have 1 support person age 76 or older with them. For treatment visits, patients can not have anyone with them due to social distancing guidelines and our immunocompromised population.

## 2023-02-13 NOTE — Telephone Encounter (Signed)
Patient daughter called returning the call. ZO#[phone]

## 2023-02-13 NOTE — Telephone Encounter (Signed)
Spoke with patient's daughter and scheduled injection for 02/21/23. Patient aware driver needed

## 2023-02-13 NOTE — Telephone Encounter (Signed)
LVM to return call to schedule injection 

## 2023-02-19 ENCOUNTER — Encounter: Payer: Self-pay | Admitting: Family

## 2023-02-19 ENCOUNTER — Ambulatory Visit: Payer: 59 | Admitting: Family

## 2023-02-19 VITALS — BP 96/63 | HR 109 | Temp 98.0°F | Ht 67.0 in | Wt 203.0 lb

## 2023-02-19 DIAGNOSIS — M546 Pain in thoracic spine: Secondary | ICD-10-CM

## 2023-02-19 DIAGNOSIS — G8929 Other chronic pain: Secondary | ICD-10-CM

## 2023-02-19 DIAGNOSIS — G4700 Insomnia, unspecified: Secondary | ICD-10-CM | POA: Diagnosis not present

## 2023-02-19 DIAGNOSIS — I10 Essential (primary) hypertension: Secondary | ICD-10-CM | POA: Diagnosis not present

## 2023-02-19 DIAGNOSIS — Z0000 Encounter for general adult medical examination without abnormal findings: Secondary | ICD-10-CM

## 2023-02-19 DIAGNOSIS — M150 Primary generalized (osteo)arthritis: Secondary | ICD-10-CM | POA: Diagnosis not present

## 2023-02-19 DIAGNOSIS — Z0001 Encounter for general adult medical examination with abnormal findings: Secondary | ICD-10-CM

## 2023-02-19 DIAGNOSIS — I959 Hypotension, unspecified: Secondary | ICD-10-CM

## 2023-02-19 DIAGNOSIS — F321 Major depressive disorder, single episode, moderate: Secondary | ICD-10-CM | POA: Diagnosis not present

## 2023-02-19 DIAGNOSIS — D500 Iron deficiency anemia secondary to blood loss (chronic): Secondary | ICD-10-CM

## 2023-02-19 DIAGNOSIS — K219 Gastro-esophageal reflux disease without esophagitis: Secondary | ICD-10-CM

## 2023-02-19 DIAGNOSIS — K279 Peptic ulcer, site unspecified, unspecified as acute or chronic, without hemorrhage or perforation: Secondary | ICD-10-CM | POA: Diagnosis not present

## 2023-02-19 DIAGNOSIS — F411 Generalized anxiety disorder: Secondary | ICD-10-CM

## 2023-02-19 MED ORDER — CELECOXIB 200 MG PO CAPS
200.0000 mg | ORAL_CAPSULE | Freq: Two times a day (BID) | ORAL | 1 refills | Status: DC
Start: 2023-02-19 — End: 2023-08-20

## 2023-02-19 MED ORDER — BUSPIRONE HCL 5 MG PO TABS
ORAL_TABLET | ORAL | 1 refills | Status: DC
Start: 2023-02-19 — End: 2023-07-09

## 2023-02-19 MED ORDER — AMLODIPINE BESYLATE 10 MG PO TABS
10.0000 mg | ORAL_TABLET | Freq: Every day | ORAL | 0 refills | Status: DC
Start: 2023-02-19 — End: 2023-07-09

## 2023-02-19 MED ORDER — ESCITALOPRAM OXALATE 20 MG PO TABS: 20.0000 mg | ORAL_TABLET | Freq: Every day | ORAL | 0 refills | Status: DC

## 2023-02-19 MED ORDER — PANTOPRAZOLE SODIUM 40 MG PO TBEC: DELAYED_RELEASE_TABLET | ORAL | 0 refills | Status: DC

## 2023-02-19 MED ORDER — BACLOFEN 10 MG PO TABS: 10.0000 mg | ORAL_TABLET | Freq: Three times a day (TID) | ORAL | 1 refills | Status: DC

## 2023-02-19 MED ORDER — BUPROPION HCL ER (XL) 150 MG PO TB24: 150.0000 mg | ORAL_TABLET | Freq: Every day | ORAL | 0 refills | Status: DC

## 2023-02-19 NOTE — Patient Instructions (Signed)

## 2023-02-19 NOTE — Progress Notes (Signed)
Subjective:    Patient ID: Brittany Durham, female    DOB: August 06, 1970, 52 y.o.   MRN: [id number]  Chief Complaint  Patient presents with   Medical Management of Chronic Issues   PT presents to the office today for chronic follow up. Pt has seen Neurologists for Speech difficulties and seizures, but has not seen them in awhile.     She is followed by Hematologists for iron deficiency and getting iron infusions.   She is followed by Ortho for back pain and doing PT. Will be getting injections.  She if followed by Cardiologists for syncope and palpitations. These are were stable and told to follow up in a year.    Hypertension This is a chronic problem. The current episode started more than 1 year ago. The problem has been resolved since onset. The problem is controlled. Associated symptoms include anxiety. Risk factors for coronary artery disease include dyslipidemia, obesity and sedentary lifestyle. Past treatments include calcium channel blockers and ACE inhibitors. The current treatment provides significant improvement.  Gastroesophageal Reflux She complains of belching and heartburn. She reports no hoarse voice or no stridor. This is a chronic problem. The current episode started more than 1 year ago. The problem occurs occasionally. Associated symptoms include fatigue. She has tried a PPI for the symptoms. The treatment provided moderate relief.  Back Pain This is a chronic problem. The current episode started more than 1 year ago. The problem occurs intermittently. The problem is unchanged. The pain is present in the thoracic spine. The quality of the pain is described as aching. The pain is at a severity of 10/10. The pain is moderate. Risk factors include obesity. The treatment provided moderate relief.  Arthritis Presents for follow-up visit. She complains of pain and stiffness. Affected locations include the left knee, right knee, left MCP and right MCP. Her pain is at a severity of  9/10. Associated symptoms include fatigue.  Insomnia Primary symptoms: difficulty falling asleep, frequent awakening.   The current episode started more than one year. The onset quality is gradual. The problem occurs intermittently. Past treatments include medication. The treatment provided moderate relief. PMH includes: depression.   Anxiety Presents for follow-up visit. Symptoms include depressed mood, excessive worry, insomnia, nervous/anxious behavior and restlessness. Symptoms occur occasionally. The severity of symptoms is moderate.    Depression        This is a chronic problem.  The current episode started more than 1 year ago.   The problem occurs intermittently.  Associated symptoms include fatigue, helplessness, hopelessness, insomnia, restlessness and sad.  Past treatments include SSRIs - Selective serotonin reuptake inhibitors.  Past medical history includes anxiety.       Review of Systems  Constitutional:  Positive for fatigue.  HENT:  Negative for hoarse voice.   Gastrointestinal:  Positive for heartburn.  Musculoskeletal:  Positive for arthritis, back pain and stiffness.  Psychiatric/Behavioral:  Positive for depression. The patient is nervous/anxious and has insomnia.   All other systems reviewed and are negative.  Family History  Problem Relation Age of Onset   Hypertension Mother    Lung cancer Father    Hypertension Brother    Colon cancer Neg Hx    Gastric cancer Neg Hx    Esophageal cancer Neg Hx    Social History   Socioeconomic History   Marital status: Single    Spouse name: Not on file   Number of children: 1   Years of education: 12   Highest  education level: Not on file  Occupational History   Occupation: Umemployed    Comment: Disability  Tobacco Use   Smoking status: Never   Smokeless tobacco: Never  Vaping Use   Vaping status: Never Used  Substance and Sexual Activity   Alcohol use: No   Drug use: No   Sexual activity: Not Currently     Birth control/protection: Surgical  Other Topics Concern   Not on file  Social History Narrative   Lives at home alone.   Disabled - speech difficulties, cannot drive   Right-handed.   Occasional caffeine use.   Her daughter lives about 30-45 minutes away   She has lots of family nearby - uncle, mother, sister, brother   Social Determinants of Health   Financial Resource Strain: Low Risk  (12/27/2022)   Overall Financial Resource Strain (CARDIA)    Difficulty of Paying Living Expenses: Not hard at all  Food Insecurity: No Food Insecurity (12/27/2022)   Hunger Vital Sign    Worried About Running Out of Food in the Last Year: Never true    Ran Out of Food in the Last Year: Never true  Transportation Needs: No Transportation Needs (12/27/2022)   PRAPARE - Administrator, Civil Service (Medical): No    Lack of Transportation (Non-Medical): No  Physical Activity: Inactive (12/27/2022)   Exercise Vital Sign    Days of Exercise per Week: 0 days    Minutes of Exercise per Session: 0 min  Stress: No Stress Concern Present (12/27/2022)   [employer] of Occupational Health - Occupational Stress Questionnaire    Feeling of Stress : Not at all  Social Connections: Moderately Isolated (12/27/2022)   Social Connection and Isolation Panel [NHANES]    Frequency of Communication with Friends and Family: More than three times a week    Frequency of Social Gatherings with Friends and Family: More than three times a week    Attends Religious Services: More than 4 times per year    Active Member of [employer] or Organizations: No    Attends Banker Meetings: Never    Marital Status: Never married       Objective:   Physical Exam Vitals reviewed.  Constitutional:      General: She is not in acute distress.    Appearance: She is well-developed.  HENT:     Head: Normocephalic and atraumatic.     Right Ear: Tympanic membrane normal.     Left Ear: Tympanic membrane normal.   Eyes:     Pupils: Pupils are equal, round, and reactive to light.  Neck:     Thyroid: No thyromegaly.  Cardiovascular:     Rate and Rhythm: Normal rate and regular rhythm.     Heart sounds: Normal heart sounds. No murmur heard. Pulmonary:     Effort: Pulmonary effort is normal. No respiratory distress.     Breath sounds: Normal breath sounds. No wheezing.  Abdominal:     General: Bowel sounds are normal. There is no distension.     Palpations: Abdomen is soft.     Tenderness: There is no abdominal tenderness.  Musculoskeletal:        General: No tenderness. Normal range of motion.     Cervical back: Normal range of motion and neck supple.  Skin:    General: Skin is warm and dry.  Neurological:     Mental Status: She is alert and oriented to person, place, and time.  Cranial Nerves: No cranial nerve deficit.     Deep Tendon Reflexes: Reflexes are normal and symmetric.  Psychiatric:        Behavior: Behavior normal.        Thought Content: Thought content normal.        Judgment: Judgment normal.       BP 96/63   Pulse (!) 109   Temp 98 F (36.7 C) (Temporal)   Ht 5\' 7"  (1.702 m)   Wt 203 lb (92.1 kg)   LMP 09/16/2017   SpO2 96%   BMI 31.79 kg/m      Assessment & Plan:  Brittany Durham comes in today with chief complaint of Medical Management of Chronic Issues   Diagnosis and orders addressed:  1. HYPERTENSION, BENIGN Will stop Lisinopril 20 mg today - amLODipine (NORVASC) 10 MG tablet; Take 1 tablet (10 mg total) by mouth daily.  Dispense: 90 tablet; Refill: 0 - CMP14+EGFR - CBC with Differential/Platelet  2. Chronic left-sided thoracic back pain - baclofen (LIORESAL) 10 MG tablet; Take 1 tablet (10 mg total) by mouth 3 (three) times daily.  Dispense: 90 tablet; Refill: 1 - celecoxib (CELEBREX) 200 MG capsule; Take 1 capsule (200 mg total) by mouth 2 (two) times daily.  Dispense: 180 capsule; Refill: 1 - CMP14+EGFR - CBC with Differential/Platelet  3.  Depression, major, single episode, moderate (HCC) - buPROPion (WELLBUTRIN XL) 150 MG 24 hr tablet; Take 1 tablet (150 mg total) by mouth daily. **NEEDS TO BE SEEN BEFORE NEXT REFILL**  Dispense: 30 tablet; Refill: 0 - escitalopram (LEXAPRO) 20 MG tablet; Take 1 tablet (20 mg total) by mouth daily.  Dispense: 180 tablet; Refill: 0 - CMP14+EGFR - CBC with Differential/Platelet  4. GAD (generalized anxiety disorder) - buPROPion (WELLBUTRIN XL) 150 MG 24 hr tablet; Take 1 tablet (150 mg total) by mouth daily. **NEEDS TO BE SEEN BEFORE NEXT REFILL**  Dispense: 30 tablet; Refill: 0 - busPIRone (BUSPAR) 5 MG tablet; TAKE ONE TABLET BY MOUTH UP TO THREE TIMES DAILY.  Dispense: 90 tablet; Refill: 1 - escitalopram (LEXAPRO) 20 MG tablet; Take 1 tablet (20 mg total) by mouth daily.  Dispense: 180 tablet; Refill: 0 - CMP14+EGFR - CBC with Differential/Platelet  5. PUD (peptic ulcer disease) - pantoprazole (PROTONIX) 40 MG tablet; TAKE ONE TABLET EVERY DAY BEFORE BREAKFAST  Dispense: 90 tablet; Refill: 0 - CMP14+EGFR - CBC with Differential/Platelet  6. Gastroesophageal reflux disease without esophagitis - CMP14+EGFR - CBC with Differential/Platelet  7. Insomnia, unspecified type - CMP14+EGFR - CBC with Differential/Platelet  8. Iron deficiency anemia due to chronic blood loss - CMP14+EGFR - CBC with Differential/Platelet  9. Primary osteoarthritis involving multiple joints - celecoxib (CELEBREX) 200 MG capsule; Take 1 capsule (200 mg total) by mouth 2 (two) times daily.  Dispense: 180 capsule; Refill: 1 - CMP14+EGFR - CBC with Differential/Platelet  10. Annual physical exam - CMP14+EGFR - CBC with Differential/Platelet - Lipid panel  11. Hypotension, unspecified hypotension type Stop lisinopril 20 mg    Labs pending Health Maintenance reviewed Diet and exercise encouraged  Follow up plan: 2 weeks to recheck hypotension   Jannifer Rodney, FNP

## 2023-02-20 LAB — LIPID PANEL
Cholesterol, Total: 184 mg/dL (ref 100–199)
HDL: 52 mg/dL (ref >39)
LDL CALC COMMENT:: 3.5 ratio (ref 0.0–4.4)
LDL Chol Calc (NIH): 115 mg/dL — ABNORMAL HIGH (ref 0–99)
Triglycerides: 95 mg/dL (ref 0–149)
VLDL Cholesterol Cal: 17 mg/dL (ref 5–40)

## 2023-02-20 LAB — CBC WITH DIFFERENTIAL/PLATELET
Basophils Absolute: 0 10*3/uL (ref 0.0–0.2)
Basos: 0 %
EOS (ABSOLUTE): 0.1 10*3/uL (ref 0.0–0.4)
Eos: 2 %
Hematocrit: 30.3 % — ABNORMAL LOW (ref 34.0–46.6)
Hemoglobin: 9.6 g/dL — ABNORMAL LOW (ref 11.1–15.9)
Immature Grans (Abs): 0 10*3/uL (ref 0.0–0.1)
Immature Granulocytes: 0 %
Lymphocytes Absolute: 1.7 10*3/uL (ref 0.7–3.1)
Lymphs: 31 %
MCH: 27.9 pg (ref 26.6–33.0)
MCHC: 31.7 g/dL (ref 31.5–35.7)
MCV: 88 fL (ref 79–97)
Monocytes Absolute: 0.5 10*3/uL (ref 0.1–0.9)
Monocytes: 9 %
Neutrophils Absolute: 3.1 10*3/uL (ref 1.4–7.0)
Neutrophils: 58 %
Platelets: 282 10*3/uL (ref 150–450)
RBC: 3.44 x10E6/uL — ABNORMAL LOW (ref 3.77–5.28)
RDW: 14.7 % (ref 11.7–15.4)
WBC: 5.5 10*3/uL (ref 3.4–10.8)

## 2023-02-20 LAB — CMP14+EGFR
ALT: 24 IU/L (ref 0–32)
AST: 26 IU/L (ref 0–40)
Albumin: 4.5 g/dL (ref 3.8–4.9)
Alkaline Phosphatase: 84 IU/L (ref 44–121)
BUN/Creatinine Ratio: 13 (ref 9–23)
BUN: 12 mg/dL (ref 6–24)
Bilirubin Total: 0.2 mg/dL (ref 0.0–1.2)
CO2: 23 mmol/L (ref 20–29)
Calcium: 9.9 mg/dL (ref 8.7–10.2)
Chloride: 105 mmol/L (ref 96–106)
Creatinine, Ser: 0.93 mg/dL (ref 0.57–1.00)
Globulin, Total: 2.6 g/dL (ref 1.5–4.5)
Glucose: 85 mg/dL (ref 70–99)
Potassium: 4.2 mmol/L (ref 3.5–5.2)
Sodium: 143 mmol/L (ref 134–144)
Total Protein: 7.1 g/dL (ref 6.0–8.5)
eGFR: 73 mL/min/{1.73_m2} (ref >59)

## 2023-02-21 ENCOUNTER — Inpatient Hospital Stay: Payer: 59

## 2023-02-21 ENCOUNTER — Other Ambulatory Visit: Payer: Self-pay

## 2023-02-21 ENCOUNTER — Other Ambulatory Visit: Payer: Self-pay | Admitting: Family

## 2023-02-21 ENCOUNTER — Ambulatory Visit: Payer: 59 | Admitting: Physical Medicine and Rehabilitation

## 2023-02-21 VITALS — BP 122/78 | HR 108

## 2023-02-21 DIAGNOSIS — M5416 Radiculopathy, lumbar region: Secondary | ICD-10-CM

## 2023-02-21 MED ORDER — METHYLPREDNISOLONE ACETATE 40 MG/ML IJ SUSP
40.0000 mg | Freq: Once | INTRAMUSCULAR | Status: AC
Start: 2023-02-21 — End: 2023-02-21
  Administered 2023-02-21: 40 mg

## 2023-02-21 NOTE — Patient Instructions (Signed)

## 2023-02-21 NOTE — Progress Notes (Signed)
Functional Pain Scale - descriptive words and definitions  Distressing (6)    Pain is present/unable to complete most ADLs limited by pain/sleep is difficult and active distraction is only marginal. Moderate range order  Average Pain 8   +Driver, -BT, -Dye Allergies.  Lower back pain on the left side with no radiation in the leg

## 2023-02-26 ENCOUNTER — Encounter: Payer: Self-pay | Admitting: Family

## 2023-03-01 ENCOUNTER — Inpatient Hospital Stay: Payer: 59

## 2023-03-01 VITALS — BP 138/81 | HR 92 | Temp 97.2°F | Resp 18

## 2023-03-01 DIAGNOSIS — D500 Iron deficiency anemia secondary to blood loss (chronic): Secondary | ICD-10-CM

## 2023-03-01 DIAGNOSIS — D509 Iron deficiency anemia, unspecified: Secondary | ICD-10-CM | POA: Diagnosis not present

## 2023-03-01 DIAGNOSIS — D62 Acute posthemorrhagic anemia: Secondary | ICD-10-CM

## 2023-03-01 DIAGNOSIS — Z79899 Other long term (current) drug therapy: Secondary | ICD-10-CM | POA: Diagnosis not present

## 2023-03-01 MED ORDER — SODIUM CHLORIDE 0.9 % IV SOLN
510.0000 mg | Freq: Once | INTRAVENOUS | Status: AC
  Administered 2023-03-01: 510 mg via INTRAVENOUS
  Filled 2023-03-01: qty 510

## 2023-03-01 MED ORDER — CETIRIZINE HCL 10 MG/ML IV SOLN
10.0000 mg | Freq: Once | INTRAVENOUS | Status: AC
  Administered 2023-03-01: 10 mg via INTRAVENOUS
  Filled 2023-03-01: qty 1

## 2023-03-01 MED ORDER — METHYLPREDNISOLONE SODIUM SUCC 125 MG IJ SOLR
125.0000 mg | Freq: Once | INTRAMUSCULAR | Status: AC
  Administered 2023-03-01: 125 mg via INTRAVENOUS
  Filled 2023-03-01: qty 2

## 2023-03-01 MED ORDER — SODIUM CHLORIDE 0.9 % IV SOLN: Freq: Once | INTRAVENOUS | Status: AC

## 2023-03-01 MED ORDER — FAMOTIDINE IN NACL 20-0.9 MG/50ML-% IV SOLN
20.0000 mg | Freq: Once | INTRAVENOUS | Status: AC
  Administered 2023-03-01: 20 mg via INTRAVENOUS
  Filled 2023-03-01: qty 50

## 2023-03-01 NOTE — Patient Instructions (Signed)
 Ferumoxytol Injection What is this medication? FERUMOXYTOL (FER ue MOX i tol) treats low levels of iron in your body (iron deficiency anemia). Iron is a mineral that plays an important role in making red blood cells, which carry oxygen from your lungs to the rest of your body. This medicine may be used for other purposes; ask your health care provider or pharmacist if you have questions. COMMON BRAND NAME(S): Feraheme What should I tell my care team before I take this medication? They need to know if you have any of these conditions: Anemia not caused by low iron levels High levels of iron in the blood Magnetic resonance imaging (MRI) test scheduled An unusual or allergic reaction to iron, other medications, foods, dyes, or preservatives Pregnant or trying to get pregnant Breastfeeding How should I use this medication? This medication is injected into a vein. It is given by your care team in a hospital or clinic setting. Talk to your care team the use of this medication in children. Special care may be needed. Overdosage: If you think you have taken too much of this medicine contact a poison control center or emergency room at once. NOTE: This medicine is only for you. Do not share this medicine with others. What if I miss a dose? It is important not to miss your dose. Call your care team if you are unable to keep an appointment. What may interact with this medication? Other iron products This list may not describe all possible interactions. Give your health care provider a list of all the medicines, herbs, non-prescription drugs, or dietary supplements you use. Also tell them if you smoke, drink alcohol, or use illegal drugs. Some items may interact with your medicine. What should I watch for while using this medication? Visit your care team regularly. Tell your care team if your symptoms do not start to get better or if they get worse. You may need blood work done while you are taking this  medication. You may need to follow a special diet. Talk to your care team. Foods that contain iron include: whole grains/cereals, dried fruits, beans, or peas, leafy green vegetables, and organ meats (liver, kidney). What side effects may I notice from receiving this medication? Side effects that you should report to your care team as soon as possible: Allergic reactions--skin rash, itching, hives, swelling of the face, lips, tongue, or throat Low blood pressure--dizziness, feeling faint or lightheaded, blurry vision Shortness of breath Side effects that usually do not require medical attention (report to your care team if they continue or are bothersome): Flushing Headache Joint pain Muscle pain Nausea Pain, redness, or irritation at injection site This list may not describe all possible side effects. Call your doctor for medical advice about side effects. You may report side effects to FDA at 1-800-FDA-1088. Where should I keep my medication? This medication is given in a hospital or clinic. It will not be stored at home. NOTE: This sheet is a summary. It may not cover all possible information. If you have questions about this medicine, talk to your doctor, pharmacist, or health care provider.  2024 Elsevier/Gold Standard (2022-10-05 00:00:00)

## 2023-03-01 NOTE — Progress Notes (Signed)
Patient tolerated iron infusion with no complaints voiced.  Peripheral IV site clean and dry with good blood return noted before and after infusion. Pt observed for 30 minutes post Feraheme infusion.  VSS with discharge and left in satisfactory condition with no s/s of distress noted. All follow ups as scheduled.   Kimmie Berggren [employer]

## 2023-03-04 ENCOUNTER — Other Ambulatory Visit: Payer: Self-pay | Admitting: Family

## 2023-03-04 DIAGNOSIS — I10 Essential (primary) hypertension: Secondary | ICD-10-CM

## 2023-03-05 ENCOUNTER — Encounter: Payer: Self-pay | Admitting: Family

## 2023-03-05 ENCOUNTER — Ambulatory Visit: Payer: 59 | Admitting: Family

## 2023-03-05 VITALS — BP 126/80 | HR 101 | Temp 97.2°F | Ht 67.0 in | Wt 201.8 lb

## 2023-03-05 DIAGNOSIS — I959 Hypotension, unspecified: Secondary | ICD-10-CM

## 2023-03-05 DIAGNOSIS — F411 Generalized anxiety disorder: Secondary | ICD-10-CM

## 2023-03-05 DIAGNOSIS — Z23 Encounter for immunization: Secondary | ICD-10-CM

## 2023-03-05 DIAGNOSIS — I10 Essential (primary) hypertension: Secondary | ICD-10-CM

## 2023-03-05 NOTE — Addendum Note (Signed)
Addended by: Ignacia Bayley on: 03/05/2023 03:35 PM   Modules accepted: Orders

## 2023-03-05 NOTE — Patient Instructions (Signed)
Hypertension, Adult High blood pressure (hypertension) is when the force of blood pumping through the arteries is too strong. The arteries are the blood vessels that carry blood from the heart throughout the body. Hypertension forces the heart to work harder to pump blood and may cause arteries to become narrow or stiff. Untreated or uncontrolled hypertension can lead to a heart attack, heart failure, a stroke, kidney disease, and other problems. A blood pressure reading consists of a higher number over a lower number. Ideally, your blood pressure should be below 120/80. The first ("top") number is called the systolic pressure. It is a measure of the pressure in your arteries as your heart beats. The second ("bottom") number is called the diastolic pressure. It is a measure of the pressure in your arteries as the heart relaxes. What are the causes? The exact cause of this condition is not known. There are some conditions that result in high blood pressure. What increases the risk? Certain factors may make you more likely to develop high blood pressure. Some of these risk factors are under your control, including: Smoking. Not getting enough exercise or physical activity. Being overweight. Having too much fat, sugar, calories, or salt (sodium) in your diet. Drinking too much alcohol. Other risk factors include: Having a personal history of heart disease, diabetes, high cholesterol, or kidney disease. Stress. Having a family history of high blood pressure and high cholesterol. Having obstructive sleep apnea. Age. The risk increases with age. What are the signs or symptoms? High blood pressure may not cause symptoms. Very high blood pressure (hypertensive crisis) may cause: Headache. Fast or irregular heartbeats (palpitations). Shortness of breath. Nosebleed. Nausea and vomiting. Vision changes. Severe chest pain, dizziness, and seizures. How is this diagnosed? This condition is diagnosed by  measuring your blood pressure while you are seated, with your arm resting on a flat surface, your legs uncrossed, and your feet flat on the floor. The cuff of the blood pressure monitor will be placed directly against the skin of your upper arm at the level of your heart. Blood pressure should be measured at least twice using the same arm. Certain conditions can cause a difference in blood pressure between your right and left arms. If you have a high blood pressure reading during one visit or you have normal blood pressure with other risk factors, you may be asked to: Return on a different day to have your blood pressure checked again. Monitor your blood pressure at home for 1 week or longer. If you are diagnosed with hypertension, you may have other blood or imaging tests to help your health care provider understand your overall risk for other conditions. How is this treated? This condition is treated by making healthy lifestyle changes, such as eating healthy foods, exercising more, and reducing your alcohol intake. You may be referred for counseling on a healthy diet and physical activity. Your health care provider may prescribe medicine if lifestyle changes are not enough to get your blood pressure under control and if: Your systolic blood pressure is above 130. Your diastolic blood pressure is above 80. Your personal target blood pressure may vary depending on your medical conditions, your age, and other factors. Follow these instructions at home: Eating and drinking  Eat a diet that is high in fiber and potassium, and low in sodium, added sugar, and fat. An example of this eating plan is called the DASH diet. DASH stands for Dietary Approaches to Stop Hypertension. To eat this way: Eat   plenty of fresh fruits and vegetables. Try to fill one half of your plate at each meal with fruits and vegetables. Eat whole grains, such as whole-wheat pasta, brown rice, or whole-grain bread. Fill about one  fourth of your plate with whole grains. Eat or drink low-fat dairy products, such as skim milk or low-fat yogurt. Avoid fatty cuts of meat, processed or cured meats, and poultry with skin. Fill about one fourth of your plate with lean proteins, such as fish, chicken without skin, beans, eggs, or tofu. Avoid pre-made and processed foods. These tend to be higher in sodium, added sugar, and fat. Reduce your daily sodium intake. Many people with hypertension should eat less than 1,500 mg of sodium a day. Do not drink alcohol if: Your health care provider tells you not to drink. You are pregnant, may be pregnant, or are planning to become pregnant. If you drink alcohol: Limit how much you have to: 0-1 drink a day for women. 0-2 drinks a day for men. Know how much alcohol is in your drink. In the [place] one drink equals one 12 oz bottle of beer (355 mL), one 5 oz glass of wine (148 mL), or one 1 oz glass of hard liquor (44 mL). Lifestyle  Work with your health care provider to maintain a healthy body weight or to lose weight. Ask what an ideal weight is for you. Get at least 30 minutes of exercise that causes your heart to beat faster (aerobic exercise) most days of the week. Activities may include walking, swimming, or biking. Include exercise to strengthen your muscles (resistance exercise), such as Pilates or lifting weights, as part of your weekly exercise routine. Try to do these types of exercises for 30 minutes at least 3 days a week. Do not use any products that contain nicotine or tobacco. These products include cigarettes, chewing tobacco, and vaping devices, such as e-cigarettes. If you need help quitting, ask your health care provider. Monitor your blood pressure at home as told by your health care provider. Keep all follow-up visits. This is important. Medicines Take over-the-counter and prescription medicines only as told by your health care provider. Follow directions carefully. Blood  pressure medicines must be taken as prescribed. Do not skip doses of blood pressure medicine. Doing this puts you at risk for problems and can make the medicine less effective. Ask your health care provider about side effects or reactions to medicines that you should watch for. Contact a health care provider if you: Think you are having a reaction to a medicine you are taking. Have headaches that keep coming back (recurring). Feel dizzy. Have swelling in your ankles. Have trouble with your vision. Get help right away if you: Develop a severe headache or confusion. Have unusual weakness or numbness. Feel faint. Have severe pain in your chest or abdomen. Vomit repeatedly. Have trouble breathing. These symptoms may be an emergency. Get help right away. Call 911. Do not wait to see if the symptoms will go away. Do not drive yourself to the hospital. Summary Hypertension is when the force of blood pumping through your arteries is too strong. If this condition is not controlled, it may put you at risk for serious complications. Your personal target blood pressure may vary depending on your medical conditions, your age, and other factors. For most people, a normal blood pressure is less than 120/80. Hypertension is treated with lifestyle changes, medicines, or a combination of both. Lifestyle changes include losing weight, eating a healthy,   low-sodium diet, exercising more, and limiting alcohol. This information is not intended to replace advice given to you by your health care provider. Make sure you discuss any questions you have with your health care provider. Document Revised: 03/07/2021 Document Reviewed: 03/07/2021 Elsevier Patient Education  2024 Elsevier Inc.  

## 2023-03-05 NOTE — Progress Notes (Signed)
Subjective:    Patient ID: Brittany Durham, female    DOB: [birth year], 52 y.o.   MRN: [id number]  Chief Complaint  Patient presents with   Hypotension   Pt presents to the office today to follow up on hypotension. She was seen on 02/19/23 and found to have a BP of 96/63. We stopped her lisinopril 20 mg. Her BP is at goal today! Hypertension This is a chronic problem. The current episode started more than 1 year ago. The problem has been resolved since onset. The problem is controlled. Associated symptoms include anxiety. Pertinent negatives include no malaise/fatigue, peripheral edema or shortness of breath. Past treatments include calcium channel blockers. The current treatment provides moderate improvement.  Anxiety Presents for follow-up visit. Symptoms include excessive worry, nervous/anxious behavior and restlessness. Patient reports no shortness of breath. Symptoms occur occasionally. The severity of symptoms is mild.        Review of Systems  Constitutional:  Negative for malaise/fatigue.  Respiratory:  Negative for shortness of breath.   Psychiatric/Behavioral:  The patient is nervous/anxious.   All other systems reviewed and are negative.      Objective:   Physical Exam Vitals reviewed.  Constitutional:      General: She is not in acute distress.    Appearance: She is well-developed.  HENT:     Head: Normocephalic and atraumatic.     Right Ear: Tympanic membrane normal.     Left Ear: Tympanic membrane normal.  Eyes:     Pupils: Pupils are equal, round, and reactive to light.  Neck:     Thyroid: No thyromegaly.  Cardiovascular:     Rate and Rhythm: Normal rate and regular rhythm.     Heart sounds: Normal heart sounds. No murmur heard. Pulmonary:     Effort: Pulmonary effort is normal. No respiratory distress.     Breath sounds: Normal breath sounds. No wheezing.  Abdominal:     General: Bowel sounds are normal. There is no distension.     Palpations: Abdomen is  soft.     Tenderness: There is no abdominal tenderness.  Musculoskeletal:        General: No tenderness. Normal range of motion.     Cervical back: Normal range of motion and neck supple.  Skin:    General: Skin is warm and dry.  Neurological:     Mental Status: She is alert and oriented to person, place, and time.     Cranial Nerves: No cranial nerve deficit.     Deep Tendon Reflexes: Reflexes are normal and symmetric.  Psychiatric:        Behavior: Behavior normal.        Thought Content: Thought content normal.        Judgment: Judgment normal.       BP 126/80   Pulse (!) 101   Temp (!) 97.2 F (36.2 C) (Temporal)   Ht 5\' 7"  (1.702 m)   Wt 201 lb 12.8 oz (91.5 kg)   LMP 09/16/2017   SpO2 98%   BMI 31.61 kg/m      Assessment & Plan:  Afnan Mancinelli comes in today with chief complaint of Hypotension   Diagnosis and orders addressed:  1. GAD (generalized anxiety disorder) stable  2. HYPERTENSION, BENIGN Improved -Daily blood pressure log given with instructions on how to fill out and told to bring to next visit -Dash diet information given -Exercise encouraged - Stress Management  -Continue current meds  3. Hypotension, unspecified hypotension  type Resolved    Jannifer Rodney, FNP

## 2023-03-08 ENCOUNTER — Inpatient Hospital Stay: Payer: 59

## 2023-03-08 ENCOUNTER — Ambulatory Visit
Admission: RE | Admit: 2023-03-08 | Discharge: 2023-03-08 | Disposition: A | Discharge status: Home / Self Care | Payer: 59 | Source: Ambulatory Visit | Attending: Family | Admitting: Family

## 2023-03-08 DIAGNOSIS — Z1231 Encounter for screening mammogram for malignant neoplasm of breast: Secondary | ICD-10-CM

## 2023-03-10 NOTE — Progress Notes (Signed)
Brittany Durham - 52 y.o. female MRN [id number]  Date of birth: 1970/09/22  Office Visit Note: Visit Date: 02/21/2023 PCP: [name], FNP Referred by: [name], FNP  Subjective: Chief Complaint  Patient presents with   Lower Back - Pain   HPI:  Brittany Durham is a 52 y.o. female who comes in today at the request of Dr. Annell Greening for planned Left L4-5 Lumbar Transforaminal epidural steroid injection with fluoroscopic guidance.  The patient has failed conservative care including home exercise, medications, time and activity modification.  This injection will be diagnostic and hopefully therapeutic.  Please see requesting physician notes for further details and justification.   ROS Otherwise per HPI.  Assessment & Plan: Visit Diagnoses:    ICD-10-CM   1. Lumbar radiculopathy  M54.16 XR C-ARM NO REPORT    Epidural Steroid injection    methylPREDNISolone acetate (DEPO-MEDROL) injection 40 mg      Plan: No additional findings.   Meds & Orders:  Meds ordered this encounter  Medications   methylPREDNISolone acetate (DEPO-MEDROL) injection 40 mg    Orders Placed This Encounter  Procedures   XR C-ARM NO REPORT   Epidural Steroid injection    Follow-up: Return for visit to requesting provider as needed.   Procedures: No procedures performed  Lumbosacral Transforaminal Epidural Steroid Injection - Sub-Pedicular Approach with Fluoroscopic Guidance  Patient: Brittany Durham      Date of Birth: 04/23/1971 MRN: [id number] PCP: [name], FNP      Visit Date: 02/21/2023   Universal Protocol:    Date/Time: 02/21/2023  Consent Given By: the patient  Position: PRONE  Additional Comments: Vital signs were monitored before and after the procedure. Patient was prepped and draped in the usual sterile fashion. The correct patient, procedure, and site was verified.   Injection Procedure Details:   Procedure diagnoses: Lumbar radiculopathy [M54.16]    Meds  Administered:  Meds ordered this encounter  Medications   methylPREDNISolone acetate (DEPO-MEDROL) injection 40 mg    Laterality: Left  Location/Site: L4  Needle:5.0 in., 22 ga.  Short bevel or Quincke spinal needle  Needle Placement: Transforaminal  Findings:    -Comments: Excellent flow of contrast along the nerve, nerve root and into the epidural space.  Procedure Details: After squaring off the end-plates to get a true AP view, the C-arm was positioned so that an oblique view of the foramen as noted above was visualized. The target area is just inferior to the "nose of the scotty dog" or sub pedicular. The soft tissues overlying this structure were infiltrated with 2-3 ml. of 1% Lidocaine without Epinephrine.  The spinal needle was inserted toward the target using a "trajectory" view along the fluoroscope beam.  Under AP and lateral visualization, the needle was advanced so it did not puncture dura and was located close the 6 O'Clock position of the pedical in AP tracterory. Biplanar projections were used to confirm position. Aspiration was confirmed to be negative for CSF and/or blood. A 1-2 ml. volume of Isovue-250 was injected and flow of contrast was noted at each level. Radiographs were obtained for documentation purposes.   After attaining the desired flow of contrast documented above, a 0.5 to 1.0 ml test dose of 0.25% Marcaine was injected into each respective transforaminal space.  The patient was observed for 90 seconds post injection.  After no sensory deficits were reported, and normal lower extremity motor function was noted,   the above injectate was administered so  that equal amounts of the injectate were placed at each foramen (level) into the transforaminal epidural space.   Additional Comments:  No complications occurred Dressing: 2 x 2 sterile gauze and Band-Aid    Post-procedure details: Patient was observed during the procedure. Post-procedure instructions  were reviewed.  Patient left the clinic in stable condition.    Clinical History: CLINICAL DATA:  Low back pain with claudication for 3 months. No known injury or prior relevant surgery.   EXAM: MRI LUMBAR SPINE WITHOUT CONTRAST   TECHNIQUE: Multiplanar, multisequence MR imaging of the lumbar spine was performed. No intravenous contrast was administered.   COMPARISON:  Lumbar spine radiographs 10/27/2020. Abdominopelvic CT 07/26/2022.   FINDINGS: Segmentation: Conventional anatomy assumed, with the last open disc space designated L5-S1.Concordant with prior imaging.   Alignment:  Minimal convex left scoliosis.   Vertebrae: There is diffusely decreased T1 and T2 marrow signal without focally suspicious lesion, acute fracture or pars defect. The visualized sacroiliac joints appear unremarkable.   Conus medullaris: Extends to the L1 level and appears normal.   Paraspinal and other soft tissues: No significant paraspinal findings. Uterine fibroids noted.   Disc levels:   Sagittal images demonstrate mild disc bulging at T11-12 and T12-L1 without resulting spinal stenosis or nerve root encroachment.   L1-2: Disc desiccation with mild bulging and bilateral facet hypertrophy. No significant spinal stenosis or nerve root encroachment.   L2-3: Preserved disc height and hydration. Mild disc bulging eccentric to the left and mild bilateral facet hypertrophy. No spinal stenosis or nerve root encroachment.   L3-4: Preserved disc height with mild disc bulging, facet and ligamentous hypertrophy. No significant central spinal stenosis. Mild foraminal narrowing bilaterally without definite L3 nerve root encroachment.   L4-5: Preserved disc height with mild disc bulging and a moderate-sized extraforaminal disc extrusion on the left. Resulting left foraminal narrowing and L4 nerve root encroachment. Mild facet and ligamentous hypertrophy. The left lateral recess is  mildly narrowed. The spinal canal and right foramen are patent.   L5-S1: Preserved disc height and hydration. Moderate bilateral facet hypertrophy. No spinal stenosis or nerve root encroachment.   IMPRESSION: 1. Moderate-sized extraforaminal disc extrusion on the left at L4-5 with resulting left L4 nerve root encroachment. Correlate with specific radicular symptoms. 2. No other significant spinal stenosis or nerve root encroachment. 3. Nonspecific diffusely decreased T1 and T2 marrow signal without focally suspicious lesion. This may be related to anemia, smoking or obesity.     Electronically Signed   By: Carey Bullocks M.D.   On: 11/25/2022 13:21     Objective:  VS:  HT:    WT:   BMI:     BP:122/78  HR:(!) 108bpm  TEMP: ( )  RESP:  Physical Exam Vitals and nursing note reviewed.  Constitutional:      General: She is not in acute distress.    Appearance: Normal appearance. She is not ill-appearing.  HENT:     Head: Normocephalic and atraumatic.     Right Ear: External ear normal.     Left Ear: External ear normal.  Eyes:     Extraocular Movements: Extraocular movements intact.  Cardiovascular:     Rate and Rhythm: Normal rate.     Pulses: Normal pulses.  Pulmonary:     Effort: Pulmonary effort is normal. No respiratory distress.  Abdominal:     General: There is no distension.     Palpations: Abdomen is soft.  Musculoskeletal:  General: Tenderness present.     Cervical back: Neck supple.     Right lower leg: No edema.     Left lower leg: No edema.     Comments: Patient has good distal strength with no pain over the greater trochanters.  No clonus or focal weakness.  Skin:    Findings: No erythema, lesion or rash.  Neurological:     General: No focal deficit present.     Mental Status: She is alert and oriented to person, place, and time.     Sensory: No sensory deficit.     Motor: No weakness or abnormal muscle tone.     Coordination: Coordination  normal.  Psychiatric:        Mood and Affect: Mood normal.        Behavior: Behavior normal.      Imaging: No results found.

## 2023-03-10 NOTE — Procedures (Signed)
Lumbosacral Transforaminal Epidural Steroid Injection - Sub-Pedicular Approach with Fluoroscopic Guidance  Patient: Brittany Durham      Date of Birth: November 22, 1970 MRN: [id number] PCP: [name], FNP      Visit Date: 02/21/2023   Universal Protocol:    Date/Time: 02/21/2023  Consent Given By: the patient  Position: PRONE  Additional Comments: Vital signs were monitored before and after the procedure. Patient was prepped and draped in the usual sterile fashion. The correct patient, procedure, and site was verified.   Injection Procedure Details:   Procedure diagnoses: Lumbar radiculopathy [M54.16]    Meds Administered:  Meds ordered this encounter  Medications   methylPREDNISolone acetate (DEPO-MEDROL) injection 40 mg    Laterality: Left  Location/Site: L4  Needle:5.0 in., 22 ga.  Short bevel or Quincke spinal needle  Needle Placement: Transforaminal  Findings:    -Comments: Excellent flow of contrast along the nerve, nerve root and into the epidural space.  Procedure Details: After squaring off the end-plates to get a true AP view, the C-arm was positioned so that an oblique view of the foramen as noted above was visualized. The target area is just inferior to the "nose of the scotty dog" or sub pedicular. The soft tissues overlying this structure were infiltrated with 2-3 ml. of 1% Lidocaine without Epinephrine.  The spinal needle was inserted toward the target using a "trajectory" view along the fluoroscope beam.  Under AP and lateral visualization, the needle was advanced so it did not puncture dura and was located close the 6 O'Clock position of the pedical in AP tracterory. Biplanar projections were used to confirm position. Aspiration was confirmed to be negative for CSF and/or blood. A 1-2 ml. volume of Isovue-250 was injected and flow of contrast was noted at each level. Radiographs were obtained for documentation purposes.   After attaining the desired flow  of contrast documented above, a 0.5 to 1.0 ml test dose of 0.25% Marcaine was injected into each respective transforaminal space.  The patient was observed for 90 seconds post injection.  After no sensory deficits were reported, and normal lower extremity motor function was noted,   the above injectate was administered so that equal amounts of the injectate were placed at each foramen (level) into the transforaminal epidural space.   Additional Comments:  No complications occurred Dressing: 2 x 2 sterile gauze and Band-Aid    Post-procedure details: Patient was observed during the procedure. Post-procedure instructions were reviewed.  Patient left the clinic in stable condition.

## 2023-03-15 ENCOUNTER — Inpatient Hospital Stay: Payer: 59 | Attending: Hematology

## 2023-03-15 VITALS — BP 138/88 | HR 76 | Temp 97.2°F | Resp 17 | Ht 67.0 in | Wt 200.3 lb

## 2023-03-15 DIAGNOSIS — D500 Iron deficiency anemia secondary to blood loss (chronic): Secondary | ICD-10-CM

## 2023-03-15 DIAGNOSIS — D509 Iron deficiency anemia, unspecified: Secondary | ICD-10-CM | POA: Insufficient documentation

## 2023-03-15 DIAGNOSIS — D62 Acute posthemorrhagic anemia: Secondary | ICD-10-CM

## 2023-03-15 MED ORDER — METHYLPREDNISOLONE SODIUM SUCC 125 MG IJ SOLR
125.0000 mg | Freq: Once | INTRAMUSCULAR | Status: AC
  Administered 2023-03-15: 125 mg via INTRAVENOUS
  Filled 2023-03-15: qty 2

## 2023-03-15 MED ORDER — CETIRIZINE HCL 10 MG/ML IV SOLN
10.0000 mg | Freq: Once | INTRAVENOUS | Status: AC
  Administered 2023-03-15: 10 mg via INTRAVENOUS
  Filled 2023-03-15: qty 1

## 2023-03-15 MED ORDER — FAMOTIDINE IN NACL 20-0.9 MG/50ML-% IV SOLN
20.0000 mg | Freq: Once | INTRAVENOUS | Status: AC
  Administered 2023-03-15: 20 mg via INTRAVENOUS
  Filled 2023-03-15: qty 50

## 2023-03-15 MED ORDER — SODIUM CHLORIDE 0.9 % IV SOLN
Freq: Once | INTRAVENOUS | Status: AC
Start: 2023-03-15 — End: 2023-03-15

## 2023-03-15 MED ORDER — SODIUM CHLORIDE 0.9 % IV SOLN
510.0000 mg | Freq: Once | INTRAVENOUS | Status: AC
  Administered 2023-03-15: 510 mg via INTRAVENOUS
  Filled 2023-03-15: qty 510

## 2023-03-15 NOTE — Patient Instructions (Signed)
 [facility]  Discharge Instructions: Thank you for choosing [facility] to provide your oncology and hematology care.  If you have a lab appointment with the Cancer Center - please note that after April 8th, 2024, all labs will be drawn in the cancer center.  You do not have to check in or register with the main entrance as you have in the past but will complete your check-in in the cancer center.  Wear comfortable clothing and clothing appropriate for easy access to any Portacath or PICC line.   We strive to give you quality time with your provider. You may need to reschedule your appointment if you arrive late (15 or more minutes).  Arriving late affects you and other patients whose appointments are after yours.  Also, if you miss three or more appointments without notifying the office, you may be dismissed from the clinic at the provider's discretion.      For prescription refill requests, have your pharmacy contact our office and allow 72 hours for refills to be completed.    Today you received the following chemotherapy and/or immunotherapy agents Feraheme. Ferumoxytol Injection What is this medication? FERUMOXYTOL (FER ue MOX i tol) treats low levels of iron in your body (iron deficiency anemia). Iron is a mineral that plays an important role in making red blood cells, which carry oxygen from your lungs to the rest of your body. This medicine may be used for other purposes; ask your health care provider or pharmacist if you have questions. COMMON BRAND NAME(S): Feraheme What should I tell my care team before I take this medication? They need to know if you have any of these conditions: Anemia not caused by low iron levels High levels of iron in the blood Magnetic resonance imaging (MRI) test scheduled An unusual or allergic reaction to iron, other medications, foods, dyes, or preservatives Pregnant or trying to get pregnant Breastfeeding How should I  use this medication? This medication is injected into a vein. It is given by your care team in a hospital or clinic setting. Talk to your care team the use of this medication in children. Special care may be needed. Overdosage: If you think you have taken too much of this medicine contact a poison control center or emergency room at once. NOTE: This medicine is only for you. Do not share this medicine with others. What if I miss a dose? It is important not to miss your dose. Call your care team if you are unable to keep an appointment. What may interact with this medication? Other iron products This list may not describe all possible interactions. Give your health care provider a list of all the medicines, herbs, non-prescription drugs, or dietary supplements you use. Also tell them if you smoke, drink alcohol, or use illegal drugs. Some items may interact with your medicine. What should I watch for while using this medication? Visit your care team regularly. Tell your care team if your symptoms do not start to get better or if they get worse. You may need blood work done while you are taking this medication. You may need to follow a special diet. Talk to your care team. Foods that contain iron include: whole grains/cereals, dried fruits, beans, or peas, leafy green vegetables, and organ meats (liver, kidney). What side effects may I notice from receiving this medication? Side effects that you should report to your care team as soon as possible: Allergic reactions--skin rash, itching, hives, swelling  of the face, lips, tongue, or throat Low blood pressure--dizziness, feeling faint or lightheaded, blurry vision Shortness of breath Side effects that usually do not require medical attention (report to your care team if they continue or are bothersome): Flushing Headache Joint pain Muscle pain Nausea Pain, redness, or irritation at injection site This list may not describe all possible side  effects. Call your doctor for medical advice about side effects. You may report side effects to FDA at 1-800-FDA-1088. Where should I keep my medication? This medication is given in a hospital or clinic. It will not be stored at home. NOTE: This sheet is a summary. It may not cover all possible information. If you have questions about this medicine, talk to your doctor, pharmacist, or health care provider.  2024 Elsevier/Gold Standard (2022-10-05 00:00:00)       To help prevent nausea and vomiting after your treatment, we encourage you to take your nausea medication as directed.  BELOW ARE SYMPTOMS THAT SHOULD BE REPORTED IMMEDIATELY: *FEVER GREATER THAN 100.4 F (38 C) OR HIGHER *CHILLS OR SWEATING *NAUSEA AND VOMITING THAT IS NOT CONTROLLED WITH YOUR NAUSEA MEDICATION *UNUSUAL SHORTNESS OF BREATH *UNUSUAL BRUISING OR BLEEDING *URINARY PROBLEMS (pain or burning when urinating, or frequent urination) *BOWEL PROBLEMS (unusual diarrhea, constipation, pain near the anus) TENDERNESS IN MOUTH AND THROAT WITH OR WITHOUT PRESENCE OF ULCERS (sore throat, sores in mouth, or a toothache) UNUSUAL RASH, SWELLING OR PAIN  UNUSUAL VAGINAL DISCHARGE OR ITCHING   Items with * indicate a potential emergency and should be followed up as soon as possible or go to the Emergency Department if any problems should occur.  Please show the CHEMOTHERAPY ALERT CARD or IMMUNOTHERAPY ALERT CARD at check-in to the Emergency Department and triage nurse.  Should you have questions after your visit or need to cancel or reschedule your appointment, please contact [facility] [phone]  and follow the prompts.  Office hours are 8:00 a.m. to 4:30 p.m. Monday - Friday. Please note that voicemails left after 4:00 p.m. may not be returned until the following business day.  We are closed weekends and major holidays. You have access to a nurse at all times for urgent questions. Please call the main number  to the clinic [phone] and follow the prompts.  For any non-urgent questions, you may also contact your provider using MyChart. We now offer e-Visits for anyone 44 and older to request care online for non-urgent symptoms. For details visit [email or web address].   Also download the MyChart app! Go to the app store, search "MyChart", open the app, select [facility], and log in with your MyChart username and password.

## 2023-03-15 NOTE — Progress Notes (Signed)
Patient presents today for feraheme infusion. Patient denies any side effects related to last treatment.   Treatment given today per MD orders. Tolerated infusion without adverse affects. Vital signs stable. No complaints at this time. Discharged from clinic ambulatory in stable condition. Alert and oriented x 3. F/U with [facility] as scheduled.

## 2023-03-21 ENCOUNTER — Ambulatory Visit (INDEPENDENT_AMBULATORY_CARE_PROVIDER_SITE_OTHER): Payer: 59 | Admitting: Orthopaedic Surgery

## 2023-03-21 ENCOUNTER — Encounter: Payer: Self-pay | Admitting: Orthopaedic Surgery

## 2023-03-21 VITALS — Ht 67.0 in | Wt 200.0 lb

## 2023-03-21 DIAGNOSIS — M5416 Radiculopathy, lumbar region: Secondary | ICD-10-CM

## 2023-03-21 DIAGNOSIS — M5126 Other intervertebral disc displacement, lumbar region: Secondary | ICD-10-CM | POA: Diagnosis not present

## 2023-03-21 NOTE — Progress Notes (Signed)
Office Visit Note   Patient: Brittany Durham           Date of Birth: 05/03/1971           MRN: [id number] Visit Date: 03/21/2023              Requested by: [name], FNP [address] [place],  [place] [address] PCP: [name], FNP   Assessment & Plan: Visit Diagnoses:  1. Lumbar radiculopathy   2. Protrusion of lumbar intervertebral disc     Plan: Will seek authorization for second epidural.  This is not effective then we will have to proceed with microdiscectomy for the left L4-5 foraminal/extraforaminal disc herniation.  MRI scan was reviewed treatment options discussed.  She is in agreement for second epidural.  Follow-Up Instructions: No follow-ups on file.   Orders:  Orders Placed This Encounter  Procedures   Ambulatory referral to Physical Medicine Rehab   No orders of the defined types were placed in this encounter.     Procedures: No procedures performed   Clinical Data: No additional findings.   Subjective: Chief Complaint  Patient presents with   Lower Back - Pain, Follow-up    HPI follow-up left foraminal extraforaminal disc protrusion L4-5 left.  50% better from her epidural 1 month ago with Dr. Alvester Morin on 02/21/2023.  She has had some recurrence of symptoms.  She states she fell and her daughter's yard a week ago.  She is use Tylenol heating pad and ice.  Today's visit we reviewed her MRI scan again.  Review of Systems updated unchanged.   Objective: Vital Signs: Ht 5\' 7"  (1.702 m)   Wt 200 lb (90.7 kg)   LMP 09/16/2017   BMI 31.32 kg/m   Physical Exam Constitutional:      Appearance: She is well-developed.  HENT:     Head: Normocephalic.     Right Ear: External ear normal.     Left Ear: External ear normal. There is no impacted cerumen.  Eyes:     Pupils: Pupils are equal, round, and reactive to light.  Neck:     Thyroid: No thyromegaly.     Trachea: No tracheal deviation.  Cardiovascular:     Rate and Rhythm: Normal  rate.  Pulmonary:     Effort: Pulmonary effort is normal.  Abdominal:     Palpations: Abdomen is soft.  Musculoskeletal:     Cervical back: No rigidity.  Skin:    General: Skin is warm and dry.  Neurological:     Mental Status: She is alert and oriented to person, place, and time.  Psychiatric:        Behavior: Behavior normal.     Ortho Exam pain with straight leg raising on the left 90 degrees.  Anterior tib is strong gastrocsoleus is intact.  Specialty Comments:  CLINICAL DATA:  Low back pain with claudication for 3 months. No known injury or prior relevant surgery.   EXAM: MRI LUMBAR SPINE WITHOUT CONTRAST   TECHNIQUE: Multiplanar, multisequence MR imaging of the lumbar spine was performed. No intravenous contrast was administered.   COMPARISON:  Lumbar spine radiographs 10/27/2020. Abdominopelvic CT 07/26/2022.   FINDINGS: Segmentation: Conventional anatomy assumed, with the last open disc space designated L5-S1.Concordant with prior imaging.   Alignment:  Minimal convex left scoliosis.   Vertebrae: There is diffusely decreased T1 and T2 marrow signal without focally suspicious lesion, acute fracture or pars defect. The visualized sacroiliac joints appear unremarkable.  Conus medullaris: Extends to the L1 level and appears normal.   Paraspinal and other soft tissues: No significant paraspinal findings. Uterine fibroids noted.   Disc levels:   Sagittal images demonstrate mild disc bulging at T11-12 and T12-L1 without resulting spinal stenosis or nerve root encroachment.   L1-2: Disc desiccation with mild bulging and bilateral facet hypertrophy. No significant spinal stenosis or nerve root encroachment.   L2-3: Preserved disc height and hydration. Mild disc bulging eccentric to the left and mild bilateral facet hypertrophy. No spinal stenosis or nerve root encroachment.   L3-4: Preserved disc height with mild disc bulging, facet and ligamentous  hypertrophy. No significant central spinal stenosis. Mild foraminal narrowing bilaterally without definite L3 nerve root encroachment.   L4-5: Preserved disc height with mild disc bulging and a moderate-sized extraforaminal disc extrusion on the left. Resulting left foraminal narrowing and L4 nerve root encroachment. Mild facet and ligamentous hypertrophy. The left lateral recess is mildly narrowed. The spinal canal and right foramen are patent.   L5-S1: Preserved disc height and hydration. Moderate bilateral facet hypertrophy. No spinal stenosis or nerve root encroachment.   IMPRESSION: 1. Moderate-sized extraforaminal disc extrusion on the left at L4-5 with resulting left L4 nerve root encroachment. Correlate with specific radicular symptoms. 2. No other significant spinal stenosis or nerve root encroachment. 3. Nonspecific diffusely decreased T1 and T2 marrow signal without focally suspicious lesion. This may be related to anemia, smoking or obesity.     Electronically Signed   By: Carey Bullocks M.D.   On: 11/25/2022 13:21  Imaging: No results found.   PMFS History: Patient Active Problem List   Diagnosis Date Noted   Protrusion of lumbar intervertebral disc 11/29/2022   Biliary dyskinesia 02/01/2021   Right flank pain 10/27/2020   Iron deficiency anemia due to chronic blood loss 09/15/2020   Symptomatic anemia 07/20/2020   Depression, major, single episode, moderate (HCC) 07/15/2020   Vertigo 01/26/2020   GAD (generalized anxiety disorder) 07/28/2019   Radiculopathy, cervical region 07/16/2019   S/P cervical spinal fusion 03/20/2019   Osteoarthritis 11/18/2017   Insomnia 11/18/2017   Synovitis, villonodular, knee, right 10/28/2017   Tear of lateral meniscus of right knee, current 10/28/2017   Chondromalacia patellae, 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   Language difficulty 09/25/2016   Nausea with vomiting 07/18/2016   PUD (peptic ulcer disease) 07/18/2016   Slurred  speech    GI bleed 05/02/2016   Sinus tachycardia 05/02/2016   Acute blood loss anemia 05/02/2016   Speech disorder 03/31/2015   Thyroid nodule 12/14/2014   Left knee pain 12/14/2014   Vitamin D deficiency 06/30/2014   Gastroesophageal reflux disease without esophagitis 06/29/2014   SYNCOPE 03/02/2010   HYPERTENSION, BENIGN 03/01/2010   Past Medical History:  Diagnosis Date   Anemia    Asthma    childhood   Carotidynia    COPD (chronic obstructive pulmonary disease) (HCC)    DDD (degenerative disc disease), cervical    GERD (gastroesophageal reflux disease)    Hypertension    Knee pain    left meniscus tear s/p repair 11/2014   Seizures (HCC)    last seizure was over 10 years ago, unknown etiology   Stuttering    patient reports this started after her knee surgery in 11/2014.    Thyroid nodule     Family History  Problem Relation Age of Onset   Hypertension Mother    Lung cancer Father    Hypertension Brother    Colon cancer Neg  Hx    Gastric cancer Neg Hx    Esophageal cancer Neg Hx     Past Surgical History:  Procedure Laterality Date   ANTERIOR CERVICAL DECOMP/DISCECTOMY FUSION N/A 03/20/2019   Procedure: Anterior Cervical Decompression Fusion - Cervical Four-Cervical Five - Cervical Five-Cervical Six;  Surgeon: Tia Alert, MD;  [facility];  Service: Neurosurgery;  Laterality: N/A;  Anterior Cervical Decompression Fusion - Cervical Four-Cervical Five - Cervical Five-Cervical Six   BIOPSY  09/20/2020   Procedure: BIOPSY;  Surgeon: Corbin Ade, MD;  Location: [facility];  Service: Endoscopy;;   COLONOSCOPY N/A 08/23/2016   Procedure: COLONOSCOPY;  Surgeon: Corbin Ade, MD;  Location: [facility];  Service: Endoscopy;  Laterality: N/A;  1:00pm   COLONOSCOPY N/A 08/24/2020   Diverticulosis in the entire examined colon. Redundant colon. Exam otherwise normal   ESOPHAGOGASTRODUODENOSCOPY N/A 05/03/2016   Procedure: ESOPHAGOGASTRODUODENOSCOPY (EGD);   Surgeon: Corbin Ade, MD;  Location: [facility];  Service: Endoscopy;  Laterality: N/A;   ESOPHAGOGASTRODUODENOSCOPY N/A 08/23/2016   Procedure: ESOPHAGOGASTRODUODENOSCOPY (EGD);  Surgeon: Corbin Ade, MD;  Location: [facility];  Service: Endoscopy;  Laterality: N/A;   ESOPHAGOGASTRODUODENOSCOPY (EGD) WITH PROPOFOL N/A 09/20/2020   normal esophagus. Large hiatal hernia. Multiple gastric erosions (Cameron lesions). Normal duodenal bulb and second portion of the duodenum. Suspicion Sheria Lang lesions causing intermittent bleeding   KNEE ARTHROSCOPY Right    KNEE ARTHROSCOPY WITH MEDIAL MENISECTOMY Left 12/09/2014   Procedure: KNEE ARTHROSCOPY WITH PARTIAL MEDIAL AND LATERAL MENISECTOMY;  Surgeon: Darreld Mclean, MD;  Location: [facility];  Service: Orthopedics;  Laterality: Left;   TUBAL LIGATION     WISDOM TOOTH EXTRACTION     Social History   Occupational History   Occupation: [employer]ployed    Comment: Disability  Tobacco Use   Smoking status: Never   Smokeless tobacco: Never  Vaping Use   Vaping status: Never Used  Substance and Sexual Activity   Alcohol use: No   Drug use: No   Sexual activity: Not Currently    Birth control/protection: Surgical

## 2023-03-22 ENCOUNTER — Inpatient Hospital Stay: Payer: 59

## 2023-03-22 VITALS — BP 133/80 | HR 88 | Temp 98.5°F | Resp 20

## 2023-03-22 DIAGNOSIS — D509 Iron deficiency anemia, unspecified: Secondary | ICD-10-CM | POA: Diagnosis not present

## 2023-03-22 DIAGNOSIS — D500 Iron deficiency anemia secondary to blood loss (chronic): Secondary | ICD-10-CM

## 2023-03-22 DIAGNOSIS — D62 Acute posthemorrhagic anemia: Secondary | ICD-10-CM

## 2023-03-22 MED ORDER — METHYLPREDNISOLONE SODIUM SUCC 125 MG IJ SOLR
125.0000 mg | Freq: Once | INTRAMUSCULAR | Status: AC
  Administered 2023-03-22: 125 mg via INTRAVENOUS
  Filled 2023-03-22: qty 2

## 2023-03-22 MED ORDER — SODIUM CHLORIDE 0.9 % IV SOLN: Freq: Once | INTRAVENOUS | Status: AC

## 2023-03-22 MED ORDER — FAMOTIDINE IN NACL 20-0.9 MG/50ML-% IV SOLN
20.0000 mg | Freq: Once | INTRAVENOUS | Status: AC
  Administered 2023-03-22: 20 mg via INTRAVENOUS
  Filled 2023-03-22: qty 50

## 2023-03-22 MED ORDER — CETIRIZINE HCL 10 MG/ML IV SOLN
10.0000 mg | Freq: Once | INTRAVENOUS | Status: AC
  Administered 2023-03-22: 10 mg via INTRAVENOUS
  Filled 2023-03-22: qty 1

## 2023-03-22 MED ORDER — SODIUM CHLORIDE 0.9 % IV SOLN
510.0000 mg | Freq: Once | INTRAVENOUS | Status: AC
  Administered 2023-03-22: 510 mg via INTRAVENOUS
  Filled 2023-03-22: qty 510

## 2023-03-22 NOTE — Patient Instructions (Signed)
 Ferumoxytol Injection What is this medication? FERUMOXYTOL (FER ue MOX i tol) treats low levels of iron in your body (iron deficiency anemia). Iron is a mineral that plays an important role in making red blood cells, which carry oxygen from your lungs to the rest of your body. This medicine may be used for other purposes; ask your health care provider or pharmacist if you have questions. COMMON BRAND NAME(S): Feraheme What should I tell my care team before I take this medication? They need to know if you have any of these conditions: Anemia not caused by low iron levels High levels of iron in the blood Magnetic resonance imaging (MRI) test scheduled An unusual or allergic reaction to iron, other medications, foods, dyes, or preservatives Pregnant or trying to get pregnant Breastfeeding How should I use this medication? This medication is injected into a vein. It is given by your care team in a hospital or clinic setting. Talk to your care team the use of this medication in children. Special care may be needed. Overdosage: If you think you have taken too much of this medicine contact a poison control center or emergency room at once. NOTE: This medicine is only for you. Do not share this medicine with others. What if I miss a dose? It is important not to miss your dose. Call your care team if you are unable to keep an appointment. What may interact with this medication? Other iron products This list may not describe all possible interactions. Give your health care provider a list of all the medicines, herbs, non-prescription drugs, or dietary supplements you use. Also tell them if you smoke, drink alcohol, or use illegal drugs. Some items may interact with your medicine. What should I watch for while using this medication? Visit your care team regularly. Tell your care team if your symptoms do not start to get better or if they get worse. You may need blood work done while you are taking this  medication. You may need to follow a special diet. Talk to your care team. Foods that contain iron include: whole grains/cereals, dried fruits, beans, or peas, leafy green vegetables, and organ meats (liver, kidney). What side effects may I notice from receiving this medication? Side effects that you should report to your care team as soon as possible: Allergic reactions--skin rash, itching, hives, swelling of the face, lips, tongue, or throat Low blood pressure--dizziness, feeling faint or lightheaded, blurry vision Shortness of breath Side effects that usually do not require medical attention (report to your care team if they continue or are bothersome): Flushing Headache Joint pain Muscle pain Nausea Pain, redness, or irritation at injection site This list may not describe all possible side effects. Call your doctor for medical advice about side effects. You may report side effects to FDA at 1-800-FDA-1088. Where should I keep my medication? This medication is given in a hospital or clinic. It will not be stored at home. NOTE: This sheet is a summary. It may not cover all possible information. If you have questions about this medicine, talk to your doctor, pharmacist, or health care provider.  2024 Elsevier/Gold Standard (2022-10-05 00:00:00)

## 2023-03-22 NOTE — Progress Notes (Signed)
Patient tolerated iron infusion with no complaints voiced.  Peripheral IV site clean and dry with good blood return noted before and after infusion.  Band aid applied. Pt observed for 30 minutes post feraheme infusion with no complications occurring. VSS with discharge and left in satisfactory condition with no s/s of distress noted. All follow ups as scheduled.   Shy Guall[employer]

## 2023-03-28 ENCOUNTER — Other Ambulatory Visit: Payer: Self-pay | Admitting: Family

## 2023-03-28 DIAGNOSIS — F321 Major depressive disorder, single episode, moderate: Secondary | ICD-10-CM

## 2023-03-28 DIAGNOSIS — F411 Generalized anxiety disorder: Secondary | ICD-10-CM

## 2023-04-04 ENCOUNTER — Ambulatory Visit: Payer: 59 | Admitting: Physical Medicine and Rehabilitation

## 2023-04-04 ENCOUNTER — Other Ambulatory Visit: Payer: Self-pay

## 2023-04-04 DIAGNOSIS — M5416 Radiculopathy, lumbar region: Secondary | ICD-10-CM | POA: Diagnosis not present

## 2023-04-04 MED ORDER — METHYLPREDNISOLONE ACETATE 40 MG/ML IJ SUSP
40.0000 mg | Freq: Once | INTRAMUSCULAR | Status: AC
  Administered 2023-04-04: 40 mg

## 2023-04-04 NOTE — Procedures (Signed)
Lumbosacral Transforaminal Epidural Steroid Injection - Sub-Pedicular Approach with Fluoroscopic Guidance  Patient: Brittany Durham      Date of Birth: November 18, 1970 MRN: [id number] PCP: [name], FNP      Visit Date: 04/04/2023   Universal Protocol:    Date/Time: 04/04/2023  Consent Given By: the patient  Position: PRONE  Additional Comments: Vital signs were monitored before and after the procedure. Patient was prepped and draped in the usual sterile fashion. The correct patient, procedure, and site was verified.   Injection Procedure Details:   Procedure diagnoses: Lumbar radiculopathy [M54.16]    Meds Administered:  Meds ordered this encounter  Medications   methylPREDNISolone acetate (DEPO-MEDROL) injection 40 mg    Laterality: Left  Location/Site: L4  Needle:5.0 in., 22 ga.  Short bevel or Quincke spinal needle  Needle Placement: Transforaminal  Findings:    -Comments: Excellent flow of contrast along the nerve, nerve root and into the epidural space.  Procedure Details: After squaring off the end-plates to get a true AP view, the C-arm was positioned so that an oblique view of the foramen as noted above was visualized. The target area is just inferior to the "nose of the scotty dog" or sub pedicular. The soft tissues overlying this structure were infiltrated with 2-3 ml. of 1% Lidocaine without Epinephrine.  The spinal needle was inserted toward the target using a "trajectory" view along the fluoroscope beam.  Under AP and lateral visualization, the needle was advanced so it did not puncture dura and was located close the 6 O'Clock position of the pedical in AP tracterory. Biplanar projections were used to confirm position. Aspiration was confirmed to be negative for CSF and/or blood. A 1-2 ml. volume of Isovue-250 was injected and flow of contrast was noted at each level. Radiographs were obtained for documentation purposes.   After attaining the desired flow  of contrast documented above, a 0.5 to 1.0 ml test dose of 0.25% Marcaine was injected into each respective transforaminal space.  The patient was observed for 90 seconds post injection.  After no sensory deficits were reported, and normal lower extremity motor function was noted,   the above injectate was administered so that equal amounts of the injectate were placed at each foramen (level) into the transforaminal epidural space.   Additional Comments:  No complications occurred Dressing: 2 x 2 sterile gauze and Band-Aid    Post-procedure details: Patient was observed during the procedure. Post-procedure instructions were reviewed.  Patient left the clinic in stable condition.

## 2023-04-04 NOTE — Progress Notes (Signed)
Brittany Durham - 52 y.o. female MRN [id number]  Date of birth: [birth year]  Office Visit Note: Visit Date: 04/04/2023 PCP: [name], FNP Referred by: [name], FNP  Subjective: Chief Complaint  Patient presents with   Lower Back - Pain   HPI:  Brittany Durham is a 52 y.o. female who comes in today at the request of Dr. Annell Greening for planned Left L4-5 Lumbar Transforaminal epidural steroid injection with fluoroscopic guidance.  The patient has failed conservative care including home exercise, medications, time and activity modification.  This injection will be diagnostic and hopefully therapeutic.  Please see requesting physician notes for further details and justification.  She has disc herniation at this level at L4-5 with prior left L4 transforaminal injection given more than 50% relief for a while.  Dr. Ophelia Charter would like to repeat this injection 1 time to see if it gives her better relief and also to be more diagnostic.  I did review the prior images from the last injection and actually the images show what appears to be right sided injection but I feel like the fluoroscopy screen was flipped as it we clearly did a left-sided injection that day as the notes indicate.   ROS Otherwise per HPI.  Assessment & Plan: Visit Diagnoses:    ICD-10-CM   1. Lumbar radiculopathy  M54.16 methylPREDNISolone acetate (DEPO-MEDROL) injection 40 mg    XR C-ARM NO REPORT    Epidural Steroid injection      Plan: No additional findings.   Meds & Orders:  Meds ordered this encounter  Medications   methylPREDNISolone acetate (DEPO-MEDROL) injection 40 mg    Orders Placed This Encounter  Procedures   XR C-ARM NO REPORT   Epidural Steroid injection    Follow-up: Return for visit to requesting provider as needed.   Procedures: No procedures performed  Lumbosacral Transforaminal Epidural Steroid Injection - Sub-Pedicular Approach with Fluoroscopic Guidance  Patient: Brittany Durham      Date of Birth: 18-Dec-1970 MRN: [id number] PCP: [name], FNP      Visit Date: 04/04/2023   Universal Protocol:    Date/Time: 04/04/2023  Consent Given By: the patient  Position: PRONE  Additional Comments: Vital signs were monitored before and after the procedure. Patient was prepped and draped in the usual sterile fashion. The correct patient, procedure, and site was verified.   Injection Procedure Details:   Procedure diagnoses: Lumbar radiculopathy [M54.16]    Meds Administered:  Meds ordered this encounter  Medications   methylPREDNISolone acetate (DEPO-MEDROL) injection 40 mg    Laterality: Left  Location/Site: L4  Needle:5.0 in., 22 ga.  Short bevel or Quincke spinal needle  Needle Placement: Transforaminal  Findings:    -Comments: Excellent flow of contrast along the nerve, nerve root and into the epidural space.  Procedure Details: After squaring off the end-plates to get a true AP view, the C-arm was positioned so that an oblique view of the foramen as noted above was visualized. The target area is just inferior to the "nose of the scotty dog" or sub pedicular. The soft tissues overlying this structure were infiltrated with 2-3 ml. of 1% Lidocaine without Epinephrine.  The spinal needle was inserted toward the target using a "trajectory" view along the fluoroscope beam.  Under AP and lateral visualization, the needle was advanced so it did not puncture dura and was located close the 6 O'Clock position of the pedical in AP tracterory. Biplanar projections were used to confirm  position. Aspiration was confirmed to be negative for CSF and/or blood. A 1-2 ml. volume of Isovue-250 was injected and flow of contrast was noted at each level. Radiographs were obtained for documentation purposes.   After attaining the desired flow of contrast documented above, a 0.5 to 1.0 ml test dose of 0.25% Marcaine was injected into each respective transforaminal  space.  The patient was observed for 90 seconds post injection.  After no sensory deficits were reported, and normal lower extremity motor function was noted,   the above injectate was administered so that equal amounts of the injectate were placed at each foramen (level) into the transforaminal epidural space.   Additional Comments:  No complications occurred Dressing: 2 x 2 sterile gauze and Band-Aid    Post-procedure details: Patient was observed during the procedure. Post-procedure instructions were reviewed.  Patient left the clinic in stable condition.    Clinical History: CLINICAL DATA:  Low back pain with claudication for 3 months. No known injury or prior relevant surgery.   EXAM: MRI LUMBAR SPINE WITHOUT CONTRAST   TECHNIQUE: Multiplanar, multisequence MR imaging of the lumbar spine was performed. No intravenous contrast was administered.   COMPARISON:  Lumbar spine radiographs 10/27/2020. Abdominopelvic CT 07/26/2022.   FINDINGS: Segmentation: Conventional anatomy assumed, with the last open disc space designated L5-S1.Concordant with prior imaging.   Alignment:  Minimal convex left scoliosis.   Vertebrae: There is diffusely decreased T1 and T2 marrow signal without focally suspicious lesion, acute fracture or pars defect. The visualized sacroiliac joints appear unremarkable.   Conus medullaris: Extends to the L1 level and appears normal.   Paraspinal and other soft tissues: No significant paraspinal findings. Uterine fibroids noted.   Disc levels:   Sagittal images demonstrate mild disc bulging at T11-12 and T12-L1 without resulting spinal stenosis or nerve root encroachment.   L1-2: Disc desiccation with mild bulging and bilateral facet hypertrophy. No significant spinal stenosis or nerve root encroachment.   L2-3: Preserved disc height and hydration. Mild disc bulging eccentric to the left and mild bilateral facet hypertrophy. No spinal stenosis or  nerve root encroachment.   L3-4: Preserved disc height with mild disc bulging, facet and ligamentous hypertrophy. No significant central spinal stenosis. Mild foraminal narrowing bilaterally without definite L3 nerve root encroachment.   L4-5: Preserved disc height with mild disc bulging and a moderate-sized extraforaminal disc extrusion on the left. Resulting left foraminal narrowing and L4 nerve root encroachment. Mild facet and ligamentous hypertrophy. The left lateral recess is mildly narrowed. The spinal canal and right foramen are patent.   L5-S1: Preserved disc height and hydration. Moderate bilateral facet hypertrophy. No spinal stenosis or nerve root encroachment.   IMPRESSION: 1. Moderate-sized extraforaminal disc extrusion on the left at L4-5 with resulting left L4 nerve root encroachment. Correlate with specific radicular symptoms. 2. No other significant spinal stenosis or nerve root encroachment. 3. Nonspecific diffusely decreased T1 and T2 marrow signal without focally suspicious lesion. This may be related to anemia, smoking or obesity.     Electronically Signed   By: Carey Bullocks M.D.   On: 11/25/2022 13:21     Objective:  VS:  HT:    WT:   BMI:     BP:   HR: bpm  TEMP: ( )  RESP:  Physical Exam Vitals and nursing note reviewed.  Constitutional:      General: She is not in acute distress.    Appearance: Normal appearance. She is not ill-appearing.  HENT:  Head: Normocephalic and atraumatic.     Right Ear: External ear normal.     Left Ear: External ear normal.  Eyes:     Extraocular Movements: Extraocular movements intact.  Cardiovascular:     Rate and Rhythm: Normal rate.     Pulses: Normal pulses.  Pulmonary:     Effort: Pulmonary effort is normal. No respiratory distress.  Abdominal:     General: There is no distension.     Palpations: Abdomen is soft.  Musculoskeletal:        General: Tenderness present.     Cervical back: Neck  supple.     Right lower leg: No edema.     Left lower leg: No edema.     Comments: Patient has good distal strength with no pain over the greater trochanters.  No clonus or focal weakness.  Skin:    Findings: No erythema, lesion or rash.  Neurological:     General: No focal deficit present.     Mental Status: She is alert and oriented to person, place, and time.     Sensory: No sensory deficit.     Motor: No weakness or abnormal muscle tone.     Coordination: Coordination normal.  Psychiatric:        Mood and Affect: Mood normal.        Behavior: Behavior normal.      Imaging: XR C-ARM NO REPORT  Result Date: 04/04/2023 Please see Notes tab for imaging impression.

## 2023-04-04 NOTE — Patient Instructions (Signed)

## 2023-04-04 NOTE — Progress Notes (Signed)
Functional Pain Scale - descriptive words and definitions  Distracting (5)    Aware of pain/able to complete some ADL's but limited by pain/sleep is affected and active distractions are only slightly useful. Moderate range order  Average Pain 5  L leg pain.   +Driver, -BT, -Dye Allergies.

## 2023-04-10 ENCOUNTER — Inpatient Hospital Stay: Payer: 59

## 2023-04-10 DIAGNOSIS — D509 Iron deficiency anemia, unspecified: Secondary | ICD-10-CM | POA: Diagnosis not present

## 2023-04-10 DIAGNOSIS — D500 Iron deficiency anemia secondary to blood loss (chronic): Secondary | ICD-10-CM

## 2023-04-10 LAB — CBC WITH DIFFERENTIAL/PLATELET
Abs Immature Granulocytes: 0.03 10*3/uL (ref 0.00–0.07)
Basophils Absolute: 0 10*3/uL (ref 0.0–0.1)
Basophils Relative: 0 %
Eosinophils Absolute: 0.2 10*3/uL (ref 0.0–0.5)
Eosinophils Relative: 4 %
HCT: 33.6 % — ABNORMAL LOW (ref 36.0–46.0)
Hemoglobin: 10.2 g/dL — ABNORMAL LOW (ref 12.0–15.0)
Immature Granulocytes: 1 %
Lymphocytes Relative: 28 %
Lymphs Abs: 1.8 10*3/uL (ref 0.7–4.0)
MCH: 29.2 pg (ref 26.0–34.0)
MCHC: 30.4 g/dL (ref 30.0–36.0)
MCV: 96.3 fL (ref 80.0–100.0)
Monocytes Absolute: 0.5 10*3/uL (ref 0.1–1.0)
Monocytes Relative: 7 %
Neutro Abs: 3.9 10*3/uL (ref 1.7–7.7)
Neutrophils Relative %: 60 %
Platelets: 256 10*3/uL (ref 150–400)
RBC: 3.49 MIL/uL — ABNORMAL LOW (ref 3.87–5.11)
RDW: 15.9 % — ABNORMAL HIGH (ref 11.5–15.5)
WBC: 6.4 10*3/uL (ref 4.0–10.5)
nRBC: 0 % (ref 0.0–0.2)

## 2023-04-10 LAB — IRON AND TIBC
Iron: 46 ug/dL (ref 28–170)
Saturation Ratios: 12 % (ref 10.4–31.8)
TIBC: 400 ug/dL (ref 250–450)
UIBC: 354 ug/dL

## 2023-04-10 LAB — FERRITIN: Ferritin: 53 ng/mL (ref 11–307)

## 2023-04-16 DIAGNOSIS — K625 Hemorrhage of anus and rectum: Secondary | ICD-10-CM | POA: Diagnosis not present

## 2023-04-16 DIAGNOSIS — D500 Iron deficiency anemia secondary to blood loss (chronic): Secondary | ICD-10-CM | POA: Diagnosis not present

## 2023-04-16 DIAGNOSIS — K219 Gastro-esophageal reflux disease without esophagitis: Secondary | ICD-10-CM | POA: Diagnosis not present

## 2023-04-17 ENCOUNTER — Inpatient Hospital Stay: Payer: 59 | Admitting: Physician Assistant

## 2023-04-23 NOTE — Progress Notes (Unsigned)
[facility] [address], [place] [address]   CLINIC:  Medical Oncology/Hematology  PCP:  [name], FNP [address] [place] [address] [phone]   REASON FOR VISIT:  Follow-up for iron deficiency anemia   CURRENT THERAPY: Intermittent IV iron  INTERVAL HISTORY:   [name] 52 y.o. female returns for routine follow-up of her iron deficiency anemia.  She was last seen by Rojelio Brenner PA-C on 02/13/2023.     At today's visit, she reports feeling poorly due to back pain.  *** *** She received IV Feraheme x 3 in October/November 2024, and reports feeling transient increase in energy after IV iron. *** She had recurrent fatigue after a few weeks. *** She reports dyspnea on exertion. *** She had an episode of substernal pain last week that occurred while lying down, which was sharp in nature, and which was relieved with Tums and ginger ale.  She denies any other episodes of chest pain.  *** *** No pica, palpitations, lightheadedness, or syncope.    She has not noticed any recent rectal bleeding or melena since her colonoscopy in November 2023.  *** *** She has not had any further vaginal bleeding since her isolated episode in late 2022.  She has 50***% energy and 50***% appetite. She endorses that she is maintaining a stable weight.  ASSESSMENT & PLAN:  1.  Iron deficiency anemia with suspected occult GI bleed - ED on 07/18/2020 with Hgb 5.7, fatigue, lightheadedness.  Denied overt GI bleeding and was Hemoccult-negative, but does have a history of peptic ulcer disease.  She was transfused 2 units PRBCs on 07/18/2020, and followed up with gastroenterology.  - Colonoscopy (08/24/2020) showed diverticulosis, but was otherwise normal. - EGD (09/20/2020): Large hiatal hernia, Cameron lesions and antral erosions (suspicious for causing intermittent bleeding in the setting of ongoing NSAID use per GI note) - EGD and colonoscopy at [facility] on 03/30/2022, which showed multiple scattered pancolonic diverticula of moderate severity otherwise normal colonoscopy; EGD was normal  - She is on Protonix for peptic ulcer disease - Hematology work-up (07/29/2020) showed Hgb 8.5 (microcytic with MCV 78.2).  Ferritin was 7, with 32% iron saturation.  Reticulocytes 3.4 (reticulocyte index 1.61, consistent with hypoproliferation in the setting of iron deficiency).   - Most recent IV iron with Feraheme x3 in October/November 2024 - Per outside medical records, patient had mild postinfusion reaction with rash on bilateral arms after Venofer infusion on 09/29/2021.  Tolerated Feraheme well with PREMEDICATION. - Intermittent rectal bleeding.  No recent signs of melena.*** - Follows with [facility] GI Berna Bue PA-C & Dr. Briant Sites)  - Symptomatic with fatigue and dyspnea on exertion*** - Most recent labs (04/10/2023): Hgb 10.2/MCV 96.3.  Ferritin 53, iron saturation 12%, TIBC 400.  Labs from October 2023 showed CMP unremarkable, LDH normal; normal B12, MMA, folate, SPEP, haptoglobin. - PLAN: Recommend additional IV Feraheme x 2 - PREMEDICATE WITH STEROIDS due to possible reaction after most recent Venofer. - Continue GI follow-up.  - RTC in 2 months for repeat CBC and iron panel followed by office visit  PLAN SUMMARY: >> Feraheme x 2 >> Labs in 2 months = CBC/D, ferritin, iron/TIBC >> OFFICE visit in 2 months ***   REVIEW OF SYSTEMS:***  Review of Systems  Constitutional:  Positive for fatigue. Negative for appetite change, chills, diaphoresis, fever and unexpected weight change.  HENT:   Negative for lump/mass and nosebleeds.   Eyes:  Negative for eye problems.  Respiratory:  Positive for shortness of breath (with exertion). Negative for cough and hemoptysis.   Cardiovascular:  Positive for chest pain (relieved w/ Tums, none today). Negative for leg swelling and palpitations.  Gastrointestinal:  Negative for abdominal pain,  blood in stool, constipation, diarrhea, nausea and vomiting.  Genitourinary:  Negative for hematuria.   Musculoskeletal:  Positive for back pain.  Skin: Negative.   Neurological:  Negative for dizziness, headaches, light-headedness and numbness.  Hematological:  Does not bruise/bleed easily.  Psychiatric/Behavioral:  Positive for depression and sleep disturbance. The patient is nervous/anxious.      PHYSICAL EXAM:  ECOG PERFORMANCE STATUS: 1 - Symptomatic but completely ambulatory *** There were no vitals filed for this visit.  There were no vitals filed for this visit.  Physical Exam Constitutional:      Appearance: Normal appearance. She is obese.  Cardiovascular:     Heart sounds: Normal heart sounds.  Pulmonary:     Breath sounds: Normal breath sounds.  Neurological:     General: No focal deficit present.     Mental Status: Mental status is at baseline.  Psychiatric:        Behavior: Behavior normal. Behavior is cooperative.     PAST MEDICAL/SURGICAL HISTORY:  Past Medical History:  Diagnosis Date   Anemia    Asthma    childhood   Carotidynia    COPD (chronic obstructive pulmonary disease) (HCC)    DDD (degenerative disc disease), cervical    GERD (gastroesophageal reflux disease)    Hypertension    Knee pain    left meniscus tear s/p repair 11/2014   Seizures (HCC)    last seizure was over 10 years ago, unknown etiology   Stuttering    patient reports this started after her knee surgery in 11/2014.    Thyroid nodule    Past Surgical History:  Procedure Laterality Date   ANTERIOR CERVICAL DECOMP/DISCECTOMY FUSION N/A 03/20/2019   Procedure: Anterior Cervical Decompression Fusion - Cervical Four-Cervical Five - Cervical Five-Cervical Six;  Surgeon: Tia Alert, MD;  Location: [facility] OR;  Service: Neurosurgery;  Laterality: N/A;  Anterior Cervical Decompression Fusion - Cervical Four-Cervical Five - Cervical Five-Cervical Six   BIOPSY  09/20/2020   Procedure:  BIOPSY;  Surgeon: Corbin Ade, MD;  Location: [facility];  Service: Endoscopy;;   COLONOSCOPY N/A 08/23/2016   Procedure: COLONOSCOPY;  Surgeon: Corbin Ade, MD;  Location: [facility];  Service: Endoscopy;  Laterality: N/A;  1:00pm   COLONOSCOPY N/A 08/24/2020   Diverticulosis in the entire examined colon. Redundant colon. Exam otherwise normal   ESOPHAGOGASTRODUODENOSCOPY N/A 05/03/2016   Procedure: ESOPHAGOGASTRODUODENOSCOPY (EGD);  Surgeon: Corbin Ade, MD;  Location: [facility];  Service: Endoscopy;  Laterality: N/A;   ESOPHAGOGASTRODUODENOSCOPY N/A 08/23/2016   Procedure: ESOPHAGOGASTRODUODENOSCOPY (EGD);  Surgeon: Corbin Ade, MD;  Location: [facility];  Service: Endoscopy;  Laterality: N/A;   ESOPHAGOGASTRODUODENOSCOPY (EGD) WITH PROPOFOL N/A 09/20/2020   normal esophagus. Large hiatal hernia. Multiple gastric erosions (Cameron lesions). Normal duodenal bulb and second portion of the duodenum. Suspicion Sheria Lang lesions causing intermittent bleeding   KNEE ARTHROSCOPY Right    KNEE ARTHROSCOPY WITH MEDIAL MENISECTOMY Left 12/09/2014   Procedure: KNEE ARTHROSCOPY WITH PARTIAL MEDIAL AND LATERAL MENISECTOMY;  Surgeon: Darreld Mclean, MD;  Location: [facility] ORS;  Service: Orthopedics;  Laterality: Left;   TUBAL LIGATION     WISDOM TOOTH EXTRACTION      SOCIAL HISTORY:  Social  History   Socioeconomic History   Marital status: Single    Spouse name: Not on file   Number of children: 1   Years of education: 69   Highest education level: Not on file  Occupational History   Occupation: Umemployed    Comment: Disability  Tobacco Use   Smoking status: Never   Smokeless tobacco: Never  Vaping Use   Vaping status: Never Used  Substance and Sexual Activity   Alcohol use: No   Drug use: No   Sexual activity: Not Currently    Birth control/protection: Surgical  Other Topics Concern   Not on file  Social History Narrative   Lives at home alone.   Disabled - speech  difficulties, cannot drive   Right-handed.   Occasional caffeine use.   Her daughter lives about 30-45 minutes away   She has lots of family nearby - uncle, mother, sister, brother   Social Determinants of Health   Financial Resource Strain: Low Risk  (12/27/2022)   Overall Financial Resource Strain (CARDIA)    Difficulty of Paying Living Expenses: Not hard at all  Food Insecurity: No Food Insecurity (12/27/2022)   Hunger Vital Sign    Worried About Running Out of Food in the Last Year: Never true    Ran Out of Food in the Last Year: Never true  Transportation Needs: No Transportation Needs (12/27/2022)   PRAPARE - Administrator, Civil Service (Medical): No    Lack of Transportation (Non-Medical): No  Physical Activity: Inactive (12/27/2022)   Exercise Vital Sign    Days of Exercise per Week: 0 days    Minutes of Exercise per Session: 0 min  Stress: No Stress Concern Present (12/27/2022)   [employer] of Occupational Health - Occupational Stress Questionnaire    Feeling of Stress : Not at all  Social Connections: Moderately Isolated (12/27/2022)   Social Connection and Isolation Panel [NHANES]    Frequency of Communication with Friends and Family: More than three times a week    Frequency of Social Gatherings with Friends and Family: More than three times a week    Attends Religious Services: More than 4 times per year    Active Member of [employer] or Organizations: No    Attends Banker Meetings: Never    Marital Status: Never married  Intimate Partner Violence: Not At Risk (12/27/2022)   Humiliation, Afraid, Rape, and Kick questionnaire    Fear of Current or Ex-Partner: No    Emotionally Abused: No    Physically Abused: No    Sexually Abused: No    FAMILY HISTORY:  Family History  Problem Relation Age of Onset   Hypertension Mother    Lung cancer Father    Hypertension Brother    Colon cancer Neg Hx    Gastric cancer Neg Hx    Esophageal cancer  Neg Hx     CURRENT MEDICATIONS:  Outpatient Encounter Medications as of 04/24/2023  Medication Sig   amLODipine (NORVASC) 10 MG tablet Take 1 tablet (10 mg total) by mouth daily.   baclofen (LIORESAL) 10 MG tablet Take 1 tablet (10 mg total) by mouth 3 (three) times daily.   buPROPion (WELLBUTRIN XL) 150 MG 24 hr tablet Take 1 tablet (150 mg total) by mouth daily.   busPIRone (BUSPAR) 5 MG tablet TAKE ONE TABLET BY MOUTH UP TO THREE TIMES DAILY.   celecoxib (CELEBREX) 200 MG capsule Take 1 capsule (200 mg total) by mouth 2 (two)  times daily.   escitalopram (LEXAPRO) 20 MG tablet Take 1 tablet (20 mg total) by mouth daily.   pantoprazole (PROTONIX) 40 MG tablet TAKE ONE TABLET EVERY DAY BEFORE BREAKFAST   No facility-administered encounter medications on file as of 04/24/2023.    ALLERGIES:  Allergies  Allergen Reactions   Venofer [Iron Sucrose] Rash    LABORATORY DATA:  I have reviewed the labs as listed.  CBC    Component Value Date/Time   WBC 6.4 04/10/2023 1051   RBC 3.49 (L) 04/10/2023 1051   HGB 10.2 (L) 04/10/2023 1051   HGB 9.6 (L) 02/19/2023 1500   HCT 33.6 (L) 04/10/2023 1051   HCT 30.3 (L) 02/19/2023 1500   PLT 256 04/10/2023 1051   PLT 282 02/19/2023 1500   MCV 96.3 04/10/2023 1051   MCV 88 02/19/2023 1500   MCH 29.2 04/10/2023 1051   MCHC 30.4 04/10/2023 1051   RDW 15.9 (H) 04/10/2023 1051   RDW 14.7 02/19/2023 1500   LYMPHSABS 1.8 04/10/2023 1051   LYMPHSABS 1.7 02/19/2023 1500   MONOABS 0.5 04/10/2023 1051   EOSABS 0.2 04/10/2023 1051   EOSABS 0.1 02/19/2023 1500   BASOSABS 0.0 04/10/2023 1051   BASOSABS 0.0 02/19/2023 1500      Latest Ref Rng & Units 02/19/2023    3:00 PM 11/30/2022   10:15 AM 09/06/2022   10:55 AM  CMP  Glucose 70 - 99 mg/dL 85  629  84   BUN 6 - 24 mg/dL 12  15  21    Creatinine 0.57 - 1.00 mg/dL 5.28  4.13  2.44   Sodium 134 - 144 mmol/L 143  138  140   Potassium 3.5 - 5.2 mmol/L 4.2  3.6  4.0   Chloride 96 - 106 mmol/L 105   105  102   CO2 20 - 29 mmol/L 23  25  22    Calcium 8.7 - 10.2 mg/dL 9.9  9.3  9.8   Total Protein 6.0 - 8.5 g/dL 7.1  7.8  6.9   Total Bilirubin 0.0 - 1.2 mg/dL 0.2  0.7  0.2   Alkaline Phos 44 - 121 IU/L 84  77  102   AST 0 - 40 IU/L 26  26  10    ALT 0 - 32 IU/L 24  30  15      DIAGNOSTIC IMAGING:  I have independently reviewed the relevant imaging and discussed with the patient.   WRAP UP:  All questions were answered. The patient knows to call the clinic with any problems, questions or concerns.  Medical decision making: Low  Time spent on visit: I spent 15 minutes counseling the patient face to face. The total time spent in the appointment was 22 minutes and more than 50% was on counseling.  Carnella Guadalajara, PA-C  ***

## 2023-04-24 ENCOUNTER — Other Ambulatory Visit: Payer: Self-pay | Admitting: Family

## 2023-04-24 ENCOUNTER — Inpatient Hospital Stay: Payer: 59 | Attending: Hematology | Admitting: Physician Assistant

## 2023-04-24 VITALS — BP 119/81 | HR 93 | Temp 97.2°F | Resp 20 | Wt 197.8 lb

## 2023-04-24 DIAGNOSIS — D500 Iron deficiency anemia secondary to blood loss (chronic): Secondary | ICD-10-CM

## 2023-04-24 DIAGNOSIS — D509 Iron deficiency anemia, unspecified: Secondary | ICD-10-CM | POA: Diagnosis not present

## 2023-04-24 DIAGNOSIS — G8929 Other chronic pain: Secondary | ICD-10-CM

## 2023-04-24 NOTE — Patient Instructions (Signed)
[facility] at [facility] Discharge Instructions  You were seen today by Rojelio Brenner PA-C for your iron deficiency anemia.  Your blood levels and iron levels are still low.    I recommend that we schedule you for an additional IV iron x 2 doses.  We will recheck your blood and iron levels in 2 months along with an office visit.  Continue to follow-up with your GI doctor regarding possible bleeding in your stomach and intestines.  FOLLOW-UP APPOINTMENT: Same-day labs and office visit in 2 months   - - - - - - - - - - - - - - - - - -    Thank you for choosing [facility] at [facility] to provide your oncology and hematology care.  To afford each patient quality time with our provider, please arrive at least 15 minutes before your scheduled appointment time.   If you have a lab appointment with the Cancer Center please come in thru the Main Entrance and check in at the main information desk.  You need to re-schedule your appointment should you arrive 10 or more minutes late.  We strive to give you quality time with our providers, and arriving late affects you and other patients whose appointments are after yours.  Also, if you no show three or more times for appointments you may be dismissed from the clinic at the providers discretion.     Again, thank you for choosing [facility]  Our hope is that these requests will decrease the amount of time that you wait before being seen by our physicians.       _____________________________________________________________  Should you have questions after your visit to [facility], please contact our office at [phone] and follow the prompts.  Our office hours are 8:00 a.m. and 4:30 p.m. Monday - Friday.  Please note that voicemails left after 4:00 p.m. may not be returned until the following business day.  We are closed weekends and major holidays.  You do have access  to a nurse 24-7, just call the main number to the clinic [phone] and do not press any options, hold on the line and a nurse will answer the phone.    For prescription refill requests, have your pharmacy contact our office and allow 72 hours.    Due to Covid, you will need to wear a mask upon entering the hospital. If you do not have a mask, a mask will be given to you at the Main Entrance upon arrival. For doctor visits, patients may have 1 support person age 21 or older with them. For treatment visits, patients can not have anyone with them due to social distancing guidelines and our immunocompromised population.

## 2023-05-02 ENCOUNTER — Ambulatory Visit: Payer: 59 | Admitting: Orthopaedic Surgery

## 2023-05-02 ENCOUNTER — Encounter: Payer: Self-pay | Admitting: Orthopaedic Surgery

## 2023-05-02 VITALS — Ht 67.0 in | Wt 197.0 lb

## 2023-05-02 DIAGNOSIS — M5126 Other intervertebral disc displacement, lumbar region: Secondary | ICD-10-CM

## 2023-05-02 NOTE — Progress Notes (Signed)
Office Visit Note   Patient: Brittany Durham           Date of Birth: Jul 30, 1970           MRN: [id number] Visit Date: 05/02/2023              Requested by: [name], FNP [address] [place],  [place] [address] PCP: [name], FNP   Assessment & Plan: Visit Diagnoses:  1. Protrusion of lumbar intervertebral disc     Plan: Patient had 2 epidurals and the protrusion was extraforaminal with some displacement of the nerve root at L4-5 on the left.  Will check her back in a month.  We discussed possible referral to Dr. Christell Constant for consideration of surgical intervention if her symptoms get worse.  She had gotten some temporary relief with the epidural.  Follow-Up Instructions: Return in about 1 month (around 06/02/2023).   Orders:  No orders of the defined types were placed in this encounter.  No orders of the defined types were placed in this encounter.     Procedures: No procedures performed   Clinical Data: No additional findings.   Subjective: Chief Complaint  Patient presents with   Lower Back - Pain, Follow-up    HPI 52 year old female returns she had her second epidural injection on 04/04/2023.  She states she was better but only lasted for about a week with good pain relief pain is gradually getting worse.  She is using Tylenol and IcyHot patch.  Onset of her symptoms was in April.  Patient's MRI scan 11/25/2022 showed moderate size extraforaminal disc extrusion at L4-5 with some left L4 nerve root encroachment.  She had only mild lateral recess narrowing and no central stenosis.  Review of Systems history of depression cervical fusion, knee arthroscopy.  Posttraumatic speech impediment.   Objective: Vital Signs: Ht 5\' 7"  (1.702 m)   Wt 197 lb (89.4 kg)   LMP 09/16/2017   BMI 30.85 kg/m   Physical Exam Constitutional:      Appearance: She is well-developed.  HENT:     Head: Normocephalic.     Right Ear: External ear normal.     Left Ear:  External ear normal. There is no impacted cerumen.  Eyes:     Pupils: Pupils are equal, round, and reactive to light.  Neck:     Thyroid: No thyromegaly.     Trachea: No tracheal deviation.  Cardiovascular:     Rate and Rhythm: Normal rate.  Pulmonary:     Effort: Pulmonary effort is normal.  Abdominal:     Palpations: Abdomen is soft.  Musculoskeletal:     Cervical back: No rigidity.  Skin:    General: Skin is warm and dry.  Neurological:     Mental Status: She is alert and oriented to person, place, and time.  Psychiatric:        Behavior: Behavior normal.     Ortho Exam patient can ambulate without limping.  Anterior tib gastrocsoleus is strong.  She has sciatic notch tenderness on the left negative on the right.  Straight leg raising positive at 90 degrees positive popliteal compression test.  Negative logroll hips.  Reflexes are intact.  Specialty Comments:  CLINICAL DATA:  Low back pain with claudication for 3 months. No known injury or prior relevant surgery.   EXAM: MRI LUMBAR SPINE WITHOUT CONTRAST   TECHNIQUE: Multiplanar, multisequence MR imaging of the lumbar spine was performed. No intravenous contrast was administered.  COMPARISON:  Lumbar spine radiographs 10/27/2020. Abdominopelvic CT 07/26/2022.   FINDINGS: Segmentation: Conventional anatomy assumed, with the last open disc space designated L5-S1.Concordant with prior imaging.   Alignment:  Minimal convex left scoliosis.   Vertebrae: There is diffusely decreased T1 and T2 marrow signal without focally suspicious lesion, acute fracture or pars defect. The visualized sacroiliac joints appear unremarkable.   Conus medullaris: Extends to the L1 level and appears normal.   Paraspinal and other soft tissues: No significant paraspinal findings. Uterine fibroids noted.   Disc levels:   Sagittal images demonstrate mild disc bulging at T11-12 and T12-L1 without resulting spinal stenosis or nerve root  encroachment.   L1-2: Disc desiccation with mild bulging and bilateral facet hypertrophy. No significant spinal stenosis or nerve root encroachment.   L2-3: Preserved disc height and hydration. Mild disc bulging eccentric to the left and mild bilateral facet hypertrophy. No spinal stenosis or nerve root encroachment.   L3-4: Preserved disc height with mild disc bulging, facet and ligamentous hypertrophy. No significant central spinal stenosis. Mild foraminal narrowing bilaterally without definite L3 nerve root encroachment.   L4-5: Preserved disc height with mild disc bulging and a moderate-sized extraforaminal disc extrusion on the left. Resulting left foraminal narrowing and L4 nerve root encroachment. Mild facet and ligamentous hypertrophy. The left lateral recess is mildly narrowed. The spinal canal and right foramen are patent.   L5-S1: Preserved disc height and hydration. Moderate bilateral facet hypertrophy. No spinal stenosis or nerve root encroachment.   IMPRESSION: 1. Moderate-sized extraforaminal disc extrusion on the left at L4-5 with resulting left L4 nerve root encroachment. Correlate with specific radicular symptoms. 2. No other significant spinal stenosis or nerve root encroachment. 3. Nonspecific diffusely decreased T1 and T2 marrow signal without focally suspicious lesion. This may be related to anemia, smoking or obesity.     Electronically Signed   By: Carey Bullocks M.D.   On: 11/25/2022 13:21  Imaging: No results found.   PMFS History: Patient Active Problem List   Diagnosis Date Noted   Protrusion of lumbar intervertebral disc 11/29/2022   Biliary dyskinesia 02/01/2021   Right flank pain 10/27/2020   Iron deficiency anemia due to chronic blood loss 09/15/2020   Symptomatic anemia 07/20/2020   Depression, major, single episode, moderate (HCC) 07/15/2020   Vertigo 01/26/2020   GAD (generalized anxiety disorder) 07/28/2019   Radiculopathy,  cervical region 07/16/2019   S/P cervical spinal fusion 03/20/2019   Osteoarthritis 11/18/2017   Insomnia 11/18/2017   Synovitis, villonodular, knee, right 10/28/2017   Tear of lateral meniscus of right knee, current 10/28/2017   Chondromalacia patellae, 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   Language difficulty 09/25/2016   Nausea with vomiting 07/18/2016   PUD (peptic ulcer disease) 07/18/2016   Slurred speech    GI bleed 05/02/2016   Sinus tachycardia 05/02/2016   Acute blood loss anemia 05/02/2016   Speech disorder 03/31/2015   Thyroid nodule 12/14/2014   Left knee pain 12/14/2014   Vitamin D deficiency 06/30/2014   Gastroesophageal reflux disease without esophagitis 06/29/2014   SYNCOPE 03/02/2010   HYPERTENSION, BENIGN 03/01/2010   Past Medical History:  Diagnosis Date   Anemia    Asthma    childhood   Carotidynia    COPD (chronic obstructive pulmonary disease) (HCC)    DDD (degenerative disc disease), cervical    GERD (gastroesophageal reflux disease)    Hypertension    Knee pain    left meniscus tear s/p repair 11/2014   Seizures (HCC)  last seizure was over 10 years ago, unknown etiology   Stuttering    patient reports this started after her knee surgery in 11/2014.    Thyroid nodule     Family History  Problem Relation Age of Onset   Hypertension Mother    Lung cancer Father    Hypertension Brother    Colon cancer Neg Hx    Gastric cancer Neg Hx    Esophageal cancer Neg Hx     Past Surgical History:  Procedure Laterality Date   ANTERIOR CERVICAL DECOMP/DISCECTOMY FUSION N/A 03/20/2019   Procedure: Anterior Cervical Decompression Fusion - Cervical Four-Cervical Five - Cervical Five-Cervical Six;  Surgeon: Tia Alert, MD;  Location: [facility] OR;  Service: Neurosurgery;  Laterality: N/A;  Anterior Cervical Decompression Fusion - Cervical Four-Cervical Five - Cervical Five-Cervical Six   BIOPSY  09/20/2020   Procedure: BIOPSY;  Surgeon: Corbin Ade, MD;  Location: [facility]  ENDO SUITE;  Service: Endoscopy;;   COLONOSCOPY N/A 08/23/2016   Procedure: COLONOSCOPY;  Surgeon: Corbin Ade, MD;  Location: [facility] SU[facility];  Service: Endoscopy;  Laterality: N/A;  1:00pm   COLONOSCOPY N/A 08/24/2020   Diverticulosis in the entire examined colon. Redundant colon. Exam otherwise normal   ESOPHAGOGASTRODUODENOSCOPY N/A 05/03/2016   Procedure: ESOPHAGOGASTRODUODENOSCOPY (EGD);  Surgeon: Corbin Ade, MD;  Location: [facility];  Service: Endoscopy;  Laterality: N/A;   ESOPHAGOGASTRODUODENOSCOPY N/A 08/23/2016   Procedure: ESOPHAGOGASTRODUODENOSCOPY (EGD);  Surgeon: Corbin Ade, MD;  Location: [facility];  Service: Endoscopy;  Laterality: N/A;   ESOPHAGOGASTRODUODENOSCOPY (EGD) WITH PROPOFOL N/A 09/20/2020   normal esophagus. Large hiatal hernia. Multiple gastric erosions (Cameron lesions). Normal duodenal bulb and second portion of the duodenum. Suspicion Sheria Lang lesions causing intermittent bleeding   KNEE ARTHROSCOPY Right    KNEE ARTHROSCOPY WITH MEDIAL MENISECTOMY Left 12/09/2014   Procedure: KNEE ARTHROSCOPY WITH PARTIAL MEDIAL AND LATERAL MENISECTOMY;  Surgeon: Darreld Mclean, MD;  Location: [facility] ORS;  Service: Orthopedics;  Laterality: Left;   TUBAL LIGATION     WISDOM TOOTH EXTRACTION     Social History   Occupational History   Occupation: Umemployed    Comment: Disability  Tobacco Use   Smoking status: Never   Smokeless tobacco: Never  Vaping Use   Vaping status: Never Used  Substance and Sexual Activity   Alcohol use: No   Drug use: No   Sexual activity: Not Currently    Birth control/protection: Surgical

## 2023-05-03 ENCOUNTER — Inpatient Hospital Stay: Payer: 59

## 2023-05-03 VITALS — BP 121/79 | HR 99 | Temp 98.0°F | Resp 20

## 2023-05-03 DIAGNOSIS — D500 Iron deficiency anemia secondary to blood loss (chronic): Secondary | ICD-10-CM

## 2023-05-03 DIAGNOSIS — D509 Iron deficiency anemia, unspecified: Secondary | ICD-10-CM | POA: Diagnosis not present

## 2023-05-03 MED ORDER — FAMOTIDINE IN NACL 20-0.9 MG/50ML-% IV SOLN
20.0000 mg | Freq: Once | INTRAVENOUS | Status: AC
  Administered 2023-05-03: 20 mg via INTRAVENOUS
  Filled 2023-05-03: qty 50

## 2023-05-03 MED ORDER — SODIUM CHLORIDE 0.9 % IV SOLN
510.0000 mg | Freq: Once | INTRAVENOUS | Status: AC
  Administered 2023-05-03: 510 mg via INTRAVENOUS
  Filled 2023-05-03: qty 510

## 2023-05-03 MED ORDER — SODIUM CHLORIDE 0.9 % IV SOLN
INTRAVENOUS | Status: DC
Start: 2023-05-03 — End: 2023-05-03

## 2023-05-03 MED ORDER — ACETAMINOPHEN 325 MG PO TABS
650.0000 mg | ORAL_TABLET | Freq: Once | ORAL | Status: AC
  Administered 2023-05-03: 650 mg via ORAL
  Filled 2023-05-03: qty 2

## 2023-05-03 MED ORDER — CETIRIZINE HCL 10 MG/ML IV SOLN
10.0000 mg | Freq: Once | INTRAVENOUS | Status: AC
  Administered 2023-05-03: 10 mg via INTRAVENOUS
  Filled 2023-05-03: qty 1

## 2023-05-03 MED ORDER — METHYLPREDNISOLONE SODIUM SUCC 125 MG IJ SOLR
125.0000 mg | Freq: Once | INTRAMUSCULAR | Status: AC
  Administered 2023-05-03: 125 mg via INTRAVENOUS
  Filled 2023-05-03: qty 2

## 2023-05-03 NOTE — Progress Notes (Signed)
Patient presents today for Feraheme, HR 118 patient okay to proceed with treatment per Rojelio Brenner, PA. Patient tolerated iron infusion with no complaints voiced. Peripheral IV site clean and dry with good blood return noted before and after infusion. Band aid applied. VSS with discharge and left in satisfactory condition with no s/s of distress noted.

## 2023-05-03 NOTE — Patient Instructions (Signed)
 [facility] [place] [facility]  Discharge Instructions: Thank you for choosing [facility] to provide your oncology and hematology care.  If you have a lab appointment with the Cancer Center - please note that after April 8th, 2024, all labs will be drawn in the cancer center.  You do not have to check in or register with the main entrance as you have in the past but will complete your check-in in the cancer center.  Wear comfortable clothing and clothing appropriate for easy access to any Portacath or PICC line.   We strive to give you quality time with your provider. You may need to reschedule your appointment if you arrive late (15 or more minutes).  Arriving late affects you and other patients whose appointments are after yours.  Also, if you miss three or more appointments without notifying the office, you may be dismissed from the clinic at the provider's discretion.      For prescription refill requests, have your pharmacy contact our office and allow 72 hours for refills to be completed.    Today you received the following Feraheme, return as scheduled.   To help prevent nausea and vomiting after your treatment, we encourage you to take your nausea medication as directed.  BELOW ARE SYMPTOMS THAT SHOULD BE REPORTED IMMEDIATELY: *FEVER GREATER THAN 100.4 F (38 C) OR HIGHER *CHILLS OR SWEATING *NAUSEA AND VOMITING THAT IS NOT CONTROLLED WITH YOUR NAUSEA MEDICATION *UNUSUAL SHORTNESS OF BREATH *UNUSUAL BRUISING OR BLEEDING *URINARY PROBLEMS (pain or burning when urinating, or frequent urination) *BOWEL PROBLEMS (unusual diarrhea, constipation, pain near the anus) TENDERNESS IN MOUTH AND THROAT WITH OR WITHOUT PRESENCE OF ULCERS (sore throat, sores in mouth, or a toothache) UNUSUAL RASH, SWELLING OR PAIN  UNUSUAL VAGINAL DISCHARGE OR ITCHING   Items with * indicate a potential emergency and should be followed up as soon as possible or  go to the Emergency Department if any problems should occur.  Please show the CHEMOTHERAPY ALERT CARD or IMMUNOTHERAPY ALERT CARD at check-in to the Emergency Department and triage nurse.  Should you have questions after your visit or need to cancel or reschedule your appointment, please contact [facility] [place] - A DEPT OF Eligha Bridegroom [facility] [phone]  and follow the prompts.  Office hours are 8:00 a.m. to 4:30 p.m. Monday - Friday. Please note that voicemails left after 4:00 p.m. may not be returned until the following business day.  We are closed weekends and major holidays. You have access to a nurse at all times for urgent questions. Please call the main number to the clinic [phone] and follow the prompts.  For any non-urgent questions, you may also contact your provider using MyChart. We now offer e-Visits for anyone 71 and older to request care online for non-urgent symptoms. For details visit [email or web address].   Also download the MyChart app! Go to the app store, search "MyChart", open the app, select [place], and log in with your MyChart username and password.

## 2023-05-10 ENCOUNTER — Inpatient Hospital Stay: Payer: 59

## 2023-05-17 ENCOUNTER — Inpatient Hospital Stay: Payer: 59

## 2023-05-24 ENCOUNTER — Inpatient Hospital Stay: Payer: 59

## 2023-05-31 ENCOUNTER — Encounter: Payer: Self-pay | Admitting: Hematology

## 2023-05-31 ENCOUNTER — Inpatient Hospital Stay: Payer: 59 | Attending: Hematology

## 2023-05-31 VITALS — BP 100/89 | HR 96 | Temp 96.7°F | Resp 18

## 2023-05-31 DIAGNOSIS — D509 Iron deficiency anemia, unspecified: Secondary | ICD-10-CM | POA: Diagnosis not present

## 2023-05-31 DIAGNOSIS — D500 Iron deficiency anemia secondary to blood loss (chronic): Secondary | ICD-10-CM

## 2023-05-31 MED ORDER — METHYLPREDNISOLONE SODIUM SUCC 125 MG IJ SOLR
125.0000 mg | Freq: Once | INTRAMUSCULAR | Status: AC
  Administered 2023-05-31: 125 mg via INTRAVENOUS
  Filled 2023-05-31: qty 2

## 2023-05-31 MED ORDER — SODIUM CHLORIDE 0.9 % IV SOLN
510.0000 mg | Freq: Once | INTRAVENOUS | Status: AC
  Administered 2023-05-31: 510 mg via INTRAVENOUS
  Filled 2023-05-31: qty 510

## 2023-05-31 MED ORDER — SODIUM CHLORIDE 0.9 % IV SOLN: Freq: Once | INTRAVENOUS | Status: AC

## 2023-05-31 MED ORDER — CETIRIZINE HCL 10 MG/ML IV SOLN
10.0000 mg | Freq: Once | INTRAVENOUS | Status: AC
  Administered 2023-05-31: 10 mg via INTRAVENOUS
  Filled 2023-05-31: qty 1

## 2023-05-31 MED ORDER — FAMOTIDINE IN NACL 20-0.9 MG/50ML-% IV SOLN
20.0000 mg | Freq: Once | INTRAVENOUS | Status: AC
  Administered 2023-05-31: 20 mg via INTRAVENOUS
  Filled 2023-05-31: qty 50

## 2023-05-31 MED ORDER — ACETAMINOPHEN 325 MG PO TABS
650.0000 mg | ORAL_TABLET | Freq: Once | ORAL | Status: AC
Start: 2023-05-31 — End: 2023-05-31
  Administered 2023-05-31: 650 mg via ORAL
  Filled 2023-05-31: qty 2

## 2023-05-31 NOTE — Patient Instructions (Signed)
 [facility] [place] [facility]  Discharge Instructions: Thank you for choosing [facility] to provide your oncology and hematology care.  If you have a lab appointment with the Cancer Center - please note that after April 8th, 2024, all labs will be drawn in the cancer center.  You do not have to check in or register with the main entrance as you have in the past but will complete your check-in in the cancer center.  Wear comfortable clothing and clothing appropriate for easy access to any Portacath or PICC line.   We strive to give you quality time with your provider. You may need to reschedule your appointment if you arrive late (15 or more minutes).  Arriving late affects you and other patients whose appointments are after yours.  Also, if you miss three or more appointments without notifying the office, you may be dismissed from the clinic at the provider's discretion.      For prescription refill requests, have your pharmacy contact our office and allow 72 hours for refills to be completed.    Today you received the following chemotherapy and/or immunotherapy agents Feraheme      To help prevent nausea and vomiting after your treatment, we encourage you to take your nausea medication as directed.  BELOW ARE SYMPTOMS THAT SHOULD BE REPORTED IMMEDIATELY: *FEVER GREATER THAN 100.4 F (38 C) OR HIGHER *CHILLS OR SWEATING *NAUSEA AND VOMITING THAT IS NOT CONTROLLED WITH YOUR NAUSEA MEDICATION *UNUSUAL SHORTNESS OF BREATH *UNUSUAL BRUISING OR BLEEDING *URINARY PROBLEMS (pain or burning when urinating, or frequent urination) *BOWEL PROBLEMS (unusual diarrhea, constipation, pain near the anus) TENDERNESS IN MOUTH AND THROAT WITH OR WITHOUT PRESENCE OF ULCERS (sore throat, sores in mouth, or a toothache) UNUSUAL RASH, SWELLING OR PAIN  UNUSUAL VAGINAL DISCHARGE OR ITCHING   Items with * indicate a potential emergency and should be followed up  as soon as possible or go to the Emergency Department if any problems should occur.  Please show the CHEMOTHERAPY ALERT CARD or IMMUNOTHERAPY ALERT CARD at check-in to the Emergency Department and triage nurse.  Should you have questions after your visit or need to cancel or reschedule your appointment, please contact [facility] [place] - A DEPT OF [facility] Bridegroom [facility] [phone]  and follow the prompts.  Office hours are 8:00 a.m. to 4:30 p.m. Monday - Friday. Please note that voicemails left after 4:00 p.m. may not be returned until the following business day.  We are closed weekends and major holidays. You have access to a nurse at all times for urgent questions. Please call the main number to the clinic [phone] and follow the prompts.  For any non-urgent questions, you may also contact your provider using MyChart. We now offer e-Visits for anyone 69 and older to request care online for non-urgent symptoms. For details visit [email or web address].   Also download the MyChart app! Go to the app store, search "MyChart", open the app, select [place], and log in with your MyChart username and password.

## 2023-05-31 NOTE — Progress Notes (Signed)
Patient presents today for Feraheme infusion per providers order.  Vital signs WNL.  Patient has no new complaints at this time.  Peripheral IV started and blood return noted pre and post infusion.    Stable during infusion without adverse affects.  Vital signs stable.  No complaints at this time.  Discharge from clinic ambulatory in stable condition.  Alert and oriented X 3.  Follow up with [facility] as scheduled.  

## 2023-06-06 ENCOUNTER — Encounter: Payer: Self-pay | Admitting: Orthopaedic Surgery

## 2023-06-06 ENCOUNTER — Ambulatory Visit: Payer: 59 | Admitting: Orthopaedic Surgery

## 2023-06-06 VITALS — Ht 67.0 in | Wt 202.0 lb

## 2023-06-06 DIAGNOSIS — M5416 Radiculopathy, lumbar region: Secondary | ICD-10-CM | POA: Diagnosis not present

## 2023-06-06 DIAGNOSIS — M5126 Other intervertebral disc displacement, lumbar region: Secondary | ICD-10-CM

## 2023-06-06 MED ORDER — TRAMADOL HCL 50 MG PO TABS: 50.0000 mg | ORAL_TABLET | Freq: Three times a day (TID) | ORAL | 1 refills | Status: DC | PRN

## 2023-06-06 NOTE — Progress Notes (Signed)
Office Visit Note   Patient: Brittany Durham           Date of Birth: Jun 11, 1970           MRN: [id number] Visit Date: 06/06/2023              Requested by: [name], FNP [address] [place],  [place] [address] PCP: [name], FNP   Assessment & Plan: Visit Diagnoses:  1. Protrusion of lumbar intervertebral disc   2. Lumbar radiculopathy     Plan: Patient with extraforaminal disc protrusion L4-5 on the left with persistent radicular symptoms positive nerve root tension signs.  Patient states she cannot handle the pain any longer and she needs updated MRI scan and referral to Dr. Christell Constant for consideration of surgical treatment.  She has question about the technique.  We reviewed previous MRI scan images and report.  Follow-Up Instructions: No follow-ups on file.   Orders:  Orders Placed This Encounter  Procedures   MR Lumbar Spine w/o contrast   Meds ordered this encounter  Medications   traMADol (ULTRAM) 50 MG tablet    Sig: Take 1 tablet (50 mg total) by mouth every 8 (eight) hours as needed.    Dispense:  30 tablet    Refill:  1      Procedures: No procedures performed   Clinical Data: No additional findings.   Subjective: Chief Complaint  Patient presents with   Lower Back - Pain, Follow-up    HPI 53 year old female with persistent symptoms with left L4-5 extraforaminal disc protrusion with radiculopathy.  She has had 2 epidural she states she cannot take the pain any longer and would like to see Dr. Christell Constant for surgery.  Last scan was more than 6 months ago and she needs an updated scan for evaluation of the extraforaminal disc herniation.  Review of Systems past history of cervical disc degeneration with fusion which is doing well.  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  Brain MRI has been normal.   Objective: Vital Signs: Ht 5\' 7"  (1.702 m)   Wt 202 lb (91.6 kg)   LMP 09/16/2017   BMI 31.64 kg/m   Physical  Exam Constitutional:      Appearance: She is well-developed.  HENT:     Head: Normocephalic.     Right Ear: External ear normal.     Left Ear: External ear normal. There is no impacted cerumen.  Eyes:     Pupils: Pupils are equal, round, and reactive to light.  Neck:     Thyroid: No thyromegaly.     Trachea: No tracheal deviation.  Cardiovascular:     Rate and Rhythm: Normal rate.  Pulmonary:     Effort: Pulmonary effort is normal.  Abdominal:     Palpations: Abdomen is soft.  Musculoskeletal:     Cervical back: No rigidity.  Skin:    General: Skin is warm and dry.  Neurological:     Mental Status: She is alert and oriented to person, place, and time.  Psychiatric:        Behavior: Behavior normal.     Ortho Exam patient can ambulate without limping.  Anterior tib gastrocsoleus is strong.  She has sciatic notch tenderness on the left negative on the right.  Straight leg raising positive at 90 degrees positive popliteal compression test.  Negative logroll hips.  Reflexes are intact.    Specialty Comments:  CLINICAL DATA:  Low back pain with claudication  for 3 months. No known injury or prior relevant surgery.   EXAM: MRI LUMBAR SPINE WITHOUT CONTRAST   TECHNIQUE: Multiplanar, multisequence MR imaging of the lumbar spine was performed. No intravenous contrast was administered.   COMPARISON:  Lumbar spine radiographs 10/27/2020. Abdominopelvic CT 07/26/2022.   FINDINGS: Segmentation: Conventional anatomy assumed, with the last open disc space designated L5-S1.Concordant with prior imaging.   Alignment:  Minimal convex left scoliosis.   Vertebrae: There is diffusely decreased T1 and T2 marrow signal without focally suspicious lesion, acute fracture or pars defect. The visualized sacroiliac joints appear unremarkable.   Conus medullaris: Extends to the L1 level and appears normal.   Paraspinal and other soft tissues: No significant paraspinal findings. Uterine  fibroids noted.   Disc levels:   Sagittal images demonstrate mild disc bulging at T11-12 and T12-L1 without resulting spinal stenosis or nerve root encroachment.   L1-2: Disc desiccation with mild bulging and bilateral facet hypertrophy. No significant spinal stenosis or nerve root encroachment.   L2-3: Preserved disc height and hydration. Mild disc bulging eccentric to the left and mild bilateral facet hypertrophy. No spinal stenosis or nerve root encroachment.   L3-4: Preserved disc height with mild disc bulging, facet and ligamentous hypertrophy. No significant central spinal stenosis. Mild foraminal narrowing bilaterally without definite L3 nerve root encroachment.   L4-5: Preserved disc height with mild disc bulging and a moderate-sized extraforaminal disc extrusion on the left. Resulting left foraminal narrowing and L4 nerve root encroachment. Mild facet and ligamentous hypertrophy. The left lateral recess is mildly narrowed. The spinal canal and right foramen are patent.   L5-S1: Preserved disc height and hydration. Moderate bilateral facet hypertrophy. No spinal stenosis or nerve root encroachment.   IMPRESSION: 1. Moderate-sized extraforaminal disc extrusion on the left at L4-5 with resulting left L4 nerve root encroachment. Correlate with specific radicular symptoms. 2. No other significant spinal stenosis or nerve root encroachment. 3. Nonspecific diffusely decreased T1 and T2 marrow signal without focally suspicious lesion. This may be related to anemia, smoking or obesity.     Electronically Signed   By: Carey Bullocks M.D.   On: 11/25/2022 13:21  Imaging: No results found.   PMFS History: Patient Active Problem List   Diagnosis Date Noted   Protrusion of lumbar intervertebral disc 11/29/2022   Biliary dyskinesia 02/01/2021   Right flank pain 10/27/2020   Iron deficiency anemia due to chronic blood loss 09/15/2020   Symptomatic anemia 07/20/2020    Depression, major, single episode, moderate (HCC) 07/15/2020   Vertigo 01/26/2020   GAD (generalized anxiety disorder) 07/28/2019   S/P cervical spinal fusion 03/20/2019   Osteoarthritis 11/18/2017   Insomnia 11/18/2017   Synovitis, villonodular, knee, right 10/28/2017   Tear of lateral meniscus of right knee, current 10/28/2017   Chondromalacia patellae, 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   Language difficulty 09/25/2016   Nausea with vomiting 07/18/2016   PUD (peptic ulcer disease) 07/18/2016   Slurred speech    GI bleed 05/02/2016   Sinus tachycardia 05/02/2016   Acute blood loss anemia 05/02/2016   Speech disorder 03/31/2015   Thyroid nodule 12/14/2014   Left knee pain 12/14/2014   Vitamin D deficiency 06/30/2014   Gastroesophageal reflux disease without esophagitis 06/29/2014   SYNCOPE 03/02/2010   HYPERTENSION, BENIGN 03/01/2010   Past Medical History:  Diagnosis Date   Anemia    Asthma    childhood   Carotidynia    COPD (chronic obstructive pulmonary disease) (HCC)    DDD (degenerative disc disease),  cervical    GERD (gastroesophageal reflux disease)    Hypertension    Knee pain    left meniscus tear s/p repair 11/2014   Seizures (HCC)    last seizure was over 10 years ago, unknown etiology   Stuttering    patient reports this started after her knee surgery in 11/2014.    Thyroid nodule     Family History  Problem Relation Age of Onset   Hypertension Mother    Lung cancer Father    Hypertension Brother    Colon cancer Neg Hx    Gastric cancer Neg Hx    Esophageal cancer Neg Hx     Past Surgical History:  Procedure Laterality Date   ANTERIOR CERVICAL DECOMP/DISCECTOMY FUSION N/A 03/20/2019   Procedure: Anterior Cervical Decompression Fusion - Cervical Four-Cervical Five - Cervical Five-Cervical Six;  Surgeon: Tia Alert, MD;  Location[facility];  Service: Neurosurgery;  Laterality: N/A;  Anterior Cervical Decompression Fusion - Cervical Four-Cervical Five - Cervical  Five-Cervical Six   BIOPSY  09/20/2020   Procedure: BIOPSY;  Surgeon: Corbin Ade, MD;  Location: [facility];  Service: Endoscopy;;   COLONOSCOPY N/A 08/23/2016   Procedure: COLONOSCOPY;  Surgeon: Corbin Ade, MD;  Location: [facility];  Service: Endoscopy;  Laterality: N/A;  1:00pm   COLONOSCOPY N/A 08/24/2020   Diverticulosis in the entire examined colon. Redundant colon. Exam otherwise normal   ESOPHAGOGASTRODUODENOSCOPY N/A 05/03/2016   Procedure: ESOPHAGOGASTRODUODENOSCOPY (EGD);  Surgeon: Corbin Ade, MD;  Location: [facility];  Service: Endoscopy;  Laterality: N/A;   ESOPHAGOGASTRODUODENOSCOPY N/A 08/23/2016   Procedure: ESOPHAGOGASTRODUODENOSCOPY (EGD);  Surgeon: Corbin Ade, MD;  Location: [facility];  Service: Endoscopy;  Laterality: N/A;   ESOPHAGOGASTRODUODENOSCOPY (EGD) WITH PROPOFOL N/A 09/20/2020   normal esophagus. Large hiatal hernia. Multiple gastric erosions (Cameron lesions). Normal duodenal bulb and second portion of the duodenum. Suspicion Sheria Lang lesions causing intermittent bleeding   KNEE ARTHROSCOPY Right    KNEE ARTHROSCOPY WITH MEDIAL MENISECTOMY Left 12/09/2014   Procedure: KNEE ARTHROSCOPY WITH PARTIAL MEDIAL AND LATERAL MENISECTOMY;  Surgeon: Darreld Mclean, MD;  Location: [facility];  Service: Orthopedics;  Laterality: Left;   TUBAL LIGATION     WISDOM TOOTH EXTRACTION     Social History   Occupational History   Occupation: Umemployed    Comment: Disability  Tobacco Use   Smoking status: Never   Smokeless tobacco: Never  Vaping Use   Vaping status: Never Used  Substance and Sexual Activity   Alcohol use: No   Drug use: No   Sexual activity: Not Currently    Birth control/protection: Surgical

## 2023-06-06 NOTE — Addendum Note (Signed)
Addended by: Rogers Seeds on: 06/06/2023 10:53 AM   Modules accepted: Orders

## 2023-06-13 ENCOUNTER — Ambulatory Visit: Payer: 59 | Admitting: Orthopaedic Surgery

## 2023-06-25 ENCOUNTER — Encounter: Payer: Self-pay | Admitting: Hematology

## 2023-07-02 ENCOUNTER — Inpatient Hospital Stay: Payer: 59 | Attending: Hematology

## 2023-07-02 DIAGNOSIS — Z801 Family history of malignant neoplasm of trachea, bronchus and lung: Secondary | ICD-10-CM | POA: Diagnosis not present

## 2023-07-02 DIAGNOSIS — Z79899 Other long term (current) drug therapy: Secondary | ICD-10-CM | POA: Insufficient documentation

## 2023-07-02 DIAGNOSIS — D509 Iron deficiency anemia, unspecified: Secondary | ICD-10-CM | POA: Insufficient documentation

## 2023-07-02 DIAGNOSIS — D500 Iron deficiency anemia secondary to blood loss (chronic): Secondary | ICD-10-CM

## 2023-07-02 LAB — CBC WITH DIFFERENTIAL/PLATELET
Abs Immature Granulocytes: 0.01 10*3/uL (ref 0.00–0.07)
Basophils Absolute: 0 10*3/uL (ref 0.0–0.1)
Basophils Relative: 0 %
Eosinophils Absolute: 0.1 10*3/uL (ref 0.0–0.5)
Eosinophils Relative: 2 %
HCT: 41 % (ref 36.0–46.0)
Hemoglobin: 12.9 g/dL (ref 12.0–15.0)
Immature Granulocytes: 0 %
Lymphocytes Relative: 34 %
Lymphs Abs: 1.9 10*3/uL (ref 0.7–4.0)
MCH: 28.5 pg (ref 26.0–34.0)
MCHC: 31.5 g/dL (ref 30.0–36.0)
MCV: 90.7 fL (ref 80.0–100.0)
Monocytes Absolute: 0.5 10*3/uL (ref 0.1–1.0)
Monocytes Relative: 9 %
Neutro Abs: 3 10*3/uL (ref 1.7–7.7)
Neutrophils Relative %: 55 %
Platelets: 241 10*3/uL (ref 150–400)
RBC: 4.52 MIL/uL (ref 3.87–5.11)
RDW: 13.4 % (ref 11.5–15.5)
WBC: 5.5 10*3/uL (ref 4.0–10.5)
nRBC: 0 % (ref 0.0–0.2)

## 2023-07-02 LAB — FERRITIN: Ferritin: 21 ng/mL (ref 11–307)

## 2023-07-02 LAB — IRON AND TIBC
Iron: 58 ug/dL (ref 28–170)
Saturation Ratios: 13 % (ref 10.4–31.8)
TIBC: 459 ug/dL — ABNORMAL HIGH (ref 250–450)
UIBC: 401 ug/dL

## 2023-07-05 ENCOUNTER — Other Ambulatory Visit: Payer: Self-pay | Admitting: Family

## 2023-07-05 DIAGNOSIS — G8929 Other chronic pain: Secondary | ICD-10-CM

## 2023-07-08 NOTE — Progress Notes (Unsigned)
 [facility] [address], [place] [address]   CLINIC:  Medical Oncology/Hematology  PCP:  [name], FNP [address] [place] [address] [phone]   REASON FOR VISIT:  Follow-up for iron deficiency anemia   CURRENT THERAPY: Intermittent IV iron  INTERVAL HISTORY:   [name] 53 y.o. female returns for routine follow-up of her iron deficiency anemia.  She was last seen by Rojelio Brenner PA-C on 04/24/2023.     At today's visit, she reports feeling poorly due to back pain, which may require surgery in the near future per her report.  *** *** She received IV Feraheme x 2 in December/January, and reports feeling transient increase in energy after IV iron. *** She had recurrent fatigue after a few weeks.  *** She reports mild dyspnea on exertion.   ***No chest pain, pica, palpitations, lightheadedness, or syncope.    She had two episodes of gross hematochezia last month, which she has informed her gastroenterologist of.***  No melena.***  She has not had any further vaginal bleeding since her isolated episode in late 2022.  She has 25***% energy and 75***% appetite. She endorses that she is maintaining a stable weight.  ASSESSMENT & PLAN:  1.  Iron deficiency anemia with suspected occult GI bleed - ED on 07/18/2020 with Hgb 5.7, fatigue, lightheadedness.  Denied overt GI bleeding and was Hemoccult-negative, but does have a history of peptic ulcer disease.  She was transfused 2 units PRBCs on 07/18/2020, and followed up with gastroenterology.  - Colonoscopy (08/24/2020) showed diverticulosis, but was otherwise normal. - EGD (09/20/2020): Large hiatal hernia, Cameron lesions and antral erosions (suspicious for causing intermittent bleeding in the setting of ongoing NSAID use per GI note) - EGD and colonoscopy at [facility] on 03/30/2022, which showed multiple scattered pancolonic diverticula of moderate severity otherwise normal  colonoscopy; EGD was normal  - She is on Protonix for peptic ulcer disease - Hematology work-up (07/29/2020) showed Hgb 8.5 (microcytic with MCV 78.2).  Ferritin was 7, with 32% iron saturation.  Reticulocytes 3.4 (reticulocyte index 1.61, consistent with hypoproliferation in the setting of iron deficiency).   - Most recent IV iron with Feraheme x 2 in December 2024/January 2025 - Per outside medical records, patient had mild postinfusion reaction with rash on bilateral arms after Venofer infusion on 09/29/2021.  Tolerated Feraheme well with PREMEDICATION. - Intermittent rectal bleeding.  No recent signs of melena.*** - Follows with [facility] Berna Bue PA-C & Dr. Briant Sites)  - Symptomatic with fatigue and dyspnea on exertion*** - Most recent labs (07/02/2023): Hgb 12.9/MCV 90.7.  Ferritin 21, iron saturation 13%, TIBC 459.  Labs from October 2023 showed CMP unremarkable, LDH normal; normal B12, MMA, folate, SPEP, haptoglobin. - PLAN: *** Alternative IRON due to upcoming MRI *** - *** Recommend additional IV Feraheme x 2 - PREMEDICATE WITH STEROIDS due to possible reaction after most recent Venofer.??  ***?? - Continue GI follow-up.  - RTC in 3 months for repeat CBC and iron panel followed by office visit  PLAN SUMMARY:  >> *** IV iron *** >> Labs in 3 months = CBC/D, ferritin, iron/TIBC >> OFFICE visit in 3 months    REVIEW OF SYSTEMS:***  Review of Systems  Constitutional:  Positive for fatigue. Negative for appetite change, chills, diaphoresis, fever and unexpected weight change.  HENT:   Negative for lump/mass and nosebleeds.   Eyes:  Negative for eye problems.  Respiratory:  Positive for shortness of breath (with exertion). Negative for cough and hemoptysis.   Cardiovascular:  Negative for chest pain, leg swelling and palpitations.  Gastrointestinal:  Positive for diarrhea. Negative for abdominal pain, blood in stool, constipation, nausea and vomiting.  Genitourinary:   Negative for hematuria.   Musculoskeletal:  Positive for back pain.  Skin: Negative.   Neurological:  Negative for dizziness, headaches, light-headedness and numbness.  Hematological:  Does not bruise/bleed easily.  Psychiatric/Behavioral:  Positive for sleep disturbance. Negative for depression. The patient is not nervous/anxious.      PHYSICAL EXAM:  ECOG PERFORMANCE STATUS: 1 - Symptomatic but completely ambulatory *** There were no vitals filed for this visit.   There were no vitals filed for this visit.   Physical Exam Constitutional:      Appearance: Normal appearance. She is obese.  Cardiovascular:     Heart sounds: Normal heart sounds.  Pulmonary:     Breath sounds: Normal breath sounds.  Neurological:     General: No focal deficit present.     Mental Status: Mental status is at baseline.  Psychiatric:        Behavior: Behavior normal. Behavior is cooperative.     PAST MEDICAL/SURGICAL HISTORY:  Past Medical History:  Diagnosis Date   Anemia    Asthma    childhood   Carotidynia    COPD (chronic obstructive pulmonary disease) (HCC)    DDD (degenerative disc disease), cervical    GERD (gastroesophageal reflux disease)    Hypertension    Knee pain    left meniscus tear s/p repair 11/2014   Seizures (HCC)    last seizure was over 10 years ago, unknown etiology   Stuttering    patient reports this started after her knee surgery in 11/2014.    Thyroid nodule    Past Surgical History:  Procedure Laterality Date   ANTERIOR CERVICAL DECOMP/DISCECTOMY FUSION N/A 03/20/2019   Procedure: Anterior Cervical Decompression Fusion - Cervical Four-Cervical Five - Cervical Five-Cervical Six;  Surgeon: Tia Alert, MD;  Location: [facility] OR;  Service: Neurosurgery;  Laterality: N/A;  Anterior Cervical Decompression Fusion - Cervical Four-Cervical Five - Cervical Five-Cervical Six   BIOPSY  09/20/2020   Procedure: BIOPSY;  Surgeon: Corbin Ade, MD;  [facility];   Service: Endoscopy;;   COLONOSCOPY N/A 08/23/2016   Procedure: COLONOSCOPY;  Surgeon: Corbin Ade, MD;  [facility]: [facility];  Service: Endoscopy;  Laterality: N/A;  1:00pm   COLONOSCOPY N/A 08/24/2020   Diverticulosis in the entire examined colon. Redundant colon. Exam otherwise normal   ESOPHAGOGASTRODUODENOSCOPY N/A 05/03/2016   Procedure: ESOPHAGOGASTRODUODENOSCOPY (EGD);  Surgeon: Corbin Ade, MD;  [facility];  Service: Endoscopy;  Laterality: N/A;   ESOPHAGOGASTRODUODENOSCOPY N/A 08/23/2016   Procedure: ESOPHAGOGASTRODUODENOSCOPY (EGD);  Surgeon: Corbin Ade, MD;  [facility];  Service: Endoscopy;  Laterality: N/A;   ESOPHAGOGASTRODUODENOSCOPY (EGD) WITH PROPOFOL N/A 09/20/2020   normal esophagus. Large hiatal hernia. Multiple gastric erosions (Cameron lesions). Normal duodenal bulb and second portion of the duodenum. Suspicion Sheria Lang lesions causing intermittent bleeding   KNEE ARTHROSCOPY Right    KNEE ARTHROSCOPY WITH MEDIAL MENISECTOMY Left 12/09/2014   Procedure: KNEE ARTHROSCOPY WITH PARTIAL MEDIAL AND LATERAL MENISECTOMY;  Surgeon: Darreld Mclean, MD;  Location: [facility];  Service: Orthopedics;  Laterality: Left;   TUBAL LIGATION     WISDOM TOOTH EXTRACTION      SOCIAL HISTORY:  Social History   Socioeconomic History   Marital  status: Single    Spouse name: Not on file   Number of children: 1   Years of education: 12   Highest education level: Not on file  Occupational History   Occupation: Umemployed    Comment: Disability  Tobacco Use   Smoking status: Never   Smokeless tobacco: Never  Vaping Use   Vaping status: Never Used  Substance and Sexual Activity   Alcohol use: No   Drug use: No   Sexual activity: Not Currently    Birth control/protection: Surgical  Other Topics Concern   Not on file  Social History Narrative   Lives at home alone.   Disabled - speech difficulties, cannot drive   Right-handed.   Occasional  caffeine use.   Her daughter lives about 30-45 minutes away   She has lots of family nearby - uncle, mother, sister, brother   Social Drivers of Corporate investment banker Strain: Low Risk  (12/27/2022)   Overall Financial Resource Strain (CARDIA)    Difficulty of Paying Living Expenses: Not hard at all  Food Insecurity: No Food Insecurity (12/27/2022)   Hunger Vital Sign    Worried About Running Out of Food in the Last Year: Never true    Ran Out of Food in the Last Year: Never true  Transportation Needs: No Transportation Needs (12/27/2022)   PRAPARE - Administrator, Civil Service (Medical): No    Lack of Transportation (Non-Medical): No  Physical Activity: Inactive (12/27/2022)   Exercise Vital Sign    Days of Exercise per Week: 0 days    Minutes of Exercise per Session: 0 min  Stress: No Stress Concern Present (12/27/2022)   [employer] of Occupational Health - Occupational Stress Questionnaire    Feeling of Stress : Not at all  Social Connections: Moderately Isolated (12/27/2022)   Social Connection and Isolation Panel [NHANES]    Frequency of Communication with Friends and Family: More than three times a week    Frequency of Social Gatherings with Friends and Family: More than three times a week    Attends Religious Services: More than 4 times per year    Active Member of [employer] or Organizations: No    Attends Banker Meetings: Never    Marital Status: Never married  Intimate Partner Violence: Not At Risk (12/27/2022)   Humiliation, Afraid, Rape, and Kick questionnaire    Fear of Current or Ex-Partner: No    Emotionally Abused: No    Physically Abused: No    Sexually Abused: No    FAMILY HISTORY:  Family History  Problem Relation Age of Onset   Hypertension Mother    Lung cancer Father    Hypertension Brother    Colon cancer Neg Hx    Gastric cancer Neg Hx    Esophageal cancer Neg Hx     CURRENT MEDICATIONS:  Outpatient Encounter  Medications as of 07/09/2023  Medication Sig   amLODipine (NORVASC) 10 MG tablet Take 1 tablet (10 mg total) by mouth daily.   baclofen (LIORESAL) 10 MG tablet TAKE ONE TABLET THREE TIMES DAILY   buPROPion (WELLBUTRIN XL) 150 MG 24 hr tablet Take 1 tablet (150 mg total) by mouth daily.   busPIRone (BUSPAR) 5 MG tablet TAKE ONE TABLET BY MOUTH UP TO THREE TIMES DAILY.   celecoxib (CELEBREX) 200 MG capsule Take 1 capsule (200 mg total) by mouth 2 (two) times daily.   escitalopram (LEXAPRO) 20 MG tablet Take 1 tablet (20 mg  total) by mouth daily.   hydrocortisone (ANUSOL-HC) 25 MG suppository Place 25 mg rectally 2 (two) times daily.   pantoprazole (PROTONIX) 40 MG tablet TAKE ONE TABLET EVERY DAY BEFORE BREAKFAST   traMADol (ULTRAM) 50 MG tablet Take 1 tablet (50 mg total) by mouth every 8 (eight) hours as needed.   No facility-administered encounter medications on file as of 07/09/2023.    ALLERGIES:  Allergies  Allergen Reactions   Venofer [Iron Sucrose] Rash    LABORATORY DATA:  I have reviewed the labs as listed.  CBC    Component Value Date/Time   WBC 5.5 07/02/2023 1246   RBC 4.52 07/02/2023 1246   HGB 12.9 07/02/2023 1246   HGB 9.6 (L) 02/19/2023 1500   HCT 41.0 07/02/2023 1246   HCT 30.3 (L) 02/19/2023 1500   PLT 241 07/02/2023 1246   PLT 282 02/19/2023 1500   MCV 90.7 07/02/2023 1246   MCV 88 02/19/2023 1500   MCH 28.5 07/02/2023 1246   MCHC 31.5 07/02/2023 1246   RDW 13.4 07/02/2023 1246   RDW 14.7 02/19/2023 1500   LYMPHSABS 1.9 07/02/2023 1246   LYMPHSABS 1.7 02/19/2023 1500   MONOABS 0.5 07/02/2023 1246   EOSABS 0.1 07/02/2023 1246   EOSABS 0.1 02/19/2023 1500   BASOSABS 0.0 07/02/2023 1246   BASOSABS 0.0 02/19/2023 1500      Latest Ref Rng & Units 02/19/2023    3:00 PM 11/30/2022   10:15 AM 09/06/2022   10:55 AM  CMP  Glucose 70 - 99 mg/dL 85  161  84   BUN 6 - 24 mg/dL 12  15  21    Creatinine 0.57 - 1.00 mg/dL 0.96  0.45  4.09   Sodium 134 - 144 mmol/L  143  138  140   Potassium 3.5 - 5.2 mmol/L 4.2  3.6  4.0   Chloride 96 - 106 mmol/L 105  105  102   CO2 20 - 29 mmol/L 23  25  22    Calcium 8.7 - 10.2 mg/dL 9.9  9.3  9.8   Total Protein 6.0 - 8.5 g/dL 7.1  7.8  6.9   Total Bilirubin 0.0 - 1.2 mg/dL 0.2  0.7  0.2   Alkaline Phos 44 - 121 IU/L 84  77  102   AST 0 - 40 IU/L 26  26  10    ALT 0 - 32 IU/L 24  30  15      DIAGNOSTIC IMAGING:  I have independently reviewed the relevant imaging and discussed with the patient.   WRAP UP:  All questions were answered. The patient knows to call the clinic with any problems, questions or concerns.  Medical decision making: Low  Time spent on visit: I spent 15 minutes counseling the patient face to face. The total time spent in the appointment was 22 minutes and more than 50% was on counseling.  Carnella Guadalajara, PA-C  ***

## 2023-07-09 ENCOUNTER — Other Ambulatory Visit: Payer: Self-pay | Admitting: Family

## 2023-07-09 ENCOUNTER — Inpatient Hospital Stay (HOSPITAL_BASED_OUTPATIENT_CLINIC_OR_DEPARTMENT_OTHER): Payer: 59 | Admitting: Physician Assistant

## 2023-07-09 DIAGNOSIS — D509 Iron deficiency anemia, unspecified: Secondary | ICD-10-CM | POA: Diagnosis not present

## 2023-07-09 DIAGNOSIS — F411 Generalized anxiety disorder: Secondary | ICD-10-CM

## 2023-07-09 DIAGNOSIS — D500 Iron deficiency anemia secondary to blood loss (chronic): Secondary | ICD-10-CM

## 2023-07-09 DIAGNOSIS — Z79899 Other long term (current) drug therapy: Secondary | ICD-10-CM | POA: Diagnosis not present

## 2023-07-09 DIAGNOSIS — Z801 Family history of malignant neoplasm of trachea, bronchus and lung: Secondary | ICD-10-CM | POA: Diagnosis not present

## 2023-07-09 DIAGNOSIS — K279 Peptic ulcer, site unspecified, unspecified as acute or chronic, without hemorrhage or perforation: Secondary | ICD-10-CM

## 2023-07-09 DIAGNOSIS — I10 Essential (primary) hypertension: Secondary | ICD-10-CM

## 2023-07-09 NOTE — Patient Instructions (Signed)
 [facility] at [facility] Discharge Instructions  You were seen today by Rojelio Brenner PA-C for your iron deficiency anemia.  Your blood levels look much better!  Your iron levels are still low. We will schedule you for IV iron. However, we will need to use a different type of IV iron so that we do not interfere with your upcoming MRI.  This iron will be given once a week x 4 doses.  FOLLOW-UP APPOINTMENT: 3 months   - - - - - - - - - - - - - - - - - -    Thank you for choosing [facility] at [facility] to provide your oncology and hematology care.  To afford each patient quality time with our provider, please arrive at least 15 minutes before your scheduled appointment time.   If you have a lab appointment with the Cancer Center please come in thru the Main Entrance and check in at the main information desk.  You need to re-schedule your appointment should you arrive 10 or more minutes late.  We strive to give you quality time with our providers, and arriving late affects you and other patients whose appointments are after yours.  Also, if you no show three or more times for appointments you may be dismissed from the clinic at the providers discretion.     Again, thank you for choosing [facility].  Our hope is that these requests will decrease the amount of time that you wait before being seen by our physicians.       _____________________________________________________________  Should you have questions after your visit to [facility], please contact our office at [phone] and follow the prompts.  Our office hours are 8:00 a.m. and 4:30 p.m. Monday - Friday.  Please note that voicemails left after 4:00 p.m. may not be returned until the following business day.  We are closed weekends and major holidays.  You do have access to a nurse 24-7, just call the main number to the clinic [phone] and do not press  any options, hold on the line and a nurse will answer the phone.    For prescription refill requests, have your pharmacy contact our office and allow 72 hours.    Due to Covid, you will need to wear a mask upon entering the hospital. If you do not have a mask, a mask will be given to you at the Main Entrance upon arrival. For doctor visits, patients may have 1 support person age 80 or older with them. For treatment visits, patients can not have anyone with them due to social distancing guidelines and our immunocompromised population.

## 2023-07-09 NOTE — Telephone Encounter (Signed)
 Brittany Durham NTBS in April for 6 mos FU RF sent to pharmacy

## 2023-07-10 NOTE — Telephone Encounter (Signed)
 Made appt April 8

## 2023-07-16 ENCOUNTER — Inpatient Hospital Stay: Payer: 59 | Attending: Hematology

## 2023-07-16 VITALS — BP 138/80 | HR 90 | Temp 97.5°F | Resp 20

## 2023-07-16 DIAGNOSIS — D509 Iron deficiency anemia, unspecified: Secondary | ICD-10-CM | POA: Insufficient documentation

## 2023-07-16 DIAGNOSIS — D500 Iron deficiency anemia secondary to blood loss (chronic): Secondary | ICD-10-CM

## 2023-07-16 MED ORDER — CETIRIZINE HCL 10 MG/ML IV SOLN
10.0000 mg | Freq: Once | INTRAVENOUS | Status: AC
  Administered 2023-07-16: 10 mg via INTRAVENOUS
  Filled 2023-07-16: qty 1

## 2023-07-16 MED ORDER — FAMOTIDINE IN NACL 20-0.9 MG/50ML-% IV SOLN
20.0000 mg | Freq: Once | INTRAVENOUS | Status: AC
  Administered 2023-07-16: 20 mg via INTRAVENOUS
  Filled 2023-07-16: qty 50

## 2023-07-16 MED ORDER — SODIUM CHLORIDE 0.9 % IV SOLN: INTRAVENOUS | Status: DC

## 2023-07-16 MED ORDER — SODIUM CHLORIDE 0.9 % IV SOLN
250.0000 mg | Freq: Once | INTRAVENOUS | Status: AC
  Administered 2023-07-16: 250 mg via INTRAVENOUS
  Filled 2023-07-16: qty 20

## 2023-07-16 MED ORDER — METHYLPREDNISOLONE SODIUM SUCC 125 MG IJ SOLR
125.0000 mg | Freq: Once | INTRAMUSCULAR | Status: AC
  Administered 2023-07-16: 125 mg via INTRAVENOUS
  Filled 2023-07-16: qty 2

## 2023-07-16 MED ORDER — ACETAMINOPHEN 325 MG PO TABS
650.0000 mg | ORAL_TABLET | Freq: Once | ORAL | Status: AC
  Administered 2023-07-16: 650 mg via ORAL
  Filled 2023-07-16: qty 2

## 2023-07-16 NOTE — Progress Notes (Signed)
Patient presents today for Ferrlecit infusion per providers order.  Vital signs WNL.  Patient has no new complaints at this time.    Peripheral IV started and blood return noted pre and post infusion.  Stable during infusion without adverse affects.  Vital signs stable.  No complaints at this time.  Discharge from clinic ambulatory in stable condition.  Alert and oriented X 3.  Follow up with [facility] as scheduled.  

## 2023-07-16 NOTE — Patient Instructions (Signed)
 [facility] - A DEPT OF [facility]  Discharge Instructions: Thank you for choosing [facility] to provide your oncology and hematology care.  If you have a lab appointment with the Cancer Center - please note that after April 8th, 2024, all labs will be drawn in the cancer center.  You do not have to check in or register with the main entrance as you have in the past but will complete your check-in in the cancer center.  Wear comfortable clothing and clothing appropriate for easy access to any Portacath or PICC line.   We strive to give you quality time with your provider. You may need to reschedule your appointment if you arrive late (15 or more minutes).  Arriving late affects you and other patients whose appointments are after yours.  Also, if you miss three or more appointments without notifying the office, you may be dismissed from the clinic at the provider's discretion.      For prescription refill requests, have your pharmacy contact our office and allow 72 hours for refills to be completed.    Today you received the following chemotherapy and/or immunotherapy agents ferrlecit      To help prevent nausea and vomiting after your treatment, we encourage you to take your nausea medication as directed.  BELOW ARE SYMPTOMS THAT SHOULD BE REPORTED IMMEDIATELY: *FEVER GREATER THAN 100.4 F (38 C) OR HIGHER *CHILLS OR SWEATING *NAUSEA AND VOMITING THAT IS NOT CONTROLLED WITH YOUR NAUSEA MEDICATION *UNUSUAL SHORTNESS OF BREATH *UNUSUAL BRUISING OR BLEEDING *URINARY PROBLEMS (pain or burning when urinating, or frequent urination) *BOWEL PROBLEMS (unusual diarrhea, constipation, pain near the anus) TENDERNESS IN MOUTH AND THROAT WITH OR WITHOUT PRESENCE OF ULCERS (sore throat, sores in mouth, or a toothache) UNUSUAL RASH, SWELLING OR PAIN  UNUSUAL VAGINAL DISCHARGE OR ITCHING   Items with * indicate a potential emergency and should be followed up  as soon as possible or go to the Emergency Department if any problems should occur.  Please show the CHEMOTHERAPY ALERT CARD or IMMUNOTHERAPY ALERT CARD at check-in to the Emergency Department and triage nurse.  Should you have questions after your visit or need to cancel or reschedule your appointment, please contact [facility]  - A DEPT OF [facility] [phone]  and follow the prompts.  Office hours are 8:00 a.m. to 4:30 p.m. Monday - Friday. Please note that voicemails left after 4:00 p.m. may not be returned until the following business day.  We are closed weekends and major holidays. You have access to a nurse at all times for urgent questions. Please call the main number to the clinic [phone] and follow the prompts.  For any non-urgent questions, you may also contact your provider using MyChart. We now offer e-Visits for anyone 65 and older to request care online for non-urgent symptoms. For details visit [email or web address].   Also download the MyChart app! Go to the app store, search "MyChart", open the app, select [place], and log in with your MyChart username and password.

## 2023-07-23 ENCOUNTER — Inpatient Hospital Stay: Payer: 59

## 2023-07-23 VITALS — BP 131/87 | HR 93 | Temp 97.9°F | Resp 18 | Wt 198.0 lb

## 2023-07-23 DIAGNOSIS — D500 Iron deficiency anemia secondary to blood loss (chronic): Secondary | ICD-10-CM

## 2023-07-23 DIAGNOSIS — D509 Iron deficiency anemia, unspecified: Secondary | ICD-10-CM | POA: Diagnosis not present

## 2023-07-23 MED ORDER — METHYLPREDNISOLONE SODIUM SUCC 125 MG IJ SOLR
125.0000 mg | Freq: Once | INTRAMUSCULAR | Status: AC
  Administered 2023-07-23: 125 mg via INTRAVENOUS
  Filled 2023-07-23: qty 2

## 2023-07-23 MED ORDER — SODIUM CHLORIDE 0.9 % IV SOLN
250.0000 mg | Freq: Once | INTRAVENOUS | Status: AC
  Administered 2023-07-23: 250 mg via INTRAVENOUS
  Filled 2023-07-23: qty 20

## 2023-07-23 MED ORDER — ACETAMINOPHEN 325 MG PO TABS
650.0000 mg | ORAL_TABLET | Freq: Once | ORAL | Status: AC
Start: 2023-07-23 — End: 2023-07-23
  Administered 2023-07-23: 650 mg via ORAL
  Filled 2023-07-23: qty 2

## 2023-07-23 MED ORDER — FAMOTIDINE IN NACL 20-0.9 MG/50ML-% IV SOLN
20.0000 mg | Freq: Once | INTRAVENOUS | Status: AC
  Administered 2023-07-23: 20 mg via INTRAVENOUS
  Filled 2023-07-23: qty 50

## 2023-07-23 MED ORDER — SODIUM CHLORIDE 0.9 % IV SOLN: INTRAVENOUS | Status: DC

## 2023-07-23 MED ORDER — CETIRIZINE HCL 10 MG/ML IV SOLN
10.0000 mg | Freq: Once | INTRAVENOUS | Status: AC
Start: 2023-07-23 — End: 2023-07-23
  Administered 2023-07-23: 10 mg via INTRAVENOUS
  Filled 2023-07-23: qty 1

## 2023-07-23 NOTE — Progress Notes (Signed)
 Patient tolerated iron infusion with no complaints voiced.  Peripheral IV site clean and dry with good blood return noted before and after infusion.  Band aid applied.  VSS with discharge and left in satisfactory condition with no s/s of distress noted.

## 2023-07-23 NOTE — Patient Instructions (Signed)

## 2023-07-30 ENCOUNTER — Inpatient Hospital Stay: Payer: 59

## 2023-07-30 VITALS — BP 133/84 | HR 92 | Temp 97.0°F | Resp 19 | Wt 198.0 lb

## 2023-07-30 DIAGNOSIS — D500 Iron deficiency anemia secondary to blood loss (chronic): Secondary | ICD-10-CM

## 2023-07-30 DIAGNOSIS — D509 Iron deficiency anemia, unspecified: Secondary | ICD-10-CM | POA: Diagnosis not present

## 2023-07-30 MED ORDER — ACETAMINOPHEN 325 MG PO TABS
650.0000 mg | ORAL_TABLET | Freq: Once | ORAL | Status: AC
  Administered 2023-07-30: 650 mg via ORAL
  Filled 2023-07-30: qty 2

## 2023-07-30 MED ORDER — CETIRIZINE HCL 10 MG/ML IV SOLN
10.0000 mg | Freq: Once | INTRAVENOUS | Status: AC
Start: 2023-07-30 — End: 2023-07-30
  Administered 2023-07-30: 10 mg via INTRAVENOUS
  Filled 2023-07-30: qty 1

## 2023-07-30 MED ORDER — FAMOTIDINE IN NACL 20-0.9 MG/50ML-% IV SOLN
20.0000 mg | Freq: Once | INTRAVENOUS | Status: AC
  Administered 2023-07-30: 20 mg via INTRAVENOUS
  Filled 2023-07-30: qty 50

## 2023-07-30 MED ORDER — METHYLPREDNISOLONE SODIUM SUCC 125 MG IJ SOLR
125.0000 mg | Freq: Once | INTRAMUSCULAR | Status: AC
  Administered 2023-07-30: 125 mg via INTRAVENOUS
  Filled 2023-07-30: qty 2

## 2023-07-30 MED ORDER — SODIUM CHLORIDE 0.9 % IV SOLN
250.0000 mg | Freq: Once | INTRAVENOUS | Status: AC
  Administered 2023-07-30: 250 mg via INTRAVENOUS
  Filled 2023-07-30: qty 20

## 2023-07-30 MED ORDER — SODIUM CHLORIDE 0.9 % IV SOLN: INTRAVENOUS | Status: DC

## 2023-07-30 NOTE — Progress Notes (Signed)
 Patient tolerated iron infusion with no complaints voiced.  Peripheral IV site clean and dry with good blood return noted before and after infusion.  Band aid applied.  VSS with discharge and left in satisfactory condition with no s/s of distress noted.

## 2023-07-30 NOTE — Patient Instructions (Signed)

## 2023-08-06 ENCOUNTER — Inpatient Hospital Stay: Payer: 59

## 2023-08-06 VITALS — BP 133/92 | HR 95 | Temp 97.6°F | Resp 18

## 2023-08-06 DIAGNOSIS — D500 Iron deficiency anemia secondary to blood loss (chronic): Secondary | ICD-10-CM

## 2023-08-06 DIAGNOSIS — D509 Iron deficiency anemia, unspecified: Secondary | ICD-10-CM | POA: Diagnosis not present

## 2023-08-06 MED ORDER — CETIRIZINE HCL 10 MG/ML IV SOLN
10.0000 mg | Freq: Once | INTRAVENOUS | Status: AC
  Administered 2023-08-06: 10 mg via INTRAVENOUS
  Filled 2023-08-06: qty 1

## 2023-08-06 MED ORDER — SODIUM CHLORIDE 0.9 % IV SOLN
250.0000 mg | Freq: Once | INTRAVENOUS | Status: AC
  Administered 2023-08-06: 250 mg via INTRAVENOUS
  Filled 2023-08-06: qty 20

## 2023-08-06 MED ORDER — METHYLPREDNISOLONE SODIUM SUCC 125 MG IJ SOLR
125.0000 mg | Freq: Once | INTRAMUSCULAR | Status: AC
  Administered 2023-08-06: 125 mg via INTRAVENOUS
  Filled 2023-08-06: qty 2

## 2023-08-06 MED ORDER — FAMOTIDINE IN NACL 20-0.9 MG/50ML-% IV SOLN
20.0000 mg | Freq: Once | INTRAVENOUS | Status: AC
Start: 2023-08-06 — End: 2023-08-06
  Administered 2023-08-06: 20 mg via INTRAVENOUS
  Filled 2023-08-06: qty 50

## 2023-08-06 MED ORDER — SODIUM CHLORIDE 0.9 % IV SOLN: INTRAVENOUS | Status: DC

## 2023-08-06 MED ORDER — ACETAMINOPHEN 325 MG PO TABS
650.0000 mg | ORAL_TABLET | Freq: Once | ORAL | Status: AC
  Administered 2023-08-06: 650 mg via ORAL
  Filled 2023-08-06: qty 2

## 2023-08-06 NOTE — Patient Instructions (Signed)
Sodium Ferric Gluconate Complex Injection What is this medication? SODIUM FERRIC GLUCONATE COMPLEX (SOE dee um FER ik GLOO koe nate KOM pleks) treats low levels of iron (iron deficiency anemia) in people with kidney disease. Iron is a mineral that plays an important role in making red blood cells, which carry oxygen from your lungs to the rest of your body. This medicine may be used for other purposes; ask your health care provider or pharmacist if you have questions. COMMON BRAND NAME(S): Ferrlecit, Nulecit What should I tell my care team before I take this medication? They need to know if you have any of the following conditions: Anemia that is not from iron deficiency High levels of iron in the blood An unusual or allergic reaction to iron, other medications, foods, dyes, or preservatives Pregnant or are trying to become pregnant Breast-feeding How should I use this medication? This medication is injected into a vein. It is given by your care team in a hospital or clinic setting. Talk to your care team about the use of this medication in children. While it may be prescribed for children as young as 6 years for selected conditions, precautions do apply. Overdosage: If you think you have taken too much of this medicine contact a poison control center or emergency room at once. NOTE: This medicine is only for you. Do not share this medicine with others. What if I miss a dose? It is important not to miss your dose. Call your care team if you are unable to keep an appointment. What may interact with this medication? Do not take this medication with any of the following: Deferasirox Deferoxamine Dimercaprol This medication may also interact with the following: Other iron products This list may not describe all possible interactions. Give your health care provider a list of all the medicines, herbs, non-prescription drugs, or dietary supplements you use. Also tell them if you smoke, drink  alcohol, or use illegal drugs. Some items may interact with your medicine. What should I watch for while using this medication? Your condition will be monitored carefully while you are receiving this medication. Visit your care team for regular checks on your progress. You may need blood work while you are taking this medication. What side effects may I notice from receiving this medication? Side effects that you should report to your care team as soon as possible: Allergic reactions--skin rash, itching, hives, swelling of the face, lips, tongue, or throat Low blood pressure--dizziness, feeling faint or lightheaded, blurry vision Shortness of breath Side effects that usually do not require medical attention (report to your care team if they continue or are bothersome): Flushing Headache Joint pain Muscle pain Nausea Pain, redness, or irritation at injection site This list may not describe all possible side effects. Call your doctor for medical advice about side effects. You may report side effects to FDA at 1-800-FDA-1088. Where should I keep my medication? This medication is given in a hospital or clinic and will not be stored at home. NOTE: This sheet is a summary. It may not cover all possible information. If you have questions about this medicine, talk to your doctor, pharmacist, or health care provider.  2024 Elsevier/Gold Standard (2020-09-23 00:00:00)

## 2023-08-06 NOTE — Progress Notes (Signed)
 Patient tolerated iron infusion with no complaints voiced.  Peripheral IV site clean and dry with good blood return noted before and after infusion.  Band aid applied. Pt observed for 30 minutes post iron infusion without any complications.  VSS with discharge and left in satisfactory condition with no s/s of distress noted. All follow ups as scheduled.   Brittany Durham [employer]

## 2023-08-13 ENCOUNTER — Encounter: Payer: Self-pay | Admitting: Physician Assistant

## 2023-08-17 ENCOUNTER — Other Ambulatory Visit: Payer: Self-pay | Admitting: Family

## 2023-08-17 DIAGNOSIS — F411 Generalized anxiety disorder: Secondary | ICD-10-CM

## 2023-08-20 ENCOUNTER — Encounter: Payer: Self-pay | Admitting: Family

## 2023-08-20 ENCOUNTER — Ambulatory Visit (INDEPENDENT_AMBULATORY_CARE_PROVIDER_SITE_OTHER): Payer: 59 | Admitting: Family

## 2023-08-20 VITALS — BP 111/75 | HR 107 | Temp 97.0°F | Ht 67.0 in | Wt 200.2 lb

## 2023-08-20 DIAGNOSIS — M150 Primary generalized (osteo)arthritis: Secondary | ICD-10-CM

## 2023-08-20 DIAGNOSIS — K219 Gastro-esophageal reflux disease without esophagitis: Secondary | ICD-10-CM

## 2023-08-20 DIAGNOSIS — I10 Essential (primary) hypertension: Secondary | ICD-10-CM | POA: Diagnosis not present

## 2023-08-20 DIAGNOSIS — R4781 Slurred speech: Secondary | ICD-10-CM

## 2023-08-20 DIAGNOSIS — F411 Generalized anxiety disorder: Secondary | ICD-10-CM

## 2023-08-20 DIAGNOSIS — F321 Major depressive disorder, single episode, moderate: Secondary | ICD-10-CM | POA: Diagnosis not present

## 2023-08-20 DIAGNOSIS — G4700 Insomnia, unspecified: Secondary | ICD-10-CM | POA: Diagnosis not present

## 2023-08-20 DIAGNOSIS — M546 Pain in thoracic spine: Secondary | ICD-10-CM

## 2023-08-20 DIAGNOSIS — E559 Vitamin D deficiency, unspecified: Secondary | ICD-10-CM | POA: Diagnosis not present

## 2023-08-20 DIAGNOSIS — G8929 Other chronic pain: Secondary | ICD-10-CM

## 2023-08-20 MED ORDER — ESCITALOPRAM OXALATE 20 MG PO TABS: 20.0000 mg | ORAL_TABLET | Freq: Every day | ORAL | 0 refills | Status: DC

## 2023-08-20 MED ORDER — BUPROPION HCL ER (XL) 150 MG PO TB24: 150.0000 mg | ORAL_TABLET | Freq: Every day | ORAL | 5 refills | Status: DC

## 2023-08-20 MED ORDER — CELECOXIB 200 MG PO CAPS: 200.0000 mg | ORAL_CAPSULE | Freq: Two times a day (BID) | ORAL | 1 refills | Status: DC

## 2023-08-20 MED ORDER — AMLODIPINE BESYLATE 10 MG PO TABS: 10.0000 mg | ORAL_TABLET | Freq: Every day | ORAL | 0 refills | Status: DC

## 2023-08-20 NOTE — Progress Notes (Signed)
 Subjective:    Patient ID: Brittany Durham, female    DOB: April 02, 1971, 53 y.o.   MRN: [id number]  Chief Complaint  Patient presents with   Medical Management of Chronic Issues    6 mth    PT presents to the office today for chronic follow up. Pt has seen Neurologists for Speech difficulties and seizures, but has not seen them in awhile.     She is followed by Hematologists for iron deficiency and getting iron infusions.   She is followed by Ortho for back pain and doing PT. Did get injections, but did not help. Has a MRI pending.  She if followed by Cardiologists for syncope and palpitations. These are were stable and told to follow up in a year.    Hypertension This is a chronic problem. The current episode started more than 1 year ago. The problem has been resolved since onset. The problem is controlled. Associated symptoms include anxiety and malaise/fatigue. Pertinent negatives include no peripheral edema or shortness of breath. Risk factors for coronary artery disease include dyslipidemia, obesity and sedentary lifestyle. Past treatments include calcium channel blockers and ACE inhibitors. The current treatment provides significant improvement.  Gastroesophageal Reflux She complains of belching and heartburn. She reports no hoarse voice or no stridor. This is a chronic problem. The current episode started more than 1 year ago. The problem occurs occasionally. The symptoms are aggravated by certain foods. Associated symptoms include fatigue. She has tried a PPI for the symptoms. The treatment provided moderate relief.  Back Pain This is a chronic problem. The current episode started more than 1 year ago. The problem occurs intermittently. The problem is unchanged. The pain is present in the thoracic spine. The quality of the pain is described as aching. The pain is at a severity of 9/10. The pain is moderate. Risk factors include obesity. The treatment provided moderate relief.   Arthritis Presents for follow-up visit. She complains of pain and stiffness. Affected locations include the left knee, right knee, left MCP and right MCP. Her pain is at a severity of 2/10. Associated symptoms include fatigue.  Insomnia Primary symptoms: difficulty falling asleep, frequent awakening, malaise/fatigue.   The current episode started more than one year. The onset quality is gradual. The problem occurs intermittently. Past treatments include medication. The treatment provided moderate relief. PMH includes: depression.   Anxiety Presents for follow-up visit. Symptoms include depressed mood, excessive worry, insomnia, nervous/anxious behavior and restlessness. Patient reports no shortness of breath. Symptoms occur occasionally. The severity of symptoms is mild.    Depression        This is a chronic problem.  The current episode started more than 1 year ago.   The problem occurs intermittently.  Associated symptoms include fatigue, insomnia, restlessness and sad.  Associated symptoms include no helplessness and no hopelessness.  Past treatments include SSRIs - Selective serotonin reuptake inhibitors.  Past medical history includes anxiety.       Review of Systems  Constitutional:  Positive for fatigue and malaise/fatigue.  HENT:  Negative for hoarse voice.   Respiratory:  Negative for shortness of breath.   Gastrointestinal:  Positive for heartburn.  Musculoskeletal:  Positive for back pain and stiffness.  Psychiatric/Behavioral:  Positive for depression. The patient is nervous/anxious and has insomnia.   All other systems reviewed and are negative.  Family History  Problem Relation Age of Onset   Hypertension Mother    Lung cancer Father    Hypertension Brother  Colon cancer Neg Hx    Gastric cancer Neg Hx    Esophageal cancer Neg Hx    Social History   Socioeconomic History   Marital status: Single    Spouse name: Not on file   Number of children: 1   Years of  education: 12   Highest education level: Not on file  Occupational History   Occupation: Umemployed    Comment: Disability  Tobacco Use   Smoking status: Never   Smokeless tobacco: Never  Vaping Use   Vaping status: Never Used  Substance and Sexual Activity   Alcohol use: No   Drug use: No   Sexual activity: Not Currently    Birth control/protection: Surgical  Other Topics Concern   Not on file  Social History Narrative   Lives at home alone.   Disabled - speech difficulties, cannot drive   Right-handed.   Occasional caffeine use.   Her daughter lives about 30-45 minutes away   She has lots of family nearby - uncle, mother, sister, brother   Social Drivers of Corporate investment banker Strain: Low Risk  (12/27/2022)   Overall Financial Resource Strain (CARDIA)    Difficulty of Paying Living Expenses: Not hard at all  Food Insecurity: No Food Insecurity (12/27/2022)   Hunger Vital Sign    Worried About Running Out of Food in the Last Year: Never true    Ran Out of Food in the Last Year: Never true  Transportation Needs: No Transportation Needs (12/27/2022)   PRAPARE - Administrator, Civil Service (Medical): No    Lack of Transportation (Non-Medical): No  Physical Activity: Inactive (12/27/2022)   Exercise Vital Sign    Days of Exercise per Week: 0 days    Minutes of Exercise per Session: 0 min  Stress: No Stress Concern Present (12/27/2022)   [employer] of Occupational Health - Occupational Stress Questionnaire    Feeling of Stress : Not at all  Social Connections: Moderately Isolated (12/27/2022)   Social Connection and Isolation Panel [NHANES]    Frequency of Communication with Friends and Family: More than three times a week    Frequency of Social Gatherings with Friends and Family: More than three times a week    Attends Religious Services: More than 4 times per year    Active Member of [employer] or Organizations: No    Attends Banker  Meetings: Never    Marital Status: Never married       Objective:   Physical Exam Vitals reviewed.  Constitutional:      General: She is not in acute distress.    Appearance: She is well-developed.  HENT:     Head: Normocephalic and atraumatic.     Right Ear: Tympanic membrane normal.     Left Ear: Tympanic membrane normal.  Eyes:     Pupils: Pupils are equal, round, and reactive to light.  Neck:     Thyroid: No thyromegaly.  Cardiovascular:     Rate and Rhythm: Normal rate and regular rhythm.     Heart sounds: Normal heart sounds. No murmur heard. Pulmonary:     Effort: Pulmonary effort is normal. No respiratory distress.     Breath sounds: Normal breath sounds. No wheezing.  Abdominal:     General: Bowel sounds are normal. There is no distension.     Palpations: Abdomen is soft.     Tenderness: There is no abdominal tenderness.  Musculoskeletal:  General: Tenderness present. Normal range of motion.     Cervical back: Normal range of motion and neck supple.     Comments: Pain in thoracic area with flexion  Skin:    General: Skin is warm and dry.  Neurological:     Mental Status: She is alert and oriented to person, place, and time.     Cranial Nerves: No cranial nerve deficit.     Deep Tendon Reflexes: Reflexes are normal and symmetric.  Psychiatric:        Behavior: Behavior normal.        Thought Content: Thought content normal.        Judgment: Judgment normal.       BP 111/75   Pulse (!) 107   Temp (!) 97 F (36.1 C) (Temporal)   Ht 5\' 7"  (1.702 m)   Wt 200 lb 3.2 oz (90.8 kg)   LMP 09/16/2017   SpO2 95%   BMI 31.36 kg/m      Assessment & Plan:  Isla Sabree comes in today with chief complaint of Medical Management of Chronic Issues (6 mth )   Diagnosis and orders addressed:  1. HYPERTENSION, BENIGN - amLODipine (NORVASC) 10 MG tablet; Take 1 tablet (10 mg total) by mouth daily.  Dispense: 90 tablet; Refill: 0 - CMP14+EGFR  2.  Depression, major, single episode, moderate (HCC) - buPROPion (WELLBUTRIN XL) 150 MG 24 hr tablet; Take 1 tablet (150 mg total) by mouth daily.  Dispense: 30 tablet; Refill: 5 - escitalopram (LEXAPRO) 20 MG tablet; Take 1 tablet (20 mg total) by mouth daily.  Dispense: 180 tablet; Refill: 0 - CMP14+EGFR  3. GAD (generalized anxiety disorder) - buPROPion (WELLBUTRIN XL) 150 MG 24 hr tablet; Take 1 tablet (150 mg total) by mouth daily.  Dispense: 30 tablet; Refill: 5 - escitalopram (LEXAPRO) 20 MG tablet; Take 1 tablet (20 mg total) by mouth daily.  Dispense: 180 tablet; Refill: 0 - CMP14+EGFR  4. Chronic left-sided thoracic back pain - celecoxib (CELEBREX) 200 MG capsule; Take 1 capsule (200 mg total) by mouth 2 (two) times daily.  Dispense: 180 capsule; Refill: 1 - CMP14+EGFR  5. Primary osteoarthritis involving multiple joints - celecoxib (CELEBREX) 200 MG capsule; Take 1 capsule (200 mg total) by mouth 2 (two) times daily.  Dispense: 180 capsule; Refill: 1 - CMP14+EGFR  6. Gastroesophageal reflux disease without esophagitis (Primary) - CMP14+EGFR  7. Insomnia, unspecified type - CMP14+EGFR  8. Slurred speech - CMP14+EGFR  9. Vitamin D deficiency  - CMP14+EGFR   Labs pending Continue current medications  Health Maintenance reviewed Diet and exercise encouraged  Follow up plan: 6 months    Jannifer Rodney, FNP

## 2023-08-20 NOTE — Patient Instructions (Signed)

## 2023-08-21 LAB — CMP14+EGFR
ALT: 42 IU/L — ABNORMAL HIGH (ref 0–32)
AST: 25 IU/L (ref 0–40)
Albumin: 4.5 g/dL (ref 3.8–4.9)
Alkaline Phosphatase: 120 IU/L (ref 44–121)
BUN/Creatinine Ratio: 12 (ref 9–23)
BUN: 9 mg/dL (ref 6–24)
Bilirubin Total: 0.3 mg/dL (ref 0.0–1.2)
CO2: 26 mmol/L (ref 20–29)
Calcium: 10 mg/dL (ref 8.7–10.2)
Chloride: 104 mmol/L (ref 96–106)
Creatinine, Ser: 0.78 mg/dL (ref 0.57–1.00)
Globulin, Total: 2.8 g/dL (ref 1.5–4.5)
Glucose: 94 mg/dL (ref 70–99)
Potassium: 4 mmol/L (ref 3.5–5.2)
Sodium: 144 mmol/L (ref 134–144)
Total Protein: 7.3 g/dL (ref 6.0–8.5)
eGFR: 90 mL/min/{1.73_m2} (ref >59)

## 2023-09-04 ENCOUNTER — Telehealth: Payer: Self-pay

## 2023-09-04 NOTE — Telephone Encounter (Signed)
 Pt's daughter requesting Valium  for MRI. Advised it is controlled substance so pt would ntbs in office and they may be able to contact Ortho to get it prescribed since they ordered the MRI.

## 2023-09-04 NOTE — Telephone Encounter (Signed)
 Copied from CRM [id number]. Topic: Clinical - Medication Question >> Sep 04, 2023  2:24 PM Tiffany S wrote: Reason for CRM: Patient is need prescription in before MRI appt on Sat 4/26 please contact daughter Francina Irish  [phone]

## 2023-09-07 ENCOUNTER — Ambulatory Visit
Admission: RE | Admit: 2023-09-07 | Discharge: 2023-09-07 | Disposition: A | Discharge status: Home / Self Care | Source: Ambulatory Visit | Attending: Orthopaedic Surgery | Admitting: Orthopaedic Surgery

## 2023-09-07 ENCOUNTER — Other Ambulatory Visit: Payer: 59

## 2023-09-07 DIAGNOSIS — M5126 Other intervertebral disc displacement, lumbar region: Secondary | ICD-10-CM

## 2023-09-07 DIAGNOSIS — M5416 Radiculopathy, lumbar region: Secondary | ICD-10-CM

## 2023-09-16 ENCOUNTER — Ambulatory Visit (INDEPENDENT_AMBULATORY_CARE_PROVIDER_SITE_OTHER): Payer: 59 | Admitting: Orthopedic Surgery

## 2023-09-16 ENCOUNTER — Ambulatory Visit (INDEPENDENT_AMBULATORY_CARE_PROVIDER_SITE_OTHER)

## 2023-09-16 ENCOUNTER — Other Ambulatory Visit: Payer: Self-pay

## 2023-09-16 VITALS — BP 129/78 | HR 116 | Ht 67.0 in | Wt 198.0 lb

## 2023-09-16 DIAGNOSIS — M545 Low back pain, unspecified: Secondary | ICD-10-CM | POA: Diagnosis not present

## 2023-09-16 DIAGNOSIS — G8929 Other chronic pain: Secondary | ICD-10-CM

## 2023-09-16 MED ORDER — METHYLPREDNISOLONE 4 MG PO TBPK: ORAL_TABLET | ORAL | 0 refills | Status: DC

## 2023-09-16 NOTE — Progress Notes (Signed)
 Orthopedic Spine Surgery Office Note  Assessment: Patient is a 53 y.o. female with low back pain that radiates into her left anterior thigh and leg, suspect radiculopathy   Plan: -Explained that initially conservative treatment is tried as a significant number of patients may experience relief with these treatment modalities. Discussed that the conservative treatments include:  -activity modification  -physical therapy  -over the counter pain medications  -medrol  dosepak  -lumbar steroid injections -Patient has tried PT, tylenol , ESI, tramadol   -The majority of her pain is coming from her back. She does have some facet arthropathy so I recommended facet injections to help with her back pain since that is her larger complaint -If she does not get any relief with that, would talk about surgery as an option -Patient should return to office in 4 weeks, x-rays at next visit: none   Patient expressed understanding of the plan and all questions were answered to the patient's satisfaction.   ___________________________________________________________________________   History:  Patient is a 53 y.o. female who presents today for lumbar spine. Patient has had pain in her low back for a little over one year. It has gotten progressively worse over time. In the last three months, she has developed pain radiating into her left leg. She feels it going into the left anterior thigh and leg. It does not radiate past the mid tibia. No pain radiating into the right lower extremity. Has not noticed any weakness. There was no trauma or injury that preceded the onset of her pain.    Weakness: denies Symptoms of imbalance: sometimes feels off balance due to her vertigo, no new symptoms or unsteadiness Paresthesias and numbness: denies Bowel or bladder incontinence: denies Saddle anesthesia: denies  Treatments tried: PT, tylenol , ESI, tramadol   Review of systems: Denies fevers and chills, night sweats,  unexplained weight loss, history of cancer. Has had pain that wakes her at night  Past medical history: COPD GERD HTN Migraines Vertigo  Allergies: venofer   Past surgical history:  C4-6 ACDF Bilateral knee arthroscopy Thyroid  surgery Cholecystectomy  Social history: Denies use of nicotine product (smoking, vaping, patches, smokeless) Alcohol use: denies Denies recreational drug use   Physical Exam:  BMI of 31.0  General: no acute distress, appears stated age Neurologic: alert, answering questions appropriately, following commands Respiratory: unlabored breathing on room air, symmetric chest rise Psychiatric: appropriate affect, normal cadence to speech   MSK (spine):  -Strength exam      Left  Right EHL    5/5  5/5 TA    5/5  5/5 GSC    5/5  5/5 Knee extension  5/5  5/5 Hip flexion   5/5  5/5  -Sensory exam    Sensation intact to light touch in L3-S1 nerve distributions of bilateral lower extremities  -Achilles DTR: 2/4 on the left, 2/4 on the right -Patellar tendon DTR: 2/4 on the left, 2/4 on the right  -Straight leg raise: negative bilaterally  -Femoral nerve stretch test: negative bilaterally -Clonus: no beats bilaterally  -Left hip exam: no pain through range of motion, negative stinchfield, negative FABER -Right hip exam: no pain through range of motion, negative stinchfield, negative FABER  Imaging: XRs of the lumbar spine from 09/16/2023 were independently reviewed and interpreted, showing no significant degenerative changes.  No fracture or dislocation seen.  No evidence of instability on flexion/extension views.  Lower thoracic curvature that measures 17 degrees with apex to the right, lumbar curvature with apex to the left that measures 10  degrees.  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 Disc herniation appears smaller when compared to  prior MRI from 11/17/2022. No other significant stenosis seen.    Patient name: Brittany Durham Patient MRN: [id number] Date of visit: 09/16/23

## 2023-09-23 ENCOUNTER — Telehealth: Payer: Self-pay | Admitting: Orthopedic Surgery

## 2023-09-23 NOTE — Telephone Encounter (Signed)
 Lashonda called. She is the daughter. Mom is having an allergic reaction to the medrol  dosepak

## 2023-09-23 NOTE — Telephone Encounter (Signed)
 I called and spoke with Brittany Durham, she states that Brittany Durham told her that she was itching and that she took a benadryl . Brittany Durham was unsure of how many days she has taken, but does say she has gotten 75% relief with it.  I spoke with Brittany Durham, he advised that since she has got so much relief so far that she should continue taking the medrol  dose pak and take the benadryl  with it for the itching. I called and advised Brittany Durham of this and she understands this.

## 2023-10-01 ENCOUNTER — Other Ambulatory Visit: Payer: Self-pay

## 2023-10-01 ENCOUNTER — Emergency Department (HOSPITAL_COMMUNITY)

## 2023-10-01 ENCOUNTER — Encounter (HOSPITAL_COMMUNITY): Payer: Self-pay | Admitting: *Deleted

## 2023-10-01 ENCOUNTER — Emergency Department (HOSPITAL_COMMUNITY)
Admission: EM | Admit: 2023-10-01 | Discharge: 2023-10-01 | Disposition: A | Discharge status: Home / Self Care | Attending: Emergency Medicine | Admitting: Emergency Medicine

## 2023-10-01 DIAGNOSIS — R0789 Other chest pain: Secondary | ICD-10-CM | POA: Diagnosis not present

## 2023-10-01 DIAGNOSIS — I10 Essential (primary) hypertension: Secondary | ICD-10-CM | POA: Insufficient documentation

## 2023-10-01 DIAGNOSIS — R0781 Pleurodynia: Secondary | ICD-10-CM | POA: Diagnosis not present

## 2023-10-01 DIAGNOSIS — K449 Diaphragmatic hernia without obstruction or gangrene: Secondary | ICD-10-CM | POA: Diagnosis not present

## 2023-10-01 DIAGNOSIS — I499 Cardiac arrhythmia, unspecified: Secondary | ICD-10-CM | POA: Diagnosis not present

## 2023-10-01 DIAGNOSIS — I700 Atherosclerosis of aorta: Secondary | ICD-10-CM | POA: Diagnosis not present

## 2023-10-01 DIAGNOSIS — J449 Chronic obstructive pulmonary disease, unspecified: Secondary | ICD-10-CM | POA: Insufficient documentation

## 2023-10-01 DIAGNOSIS — R079 Chest pain, unspecified: Secondary | ICD-10-CM | POA: Diagnosis not present

## 2023-10-01 DIAGNOSIS — J9811 Atelectasis: Secondary | ICD-10-CM | POA: Diagnosis not present

## 2023-10-01 LAB — D-DIMER, QUANTITATIVE: D-Dimer, Quant: 3.95 ug{FEU}/mL — ABNORMAL HIGH (ref 0.00–0.50)

## 2023-10-01 LAB — CBC
HCT: 39.4 % (ref 36.0–46.0)
Hemoglobin: 12.9 g/dL (ref 12.0–15.0)
MCH: 29.3 pg (ref 26.0–34.0)
MCHC: 32.7 g/dL (ref 30.0–36.0)
MCV: 89.5 fL (ref 80.0–100.0)
Platelets: 214 10*3/uL (ref 150–400)
RBC: 4.4 MIL/uL (ref 3.87–5.11)
RDW: 13.1 % (ref 11.5–15.5)
WBC: 6.5 10*3/uL (ref 4.0–10.5)
nRBC: 0 % (ref 0.0–0.2)

## 2023-10-01 LAB — URINALYSIS, ROUTINE W REFLEX MICROSCOPIC
Bilirubin Urine: NEGATIVE
Glucose, UA: NEGATIVE mg/dL
Hgb urine dipstick: NEGATIVE
Ketones, ur: NEGATIVE mg/dL
Nitrite: NEGATIVE
Protein, ur: 100 mg/dL — AB
Specific Gravity, Urine: 1.03 (ref 1.005–1.030)
pH: 5 (ref 5.0–8.0)

## 2023-10-01 LAB — BASIC METABOLIC PANEL WITH GFR
Anion gap: 11 (ref 5–15)
BUN: 14 mg/dL (ref 6–20)
CO2: 25 mmol/L (ref 22–32)
Calcium: 10.4 mg/dL — ABNORMAL HIGH (ref 8.9–10.3)
Chloride: 100 mmol/L (ref 98–111)
Creatinine, Ser: 0.75 mg/dL (ref 0.44–1.00)
GFR, Estimated: >60 mL/min (ref >60)
Glucose, Bld: 98 mg/dL (ref 70–99)
Potassium: 3.6 mmol/L (ref 3.5–5.1)
Sodium: 136 mmol/L (ref 135–145)

## 2023-10-01 LAB — TROPONIN I (HIGH SENSITIVITY)
Troponin I (High Sensitivity): 6 ng/L (ref <18)
Troponin I (High Sensitivity): 7 ng/L (ref <18)

## 2023-10-01 MED ORDER — IOHEXOL 350 MG/ML SOLN
75.0000 mL | Freq: Once | INTRAVENOUS | Status: AC | PRN
  Administered 2023-10-01: 75 mL via INTRAVENOUS

## 2023-10-01 MED ORDER — OXYCODONE HCL 5 MG PO TABS: 5.0000 mg | ORAL_TABLET | Freq: Four times a day (QID) | ORAL | 0 refills | Status: DC | PRN

## 2023-10-01 MED ORDER — HYDROMORPHONE HCL 1 MG/ML IJ SOLN
0.5000 mg | Freq: Once | INTRAMUSCULAR | Status: AC
  Administered 2023-10-01: 0.5 mg via INTRAVENOUS
  Filled 2023-10-01: qty 0.5

## 2023-10-01 MED ORDER — KETOROLAC TROMETHAMINE 15 MG/ML IJ SOLN
15.0000 mg | Freq: Once | INTRAMUSCULAR | Status: AC
  Administered 2023-10-01: 15 mg via INTRAVENOUS
  Filled 2023-10-01: qty 1

## 2023-10-01 NOTE — ED Notes (Signed)
 See triage notes. Pt a/o. Nad. Will have sudden slight jumps due to the sharp pains that suddenly come and quickly go to mid chest. Denies radiation. Non diaphoretic.

## 2023-10-01 NOTE — ED Triage Notes (Signed)
 Pt BIB [facility] for CP in the middle since yesterday morning. ST per EMS. 324 mg ASA given en route, 18 G IV started to left AC. Pain worse with deep breath.

## 2023-10-01 NOTE — ED Provider Notes (Signed)
 [place] EMERGENCY DEPARTMENT AT [facility] Provider Note  CSN: [id number] Arrival date & time: 10/01/23 1216  Chief Complaint(s) Chest Pain  HPI Brittany Durham is a 53 y.o. female history of COPD, GERD, hypertension presenting to the emergency department with chest pain.  Patient reports chest pain since yesterday.  Reports that it is sharp, worse with deep breath, also with movement.  No injury to the chest.  Not really feeling shortness of breath.  No nausea or vomiting.  No abdominal pain or back pain.  No pain in the arms or legs.  No lightheadedness or dizziness, fainting.  No recent travel or surgery.  Reports that she had a similar episode a few years ago, was told it was chest wall pain.   Past Medical History Past Medical History:  Diagnosis Date   Anemia    Asthma    childhood   Carotidynia    COPD (chronic obstructive pulmonary disease) (HCC)    DDD (degenerative disc disease), cervical    GERD (gastroesophageal reflux disease)    Hypertension    Knee pain    left meniscus tear s/p repair 11/2014   Seizures (HCC)    last seizure was over 10 years ago, unknown etiology   Stuttering    patient reports this started after her knee surgery in 11/2014.    Thyroid  nodule    Patient Active Problem List   Diagnosis Date Noted   Protrusion of lumbar intervertebral disc 11/29/2022   Biliary dyskinesia 02/01/2021   Right flank pain 10/27/2020   Iron  deficiency anemia due to chronic blood loss 09/15/2020   Symptomatic anemia 07/20/2020   Depression, major, single episode, moderate (HCC) 07/15/2020   Vertigo 01/26/2020   GAD (generalized anxiety disorder) 07/28/2019   S/P cervical spinal fusion 03/20/2019   Osteoarthritis 11/18/2017   Insomnia 11/18/2017   Synovitis, villonodular, knee, right 10/28/2017   Tear of lateral meniscus of right knee, current 10/28/2017   Chondromalacia patellae, 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 Authorizing Provider  acetaminophen  (TYLENOL ) 500 MG tablet Take 1,000 mg by mouth every 6 (six) hours as needed for mild pain (pain score 1-3).   Yes [provider]  buPROPion  (WELLBUTRIN  XL) 150 MG 24 hr tablet Take 1 tablet (150 mg total) by mouth daily. 08/20/23  Yes Hawks, Christy A, FNP  busPIRone  (BUSPAR ) 5 MG tablet TAKE ONE TABLET UP TO THREE TIMES DAILY 08/19/23  Yes Hawks, Christy A, FNP  escitalopram  (LEXAPRO ) 20 MG tablet Take 1 tablet (20 mg total) by mouth daily. 08/20/23  Yes Hawks, Christy A, FNP  oxyCODONE  (ROXICODONE ) 5 MG immediate release tablet Take 1 tablet (5 mg total) by mouth every 6 (six) hours as needed for severe pain (pain score 7-10). 10/01/23  Yes Mordecai Applebaum, MD  pantoprazole  (PROTONIX ) 40 MG tablet TAKE ONE TABLET EVERY DAY BEFORE BREAKFAST 07/09/23  Yes Yevette Hem, FNP  Past Surgical History Past Surgical History:  Procedure Laterality Date   ANTERIOR CERVICAL DECOMP/DISCECTOMY FUSION N/A 03/20/2019   Procedure: Anterior Cervical Decompression Fusion - Cervical Four-Cervical Five - Cervical Five-Cervical Six;  Surgeon: Isadora Mar, MD;  Location: [facility] OR;  Service: Neurosurgery;  Laterality: N/A;  Anterior Cervical Decompression Fusion - Cervical Four-Cervical Five - Cervical Five-Cervical Six   BIOPSY  09/20/2020   Procedure: BIOPSY;  Surgeon: Suzette Espy, MD;  Location: [facility]  ENDO SUITE;  Service: Endoscopy;;   COLONOSCOPY N/A 08/23/2016   Procedure: COLONOSCOPY;  Surgeon: Suzette Espy, MD;  Location: [facility] ENDO SUITE;  Service: Endoscopy;  Laterality: N/A;  1:00pm   COLONOSCOPY N/A 08/24/2020   Diverticulosis in the entire examined colon. Redundant colon. Exam otherwise normal   ESOPHAGOGASTRODUODENOSCOPY N/A 05/03/2016   Procedure: ESOPHAGOGASTRODUODENOSCOPY (EGD);  Surgeon: Suzette Espy, MD;  Location: [facility] SUITE;  Service: Endoscopy;  Laterality: N/A;   ESOPHAGOGASTRODUODENOSCOPY N/A 08/23/2016   Procedure: ESOPHAGOGASTRODUODENOSCOPY (EGD);  Surgeon: Suzette Espy, MD;  Location: [facility];  Service: Endoscopy;  Laterality: N/A;   ESOPHAGOGASTRODUODENOSCOPY (EGD) WITH PROPOFOL  N/A 09/20/2020   normal esophagus. Large hiatal hernia. Multiple gastric erosions (Cameron lesions). Normal duodenal bulb and second portion of the duodenum. Suspicion Donelda Fujita lesions causing intermittent bleeding   KNEE ARTHROSCOPY Right    KNEE ARTHROSCOPY WITH MEDIAL MENISECTOMY Left 12/09/2014   Procedure: KNEE ARTHROSCOPY WITH PARTIAL MEDIAL AND LATERAL MENISECTOMY;  Surgeon: Pleasant Brilliant, MD;  Location: [facility] ORS;  Service: Orthopedics;  Laterality: Left;   TUBAL LIGATION     WISDOM TOOTH EXTRACTION     Family History Family History  Problem Relation Age of Onset   Hypertension Mother    Lung cancer Father    Hypertension Brother    Colon cancer Neg Hx    Gastric cancer Neg Hx    Esophageal cancer Neg Hx     Social History Social History   Tobacco Use   Smoking status: Never   Smokeless tobacco: Never  Vaping Use   Vaping status: Never Used  Substance Use Topics   Alcohol use: No   Drug use: No   Allergies Methylprednisolone  and Venofer  [iron  sucrose]  Review of Systems Review of Systems  All other systems reviewed and are negative.   Physical Exam Vital Signs  I have reviewed the triage vital signs BP 119/89   Pulse 96   Temp 98.7 F (37.1 C)  (Oral)   Resp (!) 23   Ht 5\' 7"  (1.702 m)   Wt 89.4 kg   LMP 09/16/2017   SpO2 94%   BMI 30.85 kg/m  Physical Exam Vitals and nursing note reviewed.  Constitutional:      General: She is not in acute distress.    Appearance: She is well-developed.  HENT:     Head: Normocephalic and atraumatic.     Mouth/Throat:     Mouth: Mucous membranes are moist.  Eyes:     Pupils: Pupils are equal, round, and reactive to light.  Cardiovascular:     Rate and Rhythm: Normal rate and regular rhythm.     Heart sounds: No murmur heard. Pulmonary:     Effort: Pulmonary effort is normal. No respiratory distress.     Breath sounds: Normal breath sounds.  Chest:     Chest wall: Tenderness (tenderness around sternum anteriorly bilaterally) present.  Abdominal:     General: Abdomen is flat.     Palpations: Abdomen is soft.  Tenderness: There is no abdominal tenderness.  Musculoskeletal:        General: No tenderness.     Right lower leg: No edema.     Left lower leg: No edema.  Skin:    General: Skin is warm and dry.  Neurological:     General: No focal deficit present.     Mental Status: She is alert. Mental status is at baseline.  Psychiatric:        Mood and Affect: Mood normal.        Behavior: Behavior normal.     ED Results and Treatments Labs (all labs ordered are listed, but only abnormal results are displayed) Labs Reviewed  BASIC METABOLIC PANEL WITH GFR - Abnormal; Notable for the following components:      Result Value   Calcium 10.4 (*)    All other components within normal limits  URINALYSIS, ROUTINE W REFLEX MICROSCOPIC - Abnormal; Notable for the following components:   APPearance CLOUDY (*)    Protein, ur 100 (*)    Leukocytes,Ua MODERATE (*)    Bacteria, UA RARE (*)    All other components within normal limits  D-DIMER, QUANTITATIVE - Abnormal; Notable for the following components:   D-Dimer, Quant 3.95 (*)    All other components within normal limits  CBC   TROPONIN I (HIGH SENSITIVITY)  TROPONIN I (HIGH SENSITIVITY)                                                                                                                          Radiology CT Angio Chest PE W and/or Wo Contrast Result Date: 10/01/2023 CLINICAL DATA:  One day history of pleuritic chest pain EXAM: CT ANGIOGRAPHY CHEST WITH CONTRAST TECHNIQUE: Multidetector CT imaging of the chest was performed using the standard protocol during bolus administration of intravenous contrast. Multiplanar CT image reconstructions and MIPs were obtained to evaluate the vascular anatomy. RADIATION DOSE REDUCTION: This exam was performed according to the departmental dose-optimization program which includes automated exposure control, adjustment of the mA and/or kV according to patient size and/or use of iterative reconstruction technique. CONTRAST:  75mL OMNIPAQUE  IOHEXOL  350 MG/ML SOLN COMPARISON:  Same day chest radiograph, CTA chest dated 01/31/2010 FINDINGS: Cardiovascular: The study is adequate for the evaluation of pulmonary embolism in the setting of motion artifact. There are no filling defects in the central, lobar, segmental or subsegmental pulmonary artery branches to suggest acute pulmonary embolism. Great vessels are normal in course and caliber. Normal heart size. No significant pericardial fluid/thickening. Aortic atherosclerosis. Mediastinum/Nodes: Mildly heterogeneous appearance of the thyroid  gland. Moderate hiatal hernia. No pathologically enlarged axillary, supraclavicular, mediastinal, or hilar lymph nodes. Lungs/Pleura: The central airways are patent. Mild compressive atelectasis of the left lower lobe. No pneumothorax. No pleural effusion. Upper abdomen: Cholecystectomy. Musculoskeletal: No acute or abnormal lytic or blastic osseous lesions. Review of the MIP images confirms the above findings. IMPRESSION: 1. No evidence of pulmonary embolism. 2. Moderate hiatal hernia. 3.  Aortic  Atherosclerosis (ICD10-I70.0). Electronically Signed   By: Limin  Xu M.D.   On: 10/01/2023 18:33   DG Chest 2 View Result Date: 10/01/2023 CLINICAL DATA:  Chest pain. EXAM: CHEST - 2 VIEW COMPARISON:  January 31, 2019. FINDINGS: The heart size and mediastinal contours are within normal limits. Small hiatal hernia. Both lungs are clear. The visualized skeletal structures are unremarkable. IMPRESSION: No active cardiopulmonary disease.  Small hiatal hernia. Electronically Signed   By: Rosalene Colon M.D.   On: 10/01/2023 15:20    Pertinent labs & imaging results that were available during my care of the patient were reviewed by me and considered in my medical decision making (see MDM for details).  Medications Ordered in ED Medications  ketorolac  (TORADOL ) 15 MG/ML injection 15 mg (15 mg Intravenous Given 10/01/23 1714)  iohexol  (OMNIPAQUE ) 350 MG/ML injection 75 mL (75 mLs Intravenous Contrast Given 10/01/23 1747)  HYDROmorphone  (DILAUDID ) injection 0.5 mg (0.5 mg Intravenous Given 10/01/23 1933)                                                                                                                                     Procedures Procedures  (including critical care time)  Medical Decision Making / ED Course   MDM:  53 year old presenting to the emergency department with chest pain.  Patient overall well-appearing, physical exam with reproducible chest wall tenderness.  Vitals with mild tachycardia.  No hypoxia.  Lungs clear.  Suspect most likely causes chest wall pain given tenderness that is reproducible.  Considered pulmonary embolism given pleuritic nature, obtain D-dimer given mild tachycardia, was elevated so CTA was obtained.  Does not show any signs of any acute pulmonary embolism or other vascular process e.g dissection.  Troponin negative x 2.  ECG without findings concerning for acute ACS and with negative troponin doubt this.  Considered other cause such as pericarditis  but symptoms seem atypical for this.  No pneumothorax, pneumonia on imaging and symptoms atypical for these.  Symptoms did improve in the emergency department with Toradol .  Patient reports she is not supposed to take chronic NSAID due to prior bleeding so recommended Tylenol  outpatient, small amount of oxycodone  prescribed, discussed safe use of this medication including avoiding driving or operating machinery.  Patient reports she does have follow-up with cardiology in outpatient setting.  Will discharge patient to home. All questions answered. Patient comfortable with plan of discharge. Return precautions discussed with patient and specified on the after visit summary.       Additional history obtained: -Additional history obtained from family -External records from outside source obtained and reviewed including: Chart review including previous notes, labs, imaging, consultation notes including prior cardiac testing   Lab Tests: -I ordered, reviewed, and interpreted labs.   The pertinent results include:   Labs Reviewed  BASIC METABOLIC PANEL WITH GFR - Abnormal; Notable for the following components:  Result Value   Calcium 10.4 (*)    All other components within normal limits  URINALYSIS, ROUTINE W REFLEX MICROSCOPIC - Abnormal; Notable for the following components:   APPearance CLOUDY (*)    Protein, ur 100 (*)    Leukocytes,Ua MODERATE (*)    Bacteria, UA RARE (*)    All other components within normal limits  D-DIMER, QUANTITATIVE - Abnormal; Notable for the following components:   D-Dimer, Quant 3.95 (*)    All other components within normal limits  CBC  TROPONIN I (HIGH SENSITIVITY)  TROPONIN I (HIGH SENSITIVITY)    Notable for normal troponin, elevated d-dimer  EKG   EKG Interpretation Date/Time:  Tuesday Oct 01 2023 12:34:15 EDT Ventricular Rate:  106 PR Interval:  132 QRS Duration:  84 QT Interval:  332 QTC Calculation: 441 R Axis:   42  Text  Interpretation: Sinus tachycardia Otherwise normal ECG When compared with ECG of 21-Apr-2022 19:55, No significant change was found Confirmed by Hiawatha Lout ([id number]) on 10/01/2023 4:12:49 PM         Imaging Studies ordered: I ordered imaging studies including CTA chest On my interpretation imaging demonstrates no PE I independently visualized and interpreted imaging. I agree with the radiologist interpretation   Medicines ordered and prescription drug management: Meds ordered this encounter  Medications   ketorolac  (TORADOL ) 15 MG/ML injection 15 mg   iohexol  (OMNIPAQUE ) 350 MG/ML injection 75 mL   HYDROmorphone  (DILAUDID ) injection 0.5 mg   oxyCODONE  (ROXICODONE ) 5 MG immediate release tablet    Sig: Take 1 tablet (5 mg total) by mouth every 6 (six) hours as needed for severe pain (pain score 7-10).    Dispense:  10 tablet    Refill:  0    -I have reviewed the patients home medicines and have made adjustments as needed  Cardiac Monitoring: The patient was maintained on a cardiac monitor.  I personally viewed and interpreted the cardiac monitored which showed an underlying rhythm of: sinus rhythm   Social Determinants of Health:  Diagnosis or treatment significantly limited by social determinants of health: obesity   Reevaluation: After the interventions noted above, I reevaluated the patient and found that their symptoms have improved  Co morbidities that complicate the patient evaluation  Past Medical History:  Diagnosis Date   Anemia    Asthma    childhood   Carotidynia    COPD (chronic obstructive pulmonary disease) (HCC)    DDD (degenerative disc disease), cervical    GERD (gastroesophageal reflux disease)    Hypertension    Knee pain    left meniscus tear s/p repair 11/2014   Seizures (HCC)    last seizure was over 10 years ago, unknown etiology   Stuttering    patient reports this started after her knee surgery in 11/2014.    Thyroid  nodule        Dispostion: Disposition decision including need for hospitalization was considered, and patient discharged from emergency department.    Final Clinical Impression(s) / ED Diagnoses Final diagnoses:  Atypical chest pain     This chart was dictated using voice recognition software.  Despite best efforts to proofread,  errors can occur which can change the documentation meaning.    Mordecai Applebaum, MD 10/01/23 [phone]

## 2023-10-01 NOTE — Discharge Instructions (Addendum)
 We evaluated you for your chest pain.  Your testing in the emergency department is reassuring.  We did not see any signs of any dangerous cause of your pain such as a heart attack or blood clot in your lungs.    We believe that it is safe to go home and follow-up with your cardiologist as an outpatient.  Please take at 1000 mg of Tylenol  every 6 hours as needed for pain.  We have prescribed a small amount of oxycodone  to take for pain unrelieved by Tylenol , but please also try over-the-counter lidocaine  patches and ice before using oxycodone .  Oxycodone  can make you drowsy, do not drink alcohol when you are taking this medicine, do not drive or operate motor vehicle when taking this medicine.  If you develop any new or worsening symptoms such as severe or worsening pain, uncontrolled vomiting, difficulty breathing, lightheadedness or fainting, or any other concerning symptoms, please return to the emergency department.

## 2023-10-02 ENCOUNTER — Inpatient Hospital Stay: Payer: 59 | Attending: Hematology

## 2023-10-02 DIAGNOSIS — Z79899 Other long term (current) drug therapy: Secondary | ICD-10-CM | POA: Insufficient documentation

## 2023-10-02 DIAGNOSIS — D500 Iron deficiency anemia secondary to blood loss (chronic): Secondary | ICD-10-CM

## 2023-10-02 DIAGNOSIS — D509 Iron deficiency anemia, unspecified: Secondary | ICD-10-CM | POA: Insufficient documentation

## 2023-10-02 DIAGNOSIS — Z801 Family history of malignant neoplasm of trachea, bronchus and lung: Secondary | ICD-10-CM | POA: Diagnosis not present

## 2023-10-02 LAB — FERRITIN: Ferritin: 56 ng/mL (ref 11–307)

## 2023-10-02 LAB — CBC WITH DIFFERENTIAL/PLATELET
Abs Immature Granulocytes: 0.03 10*3/uL (ref 0.00–0.07)
Basophils Absolute: 0 10*3/uL (ref 0.0–0.1)
Basophils Relative: 0 %
Eosinophils Absolute: 0.3 10*3/uL (ref 0.0–0.5)
Eosinophils Relative: 4 %
HCT: 39.6 % (ref 36.0–46.0)
Hemoglobin: 12.8 g/dL (ref 12.0–15.0)
Immature Granulocytes: 1 %
Lymphocytes Relative: 22 %
Lymphs Abs: 1.5 10*3/uL (ref 0.7–4.0)
MCH: 29.4 pg (ref 26.0–34.0)
MCHC: 32.3 g/dL (ref 30.0–36.0)
MCV: 90.8 fL (ref 80.0–100.0)
Monocytes Absolute: 0.6 10*3/uL (ref 0.1–1.0)
Monocytes Relative: 9 %
Neutro Abs: 4.3 10*3/uL (ref 1.7–7.7)
Neutrophils Relative %: 64 %
Platelets: 197 10*3/uL (ref 150–400)
RBC: 4.36 MIL/uL (ref 3.87–5.11)
RDW: 13.1 % (ref 11.5–15.5)
WBC: 6.6 10*3/uL (ref 4.0–10.5)
nRBC: 0 % (ref 0.0–0.2)

## 2023-10-02 LAB — IRON AND TIBC
Iron: 68 ug/dL (ref 28–170)
Saturation Ratios: 17 % (ref 10.4–31.8)
TIBC: 390 ug/dL (ref 250–450)
UIBC: 322 ug/dL

## 2023-10-08 ENCOUNTER — Ambulatory Visit (HOSPITAL_BASED_OUTPATIENT_CLINIC_OR_DEPARTMENT_OTHER): Payer: 59 | Admitting: Cardiology

## 2023-10-08 ENCOUNTER — Other Ambulatory Visit: Payer: Self-pay | Admitting: Family

## 2023-10-08 DIAGNOSIS — F411 Generalized anxiety disorder: Secondary | ICD-10-CM

## 2023-10-08 DIAGNOSIS — K279 Peptic ulcer, site unspecified, unspecified as acute or chronic, without hemorrhage or perforation: Secondary | ICD-10-CM

## 2023-10-08 NOTE — Progress Notes (Unsigned)
 [facility] [address], [place] [address]   CLINIC:  Medical Oncology/Hematology  PCP:  Brittany Hem, FNP [address] [place] [address] [phone]    REASON FOR VISIT:  Follow-up for iron  deficiency anemia   CURRENT THERAPY: Intermittent IV iron   INTERVAL HISTORY:   Brittany Durham 53 y.o. female returns for routine follow-up of her iron  deficiency anemia.  She was last seen by Brittany Durham on 07/09/2023.     At today's visit, she reports feeling poorly due to back pain.  She received IV Ferrlecit 250 mg x 4 in March 2025, which she tolerated well.  She felt a transient increase in energy after IV iron .  She had recurrent fatigue after a few weeks. She had an episode of chest pain last week, with negative workup per ED visit on 10/01/2023.  She is scheduled for outpatient cardiology follow-up. She tires easily and has some mild dyspnea on exertion and palpitations. She denies any pica, lightheadedness, or syncope.   She has not had any hematochezia or melena for about 2 months.  She has 25% energy and 50% appetite. She endorses that she is maintaining a stable weight.  ASSESSMENT & PLAN:  1.  Iron  deficiency anemia with suspected occult Brittany bleed - ED on 07/18/2020 with Hgb 5.7, fatigue, lightheadedness.  Denied overt Brittany bleeding and was Hemoccult-negative, but does have a history of peptic ulcer disease.  She was transfused 2 units PRBCs on 07/18/2020, and followed up with gastroenterology.  - Colonoscopy (08/24/2020) showed diverticulosis, but was otherwise normal. - EGD (09/20/2020): Large hiatal hernia, Cameron lesions and antral erosions (suspicious for causing intermittent bleeding in the setting of ongoing NSAID use per Brittany note) - EGD and colonoscopy at [facility] on 03/30/2022, which showed multiple scattered pancolonic diverticula of moderate severity otherwise normal colonoscopy; EGD was normal  - She is on Protonix   for peptic ulcer disease - Hematology work-up (07/29/2020) showed Hgb 8.5 (microcytic with MCV 78.2).  Ferritin was 7, with 32% iron  saturation.  Reticulocytes 3.4 (reticulocyte index 1.61, consistent with hypoproliferation in the setting of iron  deficiency).   - Most recent IV iron  with Ferrlecit 250 mg x 4 in March 2025 (unable to use Feraheme  due to upcoming MRI). - Per outside medical records, patient had mild postinfusion reaction with rash on bilateral arms after Venofer  infusion on 09/29/2021.  Tolerated Feraheme  well with PREMEDICATION (including IV cetirizine , IV Solu-Medrol , IV famotidine , p.o. Tylenol ). - Intermittent rectal bleeding.  No recent signs of melena. - Follows with Brittany Durham & Dr. Elayne Durham)  - Symptomatic with fatigue - Most recent labs (10/02/2023): Hgb 12.8/MCV 90.8.  Ferritin 56, iron  saturation 17%, normal TIBC. Labs from October 2023 showed CMP unremarkable, LDH normal; normal B12, MMA, folate, SPEP, haptoglobin. - PLAN: IV Feraheme  x 1 - Continue Brittany follow-up.  - RTC in 3 months for repeat CBC and iron  panel followed by office visit  PLAN SUMMARY:  >> IV Feraheme  x 1 >> Labs in 3 months = CBC/D, ferritin, iron /TIBC >> OFFICE visit in 3 months    REVIEW OF SYSTEMS:  Review of Systems  Constitutional:  Positive for fatigue. Negative for appetite change, chills, diaphoresis, fever and unexpected weight change.  HENT:   Negative for lump/mass and nosebleeds.   Eyes:  Negative for eye problems.  Respiratory:  Negative for cough, hemoptysis and shortness of breath.   Cardiovascular:  Positive for chest  pain (none today). Negative for leg swelling and palpitations.  Gastrointestinal:  Negative for abdominal pain, blood in stool, constipation, diarrhea, nausea and vomiting.  Genitourinary:  Negative for hematuria.   Musculoskeletal:  Positive for back pain.  Skin: Negative.   Neurological:  Negative for dizziness, headaches,  light-headedness and numbness.  Hematological:  Does not bruise/bleed easily.  Psychiatric/Behavioral:  Positive for sleep disturbance. Negative for depression. The patient is not nervous/anxious.      PHYSICAL EXAM:  ECOG PERFORMANCE STATUS: 1 - Symptomatic but completely ambulatory  Vitals:   10/09/23 0913  BP: 133/83  Pulse: 96  Resp: 18  Temp: (!) 97.1 F (36.2 C)  SpO2: 98%    Filed Weights   10/09/23 0913  Weight: 200 lb (90.7 kg)    Physical Exam Constitutional:      Appearance: Normal appearance. She is obese.  Cardiovascular:     Rate and Rhythm: Tachycardia present.     Heart sounds: Normal heart sounds.     Comments: HR 96 Pulmonary:     Breath sounds: Normal breath sounds.  Neurological:     General: No focal deficit present.     Mental Status: Mental status is at baseline.  Psychiatric:        Behavior: Behavior normal. Behavior is cooperative.     PAST MEDICAL/SURGICAL HISTORY:  Past Medical History:  Diagnosis Date   Anemia    Asthma    childhood   Carotidynia    COPD (chronic obstructive pulmonary disease) (HCC)    DDD (degenerative disc disease), cervical    GERD (gastroesophageal reflux disease)    Hypertension    Knee pain    left meniscus tear s/p repair 11/2014   Seizures (HCC)    last seizure was over 10 years ago, unknown etiology   Stuttering    patient reports this started after her knee surgery in 11/2014.    Thyroid  nodule    Past Surgical History:  Procedure Laterality Date   ANTERIOR CERVICAL DECOMP/DISCECTOMY FUSION N/A 03/20/2019   Procedure: Anterior Cervical Decompression Fusion - Cervical Four-Cervical Five - Cervical Five-Cervical Six;  Surgeon: Isadora Mar, MD;  [facility];  Service: Neurosurgery;  Laterality: N/A;  Anterior Cervical Decompression Fusion - Cervical Four-Cervical Five - Cervical Five-Cervical Six   BIOPSY  09/20/2020   Procedure: BIOPSY;  Surgeon: Suzette Espy, MD;  Location: [facility];   Service: Endoscopy;;   COLONOSCOPY N/A 08/23/2016   Procedure: COLONOSCOPY;  Surgeon: Suzette Espy, MD;  Location: [facility];  Service: Endoscopy;  Laterality: N/A;  1:00pm   COLONOSCOPY N/A 08/24/2020   Diverticulosis in the entire examined colon. Redundant colon. Exam otherwise normal   ESOPHAGOGASTRODUODENOSCOPY N/A 05/03/2016   Procedure: ESOPHAGOGASTRODUODENOSCOPY (EGD);  Surgeon: Suzette Espy, MD;  Location: [facility];  Service: Endoscopy;  Laterality: N/A;   ESOPHAGOGASTRODUODENOSCOPY N/A 08/23/2016   Procedure: ESOPHAGOGASTRODUODENOSCOPY (EGD);  Surgeon: Suzette Espy, MD;  Location: [facility];  Service: Endoscopy;  Laterality: N/A;   ESOPHAGOGASTRODUODENOSCOPY (EGD) WITH PROPOFOL  N/A 09/20/2020   normal esophagus. Large hiatal hernia. Multiple gastric erosions (Cameron lesions). Normal duodenal bulb and second portion of the duodenum. Suspicion Donelda Fujita lesions causing intermittent bleeding   KNEE ARTHROSCOPY Right    KNEE ARTHROSCOPY WITH MEDIAL MENISECTOMY Left 12/09/2014   Procedure: KNEE ARTHROSCOPY WITH PARTIAL MEDIAL AND LATERAL MENISECTOMY;  Surgeon: Pleasant Brilliant, MD;  Location: [facility] ORS;  Service: Orthopedics;  Laterality: Left;   TUBAL LIGATION  WISDOM TOOTH EXTRACTION      SOCIAL HISTORY:  Social History   Socioeconomic History   Marital status: Single    Spouse name: Not on file   Number of children: 1   Years of education: 12   Highest education level: Not on file  Occupational History   Occupation: Umemployed    Comment: Disability  Tobacco Use   Smoking status: Never   Smokeless tobacco: Never  Vaping Use   Vaping status: Never Used  Substance and Sexual Activity   Alcohol use: No   Drug use: No   Sexual activity: Not Currently    Birth control/protection: Surgical  Other Topics Concern   Not on file  Social History Narrative   Lives at home alone.   Disabled - speech difficulties, cannot drive   Right-handed.   Occasional  caffeine use.   Her daughter lives about 30-45 minutes away   She has lots of family nearby - uncle, mother, sister, brother   Social Drivers of Corporate investment banker Strain: Low Risk  (12/27/2022)   Overall Financial Resource Strain (CARDIA)    Difficulty of Paying Living Expenses: Not hard at all  Food Insecurity: No Food Insecurity (12/27/2022)   Hunger Vital Sign    Worried About Running Out of Food in the Last Year: Never true    Ran Out of Food in the Last Year: Never true  Transportation Needs: No Transportation Needs (12/27/2022)   PRAPARE - Administrator, Civil Service (Medical): No    Lack of Transportation (Non-Medical): No  Physical Activity: Inactive (12/27/2022)   Exercise Vital Sign    Days of Exercise per Week: 0 days    Minutes of Exercise per Session: 0 min  Stress: No Stress Concern Present (12/27/2022)   [employer] of Occupational Health - Occupational Stress Questionnaire    Feeling of Stress : Not at all  Social Connections: Moderately Isolated (12/27/2022)   Social Connection and Isolation Panel [NHANES]    Frequency of Communication with Friends and Family: More than three times a week    Frequency of Social Gatherings with Friends and Family: More than three times a week    Attends Religious Services: More than 4 times per year    Active Member of [employer] or Organizations: No    Attends Banker Meetings: Never    Marital Status: Never married  Intimate Partner Violence: Not At Risk (12/27/2022)   Humiliation, Afraid, Rape, and Kick questionnaire    Fear of Current or Ex-Partner: No    Emotionally Abused: No    Physically Abused: No    Sexually Abused: No    FAMILY HISTORY:  Family History  Problem Relation Age of Onset   Hypertension Mother    Lung cancer Father    Hypertension Brother    Colon cancer Neg Hx    Gastric cancer Neg Hx    Esophageal cancer Neg Hx     CURRENT MEDICATIONS:  Outpatient Encounter  Medications as of 10/09/2023  Medication Sig   acetaminophen  (TYLENOL ) 500 MG tablet Take 1,000 mg by mouth every 6 (six) hours as needed for mild pain (pain score 1-3).   amLODipine  (NORVASC ) 10 MG tablet Take 10 mg by mouth daily.   buPROPion  (WELLBUTRIN  XL) 150 MG 24 hr tablet Take 1 tablet (150 mg total) by mouth daily.   busPIRone  (BUSPAR ) 5 MG tablet TAKE ONE TABLET UP TO THREE TIMES DAILY   celecoxib  (CELEBREX ) 200  MG capsule Take by mouth 2 (two) times daily.   escitalopram  (LEXAPRO ) 20 MG tablet Take 1 tablet (20 mg total) by mouth daily.   oxyCODONE  (ROXICODONE ) 5 MG immediate release tablet Take 1 tablet (5 mg total) by mouth every 6 (six) hours as needed for severe pain (pain score 7-10).   pantoprazole  (PROTONIX ) 40 MG tablet TAKE ONE TABLET DAILY BEFORE BREAKFAST   No facility-administered encounter medications on file as of 10/09/2023.    ALLERGIES:  Allergies  Allergen Reactions   Methylprednisolone  Itching   Venofer  [Iron  Sucrose] Rash    LABORATORY DATA:  I have reviewed the labs as listed.  CBC    Component Value Date/Time   WBC 6.6 10/02/2023 0902   RBC 4.36 10/02/2023 0902   HGB 12.8 10/02/2023 0902   HGB 9.6 (L) 02/19/2023 1500   HCT 39.6 10/02/2023 0902   HCT 30.3 (L) 02/19/2023 1500   PLT 197 10/02/2023 0902   PLT 282 02/19/2023 1500   MCV 90.8 10/02/2023 0902   MCV 88 02/19/2023 1500   MCH 29.4 10/02/2023 0902   MCHC 32.3 10/02/2023 0902   RDW 13.1 10/02/2023 0902   RDW 14.7 02/19/2023 1500   LYMPHSABS 1.5 10/02/2023 0902   LYMPHSABS 1.7 02/19/2023 1500   MONOABS 0.6 10/02/2023 0902   EOSABS 0.3 10/02/2023 0902   EOSABS 0.1 02/19/2023 1500   BASOSABS 0.0 10/02/2023 0902   BASOSABS 0.0 02/19/2023 1500      Latest Ref Rng & Units 10/01/2023    1:01 PM 08/20/2023   11:00 AM 02/19/2023    3:00 PM  CMP  Glucose 70 - 99 mg/dL 98  94  85   BUN 6 - 20 mg/dL 14  9  12    Creatinine 0.44 - 1.00 mg/dL 1.61  0.96  0.45   Sodium 135 - 145 mmol/L 136  144   143   Potassium 3.5 - 5.1 mmol/L 3.6  4.0  4.2   Chloride 98 - 111 mmol/L 100  104  105   CO2 22 - 32 mmol/L 25  26  23    Calcium 8.9 - 10.3 mg/dL 40.9  81.1  9.9   Total Protein 6.0 - 8.5 g/dL  7.3  7.1   Total Bilirubin 0.0 - 1.2 mg/dL  0.3  0.2   Alkaline Phos 44 - 121 IU/L  120  84   AST 0 - 40 IU/L  25  26   ALT 0 - 32 IU/L  42  24     DIAGNOSTIC IMAGING:  I have independently reviewed the relevant imaging and discussed with the patient.   WRAP UP:  All questions were answered. The patient knows to call the clinic with any problems, questions or concerns.  Medical decision making: Moderate  Time spent on visit: I spent 20 minutes counseling the patient face to face. The total time spent in the appointment was 30 minutes and more than 50% was on counseling.  Sonnie Dusky, Durham  10/09/23 9:37 AM

## 2023-10-09 ENCOUNTER — Inpatient Hospital Stay (HOSPITAL_BASED_OUTPATIENT_CLINIC_OR_DEPARTMENT_OTHER): Payer: 59 | Admitting: Physician Assistant

## 2023-10-09 DIAGNOSIS — Z801 Family history of malignant neoplasm of trachea, bronchus and lung: Secondary | ICD-10-CM | POA: Diagnosis not present

## 2023-10-09 DIAGNOSIS — D509 Iron deficiency anemia, unspecified: Secondary | ICD-10-CM | POA: Diagnosis not present

## 2023-10-09 DIAGNOSIS — D500 Iron deficiency anemia secondary to blood loss (chronic): Secondary | ICD-10-CM | POA: Diagnosis not present

## 2023-10-09 DIAGNOSIS — Z79899 Other long term (current) drug therapy: Secondary | ICD-10-CM | POA: Diagnosis not present

## 2023-10-09 NOTE — Patient Instructions (Signed)
 [facility] Discharge Instructions  You were seen today by Sheril Dines PA-C for your iron  deficiency anemia.  Your blood levels look much better!  Your iron  levels are still low.  We will schedule you for IV iron .  FOLLOW-UP APPOINTMENT: 3 months   - - - - - - - - - - - - - - - - - -    Thank you for choosing [facility] at [facility] to provide your oncology and hematology care.  To afford each patient quality time with our provider, please arrive at least 15 minutes before your scheduled appointment time.   If you have a lab appointment with the Cancer Center please come in thru the Main Entrance and check in at the main information desk.  You need to re-schedule your appointment should you arrive 10 or more minutes late.  We strive to give you quality time with our providers, and arriving late affects you and other patients whose appointments are after yours.  Also, if you no show three or more times for appointments you may be dismissed from the clinic at the providers discretion.     Again, thank you for choosing [facility].  Our hope is that these requests will decrease the amount of time that you wait before being seen by our physicians.       _____________________________________________________________  Should you have questions after your visit to [facility], please contact our office at [phone] and follow the prompts.  Our office hours are 8:00 a.m. and 4:30 p.m. Monday - Friday.  Please note that voicemails left after 4:00 p.m. may not be returned until the following business day.  We are closed weekends and major holidays.  You do have access to a nurse 24-7, just call the main number to the clinic [phone] and do not press any options, hold on the line and a nurse will answer the phone.    For prescription refill requests, have your pharmacy contact our office and allow 72  hours.    Due to Covid, you will need to wear a mask upon entering the hospital. If you do not have a mask, a mask will be given to you at the Main Entrance upon arrival. For doctor visits, patients may have 1 support person age 8 or older with them. For treatment visits, patients can not have anyone with them due to social distancing guidelines and our immunocompromised population.

## 2023-10-10 ENCOUNTER — Ambulatory Visit (INDEPENDENT_AMBULATORY_CARE_PROVIDER_SITE_OTHER): Admitting: Physical Medicine and Rehabilitation

## 2023-10-10 ENCOUNTER — Other Ambulatory Visit: Payer: Self-pay

## 2023-10-10 VITALS — BP 137/88 | HR 108

## 2023-10-10 DIAGNOSIS — M47816 Spondylosis without myelopathy or radiculopathy, lumbar region: Secondary | ICD-10-CM

## 2023-10-10 MED ORDER — METHYLPREDNISOLONE ACETATE 40 MG/ML IJ SUSP
40.0000 mg | Freq: Once | INTRAMUSCULAR | Status: AC
  Administered 2023-10-10: 40 mg

## 2023-10-10 NOTE — Patient Instructions (Signed)

## 2023-10-10 NOTE — Progress Notes (Signed)
 Pain Scale   Average Pain 9 Patient advising her lower back pain increases with movent and she does get some relief when resting.        +Driver, -BT, -Dye Allergies.

## 2023-10-10 NOTE — Progress Notes (Signed)
 Brittany Durham - 53 y.o. female MRN [id number]  Date of birth: 1970-09-22  Office Visit Note: Visit Date: 10/10/2023 PCP: Yevette Hem, FNP Referred by: Diedra Fowler, MD  Subjective: Chief Complaint  Patient presents with   Lower Back - Pain   HPI:  Brittany Durham is a 53 y.o. female who comes in today at the request of Dr. Colette Davies for planned Bilateral  L4-5 and L5-S1 Lumbar facet/medial branch block with fluoroscopic guidance.  The patient has failed conservative care including home exercise, medications, time and activity modification.  This injection will be diagnostic and hopefully therapeutic.  Please see requesting physician notes for further details and justification.  Exam has shown concordant pain with facet joint loading.   ROS Otherwise per HPI.  Assessment & Plan: Visit Diagnoses:    ICD-10-CM   1. Spondylosis without myelopathy or radiculopathy, lumbar region  M47.816 XR C-ARM NO REPORT    Facet Injection    methylPREDNISolone  acetate (DEPO-MEDROL ) injection 40 mg      Plan: No additional findings.   Meds & Orders:  Meds ordered this encounter  Medications   methylPREDNISolone  acetate (DEPO-MEDROL ) injection 40 mg    Orders Placed This Encounter  Procedures   Facet Injection   XR C-ARM NO REPORT    Follow-up: Return for visit to requesting provider as needed.   Procedures: No procedures performed  Lumbar Facet Joint Intra-Articular Injection(s) with Fluoroscopic Guidance  Patient: Brittany Durham      Date of Birth: 02-12-1971 MRN: [id number] PCP: Yevette Hem, FNP      Visit Date: 10/10/2023   Universal Protocol:    Date/Time: 10/10/2023  Consent Given By: the patient  Position: PRONE   Additional Comments: Vital signs were monitored before and after the procedure. Patient was prepped and draped in the usual sterile fashion. The correct patient, procedure, and site was verified.   Injection Procedure Details:  Procedure Site  One Meds Administered:  Meds ordered this encounter  Medications   methylPREDNISolone  acetate (DEPO-MEDROL ) injection 40 mg     Laterality: Bilateral  Location/Site:  L4-L5 L5-S1  Needle size: 22 guage  Needle type: Spinal  Needle Placement: Articular  Findings:  -Comments: Excellent flow of contrast producing a partial arthrogram.  Procedure Details: The fluoroscope beam is vertically oriented in AP, and the inferior recess is visualized beneath the lower pole of the inferior apophyseal process, which represents the target point for needle insertion. When direct visualization is difficult the target point is located at the medial projection of the vertebral pedicle. The region overlying each aforementioned target is locally anesthetized with a 1 to 2 ml. volume of 1% Lidocaine  without Epinephrine .   The spinal needle was inserted into each of the above mentioned facet joints using biplanar fluoroscopic guidance. A 0.25 to 0.5 ml. volume of Isovue -250 was injected and a partial facet joint arthrogram was obtained. A single spot film was obtained of the resulting arthrogram.    One to 1.25 ml of the steroid/anesthetic solution was then injected into each of the facet joints noted above.   Additional Comments:  No complications occurred Dressing: 2 x 2 sterile gauze and Band-Aid    Post-procedure details: Patient was observed during the procedure. Post-procedure instructions were reviewed.  Patient left the clinic in stable condition.    Clinical History: CLINICAL DATA:  Low back pain with claudication for 3 months. No known injury or prior relevant surgery.   EXAM: MRI LUMBAR SPINE WITHOUT CONTRAST  TECHNIQUE: Multiplanar, multisequence MR imaging of the lumbar spine was performed. No intravenous contrast was administered.   COMPARISON:  Lumbar spine radiographs 10/27/2020. Abdominopelvic CT 07/26/2022.   FINDINGS: Segmentation: Conventional anatomy assumed,  with the last open disc space designated L5-S1.Concordant with prior imaging.   Alignment:  Minimal convex left scoliosis.   Vertebrae: There is diffusely decreased T1 and T2 marrow signal without focally suspicious lesion, acute fracture or pars defect. The visualized sacroiliac joints appear unremarkable.   Conus medullaris: Extends to the L1 level and appears normal.   Paraspinal and other soft tissues: No significant paraspinal findings. Uterine fibroids noted.   Disc levels:   Sagittal images demonstrate mild disc bulging at T11-12 and T12-L1 without resulting spinal stenosis or nerve root encroachment.   L1-2: Disc desiccation with mild bulging and bilateral facet hypertrophy. No significant spinal stenosis or nerve root encroachment.   L2-3: Preserved disc height and hydration. Mild disc bulging eccentric to the left and mild bilateral facet hypertrophy. No spinal stenosis or nerve root encroachment.   L3-4: Preserved disc height with mild disc bulging, facet and ligamentous hypertrophy. No significant central spinal stenosis. Mild foraminal narrowing bilaterally without definite L3 nerve root encroachment.   L4-5: Preserved disc height with mild disc bulging and a moderate-sized extraforaminal disc extrusion on the left. Resulting left foraminal narrowing and L4 nerve root encroachment. Mild facet and ligamentous hypertrophy. The left lateral recess is mildly narrowed. The spinal canal and right foramen are patent.   L5-S1: Preserved disc height and hydration. Moderate bilateral facet hypertrophy. No spinal stenosis or nerve root encroachment.   IMPRESSION: 1. Moderate-sized extraforaminal disc extrusion on the left at L4-5 with resulting left L4 nerve root encroachment. Correlate with specific radicular symptoms. 2. No other significant spinal stenosis or nerve root encroachment. 3. Nonspecific diffusely decreased T1 and T2 marrow signal without focally  suspicious lesion. This may be related to anemia, smoking or obesity.     Electronically Signed   By: Elmon Hagedorn M.D.   On: 11/25/2022 13:21     Objective:  VS:  HT:    WT:   BMI:     BP:137/88  HR:(!) 108bpm  TEMP: ( )  RESP:  Physical Exam Vitals and nursing note reviewed.  Constitutional:      General: She is not in acute distress.    Appearance: Normal appearance. She is not ill-appearing.  HENT:     Head: Normocephalic and atraumatic.     Right Ear: External ear normal.     Left Ear: External ear normal.  Eyes:     Extraocular Movements: Extraocular movements intact.  Cardiovascular:     Rate and Rhythm: Normal rate.     Pulses: Normal pulses.  Pulmonary:     Effort: Pulmonary effort is normal. No respiratory distress.  Abdominal:     General: There is no distension.     Palpations: Abdomen is soft.  Musculoskeletal:        General: Tenderness present.     Cervical back: Neck supple.     Right lower leg: No edema.     Left lower leg: No edema.     Comments: Patient has good distal strength with no pain over the greater trochanters.  No clonus or focal weakness.  Skin:    Findings: No erythema, lesion or rash.  Neurological:     General: No focal deficit present.     Mental Status: She is alert and oriented to person, place, and  time.     Sensory: No sensory deficit.     Motor: No weakness or abnormal muscle tone.     Coordination: Coordination normal.  Psychiatric:        Mood and Affect: Mood normal.        Behavior: Behavior normal.      Imaging: No results found.

## 2023-10-10 NOTE — Procedures (Signed)
 Lumbar Facet Joint Intra-Articular Injection(s) with Fluoroscopic Guidance  Patient: Brittany Durham      Date of Birth: [birth year] MRN: [id number] PCP: Yevette Hem, FNP      Visit Date: 10/10/2023   Universal Protocol:    Date/Time: 10/10/2023  Consent Given By: the patient  Position: PRONE   Additional Comments: Vital signs were monitored before and after the procedure. Patient was prepped and draped in the usual sterile fashion. The correct patient, procedure, and site was verified.   Injection Procedure Details:  Procedure Site One Meds Administered:  Meds ordered this encounter  Medications   methylPREDNISolone  acetate (DEPO-MEDROL ) injection 40 mg     Laterality: Bilateral  Location/Site:  L4-L5 L5-S1  Needle size: 22 guage  Needle type: Spinal  Needle Placement: Articular  Findings:  -Comments: Excellent flow of contrast producing a partial arthrogram.  Procedure Details: The fluoroscope beam is vertically oriented in AP, and the inferior recess is visualized beneath the lower pole of the inferior apophyseal process, which represents the target point for needle insertion. When direct visualization is difficult the target point is located at the medial projection of the vertebral pedicle. The region overlying each aforementioned target is locally anesthetized with a 1 to 2 ml. volume of 1% Lidocaine  without Epinephrine .   The spinal needle was inserted into each of the above mentioned facet joints using biplanar fluoroscopic guidance. A 0.25 to 0.5 ml. volume of Isovue -250 was injected and a partial facet joint arthrogram was obtained. A single spot film was obtained of the resulting arthrogram.    One to 1.25 ml of the steroid/anesthetic solution was then injected into each of the facet joints noted above.   Additional Comments:  No complications occurred Dressing: 2 x 2 sterile gauze and Band-Aid    Post-procedure details: Patient was observed during  the procedure. Post-procedure instructions were reviewed.  Patient left the clinic in stable condition.

## 2023-10-16 ENCOUNTER — Ambulatory Visit (INDEPENDENT_AMBULATORY_CARE_PROVIDER_SITE_OTHER): Admitting: Orthopedic Surgery

## 2023-10-16 DIAGNOSIS — M5416 Radiculopathy, lumbar region: Secondary | ICD-10-CM

## 2023-10-16 NOTE — Progress Notes (Signed)
 Orthopedic Spine Surgery Office Note   Assessment: Patient is a 53 y.o. female with low back pain that radiates into her left anterior thigh and leg.  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  If the injection does give her significant relief, could consider repeat injections and possible RFA in the future -Discussed that surgery for her issue could predictably relieve leg pain but would not predictably relieve back pain.  I told her that there is a significant chance that she would recover from her surgery and still have the same back pain -I told her that her options if this injection did not work would be pain management or surgery with the understanding that back pain relief would not be predictable as stated above -Patient is going to call me to let me know how the injection goes and from there we will decide how she would like to proceed     Patient expressed understanding of the plan and all questions were answered to the patient's satisfaction.    ___________________________________________________________________________     History:   Patient is a 53 y.o. female who presents today for follow-up on her lumbar spine.  Patient is still having low back pain and left anterior thigh and leg pain.  She says that her leg pain is better since she was last seen.  Her back pain is still significant.  She feels in her lower lumbar region.  After last visit, she got an injection with Dr. Daisey Dryer.  It has been 6 days since that injection but she has not noticed any significant improvement in her back pain.  The majority of her pain is located in the back.  No pain radiating into the right lower extremity.  She has not developed any new symptoms since she was last seen in the office.   Treatments tried: PT, tylenol , ESI, tramadol ,  lumbar facet injections     Physical Exam:   General: no acute distress, appears stated age Neurologic: alert, answering questions appropriately, following commands Respiratory: unlabored breathing on room air, symmetric chest rise Psychiatric: appropriate affect, normal cadence to speech     MSK (spine):   -Strength exam                                                   Left                  Right EHL                              5/5                  5/5 TA                                 5/5                  5/5 GSC  5/5                  5/5 Knee extension            5/5                  5/5 Hip flexion                    5/5                  5/5   -Sensory exam                           Sensation intact to light touch in L3-S1 nerve distributions of bilateral lower extremities   Imaging: XRs of the lumbar spine from 09/16/2023 were previously independently reviewed and interpreted, showing no significant degenerative changes.  No fracture or dislocation seen.  No evidence of instability on flexion/extension views.  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   MRI of the lumbar spine from 09/07/2023 was previously independently reviewed and interpreted, showing left-sided foraminal disc herniation on the left at L4/5. Disc herniation appears smaller when compared to prior MRI from 11/17/2022. No other significant stenosis seen.      Patient name: Brittany Durham Patient MRN: [id number] Date of visit: 10/16/23  I spent 20 minutes in the room with the patient and her daughter going over the plan.  I spent significant time detailing some of the risks associated with surgery and reiterating several times that surgery does not predictably relieve back pain.  We also talked about pain management and what that entails.

## 2023-10-17 ENCOUNTER — Inpatient Hospital Stay: Attending: Hematology

## 2023-10-17 VITALS — BP 154/82 | HR 82 | Temp 97.7°F | Resp 18 | Wt 195.0 lb

## 2023-10-17 DIAGNOSIS — D509 Iron deficiency anemia, unspecified: Secondary | ICD-10-CM | POA: Insufficient documentation

## 2023-10-17 DIAGNOSIS — D500 Iron deficiency anemia secondary to blood loss (chronic): Secondary | ICD-10-CM

## 2023-10-17 MED ORDER — SODIUM CHLORIDE 0.9 % IV SOLN
510.0000 mg | Freq: Once | INTRAVENOUS | Status: AC
  Administered 2023-10-17: 510 mg via INTRAVENOUS
  Filled 2023-10-17: qty 510

## 2023-10-17 MED ORDER — ACETAMINOPHEN 325 MG PO TABS
650.0000 mg | ORAL_TABLET | Freq: Once | ORAL | Status: AC
  Administered 2023-10-17: 650 mg via ORAL
  Filled 2023-10-17: qty 2

## 2023-10-17 MED ORDER — SODIUM CHLORIDE 0.9 % IV SOLN: INTRAVENOUS | Status: DC

## 2023-10-17 MED ORDER — CETIRIZINE HCL 10 MG/ML IV SOLN
10.0000 mg | Freq: Once | INTRAVENOUS | Status: AC
  Administered 2023-10-17: 10 mg via INTRAVENOUS
  Filled 2023-10-17: qty 1

## 2023-10-17 MED ORDER — METHYLPREDNISOLONE SODIUM SUCC 125 MG IJ SOLR
125.0000 mg | Freq: Once | INTRAMUSCULAR | Status: AC
  Administered 2023-10-17: 125 mg via INTRAVENOUS
  Filled 2023-10-17: qty 2

## 2023-10-17 NOTE — Patient Instructions (Signed)
 [facility] - A DEPT OF [facility]  Discharge Instructions: Thank you for choosing [facility] to provide your oncology and hematology care.  If you have a lab appointment with the Cancer Center - please note that after April 8th, 2024, all labs will be drawn in the cancer center.  You do not have to check in or register with the main entrance as you have in the past but will complete your check-in in the cancer center.  Wear comfortable clothing and clothing appropriate for easy access to any Portacath or PICC line.   We strive to give you quality time with your provider. You may need to reschedule your appointment if you arrive late (15 or more minutes).  Arriving late affects you and other patients whose appointments are after yours.  Also, if you miss three or more appointments without notifying the office, you may be dismissed from the clinic at the provider's discretion.      For prescription refill requests, have your pharmacy contact our office and allow 72 hours for refills to be completed.    Today you received the following:  Feraheme.  Ferumoxytol Injection What is this medication? FERUMOXYTOL (FER ue MOX i tol) treats low levels of iron in your body (iron deficiency anemia). Iron is a mineral that plays an important role in making red blood cells, which carry oxygen from your lungs to the rest of your body. This medicine may be used for other purposes; ask your health care provider or pharmacist if you have questions. COMMON BRAND NAME(S): Feraheme What should I tell my care team before I take this medication? They need to know if you have any of these conditions: Anemia not caused by low iron levels High levels of iron in the blood Magnetic resonance imaging (MRI) test scheduled An unusual or allergic reaction to iron, other medications, foods, dyes, or preservatives Pregnant or trying to get pregnant Breastfeeding How should I  use this medication? This medication is injected into a vein. It is given by your care team in a hospital or clinic setting. Talk to your care team the use of this medication in children. Special care may be needed. Overdosage: If you think you have taken too much of this medicine contact a poison control center or emergency room at once. NOTE: This medicine is only for you. Do not share this medicine with others. What if I miss a dose? It is important not to miss your dose. Call your care team if you are unable to keep an appointment. What may interact with this medication? Other iron products This list may not describe all possible interactions. Give your health care provider a list of all the medicines, herbs, non-prescription drugs, or dietary supplements you use. Also tell them if you smoke, drink alcohol, or use illegal drugs. Some items may interact with your medicine. What should I watch for while using this medication? Visit your care team for regular checks on your progress. Tell your care team if your symptoms do not start to get better or if they get worse. You may need blood work done while you are taking this medication. You may need to eat more foods that contain iron. Talk to your care team. Foods that contain iron include whole grains or cereals, dried fruits, beans, peas, leafy green vegetables, and organ meats (liver, kidney). What side effects may I notice from receiving this medication? Side effects that you should  report to your care team as soon as possible: Allergic reactions--skin rash, itching, hives, swelling of the face, lips, tongue, or throat Low blood pressure--dizziness, feeling faint or lightheaded, blurry vision Shortness of breath Side effects that usually do not require medical attention (report to your care team if they continue or are bothersome): Flushing Headache Joint pain Muscle pain Nausea Pain, redness, or irritation at injection site This list  may not describe all possible side effects. Call your doctor for medical advice about side effects. You may report side effects to FDA at 1-800-FDA-1088. Where should I keep my medication? This medication is given in a hospital or clinic. It will not be stored at home. NOTE: This sheet is a summary. It may not cover all possible information. If you have questions about this medicine, talk to your doctor, pharmacist, or health care provider.  2024 Elsevier/Gold Standard (2022-12-19 00:00:00)    To help prevent nausea and vomiting after your treatment, we encourage you to take your nausea medication as directed.  BELOW ARE SYMPTOMS THAT SHOULD BE REPORTED IMMEDIATELY: *FEVER GREATER THAN 100.4 F (38 C) OR HIGHER *CHILLS OR SWEATING *NAUSEA AND VOMITING THAT IS NOT CONTROLLED WITH YOUR NAUSEA MEDICATION *UNUSUAL SHORTNESS OF BREATH *UNUSUAL BRUISING OR BLEEDING *URINARY PROBLEMS (pain or burning when urinating, or frequent urination) *BOWEL PROBLEMS (unusual diarrhea, constipation, pain near the anus) TENDERNESS IN MOUTH AND THROAT WITH OR WITHOUT PRESENCE OF ULCERS (sore throat, sores in mouth, or a toothache) UNUSUAL RASH, SWELLING OR PAIN  UNUSUAL VAGINAL DISCHARGE OR ITCHING   Items with * indicate a potential emergency and should be followed up as soon as possible or go to the Emergency Department if any problems should occur.  Please show the CHEMOTHERAPY ALERT CARD or IMMUNOTHERAPY ALERT CARD at check-in to the Emergency Department and triage nurse.  Should you have questions after your visit or need to cancel or reschedule your appointment, please contact [facility] [place] - A DEPT OF [facility] [phone]  and follow the prompts.  Office hours are 8:00 a.m. to 4:30 p.m. Monday - Friday. Please note that voicemails left after 4:00 p.m. may not be returned until the following business day.  We are closed weekends and major holidays. You have access to a  nurse at all times for urgent questions. Please call the main number to the clinic [phone] and follow the prompts.  For any non-urgent questions, you may also contact your provider using MyChart. We now offer e-Visits for anyone 39 and older to request care online for non-urgent symptoms. For details visit [email or web address].   Also download the MyChart app! Go to the app store, search "MyChart", open the app, select [place], and log in with your MyChart username and password.

## 2023-10-17 NOTE — Progress Notes (Signed)
Patient presents today for iron infusion.  Patient is in satisfactory condition with no new complaints voiced.  Vital signs are stable.  We will proceed with infusion per provider orders.   Patient tolerated treatment well with no complaints voiced.  Patient left ambulatory in stable condition.  Vital signs stable at discharge.  Follow up as scheduled.    

## 2023-10-30 ENCOUNTER — Telehealth: Payer: Self-pay | Admitting: Physical Medicine and Rehabilitation

## 2023-10-30 NOTE — Telephone Encounter (Signed)
 Pt's daughter Brittany Durham called to set her mom an appt for injection with [facility]. Please call pt at [phone].

## 2023-10-31 NOTE — Telephone Encounter (Signed)
 Sent mychart message

## 2023-11-05 ENCOUNTER — Telehealth: Payer: Self-pay

## 2023-11-05 NOTE — Telephone Encounter (Signed)
 Daughter called back and I advised her based of the last note. She will talk to her mom and call back to schedule a appt with Dr Georgina to discuss surgery again

## 2023-11-07 ENCOUNTER — Telehealth: Payer: Self-pay | Admitting: Orthopedic Surgery

## 2023-11-07 DIAGNOSIS — M545 Low back pain, unspecified: Secondary | ICD-10-CM

## 2023-11-07 NOTE — Telephone Encounter (Signed)
 Pt's daughter states the patient would like a referral to a pain clinic

## 2023-11-29 DIAGNOSIS — Z79899 Other long term (current) drug therapy: Secondary | ICD-10-CM | POA: Diagnosis not present

## 2023-11-29 DIAGNOSIS — M5416 Radiculopathy, lumbar region: Secondary | ICD-10-CM | POA: Diagnosis not present

## 2023-11-29 DIAGNOSIS — M129 Arthropathy, unspecified: Secondary | ICD-10-CM | POA: Diagnosis not present

## 2023-11-29 DIAGNOSIS — R030 Elevated blood-pressure reading, without diagnosis of hypertension: Secondary | ICD-10-CM | POA: Diagnosis not present

## 2023-12-16 DIAGNOSIS — R768 Other specified abnormal immunological findings in serum: Secondary | ICD-10-CM | POA: Diagnosis not present

## 2023-12-16 DIAGNOSIS — M5416 Radiculopathy, lumbar region: Secondary | ICD-10-CM | POA: Diagnosis not present

## 2023-12-16 DIAGNOSIS — R030 Elevated blood-pressure reading, without diagnosis of hypertension: Secondary | ICD-10-CM | POA: Diagnosis not present

## 2023-12-16 DIAGNOSIS — Z79899 Other long term (current) drug therapy: Secondary | ICD-10-CM | POA: Diagnosis not present

## 2023-12-17 DIAGNOSIS — R079 Chest pain, unspecified: Secondary | ICD-10-CM | POA: Diagnosis not present

## 2023-12-17 DIAGNOSIS — K449 Diaphragmatic hernia without obstruction or gangrene: Secondary | ICD-10-CM | POA: Diagnosis not present

## 2023-12-17 DIAGNOSIS — K219 Gastro-esophageal reflux disease without esophagitis: Secondary | ICD-10-CM | POA: Diagnosis not present

## 2023-12-31 ENCOUNTER — Ambulatory Visit: Payer: 59

## 2023-12-31 VITALS — BP 133/83 | HR 96 | Ht 67.0 in | Wt 200.0 lb

## 2023-12-31 DIAGNOSIS — Z0000 Encounter for general adult medical examination without abnormal findings: Secondary | ICD-10-CM

## 2023-12-31 NOTE — Patient Instructions (Addendum)
 Ms. Pensinger , Thank you for taking time out of your busy schedule to complete your Annual Wellness Visit with me. I enjoyed our conversation and look forward to speaking with you again next year. I, as well as your care team,  appreciate your ongoing commitment to your health goals. Please review the following plan we discussed and let me know if I can assist you in the future. Your Game plan/ To Do List    Referrals: If you haven't heard from the office you've been referred to, please reach out to them at the phone provided.   Follow up Visits: We will see or speak with you next year for your Next Medicare AWV with our clinical staff on 12/31/24 at 10:40a.m. Have you seen your provider in the last 6 months (3 months if uncontrolled diabetes)? Yes  Clinician Recommendations:  Aim for 30 minutes of exercise or brisk walking, 6-8 glasses of water , and 5 servings of fruits and vegetables each day.       This is a list of the screenings recommended for you:  Health Maintenance  Topic Date Due   Hepatitis B Vaccine (1 of 3 - 19+ 3-dose series) Never done   Pneumococcal Vaccine for age over 22 (1 of 1 - PCV) Never done   COVID-19 Vaccine (3 - Moderna risk series) 09/23/2019   Medicare Annual Wellness Visit  12/27/2023   Flu Shot  12/13/2023   Mammogram  03/07/2025   Pap with HPV screening  04/27/2027   DTaP/Tdap/Td vaccine (2 - Td or Tdap) 08/11/2031   Colon Cancer Screening  03/30/2032   Hepatitis C Screening  Completed   HIV Screening  Completed   Zoster (Shingles) Vaccine  Completed   HPV Vaccine  Aged Out   Meningitis B Vaccine  Aged Out    Advanced directives: (Copy Requested) Please bring a copy of your health care power of attorney and living will to the office to be added to your chart at your convenience. You can mail to [facility] [address] [place], [place] [address] or email to [email or web address] Advance Care Planning is important because it:  [x]   Makes sure you receive the medical care that is consistent with your values, goals, and preferences  [x]  It provides guidance to your family and loved ones and reduces their decisional burden about whether or not they are making the right decisions based on your wishes.  Follow the link provided in your after visit summary or read over the paperwork we have mailed to you to help you started getting your Advance Directives in place. If you need assistance in completing these, please reach out to us  so that we can help you!  See attachments for Preventive Care and Fall Prevention Tips.

## 2023-12-31 NOTE — Progress Notes (Signed)
 Subjective:   Brittany Durham is a 53 y.o. who presents for a Medicare Wellness preventive visit.  As a reminder, Annual Wellness Visits don't include a physical exam, and some assessments may be limited, especially if this visit is performed virtually. We may recommend an in-person follow-up visit with your provider if needed.  Visit Complete: Virtual I connected with  Brittany Durham on 12/31/23 by a audio enabled telemedicine application and verified that I am speaking with the correct person using two identifiers.  Patient Location: Home  Provider Location: Home Office  I discussed the limitations of evaluation and management by telemedicine. The patient expressed understanding and agreed to proceed.  Vital Signs: Because this visit was a virtual/telehealth visit, some criteria may be missing or patient reported. Any vitals not documented were not able to be obtained and vitals that have been documented are patient reported.  VideoDeclined- This patient declined Librarian, academic. Therefore the visit was completed with audio only.  Persons Participating in Visit: Patient.  AWV Questionnaire: No: Patient Medicare AWV questionnaire was not completed prior to this visit.  Cardiac Risk Factors include: advanced age (>70men, >47 women);hypertension;obesity (BMI >30kg/m2)     Objective:    Today's Vitals   12/31/23 1121 12/31/23 1123  BP: 133/83   Pulse: 96   Weight: 200 lb (90.7 kg)   Height: 5' 7 (1.702 m)   PainSc:  9    Body mass index is 31.32 kg/m.     12/31/2023   11:28 AM 10/17/2023   12:52 PM 10/09/2023    9:20 AM 10/01/2023   12:23 PM 07/16/2023    9:48 AM 07/09/2023   10:11 AM 05/31/2023   11:50 AM  Advanced Directives  Does Patient Have a Medical Advance Directive? Yes Yes No No Yes Yes Yes  Type of Sales promotion account executive of [employer];Living will   Healthcare Power of [place];Living will Healthcare  Power of [place];Living will Healthcare Power of [place];Living will  Does patient want to make changes to medical advance directive?   No - Patient declined   No - Patient declined   Copy of Healthcare Power of Attorney in Chart? No - copy requested    No - copy requested No - copy requested No - copy requested  Would patient like information on creating a medical advance directive?   No - Patient declined   No - Patient declined No - Patient declined    Current Medications (verified) Outpatient Encounter Medications as of 12/31/2023  Medication Sig   acetaminophen  (TYLENOL ) 500 MG tablet Take 1,000 mg by mouth every 6 (six) hours as needed for mild pain (pain score 1-3).   amLODipine  (NORVASC ) 10 MG tablet Take 10 mg by mouth daily.   buPROPion  (WELLBUTRIN  XL) 150 MG 24 hr tablet Take 1 tablet (150 mg total) by mouth daily.   busPIRone  (BUSPAR ) 5 MG tablet TAKE ONE TABLET UP TO THREE TIMES DAILY   celecoxib  (CELEBREX ) 200 MG capsule Take by mouth 2 (two) times daily.   escitalopram  (LEXAPRO ) 20 MG tablet Take 1 tablet (20 mg total) by mouth daily.   oxyCODONE  (ROXICODONE ) 5 MG immediate release tablet Take 1 tablet (5 mg total) by mouth every 6 (six) hours as needed for severe pain (pain score 7-10).   pantoprazole  (PROTONIX ) 40 MG tablet TAKE ONE TABLET DAILY BEFORE BREAKFAST   No facility-administered encounter medications on file as of 12/31/2023.    Allergies (verified) Methylprednisolone  and Venofer  [  iron  sucrose]   History: Past Medical History:  Diagnosis Date   Anemia    Asthma    childhood   Carotidynia    COPD (chronic obstructive pulmonary disease) (HCC)    DDD (degenerative disc disease), cervical    GERD (gastroesophageal reflux disease)    Hypertension    Knee pain    left meniscus tear s/p repair 11/2014   Seizures (HCC)    last seizure was over 10 years ago, unknown etiology   Stuttering    patient reports this started after her knee surgery in 11/2014.     Thyroid  nodule    Past Surgical History:  Procedure Laterality Date   ANTERIOR CERVICAL DECOMP/DISCECTOMY FUSION N/A 03/20/2019   Procedure: Anterior Cervical Decompression Fusion - Cervical Four-Cervical Five - Cervical Five-Cervical Six;  Surgeon: Joshua Alm RAMAN, MD;  Location: [facility] OR;  Service: Neurosurgery;  Laterality: N/A;  Anterior Cervical Decompression Fusion - Cervical Four-Cervical Five - Cervical Five-Cervical Six   BIOPSY  09/20/2020   Procedure: BIOPSY;  Surgeon: Shaaron Lamar HERO, MD;  [facility];  Service: Endoscopy;;   COLONOSCOPY N/A 08/23/2016   Procedure: COLONOSCOPY;  Surgeon: Lamar HERO Shaaron, MD;  Location: [facility];  Service: Endoscopy;  Laterality: N/A;  1:00pm   COLONOSCOPY N/A 08/24/2020   Diverticulosis in the entire examined colon. Redundant colon. Exam otherwise normal   ESOPHAGOGASTRODUODENOSCOPY N/A 05/03/2016   Procedure: ESOPHAGOGASTRODUODENOSCOPY (EGD);  Surgeon: Lamar HERO Shaaron, MD;  Location: [facility];  Service: Endoscopy;  Laterality: N/A;   ESOPHAGOGASTRODUODENOSCOPY N/A 08/23/2016   Procedure: ESOPHAGOGASTRODUODENOSCOPY (EGD);  Surgeon: Lamar HERO Shaaron, MD;  [facility]: [facility];  Service: Endoscopy;  Laterality: N/A;   ESOPHAGOGASTRODUODENOSCOPY (EGD) WITH PROPOFOL  N/A 09/20/2020   normal esophagus. Large hiatal hernia. Multiple gastric erosions (Cameron lesions). Normal duodenal bulb and second portion of the duodenum. Suspicion Ole lesions causing intermittent bleeding   KNEE ARTHROSCOPY Right    KNEE ARTHROSCOPY WITH MEDIAL MENISECTOMY Left 12/09/2014   Procedure: KNEE ARTHROSCOPY WITH PARTIAL MEDIAL AND LATERAL MENISECTOMY;  Surgeon: Lemond Stable, MD;  Location: [facility];  Service: Orthopedics;  Laterality: Left;   TUBAL LIGATION     WISDOM TOOTH EXTRACTION     Family History  Problem Relation Age of Onset   Hypertension Mother    Lung cancer Father    Hypertension Brother    Colon cancer Neg Hx    Gastric cancer Neg Hx     Esophageal cancer Neg Hx    Social History   Socioeconomic History   Marital status: Single    Spouse name: Not on file   Number of children: 1   Years of education: 12   Highest education level: Not on file  Occupational History   Occupation: Umemployed    Comment: Disability  Tobacco Use   Smoking status: Never   Smokeless tobacco: Never  Vaping Use   Vaping status: Never Used  Substance and Sexual Activity   Alcohol use: No   Drug use: No   Sexual activity: Not Currently    Birth control/protection: Surgical  Other Topics Concern   Not on file  Social History Narrative   Lives at home alone.   Disabled - speech difficulties, cannot drive   Right-handed.   Occasional caffeine use.   Her daughter lives about 30-45 minutes away   She has lots of family nearby - uncle, mother, sister, brother   Social Drivers of Corporate investment banker Strain: Low Risk  (  12/31/2023)   Overall Financial Resource Strain (CARDIA)    Difficulty of Paying Living Expenses: Not hard at all  Food Insecurity: No Food Insecurity (12/31/2023)   Hunger Vital Sign    Worried About Running Out of Food in the Last Year: Never true    Ran Out of Food in the Last Year: Never true  Transportation Needs: No Transportation Needs (12/31/2023)   PRAPARE - Administrator, Civil Service (Medical): No    Lack of Transportation (Non-Medical): No  Physical Activity: Insufficiently Active (12/31/2023)   Exercise Vital Sign    Days of Exercise per Week: 2 days    Minutes of Exercise per Session: 30 min  Stress: No Stress Concern Present (12/31/2023)   [employer] of Occupational Health - Occupational Stress Questionnaire    Feeling of Stress: Not at all  Social Connections: Moderately Integrated (12/31/2023)   Social Connection and Isolation Panel    Frequency of Communication with Friends and Family: More than three times a week    Frequency of Social Gatherings with Friends and Family:  More than three times a week    Attends Religious Services: More than 4 times per year    Active Member of [employer] or Organizations: Yes    Attends Banker Meetings: Never    Marital Status: Never married    Tobacco Counseling Counseling given: Yes    Clinical Intake:  Pre-visit preparation completed: Yes  Pain : 0-10 (lower Back) Pain Score: 9  Pain Type: Chronic pain Pain Location: Back Pain Orientation: Lower Pain Descriptors / Indicators: Constant Pain Onset: More than a month ago Pain Frequency: Constant Pain Relieving Factors: tylenol   Pain Relieving Factors: tylenol   BMI - recorded: 31.23 Nutritional Status: BMI > 30  Obese Nutritional Risks: None Diabetes: No  Lab Results  Component Value Date   HGBA1C 5.1 12/12/2014     How often do you need to have someone help you when you read instructions, pamphlets, or other written materials from your doctor or pharmacy?: 1 - Never  Interpreter Needed?: No  Information entered by :: alia t/cma   Activities of Daily Living     12/31/2023   11:25 AM  In your present state of health, do you have any difficulty performing the following activities:  Hearing? 0  Vision? 0  Difficulty concentrating or making decisions? 0  Walking or climbing stairs? 1  Dressing or bathing? 0  Doing errands, shopping? 1  Comment pt's daughter  Quarry manager and eating ? N  Using the Toilet? N  In the past six months, have you accidently leaked urine? N  Do you have problems with loss of bowel control? Y  Managing your Medications? N  Managing your Finances? N  Housekeeping or managing your Housekeeping? N    Patient Care Team: Lavell Bari LABOR, FNP as PCP - General (Nurse Practitioner) Lonni Slain, MD as PCP - Cardiology (Cardiology) Shaaron Lamar HERO, MD as Consulting Physician (Gastroenterology) Shirlean Therisa ORN, NP (Gastroenterology) Lamon Pleasant HERO DEVONNA as Physician Assistant (Oncology)  I  have updated your Care Teams any recent Medical Services you may have received from other providers in the past year.     Assessment:   This is a routine wellness examination for Brittany Durham.  Hearing/Vision screen Hearing Screening - Comments:: Pt denies hearing dif Vision Screening - Comments:: Pt wear glasses/pt last apt over yr ago   Goals Addressed  This Visit's Progress    Exercise 3x per week (30 min per time)   On track      Depression Screen     12/31/2023   11:30 AM 08/20/2023   10:59 AM 02/19/2023    2:09 PM 12/27/2022    2:04 PM 09/06/2022   10:22 AM 08/31/2022   11:30 AM 07/26/2022   10:54 AM  PHQ 2/9 Scores  PHQ - 2 Score 2 4 4  0 0 0 6  PHQ- 9 Score 6 15 11    0 18    Fall Risk     12/31/2023   11:23 AM 12/27/2022    2:03 PM 09/06/2022   10:22 AM 08/31/2022   11:30 AM 07/26/2022   10:54 AM  Fall Risk   Falls in the past year? 0 0 0 0 0  Number falls in past yr: 0 0   0  Injury with Fall? 0 0   0  Risk for fall due to : No Fall Risks No Fall Risks   No Fall Risks  Follow up Falls evaluation completed Falls prevention discussed   Falls evaluation completed    MEDICARE RISK AT HOME:  Medicare Risk at Home Any stairs in or around the home?: No If so, are there any without handrails?: No Home free of loose throw rugs in walkways, pet beds, electrical cords, etc?: Yes Adequate lighting in your home to reduce risk of falls?: Yes Life alert?: No Use of a cane, walker or w/c?: No Grab bars in the bathroom?: Yes Shower chair or bench in shower?: No Elevated toilet seat or a handicapped toilet?: No  TIMED UP AND GO:  Was the test performed?  no  Cognitive Function: 6CIT completed        12/31/2023   11:33 AM 12/27/2022    2:05 PM 12/13/2020    3:55 PM 12/09/2019    1:37 PM 12/09/2019    1:29 PM  6CIT Screen  What Year? 0 points 0 points 0 points  0 points  What month? 0 points 0 points 0 points  0 points  What time? 0 points 0 points 0 points  0  points  Count back from 20 0 points 0 points 0 points  0 points  Months in reverse 0 points 0 points 0 points 2 points 0 points  Repeat phrase 0 points 0 points 0 points  0 points  Total Score 0 points 0 points 0 points  0 points    Immunizations Immunization History  Administered Date(s) Administered   Influenza,inj,Quad PF,6+ Mos 03/06/2022   Moderna Sars-Covid-2 Vaccination 07/25/2019, 08/26/2019   Tdap 08/10/2021   Zoster Recombinant(Shingrix ) 03/26/2022, 03/05/2023    Screening Tests Health Maintenance  Topic Date Due   Hepatitis B Vaccines 19-59 Average Risk (1 of 3 - 19+ 3-dose series) Never done   Pneumococcal Vaccine: 50+ Years (1 of 1 - PCV) Never done   COVID-19 Vaccine (3 - Moderna risk series) 09/23/2019   INFLUENZA VACCINE  12/13/2023   Medicare Annual Wellness (AWV)  12/30/2024   MAMMOGRAM  03/07/2025   Cervical Cancer Screening (HPV/Pap Cotest)  04/27/2027   DTaP/Tdap/Td (2 - Td or Tdap) 08/11/2031   Colonoscopy  03/30/2032   Hepatitis C Screening  Completed   HIV Screening  Completed   Zoster Vaccines- Shingrix   Completed   HPV VACCINES  Aged Out   Meningococcal B Vaccine  Aged Out    Health Maintenance  Health Maintenance Due  Topic Date Due  Hepatitis B Vaccines 19-59 Average Risk (1 of 3 - 19+ 3-dose series) Never done   Pneumococcal Vaccine: 50+ Years (1 of 1 - PCV) Never done   COVID-19 Vaccine (3 - Moderna risk series) 09/23/2019   INFLUENZA VACCINE  12/13/2023   Health Maintenance Items Addressed: See Nurse Notes at the end of this note  Additional Screening:  Vision Screening: Recommended annual ophthalmology exams for early detection of glaucoma and other disorders of the eye. Would you like a referral to an eye doctor? No    Dental Screening: Recommended annual dental exams for proper oral hygiene  Community Resource Referral / Chronic Care Management: CRR required this visit?  No   CCM required this visit?  No   Plan:    I  have personally reviewed and noted the following in the patient's chart:   Medical and social history Use of alcohol, tobacco or illicit drugs  Current medications and supplements including opioid prescriptions. Patient is currently taking opioid prescriptions. Information provided to patient regarding non-opioid alternatives. Patient advised to discuss non-opioid treatment plan with their provider. Functional ability and status Nutritional status Physical activity Advanced directives List of other physicians Hospitalizations, surgeries, and ER visits in previous 12 months Vitals Screenings to include cognitive, depression, and falls Referrals and appointments  In addition, I have reviewed and discussed with patient certain preventive protocols, quality metrics, and best practice recommendations. A written personalized care plan for preventive services as well as general preventive health recommendations were provided to patient.   Brittany Durham, CMA   12/31/2023   After Visit Summary: (MyChart) Due to this being a telephonic visit, the after visit summary with patients personalized plan was offered to patient via MyChart   Notes: Nothing significant to report at this time.

## 2024-01-06 ENCOUNTER — Other Ambulatory Visit: Payer: Self-pay | Admitting: Family

## 2024-01-06 DIAGNOSIS — F411 Generalized anxiety disorder: Secondary | ICD-10-CM

## 2024-01-15 ENCOUNTER — Inpatient Hospital Stay: Attending: Hematology

## 2024-01-15 DIAGNOSIS — D500 Iron deficiency anemia secondary to blood loss (chronic): Secondary | ICD-10-CM

## 2024-01-15 DIAGNOSIS — Z79899 Other long term (current) drug therapy: Secondary | ICD-10-CM | POA: Diagnosis not present

## 2024-01-15 DIAGNOSIS — D509 Iron deficiency anemia, unspecified: Secondary | ICD-10-CM | POA: Insufficient documentation

## 2024-01-15 LAB — CBC WITH DIFFERENTIAL/PLATELET
Abs Immature Granulocytes: 0.07 K/uL (ref 0.00–0.07)
Basophils Absolute: 0 K/uL (ref 0.0–0.1)
Basophils Relative: 1 %
Eosinophils Absolute: 0.2 K/uL (ref 0.0–0.5)
Eosinophils Relative: 3 %
HCT: 30.3 % — ABNORMAL LOW (ref 36.0–46.0)
Hemoglobin: 9.6 g/dL — ABNORMAL LOW (ref 12.0–15.0)
Immature Granulocytes: 1 %
Lymphocytes Relative: 33 %
Lymphs Abs: 2.2 K/uL (ref 0.7–4.0)
MCH: 29.1 pg (ref 26.0–34.0)
MCHC: 31.7 g/dL (ref 30.0–36.0)
MCV: 91.8 fL (ref 80.0–100.0)
Monocytes Absolute: 0.6 K/uL (ref 0.1–1.0)
Monocytes Relative: 8 %
Neutro Abs: 3.6 K/uL (ref 1.7–7.7)
Neutrophils Relative %: 54 %
Platelets: 235 K/uL (ref 150–400)
RBC: 3.3 MIL/uL — ABNORMAL LOW (ref 3.87–5.11)
RDW: 12.9 % (ref 11.5–15.5)
WBC: 6.6 K/uL (ref 4.0–10.5)
nRBC: 0 % (ref 0.0–0.2)

## 2024-01-15 LAB — IRON AND TIBC
Iron: 62 ug/dL (ref 28–170)
Saturation Ratios: 14 % (ref 10.4–31.8)
TIBC: 439 ug/dL (ref 250–450)
UIBC: 377 ug/dL

## 2024-01-15 LAB — FERRITIN: Ferritin: 8 ng/mL — ABNORMAL LOW (ref 11–307)

## 2024-01-17 ENCOUNTER — Encounter (HOSPITAL_BASED_OUTPATIENT_CLINIC_OR_DEPARTMENT_OTHER): Payer: Self-pay | Admitting: Emergency Medicine

## 2024-01-17 ENCOUNTER — Emergency Department (HOSPITAL_BASED_OUTPATIENT_CLINIC_OR_DEPARTMENT_OTHER): Admitting: Radiology

## 2024-01-17 ENCOUNTER — Ambulatory Visit (HOSPITAL_BASED_OUTPATIENT_CLINIC_OR_DEPARTMENT_OTHER): Admitting: Cardiology

## 2024-01-17 ENCOUNTER — Other Ambulatory Visit: Payer: Self-pay

## 2024-01-17 ENCOUNTER — Observation Stay (HOSPITAL_BASED_OUTPATIENT_CLINIC_OR_DEPARTMENT_OTHER)
Admission: EM | Admit: 2024-01-17 | Discharge: 2024-01-19 | Disposition: A | Discharge status: Home / Self Care | Source: Ambulatory Visit | Attending: Emergency Medicine | Admitting: Emergency Medicine

## 2024-01-17 ENCOUNTER — Emergency Department (HOSPITAL_BASED_OUTPATIENT_CLINIC_OR_DEPARTMENT_OTHER)

## 2024-01-17 ENCOUNTER — Encounter (HOSPITAL_BASED_OUTPATIENT_CLINIC_OR_DEPARTMENT_OTHER): Payer: Self-pay | Admitting: Cardiology

## 2024-01-17 VITALS — BP 130/82 | HR 71 | Resp 17 | Ht 67.0 in | Wt 200.0 lb

## 2024-01-17 DIAGNOSIS — R079 Chest pain, unspecified: Secondary | ICD-10-CM | POA: Diagnosis not present

## 2024-01-17 DIAGNOSIS — R0789 Other chest pain: Secondary | ICD-10-CM | POA: Diagnosis present

## 2024-01-17 DIAGNOSIS — I771 Stricture of artery: Secondary | ICD-10-CM | POA: Diagnosis not present

## 2024-01-17 DIAGNOSIS — I10 Essential (primary) hypertension: Secondary | ICD-10-CM | POA: Diagnosis not present

## 2024-01-17 DIAGNOSIS — R479 Unspecified speech disturbances: Secondary | ICD-10-CM | POA: Diagnosis not present

## 2024-01-17 DIAGNOSIS — D649 Anemia, unspecified: Secondary | ICD-10-CM | POA: Insufficient documentation

## 2024-01-17 DIAGNOSIS — R6889 Other general symptoms and signs: Secondary | ICD-10-CM | POA: Diagnosis not present

## 2024-01-17 DIAGNOSIS — J45909 Unspecified asthma, uncomplicated: Secondary | ICD-10-CM | POA: Diagnosis not present

## 2024-01-17 DIAGNOSIS — R7989 Other specified abnormal findings of blood chemistry: Secondary | ICD-10-CM

## 2024-01-17 DIAGNOSIS — K449 Diaphragmatic hernia without obstruction or gangrene: Secondary | ICD-10-CM | POA: Diagnosis not present

## 2024-01-17 DIAGNOSIS — Z712 Person consulting for explanation of examination or test findings: Secondary | ICD-10-CM

## 2024-01-17 DIAGNOSIS — Z79899 Other long term (current) drug therapy: Secondary | ICD-10-CM | POA: Diagnosis not present

## 2024-01-17 DIAGNOSIS — J449 Chronic obstructive pulmonary disease, unspecified: Secondary | ICD-10-CM | POA: Insufficient documentation

## 2024-01-17 DIAGNOSIS — Z743 Need for continuous supervision: Secondary | ICD-10-CM | POA: Diagnosis not present

## 2024-01-17 DIAGNOSIS — R778 Other specified abnormalities of plasma proteins: Secondary | ICD-10-CM | POA: Diagnosis not present

## 2024-01-17 DIAGNOSIS — I700 Atherosclerosis of aorta: Secondary | ICD-10-CM | POA: Diagnosis not present

## 2024-01-17 LAB — CBC
HCT: 31.1 % — ABNORMAL LOW (ref 36.0–46.0)
Hemoglobin: 10 g/dL — ABNORMAL LOW (ref 12.0–15.0)
MCH: 28.9 pg (ref 26.0–34.0)
MCHC: 32.2 g/dL (ref 30.0–36.0)
MCV: 89.9 fL (ref 80.0–100.0)
Platelets: 256 K/uL (ref 150–400)
RBC: 3.46 MIL/uL — ABNORMAL LOW (ref 3.87–5.11)
RDW: 13.5 % (ref 11.5–15.5)
WBC: 6.8 K/uL (ref 4.0–10.5)
nRBC: 0 % (ref 0.0–0.2)

## 2024-01-17 LAB — D-DIMER, QUANTITATIVE: D-Dimer, Quant: 10.85 ug{FEU}/mL — ABNORMAL HIGH (ref 0.00–0.50)

## 2024-01-17 LAB — BASIC METABOLIC PANEL WITH GFR
Anion gap: 12 (ref 5–15)
BUN: 11 mg/dL (ref 6–20)
CO2: 25 mmol/L (ref 22–32)
Calcium: 10.8 mg/dL — ABNORMAL HIGH (ref 8.9–10.3)
Chloride: 102 mmol/L (ref 98–111)
Creatinine, Ser: 0.84 mg/dL (ref 0.44–1.00)
GFR, Estimated: >60 mL/min (ref >60)
Glucose, Bld: 94 mg/dL (ref 70–99)
Potassium: 4.1 mmol/L (ref 3.5–5.1)
Sodium: 140 mmol/L (ref 135–145)

## 2024-01-17 LAB — TROPONIN T, HIGH SENSITIVITY
Troponin T High Sensitivity: 82 ng/L — ABNORMAL HIGH (ref 0–19)
Troponin T High Sensitivity: 79 ng/L — ABNORMAL HIGH (ref 0–19)

## 2024-01-17 LAB — MRSA NEXT GEN BY PCR, NASAL: MRSA by PCR Next Gen: NOT DETECTED

## 2024-01-17 MED ORDER — PANTOPRAZOLE SODIUM 40 MG IV SOLR
40.0000 mg | Freq: Once | INTRAVENOUS | Status: AC
  Administered 2024-01-17: 40 mg via INTRAVENOUS
  Filled 2024-01-17: qty 10

## 2024-01-17 MED ORDER — MORPHINE SULFATE (PF) 4 MG/ML IV SOLN
4.0000 mg | Freq: Once | INTRAVENOUS | Status: AC
  Administered 2024-01-17: 4 mg via INTRAVENOUS
  Filled 2024-01-17: qty 1

## 2024-01-17 MED ORDER — ONDANSETRON HCL 4 MG/2ML IJ SOLN
4.0000 mg | Freq: Once | INTRAMUSCULAR | Status: AC
  Administered 2024-01-17: 4 mg via INTRAVENOUS
  Filled 2024-01-17: qty 2

## 2024-01-17 MED ORDER — ENOXAPARIN SODIUM 40 MG/0.4ML IJ SOSY
40.0000 mg | PREFILLED_SYRINGE | INTRAMUSCULAR | Status: DC
  Administered 2024-01-18 – 2024-01-19 (×2): 40 mg via SUBCUTANEOUS
  Filled 2024-01-17 (×2): qty 0.4

## 2024-01-17 MED ORDER — FENTANYL CITRATE PF 50 MCG/ML IJ SOSY
50.0000 ug | PREFILLED_SYRINGE | Freq: Once | INTRAMUSCULAR | Status: AC
  Administered 2024-01-17: 50 ug via INTRAVENOUS
  Filled 2024-01-17: qty 1

## 2024-01-17 MED ORDER — IOHEXOL 350 MG/ML SOLN
75.0000 mL | Freq: Once | INTRAVENOUS | Status: AC | PRN
  Administered 2024-01-17: 75 mL via INTRAVENOUS

## 2024-01-17 MED ORDER — HYDROMORPHONE HCL 1 MG/ML IJ SOLN
1.0000 mg | Freq: Once | INTRAMUSCULAR | Status: AC
  Administered 2024-01-17: 1 mg via INTRAVENOUS
  Filled 2024-01-17: qty 1

## 2024-01-17 NOTE — ED Notes (Signed)
 Purple man green.

## 2024-01-17 NOTE — H&P (Addendum)
 History and Physical    PatientKeyly Durham FMW:[id number] DOB: 1970/06/15 DOA: 01/17/2024 DOS: the patient was seen and examined on 01/17/2024 PCP: Lavell Bari LABOR, FNP  Patient coming from: Home  Chief Complaint: Chest pain Chief Complaint  Patient presents with   Chest Pain   HPI: Brittany Durham is a 53 y.o. female with medical history significant of hypertension, speech abnormality, COPD, GERD, who follows up with cardiology on account of longstanding history of chest pain.  According to patient her chest pain became more pronounced within the last few days not related to exertion.  She followed up with her cardiologist today and was told to come to the emergency room for further management.  Patient presented to [facility] and ED physician discussed with cardiologist Dr. Mona who recommended patient be admitted under hospitalist service for further evaluation and to obtain echocardiogram and conveyed that cardiologist is available if additional assistance is needed.  By the time patient was being seen she denied any active ongoing chest pain, nausea or vomiting, abdominal pain, cough or urinary complaints.  ED course: Upon arrival to the emergency room temperature 97.7 respiratory rate 18 pulse 101 blood pressure 138/88 saturating 100% on room air EKG shows sinus tachycardia with no signs of ischemia  Review of Systems: As mentioned in the history of present illness. All other systems reviewed and are negative. Past Medical History:  Diagnosis Date   Anemia    Asthma    childhood   Carotidynia    COPD (chronic obstructive pulmonary disease) (HCC)    DDD (degenerative disc disease), cervical    GERD (gastroesophageal reflux disease)    Hypertension    Knee pain    left meniscus tear s/p repair 11/2014   Seizures (HCC)    last seizure was over 10 years ago, unknown etiology   Stuttering    patient reports this started after her knee surgery in 11/2014.    Thyroid   nodule    Past Surgical History:  Procedure Laterality Date   ANTERIOR CERVICAL DECOMP/DISCECTOMY FUSION N/A 03/20/2019   Procedure: Anterior Cervical Decompression Fusion - Cervical Four-Cervical Five - Cervical Five-Cervical Six;  Surgeon: Joshua Alm RAMAN, MD;  Location: [facility] OR;  Service: Neurosurgery;  Laterality: N/A;  Anterior Cervical Decompression Fusion - Cervical Four-Cervical Five - Cervical Five-Cervical Six   BIOPSY  09/20/2020   Procedure: BIOPSY;  Surgeon: Shaaron Lamar HERO, MD;  [facility];  Service: Endoscopy;;   COLONOSCOPY N/A 08/23/2016   Procedure: COLONOSCOPY;  Surgeon: Lamar HERO Shaaron, MD;  Location: [facility];  Service: Endoscopy;  Laterality: N/A;  1:00pm   COLONOSCOPY N/A 08/24/2020   Diverticulosis in the entire examined colon. Redundant colon. Exam otherwise normal   ESOPHAGOGASTRODUODENOSCOPY N/A 05/03/2016   Procedure: ESOPHAGOGASTRODUODENOSCOPY (EGD);  Surgeon: Lamar HERO Shaaron, MD;  [facility];  Service: Endoscopy;  Laterality: N/A;   ESOPHAGOGASTRODUODENOSCOPY N/A 08/23/2016   Procedure: ESOPHAGOGASTRODUODENOSCOPY (EGD);  Surgeon: Lamar HERO Shaaron, MD;  [facility];  Service: Endoscopy;  Laterality: N/A;   ESOPHAGOGASTRODUODENOSCOPY (EGD) WITH PROPOFOL  N/A 09/20/2020   normal esophagus. Large hiatal hernia. Multiple gastric erosions (Cameron lesions). Normal duodenal bulb and second portion of the duodenum. Suspicion Ole lesions causing intermittent bleeding   KNEE ARTHROSCOPY Right    KNEE ARTHROSCOPY WITH MEDIAL MENISECTOMY Left 12/09/2014   Procedure: KNEE ARTHROSCOPY WITH PARTIAL MEDIAL AND LATERAL MENISECTOMY;  Surgeon: Lemond Stable, MD;  Location: [facility];  Service: Orthopedics;  Laterality: Left;  TUBAL LIGATION     WISDOM TOOTH EXTRACTION     Social History:  reports that she has never smoked. She has never used smokeless tobacco. She reports that she does not drink alcohol and does not use drugs.  Allergies   Allergen Reactions   Methylprednisolone  Itching   Venofer  [Iron  Sucrose] Rash    Family History  Problem Relation Age of Onset   Hypertension Mother    Lung cancer Father    Hypertension Brother    Colon cancer Neg Hx    Gastric cancer Neg Hx    Esophageal cancer Neg Hx     Prior to Admission medications   Medication Sig Start Date End Date Taking? Authorizing Provider  acetaminophen  (TYLENOL ) 500 MG tablet Take 1,000 mg by mouth every 6 (six) hours as needed for mild pain (pain score 1-3).    [provider]  amLODipine  (NORVASC ) 10 MG tablet Take 10 mg by mouth daily. 10/08/23   [provider]  buPROPion  (WELLBUTRIN  XL) 150 MG 24 hr tablet Take 1 tablet (150 mg total) by mouth daily. 08/20/23   Lavell Bari LABOR, FNP  busPIRone  (BUSPAR ) 5 MG tablet TAKE ONE TABLET UP TO THREE TIMES DAILY 01/06/24   Lavell Bari A, FNP  celecoxib  (CELEBREX ) 200 MG capsule Take by mouth 2 (two) times daily. 10/08/23   [provider]  escitalopram  (LEXAPRO ) 20 MG tablet Take 1 tablet (20 mg total) by mouth daily. 08/20/23   Lavell Bari LABOR, FNP  oxyCODONE  (ROXICODONE ) 5 MG immediate release tablet Take 1 tablet (5 mg total) by mouth every 6 (six) hours as needed for severe pain (pain score 7-10). 10/01/23   Francesca Elsie CROME, MD  pantoprazole  (PROTONIX ) 40 MG tablet TAKE ONE TABLET DAILY BEFORE BREAKFAST 10/08/23   Lavell Bari LABOR, FNP    Physical Exam: Vitals:   01/17/24 2030 01/17/24 2058 01/17/24 2100 01/17/24 2202  BP: 124/73  (!) 141/78 (!) 138/95  Pulse: 91  100 97  Resp: 20  18 20   Temp:  98.5 F (36.9 C)  98.4 F (36.9 C)  TempSrc:  Oral  Oral  SpO2: 91%  96% 96%  Weight:    90.7 kg  Height:    5' 7 (1.702 m)   General: Middle-aged female laying in bed in no acute distress CNS: Alert and oriented x 3 moving all extremities, however some speech abnormality CVS: S1-S2 present Respiratory: Clear to auscultation bilaterally Abdomen: Nontender moves with  respiration  Musculoskeletal: NAD Psych: Normal mood  Data Reviewed: I have reviewed patient chest x-ray that did not show any cardiopulmonary pathology CT angio of the chest did not show PE    Latest Ref Rng & Units 01/18/2024    2:28 AM 01/17/2024   12:55 PM 01/15/2024   10:39 AM  CBC  WBC 4.0 - 10.5 K/uL 9.6  6.8  6.6   Hemoglobin 12.0 - 15.0 g/dL 9.0  89.9  9.6   Hematocrit 36.0 - 46.0 % 28.1  31.1  30.3   Platelets 150 - 400 K/uL 245  256  235        Latest Ref Rng & Units 01/18/2024    2:29 AM 01/17/2024   12:55 PM 10/01/2023    1:01 PM  BMP  Glucose 70 - 99 mg/dL 865  94  98   BUN 6 - 20 mg/dL 15  11  14    Creatinine 0.44 - 1.00 mg/dL 9.09  9.15  9.24   Sodium 135 -  145 mmol/L 142  140  136   Potassium 3.5 - 5.1 mmol/L 4.0  4.1  3.6   Chloride 98 - 111 mmol/L 106  102  100   CO2 22 - 32 mmol/L 25  25  25    Calcium 8.9 - 10.3 mg/dL 9.5  89.1  89.5      Assessment and Plan:  Chest pain likely musculoskeletal Patient troponin 82 repeat 79 Denies ongoing chest pain CT angio ruled out PE Patient did have some tenderness upon palpation of the chest Monitor on telemetry Trend troponin Obtain echocardiogram According to cardiologist notes patient had a stress test that was reassuring If echo shows any abnormality to consult cardiology Continue as needed pain medication  Essential hypertension Continue amlodipine    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 Observation status is judged to be reasonable and necessary in order to provide the required intensity of service to ensure the  patient's safety. The patient's presenting symptoms, physical exam findings, and initial radiographic and laboratory data in the context of their medical condition is felt to place them at decreased risk for further clinical deterioration. Furthermore, it is anticipated that the patient will be medically stable for discharge from the hospital within 2 midnights of admission.   Author: Drue ONEIDA Potter, MD 01/17/2024 11:12 PM  For on call review [email or web address].

## 2024-01-17 NOTE — Patient Instructions (Signed)
 Medication Instructions:   Your physician recommends that you continue on your current medications as directed. Please refer to the Current Medication list given to you today.   *If you need a refill on your cardiac medications before your next appointment, please call your pharmacy*  Lab Work:  None ordered.  If you have labs (blood work) drawn today and your tests are completely normal, you will receive your results only by: MyChart Message (if you have MyChart) OR A paper copy in the mail If you have any lab test that is abnormal or we need to change your treatment, we will call you to review the results.  Testing/Procedures:  None ordered.  Follow-Up: At [facility], you and your health needs are our priority.  As part of our continuing mission to provide you with exceptional heart care, our providers are all part of one team.  This team includes your primary Cardiologist (physician) and Advanced Practice Providers or APPs (Physician Assistants and Nurse Practitioners) who all work together to provide you with the care you need, when you need it.  Your next appointment:   3 month(s)  Provider:   Shelda Bruckner, MD    We recommend signing up for the patient portal called MyChart.  Sign up information is provided on this After Visit Summary.  MyChart is used to connect with patients for Virtual Visits (Telemedicine).  Patients are able to view lab/test results, encounter notes, upcoming appointments, etc.  Non-urgent messages can be sent to your provider as well.   To learn more about what you can do with MyChart, go to [email or web address].   Other Instructions  Patient was sent to the ED today at [facility].

## 2024-01-17 NOTE — ED Notes (Signed)
 Patient transported to [facility] via [facility] at this time

## 2024-01-17 NOTE — Progress Notes (Addendum)
 Hospitalist Transfer Note:    Nursing staff, Please call [facility] Admits & Consults System-Wide number on Amion [phone]) as soon as patient's arrival, so appropriate admitting provider can evaluate the pt.   Transferring facility: [facility] Requesting provider: Alan Harari, PA (EDP at [facility]) Reason for transfer: admission for further evaluation and management of chest pain.    53  year old female who presented to [facility] ED complaining of 3 days of substernal nonradiating chest discomfort that has been nonexertional, nonpleuritic.  She continues to experience this chest discomfort at [facility], with this pain noted to be refractory to doses of IV morphine  IV fentanyl , IV Protonix  administered at [facility] today.  She has not had any nitroglycerin  thus far.  Her most recent echo is reported to have occurred in 2018.  Vital signs in the ED were notable for the following: Sinus tachycardia with rates in the 1 teens to 120s.  Labs were notable for initial troponin 82, with repeat troponin trending down to 79.   EKG is reported to show sinus tachycardia without overt evidence of acute ischemic changes, including no evidence of STEMI.  Imaging notable for CTA chest showed no evidence of acute cardiopulmonary process, including no evidence of acute pulmonary embolism.  EDP d/w on-call cardiology, Dr. Mona, who recommended obs to the hospitalists for further evaluation of presenting chest pain. Dr. Mona recommended pursuit of updated echocardiogram and conveyed that cardiology is available, as needed, if additional assistance is required.   Subsequently, I accepted this patient for transfer for observation to a cardiac telemetry bed at [facility] for further work-up and management of the above.      Eva Pore, DO Hospitalist

## 2024-01-17 NOTE — Progress Notes (Signed)
 EKG

## 2024-01-17 NOTE — ED Notes (Signed)
 Called [facility] to transport the patient to [facility] rm# 3

## 2024-01-17 NOTE — ED Triage Notes (Signed)
 Mid sternal chest pain  Started 3 days ago Worse when taking deep breath Had similar in past with w/u no cardiac findings

## 2024-01-17 NOTE — Progress Notes (Signed)
 Cardiology Office Note:  .   Date:  01/17/2024  ID:  Brittany Durham, DOB [birth year], MRN [id number] PCP: Lavell Bari LABOR, FNP  [facility] Providers Cardiologist:  Shelda Bruckner, MD {  History of Present Illness: .   Brittany Durham is a 53 y.o. female with a hx of atypical chest pain, hypertension, speech disorder, who is seen for follow up. I initially saw her 01/29/19 as a new consult at the request of Lavell Bari LABOR, FNP for the evaluation and management of chest pain.   Cardiac history:  Long history of sharp atypical chest pain without clear triggers. Has had multiple   Today: Having active chest pain for the last 4 days. Declines ECG today. Nearly constant, sharp, leaves for 1-2 minutes and then comes back. Note related to food. Only a little better with tylenol . No fevers/chills/cough. No clear position that helps. Hard to take in a deep breath. She is able to sleep. Had not had an episode since when she went to the ER in May of this year. Workup there was unremarkable other than elevated d dimer, normal hsTn and CTPE. Entire episode that time lasted a week and a half.   We discussed options, including ER evaluation. She is amenable. I called report to ER physician.  ROS: Denies PND, orthopnea, LE edema or unexpected weight gain. No syncope or palpitations. ROS otherwise negative except as noted.   Studies Reviewed: SABRA    EKG:     declines ECG today as she is going to ER  Physical Exam:   VS:  BP 130/82 (BP Location: Right Arm, Patient Position: Sitting)   Pulse 71   Resp 17   Ht 5' 7 (1.702 m)   Wt 200 lb (90.7 kg)   LMP 09/16/2017   SpO2 98%   BMI 31.32 kg/m    Wt Readings from Last 3 Encounters:  01/17/24 200 lb (90.7 kg)  12/31/23 200 lb (90.7 kg)  10/17/23 195 lb (88.5 kg)    GEN: Well nourished, well developed in no acute distress HEENT: Normal, moist mucous membranes NECK: No JVD CARDIAC: regular rhythm, normal S1 and S2, no rubs or gallops. No  murmur. VASCULAR: Radial and DP pulses 2+ bilaterally. No carotid bruits RESPIRATORY:  Clear to auscultation without rales, wheezing or rhonchi  ABDOMEN: Soft, non-tender, non-distended MUSCULOSKELETAL:  Ambulates independently SKIN: Warm and dry, no edema NEUROLOGIC:  Alert and oriented x 3. No focal neuro deficits noted. PSYCHIATRIC:  Normal affect    ASSESSMENT AND PLAN: .    Active chest pain of unclear etiology Atypical chest pain history chronically  -has history of chronic pain, flares intermittently, no clear triggers -significant pain today. Not clearly tender on palpation, not related to eating. Slightly pleuritic component. Cannot exclude cardiac etiology. We discussed ER evaluation, both to rule out high risk condition and also hopefully for some symptom relief if no high risk etiology found -at ER visit several months ago, hsTn unremarkable but d dimer elevated. CTPE was negative. Reviewed her test results with her -we have discussed noncardiac etiologies -stress test reassuring.  -Instructed on red flag signs that need immediate medical attention.    Hypertension -continue amlodipine , lisinopril   History of palpitations History of syncope -no recent symptoms  CV risk counseling and prevention -recommend heart healthy/Mediterranean diet, with whole grains, fruits, vegetable, fish, lean meats, nuts, and olive oil. Limit salt. -recommend moderate walking, 3-5 times/week for 30-50 minutes each session. Aim for at least 150 minutes/week. Goal  should be pace of 3 miles/hours, or walking 1.5 miles in 30 minutes -recommend avoidance of tobacco products. Avoid excess alcohol. -ASCVD risk score: The 10-year ASCVD risk score (Arnett DK, et al., 2019) is: 8.6%   Values used to calculate the score:     Age: 47 years     Clincally relevant sex: Female     Is Non-Hispanic African American: Yes     Diabetic: No     Tobacco smoker: No     Systolic Blood Pressure: 152 mmHg     Is  BP treated: Yes     HDL Cholesterol: 52 mg/dL     Total Cholesterol: 184 mg/dL    Dispo: 3 months or sooner as needed, if ER evaluation does not show cardiac etiology  High risk medical condition given active chest pain, required urgent transfer to ER for further evaluation  Signed, Shelda Bruckner, MD   Shelda Bruckner, MD, PhD, [facility]  [facility]  [place]  Heart & Vascular at [facility] at [facility] [address], [address] [place], [place] [address] [phone]

## 2024-01-17 NOTE — ED Provider Notes (Signed)
 [facility] Provider Note   CSN: [id number] Arrival date & time: 01/17/24  1237     Patient presents with: Chest Pain   Brittany Durham is a 53 y.o. female with history of hiatal hernia, chronic iron  deficiency anemia, presents with concern for substernal chest pain that has been ongoing for 3 days.  States it is mostly constant, but will subside for about 1 to 2 minutes at a time occasionally.  Pain is nonexertional.  She denies any associated shortness of breath.  Denies any pleuritic chest pain.  Denies any pain that radiates to the back.  She denies any abdominal pain, nausea, or vomiting.  No cough, fevers, or chills.  She follows with Dr. Dawna Bruckner with cardiology for history of atypical chest pain and hypertension    Chest Pain      Prior to Admission medications   Medication Sig Start Date End Date Taking? Authorizing Provider  acetaminophen  (TYLENOL ) 500 MG tablet Take 1,000 mg by mouth every 6 (six) hours as needed for mild pain (pain score 1-3).    [provider]  amLODipine  (NORVASC ) 10 MG tablet Take 10 mg by mouth daily. 10/08/23   [provider]  buPROPion  (WELLBUTRIN  XL) 150 MG 24 hr tablet Take 1 tablet (150 mg total) by mouth daily. 08/20/23   Lavell Bari LABOR, FNP  busPIRone  (BUSPAR ) 5 MG tablet TAKE ONE TABLET UP TO THREE TIMES DAILY 01/06/24   Lavell Bari A, FNP  celecoxib  (CELEBREX ) 200 MG capsule Take by mouth 2 (two) times daily. 10/08/23   [provider]  escitalopram  (LEXAPRO ) 20 MG tablet Take 1 tablet (20 mg total) by mouth daily. 08/20/23   Lavell Bari LABOR, FNP  oxyCODONE  (ROXICODONE ) 5 MG immediate release tablet Take 1 tablet (5 mg total) by mouth every 6 (six) hours as needed for severe pain (pain score 7-10). 10/01/23   Francesca Elsie CROME, MD  pantoprazole  (PROTONIX ) 40 MG tablet TAKE ONE TABLET DAILY BEFORE BREAKFAST 10/08/23   Lavell Bari A, FNP    Allergies:  Methylprednisolone  and Venofer  [iron  sucrose]    Review of Systems  Cardiovascular:  Positive for chest pain.    Updated Vital Signs BP 109/71 (BP Location: Right Arm)   Pulse 98   Temp 98.5 F (36.9 C) (Oral)   Resp 19   Ht 5' 7 (1.702 m)   Wt 90.7 kg   LMP 09/16/2017   SpO2 93%   BMI 31.32 kg/m   Physical Exam Vitals and nursing note reviewed.  Constitutional:      General: She is not in acute distress.    Appearance: She is well-developed.  HENT:     Head: Normocephalic and atraumatic.  Eyes:     Conjunctiva/sclera: Conjunctivae normal.  Cardiovascular:     Rate and Rhythm: Regular rhythm. Tachycardia present.     Heart sounds: No murmur heard. Pulmonary:     Effort: Pulmonary effort is normal. No respiratory distress.     Breath sounds: Normal breath sounds.  Abdominal:     Palpations: Abdomen is soft.     Tenderness: There is no abdominal tenderness.  Musculoskeletal:        General: No swelling.     Cervical back: Neck supple.  Skin:    General: Skin is warm and dry.     Capillary Refill: Capillary refill takes less than 2 seconds.  Neurological:     Mental Status: She is alert.  Psychiatric:  Mood and Affect: Mood normal.     (all labs ordered are listed, but only abnormal results are displayed) Labs Reviewed  BASIC METABOLIC PANEL WITH GFR - Abnormal; Notable for the following components:      Result Value   Calcium 10.8 (*)    All other components within normal limits  CBC - Abnormal; Notable for the following components:   RBC 3.46 (*)    Hemoglobin 10.0 (*)    HCT 31.1 (*)    All other components within normal limits  D-DIMER, QUANTITATIVE - Abnormal; Notable for the following components:   D-Dimer, Quant 10.85 (*)    All other components within normal limits  TROPONIN T, HIGH SENSITIVITY - Abnormal; Notable for the following components:   Troponin T High Sensitivity 82 (*)    All other components within normal limits  TROPONIN T,  HIGH SENSITIVITY - Abnormal; Notable for the following components:   Troponin T High Sensitivity 79 (*)    All other components within normal limits  MRSA NEXT GEN BY PCR, NASAL  HIV ANTIBODY (ROUTINE TESTING W REFLEX)  CBC  CREATININE, SERUM  BASIC METABOLIC PANEL WITH GFR  CBC    EKG: None  Radiology: CT Angio Chest PE W and/or Wo Contrast Result Date: 01/17/2024 CLINICAL DATA:  Positive D-dimer, central chest pain for 2-3 days EXAM: CT ANGIOGRAPHY CHEST WITH CONTRAST TECHNIQUE: Multidetector CT imaging of the chest was performed using the standard protocol during bolus administration of intravenous contrast. Multiplanar CT image reconstructions and MIPs were obtained to evaluate the vascular anatomy. RADIATION DOSE REDUCTION: This exam was performed according to the departmental dose-optimization program which includes automated exposure control, adjustment of the mA and/or kV according to patient size and/or use of iterative reconstruction technique. CONTRAST:  75mL OMNIPAQUE  IOHEXOL  350 MG/ML SOLN COMPARISON:  01/17/2024, 10/01/2023 FINDINGS: Cardiovascular: This is a technically adequate evaluation of the pulmonary vasculature. No filling defects or pulmonary emboli. The heart is unremarkable without pericardial effusion. No evidence of thoracic aortic aneurysm or dissection. Stable atherosclerosis of the aorta. Mediastinum/Nodes: No enlarged mediastinal, hilar, or axillary lymph nodes. Thyroid  gland, trachea, and esophagus demonstrate no significant findings. Stable hiatal hernia. Lungs/Pleura: No acute airspace disease, effusion, or pneumothorax. Central airways are patent. Upper Abdomen: No acute abnormality. Musculoskeletal: No acute or destructive bony abnormalities. Reconstructed images demonstrate no additional findings. Review of the MIP images confirms the above findings. IMPRESSION: 1. No evidence of pulmonary embolus. 2. No acute intrathoracic process. 3. Stable hiatal hernia. 4.   Aortic Atherosclerosis (ICD10-I70.0). Electronically Signed   By: Ozell Daring M.D.   On: 01/17/2024 17:31   DG Chest 2 View Result Date: 01/17/2024 CLINICAL DATA:  chest pain EXAM: CHEST - 2 VIEW COMPARISON:  Oct 01, 2023 FINDINGS: No focal airspace consolidation, pleural effusion, or pneumothorax. No cardiomegaly. Tortuous aorta with aortic atherosclerosis. No acute fracture or destructive lesions. Mild dextrocurvature of the thoracic spine with multilevel degenerative disc disease. Large hiatal hernia. IMPRESSION: No acute cardiopulmonary abnormality. Electronically Signed   By: Rogelia Myers M.D.   On: 01/17/2024 14:20     Procedures   Medications Ordered in the ED  enoxaparin  (LOVENOX ) injection 40 mg (has no administration in time range)  morphine  (PF) 2 MG/ML injection 2 mg (has no administration in time range)  acetaminophen  (TYLENOL ) tablet 650 mg (has no administration in time range)  pantoprazole  (PROTONIX ) injection 40 mg (40 mg Intravenous Given 01/17/24 1528)  fentaNYL  (SUBLIMAZE ) injection 50 mcg (50 mcg Intravenous Given  01/17/24 1531)  morphine  (PF) 4 MG/ML injection 4 mg (4 mg Intravenous Given 01/17/24 1712)  ondansetron  (ZOFRAN ) injection 4 mg (4 mg Intravenous Given 01/17/24 1711)  iohexol  (OMNIPAQUE ) 350 MG/ML injection 75 mL (75 mLs Intravenous Contrast Given 01/17/24 1720)  HYDROmorphone  (DILAUDID ) injection 1 mg (1 mg Intravenous Given 01/17/24 2129)    Clinical Course as of 01/18/24 0059  Fri Jan 17, 2024  1857 Spoke with Dr. Mona with cardiology.  He recommended admission with the hospitalist service and echocardiogram inpatient.  If abnormal, hospitalist can consult cardiology at that point. He also thought that patient may benefit from a rheumatology workup.  [AF]  1949 Spoke to Dr. Marcene with the hospital service, he recommends admission to [facility] [AF]    Clinical Course User Index [AF] Veta Palma, PA-C                                 Medical  Decision Making Amount and/or Complexity of Data Reviewed Labs: ordered. Radiology: ordered.  Risk Prescription drug management. Decision regarding hospitalization.     Differential diagnosis includes but is not limited to ACS, arrhythmia, aortic aneurysm, pericarditis, myocarditis, pericardial effusion, cardiac tamponade, musculoskeletal pain, GERD, Boerhaave's syndrome, DVT/PE, pneumonia, pleural effusion   ED Course:  Upon initial evaluation, patient is well-appearing, no acute distress.  Stable vitals aside from a mild tachycardia to about 100-105.  She is reporting substernal chest pain that has been ongoing for the past 3 days.  On cardiac auscultation, no murmurs heard.  Lungs clear to auscultation bilaterally.  Abdomen is soft and nontender.   Labs Ordered: I Ordered, and personally interpreted labs.  The pertinent results include:   CBC with hemoglobin at 10, consistent with baseline and known history of iron  deficiency anemia but down from 3 months ago at 12.8 BMP within normal limits Initial troponin elevated at 82, repeat 79 D-dimer elevated at 10.85  Imaging Studies ordered: I ordered imaging studies including chest x-ray, CTA chest I independently visualized the imaging with scope of interpretation limited to determining acute life threatening conditions related to emergency care. Imaging showed Chest x-ray without acute abnormality, known hiatal hernia appreciated CTA chest without evidence of PE I agree with the radiologist interpretation   Cardiac Monitoring: / EKG: The patient was maintained on a cardiac monitor.  I personally viewed and interpreted the cardiac monitored which showed an underlying rhythm of: Sinus tachycardia to 103   Consultations Obtained: I requested consultation with the cardiologist Dr. Mona,  and discussed lab and imaging findings as well as pertinent plan - they recommend: Admission to medicine service.  He does not think patient's  chest pain and elevated troponin is cardiac in nature at this time, but did recommend beginning with an echocardiogram inpatient as she has not had a recent echo.  He asked to be reconsulted if any abnormal findings on echo, or further concerns.  I requested consultation with the hospitalist Dr. Marcene,  and discussed lab and imaging findings as well as pertinent plan - they recommend: Admission  Medications Given: Fentanyl  Morphine  Dilaudid  Zofran  Protonix   Upon re-evaluation, patient remains well-appearing, tachycardia has improved slightly to the 90s.  Blood pressure still remains slightly elevated at 152/96.   She reports that her chest pain has not been well-controlled with the medications given here today.  Low concern for ACS at this time given troponin remains stable, although elevated, with initial troponin of  82 and repeat of 79, pain non-exertional, and EKG with normal sinus rhythm and no ST changes. Chest x-ray without any acute abnormality, her known hiatal hernia was seen.  D-dimer was elevated at 10.8, but CTA without any evidence of pulmonary embolism or other vascular abnormality such as aneurysm to explain her pain. She was evaluated for chest pain in May of this year, and troponins at that time were within normal limits.  Her troponins in the 70s and 80s today are abnormal for her.  I discussed this with my attending, Dr. Simon, and we feel patient would benefit from observation overnight and further cardiac workup inpatient.  She has not had any recent echoes, most recent echocardiogram was in 2018.  Has not had coronary calcium scan.  Patient is in agreement with this plan.   Impression: Chest Pain Elevated troponin  Disposition:  Admission with hospitalist Dr. Marcene    Record Review: External records from outside source obtained and reviewed including cardiology note from 01/17/2024 with Dr. Lonni.  Most recent echocardiogram was done in 2018 which was  unremarkable     This chart was dictated using voice recognition software, Dragon. Despite the best efforts of this provider to proofread and correct errors, errors may still occur which can change documentation meaning.       Final diagnoses:  Chest pain, unspecified type  Elevated troponin    ED Discharge Orders     None          Veta Palma, PA-C 01/18/24 0059    Simon Lavonia SAILOR, MD 01/20/24 1039

## 2024-01-18 ENCOUNTER — Observation Stay (HOSPITAL_BASED_OUTPATIENT_CLINIC_OR_DEPARTMENT_OTHER)

## 2024-01-18 DIAGNOSIS — R0789 Other chest pain: Secondary | ICD-10-CM | POA: Diagnosis not present

## 2024-01-18 DIAGNOSIS — I10 Essential (primary) hypertension: Secondary | ICD-10-CM | POA: Diagnosis not present

## 2024-01-18 DIAGNOSIS — R079 Chest pain, unspecified: Secondary | ICD-10-CM | POA: Diagnosis not present

## 2024-01-18 DIAGNOSIS — R479 Unspecified speech disturbances: Secondary | ICD-10-CM | POA: Diagnosis not present

## 2024-01-18 DIAGNOSIS — D649 Anemia, unspecified: Secondary | ICD-10-CM | POA: Diagnosis not present

## 2024-01-18 DIAGNOSIS — J45909 Unspecified asthma, uncomplicated: Secondary | ICD-10-CM | POA: Diagnosis not present

## 2024-01-18 DIAGNOSIS — Z79899 Other long term (current) drug therapy: Secondary | ICD-10-CM | POA: Diagnosis not present

## 2024-01-18 DIAGNOSIS — J449 Chronic obstructive pulmonary disease, unspecified: Secondary | ICD-10-CM | POA: Diagnosis not present

## 2024-01-18 LAB — ECHOCARDIOGRAM COMPLETE
Height: 67 in
S' Lateral: 3.3 cm
Weight: 3199.32 [oz_av]

## 2024-01-18 LAB — CBC
HCT: 28.1 % — ABNORMAL LOW (ref 36.0–46.0)
Hemoglobin: 9 g/dL — ABNORMAL LOW (ref 12.0–15.0)
MCH: 29.3 pg (ref 26.0–34.0)
MCHC: 32 g/dL (ref 30.0–36.0)
MCV: 91.5 fL (ref 80.0–100.0)
Platelets: 245 K/uL (ref 150–400)
RBC: 3.07 MIL/uL — ABNORMAL LOW (ref 3.87–5.11)
RDW: 13.5 % (ref 11.5–15.5)
WBC: 9.6 K/uL (ref 4.0–10.5)
nRBC: 0 % (ref 0.0–0.2)

## 2024-01-18 LAB — BASIC METABOLIC PANEL WITH GFR
Anion gap: 11 (ref 5–15)
BUN: 15 mg/dL (ref 6–20)
CO2: 25 mmol/L (ref 22–32)
Calcium: 9.5 mg/dL (ref 8.9–10.3)
Chloride: 106 mmol/L (ref 98–111)
Creatinine, Ser: 0.9 mg/dL (ref 0.44–1.00)
GFR, Estimated: >60 mL/min (ref >60)
Glucose, Bld: 134 mg/dL — ABNORMAL HIGH (ref 70–99)
Potassium: 4 mmol/L (ref 3.5–5.1)
Sodium: 142 mmol/L (ref 135–145)

## 2024-01-18 LAB — HIV ANTIBODY (ROUTINE TESTING W REFLEX): HIV Screen 4th Generation wRfx: NONREACTIVE

## 2024-01-18 LAB — TROPONIN I (HIGH SENSITIVITY)
Troponin I (High Sensitivity): 7 ng/L (ref <18)
Troponin I (High Sensitivity): 6 ng/L (ref <18)

## 2024-01-18 MED ORDER — BUPROPION HCL ER (XL) 150 MG PO TB24
150.0000 mg | ORAL_TABLET | Freq: Every day | ORAL | Status: DC
  Administered 2024-01-18 – 2024-01-19 (×2): 150 mg via ORAL
  Filled 2024-01-18 (×2): qty 1

## 2024-01-18 MED ORDER — ESCITALOPRAM OXALATE 10 MG PO TABS
20.0000 mg | ORAL_TABLET | Freq: Every day | ORAL | Status: DC
Start: 2024-01-18 — End: 2024-01-19
  Administered 2024-01-18 – 2024-01-19 (×2): 20 mg via ORAL
  Filled 2024-01-18 (×2): qty 2

## 2024-01-18 MED ORDER — PANTOPRAZOLE SODIUM 40 MG PO TBEC
40.0000 mg | DELAYED_RELEASE_TABLET | Freq: Every day | ORAL | Status: DC
  Administered 2024-01-18 – 2024-01-19 (×2): 40 mg via ORAL
  Filled 2024-01-18 (×2): qty 1

## 2024-01-18 MED ORDER — BUSPIRONE HCL 10 MG PO TABS
5.0000 mg | ORAL_TABLET | Freq: Two times a day (BID) | ORAL | Status: DC
  Administered 2024-01-18 – 2024-01-19 (×3): 5 mg via ORAL
  Filled 2024-01-18 (×3): qty 1

## 2024-01-18 MED ORDER — OXYCODONE HCL 5 MG PO TABS
5.0000 mg | ORAL_TABLET | Freq: Four times a day (QID) | ORAL | Status: DC | PRN
  Administered 2024-01-18 – 2024-01-19 (×2): 5 mg via ORAL
  Filled 2024-01-18 (×2): qty 1

## 2024-01-18 MED ORDER — MORPHINE SULFATE (PF) 2 MG/ML IV SOLN
2.0000 mg | INTRAVENOUS | Status: DC | PRN
  Administered 2024-01-18: 2 mg via INTRAVENOUS
  Filled 2024-01-18: qty 1

## 2024-01-18 MED ORDER — AMLODIPINE BESYLATE 10 MG PO TABS
10.0000 mg | ORAL_TABLET | Freq: Every day | ORAL | Status: DC
Start: 2024-01-18 — End: 2024-01-19
  Administered 2024-01-18 – 2024-01-19 (×2): 10 mg via ORAL
  Filled 2024-01-18 (×2): qty 1

## 2024-01-18 MED ORDER — KETOROLAC TROMETHAMINE 30 MG/ML IJ SOLN
30.0000 mg | Freq: Once | INTRAMUSCULAR | Status: AC
  Administered 2024-01-18: 30 mg via INTRAVENOUS
  Filled 2024-01-18: qty 1

## 2024-01-18 MED ORDER — ACETAMINOPHEN 325 MG PO TABS
650.0000 mg | ORAL_TABLET | Freq: Four times a day (QID) | ORAL | Status: DC | PRN
  Administered 2024-01-18 – 2024-01-19 (×2): 650 mg via ORAL
  Filled 2024-01-18 (×2): qty 2

## 2024-01-18 NOTE — Progress Notes (Signed)
 Progress Note   Patient: Brittany Durham FMW:[id number] DOB: 1970-07-20 DOA: 01/17/2024     0 DOS: the patient was seen and examined on 01/18/2024   Brief hospital course: 53 y.o. female with medical history significant of hypertension, speech abnormality, COPD, GERD, who follows up with cardiology on account of longstanding history of chest pain.  According to patient her chest pain became more pronounced within the last few days not related to exertion.  She followed up with her cardiologist today and was told to come to the emergency room for further management.  Patient presented to [facility] and ED physician discussed with cardiologist Dr. Mona who recommended patient be admitted under hospitalist service for further evaluation and to obtain echocardiogram and conveyed that cardiologist is available if additional assistance is needed.  By the time patient was being seen she denied any active ongoing chest pain, nausea or vomiting, abdominal pain, cough or urinary complaints.   ED course: Upon arrival to the emergency room temperature 97.7 respiratory rate 18 pulse 101 blood pressure 138/88 saturating 100% on room air EKG shows sinus tachycardia with no signs of ischemia  Assessment and Plan: Chest pain likely musculoskeletal Patient troponin 82 repeat 79, not suggestive of ischeamia Elevated Dimer-CT angio ruled out PE Patient did have some tenderness upon palpation of the chest Echocardiogram done, await read According to cardiologist notes patient had a stress test that was reassuring If echo shows any abnormality to consult cardiology Continue as needed pain medication Cardiology following   Essential hypertension Continue amlodipine     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 Multiplanar CT image reconstructions and MIPs were obtained to evaluate the vascular anatomy.   RADIATION DOSE REDUCTION: This exam was performed according to the departmental dose-optimization program which includes automated exposure control, adjustment of the mA and/or kV according to patient size and/or use of iterative reconstruction technique.   CONTRAST:  75mL OMNIPAQUE  IOHEXOL  350 MG/ML SOLN   COMPARISON:  01/17/2024, 10/01/2023   FINDINGS: Cardiovascular: This is a technically adequate evaluation of the pulmonary vasculature. No filling defects or pulmonary emboli.   The heart is unremarkable without pericardial effusion. No evidence of thoracic aortic aneurysm or dissection. Stable atherosclerosis of the aorta.   Mediastinum/Nodes: No enlarged mediastinal, hilar, or axillary lymph nodes. Thyroid  gland, trachea, and esophagus demonstrate no significant findings. Stable hiatal hernia.   Lungs/Pleura: No acute airspace disease, effusion, or pneumothorax. Central airways are patent.   Upper Abdomen: No acute abnormality.   Musculoskeletal: No acute or destructive bony abnormalities. Reconstructed images demonstrate no additional findings.   Review of the MIP images confirms the above findings.   IMPRESSION: 1. No evidence of pulmonary embolus. 2. No acute intrathoracic process. 3. Stable hiatal hernia. 4.  Aortic Atherosclerosis (ICD10-I70.0).     Electronically Signed   By: Ozell Daring M.D.   On: 01/17/2024 17:31     Subjective: Had chest pain this morning, improved with Toradol  and dilaudid . She feels as though her heart is racing, denies dyspnea, nausea, vomiting.  Physical Exam: Vitals:   01/17/24 2202 01/17/24 2328 01/18/24 0415 01/18/24 0737  BP: (!) 138/95 109/71 (!) 128/94 128/83  Pulse: 97  98  97 (!) 108  Resp: 20 19 15 17   Temp: 98.4 F (36.9 C) 98.5 F (36.9 C) 97.7 F (36.5 C) 97.6 F (36.4 C)  TempSrc: Oral Oral Oral Oral  SpO2: 96% 93% 91% 96%  Weight: 90.7 kg     Height: 5' 7 (1.702 m)     General: Middle-aged female laying in bed in no acute distress CNS: Alert and oriented x 3 moving all extremities, however some speech abnormality CVS: S1-S2 present Respiratory: Clear to auscultation bilaterally Abdomen: Nontender moves with respiration  Musculoskeletal: NAD Psych: Normal mood Data Reviewed: Reviewed labs and imaging  Family Communication: None at bedside  Disposition: Status is: Observation The patient will require care spanning > 2 midnights and should be moved to inpatient because: Work up pending  Planned Discharge Destination: Home    Time spent: 35 minutes  Author: Landon FORBES Baller, MD 01/18/2024 11:53 AM  For on call review [email or web address].

## 2024-01-18 NOTE — Progress Notes (Signed)
  Echocardiogram 2D Echocardiogram has been performed.  Brittany Durham 01/18/2024, 11:42 AM

## 2024-01-18 NOTE — Plan of Care (Signed)

## 2024-01-18 NOTE — Care Management Obs Status (Signed)
 MEDICARE OBSERVATION STATUS NOTIFICATION   Patient Details  Name: Brittany Durham MRN: [id number] Date of Birth: March 31, 1971   Medicare Observation Status Notification Given:  Yes    Jon Cruel 01/18/2024, 11:00 AM

## 2024-01-18 NOTE — Plan of Care (Signed)

## 2024-01-18 NOTE — Plan of Care (Signed)
  Problem: Education: Goal: Knowledge of General Education information will improve Description: Including pain rating scale, medication(s)/side effects and non-pharmacologic comfort measures Outcome: Progressing   Problem: Clinical Measurements: Goal: Will remain free from infection Outcome: Progressing Goal: Diagnostic test results will improve Outcome: Progressing Goal: Respiratory complications will improve Outcome: Progressing Goal: Cardiovascular complication will be avoided Outcome: Progressing   Problem: Activity: Goal: Risk for activity intolerance will decrease Outcome: Progressing   Problem: Coping: Goal: Level of anxiety will decrease Outcome: Progressing   Problem: Pain Managment: Goal: General experience of comfort will improve and/or be controlled Outcome: Progressing

## 2024-01-19 DIAGNOSIS — R0789 Other chest pain: Secondary | ICD-10-CM | POA: Diagnosis not present

## 2024-01-19 NOTE — Discharge Summary (Signed)
 Physician Discharge Summary   Patient: Brittany Durham MRN: [id number] DOB: 1970-07-23  Admit date:     01/17/2024  Discharge date: 01/19/24  Discharge Physician: Landon BRAVO Shirell Struthers   PCP: Lavell Bari LABOR, FNP   Recommendations at discharge:    Follow up with Cardiology, PCP  Discharge Diagnoses: Principal Problem:   Atypical chest pain Active Problems:   Chest pain  Resolved Problems:   * No resolved hospital problems. *  Hospital Course: 53 y.o. female with medical history significant of hypertension, speech abnormality, COPD, GERD, who follows up with cardiology on account of longstanding history of chest pain.  According to patient her chest pain became more pronounced within the last few days not related to exertion.  She followed up with her cardiologist today and was told to come to the emergency room for further management.  Patient presented to [facility] room and ED physician discussed with cardiologist Dr. Mona who recommended patient be admitted under hospitalist service for further evaluation and to obtain echocardiogram and conveyed that cardiologist is available if additional assistance is needed.   Echo obtained showed no valvular abnormalities, EF of 60% CTA shows no PE/ no acute intrathoracic process. 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   amLODipine  10 MG tablet Commonly known as: NORVASC  Take 10 mg by mouth daily.   baclofen  10 MG tablet Commonly known as: LIORESAL  Take 10 mg by mouth 2 (two) times daily.   buPROPion  150 MG 24 hr tablet Commonly known as: WELLBUTRIN  XL Take 1 tablet (150 mg total) by mouth daily.   busPIRone  5 MG tablet Commonly known as: BUSPAR  TAKE ONE TABLET UP TO THREE TIMES DAILY   celecoxib  200 MG capsule Commonly known as: CELEBREX  Take by mouth 2 (two) times daily.   escitalopram  20 MG tablet Commonly known as: LEXAPRO  Take 1 tablet (20 mg total) by mouth daily.   pantoprazole  40 MG tablet Commonly known as: PROTONIX  TAKE ONE TABLET DAILY BEFORE BREAKFAST        Discharge Exam: Filed Weights   01/17/24 2202  Weight: 90.7 kg  General: Middle-aged female laying in bed in no acute distress CNS: Alert and oriented x 3 moving all extremities, however some speech abnormality CVS: S1-S2 present Respiratory: Clear to auscultation bilaterally Abdomen: Nontender moves with respiration  Musculoskeletal: NAD Psych: Normal mood   Condition at discharge: fair  The results of significant diagnostics from this  hospitalization (including imaging, microbiology, ancillary and laboratory) are listed below for reference.   Imaging Studies: ECHOCARDIOGRAM COMPLETE Result Date: 01/18/2024    ECHOCARDIOGRAM REPORT   Patient Name:   Brittany Durham Date of Exam: 01/18/2024 Medical Rec #:  [id number]     Height:       67.0 in Accession #:    [id number]    Weight:       200.0  lb Date of Birth:  1971/04/16      BSA:          2.022 m Patient Age:    53 years      BP:           128/83 mmHg Patient Gender: F             HR:           95 bpm. Exam Location:  Inpatient Procedure: 2D Echo (Both Spectral and Color Flow Doppler were utilized during            procedure). Indications:    chest pain  History:        Patient has prior history of Echocardiogram examinations, most                 recent 09/21/2016. COPD; Risk Factors:Hypertension.  Sonographer:    Tinnie Barefoot RDCS Referring Phys: [phone] PRINCE T DJAN IMPRESSIONS  1. Left ventricular ejection fraction, by estimation, is 60 to 65%. The left ventricle has normal function. The left ventricle has no regional wall motion abnormalities. Left ventricular diastolic parameters are indeterminate.  2. Right ventricular systolic function is normal. The right ventricular size is normal.  3. The mitral valve is normal in structure. No evidence of mitral valve regurgitation. No evidence of mitral stenosis.  4. The aortic valve is tricuspid. Aortic valve regurgitation is not visualized. No aortic stenosis is present.  5. The inferior vena cava is normal in size with greater than 50% respiratory variability, suggesting right atrial pressure of 3 mmHg. FINDINGS  Left Ventricle: Left ventricular ejection fraction, by estimation, is 60 to 65%. The left ventricle has normal function. The left ventricle has no regional wall motion abnormalities. The left ventricular internal cavity size was normal in size. There is  no left ventricular hypertrophy. Left ventricular diastolic parameters are indeterminate. Right Ventricle: The right ventricular size is normal. Right ventricular systolic function is normal. Left Atrium: Left atrial size was normal in size. Right Atrium: Right atrial size was normal in size. Pericardium: There is no evidence of pericardial effusion. Mitral Valve: The mitral valve is normal in structure. No evidence of mitral valve  regurgitation. No evidence of mitral valve stenosis. Tricuspid Valve: The tricuspid valve is normal in structure. Tricuspid valve regurgitation is trivial. No evidence of tricuspid stenosis. Aortic Valve: The aortic valve is tricuspid. Aortic valve regurgitation is not visualized. No aortic stenosis is present. Pulmonic Valve: The pulmonic valve was normal in structure. Pulmonic valve regurgitation is not visualized. No evidence of pulmonic stenosis. Aorta: The aortic root is normal in size and structure. Venous: The inferior vena cava is normal in size with greater than 50% respiratory variability, suggesting right atrial pressure of 3 mmHg. IAS/Shunts: No atrial level shunt detected by color flow Doppler.  LEFT VENTRICLE PLAX 2D LVIDd:         5.00 cm   Diastology LVIDs:         3.30 cm   LV e' lateral:   10.40 cm/s LV PW:  0.90 cm   LV E/e' lateral: 9.3 LV IVS:        1.00 cm LVOT diam:     1.90 cm LV SV:         71 LV SV Index:   35 LVOT Area:     2.84 cm  RIGHT VENTRICLE             IVC RV Basal diam:  2.80 cm     IVC diam: 1.40 cm RV S prime:     18.70 cm/s TAPSE (M-mode): 2.9 cm LEFT ATRIUM             Index        RIGHT ATRIUM           Index LA diam:        3.90 cm 1.93 cm/m   RA Area:     15.90 cm LA Vol (A2C):   54.5 ml 26.95 ml/m  RA Volume:   42.00 ml  20.77 ml/m LA Vol (A4C):   46.4 ml 22.95 ml/m LA Biplane Vol: 50.5 ml 24.97 ml/m  AORTIC VALVE LVOT Vmax:   147.00 cm/s LVOT Vmean:  95.400 cm/s LVOT VTI:    0.249 m  AORTA Ao Root diam: 2.90 cm Ao Asc diam:  3.20 cm MV E velocity: 96.80 cm/s MV A velocity: 128.00 cm/s  SHUNTS MV E/A ratio:  0.76         Systemic VTI:  0.25 m                             Systemic Diam: 1.90 cm Redell Shallow MD Electronically signed by Redell Shallow MD Signature Date/Time: 01/18/2024/1:28:16 PM    Final    CT Angio Chest PE W and/or Wo Contrast Result Date: 01/17/2024 CLINICAL DATA:  Positive D-dimer, central chest pain for 2-3 days EXAM: CT ANGIOGRAPHY  CHEST WITH CONTRAST TECHNIQUE: Multidetector CT imaging of the chest was performed using the standard protocol during bolus administration of intravenous contrast. Multiplanar CT image reconstructions and MIPs were obtained to evaluate the vascular anatomy. RADIATION DOSE REDUCTION: This exam was performed according to the departmental dose-optimization program which includes automated exposure control, adjustment of the mA and/or kV according to patient size and/or use of iterative reconstruction technique. CONTRAST:  75mL OMNIPAQUE  IOHEXOL  350 MG/ML SOLN COMPARISON:  01/17/2024, 10/01/2023 FINDINGS: Cardiovascular: This is a technically adequate evaluation of the pulmonary vasculature. No filling defects or pulmonary emboli. The heart is unremarkable without pericardial effusion. No evidence of thoracic aortic aneurysm or dissection. Stable atherosclerosis of the aorta. Mediastinum/Nodes: No enlarged mediastinal, hilar, or axillary lymph nodes. Thyroid  gland, trachea, and esophagus demonstrate no significant findings. Stable hiatal hernia. Lungs/Pleura: No acute airspace disease, effusion, or pneumothorax. Central airways are patent. Upper Abdomen: No acute abnormality. Musculoskeletal: No acute or destructive bony abnormalities. Reconstructed images demonstrate no additional findings. Review of the MIP images confirms the above findings. IMPRESSION: 1. No evidence of pulmonary embolus. 2. No acute intrathoracic process. 3. Stable hiatal hernia. 4.  Aortic Atherosclerosis (ICD10-I70.0). Electronically Signed   By: Ozell Daring M.D.   On: 01/17/2024 17:31   DG Chest 2 View Result Date: 01/17/2024 CLINICAL DATA:  chest pain EXAM: CHEST - 2 VIEW COMPARISON:  Oct 01, 2023 FINDINGS: No focal airspace consolidation, pleural effusion, or pneumothorax. No cardiomegaly. Tortuous aorta with aortic atherosclerosis. No acute fracture or destructive lesions. Mild dextrocurvature of the thoracic spine with multilevel  degenerative  disc disease. Large hiatal hernia. IMPRESSION: No acute cardiopulmonary abnormality. Electronically Signed   By: Rogelia Myers M.D.   On: 01/17/2024 14:20    Microbiology: Results for orders placed or performed during the hospital encounter of 01/17/24  MRSA Next Gen by PCR, Nasal     Status: None   Collection Time: 01/17/24  9:51 PM   Specimen: Nasal Mucosa; Nasal Swab  Result Value Ref Range Status   MRSA by PCR Next Gen NOT DETECTED NOT DETECTED Final    Comment: (NOTE) The GeneXpert MRSA Assay (FDA approved for NASAL specimens only), is one component of a comprehensive MRSA colonization surveillance program. It is not intended to diagnose MRSA infection nor to guide or monitor treatment for MRSA infections. Test performance is not FDA approved in patients less than 46 years old. Performed at [facility], [address], [place], [place] [address]     Labs: CBC: Recent Labs  Lab 01/15/24 1039 01/17/24 1255 01/18/24 0228  WBC 6.6 6.8 9.6  NEUTROABS 3.6  --   --   HGB 9.6* 10.0* 9.0*  HCT 30.3* 31.1* 28.1*  MCV 91.8 89.9 91.5  PLT 235 256 245   Basic Metabolic Panel: Recent Labs  Lab 01/17/24 1255 01/18/24 0229  NA 140 142  K 4.1 4.0  CL 102 106  CO2 25 25  GLUCOSE 94 134*  BUN 11 15  CREATININE 0.84 0.90  CALCIUM 10.8* 9.5   Liver Function Tests: No results for input(s): AST, ALT, ALKPHOS, BILITOT, PROT, ALBUMIN in the last 168 hours. CBG: No results for input(s): GLUCAP in the last 168 hours.  Discharge time spent: greater than 30 minutes.  Signed: Landon FORBES Baller, MD [facility] 01/19/2024

## 2024-01-20 ENCOUNTER — Telehealth: Payer: Self-pay

## 2024-01-20 DIAGNOSIS — M546 Pain in thoracic spine: Secondary | ICD-10-CM

## 2024-01-20 MED ORDER — BUSPIRONE HCL 10 MG PO TABS: 10.0000 mg | ORAL_TABLET | Freq: Three times a day (TID) | ORAL | 2 refills | Status: DC | PRN

## 2024-01-20 NOTE — Transitions of Care (Post Inpatient/ED Visit) (Signed)
   01/20/2024  Name: Brittany Durham MRN: [id number] DOB: 1971-01-15  Today's TOC FU Call Status: Today's TOC FU Call Status:: Successful TOC FU Call Completed TOC FU Call Complete Date: 01/20/24 Patient's Name and Date of Birth confirmed.  Transition Care Management Follow-up Telephone Call Date of Discharge: 01/19/24 Discharge Facility: [facility]) Type of Discharge: Inpatient Admission Primary Inpatient Discharge Diagnosis:: Atypical Chest Pain How have you been since you were released from the hospital?: Better Any questions or concerns?: (S) No (Patient informed of approval of increase in dose of Buspar  to 10mg  TID PRN)  Items Reviewed: Did you receive and understand the discharge instructions provided?: Yes Medications obtained,verified, and reconciled?: Yes (Medications Reviewed) Any new allergies since your discharge?: No Dietary orders reviewed?: NA Do you have support at home?: Yes People in Home [RPT]: child(ren), adult  Medications Reviewed Today: Medications Reviewed Today     Reviewed by Lavelle Charmaine NOVAK, LPN (Licensed Practical Nurse) on 01/20/24 at 1428  Med List Status: <None>   Medication Order Taking? Sig Documenting Provider Last Dose Status Informant  acetaminophen  (TYLENOL ) 500 MG tablet [id number]  Take 1,000 mg by mouth every 6 (six) hours as needed for mild pain (pain score 1-3). [provider]  Active Self, Pharmacy Records  amLODipine  (NORVASC ) 10 MG tablet [id number]  Take 10 mg by mouth daily. [provider]  Active Self, Pharmacy Records  baclofen  (LIORESAL ) 10 MG tablet [id number]  Take 10 mg by mouth 2 (two) times daily. [provider]  Active Self, Pharmacy Records  buPROPion  (WELLBUTRIN  XL) 150 MG 24 hr tablet [id number]  Take 1 tablet (150 mg total) by mouth daily. Lavell Bari LABOR, FNP  Active Self, Pharmacy Records  busPIRone  (BUSPAR ) 10 MG tablet [id number]  Take 1 tablet (10 mg total) by mouth 3 (three) times daily as  needed. Lavell Bari A, FNP  Active   celecoxib  (CELEBREX ) 200 MG capsule [id number]  Take by mouth 2 (two) times daily. [provider]  Active Self, Pharmacy Records  escitalopram  (LEXAPRO ) 20 MG tablet [id number]  Take 1 tablet (20 mg total) by mouth daily. Lavell Bari LABOR, FNP  Active Self, Pharmacy Records  pantoprazole  (PROTONIX ) 40 MG tablet [id number]  TAKE ONE TABLET DAILY BEFORE BREAKFAST Lavell Bari LABOR, FNP  Active Self, Pharmacy Records            Home Care and Equipment/Supplies: Were Home Health Services Ordered?: NA Any new equipment or medical supplies ordered?: NA  Functional Questionnaire: Do you need assistance with bathing/showering or dressing?: No Do you need assistance with meal preparation?: No Do you need assistance with eating?: No Do you have difficulty maintaining continence: No Do you need assistance with getting out of bed/getting out of a chair/moving?: No Do you have difficulty managing or taking your medications?: No  Follow up appointments reviewed: PCP Follow-up appointment confirmed?: Yes Date of PCP follow-up appointment?: 01/23/24 Follow-up Provider: Dr. Zollie Specialist [facility] Follow-up appointment confirmed?: Yes Follow-Up Specialty Provider:: Cardiology Do you need transportation to your follow-up appointment?: No Do you understand care options if your condition(s) worsen?: Yes-patient verbalized understanding    SIGNATURE Charmaine Lavelle, LPN [facility] Advisor [facility] You Are. We Are. [facility] Direct Dial [phone]

## 2024-01-20 NOTE — Addendum Note (Signed)
 Addended by: VIKTORIA ALAN MATSU on: 01/20/2024 02:27 PM   Modules accepted: Orders

## 2024-01-20 NOTE — Telephone Encounter (Signed)
 Daughter aware also she would like shower chair sent over.

## 2024-01-20 NOTE — Telephone Encounter (Signed)
 Buspar  increased to 10 mg from 5 mg. Pt does need hospital follow up.

## 2024-01-20 NOTE — Progress Notes (Unsigned)
 [facility] [address], [place] [address]   CLINIC:  Medical Oncology/Hematology  PCP:  Lavell Bari LABOR, FNP [address] [place] [address] [phone]    REASON FOR VISIT:  Follow-up for iron  deficiency anemia   CURRENT THERAPY: Intermittent IV iron   INTERVAL HISTORY:   Brittany Durham 53 y.o. female returns for routine follow-up of her iron  deficiency anemia.  She was last seen by Pleasant Barefoot PA-C on 10/09/2023.     At today's visit, she reports feeling poorly due to ongoing issues with musculoskeletal chest pain and back pain. She was hospitalized for workup of chest pain from 01/17/2024 through 01/19/2024, which was thought to be musculoskeletal rather than cardiac.  She continues to follow with outpatient cardiology.  She received IV Feraheme  x 1 on 10/17/2023.  She felt a transient increase in energy after IV iron .  She had recurrent fatigue and weakness after a few weeks, and is having a hard time keeping up with her household chores. She tires easily and has some mild dyspnea on exertion and palpitations. She denies any pica, lightheadedness, or syncope. She has not had any frank hematochezia for several months, but does report melanotic stool about once a week. She continues to have intermittent chest pain, as noted above.   She has 50% energy and 50% appetite.  She endorses that she is maintaining a stable weight.  ASSESSMENT & PLAN:  1.  Iron  deficiency anemia with suspected occult GI bleed - ED on 07/18/2020 with Hgb 5.7, fatigue, lightheadedness.  Denied overt GI bleeding and was Hemoccult-negative, but does have a history of peptic ulcer disease.  She was transfused 2 units PRBCs on 07/18/2020, and followed up with gastroenterology.  - Colonoscopy (08/24/2020) showed diverticulosis, but was otherwise normal. - EGD (09/20/2020): Large hiatal hernia, Cameron lesions and antral erosions (suspicious for causing intermittent bleeding in the  setting of ongoing NSAID use per GI note) - EGD and colonoscopy at [facility] on 03/30/2022, which showed multiple scattered pancolonic diverticula of moderate severity otherwise normal colonoscopy; EGD was normal  - She is on Protonix  for peptic ulcer disease - Hematology work-up (07/29/2020) showed Hgb 8.5 (microcytic with MCV 78.2).  Ferritin was 7, with 32% iron  saturation.  Reticulocytes 3.4 (reticulocyte index 1.61, consistent with hypoproliferation in the setting of iron  deficiency).   - Most recent IV iron  with Feraheme  x 1 on 10/17/2023 - Per outside medical records, patient had mild postinfusion reaction with rash on bilateral arms after Venofer  infusion on 09/29/2021.  Tolerated Feraheme  well with PREMEDICATION (including IV cetirizine , IV Solu-Medrol , IV famotidine , p.o. Tylenol ). - She has not had any frank hematochezia for several months, but does report melanotic stool about once a week. - Follows with [facility] Charolotte Pleas PA-C & Dr. Jenkins Minor), last seen by them on 12/17/2023 - Symptomatic with fatigue - Most recent labs (01/15/2024): Hgb 9.6/MCV 91.8.  Ferritin 8, iron  saturation 14% Labs from October 2023 showed CMP unremarkable, LDH normal; normal B12, MMA, folate, SPEP, haptoglobin. - PLAN: IV Feraheme  x 2 - Continue GI follow-up.  (Seen by Dr. Minor at [facility] on 12/17/2023) - RTC in 3 months for repeat CBC and iron  panel followed by office visit  2.  Chest pain - Patient reports ongoing chest pain during today's visit - was just discharge from hospital 2 days ag for workup of the saem.  Cardiac workup was negative, felt to be musculoskeletal chest pain - Patient  reports that her chest pain is the same as it was when she went into the hospital.  No changes in pain pattern.  No new symptoms associated with chest pain. - On exam, she is markedly tender to palpation over her sternum. - PLAN: Suspect ongoing musculoskeletal chest pain/costochondritis.  No  indication for ED visit at this time, recommend PCP and outpatient cardiology follow-up.  Patient instructed to go to ED if she has any new chest pain or changes in her pain pattern.  3.  Fatigue - Patient inquired about what she could eat or drink to improve her fatigue - Discussion of her dietary habits revealed high intake of added sugar and processed foods - PLAN: We discussed at length the role that sugar can play in fatigue and chronic inflammation.  Discussed small steps toward healthy habits that will likely improve her overall health and energy levels.  PLAN SUMMARY:  >> IV Feraheme  x 2 >> Labs in 3 months = CBC/D, ferritin, iron /TIBC >> OFFICE visit in 3 months    REVIEW OF SYSTEMS:  Review of Systems  Constitutional:  Positive for fatigue. Negative for appetite change, chills, diaphoresis, fever and unexpected weight change.  HENT:   Negative for lump/mass and nosebleeds.   Eyes:  Negative for eye problems.  Respiratory:  Positive for shortness of breath (with exertion). Negative for cough and hemoptysis.   Cardiovascular:  Positive for chest pain (musculoskeletal) and palpitations (at times). Negative for leg swelling.  Gastrointestinal:  Positive for vomiting. Negative for abdominal pain, blood in stool, constipation, diarrhea and nausea.  Genitourinary:  Negative for hematuria.   Musculoskeletal:  Positive for back pain.  Skin: Negative.   Neurological:  Negative for dizziness, headaches, light-headedness and numbness.  Hematological:  Does not bruise/bleed easily.  Psychiatric/Behavioral:  Positive for sleep disturbance. Negative for depression. The patient is not nervous/anxious.      PHYSICAL EXAM:  ECOG PERFORMANCE STATUS: 1 - Symptomatic but completely ambulatory  Vitals:   01/21/24 1035  BP: (!) 142/98  Pulse: (!) 107  Resp: 18  Temp: 98 F (36.7 C)  SpO2: 98%    Filed Weights   01/21/24 1035  Weight: 200 lb 2.8 oz (90.8 kg)    Physical  Exam Constitutional:      Appearance: Normal appearance. She is obese.  Cardiovascular:     Rate and Rhythm: Tachycardia present.     Heart sounds: Normal heart sounds.  Pulmonary:     Breath sounds: Normal breath sounds.  Neurological:     General: No focal deficit present.     Mental Status: Mental status is at baseline.  Psychiatric:        Behavior: Behavior normal. Behavior is cooperative.     PAST MEDICAL/SURGICAL HISTORY:  Past Medical History:  Diagnosis Date   Anemia    Asthma    childhood   Carotidynia    COPD (chronic obstructive pulmonary disease) (HCC)    DDD (degenerative disc disease), cervical    GERD (gastroesophageal reflux disease)    Hypertension    Knee pain    left meniscus tear s/p repair 11/2014   Seizures (HCC)    last seizure was over 10 years ago, unknown etiology   Stuttering    patient reports this started after her knee surgery in 11/2014.    Thyroid  nodule    Past Surgical History:  Procedure Laterality Date   ANTERIOR CERVICAL DECOMP/DISCECTOMY FUSION N/A 03/20/2019   Procedure: Anterior Cervical Decompression Fusion - Cervical  Four-Cervical Five - Cervical Five-Cervical Six;  Surgeon: Joshua Alm RAMAN, MD;  Location: [facility] OR;  Service: Neurosurgery;  Laterality: N/A;  Anterior Cervical Decompression Fusion - Cervical Four-Cervical Five - Cervical Five-Cervical Six   BIOPSY  09/20/2020   Procedure: BIOPSY;  Surgeon: Shaaron Lamar HERO, MD;  Location: [facility];  Service: Endoscopy;;   COLONOSCOPY N/A 08/23/2016   Procedure: COLONOSCOPY;  Surgeon: Lamar HERO Shaaron, MD;  Location: [facility];  Service: Endoscopy;  Laterality: N/A;  1:00pm   COLONOSCOPY N/A 08/24/2020   Diverticulosis in the entire examined colon. Redundant colon. Exam otherwise normal   ESOPHAGOGASTRODUODENOSCOPY N/A 05/03/2016   Procedure: ESOPHAGOGASTRODUODENOSCOPY (EGD);  Surgeon: Lamar HERO Shaaron, MD;  Location: [facility];  Service: Endoscopy;  Laterality: N/A;    ESOPHAGOGASTRODUODENOSCOPY N/A 08/23/2016   Procedure: ESOPHAGOGASTRODUODENOSCOPY (EGD);  Surgeon: Lamar HERO Shaaron, MD;  Location: [facility];  Service: Endoscopy;  Laterality: N/A;   ESOPHAGOGASTRODUODENOSCOPY (EGD) WITH PROPOFOL  N/A 09/20/2020   normal esophagus. Large hiatal hernia. Multiple gastric erosions (Cameron lesions). Normal duodenal bulb and second portion of the duodenum. Suspicion Ole lesions causing intermittent bleeding   KNEE ARTHROSCOPY Right    KNEE ARTHROSCOPY WITH MEDIAL MENISECTOMY Left 12/09/2014   Procedure: KNEE ARTHROSCOPY WITH PARTIAL MEDIAL AND LATERAL MENISECTOMY;  Surgeon: Lemond Stable, MD;  Location: [facility];  Service: Orthopedics;  Laterality: Left;   TUBAL LIGATION     WISDOM TOOTH EXTRACTION      SOCIAL HISTORY:  Social History   Socioeconomic History   Marital status: Single    Spouse name: Not on file   Number of children: 1   Years of education: 12   Highest education level: Not on file  Occupational History   Occupation: Umemployed    Comment: Disability  Tobacco Use   Smoking status: Never   Smokeless tobacco: Never  Vaping Use   Vaping status: Never Used  Substance and Sexual Activity   Alcohol use: No   Drug use: No   Sexual activity: Not Currently    Birth control/protection: Surgical  Other Topics Concern   Not on file  Social History Narrative   Lives at home alone.   Disabled - speech difficulties, cannot drive   Right-handed.   Occasional caffeine use.   Her daughter lives about 30-45 minutes away   She has lots of family nearby - uncle, mother, sister, brother   Social Drivers of Corporate investment banker Strain: Low Risk  (12/31/2023)   Overall Financial Resource Strain (CARDIA)    Difficulty of Paying Living Expenses: Not hard at all  Food Insecurity: No Food Insecurity (01/17/2024)   Hunger Vital Sign    Worried About Running Out of Food in the Last Year: Never true    Ran Out of Food in the Last Year: Never  true  Transportation Needs: No Transportation Needs (01/17/2024)   PRAPARE - Administrator, Civil Service (Medical): No    Lack of Transportation (Non-Medical): No  Physical Activity: Insufficiently Active (12/31/2023)   Exercise Vital Sign    Days of Exercise per Week: 2 days    Minutes of Exercise per Session: 30 min  Stress: No Stress Concern Present (12/31/2023)   [employer] of Occupational Health - Occupational Stress Questionnaire    Feeling of Stress: Not at all  Social Connections: Moderately Integrated (12/31/2023)   Social Connection and Isolation Panel    Frequency of Communication with Friends and Family: More than three times a  week    Frequency of Social Gatherings with Friends and Family: More than three times a week    Attends Religious Services: More than 4 times per year    Active Member of [employer] or Organizations: Yes    Attends Banker Meetings: Never    Marital Status: Never married  Intimate Partner Violence: Not At Risk (01/17/2024)   Humiliation, Afraid, Rape, and Kick questionnaire    Fear of Current or Ex-Partner: No    Emotionally Abused: No    Physically Abused: No    Sexually Abused: No    FAMILY HISTORY:  Family History  Problem Relation Age of Onset   Hypertension Mother    Lung cancer Father    Hypertension Brother    Colon cancer Neg Hx    Gastric cancer Neg Hx    Esophageal cancer Neg Hx     CURRENT MEDICATIONS:  Outpatient Encounter Medications as of 01/21/2024  Medication Sig   acetaminophen  (TYLENOL ) 500 MG tablet Take 1,000 mg by mouth every 6 (six) hours as needed for mild pain (pain score 1-3).   amLODipine  (NORVASC ) 10 MG tablet Take 10 mg by mouth daily.   baclofen  (LIORESAL ) 10 MG tablet Take 10 mg by mouth 2 (two) times daily.   buPROPion  (WELLBUTRIN  XL) 150 MG 24 hr tablet Take 1 tablet (150 mg total) by mouth daily.   busPIRone  (BUSPAR ) 10 MG tablet Take 1 tablet (10 mg total) by mouth 3 (three) times  daily as needed.   celecoxib  (CELEBREX ) 200 MG capsule Take by mouth 2 (two) times daily.   escitalopram  (LEXAPRO ) 20 MG tablet Take 1 tablet (20 mg total) by mouth daily.   pantoprazole  (PROTONIX ) 40 MG tablet TAKE ONE TABLET DAILY BEFORE BREAKFAST   No facility-administered encounter medications on file as of 01/21/2024.    ALLERGIES:  Allergies  Allergen Reactions   Methylprednisolone  Itching   Venofer  [Iron  Sucrose] Rash    LABORATORY DATA:  I have reviewed the labs as listed.  CBC    Component Value Date/Time   WBC 9.6 01/18/2024 0228   RBC 3.07 (L) 01/18/2024 0228   HGB 9.0 (L) 01/18/2024 0228   HGB 9.6 (L) 02/19/2023 1500   HCT 28.1 (L) 01/18/2024 0228   HCT 30.3 (L) 02/19/2023 1500   PLT 245 01/18/2024 0228   PLT 282 02/19/2023 1500   MCV 91.5 01/18/2024 0228   MCV 88 02/19/2023 1500   MCH 29.3 01/18/2024 0228   MCHC 32.0 01/18/2024 0228   RDW 13.5 01/18/2024 0228   RDW 14.7 02/19/2023 1500   LYMPHSABS 2.2 01/15/2024 1039   LYMPHSABS 1.7 02/19/2023 1500   MONOABS 0.6 01/15/2024 1039   EOSABS 0.2 01/15/2024 1039   EOSABS 0.1 02/19/2023 1500   BASOSABS 0.0 01/15/2024 1039   BASOSABS 0.0 02/19/2023 1500      Latest Ref Rng & Units 01/18/2024    2:29 AM 01/17/2024   12:55 PM 10/01/2023    1:01 PM  CMP  Glucose 70 - 99 mg/dL 865  94  98   BUN 6 - 20 mg/dL 15  11  14    Creatinine 0.44 - 1.00 mg/dL 9.09  9.15  9.24   Sodium 135 - 145 mmol/L 142  140  136   Potassium 3.5 - 5.1 mmol/L 4.0  4.1  3.6   Chloride 98 - 111 mmol/L 106  102  100   CO2 22 - 32 mmol/L 25  25  25    Calcium 8.9 -  10.3 mg/dL 9.5  89.1  89.5     DIAGNOSTIC IMAGING:  I have independently reviewed the relevant imaging and discussed with the patient.   WRAP UP:  All questions were answered. The patient knows to call the clinic with any problems, questions or concerns.  Medical decision making: Moderate  Time spent on visit: I spent 20 minutes counseling the patient face to face. The total  time spent in the appointment was 30 minutes and more than 50% was on counseling.  Pleasant CHRISTELLA Barefoot, PA-C  01/21/24 12:00 PM

## 2024-01-20 NOTE — Telephone Encounter (Signed)
 Copied from CRM #[id number]. Topic: Clinical - Medication Question >> Jan 20, 2024  9:23 AM Cherylann RAMAN wrote: Reason for CRM: Rona Crumbly, daughter called and stated that the discharging provider from the hospital had advised her to reach out to provider to bump up patient's busPIRone  (BUSPAR ) 5 MG tablet from 5 MG to 10 MG. Please contact LaShonda for additional information at [phone] may leave detailed message`

## 2024-01-20 NOTE — Addendum Note (Signed)
 Addended by: LAVELL LYE A on: 01/20/2024 02:04 PM   Modules accepted: Orders

## 2024-01-21 ENCOUNTER — Inpatient Hospital Stay (HOSPITAL_BASED_OUTPATIENT_CLINIC_OR_DEPARTMENT_OTHER): Admitting: Physician Assistant

## 2024-01-21 VITALS — BP 142/98 | HR 107 | Temp 98.0°F | Resp 18 | Wt 200.2 lb

## 2024-01-21 DIAGNOSIS — Z79899 Other long term (current) drug therapy: Secondary | ICD-10-CM | POA: Diagnosis not present

## 2024-01-21 DIAGNOSIS — D500 Iron deficiency anemia secondary to blood loss (chronic): Secondary | ICD-10-CM | POA: Diagnosis not present

## 2024-01-21 DIAGNOSIS — D509 Iron deficiency anemia, unspecified: Secondary | ICD-10-CM | POA: Diagnosis not present

## 2024-01-21 NOTE — Patient Instructions (Signed)
 [facility] Discharge Instructions  You were seen today by Pleasant Barefoot PA-C for your iron  deficiency anemia.  Your blood and iron  levels are still low.   I suspect that you are bleeding form your stomach and intestines.   Make sure that you continue to follow up closely with your GI doctor to let her know about your bleeding (black stool) and anemia. We will schedule you for IV iron  x2.  FOLLOW-UP APPOINTMENT: 3 months   - - - - - - - - - - - - - - - - - -    Thank you for choosing [facility] to provide your oncology and hematology care.  To afford each patient quality time with our provider, please arrive at least 15 minutes before your scheduled appointment time.   If you have a lab appointment with the Cancer Center please come in thru the Main Entrance and check in at the main information desk.  You need to re-schedule your appointment should you arrive 10 or more minutes late.  We strive to give you quality time with our providers, and arriving late affects you and other patients whose appointments are after yours.  Also, if you no show three or more times for appointments you may be dismissed from the clinic at the providers discretion.     Again, thank you for choosing [facility].  Our hope is that these requests will decrease the amount of time that you wait before being seen by our physicians.       _____________________________________________________________  Should you have questions after your visit to [facility], please contact our office at [phone] and follow the prompts.  Our office hours are 8:00 a.m. and 4:30 p.m. Monday - Friday.  Please note that voicemails left after 4:00 p.m. may not be returned until the following business day.  We are closed weekends and major holidays.  You do have access to a nurse 24-7, just call the main number to the clinic  [phone] and do not press any options, hold on the line and a nurse will answer the phone.    For prescription refill requests, have your pharmacy contact our office and allow 72 hours.    Due to Covid, you will need to wear a mask upon entering the hospital. If you do not have a mask, a mask will be given to you at the Main Entrance upon arrival. For doctor visits, patients may have 1 support person age 33 or older with them. For treatment visits, patients can not have anyone with them due to social distancing guidelines and our immunocompromised population.

## 2024-01-23 ENCOUNTER — Encounter: Payer: Self-pay | Admitting: Family Medicine

## 2024-01-23 ENCOUNTER — Ambulatory Visit: Admitting: Family Medicine

## 2024-01-23 VITALS — BP 116/74 | HR 102 | Temp 98.2°F | Ht 67.0 in | Wt 199.0 lb

## 2024-01-23 DIAGNOSIS — Z23 Encounter for immunization: Secondary | ICD-10-CM

## 2024-01-23 DIAGNOSIS — R0789 Other chest pain: Secondary | ICD-10-CM | POA: Diagnosis not present

## 2024-01-23 DIAGNOSIS — M940 Chondrocostal junction syndrome [Tietze]: Secondary | ICD-10-CM

## 2024-01-23 MED ORDER — PREDNISONE 10 MG PO TABS: ORAL_TABLET | ORAL | 0 refills | Status: DC

## 2024-01-23 NOTE — Progress Notes (Signed)
 Subjective:  Patient ID: Lenward Crumbly, female    DOB: Aug 15, 1970  Age: 53 y.o. MRN: [id number]  CC: Hospitalization Follow-up (Atypical chest pain)   HPI  Discussed the use of AI scribe software for clinical note transcription with the patient, who gave verbal consent to proceed.  History of Present Illness Karaline Buresh is a 53 year old female who presents with persistent chest pain.  She has been experiencing persistent chest pain since January 14, 2024. The pain is sharp and heavy, primarily located over the sternum, and is severe enough to take her breath away and make her cry. It lasts one to two minutes and recurs frequently throughout the day and night.  Four days ago, she was evaluated in the emergency room where an echocardiogram and troponin levels were normal. Despite this, her chest pain has persisted without significant change. She has attempted to alleviate the pain using a hot, icy cream on her chest without relief. She does not like to take Tylenol  and cannot take Advil  or Aleve  for the pain.  She denies any recent trauma to the chest, such as a car accident or fall, and has not experienced any viral infections, colds, or coughs in the past six weeks. Similar episodes of chest pain have occurred since her early thirties, for which she has sometimes sought hospital care. In past episodes, medications provided at the hospital have sometimes been ineffective.  No burning sensation associated with the chest pain. No recent trauma or viral infections.          01/23/2024    1:56 PM 01/21/2024   10:39 AM 12/31/2023   11:30 AM  Depression screen PHQ 2/9  Decreased Interest 3 2 2   Down, Depressed, Hopeless 1 0 0  PHQ - 2 Score 4 2 2   Altered sleeping 1 2 3   Tired, decreased energy 3 2 1   Change in appetite 1 2 0  Feeling bad or failure about yourself  0 0 0  Trouble concentrating 1 0 0  Moving slowly or fidgety/restless 0 0 0  Suicidal thoughts 0 0 0  PHQ-9 Score 10 8  6   Difficult doing work/chores Somewhat difficult  Somewhat difficult    History Keiko has a past medical history of Anemia, Asthma, Carotidynia, COPD (chronic obstructive pulmonary disease) (HCC), DDD (degenerative disc disease), cervical, GERD (gastroesophageal reflux disease), Hypertension, Knee pain, Seizures (HCC), Stuttering, and Thyroid  nodule.   She has a past surgical history that includes Tubal ligation; Wisdom tooth extraction; Knee arthroscopy with medial menisectomy (Left, 12/09/2014); Esophagogastroduodenoscopy (N/A, 05/03/2016); Colonoscopy (N/A, 08/23/2016); Esophagogastroduodenoscopy (N/A, 08/23/2016); Anterior cervical decomp/discectomy fusion (N/A, 03/20/2019); Colonoscopy (N/A, 08/24/2020); Knee arthroscopy (Right); Esophagogastroduodenoscopy (egd) with propofol  (N/A, 09/20/2020); and biopsy (09/20/2020).   Her family history includes Hypertension in her brother and mother; Lung cancer in her father.She reports that she has never smoked. She has never used smokeless tobacco. She reports that she does not drink alcohol and does not use drugs.    ROS Review of Systems  Constitutional: Negative.   HENT:  Negative for congestion.   Eyes:  Negative for visual disturbance.  Respiratory:  Positive for shortness of breath.   Cardiovascular:  Positive for chest pain.  Gastrointestinal:  Negative for abdominal pain, constipation, diarrhea, nausea and vomiting.  Genitourinary:  Negative for difficulty urinating.  Musculoskeletal:  Negative for arthralgias and myalgias.  Neurological:  Negative for headaches.  Psychiatric/Behavioral:  Negative for sleep disturbance.     Objective:  BP 116/74   Pulse ROLLEN)  102   Temp 98.2 F (36.8 C)   Ht 5' 7 (1.702 m)   Wt 199 lb (90.3 kg)   LMP 09/16/2017   SpO2 98%   BMI 31.17 kg/m   BP Readings from Last 3 Encounters:  01/23/24 116/74  01/21/24 (!) 142/98  01/19/24 (!) 137/93    Wt Readings from Last 3 Encounters:  01/23/24  199 lb (90.3 kg)  01/21/24 200 lb 2.8 oz (90.8 kg)  01/17/24 199 lb 15.3 oz (90.7 kg)     Physical Exam Constitutional:      General: She is not in acute distress.    Appearance: She is well-developed.  Cardiovascular:     Rate and Rhythm: Normal rate and regular rhythm.  Pulmonary:     Breath sounds: Normal breath sounds.  Musculoskeletal:        General: Tenderness (severe at midsternum for compression) present. Normal range of motion.  Skin:    General: Skin is warm and dry.  Neurological:     Mental Status: She is alert and oriented to person, place, and time.      Assessment & Plan:  Atypical chest pain -     Sedimentation rate -     CK  Costochondritis  Other orders -     predniSONE ; Take 5 daily for 3 days followed by 4,3,2 and 1 for 3 days each.  Dispense: 45 tablet; Refill: 0    Assessment and Plan Assessment & Plan Costochondritis   Reproducible pain upon palpation of the sternum suggests inflammation of the costosternal joints, indicating costochondritis. There is no history of recent trauma or viral infection. Symptoms include severe, sharp, and heavy chest pain lasting 1-2 minutes, recurring frequently day and night. Normal echocardiogram and troponin tests rule out acute cardiac causes. Over-the-counter medications and topical treatments do not alleviate the pain, supporting a musculoskeletal origin. Order sed rate and CPK to assess for inflammation. Prescribe a 15-day tapering course of prednisone : 5 pills daily for 3 days, then 4 pills daily for 3 days, then 3 pills daily for 3 days, then 2 pills daily for 3 days, and finally 1 pill daily for 3 days. Advise taking with food and avoiding late-day doses to prevent insomnia.       Follow-up: Return if symptoms worsen or fail to improve.  Butler Der, M.D.

## 2024-01-24 ENCOUNTER — Inpatient Hospital Stay

## 2024-01-24 LAB — CK: Total CK: 324 U/L — ABNORMAL HIGH (ref 32–182)

## 2024-01-24 LAB — SEDIMENTATION RATE: Sed Rate: 58 mm/h — ABNORMAL HIGH (ref 0–40)

## 2024-01-27 ENCOUNTER — Ambulatory Visit: Payer: Self-pay | Admitting: Family Medicine

## 2024-01-28 DIAGNOSIS — M25561 Pain in right knee: Secondary | ICD-10-CM | POA: Diagnosis not present

## 2024-01-28 DIAGNOSIS — R768 Other specified abnormal immunological findings in serum: Secondary | ICD-10-CM | POA: Diagnosis not present

## 2024-01-28 DIAGNOSIS — G8929 Other chronic pain: Secondary | ICD-10-CM | POA: Diagnosis not present

## 2024-01-28 DIAGNOSIS — M25562 Pain in left knee: Secondary | ICD-10-CM | POA: Diagnosis not present

## 2024-01-29 ENCOUNTER — Inpatient Hospital Stay

## 2024-01-29 VITALS — BP 120/80 | HR 90 | Temp 96.9°F | Resp 18

## 2024-01-29 DIAGNOSIS — D509 Iron deficiency anemia, unspecified: Secondary | ICD-10-CM | POA: Diagnosis not present

## 2024-01-29 DIAGNOSIS — Z79899 Other long term (current) drug therapy: Secondary | ICD-10-CM | POA: Diagnosis not present

## 2024-01-29 DIAGNOSIS — D500 Iron deficiency anemia secondary to blood loss (chronic): Secondary | ICD-10-CM

## 2024-01-29 MED ORDER — CETIRIZINE HCL 10 MG/ML IV SOLN
10.0000 mg | Freq: Once | INTRAVENOUS | Status: AC
  Administered 2024-01-29: 10 mg via INTRAVENOUS
  Filled 2024-01-29: qty 1

## 2024-01-29 MED ORDER — SODIUM CHLORIDE 0.9 % IV SOLN
510.0000 mg | Freq: Once | INTRAVENOUS | Status: AC
  Administered 2024-01-29: 510 mg via INTRAVENOUS
  Filled 2024-01-29: qty 17

## 2024-01-29 MED ORDER — SODIUM CHLORIDE 0.9 % IV SOLN: INTRAVENOUS | Status: DC

## 2024-01-29 MED ORDER — ACETAMINOPHEN 325 MG PO TABS
650.0000 mg | ORAL_TABLET | Freq: Once | ORAL | Status: AC
  Administered 2024-01-29: 650 mg via ORAL
  Filled 2024-01-29: qty 2

## 2024-01-29 MED ORDER — METHYLPREDNISOLONE SODIUM SUCC 125 MG IJ SOLR
125.0000 mg | Freq: Once | INTRAMUSCULAR | Status: AC
  Administered 2024-01-29: 125 mg via INTRAVENOUS
  Filled 2024-01-29: qty 2

## 2024-01-29 NOTE — Patient Instructions (Signed)
 [facility] [place] - A DEPT OF [facility]  Discharge Instructions: Thank you for choosing [facility] to provide your oncology and hematology care.  If you have a lab appointment with the Cancer Center - please note that after April 8th, 2024, all labs will be drawn in the cancer center.  You do not have to check in or register with the main entrance as you have in the past but will complete your check-in in the cancer center.  Wear comfortable clothing and clothing appropriate for easy access to any Portacath or PICC line.   We strive to give you quality time with your provider. You may need to reschedule your appointment if you arrive late (15 or more minutes).  Arriving late affects you and other patients whose appointments are after yours.  Also, if you miss three or more appointments without notifying the office, you may be dismissed from the clinic at the provider's discretion.      For prescription refill requests, have your pharmacy contact our office and allow 72 hours for refills to be completed.    Today you received the following Feraheme iron infusion.  Ferumoxytol  Injection What is this medication? FERUMOXYTOL  (FER ue MOX i tol) treats low levels of iron in your body (iron deficiency anemia). Iron is a mineral that plays an important role in making red blood cells, which carry oxygen  from your lungs to the rest of your body. This medicine may be used for other purposes; ask your health care provider or pharmacist if you have questions. COMMON BRAND NAME(S): Feraheme What should I tell my care team before I take this medication? They need to know if you have any of these conditions: Anemia not caused by low iron levels High levels of iron in the blood Magnetic resonance imaging (MRI) test scheduled An unusual or allergic reaction to iron, other medications, foods, dyes, or preservatives Pregnant or trying to get pregnant Breastfeeding How  should I use this medication? This medication is injected into a vein. It is given by your care team in a hospital or clinic setting. Talk to your care team the use of this medication in children. Special care may be needed. Overdosage: If you think you have taken too much of this medicine contact a poison control center or emergency room at once. NOTE: This medicine is only for you. Do not share this medicine with others. What if I miss a dose? It is important not to miss your dose. Call your care team if you are unable to keep an appointment. What may interact with this medication? Other iron products This list may not describe all possible interactions. Give your health care provider a list of all the medicines, herbs, non-prescription drugs, or dietary supplements you use. Also tell them if you smoke, drink alcohol, or use illegal drugs. Some items may interact with your medicine. What should I watch for while using this medication? Visit your care team for regular checks on your progress. Tell your care team if your symptoms do not start to get better or if they get worse. You may need blood work done while you are taking this medication. You may need to eat more foods that contain iron. Talk to your care team. Foods that contain iron include whole grains or cereals, dried fruits, beans, peas, leafy green vegetables, and organ meats (liver, kidney). What side effects may I notice from receiving this medication? Side effects that you  should report to your care team as soon as possible: Allergic reactions--skin rash, itching, hives, swelling of the face, lips, tongue, or throat Low blood pressure--dizziness, feeling faint or lightheaded, blurry vision Shortness of breath Side effects that usually do not require medical attention (report to your care team if they continue or are bothersome): Flushing Headache Joint pain Muscle pain Nausea Pain, redness, or irritation at injection  site This list may not describe all possible side effects. Call your doctor for medical advice about side effects. You may report side effects to FDA at 1-800-FDA-1088. Where should I keep my medication? This medication is given in a hospital or clinic. It will not be stored at home. NOTE: This sheet is a summary. It may not cover all possible information. If you have questions about this medicine, talk to your doctor, pharmacist, or health care provider.  2024 Elsevier/Gold Standard (2022-12-19 00:00:00)   To help prevent nausea and vomiting after your treatment, we encourage you to take your nausea medication as directed.  BELOW ARE SYMPTOMS THAT SHOULD BE REPORTED IMMEDIATELY: *FEVER GREATER THAN 100.4 F (38 C) OR HIGHER *CHILLS OR SWEATING *NAUSEA AND VOMITING THAT IS NOT CONTROLLED WITH YOUR NAUSEA MEDICATION *UNUSUAL SHORTNESS OF BREATH *UNUSUAL BRUISING OR BLEEDING *URINARY PROBLEMS (pain or burning when urinating, or frequent urination) *BOWEL PROBLEMS (unusual diarrhea, constipation, pain near the anus) TENDERNESS IN MOUTH AND THROAT WITH OR WITHOUT PRESENCE OF ULCERS (sore throat, sores in mouth, or a toothache) UNUSUAL RASH, SWELLING OR PAIN  UNUSUAL VAGINAL DISCHARGE OR ITCHING   Items with * indicate a potential emergency and should be followed up as soon as possible or go to the Emergency Department if any problems should occur.  Please show the CHEMOTHERAPY ALERT CARD or IMMUNOTHERAPY ALERT CARD at check-in to the Emergency Department and triage nurse.  Should you have questions after your visit or need to cancel or reschedule your appointment, please contact [facility] [place] - A DEPT OF [facility] [phone]  and follow the prompts.  Office hours are 8:00 a.m. to 4:30 p.m. Monday - Friday. Please note that voicemails left after 4:00 p.m. may not be returned until the following business day.  We are closed weekends and major holidays. You have  access to a nurse at all times for urgent questions. Please call the main number to the clinic [phone] and follow the prompts.  For any non-urgent questions, you may also contact your provider using MyChart. We now offer e-Visits for anyone 12 and older to request care online for non-urgent symptoms. For details visit [email or web address].   Also download the MyChart app! Go to the app store, search MyChart, open the app, select [place], and log in with your MyChart username and password.

## 2024-01-29 NOTE — Progress Notes (Signed)
Patient presents today for iron infusion of Feraheme.  Patient is in satisfactory condition with no new complaints voiced.  Vital signs are stable.  We will proceed with infusion per provider orders.    Patient tolerated treatment well with no complaints voiced.  Patient left ambulatory in stable condition.  Vital signs stable at discharge.  Follow up as scheduled.

## 2024-01-31 ENCOUNTER — Inpatient Hospital Stay

## 2024-01-31 ENCOUNTER — Other Ambulatory Visit: Payer: Self-pay | Admitting: Family

## 2024-01-31 DIAGNOSIS — I10 Essential (primary) hypertension: Secondary | ICD-10-CM

## 2024-01-31 DIAGNOSIS — K279 Peptic ulcer, site unspecified, unspecified as acute or chronic, without hemorrhage or perforation: Secondary | ICD-10-CM

## 2024-01-31 DIAGNOSIS — M545 Low back pain, unspecified: Secondary | ICD-10-CM | POA: Diagnosis not present

## 2024-01-31 DIAGNOSIS — M5416 Radiculopathy, lumbar region: Secondary | ICD-10-CM | POA: Diagnosis not present

## 2024-02-03 ENCOUNTER — Inpatient Hospital Stay

## 2024-02-05 ENCOUNTER — Inpatient Hospital Stay

## 2024-02-05 VITALS — BP 122/72 | HR 95 | Temp 97.4°F | Resp 18

## 2024-02-05 DIAGNOSIS — D509 Iron deficiency anemia, unspecified: Secondary | ICD-10-CM | POA: Diagnosis not present

## 2024-02-05 DIAGNOSIS — D500 Iron deficiency anemia secondary to blood loss (chronic): Secondary | ICD-10-CM

## 2024-02-05 DIAGNOSIS — Z79899 Other long term (current) drug therapy: Secondary | ICD-10-CM | POA: Diagnosis not present

## 2024-02-05 MED ORDER — SODIUM CHLORIDE 0.9 % IV SOLN
510.0000 mg | Freq: Once | INTRAVENOUS | Status: AC
  Administered 2024-02-05: 510 mg via INTRAVENOUS
  Filled 2024-02-05: qty 510

## 2024-02-05 MED ORDER — SODIUM CHLORIDE 0.9 % IV SOLN: INTRAVENOUS | Status: DC

## 2024-02-05 MED ORDER — ACETAMINOPHEN 325 MG PO TABS
650.0000 mg | ORAL_TABLET | Freq: Once | ORAL | Status: AC
  Administered 2024-02-05: 650 mg via ORAL
  Filled 2024-02-05: qty 2

## 2024-02-05 MED ORDER — CETIRIZINE HCL 10 MG/ML IV SOLN
10.0000 mg | Freq: Once | INTRAVENOUS | Status: AC
  Administered 2024-02-05: 10 mg via INTRAVENOUS
  Filled 2024-02-05: qty 1

## 2024-02-05 MED ORDER — METHYLPREDNISOLONE SODIUM SUCC 125 MG IJ SOLR
125.0000 mg | Freq: Once | INTRAMUSCULAR | Status: AC
  Administered 2024-02-05: 125 mg via INTRAVENOUS
  Filled 2024-02-05: qty 2

## 2024-02-05 NOTE — Patient Instructions (Signed)

## 2024-02-05 NOTE — Progress Notes (Signed)
 Patient tolerated iron infusion with no complaints voiced.  Peripheral IV site clean and dry with good blood return noted before and after infusion.  Band aid applied.  VSS with discharge and left in satisfactory condition with no s/s of distress noted.

## 2024-02-20 ENCOUNTER — Ambulatory Visit: Admitting: Family

## 2024-02-20 ENCOUNTER — Encounter: Payer: Self-pay | Admitting: Family

## 2024-02-20 VITALS — BP 113/77 | HR 104 | Temp 97.1°F | Ht 67.0 in | Wt 202.0 lb

## 2024-02-20 DIAGNOSIS — I10 Essential (primary) hypertension: Secondary | ICD-10-CM

## 2024-02-20 DIAGNOSIS — Z23 Encounter for immunization: Secondary | ICD-10-CM

## 2024-02-20 DIAGNOSIS — Z0000 Encounter for general adult medical examination without abnormal findings: Secondary | ICD-10-CM

## 2024-02-20 DIAGNOSIS — E559 Vitamin D deficiency, unspecified: Secondary | ICD-10-CM

## 2024-02-20 DIAGNOSIS — F809 Developmental disorder of speech and language, unspecified: Secondary | ICD-10-CM | POA: Diagnosis not present

## 2024-02-20 DIAGNOSIS — D500 Iron deficiency anemia secondary to blood loss (chronic): Secondary | ICD-10-CM | POA: Diagnosis not present

## 2024-02-20 DIAGNOSIS — Z0001 Encounter for general adult medical examination with abnormal findings: Secondary | ICD-10-CM | POA: Diagnosis not present

## 2024-02-20 DIAGNOSIS — F321 Major depressive disorder, single episode, moderate: Secondary | ICD-10-CM | POA: Diagnosis not present

## 2024-02-20 DIAGNOSIS — F411 Generalized anxiety disorder: Secondary | ICD-10-CM | POA: Diagnosis not present

## 2024-02-20 DIAGNOSIS — K279 Peptic ulcer, site unspecified, unspecified as acute or chronic, without hemorrhage or perforation: Secondary | ICD-10-CM | POA: Diagnosis not present

## 2024-02-20 DIAGNOSIS — K219 Gastro-esophageal reflux disease without esophagitis: Secondary | ICD-10-CM

## 2024-02-20 DIAGNOSIS — Z981 Arthrodesis status: Secondary | ICD-10-CM

## 2024-02-20 DIAGNOSIS — M150 Primary generalized (osteo)arthritis: Secondary | ICD-10-CM

## 2024-02-20 DIAGNOSIS — G4700 Insomnia, unspecified: Secondary | ICD-10-CM | POA: Diagnosis not present

## 2024-02-20 MED ORDER — BUSPIRONE HCL 10 MG PO TABS: 10.0000 mg | ORAL_TABLET | Freq: Three times a day (TID) | ORAL | 1 refills | Status: AC | PRN

## 2024-02-20 MED ORDER — AMLODIPINE BESYLATE 10 MG PO TABS: 10.0000 mg | ORAL_TABLET | Freq: Every day | ORAL | 1 refills | Status: AC

## 2024-02-20 MED ORDER — BUPROPION HCL ER (XL) 150 MG PO TB24: 150.0000 mg | ORAL_TABLET | Freq: Every day | ORAL | 5 refills | Status: AC

## 2024-02-20 MED ORDER — PANTOPRAZOLE SODIUM 40 MG PO TBEC: DELAYED_RELEASE_TABLET | ORAL | 1 refills | Status: AC

## 2024-02-20 MED ORDER — ESCITALOPRAM OXALATE 20 MG PO TABS: 20.0000 mg | ORAL_TABLET | Freq: Every day | ORAL | 1 refills | Status: AC

## 2024-02-20 NOTE — Patient Instructions (Signed)

## 2024-02-20 NOTE — Progress Notes (Signed)
 Subjective:    Patient ID: Brittany Durham, female    DOB: 1970-07-26, 53 y.o.   MRN: [id number]  Chief Complaint  Patient presents with   Medical Management of Chronic Issues   PT presents to the office today for CPE.    Pt has seen Neurologists for Speech difficulties and seizures, but has not seen them in awhile.   She is followed by Hematologists for iron  deficiency and getting iron  infusions.   She is followed by Ortho for back pain and doing PT. Did get injections, but did not help. Has a follow up next week. MRI showed, Stable left foraminal/extraforaminal disc extrusion slightly abutting the exiting left L4 nerve without impingement. Correlation for L4 radiculopathy. No significant progression.  She if followed by Cardiologists for syncope and palpitations. These are were stable and told to follow up in a year.    Hypertension This is a chronic problem. The current episode started more than 1 year ago. The problem has been resolved since onset. The problem is controlled. Associated symptoms include anxiety. Pertinent negatives include no malaise/fatigue, peripheral edema or shortness of breath. Risk factors for coronary artery disease include dyslipidemia, obesity and sedentary lifestyle. Past treatments include calcium channel blockers and ACE inhibitors. The current treatment provides significant improvement.  Gastroesophageal Reflux She complains of belching and heartburn. She reports no hoarse voice or no stridor. This is a chronic problem. The current episode started more than 1 year ago. The problem occurs rarely. The problem has been resolved. The symptoms are aggravated by certain foods. Associated symptoms include fatigue. She has tried a PPI for the symptoms. The treatment provided moderate relief.  Back Pain This is a chronic problem. The current episode started more than 1 year ago. The problem occurs constantly. The problem is unchanged. The pain is present in the  thoracic spine. The quality of the pain is described as aching. The pain is at a severity of 9/10. The pain is moderate. The symptoms are aggravated by twisting and bending. Associated symptoms include leg pain (left). Risk factors include obesity. She has tried analgesics and bed rest for the symptoms. The treatment provided moderate relief.  Arthritis Presents for follow-up visit. She complains of pain and stiffness. Affected locations include the left knee, right knee, left MCP and right MCP. Her pain is at a severity of 9/10. Associated symptoms include fatigue.  Insomnia Primary symptoms: difficulty falling asleep, frequent awakening, no malaise/fatigue.   The current episode started more than one year. The onset quality is gradual. The problem occurs intermittently. Past treatments include medication. The treatment provided moderate relief. PMH includes: depression.   Anxiety Presents for follow-up visit. Symptoms include depressed mood, excessive worry, insomnia, nervous/anxious behavior and restlessness. Patient reports no shortness of breath. Symptoms occur occasionally. The severity of symptoms is mild.    Depression        This is a chronic problem.  The current episode started more than 1 year ago.   The problem occurs intermittently.  Associated symptoms include fatigue, hopelessness, insomnia and restlessness.  Associated symptoms include no helplessness and not sad.  Past treatments include SSRIs - Selective serotonin reuptake inhibitors.  Past medical history includes anxiety.       Review of Systems  Constitutional:  Positive for fatigue. Negative for malaise/fatigue.  HENT:  Negative for hoarse voice.   Respiratory:  Negative for shortness of breath.   Gastrointestinal:  Positive for heartburn.  Musculoskeletal:  Positive for back pain and stiffness.  Psychiatric/Behavioral:  Positive for depression. The patient is nervous/anxious and has insomnia.   All other systems  reviewed and are negative.  Family History  Problem Relation Age of Onset   Hypertension Mother    Lung cancer Father    Hypertension Brother    Colon cancer Neg Hx    Gastric cancer Neg Hx    Esophageal cancer Neg Hx    Social History   Socioeconomic History   Marital status: Single    Spouse name: Not on file   Number of children: 1   Years of education: 12   Highest education level: Not on file  Occupational History   Occupation: Umemployed    Comment: Disability  Tobacco Use   Smoking status: Never   Smokeless tobacco: Never  Vaping Use   Vaping status: Never Used  Substance and Sexual Activity   Alcohol use: No   Drug use: No   Sexual activity: Not Currently    Birth control/protection: Surgical  Other Topics Concern   Not on file  Social History Narrative   Lives at home alone.   Disabled - speech difficulties, cannot drive   Right-handed.   Occasional caffeine use.   Her daughter lives about 30-45 minutes away   She has lots of family nearby - uncle, mother, sister, brother   Social Drivers of Corporate investment banker Strain: Low Risk  (12/31/2023)   Overall Financial Resource Strain (CARDIA)    Difficulty of Paying Living Expenses: Not hard at all  Food Insecurity: No Food Insecurity (01/17/2024)   Hunger Vital Sign    Worried About Running Out of Food in the Last Year: Never true    Ran Out of Food in the Last Year: Never true  Transportation Needs: No Transportation Needs (01/17/2024)   PRAPARE - Administrator, Civil Service (Medical): No    Lack of Transportation (Non-Medical): No  Physical Activity: Insufficiently Active (12/31/2023)   Exercise Vital Sign    Days of Exercise per Week: 2 days    Minutes of Exercise per Session: 30 min  Stress: No Stress Concern Present (12/31/2023)   [employer] of Occupational Health - Occupational Stress Questionnaire    Feeling of Stress: Not at all  Social Connections: Moderately Integrated  (12/31/2023)   Social Connection and Isolation Panel    Frequency of Communication with Friends and Family: More than three times a week    Frequency of Social Gatherings with Friends and Family: More than three times a week    Attends Religious Services: More than 4 times per year    Active Member of [employer] or Organizations: Yes    Attends Banker Meetings: Never    Marital Status: Never married       Objective:   Physical Exam Vitals reviewed.  Constitutional:      General: She is not in acute distress.    Appearance: She is well-developed.  HENT:     Head: Normocephalic and atraumatic.     Right Ear: Tympanic membrane normal.     Left Ear: Tympanic membrane normal.  Eyes:     Pupils: Pupils are equal, round, and reactive to light.  Neck:     Thyroid : No thyromegaly.  Cardiovascular:     Rate and Rhythm: Normal rate and regular rhythm.     Heart sounds: Normal heart sounds. No murmur heard. Pulmonary:     Effort: Pulmonary effort is normal. No respiratory distress.  Breath sounds: Normal breath sounds. No wheezing.  Abdominal:     General: Bowel sounds are normal. There is no distension.     Palpations: Abdomen is soft.     Tenderness: There is no abdominal tenderness.  Musculoskeletal:        General: Tenderness present. Normal range of motion.     Cervical back: Normal range of motion and neck supple.     Comments: Pain in thoracic area with flexion  Skin:    General: Skin is warm and dry.  Neurological:     Mental Status: She is alert and oriented to person, place, and time.     Cranial Nerves: No cranial nerve deficit.     Deep Tendon Reflexes: Reflexes are normal and symmetric.  Psychiatric:        Behavior: Behavior normal.        Thought Content: Thought content normal.        Judgment: Judgment normal.       BP 113/77   Pulse (!) 104   Temp (!) 97.1 F (36.2 C) (Temporal)   Ht 5' 7 (1.702 m)   Wt 202 lb (91.6 kg)   LMP 09/16/2017    SpO2 96%   BMI 31.64 kg/m      Assessment & Plan:  Brittany Durham comes in today with chief complaint of Medical Management of Chronic Issues   Diagnosis and orders addressed:  1. Essential (primary) hypertension - amLODipine  (NORVASC ) 10 MG tablet; Take 1 tablet (10 mg total) by mouth daily.  Dispense: 90 tablet; Refill: 1 - CMP14+EGFR - TSH  2. Depression, major, single episode, moderate (HCC) - buPROPion  (WELLBUTRIN  XL) 150 MG 24 hr tablet; Take 1 tablet (150 mg total) by mouth daily.  Dispense: 30 tablet; Refill: 5 - escitalopram  (LEXAPRO ) 20 MG tablet; Take 1 tablet (20 mg total) by mouth daily.  Dispense: 180 tablet; Refill: 1 - CMP14+EGFR  3. GAD (generalized anxiety disorder) - buPROPion  (WELLBUTRIN  XL) 150 MG 24 hr tablet; Take 1 tablet (150 mg total) by mouth daily.  Dispense: 30 tablet; Refill: 5 - escitalopram  (LEXAPRO ) 20 MG tablet; Take 1 tablet (20 mg total) by mouth daily.  Dispense: 180 tablet; Refill: 1 - CMP14+EGFR  4. PUD (peptic ulcer disease) - pantoprazole  (PROTONIX ) 40 MG tablet; TAKE ONE TABLET DAILY BEFORE BREAKFAST  Dispense: 90 tablet; Refill: 1 - CMP14+EGFR  5. Gastroesophageal reflux disease without esophagitis - CMP14+EGFR  6. Vitamin D  deficiency  - CMP14+EGFR - VITAMIN D  25 Hydroxy (Vit-D Deficiency, Fractures)  7. Language difficulty - CMP14+EGFR  8. Primary osteoarthritis involving multiple joints - CMP14+EGFR  9. Annual physical exam (Primary) - CMP14+EGFR - Lipid panel - TSH - VITAMIN D  25 Hydroxy (Vit-D Deficiency, Fractures)  10. Insomnia, unspecified type - CMP14+EGFR  11. S/P cervical spinal fusion  - CMP14+EGFR  12. Iron  deficiency anemia due to chronic blood loss - CMP14+EGFR   Labs pending Keep follow up with specialists  Continue current medications  Health Maintenance reviewed Diet and exercise encouraged  Follow up plan: 6 months    Bari Learn, FNP

## 2024-02-20 NOTE — Addendum Note (Signed)
 Addended by: VIKTORIA ALAN MATSU on: 02/20/2024 04:18 PM   Modules accepted: Orders

## 2024-02-21 ENCOUNTER — Ambulatory Visit: Payer: Self-pay | Admitting: Family

## 2024-02-21 LAB — LIPID PANEL
Chol/HDL Ratio: 3.2 ratio (ref 0.0–4.4)
Cholesterol, Total: 232 mg/dL — ABNORMAL HIGH (ref 100–199)
HDL: 72 mg/dL (ref >39)
LDL Chol Calc (NIH): 145 mg/dL — ABNORMAL HIGH (ref 0–99)
Triglycerides: 89 mg/dL (ref 0–149)
VLDL Cholesterol Cal: 15 mg/dL (ref 5–40)

## 2024-02-21 LAB — CMP14+EGFR
ALT: 25 IU/L (ref 0–32)
AST: 17 IU/L (ref 0–40)
Albumin: 4.5 g/dL (ref 3.8–4.9)
Alkaline Phosphatase: 95 IU/L (ref 49–135)
BUN/Creatinine Ratio: 14 (ref 9–23)
BUN: 11 mg/dL (ref 6–24)
Bilirubin Total: 0.4 mg/dL (ref 0.0–1.2)
CO2: 24 mmol/L (ref 20–29)
Calcium: 10 mg/dL (ref 8.7–10.2)
Chloride: 103 mmol/L (ref 96–106)
Creatinine, Ser: 0.77 mg/dL (ref 0.57–1.00)
Globulin, Total: 2.4 g/dL (ref 1.5–4.5)
Glucose: 88 mg/dL (ref 70–99)
Potassium: 3.9 mmol/L (ref 3.5–5.2)
Sodium: 142 mmol/L (ref 134–144)
Total Protein: 6.9 g/dL (ref 6.0–8.5)
eGFR: 91 mL/min/1.73 (ref >59)

## 2024-02-21 LAB — TSH: TSH: 1.87 u[IU]/mL (ref 0.450–4.500)

## 2024-02-21 LAB — VITAMIN D 25 HYDROXY (VIT D DEFICIENCY, FRACTURES): Vit D, 25-Hydroxy: 18.8 ng/mL — ABNORMAL LOW (ref 30.0–100.0)

## 2024-02-21 MED ORDER — VITAMIN D (ERGOCALCIFEROL) 1.25 MG (50000 UNIT) PO CAPS: 50000.0000 [IU] | ORAL_CAPSULE | ORAL | 3 refills | Status: DC

## 2024-02-24 DIAGNOSIS — M5417 Radiculopathy, lumbosacral region: Secondary | ICD-10-CM | POA: Diagnosis not present

## 2024-03-12 ENCOUNTER — Other Ambulatory Visit: Payer: Self-pay | Admitting: Family

## 2024-03-12 DIAGNOSIS — Z1231 Encounter for screening mammogram for malignant neoplasm of breast: Secondary | ICD-10-CM

## 2024-03-16 ENCOUNTER — Encounter: Payer: Self-pay | Admitting: Radiology

## 2024-03-23 ENCOUNTER — Encounter

## 2024-04-04 ENCOUNTER — Other Ambulatory Visit: Payer: Self-pay | Admitting: Family

## 2024-04-04 DIAGNOSIS — M546 Pain in thoracic spine: Secondary | ICD-10-CM

## 2024-04-20 ENCOUNTER — Ambulatory Visit
Admission: RE | Admit: 2024-04-20 | Discharge: 2024-04-20 | Disposition: A | Source: Ambulatory Visit | Attending: Family | Admitting: Family

## 2024-04-20 DIAGNOSIS — Z1231 Encounter for screening mammogram for malignant neoplasm of breast: Secondary | ICD-10-CM

## 2024-04-21 ENCOUNTER — Encounter (HOSPITAL_BASED_OUTPATIENT_CLINIC_OR_DEPARTMENT_OTHER): Payer: Self-pay | Admitting: Cardiology

## 2024-04-21 ENCOUNTER — Ambulatory Visit (INDEPENDENT_AMBULATORY_CARE_PROVIDER_SITE_OTHER): Admitting: Cardiology

## 2024-04-21 VITALS — BP 114/68 | HR 111 | Ht 67.0 in | Wt 206.0 lb

## 2024-04-21 DIAGNOSIS — R079 Chest pain, unspecified: Secondary | ICD-10-CM

## 2024-04-21 DIAGNOSIS — I10 Essential (primary) hypertension: Secondary | ICD-10-CM

## 2024-04-21 DIAGNOSIS — Z712 Person consulting for explanation of examination or test findings: Secondary | ICD-10-CM

## 2024-04-21 DIAGNOSIS — R072 Precordial pain: Secondary | ICD-10-CM

## 2024-04-21 DIAGNOSIS — R7989 Other specified abnormal findings of blood chemistry: Secondary | ICD-10-CM

## 2024-04-21 DIAGNOSIS — Z7189 Other specified counseling: Secondary | ICD-10-CM

## 2024-04-21 NOTE — Patient Instructions (Addendum)
 Medication Instructions:  No changes today *If you need a refill on your cardiac medications before your next appointment, please call your pharmacy*  Lab Work:   Testing/Procedures: Cardiac NM STRESS PET CT  Follow-Up: At [facility], you and your health needs are our priority.  As part of our continuing mission to provide you with exceptional heart care, our providers are all part of one team.  This team includes your primary Cardiologist (physician) and Advanced Practice Providers or APPs (Physician Assistants and Nurse Practitioners) who all work together to provide you with the care you need, when you need it.  Your next appointment:   3 month(s)  Provider:   Shelda Bruckner, MD       Please report to Radiology at the [facility] Main Entrance 30 minutes early for your test.  [address] [place], [place] [address]  How to Prepare for Your Cardiac PET/CT Stress Test:  Nothing to eat or drink, except water , 3 hours prior to arrival time.  NO caffeine/decaffeinated products, or chocolate 12 hours prior to arrival. (Please note decaffeinated beverages (teas/coffees) still contain caffeine).  If you have caffeine within 12 hours prior, the test will need to be rescheduled.  Medication instructions: Do not take erectile dysfunction medications for 72 hours prior to test (sildenafil, tadalafil) Do not take nitrates (isosorbide mononitrate, Ranexa) the day before or day of test Do not take tamsulosin the day before or morning of test Hold theophylline containing medications for 12 hours. Hold Dipyridamole 48 hours prior to the test.  Diabetic Preparation: If able to eat breakfast prior to 3 hour fasting, you may take all medications, including your insulin. Do not worry if you miss your breakfast dose of insulin - start at your next meal. If you do not eat prior to 3 hour fast-Hold all diabetes (oral and insulin) medications. Patients who wear a  continuous glucose monitor MUST remove the device prior to scanning.  You may take your remaining medications with water .  NO perfume, cologne or lotion on chest or abdomen area. FEMALES - Please avoid wearing dresses to this appointment.  Total time is 1 to 2 hours; you may want to bring reading material for the waiting time.  IF YOU THINK YOU MAY BE PREGNANT, OR ARE NURSING PLEASE INFORM THE TECHNOLOGIST.  In preparation for your appointment, medication and supplies will be purchased.  Appointment availability is limited, so if you need to cancel or reschedule, please call the Radiology Department Scheduler at [phone] 24 hours in advance to avoid a cancellation fee of $100.00  What to Expect When you Arrive:  Once you arrive and check in for your appointment, you will be taken to a preparation room within the Radiology Department.  A technologist or Nurse will obtain your medical history, verify that you are correctly prepped for the exam, and explain the procedure.  Afterwards, an IV will be started in your arm and electrodes will be placed on your skin for EKG monitoring during the stress portion of the exam. Then you will be escorted to the PET/CT scanner.  There, staff will get you positioned on the scanner and obtain a blood pressure and EKG.  During the exam, you will continue to be connected to the EKG and blood pressure machines.  A small, safe amount of a radioactive tracer will be injected in your IV to obtain a series of pictures of your heart along with an injection of a stress agent.  After your Exam:  It is recommended that you eat a meal and drink a caffeinated beverage to counter act any effects of the stress agent.  Drink plenty of fluids for the remainder of the day and urinate frequently for the first couple of hours after the exam.  Your doctor will inform you of your test results within 7-10 business days.  For more information and frequently asked questions, please  visit our website: [email or web address]  For questions about your test or how to prepare for your test, please call: Cardiac Imaging Nurse Navigators Office: [phone]

## 2024-04-21 NOTE — Progress Notes (Signed)
 Cardiology Office Note:  .   Date:  04/21/2024  ID:  Brittany Durham, DOB 1970/07/24, MRN [id number] PCP: Lavell Bari LABOR, FNP  [facility] Providers Cardiologist:  Shelda Bruckner, MD {  History of Present Illness: .   Brittany Durham is a 53 y.o. female with a hx of atypical chest pain, hypertension, speech disorder, who is seen for follow up. I initially saw her 01/29/19 as a new consult at the request of Lavell Bari LABOR, FNP for the evaluation and management of chest pain.   Cardiac history:  Long history of sharp atypical chest pain without clear triggers.   Today: At last visit 01/2024, she was having severe, acute chest pain. Brought her to ER, admitted. CTPE unremarkable, echo normal, hsTn were elevated at 82->79, appears this was discussed with cardiology DOD and not felt to be ACS. Notes comment on tenderness of chest wall to palpation, felt to be MSK.  She continued to have chest pain for about three days after she came home, but then it improved. Now having rare symptoms of chest pain similar to her prior, sharp, brief. No sustained pain.   Has had some vertigo when she lays in bed or turns her head.   ROS: Denies PND, orthopnea, LE edema or unexpected weight gain. No syncope or palpitations. ROS otherwise negative except as noted.   Studies Reviewed: SABRA    EKG:      Physical Exam:   VS:  BP 114/68   Pulse (!) 111   Ht 5' 7 (1.702 m)   Wt 206 lb (93.4 kg)   LMP 09/16/2017   SpO2 95%   BMI 32.26 kg/m    Wt Readings from Last 3 Encounters:  04/21/24 206 lb (93.4 kg)  02/20/24 202 lb (91.6 kg)  01/23/24 199 lb (90.3 kg)    GEN: Well nourished, well developed in no acute distress HEENT: Normal, moist mucous membranes NECK: No JVD CARDIAC: regular rhythm, normal S1 and S2, no rubs or gallops. No murmur. VASCULAR: Radial and DP pulses 2+ bilaterally. No carotid bruits RESPIRATORY:  Clear to auscultation without rales, wheezing or rhonchi  ABDOMEN: Soft,  non-tender, non-distended MUSCULOSKELETAL:  Ambulates independently SKIN: Warm and dry, no edema NEUROLOGIC:  Alert and oriented x 3. No focal neuro deficits noted. PSYCHIATRIC:  Normal affect    ASSESSMENT AND PLAN: .    Atypical chest pain history chronically, with acute pain 01/2024 Elevated troponins -has history of chronic pain, flares intermittently, no clear triggers -I am concerned that she had elevated hsTn at the time of her pain. I reviewed her test results with her today. While they did not uptrend, she does not have significant other factors that may be involved in this. Last stress test in 2020 without ischemia when coronary CT unable to be done.  -we discussed options. After shared decision making, will proceed with cardiac PET for further evaluation. Elevated heart rate chronically precludes coronary CT. -Instructed on red flag signs that need immediate medical attention.   Informed Consent   Shared Decision Making/Informed Consent{ The risks [chest pain, shortness of breath, cardiac arrhythmias, dizziness, blood pressure fluctuations, myocardial infarction, stroke/transient ischemic attack, nausea, vomiting, allergic reaction, radiation exposure, metallic taste sensation and life-threatening complications (estimated to be 1 in 10,000)], benefits (risk stratification, diagnosing coronary artery disease, treatment guidance) and alternatives of a cardiac PET stress test were discussed in detail with Ms. Coppolino and she agrees to proceed.       Hypertension -continue amlodipine   History of palpitations History of syncope -no recent symptoms  CV risk counseling and prevention -recommend heart healthy/Mediterranean diet, with whole grains, fruits, vegetable, fish, lean meats, nuts, and olive oil. Limit salt. -recommend moderate walking, 3-5 times/week for 30-50 minutes each session. Aim for at least 150 minutes/week. Goal should be pace of 3 miles/hours, or walking 1.5 miles in  30 minutes -recommend avoidance of tobacco products. Avoid excess alcohol. -ASCVD risk score: The 10-year ASCVD risk score (Arnett DK, et al., 2019) is: 2.9%   Values used to calculate the score:     Age: 63 years     Clincally relevant sex: Female     Is Non-Hispanic African American: Yes     Diabetic: No     Tobacco smoker: No     Systolic Blood Pressure: 114 mmHg     Is BP treated: Yes     HDL Cholesterol: 72 mg/dL     Total Cholesterol: 232 mg/dL    Dispo: if cardiac PET low risk, follow up in 3 mos  Signed, Shelda Bruckner, MD   Shelda Bruckner, MD, PhD, [facility]  [facility]  [place]  [facility] V[facility] at [facility] at [facility] [address], [address] [place], [place] [address] [phone]

## 2024-04-29 ENCOUNTER — Other Ambulatory Visit (HOSPITAL_COMMUNITY)

## 2024-05-05 ENCOUNTER — Inpatient Hospital Stay

## 2024-05-11 ENCOUNTER — Encounter: Payer: Self-pay | Admitting: *Deleted

## 2024-05-13 ENCOUNTER — Inpatient Hospital Stay: Admitting: Physician Assistant

## 2024-05-18 ENCOUNTER — Encounter (HOSPITAL_COMMUNITY): Payer: Self-pay

## 2024-05-19 ENCOUNTER — Telehealth (HOSPITAL_COMMUNITY): Payer: Self-pay | Admitting: Emergency Medicine

## 2024-05-19 NOTE — Telephone Encounter (Signed)
 Reaching out to patient to offer assistance regarding upcoming cardiac imaging study; pt verbalizes understanding of appt date/time, parking situation and where to check in, pre-test NPO status and medications ordered, and verified current allergies; name and call back number provided for further questions should they arise Rockwell Alexandria RN Navigator Cardiac Imaging [facility] [phone] office [phone] cell

## 2024-05-20 ENCOUNTER — Ambulatory Visit (HOSPITAL_COMMUNITY)
Admission: RE | Admit: 2024-05-20 | Discharge: 2024-05-20 | Disposition: A | Discharge status: Home / Self Care | Source: Ambulatory Visit | Attending: Cardiology | Admitting: Cardiology

## 2024-05-20 DIAGNOSIS — R7989 Other specified abnormal findings of blood chemistry: Secondary | ICD-10-CM | POA: Diagnosis present

## 2024-05-20 DIAGNOSIS — R079 Chest pain, unspecified: Secondary | ICD-10-CM | POA: Insufficient documentation

## 2024-05-20 DIAGNOSIS — R072 Precordial pain: Secondary | ICD-10-CM | POA: Insufficient documentation

## 2024-05-20 LAB — NM PET CT CARDIAC PERFUSION MULTI W/ABSOLUTE BLOODFLOW
LV dias vol: 116 mL (ref 46–106)
MBFR: 2.37
Nuc Rest EF: 50 %
Nuc Stress EF: 62 %
Peak HR: 114 {beats}/min
Rest HR: 103 {beats}/min
Rest MBF: 0.83 ml/g/min
Rest Nuclear Isotope Dose: 24.1 mCi
ST Depression (mm): 0 mm
Stress MBF: 1.97 ml/g/min
Stress Nuclear Isotope Dose: 24.1 mCi
TID: 0.96

## 2024-05-20 MED ORDER — RUBIDIUM RB82 GENERATOR (RUBYFILL)
24.1300 | PACK | Freq: Once | INTRAVENOUS | Status: AC
  Administered 2024-05-20: 24.13 via INTRAVENOUS

## 2024-05-20 MED ORDER — REGADENOSON 0.4 MG/5ML IV SOLN
INTRAVENOUS | Status: AC
  Filled 2024-05-20: qty 5

## 2024-05-20 MED ORDER — RUBIDIUM RB82 GENERATOR (RUBYFILL)
24.1500 | PACK | Freq: Once | INTRAVENOUS | Status: AC
  Administered 2024-05-20: 24.15 via INTRAVENOUS

## 2024-05-20 MED ORDER — REGADENOSON 0.4 MG/5ML IV SOLN
0.4000 mg | Freq: Once | INTRAVENOUS | Status: AC
  Administered 2024-05-20: 0.4 mg via INTRAVENOUS

## 2024-06-01 ENCOUNTER — Ambulatory Visit (HOSPITAL_BASED_OUTPATIENT_CLINIC_OR_DEPARTMENT_OTHER): Payer: Self-pay | Admitting: Cardiology

## 2024-06-01 ENCOUNTER — Other Ambulatory Visit: Payer: Self-pay

## 2024-06-01 ENCOUNTER — Encounter (HOSPITAL_COMMUNITY): Payer: Self-pay | Admitting: Emergency Medicine

## 2024-06-01 ENCOUNTER — Observation Stay (HOSPITAL_COMMUNITY)
Admission: EM | Admit: 2024-06-01 | Discharge: 2024-06-03 | Disposition: A | Attending: Hospitalist | Admitting: Hospitalist

## 2024-06-01 ENCOUNTER — Ambulatory Visit: Payer: Self-pay

## 2024-06-01 DIAGNOSIS — F418 Other specified anxiety disorders: Secondary | ICD-10-CM | POA: Diagnosis not present

## 2024-06-01 DIAGNOSIS — D509 Iron deficiency anemia, unspecified: Secondary | ICD-10-CM | POA: Diagnosis not present

## 2024-06-01 DIAGNOSIS — J449 Chronic obstructive pulmonary disease, unspecified: Secondary | ICD-10-CM | POA: Diagnosis not present

## 2024-06-01 DIAGNOSIS — K219 Gastro-esophageal reflux disease without esophagitis: Secondary | ICD-10-CM | POA: Diagnosis not present

## 2024-06-01 DIAGNOSIS — E876 Hypokalemia: Secondary | ICD-10-CM | POA: Insufficient documentation

## 2024-06-01 DIAGNOSIS — I10 Essential (primary) hypertension: Secondary | ICD-10-CM | POA: Diagnosis not present

## 2024-06-01 DIAGNOSIS — D649 Anemia, unspecified: Principal | ICD-10-CM | POA: Diagnosis present

## 2024-06-01 DIAGNOSIS — R4789 Other speech disturbances: Secondary | ICD-10-CM | POA: Insufficient documentation

## 2024-06-01 DIAGNOSIS — E669 Obesity, unspecified: Secondary | ICD-10-CM | POA: Diagnosis not present

## 2024-06-01 DIAGNOSIS — M1380 Other specified arthritis, unspecified site: Secondary | ICD-10-CM | POA: Insufficient documentation

## 2024-06-01 DIAGNOSIS — Z79899 Other long term (current) drug therapy: Secondary | ICD-10-CM | POA: Insufficient documentation

## 2024-06-01 DIAGNOSIS — R112 Nausea with vomiting, unspecified: Secondary | ICD-10-CM | POA: Diagnosis present

## 2024-06-01 DIAGNOSIS — R531 Weakness: Secondary | ICD-10-CM | POA: Diagnosis present

## 2024-06-01 DIAGNOSIS — D500 Iron deficiency anemia secondary to blood loss (chronic): Secondary | ICD-10-CM | POA: Diagnosis present

## 2024-06-01 DIAGNOSIS — F411 Generalized anxiety disorder: Secondary | ICD-10-CM | POA: Diagnosis present

## 2024-06-01 DIAGNOSIS — Z6831 Body mass index (BMI) 31.0-31.9, adult: Secondary | ICD-10-CM | POA: Diagnosis not present

## 2024-06-01 LAB — IRON AND TIBC
Iron: 12 ug/dL — ABNORMAL LOW (ref 28–170)
Saturation Ratios: 2 % — ABNORMAL LOW (ref 10.4–31.8)
TIBC: 483 ug/dL — ABNORMAL HIGH (ref 250–450)
UIBC: 471 ug/dL

## 2024-06-01 LAB — CBC WITH DIFFERENTIAL/PLATELET
Abs Immature Granulocytes: 0.04 K/uL (ref 0.00–0.07)
Basophils Absolute: 0 K/uL (ref 0.0–0.1)
Basophils Relative: 0 %
Eosinophils Absolute: 0 K/uL (ref 0.0–0.5)
Eosinophils Relative: 0 %
HCT: 17.6 % — ABNORMAL LOW (ref 36.0–46.0)
Hemoglobin: 4.7 g/dL — CL (ref 12.0–15.0)
Immature Granulocytes: 1 %
Lymphocytes Relative: 15 %
Lymphs Abs: 1.2 K/uL (ref 0.7–4.0)
MCH: 18.5 pg — ABNORMAL LOW (ref 26.0–34.0)
MCHC: 26.7 g/dL — ABNORMAL LOW (ref 30.0–36.0)
MCV: 69.3 fL — ABNORMAL LOW (ref 80.0–100.0)
Monocytes Absolute: 0.8 K/uL (ref 0.1–1.0)
Monocytes Relative: 11 %
Neutro Abs: 5.7 K/uL (ref 1.7–7.7)
Neutrophils Relative %: 73 %
Platelets: 320 K/uL (ref 150–400)
RBC: 2.54 MIL/uL — ABNORMAL LOW (ref 3.87–5.11)
RDW: 18.7 % — ABNORMAL HIGH (ref 11.5–15.5)
WBC: 7.8 K/uL (ref 4.0–10.5)
nRBC: 0.3 % — ABNORMAL HIGH (ref 0.0–0.2)

## 2024-06-01 LAB — URINALYSIS, ROUTINE W REFLEX MICROSCOPIC
Bilirubin Urine: NEGATIVE
Glucose, UA: NEGATIVE mg/dL
Hgb urine dipstick: NEGATIVE
Ketones, ur: NEGATIVE mg/dL
Nitrite: NEGATIVE
Protein, ur: 30 mg/dL — AB
Specific Gravity, Urine: 1.019 (ref 1.005–1.030)
pH: 5 (ref 5.0–8.0)

## 2024-06-01 LAB — COMPREHENSIVE METABOLIC PANEL WITH GFR
ALT: 18 U/L (ref 0–44)
AST: 19 U/L (ref 15–41)
Albumin: 4.4 g/dL (ref 3.5–5.0)
Alkaline Phosphatase: 81 U/L (ref 38–126)
Anion gap: 13 (ref 5–15)
BUN: 14 mg/dL (ref 6–20)
CO2: 24 mmol/L (ref 22–32)
Calcium: 9.5 mg/dL (ref 8.9–10.3)
Chloride: 102 mmol/L (ref 98–111)
Creatinine, Ser: 1.13 mg/dL — ABNORMAL HIGH (ref 0.44–1.00)
GFR, Estimated: 57 mL/min — ABNORMAL LOW
Glucose, Bld: 93 mg/dL (ref 70–99)
Potassium: 3.4 mmol/L — ABNORMAL LOW (ref 3.5–5.1)
Sodium: 140 mmol/L (ref 135–145)
Total Bilirubin: 0.3 mg/dL (ref 0.0–1.2)
Total Protein: 7.2 g/dL (ref 6.5–8.1)

## 2024-06-01 LAB — FERRITIN: Ferritin: 5 ng/mL — ABNORMAL LOW (ref 11–307)

## 2024-06-01 LAB — LIPASE, BLOOD: Lipase: 29 U/L (ref 11–51)

## 2024-06-01 LAB — RETICULOCYTES
Immature Retic Fract: 6.7 % (ref 2.3–15.9)
RBC.: 2.55 MIL/uL — ABNORMAL LOW (ref 3.87–5.11)
Retic Count, Absolute: 36.2 K/uL (ref 19.0–186.0)
Retic Ct Pct: 1.4 % (ref 0.4–3.1)

## 2024-06-01 LAB — PREPARE RBC (CROSSMATCH)

## 2024-06-01 LAB — FOLATE: Folate: 10.9 ng/mL

## 2024-06-01 LAB — VITAMIN B12: Vitamin B-12: 407 pg/mL (ref 180–914)

## 2024-06-01 MED ORDER — ONDANSETRON HCL 4 MG/2ML IJ SOLN
4.0000 mg | Freq: Once | INTRAMUSCULAR | Status: AC
  Administered 2024-06-01: 4 mg via INTRAVENOUS
  Filled 2024-06-01: qty 2

## 2024-06-01 MED ORDER — POTASSIUM CHLORIDE CRYS ER 20 MEQ PO TBCR
20.0000 meq | EXTENDED_RELEASE_TABLET | Freq: Once | ORAL | Status: AC
  Administered 2024-06-01: 20 meq via ORAL
  Filled 2024-06-01: qty 1

## 2024-06-01 MED ORDER — SODIUM CHLORIDE 0.9 % IV BOLUS
1000.0000 mL | Freq: Once | INTRAVENOUS | Status: AC
  Administered 2024-06-01: 1000 mL via INTRAVENOUS

## 2024-06-01 MED ORDER — SODIUM CHLORIDE 0.9% IV SOLUTION
Freq: Once | INTRAVENOUS | Status: AC
  Administered 2024-06-01: 1000 mL via INTRAVENOUS

## 2024-06-01 MED ORDER — SODIUM CHLORIDE 0.9% IV SOLUTION: Freq: Once | INTRAVENOUS | Status: AC

## 2024-06-01 NOTE — ED Notes (Signed)
 Assisted to restroom.

## 2024-06-01 NOTE — ED Triage Notes (Signed)
 Pt c/o generalized weakness, sob and emesis x 3 days.

## 2024-06-01 NOTE — ED Provider Notes (Signed)
 " [place] [facility] AT [facility] Provider Note   CSN: [id number] Arrival date & time: 06/01/24  2041     Patient presents with: Weakness   Brittany Durham is a 54 y.o. female.   Pt is a 54 yo female with pmhx significant for htn, seizures, hypothyroidism, GERD, and anemia.  Pt's sister has been in the hospital at [facility] and she's been under a lot of stress.  Pt said she's been vomiting all day.  She's not been able to keep down her meds or any fluids.  Pt did have a recent stress test (1/7) that was ok.  Pt said she's been very fatigued.  She denies f/c.  No cp.       Prior to Admission medications  Medication Sig Start Date End Date Taking? Authorizing Provider  amLODipine  (NORVASC ) 10 MG tablet Take 1 tablet (10 mg total) by mouth daily. 02/20/24   Lavell Bari LABOR, FNP  baclofen  (LIORESAL ) 10 MG tablet TAKE ONE TABLET THREE TIMES DAILY 04/06/24   Lavell Bari A, FNP  buPROPion  (WELLBUTRIN  XL) 150 MG 24 hr tablet Take 1 tablet (150 mg total) by mouth daily. 02/20/24   Lavell Bari LABOR, FNP  busPIRone  (BUSPAR ) 10 MG tablet Take 1 tablet (10 mg total) by mouth 3 (three) times daily as needed. 02/20/24   Lavell Bari A, FNP  celecoxib  (CELEBREX ) 200 MG capsule Take by mouth 2 (two) times daily. 10/08/23   [provider]  escitalopram  (LEXAPRO ) 20 MG tablet Take 1 tablet (20 mg total) by mouth daily. 02/20/24   Lavell Bari LABOR, FNP  pantoprazole  (PROTONIX ) 40 MG tablet TAKE ONE TABLET DAILY BEFORE BREAKFAST 02/20/24   Hawks, Christy A, FNP  Vitamin D , Ergocalciferol , (DRISDOL ) 1.25 MG (50000 UNIT) CAPS capsule Take 1 capsule (50,000 Units total) by mouth every 7 (seven) days. Patient not taking: Reported on 04/21/2024 02/21/24   Lavell Bari A, FNP    Allergies: Methylprednisolone  and Venofer  [iron  sucrose]    Review of Systems  Constitutional:  Positive for fatigue.  Gastrointestinal:  Positive for nausea and vomiting.  Neurological:  Positive for  weakness.  All other systems reviewed and are negative.   Updated Vital Signs BP 133/77   Pulse (!) 103   Temp 98.2 F (36.8 C) (Oral)   Resp 18   Ht 5' 7 (1.702 m)   Wt 93.4 kg   LMP 09/16/2017   SpO2 98%   BMI 32.25 kg/m   Physical Exam Vitals and nursing note reviewed.  Constitutional:      Appearance: Normal appearance.  HENT:     Head: Normocephalic and atraumatic.     Right Ear: External ear normal.     Left Ear: External ear normal.     Nose: Nose normal.     Mouth/Throat:     Mouth: Mucous membranes are moist.     Pharynx: Oropharynx is clear.  Eyes:     Extraocular Movements: Extraocular movements intact.     Conjunctiva/sclera: Conjunctivae normal.     Pupils: Pupils are equal, round, and reactive to light.  Cardiovascular:     Rate and Rhythm: Regular rhythm. Tachycardia present.     Pulses: Normal pulses.     Heart sounds: Normal heart sounds.  Pulmonary:     Effort: Pulmonary effort is normal.     Breath sounds: Normal breath sounds.  Abdominal:     General: Abdomen is flat. Bowel sounds are normal.     Palpations: Abdomen  is soft.  Genitourinary:    Rectum: Guaiac result negative.  Musculoskeletal:     Cervical back: Normal range of motion and neck supple.  Skin:    General: Skin is warm.     Capillary Refill: Capillary refill takes less than 2 seconds.  Neurological:     General: No focal deficit present.     Mental Status: She is alert and oriented to person, place, and time.  Psychiatric:        Mood and Affect: Affect is tearful.     (all labs ordered are listed, but only abnormal results are displayed) Labs Reviewed  CBC WITH DIFFERENTIAL/PLATELET - Abnormal; Notable for the following components:      Result Value   RBC 2.54 (*)    Hemoglobin 4.7 (*)    HCT 17.6 (*)    MCV 69.3 (*)    MCH 18.5 (*)    MCHC 26.7 (*)    RDW 18.7 (*)    nRBC 0.3 (*)    All other components within normal limits  COMPREHENSIVE METABOLIC PANEL WITH  GFR - Abnormal; Notable for the following components:   Potassium 3.4 (*)    Creatinine, Ser 1.13 (*)    GFR, Estimated 57 (*)    All other components within normal limits  RETICULOCYTES - Abnormal; Notable for the following components:   RBC. 2.55 (*)    All other components within normal limits  LIPASE, BLOOD  URINALYSIS, ROUTINE W REFLEX MICROSCOPIC  VITAMIN B12  FOLATE  IRON  AND TIBC  FERRITIN  POC OCCULT BLOOD, ED  PREPARE RBC (CROSSMATCH)  TYPE AND SCREEN    EKG: EKG Interpretation Date/Time:  Monday June 01 2024 20:58:48 EST Ventricular Rate:  109 PR Interval:  150 QRS Duration:  74 QT Interval:  323 QTC Calculation: 435 R Axis:   51  Text Interpretation: Sinus tachycardia Since last tracing rate faster Confirmed by Dean Clarity [phone]) on 06/01/2024 9:47:16 PM  Radiology: No results found.   Procedures   Medications Ordered in the ED  0.9 %  sodium chloride  infusion (Manually program via Guardrails IV Fluids) (has no administration in time range)  sodium chloride  0.9 % bolus 1,000 mL (1,000 mLs Intravenous New Bag/Given 06/01/24 2128)  ondansetron  (ZOFRAN ) injection 4 mg (4 mg Intravenous Given 06/01/24 2128)                                    Medical Decision Making Amount and/or Complexity of Data Reviewed Labs: ordered.  Risk Prescription drug management. Decision regarding hospitalization.   This patient presents to the ED for concern of weakness, n/v, this involves an extensive number of treatment options, and is a complaint that carries with it a high risk of complications and morbidity.  The differential diagnosis includes electrolyte abn, anemia, infection   Co morbidities that complicate the patient evaluation  htn, seizures, hypothyroidism, GERD, and anemia   Additional history obtained:  Additional history obtained from epic chart review External records from outside source obtained and reviewed including EMS report   Lab  Tests:  I Ordered, and personally interpreted labs.  The pertinent results include:  cbc with hgb 4.7 (9.0 on 9/6); cmp nl other than cr sl elevated at 1.13 (cr 0.77 in sept)   Cardiac Monitoring:  The patient was maintained on a cardiac monitor.  I personally viewed and interpreted the cardiac monitored which showed an underlying  rhythm of: st   Medicines ordered and prescription drug management:  I ordered medication including ivfs/transfusion  for sx  Reevaluation of the patient after these medicines showed that the patient improved I have reviewed the patients home medicines and have made adjustments as needed   Critical Interventions:  transfusion   Consultations Obtained:  I requested consultation with the hospitalist (Dr. Shona),  and discussed lab and imaging findings as well as pertinent plan - she will admit   Problem List / ED Course:  Symptomatic anemia:  pt's hgb is down to 4.7.  She is post-menopausal.  No black stool.  Guaiac neg.  She has a hx of iron  deficiency anemia.  Last iron  infusion in Sept.  She is extremely symptomatic from her hgb being low.  HR went up to 130s just moving in bed.  I think she will need admission for work up and transfusion.  Anemia panel pending.   Reevaluation:  After the interventions noted above, I reevaluated the patient and found that they have :improved   Social Determinants of Health:  Lives at home   Dispostion:  After consideration of the diagnostic results and the patients response to treatment, I feel that the patent would benefit from admission.  CRITICAL CARE Performed by: Mliss Boyers   Total critical care time: 30 minutes  Critical care time was exclusive of separately billable procedures and treating other patients.  Critical care was necessary to treat or prevent imminent or life-threatening deterioration.  Critical care was time spent personally by me on the following activities: development of  treatment plan with patient and/or surrogate as well as nursing, discussions with consultants, evaluation of patient's response to treatment, examination of patient, obtaining history from patient or surrogate, ordering and performing treatments and interventions, ordering and review of laboratory studies, ordering and review of radiographic studies, pulse oximetry and re-evaluation of patient's condition.        Final diagnoses:  Symptomatic anemia    ED Discharge Orders     None          Boyers Mliss, MD 06/01/24 2238  "

## 2024-06-01 NOTE — ED Notes (Signed)
Negative fecal occult

## 2024-06-01 NOTE — H&P (Incomplete)
 " History and Physical  Brittany Durham FMW:[id number] DOB: 1971-05-02 DOA: 06/01/2024  Referring physician: Dr. Dean, EDP  PCP: Lavell Bari LABOR, FNP  Outpatient Specialists: GI. Patient coming from: Home.  Chief Complaint: Generalized weakness and fatigue, shortness of breath, vomiting.  HPI: Brittany Durham is a 54 y.o. female with medical history significant for Ole lesions, multiple gastric erosions seen on EGD (09/20/2020), asthma, arthritis on Celebrex , chronic anxiety/depression, GERD, hypertension, speech impediment, who presents to the ER due to worsening generalized weakness and fatigue for the past 3 days.  Associated with shortness of breath worse with ambulation, nausea and vomiting x 3 on Saturday, x 2 on Sunday, and once today.  Denies having any abdominal pain or subjective fevers.  Denies having any overt bleeding.  She is postmenopausal and has not had any vaginal bleeds.  She has been taking Celebrex  twice a day for joint pain.  Currently on p.o. Protonix  40 mg daily.  Denies any anginal symptoms.  She presented to the ER for further evaluation.  In the ER, tachycardic 120 and tachypneic 22.  Lab studies notable for hemoglobin of 4.7 K and MCV of 69.3.  FOBT negative per EDP Dr. Dean.  1 unit PRBCs ordered to be transfused in the ER.  TRH, hospitalist service, was asked to admit.  ED Course: Temperature 98.  BP 130/81, pulse 96, respiratory 14, O2 saturation 98% on room air.  Review of Systems: Review of systems as noted in the HPI. All other systems reviewed and are negative.   Past Medical History:  Diagnosis Date   Anemia    Asthma    childhood   Carotidynia    COPD (chronic obstructive pulmonary disease) (HCC)    DDD (degenerative disc disease), cervical    GERD (gastroesophageal reflux disease)    Hypertension    Knee pain    left meniscus tear s/p repair 11/2014   Seizures (HCC)    last seizure was over 10 years ago, unknown etiology   Stuttering     patient reports this started after her knee surgery in 11/2014.    Thyroid  nodule    Past Surgical History:  Procedure Laterality Date   ANTERIOR CERVICAL DECOMP/DISCECTOMY FUSION N/A 03/20/2019   Procedure: Anterior Cervical Decompression Fusion - Cervical Four-Cervical Five - Cervical Five-Cervical Six;  Surgeon: Joshua Alm RAMAN, MD;  Location: [facility] OR;  Service: Neurosurgery;  Laterality: N/A;  Anterior Cervical Decompression Fusion - Cervical Four-Cervical Five - Cervical Five-Cervical Six   BIOPSY  09/20/2020   Procedure: BIOPSY;  Surgeon: Shaaron Lamar HERO, MD;  Location: [facility] SUITE;  Service: Endoscopy;;   COLONOSCOPY N/A 08/23/2016   Procedure: COLONOSCOPY;  Surgeon: Lamar HERO Shaaron, MD;  Location: [facility];  Service: Endoscopy;  Laterality: N/A;  1:00pm   COLONOSCOPY N/A 08/24/2020   Diverticulosis in the entire examined colon. Redundant colon. Exam otherwise normal   ESOPHAGOGASTRODUODENOSCOPY N/A 05/03/2016   Procedure: ESOPHAGOGASTRODUODENOSCOPY (EGD);  Surgeon: Lamar HERO Shaaron, MD;  Location: [facility];  Service: Endoscopy;  Laterality: N/A;   ESOPHAGOGASTRODUODENOSCOPY N/A 08/23/2016   Procedure: ESOPHAGOGASTRODUODENOSCOPY (EGD);  Surgeon: Lamar HERO Shaaron, MD;  Location: [facility];  Service: Endoscopy;  Laterality: N/A;   ESOPHAGOGASTRODUODENOSCOPY (EGD) WITH PROPOFOL  N/A 09/20/2020   normal esophagus. Large hiatal hernia. Multiple gastric erosions (Cameron lesions). Normal duodenal bulb and second portion of the duodenum. Suspicion Ole lesions causing intermittent bleeding   KNEE ARTHROSCOPY Right    KNEE ARTHROSCOPY WITH MEDIAL MENISECTOMY Left 12/09/2014  Procedure: KNEE ARTHROSCOPY WITH PARTIAL MEDIAL AND LATERAL MENISECTOMY;  Surgeon: Lemond Stable, MD;  Location: [facility] ORS;  Service: Orthopedics;  Laterality: Left;   TUBAL LIGATION     WISDOM TOOTH EXTRACTION      Social History:  reports that she has never smoked. She has never used smokeless tobacco. She  reports that she does not drink alcohol and does not use drugs.   Allergies[1]  Family History  Problem Relation Age of Onset   Hypertension Mother    Lung cancer Father    Breast cancer Sister    Hypertension Brother    Colon cancer Neg Hx    Gastric cancer Neg Hx    Esophageal cancer Neg Hx       Prior to Admission medications  Medication Sig Start Date End Date Taking? Authorizing Provider  amLODipine  (NORVASC ) 10 MG tablet Take 1 tablet (10 mg total) by mouth daily. 02/20/24   Lavell Bari LABOR, FNP  baclofen  (LIORESAL ) 10 MG tablet TAKE ONE TABLET THREE TIMES DAILY 04/06/24   Lavell Bari A, FNP  buPROPion  (WELLBUTRIN  XL) 150 MG 24 hr tablet Take 1 tablet (150 mg total) by mouth daily. 02/20/24   Lavell Bari LABOR, FNP  busPIRone  (BUSPAR ) 10 MG tablet Take 1 tablet (10 mg total) by mouth 3 (three) times daily as needed. 02/20/24   Lavell Bari A, FNP  celecoxib  (CELEBREX ) 200 MG capsule Take by mouth 2 (two) times daily. 10/08/23   [provider]  escitalopram  (LEXAPRO ) 20 MG tablet Take 1 tablet (20 mg total) by mouth daily. 02/20/24   Lavell Bari LABOR, FNP  pantoprazole  (PROTONIX ) 40 MG tablet TAKE ONE TABLET DAILY BEFORE BREAKFAST 02/20/24   Hawks, Christy A, FNP  Vitamin D , Ergocalciferol , (DRISDOL ) 1.25 MG (50000 UNIT) CAPS capsule Take 1 capsule (50,000 Units total) by mouth every 7 (seven) days. Patient not taking: Reported on 04/21/2024 02/21/24   Lavell Bari LABOR, FNP    Physical Exam: BP 133/77   Pulse (!) 103   Temp 98.2 F (36.8 C) (Oral)   Resp 18   Ht 5' 7 (1.702 m)   Wt 93.4 kg   LMP 09/16/2017   SpO2 98%   BMI 32.25 kg/m   General: 54 y.o. year-old female well developed well nourished in no acute distress.  Alert and oriented x3. Cardiovascular: Regular rate and rhythm with no rubs or gallops.  No thyromegaly or JVD noted.  No lower extremity edema. 2/4 pulses in all 4 extremities. Respiratory: Clear to auscultation with no wheezes or rales.  Good inspiratory effort. Abdomen: Soft nontender nondistended with normal bowel sounds x4 quadrants. Muskuloskeletal: No cyanosis, clubbing or edema noted bilaterally Neuro: CN II-XII intact, strength, sensation, reflexes Skin: No ulcerative lesions noted or rashes Psychiatry: Judgement and insight appear normal. Mood is appropriate for condition and setting          Labs on Admission:  Basic Metabolic Panel: Recent Labs  Lab 06/01/24 2105  NA 140  K 3.4*  CL 102  CO2 24  GLUCOSE 93  BUN 14  CREATININE 1.13*  CALCIUM 9.5   Liver Function Tests: Recent Labs  Lab 06/01/24 2105  AST 19  ALT 18  ALKPHOS 81  BILITOT 0.3  PROT 7.2  ALBUMIN 4.4   Recent Labs  Lab 06/01/24 2105  LIPASE 29   No results for input(s): AMMONIA in the last 168 hours. CBC: Recent Labs  Lab 06/01/24 2105  WBC 7.8  NEUTROABS 5.7  HGB 4.7*  HCT 17.6*  MCV 69.3*  PLT 320   Cardiac Enzymes: No results for input(s): CKTOTAL, CKMB, CKMBINDEX, TROPONINI in the last 168 hours.  BNP (last 3 results) No results for input(s): BNP in the last 8760 hours.  ProBNP (last 3 results) No results for input(s): PROBNP in the last 8760 hours.  CBG: No results for input(s): GLUCAP in the last 168 hours.  Radiological Exams on Admission: No results found.  EKG: I independently viewed the EKG done and my findings are as followed: Sinus tachycardia 109.  QTc 435.  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 Shaaron, 09/20/2020 Presented with generalized weakness and fatigue, shortness of breath, worse with ambulation. Hemoglobin 4.7 with MCV of 69. FOBT negative in the ER, per EDP. Total of 3 units PRBCs ordered to be transfused The patient states she has an appointment at [facility] to get an endoscopy, EGD, on  06/08/2024.  Advised to keep the appointment and to proceed with EGD. IV PPI Protonix  twice daily Follow-up iron  studies  Suspected acute on chronic blood loss anemia History of multiple gastric erosions History of chronic NSAID use Hemoglobin 12.8 on 10/02/2023, since then her hemoglobin has been downtrending. Follow repeat CBC post blood transfusions  Arthritis Avoid NSAIDs Hold off home Celebrex  As needed analgesics  Hypokalemia Serum potassium 3.4 Repleted orally.  Check magnesium level. Repeat BMP in the morning  Chronic anxiety/depression Stable Resume home regimen.  Hypertension BPs are currently soft Hold off home oral antihypertensive Closely monitor vital signs.  Obesity BMI 31 Recommend weight loss outpatient with regular physical activity and healthy dieting.  History of asthma No acute issues.  GERD Emesis Continue IV PPI Antiemetics as needed Diet as tolerated  Speech impediment This is patient's baseline   Time: 75 minutes.   DVT prophylaxis: SCDs.  Code Status: Full code.  Family Communication: The patient's daughter at bedside.  Disposition Plan: Admitted to stepdown unit.  Consults called: None.  Admission status: Observation status.   Status is: Observation    Terry LOISE Hurst MD [facility] Pager [phone]  If 7PM-7AM, please contact night-coverage [email or web address] Password [email or web address]  06/01/2024, 11:03 PM      [1]  Allergies Allergen Reactions   Methylprednisolone  Itching   Venofer  [Iron  Sucrose] Rash   "

## 2024-06-01 NOTE — Telephone Encounter (Signed)
 FYI Only or Action Required?: FYI only for provider: appointment scheduled on 1/20.  Patient was last seen in primary care on 02/20/2024 by Lavell Bari LABOR, FNP.  Called Nurse Triage reporting Emesis.  Symptoms began several days ago.  Interventions attempted: Rest, hydration, or home remedies.  Symptoms are: gradually worsening.  Triage Disposition: See Physician Within 24 Hours  Patient/caregiver understands and will follow disposition?: Yes    Emesis with fatigue for 3 days. No CP. Throwing up 2-3x/day with yellow emesis. Diarrhea x2 today. No blood. No abd pain. No fever. Scheduled appt with different provider at home office tomorrow d/t no PCP availability within timeframe. Advised UC or ED for worsening symptoms.     Message from Wanchese G sent at 06/01/2024  5:52 PM EST  Reason for Triage: Patient has been experiencing  extreme fatigue and throwing up yellow stuff for a few days now.   Reason for Disposition  [1] MILD or MODERATE vomiting AND [2] present > 48 hours (2 days)  (Exception: Mild vomiting with associated diarrhea.)  Answer Assessment - Initial Assessment Questions 1. VOMITING SEVERITY: How many times have you vomited in the past 24 hours?      2-3x/day  2. ONSET: When did the vomiting begin?      3 days ago  3. FLUIDS: What fluids or food have you vomited up today? Have you been able to keep any fluids down?     Drinking plenty of water , keeping most of it down  4. ABDOMEN PAIN: Are your having any abdomen pain? If Yes : How bad is it and what does it feel like? (e.g., crampy, dull, intermittent, constant)      Denies  5. DIARRHEA: Is there any diarrhea? If Yes, ask: How many times today?      Yes. 2x/today  6. CONTACTS: Is there anyone else in the family with the same symptoms?      Denies  7. CAUSE: What do you think is causing your vomiting?     Unsure  8. HYDRATION STATUS: Any signs of dehydration? (e.g., dry mouth [not  only dry lips], too weak to stand) When did you last urinate?     Dry mouth  9. OTHER SYMPTOMS: Do you have any other symptoms? (e.g., fever, headache, vertigo, vomiting blood or coffee grounds, recent head injury)     Fatigue  Protocols used: Vomiting-A-AH

## 2024-06-02 ENCOUNTER — Ambulatory Visit: Admitting: Family Medicine

## 2024-06-02 DIAGNOSIS — D649 Anemia, unspecified: Secondary | ICD-10-CM | POA: Diagnosis not present

## 2024-06-02 LAB — CBC
HCT: 27.3 % — ABNORMAL LOW (ref 36.0–46.0)
Hemoglobin: 8 g/dL — ABNORMAL LOW (ref 12.0–15.0)
MCH: 21.6 pg — ABNORMAL LOW (ref 26.0–34.0)
MCHC: 29.3 g/dL — ABNORMAL LOW (ref 30.0–36.0)
MCV: 73.6 fL — ABNORMAL LOW (ref 80.0–100.0)
Platelets: 272 K/uL (ref 150–400)
RBC: 3.71 MIL/uL — ABNORMAL LOW (ref 3.87–5.11)
RDW: 19.3 % — ABNORMAL HIGH (ref 11.5–15.5)
WBC: 8 K/uL (ref 4.0–10.5)
nRBC: 0.3 % — ABNORMAL HIGH (ref 0.0–0.2)

## 2024-06-02 LAB — BASIC METABOLIC PANEL WITH GFR
Anion gap: 11 (ref 5–15)
BUN: 9 mg/dL (ref 6–20)
CO2: 26 mmol/L (ref 22–32)
Calcium: 9.3 mg/dL (ref 8.9–10.3)
Chloride: 104 mmol/L (ref 98–111)
Creatinine, Ser: 0.83 mg/dL (ref 0.44–1.00)
GFR, Estimated: >60 mL/min
Glucose, Bld: 91 mg/dL (ref 70–99)
Potassium: 4 mmol/L (ref 3.5–5.1)
Sodium: 141 mmol/L (ref 135–145)

## 2024-06-02 LAB — HEMOGLOBIN AND HEMATOCRIT, BLOOD
HCT: 24.9 % — ABNORMAL LOW (ref 36.0–46.0)
Hemoglobin: 7.3 g/dL — ABNORMAL LOW (ref 12.0–15.0)

## 2024-06-02 LAB — PHOSPHORUS: Phosphorus: 3.7 mg/dL (ref 2.5–4.6)

## 2024-06-02 LAB — MRSA NEXT GEN BY PCR, NASAL: MRSA by PCR Next Gen: NOT DETECTED

## 2024-06-02 LAB — MAGNESIUM: Magnesium: 2.1 mg/dL (ref 1.7–2.4)

## 2024-06-02 MED ORDER — ACETAMINOPHEN 325 MG PO TABS: 650.0000 mg | ORAL_TABLET | Freq: Four times a day (QID) | ORAL | Status: DC | PRN

## 2024-06-02 MED ORDER — ACETAMINOPHEN 500 MG PO CAPS: 1000.0000 mg | ORAL_CAPSULE | Freq: Four times a day (QID) | ORAL | Status: AC | PRN

## 2024-06-02 MED ORDER — MELATONIN 3 MG PO TABS: 6.0000 mg | ORAL_TABLET | Freq: Every evening | ORAL | Status: DC | PRN

## 2024-06-02 MED ORDER — PANTOPRAZOLE SODIUM 40 MG IV SOLR
40.0000 mg | Freq: Two times a day (BID) | INTRAVENOUS | Status: DC
  Administered 2024-06-02 – 2024-06-03 (×3): 40 mg via INTRAVENOUS
  Filled 2024-06-02 (×3): qty 10

## 2024-06-02 MED ORDER — PROCHLORPERAZINE EDISYLATE 10 MG/2ML IJ SOLN: 5.0000 mg | Freq: Four times a day (QID) | INTRAMUSCULAR | Status: DC | PRN

## 2024-06-02 MED ORDER — POLYETHYLENE GLYCOL 3350 17 G PO PACK: 17.0000 g | PACK | Freq: Every day | ORAL | Status: DC | PRN

## 2024-06-02 MED ORDER — ORAL CARE MOUTH RINSE: 15.0000 mL | OROMUCOSAL | Status: DC | PRN

## 2024-06-02 MED ORDER — BUPROPION HCL ER (XL) 150 MG PO TB24
150.0000 mg | ORAL_TABLET | Freq: Every day | ORAL | Status: DC
  Administered 2024-06-02 – 2024-06-03 (×2): 150 mg via ORAL
  Filled 2024-06-02 (×2): qty 1

## 2024-06-02 MED ORDER — CHLORHEXIDINE GLUCONATE CLOTH 2 % EX PADS
6.0000 | MEDICATED_PAD | Freq: Every day | CUTANEOUS | Status: DC
  Administered 2024-06-02: 6 via TOPICAL

## 2024-06-02 MED ORDER — ESCITALOPRAM OXALATE 10 MG PO TABS
20.0000 mg | ORAL_TABLET | Freq: Every day | ORAL | Status: DC
  Administered 2024-06-02 – 2024-06-03 (×2): 20 mg via ORAL
  Filled 2024-06-02 (×2): qty 2

## 2024-06-02 NOTE — Plan of Care (Signed)

## 2024-06-02 NOTE — Progress Notes (Signed)
 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  No abdominal pain. Was found to be tachycardic in the ER heart rate 120s, tachypneic 22.  Laboratory studies notable for hemoglobin of 4.7, MCV 69.  FOBT negative in the ER.  Patient has received total of 3 unit PRBC transfusion, hemoglobin improved to 8.  No clinical bleeding noted. Patient admitted for further management.   Assessment & Plan:   Principal Problem:   Symptomatic anemia    Symptomatic anemia History of Cameron lesions with multiple gastric erosions seen on EGD by Dr. Shaaron, 09/20/2020 Generalized weakness, fatigue, shortness of breath, hemoglobin 4.7 on admission, improved to 8 after 3 unit PRBC transfusion.  This was rechecked later in the afternoon and it is 7.3.  No active bleeding noted. Repeat hemoglobin in the a.m. Iron  deficiency noted.  Will infuse IV iron  while inpatient.  Of note patient has allergies and will need to premedicate.  Will hold off on inpatient GI consult. The patient states she has an appointment at [facility] to get an endoscopy, EGD, on 06/08/2024.  Advised to keep the appointment and to proceed with EGD. IV PPI Protonix  twice daily   Suspected acute on chronic blood loss anemia History of multiple gastric erosions History of chronic NSAID use Recommend avoiding NSAIDs.  Arthritis Avoid NSAIDs Hold off home Celebrex  As needed analgesics   Hypokalemia Serum potassium 3.4 Repleted orally.  Check magnesium  level. Repeat BMP in the morning   Chronic anxiety/depression Stable Resume home regimen.   Hypertension BPs are currently soft Hold off home oral antihypertensive Closely monitor vital signs.   Obesity BMI 31 Recommend weight loss outpatient with regular physical activity and healthy dieting.   History of asthma No acute issues.   GERD Emesis Continue IV PPI Antiemetics as needed Diet as tolerated   Speech impediment This is patient's baseline      DVT prophylaxis: SCDs.   Code Status: Full code.   Family Communication: The patient's daughter at bedside.   Disposition Plan: transfer to med surg.  Potentially discharge to home 1/21 if hemoglobin does not drop further.   Consults called: None.   Admission status: Observation status.     Status is: Observation     Subjective:  Patient evaluated at the bedside.  Daughter present.  Patient denies any abdominal pain, willing to eat today.  Hemoglobin improved to 8 after 3 units transfusion.  Hemoglobin was later rechecked in the afternoon and it is down to 7.3.  No bleeding noted so far.  Objective: Vitals:   06/02/24 1400 06/02/24 1500 06/02/24 1600 06/02/24 1617  BP: 130/78 (!) 140/80    Pulse: 97 92 93 91  Resp:  16 15 17   Temp:    (!) 97.4 F (36.3 C)  TempSrc:    Oral  SpO2: 98% 97% 96% 94%  Weight:      Height:        Intake/Output Summary (Last 24 hours) at 06/02/2024 1651 Last data filed at 06/02/2024 1200 Gross per 24 hour  Intake 1310.67 ml  Output --  Net 1310.67 ml   Filed Weights   06/01/24 2046 06/02/24 0147  Weight: 93.4 kg 91.5 kg    Examination:  General exam: Appears calm and comfortable  Respiratory system: Bilateral decreased breath sounds at bases Cardiovascular system: S1 & S2 heard, Rate controlled Gastrointestinal system: Abdomen is nondistended, soft and nontender. Normal bowel sounds heard. Extremities: No cyanosis, clubbing, edema  Central nervous system: Alert and  oriented. No focal neurological deficits. Moving extremities Skin: No rashes, lesions or ulcers Psychiatry: Judgement and insight appear normal. Mood & affect appropriate.     Data Reviewed: I have personally reviewed following labs and imaging studies  CBC: Recent Labs  Lab 06/01/24 2105 06/02/24 0705 06/02/24 1440  WBC 7.8 8.0  --   NEUTROABS 5.7  --   --   HGB 4.7* 8.0* 7.3*  HCT 17.6* 27.3* 24.9*  MCV 69.3* 73.6*  --   PLT 320 272  --    Basic Metabolic Panel: Recent Labs  Lab 06/01/24 2105 06/02/24 0705  NA 140 141  K 3.4* 4.0  CL 102 104  CO2 24 26  GLUCOSE 93 91  BUN 14 9  CREATININE 1.13* 0.83  CALCIUM 9.5 9.3  MG  --  2.1  PHOS  --  3.7   GFR: Estimated Creatinine Clearance: 89 mL/min (by C-G formula based on SCr of 0.83 mg/dL). Liver Function Tests: Recent Labs  Lab 06/01/24 2105  AST 19  ALT 18  ALKPHOS 81  BILITOT 0.3  PROT 7.2  ALBUMIN 4.4   Recent Labs  Lab 06/01/24 2105  LIPASE 29   No results for input(s): AMMONIA in the last 168 hours. Coagulation Profile: No results for input(s): INR, PROTIME in the last 168 hours. Cardiac Enzymes: No results for input(s): CKTOTAL, CKMB, CKMBINDEX, TROPONINI in the last 168 hours. BNP (last 3 results) No results for input(s): PROBNP in the last 8760 hours. HbA1C: No results for input(s): HGBA1C in the last 72 hours. CBG: No results for input(s): GLUCAP in the last 168 hours. Lipid Profile: No results for input(s): CHOL, HDL, LDLCALC, TRIG, CHOLHDL, LDLDIRECT in the last 72 hours. Thyroid  Function Tests: No results for input(s): TSH, T4TOTAL, FREET4, T3FREE, THYROIDAB in the last 72 hours. Anemia Panel: Recent Labs    06/01/24 2105 06/01/24 2226  VITAMINB12 407  --   FOLATE  --  10.9  FERRITIN 5*  --   TIBC 483*  --   IRON  12*  --   RETICCTPCT 1.4  --    Sepsis Labs: No results for input(s): PROCALCITON, LATICACIDVEN in the last 168  hours.  Recent Results (from the past 240 hours)  MRSA Next Gen by PCR, Nasal     Status: None   Collection Time: 06/02/24  1:42 AM   Specimen: Nasal Mucosa; Nasal Swab  Result Value Ref Range Status   MRSA by PCR Next Gen NOT DETECTED NOT DETECTED Final    Comment: (NOTE) The GeneXpert MRSA Assay (FDA approved for NASAL specimens only), is one component of a comprehensive MRSA colonization surveillance program. It is not intended to diagnose MRSA infection nor to guide or monitor treatment for MRSA infections. Test performance is not FDA approved in patients less than 33 years old. Performed at [facility], [address] [place], [place] [address]          Radiology Studies: No results found.      Scheduled Meds:  buPROPion   150 mg Oral Daily   Chlorhexidine  Gluconate Cloth  6 each Topical Q0600   escitalopram   20 mg Oral Daily   pantoprazole  (PROTONIX ) IV  40 mg Intravenous BID   Continuous Infusions:        Wilena Tyndall, MD [facility] 06/02/2024, 4:51 PM   "

## 2024-06-02 NOTE — ED Notes (Addendum)
 Transported to ICU 1.

## 2024-06-02 NOTE — Care Management Obs Status (Signed)
 MEDICARE OBSERVATION STATUS NOTIFICATION   Patient Details  Name: Brittany Durham MRN: [id number] Date of Birth: [birth year]   Medicare Observation Status Notification Given:  Yes    Duwaine LITTIE Ada 06/02/2024, 4:27 PM

## 2024-06-02 NOTE — Progress Notes (Signed)
" °   06/02/24 0714  TOC Brief Assessment  Insurance and Status Reviewed  Patient has primary care physician Yes  Home environment has been reviewed From home  Prior level of function: Independent  Prior/Current Home Services No current home services  Social Drivers of Health Review SDOH reviewed no interventions necessary  Readmission risk has been reviewed Yes  Transition of care needs no transition of care needs at this time   Inpatient Care Management (ICM) has reviewed patient and no other ICM needs have been identified at this time. We will continue to monitor patient advancement through interdisciplinary progression rounds. If new patient transition needs arise, please place a ICM consult. "

## 2024-06-02 NOTE — Telephone Encounter (Unsigned)
 Copied from CRM [id number]. Topic: Appointments - Appointment Cancel/Reschedule >> Jun 02, 2024  8:08 AM Miquel SAILOR wrote: Patient/patient representative is calling to cancel or reschedule an appointment. Refer to attachments for appointment information. >> Jun 02, 2024  8:16 AM Miquel SAILOR wrote: PT daughter Rona Stated cancelled app due to PT has been taken to ER and is still there now. Also PCP in ER stated during medication review he was not too happy with her medication list. Daughter request call back for this issue , [phone]

## 2024-06-02 NOTE — Telephone Encounter (Signed)
 Appt made

## 2024-06-02 NOTE — ED Notes (Signed)
 Report given to receiving RN.

## 2024-06-02 NOTE — Discharge Summary (Signed)
 Physician Discharge Summary  Brittany Durham FMW:[id number] DOB: 16-Nov-1970 DOA: 06/01/2024  PCP: Lavell Bari LABOR, FNP  Admit date: 06/01/2024 Discharge date:06/03/2024 Admitted From: Home Disposition: Home  Recommendations for Outpatient Follow-up:  Follow up with PCP in 1 week with repeat CBC/BMP Follow-up with outpatient GI for scheduled endoscopy Follow-up with hematology for regular iron  infusion. 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  No abdominal pain. Was found to be tachycardic in the ER heart rate 120s, tachypneic 22.  Laboratory studies notable for hemoglobin of 4.7, MCV 69.  FOBT negative in the ER.  Patient has received total of 3 unit PRBC transfusion, hemoglobin improved to 8.  No clinical bleeding noted. Patient admitted for further management.     Assessment & Plan:     Symptomatic anemia History of Ole lesions with multiple gastric erosions seen on EGD by Dr. Shaaron, 09/20/2020 - No clinical bleeding. Patient received total of 3 unit blood transfusion hemoglobin appropriately up to 7.8. Iron  deficiency noted which has remained persistently low.   Patient was given a dose of IV iron  with premedication given allergies prior to discharge. The patient states she has an appointment at [facility] to get an endoscopy, EGD, on 06/08/2024.  Advised to keep the appointment and to proceed with EGD. IV PPI Protonix   twice daily   Suspected acute on chronic blood loss anemia History of multiple gastric erosions History of chronic NSAID use Recommend avoiding NSAIDs. Follows up with hematology and receives periodic iron  infusion.  Continue f/u   Arthritis Avoid NSAIDs Hold off home Celebrex  As needed analgesics   Hypokalemia Serum potassium 3.4 Repleted orally.  Check magnesium level. Repeat BMP in the morning   Chronic anxiety/depression Stable Resume home regimen.   Hypertension Resume home medications  Obesity BMI 31 Recommend weight loss outpatient with regular physical activity and healthy dieting.   History of asthma No acute issues.   GERD Emesis  Protonix  Speech impediment This is patient's baseline    Discharge Diagnoses:  Principal Problem:   Symptomatic anemia Active Problems:   Essential (primary) hypertension   Gastroesophageal reflux disease without esophagitis   Nausea with vomiting   GAD (generalized anxiety disorder)   Iron  deficiency anemia due to chronic blood loss    Discharge Instructions  Discharge Instructions     Discharge instructions   Complete by: As directed    1.  Continue pantoprazole .  Discontinue Celebrex . 2.  Follow-up with your outpatient gastroenterologist for scheduled endoscopy 1/26 3.  Return to the ER if tarry stool, red stools, dizziness 4.  Continue to follow-up with hematology   Increase activity slowly   Complete by: As directed       Allergies as of 06/03/2024       Reactions   Methylprednisolone  Itching   Venofer  [iron  Sucrose] Rash        Medication List     STOP taking these medications    celecoxib  200 MG capsule Commonly known as: CELEBREX        TAKE these  medications    Acetaminophen  500 MG capsule Take 2 capsules (1,000 mg total) by mouth every 6 (six) hours as needed for pain or fever. What changed: how much to take   amLODipine  10 MG tablet Commonly known as: NORVASC  Take 1 tablet (10  mg total) by mouth daily.   baclofen  10 MG tablet Commonly known as: LIORESAL  TAKE ONE TABLET THREE TIMES DAILY   buPROPion  150 MG 24 hr tablet Commonly known as: WELLBUTRIN  XL Take 1 tablet (150 mg total) by mouth daily.   busPIRone  10 MG tablet Commonly known as: BUSPAR  Take 1 tablet (10 mg total) by mouth 3 (three) times daily as needed.   escitalopram  20 MG tablet Commonly known as: LEXAPRO  Take 1 tablet (20 mg total) by mouth daily.   pantoprazole  40 MG tablet Commonly known as: PROTONIX  TAKE ONE TABLET DAILY BEFORE BREAKFAST        Follow-up Information     Lavell Bari LABOR, FNP. Schedule an appointment as soon as possible for a visit in 1 week(s).   Specialty: Family Medicine Contact information: [address] [place] [address] [phone]                Allergies[1]  Consultations:    Procedures/Studies: NM PET CT CARDIAC PERFUSION MULTI W/ABSOLUTE BLOODFLOW Result Date: 05/20/2024   LV perfusion is normal. There is no evidence of ischemia. There is no evidence of infarction.   Rest left ventricular function is normal. Rest EF: 50%. Stress left ventricular function is normal. Stress EF: 62%. End diastolic cavity size is normal.   Myocardial blood flow was computed to be 0.72ml/g/min at rest and 1.97ml/g/min at stress. Global myocardial blood flow reserve was 2.37 and was normal. Baseline MBF was adjusted for HR/BP.   Coronary calcium was absent on the attenuation correction CT images.   The study is normal. The study is low risk.   Electronically signed by Darryle Decent, MD CLINICAL DATA:  This over-read does not include interpretation of cardiac or coronary anatomy or pathology. The cardiac PET-CT interpretation by the cardiologist is attached. COMPARISON:  CTA on 01/17/2024 FINDINGS: No suspicious nodules, masses, or infiltrates are identified in the visualized portion of the lungs. No pleural fluid seen. The visualized portions of the mediastinum  and chest wall are unremarkable. Large hiatal hernia is again seen. IMPRESSION: Large hiatal hernia. Electronically Signed   By: Norleen LABOR Kil M.D.   On: 05/20/2024 11:40     Subjective:   Discharge Exam: Vitals:   06/03/24 0453 06/03/24 1309  BP: (!) 144/87 (!) 129/90  Pulse: 99 (!) 103  Resp: 18 18  Temp: 97.7 F (36.5 C) 97.9 F (36.6 C)  SpO2: 93% 96%    General: Pt is alert, awake, not in acute distress Cardiovascular: rate controlled, S1/S2 + Respiratory: bilateral decreased breath sounds at bases Abdominal: Soft, NT, ND, bowel sounds + Extremities: no edema, no cyanosis    The results of significant diagnostics from this hospitalization (including imaging, microbiology, ancillary and laboratory) are listed below for reference.     Microbiology: Recent Results (from the past 240 hours)  MRSA Next Gen by PCR, Nasal     Status: None   Collection Time: 06/02/24  1:42 AM   Specimen: Nasal Mucosa; Nasal Swab  Result Value Ref Range Status   MRSA by PCR Next Gen NOT DETECTED NOT DETECTED Final    Comment: (NOTE) The GeneXpert MRSA Assay (FDA approved for NASAL specimens only), is one component of  a comprehensive MRSA colonization surveillance program. It is not intended to diagnose MRSA infection nor to guide or monitor treatment for MRSA infections. Test performance is not FDA approved in patients less than 53 years old. Performed at [facility], [address], [place], [place] [address]      Labs: BNP (last 3 results) No results for input(s): BNP in the last 8760 hours. Basic Metabolic Panel: Recent Labs  Lab 06/01/24 2105 06/02/24 0705  NA 140 141  K 3.4* 4.0  CL 102 104  CO2 24 26  GLUCOSE 93 91  BUN 14 9  CREATININE 1.13* 0.83  CALCIUM 9.5 9.3  MG  --  2.1  PHOS  --  3.7   Liver Function Tests: Recent Labs  Lab 06/01/24 2105  AST 19  ALT 18  ALKPHOS 81  BILITOT 0.3  PROT 7.2  ALBUMIN 4.4   Recent Labs  Lab 06/01/24 2105  LIPASE  29   No results for input(s): AMMONIA in the last 168 hours. CBC: Recent Labs  Lab 06/01/24 2105 06/02/24 0705 06/02/24 1440 06/03/24 0443  WBC 7.8 8.0  --   --   NEUTROABS 5.7  --   --   --   HGB 4.7* 8.0* 7.3* 7.8*  HCT 17.6* 27.3* 24.9* 27.3*  MCV 69.3* 73.6*  --   --   PLT 320 272  --   --    Cardiac Enzymes: No results for input(s): CKTOTAL, CKMB, CKMBINDEX, TROPONINI in the last 168 hours. BNP: Invalid input(s): POCBNP CBG: No results for input(s): GLUCAP in the last 168 hours. D-Dimer No results for input(s): DDIMER in the last 72 hours. Hgb A1c No results for input(s): HGBA1C in the last 72 hours. Lipid Profile No results for input(s): CHOL, HDL, LDLCALC, TRIG, CHOLHDL, LDLDIRECT in the last 72 hours. Thyroid  function studies No results for input(s): TSH, T4TOTAL, T3FREE, THYROIDAB in the last 72 hours.  Invalid input(s): FREET3 Anemia work up Recent Labs    06/01/24 2105 06/01/24 2226  VITAMINB12 407  --   FOLATE  --  10.9  FERRITIN 5*  --   TIBC 483*  --   IRON  12*  --   RETICCTPCT 1.4  --    Urinalysis    Component Value Date/Time   COLORURINE YELLOW 06/01/2024 2230   APPEARANCEUR HAZY (A) 06/01/2024 2230   APPEARANCEUR Clear 07/26/2022 1116   LABSPEC 1.019 06/01/2024 2230   PHURINE 5.0 06/01/2024 2230   GLUCOSEU NEGATIVE 06/01/2024 2230   HGBUR NEGATIVE 06/01/2024 2230   BILIRUBINUR NEGATIVE 06/01/2024 2230   BILIRUBINUR Negative 07/26/2022 1116   KETONESUR NEGATIVE 06/01/2024 2230   PROTEINUR 30 (A) 06/01/2024 2230   UROBILINOGEN 0.2 12/12/2014 2127   NITRITE NEGATIVE 06/01/2024 2230   LEUKOCYTESUR TRACE (A) 06/01/2024 2230   Sepsis Labs Recent Labs  Lab 06/01/24 2105 06/02/24 0705  WBC 7.8 8.0   Microbiology Recent Results (from the past 240 hours)  MRSA Next Gen by PCR, Nasal     Status: None   Collection Time: 06/02/24  1:42 AM   Specimen: Nasal Mucosa; Nasal Swab  Result Value Ref Range  Status   MRSA by PCR Next Gen NOT DETECTED NOT DETECTED Final    Comment: (NOTE) The GeneXpert MRSA Assay (FDA approved for NASAL specimens only), is one component of a comprehensive MRSA colonization surveillance program. It is not intended to diagnose MRSA infection nor to guide or monitor treatment for MRSA infections. Test performance is not FDA approved in  patients less than 89 years old. Performed at [facility], [address] [place], [place] [address]      Time coordinating discharge: 35 minutes  SIGNED:   Derryl Duval, MD  [facility] 06/03/2024, 3:37 PM       [1]  Allergies Allergen Reactions   Methylprednisolone  Itching   Venofer  [Iron  Sucrose] Rash

## 2024-06-03 DIAGNOSIS — D649 Anemia, unspecified: Secondary | ICD-10-CM | POA: Diagnosis not present

## 2024-06-03 LAB — HEMOGLOBIN AND HEMATOCRIT, BLOOD
HCT: 27.3 % — ABNORMAL LOW (ref 36.0–46.0)
Hemoglobin: 7.8 g/dL — ABNORMAL LOW (ref 12.0–15.0)

## 2024-06-03 LAB — TYPE AND SCREEN
ABO/RH(D): A POS
Antibody Screen: NEGATIVE
Unit division: 0
Unit division: 0
Unit division: 0

## 2024-06-03 LAB — BPAM RBC
Blood Product Expiration Date: 202602012359
Blood Product Unit Number: 202602012359
Blood Product Unit Number: 202602152359
ISSUE DATE / TIME: 202601200237
PRODUCT CODE: 202601200509
PRODUCT CODE: 202602152359
PRODUCT CODE: 202602152359
Unit Type and Rh: 202601200013
Unit Type and Rh: 202602152359
Unit Type and Rh: 6200
Unit Type and Rh: 6200
Unit Type and Rh: 6200
Unit Type and Rh: 6200

## 2024-06-03 MED ORDER — CETIRIZINE HCL 10 MG/ML IV SOLN
10.0000 mg | Freq: Once | INTRAVENOUS | Status: AC
  Administered 2024-06-03: 10 mg via INTRAVENOUS
  Filled 2024-06-03: qty 1

## 2024-06-03 MED ORDER — ACETAMINOPHEN 325 MG PO TABS
650.0000 mg | ORAL_TABLET | Freq: Once | ORAL | Status: AC
  Administered 2024-06-03: 650 mg via ORAL
  Filled 2024-06-03: qty 2

## 2024-06-03 MED ORDER — SODIUM CHLORIDE 0.9 % IV SOLN
510.0000 mg | Freq: Once | INTRAVENOUS | Status: AC
  Administered 2024-06-03: 510 mg via INTRAVENOUS
  Filled 2024-06-03: qty 17

## 2024-06-03 MED ORDER — FAMOTIDINE IN NACL 20-0.9 MG/50ML-% IV SOLN
20.0000 mg | Freq: Once | INTRAVENOUS | Status: AC
  Administered 2024-06-03: 20 mg via INTRAVENOUS
  Filled 2024-06-03: qty 50

## 2024-06-03 MED ORDER — METHYLPREDNISOLONE SODIUM SUCC 125 MG IJ SOLR
125.0000 mg | Freq: Once | INTRAMUSCULAR | Status: AC
  Administered 2024-06-03: 125 mg via INTRAVENOUS
  Filled 2024-06-03: qty 2

## 2024-06-03 NOTE — Plan of Care (Signed)
  Problem: Clinical Measurements: Goal: Ability to maintain clinical measurements within normal limits will improve Outcome: Progressing Goal: Will remain free from infection Outcome: Progressing Goal: Diagnostic test results will improve Outcome: Progressing Goal: Respiratory complications will improve Outcome: Progressing Goal: Cardiovascular complication will be avoided Outcome: Progressing   Problem: Coping: Goal: Level of anxiety will decrease Outcome: Progressing   Problem: Safety: Goal: Ability to remain free from injury will improve Outcome: Progressing   Problem: Skin Integrity: Goal: Risk for impaired skin integrity will decrease Outcome: Progressing

## 2024-06-04 ENCOUNTER — Telehealth: Payer: Self-pay

## 2024-06-04 NOTE — Transitions of Care (Post Inpatient/ED Visit) (Signed)
" ° °  06/04/2024  Name: Brittany Durham MRN: [id number] DOB: Oct 26, 1970  Today's TOC FU Call Status: Today's TOC FU Call Status:: Successful TOC FU Call Completed TOC FU Call Complete Date: 06/04/24  Patient's Name and Date of Birth confirmed. Name, DOB  Transition Care Management Follow-up Telephone Call Date of Discharge: 06/03/24 Dis[facility] Type of Discharge: Inpatient Admission Primary Inpatient Discharge Diagnosis:: anemia How have you been since you were released from the hospital?: Better Any questions or concerns?: No  Items Reviewed: Did you receive and understand the discharge instructions provided?: Yes Medications obtained,verified, and reconciled?: Yes (Medications Reviewed) Any new allergies since your discharge?: No Dietary orders reviewed?: Yes Do you have support at home?: No  Medications Reviewed Today: Medications Reviewed Today     Reviewed by Emmitt Pan, LPN (Licensed Practical Nurse) on 06/04/24 at 1204  Med List Status: <None>   Medication Order Taking? Sig Documenting Provider Last Dose Status Informant  Acetaminophen  500 MG capsule [id number] Yes Take 2 capsules (1,000 mg total) by mouth every 6 (six) hours as needed for pain or fever. Sigdel, Santosh, MD  Active   amLODipine  (NORVASC ) 10 MG tablet [id number] Yes Take 1 tablet (10 mg total) by mouth daily. Lavell Lye A, FNP  Active   baclofen  (LIORESAL ) 10 MG tablet [id number] Yes TAKE ONE TABLET THREE TIMES DAILY Lavell Lye A, FNP  Active   buPROPion  (WELLBUTRIN  XL) 150 MG 24 hr tablet [id number] Yes Take 1 tablet (150 mg total) by mouth daily. Lavell Lye A, FNP  Active   busPIRone  (BUSPAR ) 10 MG tablet [id number] Yes Take 1 tablet (10 mg total) by mouth 3 (three) times daily as needed. Lavell Lye A, FNP  Active   escitalopram  (LEXAPRO ) 20 MG tablet [id number] Yes Take 1 tablet (20 mg total) by mouth daily. Lavell Lye LABOR, FNP  Active   pantoprazole  (PROTONIX ) 40 MG  tablet [id number] Yes TAKE ONE TABLET DAILY BEFORE BREAKFAST Hawks, Lye LABOR, FNP  Active             Home Care and Equipment/Supplies: Were Home Health Services Ordered?: NA Any new equipment or medical supplies ordered?: NA  Functional Questionnaire: Do you need assistance with bathing/showering or dressing?: No Do you need assistance with meal preparation?: No Do you need assistance with eating?: No Do you have difficulty maintaining continence: No Do you need assistance with getting out of bed/getting out of a chair/moving?: No Do you have difficulty managing or taking your medications?: No  Follow up appointments reviewed: PCP Follow-up appointment confirmed?: Yes Date of PCP follow-up appointment?: 06/09/24 Follow-up Provider: [facility] Follow-up appointment confirmed?: No Reason Specialist Follow-Up Not Confirmed: Patient has Specialist Provider Number and will Call for Appointment Do you need transportation to your follow-up appointment?: No Do you understand care options if your condition(s) worsen?: Yes-patient verbalized understanding    SIGNATURE Pan Emmitt, LPN [facility] Nurse Health Advisor Direct Dial [phone]  "

## 2024-06-09 ENCOUNTER — Telehealth: Payer: Self-pay | Admitting: Family

## 2024-06-09 ENCOUNTER — Inpatient Hospital Stay

## 2024-06-09 NOTE — Telephone Encounter (Signed)
 Called to inform patient that Bari will not be in the office on 06/09/24, needs to r/s with another provider or for another day

## 2024-06-17 ENCOUNTER — Encounter: Payer: Self-pay | Admitting: Family Medicine

## 2024-06-17 ENCOUNTER — Ambulatory Visit: Admitting: Family Medicine

## 2024-06-17 VITALS — BP 120/78 | HR 110 | Temp 98.1°F | Ht 67.0 in | Wt 199.2 lb

## 2024-06-17 DIAGNOSIS — D500 Iron deficiency anemia secondary to blood loss (chronic): Secondary | ICD-10-CM

## 2024-06-17 DIAGNOSIS — D649 Anemia, unspecified: Secondary | ICD-10-CM

## 2024-06-17 DIAGNOSIS — E876 Hypokalemia: Secondary | ICD-10-CM

## 2024-06-17 DIAGNOSIS — Z09 Encounter for follow-up examination after completed treatment for conditions other than malignant neoplasm: Secondary | ICD-10-CM

## 2024-06-17 DIAGNOSIS — I10 Essential (primary) hypertension: Secondary | ICD-10-CM

## 2024-06-17 LAB — CBC WITH DIFFERENTIAL/PLATELET

## 2024-06-17 NOTE — Progress Notes (Signed)
 "  Established Patient Office Visit  Subjective   Patient ID: Brittany Durham, female    DOB: 09/25/1970  Age: 54 y.o. MRN: [id number]  Chief Complaint  Patient presents with   hospitialization follow up    HPI  History of Present Illness   Brittany Durham is a 54 year old female who presents for a hospital follow-up for symptomatic anemia.  Today's visit was for Transitional Care Management.  The patient was discharged from [facility] on 06/03/24 with a primary diagnosis of symptomatic anemia.   Contact with the patient and/or caregiver, by a clinical staff member, was made on 06/04/24 and was documented as a telephone encounter within the EMR.  Through chart review and discussion with the patient I have determined that management of their condition is of moderate complexity.    Anemia and associated symptoms - Admitted January 19th to 21st for symptomatic anemia with hemoglobin level of 4.7 g/dL. - During admission, experienced tachycardia (heart rate 122 bpm) and tachypnea. - Received three units of blood transfusion, hemoglobin increased to 8 g/dL, then trended down to 7.8 g/dL prior to discharge. - Received IV iron  during hospitalization. - Previously experienced extreme fatigue, weakness, lack of energy, and excessive sleep without eating, prompting to her to go to the ER. - No bleeding in stool or melena. - No dizziness, lightheadedness, palpitations, or shortness of breath. - Significant improvement in symptoms post-discharge, currently feeling 'wonderful' with improved appetite and no fatigue or weakness. - Recalls feeling cold and having aching knees during anemia episode.  Gastrointestinal evaluation and management - Endoscopy was scheduled for January 26th but postponed due to snow; rescheduled for July. - Started on Protonix  and discontinued Celebrex .  Hematology follow-up - Has hematologist for iron  infusions. - Next hematology appointment scheduled for next  month.  Electrolyte abnormality - Potassium noted to be low. - Request magnesium level per discharge summary  Musculoskeletal symptoms - History of back problems with three bulging discs. - Awaiting back surgery.        ROS As per HPI.    Objective:     BP 120/78   Pulse (!) 110   Temp 98.1 F (36.7 C) (Temporal)   Ht 5' 7 (1.702 m)   Wt 199 lb 3.2 oz (90.4 kg)   LMP 09/16/2017   SpO2 95%   BMI 31.20 kg/m    Physical Exam Vitals and nursing note reviewed.  Constitutional:      General: She is not in acute distress.    Appearance: Normal appearance. She is not ill-appearing, toxic-appearing or diaphoretic.  Cardiovascular:     Rate and Rhythm: Regular rhythm. Tachycardia present.     Pulses: Normal pulses.     Heart sounds: Normal heart sounds. No murmur heard. Pulmonary:     Effort: Pulmonary effort is normal. No respiratory distress.     Breath sounds: Normal breath sounds.  Abdominal:     General: Bowel sounds are normal. There is no distension.     Palpations: Abdomen is soft. There is no mass.     Tenderness: There is no abdominal tenderness. There is no guarding or rebound.  Musculoskeletal:     Cervical back: No rigidity.     Right lower leg: No edema.     Left lower leg: No edema.  Skin:    General: Skin is warm and dry.  Neurological:     General: No focal deficit present.     Mental Status: She is alert and oriented  to person, place, and time.  Psychiatric:        Mood and Affect: Mood normal.        Behavior: Behavior normal.      No results found for any visits on 06/17/24.    The 10-year ASCVD risk score (Arnett DK, et al., 2019) is: 3.5%    Assessment & Plan:   Brittany Durham was seen today for hospitialization follow up.  Diagnoses and all orders for this visit:  Symptomatic anemia -     CBC with Differential/Platelet  Iron  deficiency anemia due to chronic blood loss  Hypokalemia -     BMP8+EGFR -      Magnesium  HYPERTENSION, BENIGN  Hospital discharge follow-up -     CBC with Differential/Platelet -     BMP8+EGFR -     Magnesium    Assessment and Plan    Symptomatic anemia Severe anemia treated with transfusion. Hemoglobin improved but slightly decreased before discharge. Currently asymptomatic with improved energy. Awaiting endoscopy.  - Ordered blood work for hemoglobin, potassium, and magnesium. - Follow up with GI.  - continue protonix . Celebrex  d/c. - Maintain hematology appointments for iron  infusions.  Hypokalemia Potassium levels require monitoring post-hospitalization. - Ordered blood work for potassium levels.     HTN BP at goal.   Hospital follow up Reviewed discharge summary, labs.   Return to office for new or worsening symptoms, or if symptoms persist.    Brittany CHRISTELLA Search, FNP "

## 2024-06-18 ENCOUNTER — Ambulatory Visit: Payer: Self-pay | Admitting: Family Medicine

## 2024-06-18 LAB — CBC WITH DIFFERENTIAL/PLATELET
Basos: 0
EOS (ABSOLUTE): 0 10*3/uL (ref 0.0–0.2)
Eos: 2
Hematocrit: 34.6 (ref 34.0–46.6)
Hemoglobin: 10.6 g/dL — AB (ref 11.1–15.9)
Immature Granulocytes: 0
Immature Granulocytes: 0 10*3/uL (ref 0.0–0.1)
Lymphs: 24
MCH: 24 pg — AB (ref 26.6–33.0)
MCHC: 30.6 g/dL — AB (ref 31.5–35.7)
MCV: 78 fL — AB (ref 79–97)
Monocytes Absolute: 0.1 10*3/uL (ref 0.0–0.4)
Monocytes Absolute: 0.5 10*3/uL (ref 0.1–0.9)
Monocytes: 8
Neutrophils Absolute: 1.4 10*3/uL (ref 0.7–3.1)
Neutrophils Absolute: 4 10*3/uL (ref 1.4–7.0)
Neutrophils: 66
Platelets: 343 10*3/uL (ref 150–450)
RBC: 4.42 x10E6/uL (ref 3.77–5.28)
RDW: 25.9 — AB (ref 11.7–15.4)
WBC: 6 10*3/uL (ref 3.4–10.8)

## 2024-06-18 LAB — BMP8+EGFR
BUN/Creatinine Ratio: 12 (ref 9–23)
BUN: 11 mg/dL (ref 6–24)
CO2: 23 mmol/L (ref 20–29)
Calcium: 10.2 mg/dL (ref 8.7–10.2)
Chloride: 102 mmol/L (ref 96–106)
Creatinine, Ser: 0.89 mg/dL (ref 0.57–1.00)
Glucose: 88 mg/dL (ref 70–99)
Potassium: 3.9 mmol/L (ref 3.5–5.2)
Sodium: 140 mmol/L (ref 134–144)
eGFR: 77 mL/min/{1.73_m2}

## 2024-06-18 LAB — MAGNESIUM: Magnesium: 2 mg/dL (ref 1.6–2.3)

## 2024-06-18 NOTE — Telephone Encounter (Unsigned)
 Copied from CRM [id number]. Topic: Clinical - Lab/Test Results >> Jun 18, 2024 11:16 AM Susanna ORN wrote: Reason for CRM: Patient returning call to [facility]. Read lab results per chart note to patient.

## 2024-07-27 ENCOUNTER — Inpatient Hospital Stay

## 2024-07-30 ENCOUNTER — Ambulatory Visit (HOSPITAL_BASED_OUTPATIENT_CLINIC_OR_DEPARTMENT_OTHER): Admitting: Cardiology

## 2024-08-03 ENCOUNTER — Inpatient Hospital Stay: Admitting: Physician Assistant

## 2024-08-20 ENCOUNTER — Ambulatory Visit: Payer: Self-pay | Admitting: Family

## 2024-12-31 ENCOUNTER — Ambulatory Visit: Payer: Self-pay
